# Patient Record
Sex: Male | Born: 1935 | Hispanic: No | Smoking: Never smoker
Health system: Southern US, Community
[De-identification: ages and names within clinical notes are randomized; demographics above are authoritative.]

## PROBLEM LIST (undated history)

## (undated) DIAGNOSIS — M1A9XX0 Chronic gout, unspecified, without tophus (tophi): Secondary | ICD-10-CM

## (undated) DIAGNOSIS — M353 Polymyalgia rheumatica: Secondary | ICD-10-CM

## (undated) DIAGNOSIS — I509 Heart failure, unspecified: Secondary | ICD-10-CM

## (undated) DIAGNOSIS — I10 Essential (primary) hypertension: Secondary | ICD-10-CM

## (undated) DIAGNOSIS — Z794 Long term (current) use of insulin: Secondary | ICD-10-CM

## (undated) DIAGNOSIS — E782 Mixed hyperlipidemia: Secondary | ICD-10-CM

## (undated) DIAGNOSIS — Z7952 Long term (current) use of systemic steroids: Secondary | ICD-10-CM

## (undated) DIAGNOSIS — E118 Type 2 diabetes mellitus with unspecified complications: Secondary | ICD-10-CM

## (undated) DIAGNOSIS — M170 Bilateral primary osteoarthritis of knee: Secondary | ICD-10-CM

## (undated) DIAGNOSIS — N184 Chronic kidney disease, stage 4 (severe): Secondary | ICD-10-CM

## (undated) DIAGNOSIS — E119 Type 2 diabetes mellitus without complications: Secondary | ICD-10-CM

## (undated) HISTORY — PX: ABDOMINAL SURGERY: SHX537

## (undated) HISTORY — DX: Polymyalgia rheumatica: M35.3

## (undated) HISTORY — DX: Mixed hyperlipidemia: E78.2

## (undated) HISTORY — DX: Long term (current) use of systemic steroids: Z79.52

## (undated) HISTORY — DX: Long term (current) use of insulin: Z79.4

## (undated) HISTORY — PX: CHOLECYSTECTOMY: SHX55

## (undated) HISTORY — DX: Chronic kidney disease, stage 4 (severe): N18.4

## (undated) HISTORY — DX: Chronic gout, unspecified, without tophus (tophi): M1A.9XX0

## (undated) HISTORY — DX: Bilateral primary osteoarthritis of knee: M17.0

## (undated) HISTORY — DX: Type 2 diabetes mellitus with unspecified complications: E11.8

## (undated) HISTORY — DX: Heart failure, unspecified: I50.9

## (undated) HISTORY — DX: Essential (primary) hypertension: I10

---

## 1999-10-08 ENCOUNTER — Ambulatory Visit: Admit: 1999-10-08 | Disposition: A | Discharge status: Home / Self Care | Payer: Self-pay | Source: Ambulatory Visit

## 2000-12-19 ENCOUNTER — Ambulatory Visit: Admit: 2000-12-19 | Disposition: A | Discharge status: Home / Self Care | Payer: Self-pay | Source: Ambulatory Visit

## 2002-08-08 ENCOUNTER — Ambulatory Visit
Admission: AD | Admit: 2002-08-08 | Disposition: A | Discharge status: Home / Self Care | Payer: Self-pay | Source: Ambulatory Visit

## 2005-08-26 ENCOUNTER — Ambulatory Visit
Admission: AD | Admit: 2005-08-26 | Disposition: A | Discharge status: Home / Self Care | Payer: Self-pay | Source: Ambulatory Visit

## 2005-08-29 LAB — COMPREHENSIVE METABOLIC PANEL
ALT: 17 U/L (ref 7–56)
AST (SGOT): 16 U/L (ref 5–40)
Albumin, Synovial: 4.2 g/dL (ref 3.9–5.0)
Alkaline Phosphatase: 92 U/L (ref 38–126)
BUN / Creatinine Ratio: 17 (ref 8–20)
BUN: 16 mg/dL (ref 6–20)
Bilirubin, Total: 0.2 mg/dL (ref 0.2–1.3)
CO2: 26 mmol/L (ref 21.0–31.0)
Calcium: 9.6 mg/dL (ref 8.4–10.2)
Chloride: 102 mmol/L (ref 101–111)
Creatinine: 0.99 mg/dL (ref 0.5–1.4)
EGFR: >60 mL/min/{1.73_m2}
EGFR: >60 mL/min/{1.73_m2}
Glucose: 189 mg/dL — ABNORMAL HIGH (ref 70–100)
Potassium: 4.6 mmol/L (ref 3.6–5.0)
Protein, Total: 8 g/dL (ref 6.3–8.2)
Sodium: 139 mmol/L (ref 135–145)

## 2005-08-29 LAB — URINALYSIS
Bilirubin, UA: NEGATIVE
Blood, UA: NEGATIVE
Glucose, UA: NEGATIVE
Ketones UA: NEGATIVE
Leukocyte Esterase, UA: NEGATIVE
Nitrate: NEGATIVE
Protein, UR: NEGATIVE
Specific Gravity, UR: 1.012 (ref 1.000–1.035)
Urobilinogen, UA: NORMAL
pH, Urine: 5.5 (ref 5.0–8.0)

## 2005-08-29 LAB — MICROALBUMIN, URINE, 24 HOUR
Creatinine, UR - mg/dL: 55 mg/dL
Microalbumin - mg/dL: 1.9 mg/dL
Microalbumin/Creatinine Ratio: 35 mg/g — ABNORMAL HIGH (ref 0–30)

## 2005-08-29 LAB — ^CBC WITH DIFF MCKESSON
BASOPHILS %: 0.5 % (ref 0–2)
Baso(Absolute): 0
Eosinophils %: 6.2 % — ABNORMAL HIGH (ref 0–6)
Eosinophils Absolute: 0.4
Hematocrit: 43 % (ref 39.0–49.0)
Hemoglobin: 14.6 g/dL (ref 13.2–17.3)
Lymphocytes Absolute: 2.4
Lymphocytes Relative: 34.8 % (ref 25–55)
MCH: 30.8 pg (ref 27.0–34.0)
MCHC: 34 % (ref 32.0–36.0)
MCV: 90.6 fL (ref 80–100)
Monocytes Absolute: 0.4
Monocytes Relative %: 5.8 % (ref 1–8)
Neutrophils Absolute: 3.6
Neutrophils Relative %: 52.7 % (ref 49–69)
Platelets: 220 10*3/uL (ref 150–400)
RBC: 4.74 /mm3 (ref 3.80–5.40)
RDW: 12.8 % (ref 11.0–14.0)
WBC: 6.8 10*3/uL (ref 4.8–10.8)

## 2005-08-29 LAB — ^LIPID PROFILE MCKESSON
Cholesterol: 172 mg/dL (ref 140–200)
Coronary Heart Disease Risk: 3.2 (ref 1.0–6.5)
HDL Cholesterol,  Direct: 54 mg/dL (ref 40–200)
LDL: 89 mg/dL (ref 66–178)
Triglycerides: 143 mg/dL — ABNORMAL HIGH (ref 27–125)
VLDL: 29 mg/dL (ref 2–38)

## 2005-08-29 LAB — ^GLYCOHEMOGLOBIN MCKESSON: Hemoglobin A1C: 11.6 % — ABNORMAL HIGH (ref 4.0–6.0)

## 2005-08-29 LAB — BILIRUBIN, DIRECT: Bilirubin Direct: 0 mg/dL (ref 0.0–0.4)

## 2005-08-29 LAB — SEDIMENTATION RATE, AUTOMATED: Sed Rate: 23 — ABNORMAL HIGH (ref 0–20)

## 2005-08-29 LAB — PROSTATE SPECIFIC ANTIGEN: Prostate Specific Antigen: 1 ng/mL (ref 0.0–4.0)

## 2005-08-29 LAB — ANA SCREEN ONLY

## 2005-08-29 LAB — RHEUMATOID FACTOR: Rheumatoid Factor: 11 IU/mL (ref 0–14)

## 2005-08-29 LAB — TSH, 3RD GENERATION: TSH, 3rd Generation: 0.474 mIU/L (ref 0.465–4.680)

## 2006-01-12 ENCOUNTER — Ambulatory Visit
Admission: AD | Admit: 2006-01-12 | Disposition: A | Discharge status: Home / Self Care | Payer: Self-pay | Source: Ambulatory Visit

## 2006-01-14 LAB — URINALYSIS
Bilirubin, UA: NEGATIVE
Blood, UA: NEGATIVE
Glucose, UA: NEGATIVE
Ketones UA: NEGATIVE
Leukocyte Esterase, UA: NEGATIVE
Nitrate: NEGATIVE
Protein, UR: NEGATIVE
Specific Gravity, UR: 1.011 (ref 1.000–1.035)
Urobilinogen, UA: NORMAL
pH, Urine: 6 (ref 5.0–8.0)

## 2006-01-14 LAB — ^LIPID PROFILE MCKESSON
Cholesterol: 163 mg/dL (ref 140–200)
Coronary Heart Disease Risk: 3.9 (ref 1.0–6.5)
HDL Cholesterol,  Direct: 42 mg/dL (ref 40–200)
LDL: 106 mg/dL (ref 66–178)
Triglycerides: 76 mg/dL (ref 27–125)
VLDL: 15 mg/dL (ref 2–38)

## 2006-01-14 LAB — COMPREHENSIVE METABOLIC PANEL
ALT: 15 U/L (ref 7–56)
AST (SGOT): 20 U/L (ref 5–40)
Albumin, Synovial: 3.9 g/dL (ref 3.9–5.0)
Alkaline Phosphatase: 73 U/L (ref 38–126)
BUN / Creatinine Ratio: 11 (ref 8–20)
BUN: 11 mg/dL (ref 6–20)
Bilirubin, Total: 0.1 mg/dL — ABNORMAL LOW (ref 0.2–1.3)
CO2: 24 mmol/L (ref 21.0–31.0)
Calcium: 9.6 mg/dL (ref 8.4–10.2)
Chloride: 106 mmol/L (ref 101–111)
Creatinine: 1.02 mg/dL (ref 0.5–1.4)
EGFR: >60 mL/min/{1.73_m2}
EGFR: >60 mL/min/{1.73_m2}
Glucose: 129 mg/dL — ABNORMAL HIGH (ref 70–100)
Potassium: 4 mmol/L (ref 3.6–5.0)
Protein, Total: 7.5 g/dL (ref 6.3–8.2)
Sodium: 141 mmol/L (ref 135–145)

## 2006-01-14 LAB — ^GLYCOHEMOGLOBIN MCKESSON: Hemoglobin A1C: 6.6 % — ABNORMAL HIGH (ref 4.0–6.0)

## 2006-01-14 LAB — PSA: Prostate Specific Antigen, Total: 1 ng/mL (ref 0.0–4.0)

## 2006-01-14 LAB — BILIRUBIN, DIRECT: Bilirubin Direct: 0 mg/dL (ref 0.0–0.4)

## 2006-05-31 ENCOUNTER — Ambulatory Visit
Admission: AD | Admit: 2006-05-31 | Disposition: A | Discharge status: Home / Self Care | Payer: Self-pay | Source: Ambulatory Visit

## 2006-06-02 LAB — ^CBC WITH DIFF MCKESSON
BASOPHILS %: 0.4 % (ref 0–2)
Baso(Absolute): 0
Eosinophils %: 9.7 % — ABNORMAL HIGH (ref 0–6)
Eosinophils Absolute: 0.7
Hematocrit: 39.8 % (ref 39.0–49.0)
Hemoglobin: 13.9 g/dL (ref 13.2–17.3)
Lymphocytes Absolute: 2.1
Lymphocytes Relative: 29.5 % (ref 25–55)
MCH: 31.1 pg (ref 27.0–34.0)
MCHC: 34.8 % (ref 32.0–36.0)
MCV: 89.4 fL (ref 80–100)
Monocytes Absolute: 0.3
Monocytes Relative %: 3.9 % (ref 1–8)
Neutrophils Absolute: 4.1
Neutrophils Relative %: 56.5 % (ref 49–69)
Platelets: 161 10*3/uL (ref 150–400)
RBC: 4.45 /mm3 (ref 3.80–5.40)
RDW: 12.2 % (ref 11.0–14.0)
WBC: 7.2 10*3/uL (ref 4.8–10.8)

## 2006-06-02 LAB — URINALYSIS
Bilirubin, UA: NEGATIVE
Blood, UA: NEGATIVE
Ketones UA: NEGATIVE
Leukocyte Esterase, UA: NEGATIVE
Nitrate: NEGATIVE
Protein, UR: NEGATIVE
Specific Gravity, UR: 1.021 (ref 1.000–1.035)
Urobilinogen, UA: NORMAL
pH, Urine: 6 (ref 5.0–8.0)

## 2006-06-02 LAB — COMPREHENSIVE METABOLIC PANEL
ALT: 29 U/L (ref 7–56)
AST (SGOT): 30 U/L (ref 5–40)
Albumin, Synovial: 3.9 g/dL (ref 3.9–5.0)
Alkaline Phosphatase: 102 U/L (ref 38–126)
BUN / Creatinine Ratio: 21 — ABNORMAL HIGH (ref 8–20)
BUN: 20 mg/dL (ref 6–20)
Bilirubin, Total: <0.1 mg/dL — ABNORMAL LOW (ref 0.2–1.3)
CO2: 26 mmol/L (ref 21.0–31.0)
Calcium: 9 mg/dL (ref 8.4–10.2)
Chloride: 98 mmol/L — ABNORMAL LOW (ref 101–111)
Creatinine: 1 mg/dL (ref 0.5–1.4)
EGFR: >60 mL/min/{1.73_m2}
EGFR: >60 mL/min/{1.73_m2}
Glucose: 282 mg/dL — ABNORMAL HIGH (ref 70–100)
Potassium: 4.1 mmol/L (ref 3.6–5.0)
Protein, Total: 7.2 g/dL (ref 6.3–8.2)
Sodium: 135 mmol/L (ref 135–145)

## 2006-06-02 LAB — MICROALBUMIN, URINE, 24 HOUR
Creatinine, UR - mg/dL: 54 mg/dL
Microalbumin - mg/dL: 3.5 mg/dL
Microalbumin/Creatinine Ratio: 65 mg/g — ABNORMAL HIGH (ref 0–30)

## 2006-06-02 LAB — ^LIPID PROFILE MCKESSON
Cholesterol: 113 mg/dL — ABNORMAL LOW (ref 140–200)
Coronary Heart Disease Risk: 3 (ref 1.0–6.5)
HDL Cholesterol,  Direct: 42 mg/dL (ref 40–200)
LDL: 45 mg/dL — ABNORMAL LOW (ref 66–178)
Triglycerides: 125 mg/dL (ref 27–125)
VLDL: 25 mg/dL (ref 2–38)

## 2006-06-02 LAB — PROSTATE SPECIFIC ANTIGEN: Prostate Specific Antigen: 0.9 ng/mL (ref 0.0–4.0)

## 2006-06-02 LAB — ^GLYCOHEMOGLOBIN MCKESSON: Hemoglobin A1C: 9.7 % — ABNORMAL HIGH (ref 4.0–6.0)

## 2006-06-02 LAB — BILIRUBIN, DIRECT

## 2015-02-18 ENCOUNTER — Emergency Department (HOSPITAL_BASED_OUTPATIENT_CLINIC_OR_DEPARTMENT_OTHER): Payer: Medicare Other

## 2015-02-18 ENCOUNTER — Encounter: Payer: Self-pay | Admitting: Cardiology

## 2015-02-18 ENCOUNTER — Encounter (HOSPITAL_BASED_OUTPATIENT_CLINIC_OR_DEPARTMENT_OTHER): Payer: Self-pay | Admitting: Emergency Medicine

## 2015-02-18 ENCOUNTER — Inpatient Hospital Stay (HOSPITAL_BASED_OUTPATIENT_CLINIC_OR_DEPARTMENT_OTHER)
Admission: EM | Admit: 2015-02-18 | Discharge: 2015-02-21 | DRG: 309 | Disposition: A | Discharge status: Home / Self Care | Payer: Medicare Other | Attending: Cardiovascular Disease | Admitting: Cardiovascular Disease

## 2015-02-18 DIAGNOSIS — E1129 Type 2 diabetes mellitus with other diabetic kidney complication: Secondary | ICD-10-CM | POA: Insufficient documentation

## 2015-02-18 DIAGNOSIS — Z79899 Other long term (current) drug therapy: Secondary | ICD-10-CM | POA: Diagnosis not present

## 2015-02-18 DIAGNOSIS — I248 Other forms of acute ischemic heart disease: Secondary | ICD-10-CM | POA: Diagnosis present

## 2015-02-18 DIAGNOSIS — M25472 Effusion, left ankle: Secondary | ICD-10-CM

## 2015-02-18 DIAGNOSIS — M109 Gout, unspecified: Secondary | ICD-10-CM | POA: Diagnosis present

## 2015-02-18 DIAGNOSIS — R7989 Other specified abnormal findings of blood chemistry: Secondary | ICD-10-CM

## 2015-02-18 DIAGNOSIS — E440 Moderate protein-calorie malnutrition: Secondary | ICD-10-CM | POA: Diagnosis present

## 2015-02-18 DIAGNOSIS — E785 Hyperlipidemia, unspecified: Secondary | ICD-10-CM | POA: Diagnosis not present

## 2015-02-18 DIAGNOSIS — R5383 Other fatigue: Secondary | ICD-10-CM

## 2015-02-18 DIAGNOSIS — M1990 Unspecified osteoarthritis, unspecified site: Secondary | ICD-10-CM | POA: Diagnosis present

## 2015-02-18 DIAGNOSIS — E0821 Diabetes mellitus due to underlying condition with diabetic nephropathy: Secondary | ICD-10-CM | POA: Diagnosis not present

## 2015-02-18 DIAGNOSIS — I5032 Chronic diastolic (congestive) heart failure: Secondary | ICD-10-CM | POA: Diagnosis not present

## 2015-02-18 DIAGNOSIS — N183 Chronic kidney disease, stage 3 unspecified: Secondary | ICD-10-CM | POA: Insufficient documentation

## 2015-02-18 DIAGNOSIS — Z6826 Body mass index (BMI) 26.0-26.9, adult: Secondary | ICD-10-CM | POA: Diagnosis not present

## 2015-02-18 DIAGNOSIS — E7800 Pure hypercholesterolemia, unspecified: Secondary | ICD-10-CM | POA: Diagnosis not present

## 2015-02-18 DIAGNOSIS — I130 Hypertensive heart and chronic kidney disease with heart failure and stage 1 through stage 4 chronic kidney disease, or unspecified chronic kidney disease: Secondary | ICD-10-CM | POA: Diagnosis present

## 2015-02-18 DIAGNOSIS — I442 Atrioventricular block, complete: Secondary | ICD-10-CM | POA: Diagnosis present

## 2015-02-18 DIAGNOSIS — I441 Atrioventricular block, second degree: Secondary | ICD-10-CM

## 2015-02-18 DIAGNOSIS — E1122 Type 2 diabetes mellitus with diabetic chronic kidney disease: Secondary | ICD-10-CM | POA: Diagnosis not present

## 2015-02-18 DIAGNOSIS — Z87891 Personal history of nicotine dependence: Secondary | ICD-10-CM | POA: Diagnosis not present

## 2015-02-18 DIAGNOSIS — R778 Other specified abnormalities of plasma proteins: Secondary | ICD-10-CM | POA: Diagnosis present

## 2015-02-18 DIAGNOSIS — I10 Essential (primary) hypertension: Secondary | ICD-10-CM | POA: Insufficient documentation

## 2015-02-18 DIAGNOSIS — Z0189 Encounter for other specified special examinations: Secondary | ICD-10-CM

## 2015-02-18 DIAGNOSIS — I200 Unstable angina: Secondary | ICD-10-CM | POA: Insufficient documentation

## 2015-02-18 DIAGNOSIS — Z7984 Long term (current) use of oral hypoglycemic drugs: Secondary | ICD-10-CM

## 2015-02-18 DIAGNOSIS — R079 Chest pain, unspecified: Secondary | ICD-10-CM | POA: Diagnosis present

## 2015-02-18 DIAGNOSIS — R001 Bradycardia, unspecified: Secondary | ICD-10-CM

## 2015-02-18 HISTORY — DX: Type 2 diabetes mellitus without complications: E11.9

## 2015-02-18 HISTORY — DX: Essential (primary) hypertension: I10

## 2015-02-18 LAB — URINALYSIS, ROUTINE W REFLEX MICROSCOPIC
Bilirubin Urine: NEGATIVE
GLUCOSE, UA: 100 mg/dL — AB
HGB URINE DIPSTICK: NEGATIVE
KETONES UR: NEGATIVE mg/dL
Leukocytes, UA: NEGATIVE
Nitrite: NEGATIVE
PROTEIN: 100 mg/dL — AB
Specific Gravity, Urine: 1.01 (ref 1.005–1.030)
pH: 7 (ref 5.0–8.0)

## 2015-02-18 LAB — DIFFERENTIAL
BASOS ABS: 0 10*3/uL (ref 0.0–0.1)
Basophils Relative: 0 %
EOS ABS: 1.2 10*3/uL — AB (ref 0.0–0.7)
Eosinophils Relative: 11 %
LYMPHS ABS: 1.7 10*3/uL (ref 0.7–4.0)
Lymphocytes Relative: 16 %
MONOS PCT: 9 %
Monocytes Absolute: 1 10*3/uL (ref 0.1–1.0)
NEUTROS ABS: 6.7 10*3/uL (ref 1.7–7.7)
Neutrophils Relative %: 64 %

## 2015-02-18 LAB — BASIC METABOLIC PANEL
Anion gap: 6 (ref 5–15)
BUN: 17 mg/dL (ref 6–20)
CHLORIDE: 102 mmol/L (ref 101–111)
CO2: 27 mmol/L (ref 22–32)
Calcium: 8.7 mg/dL — ABNORMAL LOW (ref 8.9–10.3)
Creatinine, Ser: 1.63 mg/dL — ABNORMAL HIGH (ref 0.61–1.24)
GFR calc Af Amer: 42 mL/min — ABNORMAL LOW (ref >60)
GFR calc non Af Amer: 37 mL/min — ABNORMAL LOW (ref >60)
Glucose, Bld: 237 mg/dL — ABNORMAL HIGH (ref 65–99)
POTASSIUM: 4.2 mmol/L (ref 3.5–5.1)
SODIUM: 135 mmol/L (ref 135–145)

## 2015-02-18 LAB — CBC
HEMATOCRIT: 38.2 % — AB (ref 39.0–52.0)
Hemoglobin: 12.7 g/dL — ABNORMAL LOW (ref 13.0–17.0)
MCH: 29.9 pg (ref 26.0–34.0)
MCHC: 33.2 g/dL (ref 30.0–36.0)
MCV: 89.9 fL (ref 78.0–100.0)
Platelets: 211 10*3/uL (ref 150–400)
RBC: 4.25 MIL/uL (ref 4.22–5.81)
RDW: 11.4 % — AB (ref 11.5–15.5)
WBC: 10.5 10*3/uL (ref 4.0–10.5)

## 2015-02-18 LAB — URINE MICROSCOPIC-ADD ON
BACTERIA UA: NONE SEEN
WBC, UA: NONE SEEN WBC/hpf (ref 0–5)

## 2015-02-18 LAB — CBG MONITORING, ED: Glucose-Capillary: 221 mg/dL — ABNORMAL HIGH (ref 65–99)

## 2015-02-18 LAB — MAGNESIUM: MAGNESIUM: 1.5 mg/dL — AB (ref 1.7–2.4)

## 2015-02-18 LAB — BRAIN NATRIURETIC PEPTIDE: B Natriuretic Peptide: 245.7 pg/mL — ABNORMAL HIGH (ref 0.0–100.0)

## 2015-02-18 LAB — GLUCOSE, CAPILLARY: GLUCOSE-CAPILLARY: 260 mg/dL — AB (ref 65–99)

## 2015-02-18 LAB — TROPONIN I: Troponin I: 0.04 ng/mL — ABNORMAL HIGH (ref <0.031)

## 2015-02-18 MED ORDER — ASPIRIN 81 MG PO CHEW
324.0000 mg | CHEWABLE_TABLET | Freq: Once | ORAL | Status: AC
  Administered 2015-02-18: 324 mg via ORAL
  Filled 2015-02-18: qty 4

## 2015-02-18 MED ORDER — HEPARIN (PORCINE) IN NACL 100-0.45 UNIT/ML-% IJ SOLN
1150.0000 [IU]/h | INTRAMUSCULAR | Status: DC
  Administered 2015-02-18: 800 [IU]/h via INTRAVENOUS
  Administered 2015-02-19: 950 [IU]/h via INTRAVENOUS
  Filled 2015-02-18 (×2): qty 250

## 2015-02-18 MED ORDER — SIMVASTATIN 40 MG PO TABS
40.0000 mg | ORAL_TABLET | Freq: Every day | ORAL | Status: DC
  Administered 2015-02-19 – 2015-02-20 (×2): 40 mg via ORAL
  Filled 2015-02-18 (×3): qty 1

## 2015-02-18 MED ORDER — INSULIN ASPART 100 UNIT/ML ~~LOC~~ SOLN
0.0000 [IU] | Freq: Every day | SUBCUTANEOUS | Status: DC
Start: 2015-02-18 — End: 2015-02-20
  Administered 2015-02-18: 3 [IU] via SUBCUTANEOUS
  Administered 2015-02-19: 2 [IU] via SUBCUTANEOUS

## 2015-02-18 MED ORDER — INSULIN ASPART 100 UNIT/ML ~~LOC~~ SOLN
0.0000 [IU] | Freq: Three times a day (TID) | SUBCUTANEOUS | Status: DC
  Administered 2015-02-19: 2 [IU] via SUBCUTANEOUS
  Administered 2015-02-19 (×2): 3 [IU] via SUBCUTANEOUS

## 2015-02-18 MED ORDER — HYDRALAZINE HCL 20 MG/ML IJ SOLN
10.0000 mg | Freq: Once | INTRAMUSCULAR | Status: AC
  Administered 2015-02-19: 10 mg via INTRAVENOUS
  Filled 2015-02-18: qty 1

## 2015-02-18 MED ORDER — LISINOPRIL 20 MG PO TABS
20.0000 mg | ORAL_TABLET | Freq: Every day | ORAL | Status: DC
  Administered 2015-02-18: 20 mg via ORAL
  Filled 2015-02-18: qty 1

## 2015-02-18 MED ORDER — HEPARIN BOLUS VIA INFUSION
4000.0000 [IU] | Freq: Once | INTRAVENOUS | Status: AC
  Administered 2015-02-18: 4000 [IU] via INTRAVENOUS
  Filled 2015-02-18: qty 4000

## 2015-02-18 MED ORDER — ASPIRIN EC 81 MG PO TBEC
81.0000 mg | DELAYED_RELEASE_TABLET | Freq: Every day | ORAL | Status: DC
  Administered 2015-02-19 – 2015-02-21 (×3): 81 mg via ORAL
  Filled 2015-02-18 (×3): qty 1

## 2015-02-18 MED ORDER — FENTANYL CITRATE (PF) 100 MCG/2ML IJ SOLN
25.0000 ug | Freq: Once | INTRAMUSCULAR | Status: AC
  Administered 2015-02-18: 25 ug via INTRAVENOUS
  Filled 2015-02-18: qty 2

## 2015-02-18 NOTE — H&P (Signed)
Patient ID: Terrance Usery MRN: [id number], DOB/AGE: 79-21-1928   Admit date: 02/18/2015   Primary Physician: No primary care provider on file. Primary Cardiologist: None  HPI: Mr. Sharron is an 81 y.o. [place] male with a history of DM, HTN, gout and high cholesterol who presented to [facility] complaining of intermittent, moderate bilateral lower chest pain that occurs with unproductive cough and started 2 weeks ago. Information was collected from caregivers and from the patient with the assistance of phone interpreter. His granddaughter states dizziness and SOB that occurs with walking as associated symptoms. She also notes baseline bilateral leg swelling that becomes worse at night and is unchanged today. Pt's wife tried coining his back with no relief [ritual where coins are rubbed on the skin]. Pt currently takes Lisinopril, Metformin and Simvastatin. He quit smoking 30 years ago after he was hospitalized for an infection that he felt was related to his smoking habit; he started at age 32 and rolled his cigarettes in [place] but smoked 1 pack/day here in the [place]. Pt's granddaughter denies a history of MI or CAD, and patient denies history of CVA. 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 He currently lives in [place] though was sent here for medical care. EKG in ED showed heart block. Troponin 0.04. Pt was transferred to [facility] for further evaluation.    Problem List: Past Medical History  Diagnosis Date  . Diabetes mellitus without complication (HCC)   . Hypertension     Past Surgical History  Procedure Laterality Date  . Abdominal surgery       Allergies: Allergies not on file   Home Medications Prior to Admission medications   Medication Sig Start Date End Date Taking? Authorizing Provider  lisinopril (PRINIVIL,ZESTRIL) 20 MG tablet Take 20 mg by mouth daily.   Yes Historical Provider, MD  metFORMIN (GLUCOPHAGE) 1000  MG tablet Take 1,000 mg by mouth 2 (two) times daily with a meal.   Yes Historical Provider, MD  simvastatin (ZOCOR) 10 MG tablet Take 10 mg by mouth daily.   Yes Historical Provider, MD     FM Hx: Unremarkable for early CAD   Social History   Social History  . Marital Status: Married    Spouse Name: N/A  . Number of Children: N/A  . Years of Education: N/A   Occupational History  . Not on file.   Social History Main Topics  . Smoking status: Never Smoker   . Smokeless tobacco: Not on file  . Alcohol Use: Not on file  . Drug Use: Not on file  . Sexual Activity: Not on file   Other Topics Concern  . Not on file   Social History Narrative  . No narrative on file     Review of Systems: General: negative for chills, fever, night sweats or weight changes.  Cardiovascular: negative for orthopnea, palpitations, paroxysmal nocturnal dyspnea  HEENT: negative for any visual disturbances, blindness, glaucoma Dermatological: LLE with two patches of dry, scaly lesions Respiratory: negative for cough, hemoptysis, or wheezing Urologic: negative for hematuria or dysuria Abdominal: negative for nausea, vomiting, diarrhea, bright red blood per rectum, melena, or hematemesis Neurologic: dizziness, negative for visual changes, syncope Musculoskeletal: L knee pain, negative for back pain, joint pain, or swelling Psych: cooperative and appropriate All other systems reviewed and are otherwise negative except as noted above and in the HPI.  Physical Exam: Blood pressure  215/62, pulse 58, temperature 99.8 F (37.7 C), temperature source Oral, resp. rate 24, SpO2 97 %.   General: elderly Caucasian male, resting in bed, NAD HEENT: PERRL, EOMI, no scleral icterus, oropharynx clear, mild JVD Cardiac: borderline regular rate/sinus bradycardia, no rubs, murmurs or gallops Pulm: clear to auscultation bilaterally, no wheezes, rales, or rhonchi Abd: soft, nontender, nondistended, BS present,  peri-umbilical scar noted from prior procedure [stone removal] Ext: warm and well perfused, no pedal edema, 1+ tibial edema, dry, white, scaly rash noted overlying L medial malleolus and underneath his knee Neuro: responds to questions appropriately; moving all extremities freely     Labs:   Results for orders placed or performed during the hospital encounter of 02/18/15 (from the past 24 hour(s))  Basic metabolic panel     Status: Abnormal   Collection Time: 02/18/15  6:28 PM  Result Value Ref Range   Sodium 135 135 - 145 mmol/L   Potassium 4.2 3.5 - 5.1 mmol/L   Chloride 102 101 - 111 mmol/L   CO2 27 22 - 32 mmol/L   Glucose, Bld 237 (H) 65 - 99 mg/dL   BUN 17 6 - 20 mg/dL   Creatinine, Ser 1.611.63 (H) 0.61 - 1.24 mg/dL   Calcium 8.7 (L) 8.9 - 10.3 mg/dL   GFR calc non Af Amer 37 (L) >60 mL/min   GFR calc Af Amer 42 (L) >60 mL/min   Anion gap 6 5 - 15  CBC     Status: Abnormal   Collection Time: 02/18/15  6:28 PM  Result Value Ref Range   WBC 10.5 4.0 - 10.5 K/uL   RBC 4.25 4.22 - 5.81 MIL/uL   Hemoglobin 12.7 (L) 13.0 - 17.0 g/dL   HCT 09.638.2 (L) 04.539.0 - 40.952.0 %   MCV 89.9 78.0 - 100.0 fL   MCH 29.9 26.0 - 34.0 pg   MCHC 33.2 30.0 - 36.0 g/dL   RDW 81.111.4 (L) 91.411.5 - 78.215.5 %   Platelets 211 150 - 400 K/uL  Troponin I     Status: Abnormal   Collection Time: 02/18/15  6:28 PM  Result Value Ref Range   Troponin I 0.04 (H) <0.031 ng/mL  Differential     Status: Abnormal   Collection Time: 02/18/15  6:28 PM  Result Value Ref Range   Neutrophils Relative % 64 %   Neutro Abs 6.7 1.7 - 7.7 K/uL   Lymphocytes Relative 16 %   Lymphs Abs 1.7 0.7 - 4.0 K/uL   Monocytes Relative 9 %   Monocytes Absolute 1.0 0.1 - 1.0 K/uL   Eosinophils Relative 11 %   Eosinophils Absolute 1.2 (H) 0.0 - 0.7 K/uL   Basophils Relative 0 %   Basophils Absolute 0.0 0.0 - 0.1 K/uL  CBG monitoring, ED     Status: Abnormal   Collection Time: 02/18/15  6:28 PM  Result Value Ref Range   Glucose-Capillary 221  (H) 65 - 99 mg/dL     Radiology/Studies: Dg Chest 2 View  02/18/2015  CLINICAL DATA:  Cough.  Chest pain.  Bilateral ankle swelling. EXAM: CHEST  2 VIEW COMPARISON:  None. FINDINGS: Atherosclerotic aortic arch. Mild enlargement of the cardiopericardial silhouette. No edema. The lungs appear clear.  No pleural effusion. IMPRESSION: 1. Mild enlargement of the cardiopericardial silhouette, without edema. 2. Atherosclerotic aortic arch. Electronically Signed   By: Gaylyn RongWalter  Liebkemann M.D.   On: 02/18/2015 19:13    EKG: Second-degree, Type 1 heart block.   ASSESSMENT AND PLAN:  Active Problems:   Unstable angina (HCC)   Diabetes mellitus with renal manifestation (HCC)   Renal insufficiency   Essential hypertension   Elevated troponin level   Complete heart block (HCC)   PLAN:  Mr. Lagrand is an 80 year old. [place] male with a history of DM, HTN, gout and high cholesterol found to have second-degree Type 1 heart block.  Second-degree Type 1 heart block: As noted on admission EKG though repeat this AM shows sinus rhythm, normal rate. Monitor this AM does show that HR in upper 50s-low 60s which is borderline bradycardia. TSH normal. Mg low. Suspect age-related fibrosis around AV node.  -Replete Mg 1g given impaired renal function and recheck  -Check echo today to assess cardiac function -Continue ASA   Poorly controlled HTN: Systolic BP in the 200s. Home meds include lisinopril . Received hydralazine IV overnight. -Continue lisinopril -Start Norvasc  and hydralazine  TID  Hyperlipidemia: No lipid panel on file. Home meds include simvastatin . -Continue simvastatin   Type 2 diabetes: No A1c on file. CBGs trending 200s.  -Continue SSI-S    Signed, Heywood Iles, PGY2 Internal Medicine Pager: [phone]  02/19/2015, 9:16 AM  Personally seen and examined. Agree with above. Multitude of symptoms ranging from possible chest pain, leg pain, vomiting. Minimally  elevated troponin likely demand ischemia in the setting of highly elevated blood pressure. EKG demonstrates AV block, first-degree as well as second-degree AV block 1. EKGs do not demonstrate complete heart block. He is not on any AV nodal blocking agents. Avoid. He has not had any syncopal episodes. Leg swelling was one of the main complaints. 1+ edema noted. We will give him Lasix. This will also help with his hypertension. Check echocardiogram. There is no clear indication for pacemaker at this time.  Donato Schultz, MD

## 2015-02-18 NOTE — ED Notes (Signed)
Pt transferred to [facility] via [facility] condition stable

## 2015-02-18 NOTE — Progress Notes (Signed)
ANTICOAGULATION CONSULT NOTE - Initial Consult  Pharmacy Consult for heparin IV Indication: ACS/STEMI  No Known Allergies  Patient Measurements: Height: 5\' 3"  (160 cm) Weight: 150 lb 14.4 oz (68.448 kg) IBW/kg (Calculated) : 56.9 Heparin Dosing Weight: 68.5 kg Vital Signs: Temp: 98.4 F (36.9 C) (11/15 2100) Temp Source: Oral (11/15 2100) BP: 180/80 mmHg (11/15 2244) Pulse Rate: 45 (11/15 2100)  Labs:  Recent Labs  02/18/15 1828  HGB 12.7*  HCT 38.2*  PLT 211  CREATININE 1.63*  TROPONINI 0.04*    Estimated Creatinine Clearance: 28.3 mL/min (by C-G formula based on Cr of 1.63).   Medical History: Past Medical History  Diagnosis Date  . Diabetes mellitus without complication (HCC)   . Hypertension     Medications:  Prescriptions prior to admission  Medication Sig Dispense Refill Last Dose  . lisinopril (PRINIVIL,ZESTRIL) 20 MG tablet Take 20 mg by mouth daily.   02/18/2015 at Unknown time  . metFORMIN (GLUCOPHAGE) 1000 MG tablet Take 1,000 mg by mouth 2 (two) times daily with a meal.   02/18/2015 at Unknown time  . Naproxen Sodium (ALEVE) 220 MG CAPS Take 220 mg by mouth every 8 (eight) hours as needed (pain).   02/17/2015 at Unknown time  . simvastatin (ZOCOR) 10 MG tablet Take 10 mg by mouth daily.   02/18/2015 at Unknown time   Scheduled:  . [START ON 02/19/2015] aspirin EC  81 mg Oral Daily  . hydrALAZINE  10 mg Intravenous Once  . insulin aspart  0-5 Units Subcutaneous QHS  . [START ON 02/19/2015] insulin aspart  0-9 Units Subcutaneous TID WC  . [START ON 02/19/2015] lisinopril  20 mg Oral Daily  . [START ON 02/19/2015] simvastatin  40 mg Oral q1800    Assessment: 81 y.o male who [facility] complaining of intermittent, moderate bilateral lower chest pain that occurs with unproductive cough and started 2 weeks ago. Pt's granddaughter denies a history of MI or CAD. EKG in ED showed heart block. Troponin 0.04. Pt was transferred to [facility]  for further evaluation. Pharmacy consulted to start IV heparin drip for ACS/STEMI. He was not on anticoagulation PTA. Hgb 12, pltc 211K.  No bleeding reported  Goal of Therapy:  Heparin level 0.3-0.7 units/ml Monitor platelets by anticoagulation protocol: Yes   Plan:  Heparin bolus 4000 units IV x1 Heparin drip at 800 units/hr Heparin level 8 hours after heparin bolus/drip started. Daily heparin level and CBC  Thank you for allowing pharmacy to be part of this patients care team. Noah Delaineuth Muna Demers, RPh Clinical Pharmacist Pager: [phone] 02/18/2015,10:46 PM

## 2015-02-18 NOTE — ED Notes (Signed)
Patient transported to X-ray and returned 

## 2015-02-18 NOTE — ED Notes (Signed)
MD at bedside. 

## 2015-02-18 NOTE — ED Provider Notes (Signed)
CSN: [id number]     Arrival date & time 02/18/15  1759 History  By signing my name below, I, Gwenyth Oberatherine Macek, attest that this documentation has been prepared under the direction and in the presence of Alvira MondayErin Taaj Hurlbut, MD.  Electronically Signed: Gwenyth Oberatherine Macek, ED Scribe. 02/18/2015. 6:49 PM.   Chief Complaint  Patient presents with  . Chest Pain   Patient is a 81 y.o. male presenting with chest pain. The history is provided by the patient and a relative. Language interpreter used: languange interpreter used on reevaluation.  Chest Pain Pain location:  L chest and R chest Pain severity:  Moderate Duration:  2 weeks Timing:  Intermittent Progression:  Waxing and waning Chronicity:  New Context comment:  Coughing Worsened by:  Coughing Ineffective treatments: traditional cambodian. Associated symptoms: cough, dizziness, fatigue, nausea and shortness of breath   Associated symptoms: no abdominal pain, no back pain, no fever, no headache, no numbness, not vomiting and no weakness   Risk factors: diabetes mellitus, high cholesterol and hypertension   Risk factors: no smoking     HPI Comments: Tyler Ray is a 81 y.o. male with a history of DM, HTN, gout and high cholesterol who presents to the Emergency Department complaining of intermittent, moderate bilateral lower chest pain that occurs with unproductive cough and started 2 weeks ago. His granddaughter states dizziness and SOB that occurs with walking as associated symptoms. She also notes baseline bilateral leg swelling that becomes worse at night and is unchanged today. Pt's wife tried coining his back with no relief. Pt currently takes Lisinopril, Metformin and Simvastatin. He does not smoke cigarettes. Pt's granddaughter denies a history of MI or CAD. She also denies fever, diaphoresis as associated symptoms.   Past Medical History  Diagnosis Date  . Diabetes mellitus without complication (HCC)   . Hypertension    Past Surgical  History  Procedure Laterality Date  . Abdominal surgery     History reviewed. No pertinent family history. Social History  Substance Use Topics  . Smoking status: Never Smoker   . Smokeless tobacco: None  . Alcohol Use: None   Review of Systems  Constitutional: Positive for fatigue. Negative for fever.  HENT: Negative for sore throat.   Eyes: Negative for visual disturbance.  Respiratory: Positive for cough and shortness of breath.   Cardiovascular: Positive for chest pain and leg swelling.  Gastrointestinal: Positive for nausea. Negative for vomiting, abdominal pain and diarrhea.  Genitourinary: Negative for difficulty urinating.  Musculoskeletal: Negative for back pain and neck stiffness.  Skin: Negative for rash.  Neurological: Positive for dizziness and light-headedness. Negative for syncope, weakness, numbness and headaches.  All other systems reviewed and are negative.  Allergies  Review of patient's allergies indicates no known allergies.  Home Medications   Prior to Admission medications   Medication Sig Start Date End Date Taking? Authorizing Provider  lisinopril (PRINIVIL,ZESTRIL) 20 MG tablet Take 20 mg by mouth daily.   Yes Historical Provider, MD  metFORMIN (GLUCOPHAGE) 1000 MG tablet Take 1,000 mg by mouth 2 (two) times daily with a meal.   Yes Historical Provider, MD  Naproxen Sodium (ALEVE) 220 MG CAPS Take 220 mg by mouth every 8 (eight) hours as needed (pain).   Yes Historical Provider, MD  simvastatin (ZOCOR) 10 MG tablet Take 10 mg by mouth daily.   Yes Historical Provider, MD   BP 223/71 mmHg  Pulse 54  Temp(Src) 98.2 F (36.8 C) (Oral)  Resp 16  Ht 5\' 3"  (  1.6 m)  Wt 150 lb 14.4 oz (68.448 kg)  BMI 26.74 kg/m2  SpO2 99% Physical Exam  Constitutional: He is oriented to person, place, and time. He appears well-developed and well-nourished. No distress.  HENT:  Head: Normocephalic and atraumatic.  Eyes: Conjunctivae and EOM are normal.  Neck: Neck  supple. No tracheal deviation present.  Cardiovascular: An irregular rhythm present. Bradycardia present.   Pulmonary/Chest: Effort normal. No respiratory distress.  Abdominal: Soft. He exhibits no distension. There is no tenderness. There is no guarding.  Musculoskeletal: He exhibits edema (bilateral 1+).  Neurological: He is alert and oriented to person, place, and time.  Skin: Skin is warm and dry.  Psychiatric: He has a normal mood and affect. His behavior is normal.  Nursing note and vitals reviewed.   ED Course  Procedures  DIAGNOSTIC STUDIES: Oxygen Saturation is 96% on RA, normal by my interpretation.    COORDINATION OF CARE: 6:46 PM Discussed abnormal rhythm and treatment plan with pt and family at bedside. They agreed to plan.  7:37 PM Using the interpreter phone, discussed transfer with pt. Pt agreed to plan.  Labs Review Labs Reviewed  BASIC METABOLIC PANEL - Abnormal; Notable for the following:    Glucose, Bld 237 (*)    Creatinine, Ser 1.63 (*)    Calcium 8.7 (*)    GFR calc non Af Amer 37 (*)    GFR calc Af Amer 42 (*)    All other components within normal limits  CBC - Abnormal; Notable for the following:    Hemoglobin 12.7 (*)    HCT 38.2 (*)    RDW 11.4 (*)    All other components within normal limits  TROPONIN I - Abnormal; Notable for the following:    Troponin I 0.04 (*)    All other components within normal limits  DIFFERENTIAL - Abnormal; Notable for the following:    Eosinophils Absolute 1.2 (*)    All other components within normal limits  BRAIN NATRIURETIC PEPTIDE - Abnormal; Notable for the following:    B Natriuretic Peptide 245.7 (*)    All other components within normal limits  MAGNESIUM - Abnormal; Notable for the following:    Magnesium 1.5 (*)    All other components within normal limits  GLUCOSE, CAPILLARY - Abnormal; Notable for the following:    Glucose-Capillary 260 (*)    All other components within normal limits  URINALYSIS,  ROUTINE W REFLEX MICROSCOPIC (NOT AT [facility]) - Abnormal; Notable for the following:    Glucose, UA 100 (*)    Protein, ur 100 (*)    All other components within normal limits  URINE MICROSCOPIC-ADD ON - Abnormal; Notable for the following:    Squamous Epithelial / LPF 0-5 (*)    All other components within normal limits  CBG MONITORING, ED - Abnormal; Notable for the following:    Glucose-Capillary 221 (*)    All other components within normal limits  MRSA PCR SCREENING  TSH  HEMOGLOBIN A1C  BASIC METABOLIC PANEL  CBC WITH DIFFERENTIAL/PLATELET  PROTIME-INR  HEPARIN LEVEL (UNFRACTIONATED)    Imaging Review Dg Chest 2 View  02/18/2015  CLINICAL DATA:  Cough.  Chest pain.  Bilateral ankle swelling. EXAM: CHEST  2 VIEW COMPARISON:  None. FINDINGS: Atherosclerotic aortic arch. Mild enlargement of the cardiopericardial silhouette. No edema. The lungs appear clear.  No pleural effusion. IMPRESSION: 1. Mild enlargement of the cardiopericardial silhouette, without edema. 2. Atherosclerotic aortic arch. Electronically Signed   By: Zollie Beckers  Ova Freshwater M.D.   On: 02/18/2015 19:13   I have personally reviewed and evaluated these images and lab results as part of my medical decision-making.   EKG Interpretation   Date/Time:  Tuesday February 18 2015 18:09:46 EST Ventricular Rate:  50 PR Interval:  268 QRS Duration: 120 QT Interval:  488 QTC Calculation: 444 R Axis:   -60 Text Interpretation:  Sinus bradycardia with second degree heart block  Pulmonary disease pattern Left anterior fascicular block Moderate voltage  criteria for LVH, may be normal variant Abnormal ECG Confirmed by  [facility] MD, Orell Hurtado ([id number]) on 02/19/2015 1:45:43 AM      MDM   Final diagnoses:  Heart block AV second degree  Elevated troponin  Essential hypertension  Bradycardia    28 her old male with a history of hypertension, hyperlipidemia, diabetes, gout presents with concern of 2 weeks of dyspnea on exertion,  bilateral lower chest pain with cough, lightheadedness, and bilateral lower extremity edema.  On arrival to the emergency department, patient is noted to be in second-degree heart block with heart rates between 39 2 upper 50s. He is hypertensive up to 215/62.  Cardiology was consulted, and labs were obtained which showed a positive troponin at 0.04. Troponin elevation may be secondary to hypertension vs possible NSTEMI (less likely by hx.) Patient was given 324 mg of aspirin, however did not initiate heparin as discussed with Cardiology.  XR shows no signs of pneumonia.  Discussed results and plan with patient and family in detail using Khmer phone interpreter.  [facility] unit in stable condition.   I personally performed the services described in this documentation, which was scribed in my presence. The recorded information has been reviewed and is accurate.   Alvira Monday, MD 02/19/15 [phone]

## 2015-02-18 NOTE — Progress Notes (Signed)
Notified Dr. Virgina OrganQureshi of patients arrival, awaiting admission orders.

## 2015-02-18 NOTE — ED Notes (Signed)
Patients daughter states that the patient has has had chest x 2 weeks with cough - the patient has swelling to ankles that the family feels is gout

## 2015-02-18 NOTE — Progress Notes (Signed)
Call report to [facility]

## 2015-02-18 NOTE — ED Notes (Signed)
MD at bedside speaking to pt and family using interpreter phone

## 2015-02-19 ENCOUNTER — Ambulatory Visit (HOSPITAL_COMMUNITY): Payer: Medicare Other

## 2015-02-19 DIAGNOSIS — I10 Essential (primary) hypertension: Secondary | ICD-10-CM

## 2015-02-19 DIAGNOSIS — I248 Other forms of acute ischemic heart disease: Secondary | ICD-10-CM

## 2015-02-19 DIAGNOSIS — R079 Chest pain, unspecified: Secondary | ICD-10-CM

## 2015-02-19 DIAGNOSIS — I441 Atrioventricular block, second degree: Secondary | ICD-10-CM | POA: Diagnosis present

## 2015-02-19 DIAGNOSIS — E0821 Diabetes mellitus due to underlying condition with diabetic nephropathy: Secondary | ICD-10-CM

## 2015-02-19 LAB — CBC WITH DIFFERENTIAL/PLATELET
BASOS ABS: 0 10*3/uL (ref 0.0–0.1)
BASOS PCT: 0 %
Eosinophils Absolute: 1.3 10*3/uL — ABNORMAL HIGH (ref 0.0–0.7)
Eosinophils Relative: 11 %
HEMATOCRIT: 38.4 % — AB (ref 39.0–52.0)
HEMOGLOBIN: 12.8 g/dL — AB (ref 13.0–17.0)
LYMPHS PCT: 17 %
Lymphs Abs: 2 10*3/uL (ref 0.7–4.0)
MCH: 30.3 pg (ref 26.0–34.0)
MCHC: 33.3 g/dL (ref 30.0–36.0)
MCV: 91 fL (ref 78.0–100.0)
MONO ABS: 0.9 10*3/uL (ref 0.1–1.0)
Monocytes Relative: 8 %
NEUTROS ABS: 7.1 10*3/uL (ref 1.7–7.7)
NEUTROS PCT: 64 %
Platelets: 176 10*3/uL (ref 150–400)
RBC: 4.22 MIL/uL (ref 4.22–5.81)
RDW: 12.1 % (ref 11.5–15.5)
WBC: 11.3 10*3/uL — ABNORMAL HIGH (ref 4.0–10.5)

## 2015-02-19 LAB — GLUCOSE, CAPILLARY
Glucose-Capillary: 209 mg/dL — ABNORMAL HIGH (ref 65–99)
Glucose-Capillary: 191 mg/dL — ABNORMAL HIGH (ref 65–99)
Glucose-Capillary: 231 mg/dL — ABNORMAL HIGH (ref 65–99)

## 2015-02-19 LAB — BASIC METABOLIC PANEL
ANION GAP: 8 (ref 5–15)
BUN: 12 mg/dL (ref 6–20)
CALCIUM: 8.9 mg/dL (ref 8.9–10.3)
CO2: 25 mmol/L (ref 22–32)
Chloride: 107 mmol/L (ref 101–111)
Creatinine, Ser: 1.62 mg/dL — ABNORMAL HIGH (ref 0.61–1.24)
GFR, EST AFRICAN AMERICAN: 43 mL/min — AB (ref >60)
GFR, EST NON AFRICAN AMERICAN: 37 mL/min — AB (ref >60)
GLUCOSE: 131 mg/dL — AB (ref 65–99)
POTASSIUM: 4.1 mmol/L (ref 3.5–5.1)
Sodium: 140 mmol/L (ref 135–145)

## 2015-02-19 LAB — HEPARIN LEVEL (UNFRACTIONATED): Heparin Unfractionated: 0.23 IU/mL — ABNORMAL LOW (ref 0.30–0.70)

## 2015-02-19 LAB — TSH: TSH: 1.516 u[IU]/mL (ref 0.350–4.500)

## 2015-02-19 LAB — PROTIME-INR
INR: 1.13 (ref 0.00–1.49)
PROTHROMBIN TIME: 14.7 s (ref 11.6–15.2)

## 2015-02-19 LAB — MRSA PCR SCREENING: MRSA by PCR: NEGATIVE

## 2015-02-19 MED ORDER — MAGNESIUM SULFATE IN D5W 10-5 MG/ML-% IV SOLN
1.0000 g | Freq: Once | INTRAVENOUS | Status: AC
  Administered 2015-02-19: 1 g via INTRAVENOUS
  Filled 2015-02-19: qty 100

## 2015-02-19 MED ORDER — AMLODIPINE BESYLATE 5 MG PO TABS
5.0000 mg | ORAL_TABLET | Freq: Every day | ORAL | Status: DC
  Administered 2015-02-19: 5 mg via ORAL
  Filled 2015-02-19: qty 1

## 2015-02-19 MED ORDER — HYDRALAZINE HCL 20 MG/ML IJ SOLN
10.0000 mg | Freq: Once | INTRAMUSCULAR | Status: AC
  Administered 2015-02-19: 10 mg via INTRAVENOUS

## 2015-02-19 MED ORDER — GLUCERNA SHAKE PO LIQD
237.0000 mL | Freq: Two times a day (BID) | ORAL | Status: DC
  Administered 2015-02-19 – 2015-02-21 (×3): 237 mL via ORAL

## 2015-02-19 MED ORDER — BENZONATATE 100 MG PO CAPS
100.0000 mg | ORAL_CAPSULE | Freq: Three times a day (TID) | ORAL | Status: DC | PRN
  Administered 2015-02-20 (×2): 100 mg via ORAL
  Filled 2015-02-19 (×2): qty 1

## 2015-02-19 MED ORDER — FUROSEMIDE 10 MG/ML IJ SOLN
40.0000 mg | Freq: Once | INTRAMUSCULAR | Status: AC
  Administered 2015-02-19: 40 mg via INTRAVENOUS
  Filled 2015-02-19: qty 4

## 2015-02-19 MED ORDER — MORPHINE SULFATE (PF) 2 MG/ML IV SOLN
2.0000 mg | Freq: Once | INTRAVENOUS | Status: AC
  Administered 2015-02-19: 2 mg via INTRAVENOUS
  Filled 2015-02-19: qty 1

## 2015-02-19 MED ORDER — INFLUENZA VAC SPLIT QUAD 0.5 ML IM SUSY
0.5000 mL | PREFILLED_SYRINGE | INTRAMUSCULAR | Status: AC
  Administered 2015-02-21: 0.5 mL via INTRAMUSCULAR

## 2015-02-19 MED ORDER — PNEUMOCOCCAL VAC POLYVALENT 25 MCG/0.5ML IJ INJ
0.5000 mL | INJECTION | INTRAMUSCULAR | Status: AC
  Administered 2015-02-21: 0.5 mL via INTRAMUSCULAR
  Filled 2015-02-19: qty 0.5

## 2015-02-19 MED ORDER — MORPHINE SULFATE (PF) 2 MG/ML IV SOLN
2.0000 mg | INTRAVENOUS | Status: DC | PRN
  Administered 2015-02-19 – 2015-02-20 (×3): 2 mg via INTRAVENOUS
  Filled 2015-02-19 (×2): qty 1

## 2015-02-19 MED ORDER — ATROPINE SULFATE 0.1 MG/ML IJ SOLN
INTRAMUSCULAR | Status: AC
  Filled 2015-02-19: qty 10

## 2015-02-19 MED ORDER — ONDANSETRON HCL 4 MG/2ML IJ SOLN
4.0000 mg | Freq: Once | INTRAMUSCULAR | Status: AC
  Administered 2015-02-19: 4 mg via INTRAVENOUS

## 2015-02-19 MED ORDER — HYDRALAZINE HCL 50 MG PO TABS
50.0000 mg | ORAL_TABLET | Freq: Three times a day (TID) | ORAL | Status: DC
  Administered 2015-02-19 – 2015-02-21 (×7): 50 mg via ORAL
  Filled 2015-02-19 (×8): qty 1

## 2015-02-19 MED ORDER — ONDANSETRON HCL 4 MG/2ML IJ SOLN: 4.0000 mg | Freq: Four times a day (QID) | INTRAMUSCULAR | Status: DC | PRN

## 2015-02-19 MED ORDER — HEPARIN BOLUS VIA INFUSION
2000.0000 [IU] | Freq: Once | INTRAVENOUS | Status: AC
  Administered 2015-02-19: 2000 [IU] via INTRAVENOUS
  Filled 2015-02-19: qty 2000

## 2015-02-19 MED ORDER — LISINOPRIL 20 MG PO TABS
20.0000 mg | ORAL_TABLET | Freq: Every day | ORAL | Status: DC
  Administered 2015-02-19 – 2015-02-21 (×3): 20 mg via ORAL
  Filled 2015-02-19 (×3): qty 1

## 2015-02-19 MED ORDER — FUROSEMIDE 10 MG/ML IJ SOLN
20.0000 mg | Freq: Once | INTRAMUSCULAR | Status: AC
  Administered 2015-02-19: 20 mg via INTRAVENOUS
  Filled 2015-02-19: qty 2

## 2015-02-19 NOTE — Progress Notes (Signed)
Pt's SBP 190-200's after mulitple PO blood pressure medications. Pt is resting and in no distress. Dr Anne FuSkains called and 40 lasix ordered. Will follow.

## 2015-02-19 NOTE — Progress Notes (Signed)
  Echocardiogram 2D Echocardiogram has been performed.  Arvil ChacoFoster, Ryshawn Sanzone 02/19/2015, 2:01 PM

## 2015-02-19 NOTE — Progress Notes (Signed)
ANTICOAGULATION CONSULT NOTE - Follow Up Consult  Pharmacy Consult for heparin Indication: chest pain/ACS  No Known Allergies  Patient Measurements: Height: 5\' 3"  (160 cm) Weight: 150 lb 5.7 oz (68.2 kg) IBW/kg (Calculated) : 56.9 Heparin Dosing Weight: 68 kg  Vital Signs: Temp: 98.8 F (37.1 C) (11/16 1600) Temp Source: Oral (11/16 1600) BP: 157/47 mmHg (11/16 1900) Pulse Rate: 113 (11/16 1900)  Labs:  Recent Labs  02/18/15 1828 02/19/15 0512 02/19/15 1000 02/19/15 1857  HGB 12.7* 12.8*  --   --   HCT 38.2* 38.4*  --   --   PLT 211 176  --   --   LABPROT  --  14.7  --   --   INR  --  1.13  --   --   HEPARINUNFRC  --   --  0.23* <0.10*  CREATININE 1.63* 1.62*  --   --   TROPONINI 0.04*  --   --   --     Estimated Creatinine Clearance: 26.3 mL/min (by C-G formula based on Cr of 1.62).   Medications:  Infusions:  . heparin 950 Units/hr (02/19/15 2007)    Assessment: 81 y/o male who continues on IV heparin for chest pain. Heparin level is subtherapeutic at <0.1 on 950 units/hr and decreased despite previous rate increase. Spoke with RN and no problems with infusion; she was going to check the line again and call back if she found anything wrong. No bleeding noted.  Goal of Therapy:  Heparin level 0.3-0.7 units/ml Monitor platelets by anticoagulation protocol: Yes   Plan:  - Heparin 2000 units IV bolus then infuse at 1150 units/hr - 8 hr heparin level - Daily heparin level and CBC - Monitor for s/sx of bleeding  [facility] [place], [place]atosPharm.D., BCPS Clinical Pharmacist Pager: [phone] 02/19/2015 8:38 PM

## 2015-02-19 NOTE — Care Management Note (Signed)
Case Management Note  Patient Details  Name: Tyler Ray MRN: [id number] Date of Birth: 04/03/1927  Subjective/Objective:      admn w angina, htn              Action/Plan: lives w fam   Expected Discharge Date:                  Expected Discharge Plan:     In-House Referral:     Discharge planning Services     Post Acute Care Choice:    Choice offered to:     DME Arranged:    DME Agency:     HH Arranged:    HH Agency:     Status of Service:     Medicare Important Message Given:    Date Medicare IM Given:    Medicare IM give by:    Date Additional Medicare IM Given:    Additional Medicare Important Message give by:     If discussed at Long Length of Stay Meetings, dates discussed:    Additional Comments: ur review done  Hanley HaysDowell, Tanee Henery T, RN 02/19/2015, 8:56 AM

## 2015-02-19 NOTE — Progress Notes (Addendum)
Cardiology fellow called about high SBP 176. Dr. said to watch and give 10mg  hydralizine in 6 hours if over 160 systolic.  Will continue to monitor.

## 2015-02-19 NOTE — Progress Notes (Signed)
ANTICOAGULATION CONSULT NOTE   Pharmacy Consult for heparin IV Indication: ACS/STEMI  No Known Allergies  Patient Measurements: Height: 5\' 3"  (160 cm) Weight: 150 lb 5.7 oz (68.2 kg) IBW/kg (Calculated) : 56.9 Heparin Dosing Weight: 68.5 kg  Vital Signs: Temp: 98.3 F (36.8 C) (11/16 0700) Temp Source: Oral (11/16 0700) BP: 209/67 mmHg (11/16 0900) Pulse Rate: 56 (11/16 0900)  Labs:  Recent Labs  02/18/15 1828 02/19/15 0512 02/19/15 1000  HGB 12.7* 12.8*  --   HCT 38.2* 38.4*  --   PLT 211 176  --   LABPROT  --  14.7  --   INR  --  1.13  --   HEPARINUNFRC  --   --  0.23*  CREATININE 1.63* 1.62*  --   TROPONINI 0.04*  --   --     Estimated Creatinine Clearance: 26.3 mL/min (by C-G formula based on Cr of 1.62).  Assessment: 81 y.o male who [facility] complaining of intermittent, moderate bilateral lower chest pain that occurs with unproductive cough and started 2 weeks ago. Pt's granddaughter denies a history of MI or CAD. EKG in ED showed heart block. Troponin 0.04.   [facility]. Pharmacy consulted to start IV heparin drip for ACS/STEMI. He was not on anticoagulation PTA.  Initial HL 0.23 on 800 units/hr (below goal), no bleeding issues noted by nursing. Will adjust rate to 950 units/hr and recheck level this afternoon.  Goal of Therapy:  Heparin level 0.3-0.7 units/ml Monitor platelets by anticoagulation protocol: Yes   Plan:  Increase Heparin drip to 950 units/hr Recheck HL this evening Daily heparin level and CBC  Thank you for allowing pharmacy to be part of this patients care team.  Sheppard CoilFrank Wilson PharmD., BCPS Clinical Pharmacist Pager [phone] 02/19/2015 11:03 AM

## 2015-02-19 NOTE — Progress Notes (Addendum)
Initial Nutrition Assessment  DOCUMENTATION CODES:   Non-severe (moderate) malnutrition in context of chronic illness  INTERVENTION:  Glucerna Shake po BID, each supplement provides 220 kcal and 10 grams of protein  NUTRITION DIAGNOSIS:   Malnutrition related to chronic illness as evidenced by moderate depletion of body fat, moderate depletions of muscle mass.  GOAL:   Patient will meet greater than or equal to 90% of their needs  MONITOR:   PO intake, Supplement acceptance, Labs, Skin, Weight trends  REASON FOR ASSESSMENT:   Malnutrition Screening Tool   ASSESSMENT:   Pt is a [place] male who does not speak english has history of DM, HTN, gout and high cholesterol found to have second-degree Type 1 heart block. Poorly controlled HTN and CBG's have been in the 200's.   Medications reviewed and include: magnesium sulfate, novolog CBG's: 191-209 Per RN diet just advanced. Spoke with pt via [employer] line [phone](ID#[id number]). Pt unsure of weight loss and unable to provide specifics about intake. No other family present.  Nutrition-Focused physical exam completed. Findings are moderate fat depletion, moderate muscle depletion, and moderate edema.   Diet Order:  Diet heart healthy/carb modified Room service appropriate?: Yes; Fluid consistency:: Thin  Skin:  Reviewed, no issues  Last BM:  unknown  Height:   Ht Readings from Last 1 Encounters:  02/18/15 5\' 3"  (1.6 m)   Weight:   Wt Readings from Last 1 Encounters:  02/19/15 150 lb 5.7 oz (68.2 kg)    Ideal Body Weight:  56.3 kg  BMI:  Body mass index is 26.64 kg/(m^2).  Estimated Nutritional Needs:   Kcal:  1400-1600  Protein:  70-80 grams  Fluid:  > 1.5 L/day  EDUCATION NEEDS:   No education needs identified at this time  Kendell BaneHeather Fielding Mault RD, LDN, CNSC [phone] Pager [phone] After Hours Pager

## 2015-02-19 NOTE — Progress Notes (Signed)
Called Cardiology to ask about HR, BP, cough, and pain.  Orders were put in to skip Lisinopril tonight to try and help cough.  Morphine was ordered for pain and hydralazine for high BP.  Both were given.  Goal HR is to stay above 35 and goal BP systolic above 135.  Will continue to monitor patient.

## 2015-02-20 ENCOUNTER — Inpatient Hospital Stay (HOSPITAL_COMMUNITY): Payer: Medicare Other

## 2015-02-20 DIAGNOSIS — I441 Atrioventricular block, second degree: Secondary | ICD-10-CM

## 2015-02-20 DIAGNOSIS — R7989 Other specified abnormal findings of blood chemistry: Secondary | ICD-10-CM

## 2015-02-20 DIAGNOSIS — N183 Chronic kidney disease, stage 3 (moderate): Secondary | ICD-10-CM

## 2015-02-20 LAB — CBC WITH DIFFERENTIAL/PLATELET
Basophils Absolute: 0.1 10*3/uL (ref 0.0–0.1)
Basophils Relative: 0 %
EOS ABS: 0.9 10*3/uL — AB (ref 0.0–0.7)
Eosinophils Relative: 8 %
HEMATOCRIT: 35.4 % — AB (ref 39.0–52.0)
HEMOGLOBIN: 12 g/dL — AB (ref 13.0–17.0)
LYMPHS ABS: 2.3 10*3/uL (ref 0.7–4.0)
Lymphocytes Relative: 20 %
MCH: 30.8 pg (ref 26.0–34.0)
MCHC: 33.9 g/dL (ref 30.0–36.0)
MCV: 90.8 fL (ref 78.0–100.0)
MONOS PCT: 6 %
Monocytes Absolute: 0.7 10*3/uL (ref 0.1–1.0)
NEUTROS ABS: 7.5 10*3/uL (ref 1.7–7.7)
NEUTROS PCT: 66 %
Platelets: 194 10*3/uL (ref 150–400)
RBC: 3.9 MIL/uL — ABNORMAL LOW (ref 4.22–5.81)
RDW: 12.2 % (ref 11.5–15.5)
WBC: 11.5 10*3/uL — ABNORMAL HIGH (ref 4.0–10.5)

## 2015-02-20 LAB — BASIC METABOLIC PANEL
Anion gap: 7 (ref 5–15)
BUN: 16 mg/dL (ref 6–20)
CHLORIDE: 102 mmol/L (ref 101–111)
CO2: 28 mmol/L (ref 22–32)
CREATININE: 1.82 mg/dL — AB (ref 0.61–1.24)
Calcium: 8.9 mg/dL (ref 8.9–10.3)
GFR calc non Af Amer: 32 mL/min — ABNORMAL LOW (ref >60)
GFR, EST AFRICAN AMERICAN: 37 mL/min — AB (ref >60)
Glucose, Bld: 149 mg/dL — ABNORMAL HIGH (ref 65–99)
Potassium: 4.3 mmol/L (ref 3.5–5.1)
Sodium: 137 mmol/L (ref 135–145)

## 2015-02-20 LAB — GLUCOSE, CAPILLARY
GLUCOSE-CAPILLARY: 233 mg/dL — AB (ref 65–99)
GLUCOSE-CAPILLARY: 212 mg/dL — AB (ref 65–99)
GLUCOSE-CAPILLARY: 131 mg/dL — AB (ref 65–99)
GLUCOSE-CAPILLARY: 99 mg/dL (ref 65–99)
GLUCOSE-CAPILLARY: 197 mg/dL — AB (ref 65–99)
Glucose-Capillary: 157 mg/dL — ABNORMAL HIGH (ref 65–99)

## 2015-02-20 LAB — HEMOGLOBIN A1C
HEMOGLOBIN A1C: 8.6 % — AB (ref 4.8–5.6)
MEAN PLASMA GLUCOSE: 200 mg/dL

## 2015-02-20 LAB — HEPARIN LEVEL (UNFRACTIONATED): Heparin Unfractionated: 0.35 IU/mL (ref 0.30–0.70)

## 2015-02-20 LAB — MAGNESIUM: Magnesium: 1.7 mg/dL (ref 1.7–2.4)

## 2015-02-20 MED ORDER — ACETAMINOPHEN 325 MG PO TABS
650.0000 mg | ORAL_TABLET | Freq: Four times a day (QID) | ORAL | Status: DC | PRN
  Administered 2015-02-20 (×2): 650 mg via ORAL
  Filled 2015-02-20 (×2): qty 2

## 2015-02-20 MED ORDER — FUROSEMIDE 40 MG PO TABS
40.0000 mg | ORAL_TABLET | Freq: Every day | ORAL | Status: DC
  Administered 2015-02-20: 40 mg via ORAL
  Filled 2015-02-20: qty 1

## 2015-02-20 MED ORDER — ENOXAPARIN SODIUM 30 MG/0.3ML ~~LOC~~ SOLN
30.0000 mg | SUBCUTANEOUS | Status: DC
Start: 2015-02-20 — End: 2015-02-21
  Administered 2015-02-20 – 2015-02-21 (×2): 30 mg via SUBCUTANEOUS
  Filled 2015-02-20 (×2): qty 0.3

## 2015-02-20 MED ORDER — MAGNESIUM SULFATE IN D5W 10-5 MG/ML-% IV SOLN
1.0000 g | Freq: Once | INTRAVENOUS | Status: AC
  Administered 2015-02-20: 1 g via INTRAVENOUS
  Filled 2015-02-20: qty 100

## 2015-02-20 MED ORDER — INSULIN ASPART 100 UNIT/ML ~~LOC~~ SOLN: 0.0000 [IU] | Freq: Every day | SUBCUTANEOUS | Status: DC

## 2015-02-20 MED ORDER — PREDNISONE 20 MG PO TABS
30.0000 mg | ORAL_TABLET | Freq: Once | ORAL | Status: AC
  Administered 2015-02-20: 30 mg via ORAL
  Filled 2015-02-20: qty 1

## 2015-02-20 MED ORDER — AMLODIPINE BESYLATE 10 MG PO TABS
10.0000 mg | ORAL_TABLET | Freq: Every day | ORAL | Status: DC
  Administered 2015-02-20 – 2015-02-21 (×2): 10 mg via ORAL
  Filled 2015-02-20 (×2): qty 1

## 2015-02-20 MED ORDER — INSULIN ASPART 100 UNIT/ML ~~LOC~~ SOLN
0.0000 [IU] | Freq: Three times a day (TID) | SUBCUTANEOUS | Status: DC
  Administered 2015-02-21: 7 [IU] via SUBCUTANEOUS
  Administered 2015-02-21: 4 [IU] via SUBCUTANEOUS

## 2015-02-20 MED ORDER — MORPHINE SULFATE (PF) 2 MG/ML IV SOLN: 2.0000 mg | INTRAVENOUS | Status: DC | PRN

## 2015-02-20 MED ORDER — INSULIN ASPART 100 UNIT/ML ~~LOC~~ SOLN
0.0000 [IU] | Freq: Three times a day (TID) | SUBCUTANEOUS | Status: DC
  Administered 2015-02-20: 5 [IU] via SUBCUTANEOUS
  Administered 2015-02-20: 3 [IU] via SUBCUTANEOUS

## 2015-02-20 NOTE — Progress Notes (Signed)
Called px's granddaughter to let her know [facility] was [facility]

## 2015-02-20 NOTE — Progress Notes (Signed)
Spoke with Ortho who recommended ankle XR, physical therapy, consult with ortho tech for CAM walker boot, and starting prednisone 30mg  to be tapered 5mg  every 2 days thereafter.

## 2015-02-20 NOTE — Consult Note (Signed)
  Spoke to Cardiology regarding Mr. Tyler Ray's left greater than right ankle  No wounds No concern for clinical infection based on presentation  X-rays reviewed and so evidence of some degenerative joint changes but no osteomyelitis changes  Recommended placing him in Cam Walker boot to rest ankle and start short 12 day course of prednisone  Can follow up in Orthopaedic office provided he has adequate translator support at visit if symptoms persist at discharge May need PT visit to make sure he can ambulate fine with boot in place

## 2015-02-20 NOTE — Progress Notes (Signed)
Pharmacy note: lovenox  81 yo male with CP on heparin and with low suspicion for ACS. Pharmacy has been asked to transition to lovenox for VTE prophylaxis. -Hg= 12.0, plt= 194 -SCr= 1.82 (up), CrCl ~ 20-25  Plan -Lovenox 30mg  [place] q24hr -Will sign off for now. Please contact pharmacy with any other needs  Thank you, Greig Castillandrew

## 2015-02-20 NOTE — Progress Notes (Signed)
Report given for px to [facility]

## 2015-02-20 NOTE — Progress Notes (Signed)
Orthopedic Tech Progress Note Patient Details:  Tyler Ray 05/14/1926 [id number]  Ortho Devices Type of Ortho Device: CAM walker Ortho Device/Splint Location: lle Ortho Device/Splint Interventions: Application   Cherron Blitzer 02/20/2015, 4:26 PM

## 2015-02-20 NOTE — Progress Notes (Signed)
Subjective:  This AM, he denies any additional chest pain, dyspnea, nausea/vomiting, dizziness. He feels his leg swelling has improved bilaterally though feels he is unable to walk as a consequence of the pain he is experiencing in his left ankle. He reports having a prior episode some years ago in the absence of injury which improved with Aleve though persisted for 2-3 weeks before abating altogether. This current episode began 2-3 weeks ago.  Objective:  Vital Signs in the last 24 hours: Temp:  [98.3 F (36.8 C)-99.7 F (37.6 C)] 98.7 F (37.1 C) (11/17 0800) Pulse Rate:  [31-127] 57 (11/17 0700) Resp:  [8-25] 14 (11/17 0700) BP: (151-210)/(44-112) 175/59 mmHg (11/17 0700) SpO2:  [90 %-98 %] 95 % (11/17 0700) Weight:  [146 lb 6.2 oz (66.4 kg)] 146 lb 6.2 oz (66.4 kg) (11/17 0400)  Intake/Output from previous day: 11/16 0701 - 11/17 0700 In: 1230.2 [P.O.:800; I.V.:230.2; IV Piggyback:200] Out: 2755 [Urine:2725; Emesis/NG output:30]  Physical Exam: General: elderly [place] male, sitting upright in chair, NAD HEENT: PERRL, EOMI, no scleral icterus, oropharynx clear Cardiac: early to mid systolic ejection murmur best heard at the RUSB, regular rate  Pulm: clear to auscultation bilaterally, no wheezes, rales, or rhonchi Abd: soft, nontender, nondistended, BS present Ext: warm and well perfused, no pedal or tibial edema, 2+ DP pulses bilaterally, L ankle swelling noted without pain on palpation, erythema, warmth Neuro: responds to questions appropriately; moving all extremities freely   Lab Results:  Recent Labs  02/19/15 0512 02/20/15 0332  WBC 11.3* 11.5*  HGB 12.8* 12.0*  PLT 176 194    Recent Labs  02/19/15 0512 02/20/15 0332  NA 140 137  K 4.1 4.3  CL 107 102  CO2 25 28  GLUCOSE 131* 149*  BUN 12 16  CREATININE 1.62* 1.82*    Recent Labs  02/18/15 1828  TROPONINI 0.04*    Cardiac Studies: Echo 11/16:  - Left ventricle: The cavity size was  normal. Features are consistent with a pseudonormal left ventricular filling pattern, with concomitant abnormal relaxation and increased filling pressure (grade 2 diastolic dysfunction). - Aortic valve: There was very mild stenosis. There was mild regurgitation. Valve area (VTI): 1.48 cm^2. Valve area (Vmax): 1.47 cm^2. Valve area (Vmean): 1.67 cm^2. - Mitral valve: There was mild regurgitation. - Right atrium: The atrium was moderately dilated.  Tele: Intermittent 2nd degree heart block, first-degree  Assessment/Plan:  Mr. Havey is an 81 year old. [place] male with a history of DM, HTN, acute monarticular arthritis, and high cholesterol found to have intermittent second-degree Type 1 heart block and chronic diastolic heart failure.  Second-degree Type 1 heart block: As noted on admission EKG though appears transient on telemetry.  HR again borderline bradycardic with mostly 50s-60s. Suspect age-related fibrosis around AV node as etiology.  -Continue ASA  -Stop heparin IV and switch to Lovenox for DVT prophylaxis given low suspicion for ACS -Replete Mg 1g given impaired renal function and recheck   Chronic diastolic heart failure: Grade 2 diastolic dysfunction as noted on echo. Wt 146 lbs today, down from 150 yesterday with net -1.5L.  -Defer B-blocker as noted above -Start Lasix   Poorly controlled HTN: Systolic BP 160s-180s though now in the 200s. Home meds include lisinopril . Received Lasix  IV total yesterday.  -Continue lisinopril -Increase Norvasc  -Continue hydralazine  TID  Hyperlipidemia: No lipid panel on file. Home meds include simvastatin . -Continue simvastatin   Type 2 diabetes: A1c 8.6. CBGs trending  mid 150s-200s.  -Switch to SSI-M with HS correction  CKD Stage 3: GFR trending in th 30s since admission consistent with Crt 1.6-1.8. Mg 1.7, up from 1.5 yesterday. -Lisinopril and simvastatin as noted above -Replete Mg 1g  again today.  Acute monoarticular arthritis: Differential includes inflammatory, infection, or trauma. Infection or trauma unlikely given the history. Inflammatory possibilities include gout, osteoarthritis, rheumatoid arthritis and possible given the improvement with NSAID, disease-free interval, and same joint involvement. -Consider ankle XR -Try colchicine 1.2mg  followed by 0.6mg  daily   Heywood IlesPatel, Rushil, M.D. 02/20/2015, 8:41 AM   Personally seen and examined. Agree with above.  Main complaint is left ankle pain, no fever. Difficult to walk. Tylenol helped.   - ORTHO consult  - Hopeful DC home soon.   - transfer to tele  Occasional heart block second degree noted. No pacer, asymptomatic. Avoiding AV nodal blocking agents.  In future may need pacer  EF reassuring  Troponin minimal elevation. Reassuring  Donato SchultzSKAINS, MARK, MD

## 2015-02-21 DIAGNOSIS — I442 Atrioventricular block, complete: Secondary | ICD-10-CM | POA: Diagnosis not present

## 2015-02-21 LAB — CBC WITH DIFFERENTIAL/PLATELET
BASOS ABS: 0 10*3/uL (ref 0.0–0.1)
Basophils Relative: 0 %
Eosinophils Absolute: 0 10*3/uL (ref 0.0–0.7)
Eosinophils Relative: 0 %
HEMATOCRIT: 40 % (ref 39.0–52.0)
HEMOGLOBIN: 13.7 g/dL (ref 13.0–17.0)
Lymphocytes Relative: 13 %
Lymphs Abs: 1.3 10*3/uL (ref 0.7–4.0)
MCH: 30.6 pg (ref 26.0–34.0)
MCHC: 34.3 g/dL (ref 30.0–36.0)
MCV: 89.5 fL (ref 78.0–100.0)
MONO ABS: 0.3 10*3/uL (ref 0.1–1.0)
Monocytes Relative: 3 %
NEUTROS ABS: 8.2 10*3/uL — AB (ref 1.7–7.7)
NEUTROS PCT: 84 %
Platelets: 247 10*3/uL (ref 150–400)
RBC: 4.47 MIL/uL (ref 4.22–5.81)
RDW: 12 % (ref 11.5–15.5)
WBC: 9.7 10*3/uL (ref 4.0–10.5)

## 2015-02-21 LAB — BASIC METABOLIC PANEL
ANION GAP: 9 (ref 5–15)
BUN: 22 mg/dL — ABNORMAL HIGH (ref 6–20)
CALCIUM: 9 mg/dL (ref 8.9–10.3)
CO2: 26 mmol/L (ref 22–32)
Chloride: 95 mmol/L — ABNORMAL LOW (ref 101–111)
Creatinine, Ser: 1.91 mg/dL — ABNORMAL HIGH (ref 0.61–1.24)
GFR, EST AFRICAN AMERICAN: 35 mL/min — AB (ref >60)
GFR, EST NON AFRICAN AMERICAN: 30 mL/min — AB (ref >60)
Glucose, Bld: 237 mg/dL — ABNORMAL HIGH (ref 65–99)
Potassium: 4.5 mmol/L (ref 3.5–5.1)
SODIUM: 130 mmol/L — AB (ref 135–145)

## 2015-02-21 LAB — GLUCOSE, CAPILLARY
GLUCOSE-CAPILLARY: 240 mg/dL — AB (ref 65–99)
GLUCOSE-CAPILLARY: 180 mg/dL — AB (ref 65–99)

## 2015-02-21 LAB — MAGNESIUM: Magnesium: 1.8 mg/dL (ref 1.7–2.4)

## 2015-02-21 MED ORDER — ASPIRIN 81 MG PO TBEC: 81.0000 mg | DELAYED_RELEASE_TABLET | Freq: Every day | ORAL | Status: AC

## 2015-02-21 MED ORDER — HYDRALAZINE HCL 50 MG PO TABS: 50.0000 mg | ORAL_TABLET | Freq: Three times a day (TID) | ORAL | Status: AC

## 2015-02-21 MED ORDER — PREDNISONE 20 MG PO TABS
30.0000 mg | ORAL_TABLET | Freq: Every day | ORAL | Status: AC
  Administered 2015-02-21: 30 mg via ORAL
  Filled 2015-02-21: qty 1

## 2015-02-21 MED ORDER — AMLODIPINE BESYLATE 10 MG PO TABS: 10.0000 mg | ORAL_TABLET | Freq: Every day | ORAL | Status: AC

## 2015-02-21 MED ORDER — FUROSEMIDE 20 MG PO TABS
20.0000 mg | ORAL_TABLET | Freq: Every day | ORAL | Status: DC
  Administered 2015-02-21: 20 mg via ORAL
  Filled 2015-02-21: qty 1

## 2015-02-21 MED ORDER — PREDNISONE 10 MG PO TABS: ORAL_TABLET | ORAL | Status: AC

## 2015-02-21 MED ORDER — FUROSEMIDE 20 MG PO TABS: 20.0000 mg | ORAL_TABLET | Freq: Every day | ORAL | Status: AC

## 2015-02-21 NOTE — Progress Notes (Addendum)
Patient Name: Tyler Ray Date of Encounter: 02/21/2015   Active Problems:   Unstable angina (HCC)   Diabetes mellitus with renal manifestation (HCC)   Chronic kidney disease, stage III (moderate)   Essential hypertension   Elevated troponin level   Demand ischemia (HCC)   Second degree heart block  SUBJECTIVE  Feeling well. No chest pain, sob or palpitations. Ambulated well.   CURRENT MEDS . amLODipine  10 mg Oral Daily  . aspirin EC  81 mg Oral Daily  . enoxaparin (LOVENOX) injection  30 mg Subcutaneous Q24H  . feeding supplement (GLUCERNA SHAKE)  237 mL Oral BID BM  . furosemide  40 mg Oral Daily  . hydrALAZINE  50 mg Oral 3 times per day  . Influenza vac split quadrivalent PF  0.5 mL Intramuscular Tomorrow-1000  . insulin aspart  0-20 Units Subcutaneous TID WC  . insulin aspart  0-5 Units Subcutaneous QHS  . lisinopril  20 mg Oral Daily  . pneumococcal 23 valent vaccine  0.5 mL Intramuscular Tomorrow-1000  . simvastatin  40 mg Oral q1800    OBJECTIVE  Filed Vitals:   02/20/15 2142 02/21/15 0059 02/21/15 0526 02/21/15 0549  BP: 160/62 150/66 134/48 134/48  Pulse:    47  Temp:    98.6 F (37 C)  TempSrc:    Oral  Resp:    18  Height:      Weight:    143 lb 14.4 oz (65.273 kg)  SpO2:    94%    Intake/Output Summary (Last 24 hours) at 02/21/15 0935 Last data filed at 02/21/15 0810  Gross per 24 hour  Intake    460 ml  Output   1735 ml  Net  -1275 ml   Filed Weights   02/19/15 0500 02/20/15 0400 02/21/15 0549  Weight: 150 lb 5.7 oz (68.2 kg) 146 lb 6.2 oz (66.4 kg) 143 lb 14.4 oz (65.273 kg)    PHYSICAL EXAM  General: Pleasant, elderly [place] male in NAD. Neuro: Alert and oriented X 3. Moves all extremities spontaneously. Psych: Normal affect. HEENT:  Normal  Neck: Supple without bruits or JVD. Lungs:  Resp regular and unlabored, CTA. Heart: RRR no s3, s4.Systolic murmurs. Abdomen: Soft, non-tender, non-distended, BS + x 4.  Extremities: No  clubbing, cyanosis or edema. DP/PT/Radials 2+ and equal bilaterally. L ankle swelling noted without pain on palpation, erythema, warmth  Accessory Clinical Findings  CBC  Recent Labs  02/20/15 0332 02/21/15 0510  WBC 11.5* 9.7  NEUTROABS 7.5 8.2*  HGB 12.0* 13.7  HCT 35.4* 40.0  MCV 90.8 89.5  PLT 194 247   Basic Metabolic Panel  Recent Labs  02/20/15 0332 02/21/15 0510  NA 137 130*  K 4.3 4.5  CL 102 95*  CO2 28 26  GLUCOSE 149* 237*  BUN 16 22*  CREATININE 1.82* 1.91*  CALCIUM 8.9 9.0  MG 1.7 1.8   Liver Function Tests No results for input(s): AST, ALT, ALKPHOS, BILITOT, PROT, ALBUMIN in the last 72 hours. No results for input(s): LIPASE, AMYLASE in the last 72 hours. Cardiac Enzymes  Recent Labs  02/18/15 1828  TROPONINI 0.04*   Hemoglobin A1C  Recent Labs  02/18/15 2258  HGBA1C 8.6*   Thyroid Function Tests  Recent Labs  02/18/15 2258  TSH 1.516    TELE  Intermittent 2nd degree heart block, first-degree  Radiology/Studies  Dg Chest 2 View  02/18/2015  CLINICAL DATA:  Cough.  Chest pain.  Bilateral ankle swelling. EXAM:  CHEST  2 VIEW COMPARISON:  None. FINDINGS: Atherosclerotic aortic arch. Mild enlargement of the cardiopericardial silhouette. No edema. The lungs appear clear.  No pleural effusion. IMPRESSION: 1. Mild enlargement of the cardiopericardial silhouette, without edema. 2. Atherosclerotic aortic arch. Electronically Signed   By: Tyler RongWalter  Ray M.D.   On: 02/18/2015 19:13   Dg Ankle Complete Left  02/20/2015  CLINICAL DATA:  Left ankle pain for 1 day. No known injury. Right ankle is for comparison purposes. EXAM: LEFT ANKLE COMPLETE - 3+ VIEW; RIGHT ANKLE - COMPLETE 3+ VIEW COMPARISON:  None. FINDINGS: Left ankle: Moderate degenerative changes involving the ankle joint with subchondral cystic change and spurring. No acute fracture or osteochondral lesion. Subtalar joint degenerative changes are also noted. No mid or hindfoot  fracture. Extensive vascular calcifications. Right ankle: Bony detail somewhat obscured by the patient's socks. Moderate degenerative changes similar to the left ankle. No acute fracture. Small calcaneal spurs are noted. Extensive vascular calcifications. IMPRESSION: Bilateral ankle joint degenerative changes but no acute bony findings. Extensive vascular calcifications. Electronically Signed   By: Tyler MeyerP.  Ray M.D.   On: 02/20/2015 17:03   Dg Ankle Complete Right  02/20/2015  CLINICAL DATA:  Left ankle pain for 1 day. No known injury. Right ankle is for comparison purposes. EXAM: LEFT ANKLE COMPLETE - 3+ VIEW; RIGHT ANKLE - COMPLETE 3+ VIEW COMPARISON:  None. FINDINGS: Left ankle: Moderate degenerative changes involving the ankle joint with subchondral cystic change and spurring. No acute fracture or osteochondral lesion. Subtalar joint degenerative changes are also noted. No mid or hindfoot fracture. Extensive vascular calcifications. Right ankle: Bony detail somewhat obscured by the patient's socks. Moderate degenerative changes similar to the left ankle. No acute fracture. Small calcaneal spurs are noted. Extensive vascular calcifications. IMPRESSION: Bilateral ankle joint degenerative changes but no acute bony findings. Extensive vascular calcifications. Electronically Signed   By: Tyler MeyerP.  Ray M.D.   On: 02/20/2015 17:03    ASSESSMENT AND PLAN    Tyler Ray is an 81 year old. [name] with a history of DM, HTN, acute monarticular arthritis, and high cholesterol found to have intermittent second-degree Type 1 heart block and chronic diastolic heart failure.  Second-degree Type 1 heart block: As noted on admission EKG though appears transient on telemetry. HR again borderline bradycardic with mostly 50s-60s. Suspect age-related fibrosis around AV node as etiology.  -Continue ASA 81mg  -Avoid AV blocking agen  Chronic diastolic heart failure: Grade 2 diastolic dysfunction as noted on echo.  Net 7lb weight loss with net I&O of negative 3.7 L.  -No B-blocker as noted above -On Lasix 40mg  po qd. Creatinine continue to worse, today 1.91. Seems over dry. Consider reducing dose.   Poorly controlled HTN: Systolic BP 160s-200s. Now improved. Most recent reading 134/78.  -Continue lisinopril 20mg , Norvasc 10mg  and hydralazine 50mg  TID. Lasix as above. Can consider reducing lisinopril if continue to worsen kidney function.   Hyperlipidemia: No lipid panel on file. Home meds include simvastatin 10mg . -Continue simvastatin 40mg   Type 2 diabetes: A1c 8.6. CBGs trending mid 150s-200s.  -Switched to SSI-M with HS correction. Discharge on metformin and close f/u with PCP.   CKD Stage 3: GFR trending in th 30s since admission consistent with Crt 1.6-1.8. Today 1.91.  -AS above  Acute monoarticular arthritis:  - evidence of some degenerative joint changes but no osteomyelitis changes - Cam Walker boot to rest ankle. Prednisone 30mg  to be tapered 5mg  every 2 days thereafter.   Lorelei PontSigned, Bhagat,Bhavinkumar PA-C Pager [phone]  Personally seen and examined. Agree with above. Creatinine has increased today, likely a result of Lasix and demonstration. We will decrease Lasix to 20 mg once a day. Blood pressure under much better control. Appreciate orthopedic recommendations. Prednisone taper. Cam walker boot.  Transient 2-1 heart block noted. First-degree AV block noted. He has had second-degree heart block type I previously. Avoid beta blocker, other AV nodal blockade  OK for DC home. Ne[place] close follow[place] in [place].  Donato Schultz, MD

## 2015-02-21 NOTE — Evaluation (Signed)
Physical Therapy Evaluation Patient Details Name: Tyler Ray MRN: [id number] DOB: 02/11/1927 Today's Date: 02/21/2015   History of Present Illness  Pt is an 81 year old [name] with a history of DM, HTN, acute monarticular arthritis, and high cholesterol found to have intermittent second-degree Type 1 heart block and chronic diastolic heart failure. He also presents with L ankle pain and edema. Xray negative for fracture.   Clinical Impression  Pt evaluated for use of cam boot LLE during ambulation. Pt ambulated without difficulty 300 feet using RW with supervision. No further PT services indicated. PT signing off.    Follow Up Recommendations No PT follow up;Supervision for mobility/OOB    Equipment Recommendations  Rolling walker with 5" wheels    Recommendations for Other Services       Precautions / Restrictions Precautions Precautions: None Required Braces or Orthoses: Other Brace/Splint Other Brace/Splint: cam boot LLE Restrictions Weight Bearing Restrictions: No      Mobility  Bed Mobility Overal bed mobility: Modified Independent                Transfers Overall transfer level: Modified independent                  Ambulation/Gait Ambulation/Gait assistance: Supervision Ambulation Distance (Feet): 300 Feet Assistive device: Rolling walker (2 wheeled) Gait Pattern/deviations: Antalgic;Step-through pattern;Decreased stride length Gait velocity: decreased   General Gait Details: Therapist donned/doffed cam boot.  Stairs            Wheelchair Mobility    Modified Rankin (Stroke Patients Only)       Balance                                             Pertinent Vitals/Pain Pain Assessment: Faces Faces Pain Scale: Hurts little more Pain Location: L ankle Pain Descriptors / Indicators: Grimacing;Guarding Pain Intervention(s): Monitored during session    Home Living Family/patient expects to be discharged to::  Private residence Living Arrangements: Children;Spouse/significant other;Other relatives Available Help at Discharge: Family;Available 24 hours/day Type of Home: Apartment       Home Layout: One level Home Equipment: None      Prior Function Level of Independence: Independent               Hand Dominance        Extremity/Trunk Assessment   Upper Extremity Assessment: Overall WFL for tasks assessed           Lower Extremity Assessment: Overall WFL for tasks assessed      Cervical / Trunk Assessment: Normal  Communication   Communication: Interpreter utilized Multimedia programmer([employer] utilized at the beginning of eval. However, they disconnected abruptly.)  Cognition Arousal/Alertness: Awake/alert Behavior During Therapy: WFL for tasks assessed/performed Overall Cognitive Status: Within Functional Limits for tasks assessed                      General Comments      Exercises        Assessment/Plan    PT Assessment Patent does not need any further PT services  PT Diagnosis Difficulty walking;Acute pain   PT Problem List    PT Treatment Interventions     PT Goals (Current goals can be found in the Care Plan section) Acute Rehab PT Goals Patient Stated Goal: not stated PT Goal Formulation: All assessment and education complete,  DC therapy    Frequency     Barriers to discharge        Co-evaluation               End of Session Equipment Utilized During Treatment: Gait belt;Other (comment) (cam boot LLE) Activity Tolerance: Patient tolerated treatment well Patient left: in bed;with call bell/phone within reach;with family/visitor present Nurse Communication: Mobility status         Time: 1610-9604 PT Time Calculation (min) (ACUTE ONLY): 16 min   Charges:   PT Evaluation $Initial PT Evaluation Tier I: 1 Procedure     PT G Codes:        Ilda Foil 02/21/2015, 10:19 AM

## 2015-02-21 NOTE — Discharge Summary (Signed)
Discharge Summary   Patient ID: Tyler Ray,  MRN: [id number], DOB/AGE: Dec 29, 1926 81 y.o.  Admit date: 02/18/2015 Discharge date: 02/21/2015  Primary Care Provider: No primary care provider on file. Primary Cardiologist: New (Dr. Anne Fu)  Discharge Diagnoses    Second degree heart block   Unstable angina (HCC)   Diabetes mellitus with renal manifestation (HCC)   Chronic kidney disease, stage III (moderate)   Essential hypertension, poorly controlled   Elevated troponin level   Demand ischemia (HCC)   Chronic diastolic heart failure   Hypomagnesemia   Hyperlipidemia   Malnutrition    Allergies No Known Allergies  Consultant: Nutrition  Procedures  Echo 02/19/15 Indications:   Chest pain 786.51.  ------------------------------------------------------------------- History:  PMH: Unstable angina. Diabetes with renal manifestations. Risk factors: Hypertension.  ------------------------------------------------------------------- Study Conclusions  - Left ventricle: The cavity size was normal. Features are consistent with a pseudonormal left ventricular filling pattern, with concomitant abnormal relaxation and increased filling pressure (grade 2 diastolic dysfunction). - Aortic valve: There was very mild stenosis. There was mild regurgitation. Valve area (VTI): 1.48 cm^2. Valve area (Vmax): 1.47 cm^2. Valve area (Vmean): 1.67 cm^2. - Mitral valve: There was mild regurgitation. - Right atrium: The atrium was moderately dilated.  History of Present Illness  Tyler Ray is an 81 y.o. [place] male with a history of DM, HTN, gout and high cholesterol who presented to [facility] complaining of intermittent, moderate bilateral lower chest pain that occurs with unproductive cough and started 2 weeks ago. Information was collected from caregivers and from the patient with the assistance of phone interpreter. His granddaughter states dizziness and SOB  that occurs with walking as associated symptoms. She also notes baseline bilateral leg swelling that becomes worse at night and is unchanged today. Pt's wife tried coining his back with no relief [ritual where coins are rubbed on the skin]. Pt currently takes Lisinopril, Metformin and Simvastatin. He quit smoking 30 years ago after he was hospitalized for an infection that he felt was related to his smoking habit; he started at age 37 and rolled his cigarettes in [place] but smoked 1 pack/day here in the [place]. Pt's granddaughter denies a history of MI or CAD, and patient denies history of CVA. 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 He currently lives in [place] though was sent here for medical care. EKG in ED showed Second-degree, Type 1 heart block.  Troponin 0.04. Pt was transferred to [facility] for [facility] evaluation. He is not on any AV nodal blocking agents.  Hospital Course  The patient was admitted for further management. Multitude of symptoms ranging from possible chest pain, leg pain, vomiting. Minimally elevated troponin likely demand ischemia in the setting of highly elevated blood pressure.    Second-degree Type 1 heart block: As noted on admission EKG though appears transient on telemetry. HR  borderline bradycardic with mostly 50s-60s. Suspect age-related fibrosis around AV node as etiology. Avoid AV blocking agent.   Chronic diastolic heart failure: Grade 2 diastolic dysfunction as noted on echo with mild AS; mild MR and moderately dilated RA. Net 7lb weight loss with net I&O of negative 3.7L. No B-blocker as noted above. Creatinine continued to worse, highest 1.91 after IV lasix and lasix po . Seems over dry. Reducing dose to  at discharged.   Acute monoarticular arthritis: His leg swelling improved after diuresis bilaterally though felt he was unable to walk as a  consequence of the pain he is experiencing in his left ankle. He  had similar episode few years ago. Seen by orthopedic. Xray showed evidence of some degenerative joint changes but no osteomyelitis changes. Cam Walker boot to rest ankle. Prednisone 30mg  to be tapered 5mg  every 2 days thereafter. PT recommended Rolling walker with 5" wheels and supervision for mobility.   Hyperlipidemia: No lipid panel on file. Resumed home dose. Consider lipid panel as outpatient.   Malnutrition: He was found to have moderate malnutrition related to chronic condition. TSH normal.   Poorly controlled HTN: Systolic BP 160s-200s. Now improved. Most recent reading 134/78.  Continue lisinopril 20mg , Norvasc 10mg  and hydralazine 50mg  TID. Lasix 20mg  qd. Can consider reducing lisinopril if continue to worsen kidney function.   Type 2 diabetes: A1c 8.6. CBGs trending mid 150s-200s.  -was on SSI with HS correction during admission.  Resume home metformin at discharge and close f/u with PCP.   CKD Stage 3: GFR trending in th 30s since admission consistent with Crt 1.6-1.8. At discharge 1.91. Reduced lasix to 20mg  qd. Will need BMET during post hospital appointment.   She has been seen by Dr. Anne FuSkains today and deemed ready for discharge home. All follow-up appointments have been scheduled. Discharge medications are listed below.   Discharge Vitals Blood pressure 174/56, pulse 47, temperature 98.6 F (37 C), temperature source Oral, resp. rate 18, height 5\' 3"  (1.6 m), weight 143 lb 14.4 oz (65.273 kg), SpO2 94 %.  Filed Weights   02/19/15 0500 02/20/15 0400 02/21/15 0549  Weight: 150 lb 5.7 oz (68.2 kg) 146 lb 6.2 oz (66.4 kg) 143 lb 14.4 oz (65.273 kg)    Labs  CBC  Recent Labs  02/20/15 0332 02/21/15 0510  WBC 11.5* 9.7  NEUTROABS 7.5 8.2*  HGB 12.0* 13.7  HCT 35.4* 40.0  MCV 90.8 89.5  PLT 194 247   Basic Metabolic Panel  Recent Labs  02/20/15 0332 02/21/15 0510  NA 137 130*  K 4.3 4.5  CL 102 95*  CO2 28 26  GLUCOSE 149* 237*  BUN 16 22*  CREATININE  1.82* 1.91*  CALCIUM 8.9 9.0  MG 1.7 1.8   Cardiac Enzymes  Recent Labs  02/18/15 1828  TROPONINI 0.04*    Hemoglobin A1C  Recent Labs  02/18/15 2258  HGBA1C 8.6*   Thyroid Function Tests  Recent Labs  02/18/15 2258  TSH 1.516    Disposition  Pt is being discharged home today in good condition.  Follow-up Plans & Appointments  Follow-up Information    Follow up with Shelda PalLIN,MATTHEW D, MD In 3 weeks.   Specialty:  Orthopedic Surgery   Why:  As needed, If symptoms worsen   Contact information:   [address] [place] [address] [phone]       Follow up with PCP. Go on 03/20/2015.   Why:  Appoint[facility] at 10:45 [facility]--phone [phone]   Contact information:   establish care with PCP for DM managment-- appointment [facility]- on Dec. 15 at 10:45-- please call if you need to change or cancel appointment      Follow up with [facility].   Why:  rolling walker arranged- to be delivered to room prior to discharge   Contact information:   [address] [place] [address] [phone]       Follow up with SIMMONS, BRITTAINY, PA-C.  Go on 03/07/2015.   Specialties:  Cardiology, Radiology   Why:  :00pm for post hospital   Contact information:   [address] [place] [address] [phone]           Discharge Instructions    Diet - low sodium heart healthy    Complete by:  As directed      Increase activity slowly    Complete by:  As directed          F/u Labs/Studies: Bmet during post hospital visit.   Discharge Medications    Medication List    TAKE these medications        ALEVE 220 MG Caps  Generic drug:  Naproxen Sodium  Take 220 mg by mouth every 8 (eight) hours as needed (pain).     amLODipine 10 MG tablet  Commonly known as:  NORVASC  Take 1 tablet (10 mg total) by mouth daily.     aspirin 81 MG EC  tablet  Take 1 tablet (81 mg total) by mouth daily.     furosemide 20 MG tablet  Commonly known as:  LASIX  Take 1 tablet (20 mg total) by mouth daily.     hydrALAZINE 50 MG tablet  Commonly known as:  APRESOLINE  Take 1 tablet (50 mg total) by mouth every 8 (eight) hours.     lisinopril 20 MG tablet  Commonly known as:  PRINIVIL,ZESTRIL  Take 20 mg by mouth daily.     metFORMIN 1000 MG tablet  Commonly known as:  GLUCOPHAGE  Take 1,000 mg by mouth 2 (two) times daily with a meal.     predniSONE 10 MG tablet  Commonly known as:  DELTASONE  Take 2.5 tablets (total ) by mouth for two days (11/19 & 11/20) then 2 tables (total ) for two days (11/21 & 11/22) then 1.5 tablets (total ) for two days (11/23 & 11/24) then 1 tablet (total ) for two days (11/25 & 11/26) and then 0.5 tablet (total ) for two days (11/27 & 11/28) and stop  Start taking on:  02/22/2015     simvastatin 10 MG tablet  Commonly known as:  ZOCOR  Take 10 mg by mouth daily.        Duration of Discharge Encounter   Greater than 30 minutes including physician time.  Signed, Bhagat,Bhavinkumar PA-C 02/21/2015, 11:36 AM   Personally seen and examined. Agree with above. Creatinine has increased today, likely a result of Lasix and demonstration. We will decrease Lasix to 20 mg once a day. Blood pressure under much better control. Appreciate orthopedic recommendations. Prednisone taper. Cam walker boot.  Transient 2-1 heart block noted. First-degree AV block noted. He has had second-degree heart block type I previously. Avoid beta blocker, other AV nodal blockade  OK for DC home. Needs close follow-up with his doctor in [place].  Donato Schultz, MD

## 2015-02-21 NOTE — Discharge Instructions (Signed)
Influenza Virus Vaccine injection What is this medicine? INFLUENZA VIRUS VACCINE (in floo EN zuh VAHY ruhs vak SEEN) helps to reduce the risk of getting influenza also known as the flu. The vaccine only helps protect you against some strains of the flu. This medicine may be used for other purposes; ask your health care provider or pharmacist if you have questions. What should I tell my health care provider before I take this medicine? They need to know if you have any of these conditions: -bleeding disorder like hemophilia -fever or infection -Guillain-Barre syndrome or other neurological problems -immune system problems -infection with the human immunodeficiency virus (HIV) or AIDS -low blood platelet counts -multiple sclerosis -an unusual or allergic reaction to influenza virus vaccine, latex, other medicines, foods, dyes, or preservatives. Different brands of vaccines contain different allergens. Some may contain latex or eggs. Talk to your doctor about your allergies to make sure that you get the right vaccine. -pregnant or trying to get pregnant -breast-feeding How should I use this medicine? This vaccine is for injection into a muscle or under the skin. It is given by a health care professional. A copy of Vaccine Information Statements will be given before each vaccination. Read this sheet carefully each time. The sheet may change frequently. Talk to your healthcare provider to see which vaccines are right for you. Some vaccines should not be used in all age groups. Overdosage: If you think you have taken too much of this medicine contact a poison control center or emergency room at once. NOTE: This medicine is only for you. Do not share this medicine with others. What if I miss a dose? This does not apply. What may interact with this medicine? -chemotherapy or radiation therapy -medicines that lower your immune system like etanercept, anakinra, infliximab, and adalimumab -medicines  that treat or prevent blood clots like warfarin -phenytoin -steroid medicines like prednisone or cortisone -theophylline -vaccines This list may not describe all possible interactions. Give your health care provider a list of all the medicines, herbs, non-prescription drugs, or dietary supplements you use. Also tell them if you smoke, drink alcohol, or use illegal drugs. Some items may interact with your medicine. What should I watch for while using this medicine? Report any side effects that do not go away within 3 days to your doctor or health care professional. Call your health care provider if any unusual symptoms occur within 6 weeks of receiving this vaccine. You may still catch the flu, but the illness is not usually as bad. You cannot get the flu from the vaccine. The vaccine will not protect against colds or other illnesses that may cause fever. The vaccine is needed every year. What side effects may I notice from receiving this medicine? Side effects that you should report to your doctor or health care professional as soon as possible: -allergic reactions like skin rash, itching or hives, swelling of the face, lips, or tongue Side effects that usually do not require medical attention (report to your doctor or health care professional if they continue or are bothersome): -fever -headache -muscle aches and pains -pain, tenderness, redness, or swelling at the injection site -tiredness This list may not describe all possible side effects. Call your doctor for medical advice about side effects. You may report side effects to FDA at 1-800-FDA-1088. Where should I keep my medicine? The vaccine will be given by a health care professional in a clinic, pharmacy, doctor's office, or other health care setting. You will not   be given vaccine doses to store at home. NOTE: This sheet is a summary. It may not cover all possible information. If you have questions about this medicine, talk to your  doctor, pharmacist, or health care provider.    2016, Elsevier/Gold Standard. (2014-10-11 10:07:28) Pneumococcal Vaccine, Polyvalent solution for injection What is this medicine? PNEUMOCOCCAL VACCINE, POLYVALENT (NEU mo KOK al vak SEEN, pol ee VEY luhnt) is a vaccine to prevent pneumococcus bacteria infection. These bacteria are a major cause of ear infections, Strep throat infections, and serious pneumonia, meningitis, or blood infections worldwide. These vaccines help the body to produce antibodies (protective substances) that help your body defend against these bacteria. This vaccine is recommended for people 2 years of age and older with health problems. It is also recommended for all adults over 50 years old. This vaccine will not treat an infection. This medicine may be used for other purposes; ask your health care provider or pharmacist if you have questions. What should I tell my health care provider before I take this medicine? They need to know if you have any of these conditions: -bleeding problems -bone marrow or organ transplant -cancer, Hodgkin's disease -fever -infection -immune system problems -low platelet count in the blood -seizures -an unusual or allergic reaction to pneumococcal vaccine, diphtheria toxoid, other vaccines, latex, other medicines, foods, dyes, or preservatives -pregnant or trying to get pregnant -breast-feeding How should I use this medicine? This vaccine is for injection into a muscle or under the skin. It is given by a health care professional. A copy of Vaccine Information Statements will be given before each vaccination. Read this sheet carefully each time. The sheet may change frequently. Talk to your pediatrician regarding the use of this medicine in children. While this drug may be prescribed for children as young as 2 years of age for selected conditions, precautions do apply. Overdosage: If you think you have taken too much of this medicine  contact a poison control center or emergency room at once. NOTE: This medicine is only for you. Do not share this medicine with others. What if I miss a dose? It is important not to miss your dose. Call your doctor or health care professional if you are unable to keep an appointment. What may interact with this medicine? -medicines for cancer chemotherapy -medicines that suppress your immune function -medicines that treat or prevent blood clots like warfarin, enoxaparin, and dalteparin -steroid medicines like prednisone or cortisone This list may not describe all possible interactions. Give your health care provider a list of all the medicines, herbs, non-prescription drugs, or dietary supplements you use. Also tell them if you smoke, drink alcohol, or use illegal drugs. Some items may interact with your medicine. What should I watch for while using this medicine? Mild fever and pain should go away in 3 days or less. Report any unusual symptoms to your doctor or health care professional. What side effects may I notice from receiving this medicine? Side effects that you should report to your doctor or health care professional as soon as possible: -allergic reactions like skin rash, itching or hives, swelling of the face, lips, or tongue -breathing problems -confused -fever over 102 degrees F -pain, tingling, numbness in the hands or feet -seizures -unusual bleeding or bruising -unusual muscle weakness Side effects that usually do not require medical attention (report to your doctor or health care professional if they continue or are bothersome): -aches and pains -diarrhea -fever of 102 degrees F or less -headache -  irritable -loss of appetite -pain, tender at site where injected -trouble sleeping This list may not describe all possible side effects. Call your doctor for medical advice about side effects. You may report side effects to FDA at 1-800-FDA-1088. Where should I keep my  medicine? This does not apply. This vaccine is given in a clinic, pharmacy, doctor's office, or other health care setting and will not be stored at home. NOTE: This sheet is a summary. It may not cover all possible information. If you have questions about this medicine, talk to your doctor, pharmacist, or health care provider.    2016, Elsevier/Gold Standard. (2007-10-27 14:32:37)  

## 2015-02-21 NOTE — Care Management Note (Signed)
Case ManagCase Management Note Previous CM note initiated by Dorcas Carroweborah Dowell RN CM  Patient Details  Name: Minerva FesterLohn Banbury MRN: [id number] Date of Birth: 07/11/1926  Subjective/Objective:      admn w angina, htn              Action/Plan: lives w fam   Expected Discharge Date:     02/21/15             Expected Discharge Plan:  Home/Self Care  In-House Referral:     Discharge planning Services  CM Consult  Post Acute Care Choice:  Durable Medical Equipment Choice offered to:  Patient  DME Arranged:  Dan HumphreysWalker rolling DME Agency:  [facility]  HH Arranged:  NA HH Agency:  NA  Status of Service:  Completed, signed off  Medicare Important Message Given:    Date Medicare IM Given:    Medicare IM give by:    Date Additional Medicare IM Given:    Additional Medicare Important Message give by:     If discussed at Long Length of Stay Meetings, dates discussed:    Additional Comments: ur review done  02/21/15- Donn PieriniKristi Masato Pettie RN, BSN - pt for d/c - per PT recommendations- RW arranged- [facility] RW to be delivered to room prior to discharge. No HH needs recommended   Zenda AlpersWebster, Lenn SinkKristi Hall, RN 02/21/2015, 11:04 AM

## 2015-02-21 NOTE — Care Management Important Message (Signed)
Important Message  Patient Details  Name: Tyler Ray Minerva FesterMRN: [id number] Date of Birth: 08/15/1926   Medicare Important Message Given:  Yes    [name] 02/21/2015, 11:50 AM

## 2015-02-24 ENCOUNTER — Telehealth: Payer: Self-pay | Admitting: Cardiovascular Disease

## 2015-02-24 NOTE — Telephone Encounter (Signed)
D/ C phone call .Marland Kitchen. Appt is on 03/07/15 at 3pm w/ Boyce MediciBrittany Simmons [facility] [address] [facility]   Thanks

## 2015-02-24 NOTE — Telephone Encounter (Signed)
[facility].  Patient understands to follow up with provider Robbie LisBrittainy Simmons, PA-C on 03[facility]07/[facility]pm [facility] Patient understands discharge instructions? yes Patient understands medications and regiment? yes Patient understands to bring all medications to this visit? yes

## 2015-03-07 ENCOUNTER — Encounter: Payer: Medicare Other | Admitting: Cardiology

## 2015-03-07 DIAGNOSIS — R0989 Other specified symptoms and signs involving the circulatory and respiratory systems: Secondary | ICD-10-CM

## 2015-03-20 ENCOUNTER — Ambulatory Visit: Payer: Medicare Other | Admitting: Family Medicine

## 2015-04-06 HISTORY — PX: OTHER SURGICAL HISTORY: SHX169

## 2015-04-27 DIAGNOSIS — I441 Atrioventricular block, second degree: Secondary | ICD-10-CM

## 2015-04-27 HISTORY — DX: Atrioventricular block, second degree: I44.1

## 2015-05-08 DIAGNOSIS — I5031 Acute diastolic (congestive) heart failure: Secondary | ICD-10-CM

## 2015-05-08 HISTORY — DX: Acute diastolic (congestive) heart failure: I50.31

## 2015-05-26 ENCOUNTER — Inpatient Hospital Stay
Admission: EM | Admit: 2015-05-26 | Discharge: 2015-06-03 | DRG: 292 | Disposition: A | Discharge status: Home / Self Care | Payer: Medicare Other | Attending: Internal Medicine | Admitting: Internal Medicine

## 2015-05-26 ENCOUNTER — Emergency Department: Payer: Medicare Other

## 2015-05-26 ENCOUNTER — Inpatient Hospital Stay: Payer: Medicare Other | Admitting: Internal Medicine

## 2015-05-26 DIAGNOSIS — I129 Hypertensive chronic kidney disease with stage 1 through stage 4 chronic kidney disease, or unspecified chronic kidney disease: Secondary | ICD-10-CM | POA: Diagnosis present

## 2015-05-26 DIAGNOSIS — R0902 Hypoxemia: Secondary | ICD-10-CM | POA: Diagnosis present

## 2015-05-26 DIAGNOSIS — I509 Heart failure, unspecified: Secondary | ICD-10-CM

## 2015-05-26 DIAGNOSIS — N179 Acute kidney failure, unspecified: Secondary | ICD-10-CM | POA: Diagnosis present

## 2015-05-26 DIAGNOSIS — R001 Bradycardia, unspecified: Secondary | ICD-10-CM | POA: Diagnosis not present

## 2015-05-26 DIAGNOSIS — J811 Chronic pulmonary edema: Secondary | ICD-10-CM | POA: Diagnosis present

## 2015-05-26 DIAGNOSIS — K219 Gastro-esophageal reflux disease without esophagitis: Secondary | ICD-10-CM | POA: Diagnosis present

## 2015-05-26 DIAGNOSIS — J810 Acute pulmonary edema: Secondary | ICD-10-CM

## 2015-05-26 DIAGNOSIS — M7989 Other specified soft tissue disorders: Secondary | ICD-10-CM | POA: Diagnosis not present

## 2015-05-26 DIAGNOSIS — N184 Chronic kidney disease, stage 4 (severe): Secondary | ICD-10-CM | POA: Diagnosis present

## 2015-05-26 DIAGNOSIS — I081 Rheumatic disorders of both mitral and tricuspid valves: Secondary | ICD-10-CM | POA: Diagnosis present

## 2015-05-26 DIAGNOSIS — E785 Hyperlipidemia, unspecified: Secondary | ICD-10-CM | POA: Diagnosis present

## 2015-05-26 DIAGNOSIS — J189 Pneumonia, unspecified organism: Secondary | ICD-10-CM | POA: Insufficient documentation

## 2015-05-26 DIAGNOSIS — N189 Chronic kidney disease, unspecified: Secondary | ICD-10-CM

## 2015-05-26 DIAGNOSIS — R7989 Other specified abnormal findings of blood chemistry: Secondary | ICD-10-CM

## 2015-05-26 DIAGNOSIS — I10 Essential (primary) hypertension: Secondary | ICD-10-CM

## 2015-05-26 DIAGNOSIS — I248 Other forms of acute ischemic heart disease: Secondary | ICD-10-CM | POA: Diagnosis present

## 2015-05-26 DIAGNOSIS — R778 Other specified abnormalities of plasma proteins: Secondary | ICD-10-CM

## 2015-05-26 DIAGNOSIS — I5031 Acute diastolic (congestive) heart failure: Secondary | ICD-10-CM

## 2015-05-26 DIAGNOSIS — E1122 Type 2 diabetes mellitus with diabetic chronic kidney disease: Secondary | ICD-10-CM | POA: Diagnosis present

## 2015-05-26 DIAGNOSIS — Z794 Long term (current) use of insulin: Secondary | ICD-10-CM

## 2015-05-26 DIAGNOSIS — R062 Wheezing: Secondary | ICD-10-CM | POA: Diagnosis present

## 2015-05-26 DIAGNOSIS — I5033 Acute on chronic diastolic (congestive) heart failure: Principal | ICD-10-CM | POA: Diagnosis present

## 2015-05-26 DIAGNOSIS — I272 Other secondary pulmonary hypertension: Secondary | ICD-10-CM | POA: Diagnosis present

## 2015-05-26 DIAGNOSIS — I441 Atrioventricular block, second degree: Secondary | ICD-10-CM | POA: Diagnosis present

## 2015-05-26 DIAGNOSIS — E872 Acidosis: Secondary | ICD-10-CM | POA: Diagnosis not present

## 2015-05-26 DIAGNOSIS — D649 Anemia, unspecified: Secondary | ICD-10-CM | POA: Diagnosis present

## 2015-05-26 DIAGNOSIS — Z87891 Personal history of nicotine dependence: Secondary | ICD-10-CM

## 2015-05-26 DIAGNOSIS — R0602 Shortness of breath: Secondary | ICD-10-CM | POA: Diagnosis present

## 2015-05-26 HISTORY — DX: Acute pulmonary edema: J81.0

## 2015-05-26 LAB — CBC AND DIFFERENTIAL
Basophils Absolute Automated: 0.05 10*3/uL (ref 0.00–0.20)
Basophils Automated: 1 %
Eosinophils Absolute Automated: 0.6 10*3/uL (ref 0.00–0.70)
Eosinophils Automated: 9 %
Hematocrit: 28.6 % — ABNORMAL LOW (ref 42.0–52.0)
Hgb: 9.5 g/dL — ABNORMAL LOW (ref 13.0–17.0)
Immature Granulocytes Absolute: 0.01 10*3/uL
Immature Granulocytes: 0 %
Lymphocytes Absolute Automated: 1.32 10*3/uL (ref 0.50–4.40)
Lymphocytes Automated: 20 %
MCH: 30.3 pg (ref 28.0–32.0)
MCHC: 33.2 g/dL (ref 32.0–36.0)
MCV: 91.1 fL (ref 80.0–100.0)
MPV: 9.8 fL (ref 9.4–12.3)
Monocytes Absolute Automated: 0.52 10*3/uL (ref 0.00–1.20)
Monocytes: 8 %
Neutrophils Absolute: 3.94 10*3/uL (ref 1.80–8.10)
Neutrophils: 61 %
Platelets: 303 10*3/uL (ref 140–400)
RBC: 3.14 10*6/uL — ABNORMAL LOW (ref 4.70–6.00)
RDW: 13 % (ref 12–15)
WBC: 6.43 10*3/uL (ref 3.50–10.80)

## 2015-05-26 LAB — GLUCOSE WHOLE BLOOD - POCT
Whole Blood Glucose POCT: 140 mg/dL — ABNORMAL HIGH (ref 70–100)
Whole Blood Glucose POCT: 202 mg/dL — ABNORMAL HIGH (ref 70–100)

## 2015-05-26 LAB — URINALYSIS, REFLEX TO MICROSCOPIC EXAM IF INDICATED
Bilirubin, UA: NEGATIVE
Blood, UA: NEGATIVE
Glucose, UA: NEGATIVE
Ketones UA: NEGATIVE
Leukocyte Esterase, UA: NEGATIVE
Nitrite, UA: NEGATIVE
Protein, UR: 30 — AB
Specific Gravity UA: 1.006 (ref 1.001–1.035)
Urine pH: 6 (ref 5.0–8.0)
Urobilinogen, UA: NORMAL mg/dL

## 2015-05-26 LAB — B-TYPE NATRIURETIC PEPTIDE: B-Natriuretic Peptide: 643.4 pg/mL — ABNORMAL HIGH (ref 0.0–100.0)

## 2015-05-26 LAB — GFR: EGFR: 22.5

## 2015-05-26 LAB — COMPREHENSIVE METABOLIC PANEL
ALT: 26 U/L (ref 0–55)
AST (SGOT): 33 U/L (ref 5–34)
Albumin/Globulin Ratio: 0.9 (ref 0.9–2.2)
Albumin: 2.9 g/dL — ABNORMAL LOW (ref 3.5–5.0)
Alkaline Phosphatase: 110 U/L — ABNORMAL HIGH (ref 38–106)
Anion Gap: 8 (ref 5.0–15.0)
BUN: 42.6 mg/dL — ABNORMAL HIGH (ref 9.0–28.0)
Bilirubin, Total: 0.4 mg/dL (ref 0.2–1.2)
CO2: 26 mEq/L (ref 22–29)
Calcium: 8.6 mg/dL (ref 7.9–10.2)
Chloride: 103 mEq/L (ref 100–111)
Creatinine: 2.7 mg/dL — ABNORMAL HIGH (ref 0.7–1.3)
Globulin: 3.4 g/dL (ref 2.0–3.6)
Glucose: 150 mg/dL — ABNORMAL HIGH (ref 70–100)
Potassium: 3.9 mEq/L (ref 3.5–5.1)
Protein, Total: 6.3 g/dL (ref 6.0–8.3)
Sodium: 137 mEq/L (ref 136–145)

## 2015-05-26 LAB — TROPONIN I
Troponin I: 0.1 ng/mL — ABNORMAL HIGH (ref 0.00–0.09)
Troponin I: 0.08 ng/mL (ref 0.00–0.09)

## 2015-05-26 LAB — LACTIC ACID, PLASMA: Lactic Acid: 0.7 mmol/L (ref 0.2–2.0)

## 2015-05-26 MED ORDER — INSULIN LISPRO 100 UNIT/ML SC SOLN
1.0000 [IU] | Freq: Three times a day (TID) | SUBCUTANEOUS | Status: DC | PRN
Start: 2015-05-26 — End: 2015-06-03
  Administered 2015-05-27: 1 [IU] via SUBCUTANEOUS
  Administered 2015-05-27: 2 [IU] via SUBCUTANEOUS
  Administered 2015-05-28 (×3): 1 [IU] via SUBCUTANEOUS
  Administered 2015-05-29: 2 [IU] via SUBCUTANEOUS
  Administered 2015-05-30 – 2015-05-31 (×3): 1 [IU] via SUBCUTANEOUS
  Filled 2015-05-26 (×2): qty 3
  Filled 2015-05-26: qty 6
  Filled 2015-05-26 (×5): qty 3

## 2015-05-26 MED ORDER — BUMETANIDE 0.25 MG/ML IJ SOLN
1.0000 mg | Freq: Two times a day (BID) | INTRAMUSCULAR | Status: DC
Start: 2015-05-27 — End: 2015-05-31
  Administered 2015-05-27 – 2015-05-31 (×10): 1 mg via INTRAVENOUS
  Filled 2015-05-26 (×10): qty 10

## 2015-05-26 MED ORDER — HYDRALAZINE HCL 50 MG PO TABS
100.0000 mg | ORAL_TABLET | Freq: Three times a day (TID) | ORAL | Status: DC
Start: 2015-05-26 — End: 2015-06-03
  Administered 2015-05-26 – 2015-06-03 (×24): 100 mg via ORAL
  Filled 2015-05-26 (×24): qty 2

## 2015-05-26 MED ORDER — INSULIN LISPRO 100 UNIT/ML SC SOLN
1.0000 [IU] | Freq: Every evening | SUBCUTANEOUS | Status: DC | PRN
Start: 2015-05-26 — End: 2015-06-03
  Administered 2015-05-26 – 2015-06-01 (×4): 1 [IU] via SUBCUTANEOUS
  Filled 2015-05-26 (×4): qty 3

## 2015-05-26 MED ORDER — AMLODIPINE BESYLATE 5 MG PO TABS
10.0000 mg | ORAL_TABLET | Freq: Every day | ORAL | Status: DC
Start: 2015-05-27 — End: 2015-06-03
  Administered 2015-05-27 – 2015-06-03 (×8): 10 mg via ORAL
  Filled 2015-05-26 (×8): qty 2

## 2015-05-26 MED ORDER — ACETAMINOPHEN 325 MG PO TABS
650.0000 mg | ORAL_TABLET | ORAL | Status: DC | PRN
Start: 2015-05-26 — End: 2015-06-03
  Administered 2015-05-29 – 2015-06-02 (×5): 650 mg via ORAL
  Filled 2015-05-26 (×5): qty 2

## 2015-05-26 MED ORDER — GLUCOSE 40 % PO GEL
15.0000 g | ORAL | Status: DC | PRN
Start: 2015-05-26 — End: 2015-06-03

## 2015-05-26 MED ORDER — ATORVASTATIN CALCIUM 20 MG PO TABS
40.0000 mg | ORAL_TABLET | Freq: Every evening | ORAL | Status: DC
Start: 2015-05-26 — End: 2015-06-03
  Administered 2015-05-26 – 2015-06-02 (×8): 40 mg via ORAL
  Filled 2015-05-26 (×8): qty 2

## 2015-05-26 MED ORDER — DOXAZOSIN MESYLATE 4 MG PO TABS
4.0000 mg | ORAL_TABLET | Freq: Every evening | ORAL | Status: DC
Start: 2015-05-26 — End: 2015-06-03
  Administered 2015-05-27 – 2015-06-02 (×7): 4 mg via ORAL
  Filled 2015-05-26 (×8): qty 1

## 2015-05-26 MED ORDER — FUROSEMIDE 10 MG/ML IJ SOLN
40.0000 mg | Freq: Once | INTRAMUSCULAR | Status: DC
Start: 2015-05-26 — End: 2015-05-26

## 2015-05-26 MED ORDER — SODIUM CHLORIDE 0.9 % IV SOLN
INTRAVENOUS | Status: AC
Start: 2015-05-26 — End: 2015-05-26
  Administered 2015-05-26: 100 mL
  Filled 2015-05-26: qty 200

## 2015-05-26 MED ORDER — GLUCAGON 1 MG IJ SOLR (WRAP)
1.0000 mg | INTRAMUSCULAR | Status: DC | PRN
Start: 2015-05-26 — End: 2015-06-03

## 2015-05-26 MED ORDER — FUROSEMIDE 10 MG/ML IJ SOLN
40.0000 mg | Freq: Once | INTRAMUSCULAR | Status: AC
Start: 2015-05-26 — End: 2015-05-26
  Administered 2015-05-26: 40 mg via INTRAVENOUS
  Filled 2015-05-26: qty 4

## 2015-05-26 MED ORDER — DEXTROSE 50 % IV SOLN
25.0000 mL | INTRAVENOUS | Status: DC | PRN
Start: 2015-05-26 — End: 2015-06-03

## 2015-05-26 MED ORDER — ISOSORBIDE MONONITRATE ER 60 MG PO TB24
120.0000 mg | ORAL_TABLET | Freq: Every day | ORAL | Status: DC
Start: 2015-05-27 — End: 2015-06-03
  Administered 2015-05-27 – 2015-06-03 (×8): 120 mg via ORAL
  Filled 2015-05-26 (×8): qty 2

## 2015-05-26 MED ORDER — CEFTRIAXONE SODIUM 1 G IJ SOLR
1.0000 g | Freq: Once | INTRAMUSCULAR | Status: AC
Start: 2015-05-26 — End: 2015-05-26
  Administered 2015-05-26: 1 g via INTRAVENOUS
  Filled 2015-05-26: qty 1000

## 2015-05-26 MED ORDER — GUAIFENESIN 100 MG/5ML PO SOLN
200.0000 mg | ORAL | Status: DC | PRN
Start: 2015-05-26 — End: 2015-06-03
  Administered 2015-05-27 – 2015-06-03 (×15): 200 mg via ORAL
  Filled 2015-05-26 (×15): qty 10

## 2015-05-26 MED ORDER — ONDANSETRON HCL 4 MG/2ML IJ SOLN
4.0000 mg | Freq: Once | INTRAMUSCULAR | Status: DC | PRN
Start: 2015-05-26 — End: 2015-06-03

## 2015-05-26 MED ORDER — HEPARIN SODIUM (PORCINE) 5000 UNIT/ML IJ SOLN
5000.0000 [IU] | Freq: Three times a day (TID) | INTRAMUSCULAR | Status: DC
Start: 2015-05-26 — End: 2015-06-03
  Administered 2015-05-26 – 2015-06-03 (×23): 5000 [IU] via SUBCUTANEOUS
  Filled 2015-05-26 (×24): qty 1

## 2015-05-26 MED ORDER — SODIUM CHLORIDE 0.9 % IV SOLN
500.0000 mg | Freq: Once | INTRAVENOUS | Status: AC
Start: 2015-05-26 — End: 2015-05-26
  Administered 2015-05-26: 500 mg via INTRAVENOUS
  Filled 2015-05-26: qty 500

## 2015-05-26 MED ORDER — PANTOPRAZOLE SODIUM 40 MG PO TBEC
40.0000 mg | DELAYED_RELEASE_TABLET | Freq: Two times a day (BID) | ORAL | Status: DC
Start: 2015-05-26 — End: 2015-06-03
  Administered 2015-05-27 – 2015-06-03 (×15): 40 mg via ORAL
  Filled 2015-05-26 (×15): qty 1

## 2015-05-26 NOTE — ED Provider Notes (Addendum)
Physician/Midlevel provider first contact with patient: 05/26/15 1314         History     Chief Complaint   Patient presents with   . Shortness of Breath     Historian: Patient via daughter    Chief Complaint: Dyspnea, cough  Location: Chest  Timing: 2 days  Severity: Moderate to severe  Quality:    Modifying Factors: Nothing  Associated signs and symptoms: Fever, congestion  Context:      HPI: 82 year old male presenting with complaint of dyspnea with productive cough and fever over the past 2 days.  Symptoms have gradually progressed.  Was admitted to the hospital possibly one month ago for upper respiratory infection symptoms.  This was in [place].          Past Medical History   Diagnosis Date   . Diabetes mellitus    . Hypertension        Past Surgical History   Procedure Laterality Date   . Abdominal surgery         History reviewed. No pertinent family history.    Social  Social History   Substance Use Topics   . Smoking status: Former Research scientist (life sciences)   . Smokeless tobacco: None   . Alcohol Use: No       .     No Known Allergies    Home Medications           None on File           Review of Systems   Constitutional: Positive for fever, chills and fatigue.   HENT: Positive for congestion. Negative for sore throat.    Respiratory: Positive for cough and shortness of breath.    Cardiovascular: Negative for chest pain and palpitations.   Gastrointestinal: Negative for nausea, vomiting and abdominal pain.   All other systems reviewed and are negative.      Physical Exam    BP: 155/67 mmHg, Heart Rate: (!) 56, Temp: 100.3 F (37.9 C), Resp Rate: 20, SpO2: 90 %, Weight: 72.576 kg     Physical Exam   Constitutional: He is oriented to person, place, and time. He appears well-developed and well-nourished.   HENT:   Head: Normocephalic and atraumatic.   Eyes: Conjunctivae and EOM are normal. Right eye exhibits no discharge. Left eye exhibits no discharge. No scleral icterus.   Neck: Normal range of motion. Neck  supple. No JVD present. No tracheal deviation present.   Cardiovascular: Normal rate and regular rhythm.  Exam reveals no friction rub.    No murmur heard.  Pulmonary/Chest: No stridor. He is in respiratory distress. He has no wheezes. He has rales (left base).   Abdominal: Soft. He exhibits no distension. There is no tenderness. There is no rebound and no guarding.   Neurological: He is alert and oriented to person, place, and time.   Skin: Skin is warm and dry.   Psychiatric: He has a normal mood and affect. His behavior is normal. Judgment and thought content normal.         MDM and ED Course     ED Medication Orders     Start Ordered     Status Ordering Provider    05/26/15 1347 05/26/15 1347  sodium chloride 0.9 % infusion     Comments:  Created by cabinet override    Ordered     05/26/15 1315 05/26/15 1314  cefTRIAXone (ROCEPHIN) injection 1 g   Once     Route:  Intravenous  Ordered Dose: 1 g     Acknowledged Ashlyn Cabler    05/26/15 1315 05/26/15 1314  azithromycin (ZITHROMAX) 500 mg in sodium chloride 0.9 % 250 mL IVPB   Once     Route: Intravenous  Ordered Dose: 500 mg     Last MAR action:  New Bag Damaso Laday             MDM  Number of Diagnoses or Management Options  Diagnosis management comments: I, Regis Bill MD, have been the primary provider for [name] during this Emergency Dept visit.      DDx includes, but is not limited to: Influenza, pneumonia, bronchitis    Initial Plan: Labs, chest x-ray, IV fluids, EKG, antibiotics, admission, supplemental oxygen     patient with hypoxia clinical evidence of pneumonia.  Chest x-ray indicating congestive heart failure.  Patient is at risk for sepsis and additional cardiovascular compromise.  Will require admission for IV antibiotics and additional cardiac evaluation.    EKG Interpretation    This EKG Interpretation was documented by Regis Bill, the ED physician    Rhythm: First-degree AV block   Rate: normal  Axis: normal  Ectopy:  none  Conduction: Left anterior fascicular block  ST Segments: no acute change  T Waves: no acute change  Q Waves: none    Clinical Impression: Nonspecific EKG    Results     Procedure Component Value Units Date/Time    Troponin I [id number]  (Abnormal) Collected:  05/26/15 1323    Specimen Information:  Blood Updated:  05/26/15 1401     Troponin I 0.10 (H) ng/mL     Lactic acid x2, sepsis [id number] Collected:  05/26/15 1323    Specimen Information:  Blood Updated:  05/26/15 1350     Lactic acid 0.7 mmol/L     Narrative:      Cancel if the initial lactate level is < 2.0 mmol/L.    Rapid influenza A/B antigens [id number] Collected:  05/26/15 1320    Specimen Information:  Nasopharyngeal from Nasal Aspirate Updated:  05/26/15 1340    Narrative:      ORDER#: [id number]                                    ORDERED BY: Lowella Dandy  SOURCE: Nasal Aspirate                               COLLECTED:  05/26/15 13:20  ANTIBIOTICS AT COLL.:                                RECEIVED :  05/26/15 13:25  Influenza Rapid Antigen A&B                FINAL       05/26/15 13:40  05/26/15   Negative for Influenza A and B             Reference Range: Negative      CBC with differential [id number]  (Abnormal) Collected:  05/26/15 1324    Specimen Information:  Blood from Blood Updated:  05/26/15 1336     WBC 6.43 x10 3/uL      Hgb 9.5 (L) g/dL      Hematocrit 28.6 (L) %  Platelets 303 x10 3/uL      RBC 3.14 (L) x10 6/uL      MCV 91.1 fL      MCH 30.3 pg      MCHC 33.2 g/dL      RDW 13 %      MPV 9.8 fL      Neutrophils 61 %      Lymphocytes Automated 20 %      Monocytes 8 %      Eosinophils Automated 9 %      Basophils Automated 1 %      Immature Granulocyte 0 %      Neutrophils Absolute 3.94 x10 3/uL      Abs Lymph Automated 1.32 x10 3/uL      Abs Mono Automated 0.52 x10 3/uL      Abs Eos Automated 0.60 x10 3/uL      Absolute Baso Automated 0.05 x10 3/uL      Absolute Immature Granulocyte 0.01 x10 3/uL     Comprehensive metabolic panel  [id number] Collected:  05/26/15 1324    Specimen Information:  Blood Updated:  05/26/15 1332    B-type Natriuretic Peptide [id number] Collected:  05/26/15 1324    Specimen Information:  Blood Updated:  05/26/15 1332    Glucose Whole Blood - POCT [id number]  (Abnormal) Collected:  05/26/15 1326     POCT - Glucose Whole blood 140 (H) mg/dL Updated:  05/26/15 1328    Blood Culture Aerobic/Anaerobic #2 [id number] Collected:  05/26/15 1323    Specimen Information:  Arm from Blood Updated:  05/26/15 1324    Narrative:      1 BLUE+1 PURPLE    Blood Culture Aerobic/Anaerobic #1 [id number] Collected:  05/26/15 1323    Specimen Information:  Arm from Blood Updated:  05/26/15 1324    Narrative:      1 BLUE+1 PURPLE        Radiology Results (24 Hour)     Procedure Component Value Units Date/Time    XR Chest  AP Portable [id number] Collected:  05/26/15 1355    Order Status:  Completed Updated:  05/26/15 1401    Narrative:      HISTORY: Dyspnea.    COMPARISON: None.    EXAMINATION: Chest x-ray AP portable single view performed on  05/26/2015.    FINDINGS:   Atherosclerotic aortic calcifications are seen. The cardiac silhouette  is mildly enlarged. There is mild prominence of the pulmonary  vascularity. No focal consolidation, pneumothorax or large pleural  effusion.      Impression:        Cardiomegaly with mild pulmonary edema.     Lorenda Hatchet, MD   05/26/2015 1:57 PM                    Procedures    Clinical Impression & Disposition     Clinical Impression  Final diagnoses:   Pneumonia due to infectious organism, unspecified laterality, unspecified part of lung   Hypoxia   Elevated troponin   Acute congestive heart failure, unspecified congestive heart failure type        ED Disposition     Admit Admitting Physician: Stacey Drain [id number]  Diagnosis: Pneumonia [227785]  Estimated Length of Stay: > or = to 2 midnights  Tentative Discharge Plan?: Home or Self Care [1]  Patient Class: Inpatient [101]             New  Prescriptions    No medications on file  Regis Bill, MD  05/26/15 1407    Regis Bill, MD  05/26/15 1500

## 2015-05-26 NOTE — H&P (Signed)
[facility] Hospitalist H&P.      Date Time: 05/26/2015  5:09 PM  Patient Name:Marco Bruce  VN:[id number]  PCP: Harlow Ohms, MD  Admit Date:05/26/2015  Attending Physician:Dawnell Bryant, MD  Assessment/Plan     Principal Problem:    SOB (shortness of breath)  Active Problems:    ARF (acute renal failure)    Pulmonary edema    Acute pulmonary edema    1.  Dyspnea, possible pneumonia-patient does have bilateral infiltrates.  This is unclear whether the patient has edema or pneumonia.  We will get post calcitonin.  Patient received a dose of furosemide in the emergency room.  We will observe for improvement.  Echocardiogram was ordered.  Patient received a dose of antibiotics for pneumonia in the emergency room.  As patient does have wheezing, will start on duoneb treatments.  Interestingly, patient's EKG shows "pulmonary disease pattern". He does have a history of smoking, but quit 20 years ago.    2.  Questionable chronic kidney disease-patient's hematocrit is elevated.  Daughter notes that the patient had kidney issues during his hospitalization in [place].  Requested records from [facility].    3.  Diabetes mellitus-Place patient on sliding scale insulin.  Hemoglobin A1c checked.    4.  Elevated troponin-patient is without any cardiac symptoms.  We will check a trend.  We will get cardiology involved and possible heart failure.    5.  Anemia-patient is not showing any signs of bleeding.  We will check an iron profile, ferritin in the morning.    6.  Hypertension-patient is on hydralazine, amlodipine.  We will continue these.  Patient is also on isosorbide mononitrate, which I will continue.    DVT Prophylaxis: Heparin  Code Status: FULL  Disposition: Home  Prognosis: Fair.  Type of Admission: Inpatient.  Estimated Length of Stay (including stay in the ER receiving treatment): > 2 midnights.  Medical Necessity for stay: ? aki and fluid overload/pneumonia.    Chief Complaint:     Chief Complaint   Patient presents  with   . Shortness of Breath     History of Present Illness:   Marco Bruce is a 82 y.o. male who has history of Past Surgical History   Procedure Laterality Date   . Abdominal surgery      Past Medical History   Diagnosis Date   . Diabetes mellitus    . Hypertension     came with the chief complaint of shortness of breath and cough.  Daughter provides the history at the patient's request.  Patient has had a cough and shortness of breath that began over the weekend.  It has since progressed.  He does not describe any sputum production.  Patient's family does not note any orthopnea.  They deny sick contacts.  They states that he had a recent hospitalization in [place] for similar type of symptoms.  He was treated in the hospital and sent out.  She states that he had a "kidney problem".  Patient came back to stay with his daughter.  He denies any chest pain or palpitations.  Past Medical History:     Past Medical History   Diagnosis Date   . Diabetes mellitus    . Hypertension      Past Surgical History:     Past Surgical History   Procedure Laterality Date   . Abdominal surgery       Family History:   No family history of DM, CAD.  Social History:     History   Alcohol Use No     History   Drug Use No     History   Smoking status   . Former Smoker   Smokeless tobacco   . Not on file     Social History     Social History   . Marital Status: Married     Spouse Name: N/A   . Number of Children: N/A   . Years of Education: N/A     Social History Main Topics   . Smoking status: Former Research scientist (life sciences)   . Smokeless tobacco: None   . Alcohol Use: No   . Drug Use: No   . Sexual Activity: Not Asked     Other Topics Concern   . None     Social History Narrative   . None     Allergies:   No Known Allergies  Medications:     Current/Home Medications    AMLODIPINE (NORVASC) 10 MG TABLET    Take 10 mg by mouth daily.    ATORVASTATIN (LIPITOR) 40 MG TABLET    Take 40 mg by mouth every evening.    DOXAZOSIN (CARDURA) 4 MG TABLET    Take 4  mg by mouth nightly.    FUROSEMIDE (LASIX) 40 MG TABLET    Take 40 mg by mouth 2 (two) times daily.    HYDRALAZINE (APRESOLINE) 100 MG TABLET    Take 100 mg by mouth 3 (three) times daily.    ISOSORBIDE MONONITRATE (IMDUR) 120 MG 24 HR TABLET    Take 120 mg by mouth daily.    PANTOPRAZOLE (PROTONIX) 40 MG TABLET    Take 40 mg by mouth 2 (two) times daily.    SITAGLIPTIN (JANUVIA) 50 MG TABLET    Take 25 mg by mouth daily.       Review of Systems:     Review of Systems   Constitutional: Positive for chills. Negative for fever.   Respiratory: Positive for cough and shortness of breath. Negative for sputum production and wheezing.    Cardiovascular: Positive for leg swelling. Negative for chest pain and palpitations.   Gastrointestinal: Negative for heartburn, nausea, vomiting and abdominal pain.   Genitourinary: Negative for dysuria and urgency.   Musculoskeletal: Negative for back pain, joint pain and neck pain.   Skin: Negative for itching and rash.   Neurological: Positive for weakness.   All other systems reviewed and are negative.    Physical Exam:   Vitals reviewed   height is 1.575 m (5\' 2" ) and weight is 72.576 kg (160 lb). His oral temperature is 100.3 F (37.9 C). His blood pressure is 148/56 and his pulse is 50. His respiration is 22 and oxygen saturation is 94%.   Body mass index is 29.26 kg/(m^2).  Filed Vitals:    05/26/15 1359 05/26/15 1430 05/26/15 1530 05/26/15 1700   BP: 148/50 146/58 146/50 148/56   Pulse: 54 52 52 50   Temp:       TempSrc:       Resp:   22    Height:       Weight:       SpO2: 94% 93% 99% 94%     Intake and Output Summary (Last 24 hours) at Date Time  No intake or output data in the 24 hours ending 05/26/15 1709    Physical Exam   Constitutional: He is oriented to person, place, and time. No distress.  HENT:   Head: Normocephalic and atraumatic.   Eyes: Conjunctivae and EOM are normal. No scleral icterus.   Neck: Neck supple. No tracheal deviation present.   Cardiovascular: Normal  rate and regular rhythm.    No murmur heard.  Pulmonary/Chest: Effort normal. No respiratory distress. He has wheezes. He has no rales.   Abdominal: Soft. Bowel sounds are normal. He exhibits no distension and no mass. There is no tenderness.   Musculoskeletal: He exhibits edema. He exhibits no tenderness.   Lymphadenopathy:     He has no cervical adenopathy.   Neurological: He is alert and oriented to person, place, and time. No cranial nerve deficit. GCS score is 15.   Skin: Skin is warm. No rash noted. He is not diaphoretic. No pallor.       Coagulation Profile:          Medications:   No current facility-administered medications for this encounter.      Reviewed Medications  CBC review:   Recent Labs  Lab 05/26/15  1324   WBC 6.43   HGB 9.5*   HEMATOCRIT 28.6*   PLATELETS 303   MCV 91.1   RDW 13   NEUTROPHILS 61   LYMPHOCYTES AUTOMATED 20   EOSINOPHILS AUTOMATED 9   IMMATURE GRANULOCYTE 0   NEUTROPHILS ABSOLUTE 3.94   ABSOLUTE IMMATURE GRANULOCYTE 0.01         Chem Review:  Recent Labs  Lab 05/26/15  1324   SODIUM 137   POTASSIUM 3.9   CHLORIDE 103   CO2 26   BUN 42.6*   CREATININE 2.7*   GLUCOSE 150*   CALCIUM 8.6   BILIRUBIN, TOTAL 0.4   AST (SGOT) 33   ALT 26   ALKALINE PHOSPHATASE 110*        EKG   EKG reviewed   Labs:   Labs have been reviewed  Results     Procedure Component Value Units Date/Time    UA, Reflex to Microscopic [id number]  (Abnormal) Collected:  05/26/15 1502     Urine Type Clean Catch Updated:  05/26/15 1541     Color, UA Straw      Clarity, UA Clear      Specific Gravity UA 1.006      Urine pH 6.0      Leukocyte Esterase, UA Negative      Nitrite, UA Negative      Protein, UR 30 (A)      Glucose, UA Negative      Ketones UA Negative      Urobilinogen, UA Normal mg/dL      Bilirubin, UA Negative      Blood, UA Negative      RBC, UA 0-5 /hpf      WBC, UA 0-5 /hpf      Squamous Epithelial Cells, Urine 0-5 /hpf      Sperm, UA Present (A)     Procalcitonin [id number] Collected:  05/26/15 1324      Updated:  05/26/15 1429    B-type Natriuretic Peptide [id number]  (Abnormal) Collected:  05/26/15 1324    Specimen Information:  Blood Updated:  05/26/15 1421     B-Natriuretic Peptide 643.4 (H) pg/mL     Comprehensive metabolic panel [id number]  (Abnormal) Collected:  05/26/15 1324    Specimen Information:  Blood Updated:  05/26/15 1420     Glucose 150 (H) mg/dL      BUN 42.6 (H) mg/dL      Creatinine 2.7 (H) mg/dL  Sodium 137 mEq/L      Potassium 3.9 mEq/L      Chloride 103 mEq/L      CO2 26 mEq/L      Calcium 8.6 mg/dL      Protein, Total 6.3 g/dL      Albumin 2.9 (L) g/dL      AST (SGOT) 33 U/L      ALT 26 U/L      Alkaline Phosphatase 110 (H) U/L      Bilirubin, Total 0.4 mg/dL      Globulin 3.4 g/dL      Albumin/Globulin Ratio 0.9      Anion Gap 8.0     GFR [id number] Collected:  05/26/15 1324     EGFR 22.5 Updated:  05/26/15 1420    Troponin I [id number]  (Abnormal) Collected:  05/26/15 1323    Specimen Information:  Blood Updated:  05/26/15 1401     Troponin I 0.10 (H) ng/mL     Lactic acid x2, sepsis [id number] Collected:  05/26/15 1323    Specimen Information:  Blood Updated:  05/26/15 1350     Lactic acid 0.7 mmol/L     Narrative:      Cancel if the initial lactate level is < 2.0 mmol/L.    Rapid influenza A/B antigens [id number] Collected:  05/26/15 1320    Specimen Information:  Nasopharyngeal from Nasal Aspirate Updated:  05/26/15 1340    Narrative:      ORDER#: [id number]                                    ORDERED BY: Lowella Dandy  SOURCE: Nasal Aspirate                               COLLECTED:  05/26/15 13:20  ANTIBIOTICS AT COLL.:                                RECEIVED :  05/26/15 13:25  Influenza Rapid Antigen A&B                FINAL       05/26/15 13:40  05/26/15   Negative for Influenza A and B             Reference Range: Negative      CBC with differential [id number]  (Abnormal) Collected:  05/26/15 1324    Specimen Information:  Blood from Blood Updated:  05/26/15 1336     WBC 6.43  x10 3/uL      Hgb 9.5 (L) g/dL      Hematocrit 28.6 (L) %      Platelets 303 x10 3/uL      RBC 3.14 (L) x10 6/uL      MCV 91.1 fL      MCH 30.3 pg      MCHC 33.2 g/dL      RDW 13 %      MPV 9.8 fL      Neutrophils 61 %      Lymphocytes Automated 20 %      Monocytes 8 %      Eosinophils Automated 9 %      Basophils Automated 1 %      Immature Granulocyte 0 %      Neutrophils Absolute 3.94 x10  3/uL      Abs Lymph Automated 1.32 x10 3/uL      Abs Mono Automated 0.52 x10 3/uL      Abs Eos Automated 0.60 x10 3/uL      Absolute Baso Automated 0.05 x10 3/uL      Absolute Immature Granulocyte 0.01 x10 3/uL     Glucose Whole Blood - POCT [id number]  (Abnormal) Collected:  05/26/15 1326     POCT - Glucose Whole blood 140 (H) mg/dL Updated:  05/26/15 1328    Blood Culture Aerobic/Anaerobic #2 [id number] Collected:  05/26/15 1323    Specimen Information:  Arm from Blood Updated:  05/26/15 1324    Narrative:      1 BLUE+1 PURPLE    Blood Culture Aerobic/Anaerobic #1 [id number] Collected:  05/26/15 1323    Specimen Information:  Arm from Blood Updated:  05/26/15 1324    Narrative:      1 BLUE+1 PURPLE        Rads:   Radiology reports have been reviewed.  Radiology Results (24 Hour)     Procedure Component Value Units Date/Time    XR Chest  AP Portable [id number] Collected:  05/26/15 1355    Order Status:  Completed Updated:  05/26/15 1401    Narrative:      HISTORY: Dyspnea.    COMPARISON: None.    EXAMINATION: Chest x-ray AP portable single view performed on  05/26/2015.    FINDINGS:   Atherosclerotic aortic calcifications are seen. The cardiac silhouette  is mildly enlarged. There is mild prominence of the pulmonary  vascularity. No focal consolidation, pneumothorax or large pleural  effusion.      Impression:        Cardiomegaly with mild pulmonary edema.     Lorenda Hatchet, MD   05/26/2015 1:57 PM            Signed by: Stacey Drain, MD  05/26/2015 5:09 PM      *This note was generated by the Epic EMR system/ Dragon speech  recognition and may contain inherent errors or omissions not intended by the user. Grammatical errors, random word insertions, deletions, pronoun errors and incomplete sentences are occasional consequences of this technology due to software limitations. Not all errors are caught or corrected. If there are questions or concerns about the content of this note or information contained within the body of this dictation they should be addressed directly with the author for clarification.

## 2015-05-27 ENCOUNTER — Other Ambulatory Visit (INDEPENDENT_AMBULATORY_CARE_PROVIDER_SITE_OTHER): Payer: Self-pay

## 2015-05-27 ENCOUNTER — Inpatient Hospital Stay: Payer: Medicare Other

## 2015-05-27 ENCOUNTER — Ambulatory Visit (INDEPENDENT_AMBULATORY_CARE_PROVIDER_SITE_OTHER): Payer: Self-pay

## 2015-05-27 LAB — ECG 12-LEAD
Atrial Rate: 60 {beats}/min
Atrial Rate: 49 {beats}/min
P Axis: -6 degrees
P-R Interval: 224 ms
P-R Interval: 210 ms
Q-T Interval: 450 ms
Q-T Interval: 494 ms
QRS Duration: 116 ms
QRS Duration: 116 ms
QTC Calculation (Bezet): 450 ms
QTC Calculation (Bezet): 446 ms
R Axis: -61 degrees
R Axis: -57 degrees
T Axis: 37 degrees
T Axis: 8 degrees
Ventricular Rate: 60 {beats}/min
Ventricular Rate: 49 {beats}/min

## 2015-05-27 LAB — BASIC METABOLIC PANEL
Anion Gap: 8 (ref 5.0–15.0)
BUN: 42.5 mg/dL — ABNORMAL HIGH (ref 9.0–28.0)
CO2: 25 mEq/L (ref 22–29)
Calcium: 8.7 mg/dL (ref 7.9–10.2)
Chloride: 105 mEq/L (ref 100–111)
Creatinine: 2.4 mg/dL — ABNORMAL HIGH (ref 0.7–1.3)
Glucose: 151 mg/dL — ABNORMAL HIGH (ref 70–100)
Potassium: 4.1 mEq/L (ref 3.5–5.1)
Sodium: 138 mEq/L (ref 136–145)

## 2015-05-27 LAB — GLUCOSE WHOLE BLOOD - POCT
Whole Blood Glucose POCT: 146 mg/dL — ABNORMAL HIGH (ref 70–100)
Whole Blood Glucose POCT: 210 mg/dL — ABNORMAL HIGH (ref 70–100)
Whole Blood Glucose POCT: 158 mg/dL — ABNORMAL HIGH (ref 70–100)
Whole Blood Glucose POCT: 167 mg/dL — ABNORMAL HIGH (ref 70–100)

## 2015-05-27 LAB — GFR: EGFR: 25.7

## 2015-05-27 LAB — CBC
Hematocrit: 27.2 % — ABNORMAL LOW (ref 42.0–52.0)
Hgb: 8.8 g/dL — ABNORMAL LOW (ref 13.0–17.0)
MCH: 29.6 pg (ref 28.0–32.0)
MCHC: 32.4 g/dL (ref 32.0–36.0)
MCV: 91.6 fL (ref 80.0–100.0)
MPV: 9.9 fL (ref 9.4–12.3)
Platelets: 284 10*3/uL (ref 140–400)
RBC: 2.97 10*6/uL — ABNORMAL LOW (ref 4.70–6.00)
RDW: 13 % (ref 12–15)
WBC: 5.71 10*3/uL (ref 3.50–10.80)

## 2015-05-27 LAB — MAGNESIUM: Magnesium: 1.7 mg/dL (ref 1.6–2.6)

## 2015-05-27 LAB — PROCALCITONIN: Procalcitonin: <0.1 (ref 0.0–0.1)

## 2015-05-27 LAB — TROPONIN I: Troponin I: 0.08 ng/mL (ref 0.00–0.09)

## 2015-05-27 MED ORDER — ASPIRIN 81 MG PO TBEC
81.0000 mg | DELAYED_RELEASE_TABLET | Freq: Every day | ORAL | Status: DC
Start: 2015-05-27 — End: 2015-05-31
  Administered 2015-05-27 – 2015-05-31 (×5): 81 mg via ORAL
  Filled 2015-05-27 (×5): qty 1

## 2015-05-27 NOTE — Progress Notes (Signed)
05/27/15 1203   Patient Type   Within 30 Days of Previous Admission? No   Medicare focused diagnosis patient? CHF   Healthcare Decisions   Interviewed: Family   Name of interviewee if other than the pt: Nihar Serafino, grandson, [phone] (H)   Interviewee Contact Information: Maeson Rege, grandson, [phone] (H)   Orientation/Decision Making Abilities of Patient Other (coment)  (patient does not speak English so grands on provided the information )   Advance Directive Patient does not have advance directive   [place] Agent's Name Evyn Torbeck, daughter, [phone] Lemmie Evens) and  [phone])   Healthcare Agent's Phone Number Shauna Varelas, daughter, [phone] (H)   Additional Emergency Contacts? Levi Mulberry, grandson, [phone] (H)   Prior to admission   Prior level of function Ambulates with assistive device  (sometimes daughter assist the patient with shower if needed)   Type of Residence Private residence   [place]  (5 steps from [place] and then patient stays on the main level )   Have running water, electricity, heat, etc? Yes   Living Arrangements Children;Family members   How do you get to your MD appointments? daughter    How do you get your groceries? daughter    Who fixes your meals? daughter    Who does your laundry? daughter    Who picks up your prescriptions? daughter    Dressing Needs assistance   Grooming Needs assistance   Feeding Independent   Bathing Needs assistance   Toileting Needs assistance   DME Currently at [facility]   [place] None  (patient had home health in the past, not active with any agency at this time)   Prior SNF admission? (Detail) none   Prior Rehab admission? (Detail) none   Adult Protective Services (APS) involved? No   Discharge Planning   Support Systems Children;Spouse/significant other;Family members;Friends/neighbors   Patient expects to be discharged to: home with home health as per family's  preference.  [facility] care referral given to [facility], at [phone] due to focus dx    Anticipated [place] plan discussed with: Same as interviewed;Family   [place] discussion contact information: Kharee Honor, grandson, [phone] (H)   Follow up appointment scheduled? No   Reason no follow up scheduled? Other (comment)  (patient has no PCP listed, a list of [facility] group physicians left in the room for daughter as discussed.  she agreed for patient to have [place] discharge clinic appointment at this time)   Potential barriers to discharge: Other  (none)   Mode of transportation: Private car (family member)   Consults/Providers   PT Evaluation Needed 1   OT Evalulation Needed 1   SLP Evaluation Needed 2   Outcome Palliative Care Screen Screened but did not meet criteria for intervention   Correct PCP listed in Epic? No (comment)  (list of MDs given and family to find a doctor as told by the daughter)   Important Message from [facility] Notice   Patient received 1st IMM Letter? Yes   Date of most recent IMM given: 05/26/15

## 2015-05-27 NOTE — Consults (Signed)
[facility]    Date Time: 05/27/2015 9:38 AM  Patient Name: [facility]  Requesting Physician: Stacey Drain, MD           History:   Marco Bruce is a 82 y.o. male admitted on 05/26/2015, for whom we are asked to provide cardiac consultation, regarding sob. He presented with a few days of dry cough, sob, orthopnea, and lower edema. He denies fevers but does describe chest pain with coughing. He denies prior cardiac problems.     Past Medical History:     Past Medical History   Diagnosis Date   . Diabetes mellitus    . Hypertension        Past Surgical History:     Past Surgical History   Procedure Laterality Date   . Abdominal surgery         Family History:   History reviewed. No pertinent family history.    Social History:     Social History     Social History   . Marital Status: Married     Spouse Name: N/A   . Number of Children: N/A   . Years of Education: N/A     Social History Main Topics   . Smoking status: Former Research scientist (life sciences)   . Smokeless tobacco: Not on file   . Alcohol Use: No   . Drug Use: No   . Sexual Activity: Not on file     Other Topics Concern   . Not on file     Social History Narrative   . No narrative on file       Allergies:   No Known Allergies    Medications:     Current  Medication List          Details   amLODIPine (NORVASC) 10 MG tablet Take 10 mg by mouth daily.      atorvastatin (LIPITOR) 40 MG tablet Take 40 mg by mouth every evening.      doxazosin (CARDURA) 4 MG tablet Take 4 mg by mouth nightly.      furosemide (LASIX) 40 MG tablet Take 40 mg by mouth 2 (two) times daily.      hydrALAZINE (APRESOLINE) 100 MG tablet Take 100 mg by mouth 3 (three) times daily.      isosorbide mononitrate (IMDUR) 120 MG 24 hr tablet Take 120 mg by mouth daily.      pantoprazole (PROTONIX) 40 MG tablet Take 40 mg by mouth 2 (two) times daily.      SITagliptin (JANUVIA) 50 MG tablet Take 25 mg by mouth daily.               Review of Systems:    Comprehensive review  of systems including constitutional, eyes, ears, nose, mouth, throat, cardiovascular, GI, GU, musculoskeletal, integumentary, respiratory, neurologic, psychiatric, and endocrine is negative other than what is mentioned already in the history of present illness    Physical Exam:     Filed Vitals:    05/27/15 0509   BP: 154/67   Pulse: 47   Temp: 99 F (37.2 C)   Resp: 19   SpO2: 95%     Temp (24hrs), Avg:99.5 F (37.5 C), Min:98.6 F (37 C), Max:100.3 F (37.9 C)      GENERAL: Patient is in no acute distress   HEENT: No conjunctival lesions, no pallor of the oral mucosa  NECK: +jugular venous distention, normal carotid upstrokes without bruits   CARDIAC: Normal apical  impulse, regular rate and rhythm, with normal S1 and S2, no murmurs, rubs, or gallops   CHEST:  Bilateral rales, normal respiratory effort  ABDOMEN: No abdominal bruits, soft, nontender, no increased abdominal aorta  EXTREMITIES: No clubbing, cyanosis, or edema, 2+ femoral pulses bilaterally without bruits  SKIN: No lesions  NEUROLOGIC: Alert and oriented to time, place and person, normal mood and affect  MUSCULOSKELETAL: Normal muscle strength and tone.      Labs Reviewed:       Recent Labs  Lab 05/27/15  0031 05/26/15  1913 05/26/15  1323   TROPONIN I 0.08 0.08 0.10*               Recent Labs  Lab 05/26/15  1324   ALT 26   AST (SGOT) 33       Recent Labs  Lab 05/27/15  0608   MAGNESIUM 1.7           Recent Labs  Lab 05/27/15  0608 05/26/15  1324   WBC 5.71 6.43   HGB 8.8* 9.5*   HEMATOCRIT 27.2* 28.6*   PLATELETS 284 303       Recent Labs  Lab 05/27/15  0608 05/26/15  1324   SODIUM 138 137   POTASSIUM 4.1 3.9   CHLORIDE 105 103   CO2 25 26   BUN 42.5* 42.6*   CREATININE 2.4* 2.7*   EGFR 25.7 22.5   GLUCOSE 151* 150*     Lab Results   Component Value Date    BNP 643.4* 05/26/2015     Estimated Creatinine Clearance: 19.9 mL/min (based on Cr of 2.4).  EKG: normal sinus rhythm, RBBB, 1st degree AV block, lad.  Radiology      Radiology Results (24  Hour)     Procedure Component Value Units Date/Time    XR Chest  AP Portable [id number] Collected:  05/26/15 1355    Order Status:  Completed Updated:  05/26/15 1401    Narrative:      HISTORY: Dyspnea.    COMPARISON: None.    EXAMINATION: Chest x-ray AP portable single view performed on  05/26/2015.    FINDINGS:   Atherosclerotic aortic calcifications are seen. The cardiac silhouette  is mildly enlarged. There is mild prominence of the pulmonary  vascularity. No focal consolidation, pneumothorax or large pleural  effusion.      Impression:        Cardiomegaly with mild pulmonary edema.     Lorenda Hatchet, MD   05/26/2015 1:57 PM          Assessment:    Sob due to acute chf: etiology?   ?pneumonia   Dm   Anemia   htn   Hyperlipidemia   Cri: stage 4   Elevated troponin, likely demand ischemia    Recommendations:    Iv diuresis   Check echo   Add asa 81mg  po daily   Eventual ischemic evaluation            Signed by: Velora Mediate, M.D., [place]  NP [place] (8am-5pm)  MD [employer] [phone] (8am-5pm)  After hours, non urgent consult line [phone]  After Hours, urgent consults [phone]

## 2015-05-27 NOTE — Plan of Care (Signed)
Problem: Health Promotion  Goal: Knowledge - disease process  Extent of understanding conveyed about a specific disease process.   Outcome: Progressing  .    Problem: Safety  Goal: Patient will be free from injury during hospitalization  Outcome: Progressing  .  Intervention: Assess patient's risk for falls and implement fall prevention plan of care per policy  .  Intervention: Provide and maintain safe environment  .  Intervention: Use appropriate transfer methods  .  Intervention: Ensure appropriate safety devices are available at the bedside  .  Intervention: Include patient/family/caregiver in decisions related to safety  .  Intervention: Hourly rounding.  Pt alert & verbally responsive. Oriented x 3. Speaks cambodian only. Assessment done, denies chest pain, no sob. Vitals stable at this time. Due meds  Given & well tolerated. Bed alarm on at all times. Needs attended. Call light within reach. Will continue to monitor.

## 2015-05-27 NOTE — Plan of Care (Signed)
Problem: Safety  Goal: Patient will be free from injury during hospitalization  Sr's up x2. BA on. Call bell within reach. Pt's family supportive at bedside and assists pt with needs. Hourly rounding for safety.     Problem: Pain  Goal: Patient's pain/discomfort is manageable  Pt denies any pain. Continuing to monitor pt for s/s of pain and discomfort.

## 2015-05-27 NOTE — Progress Notes (Signed)
[facility] Hospitalist Daily Progress Note        Date Time: 05/27/2015  5:25 PM  Patient Name:Marco Bruce  [id number]  PCP: Harlow Ohms, MD  Admit Date:05/26/2015  Attending Physician:Kaiyan Luczak, Maryjean Ka, MD  Length of stay:1    Chief Complaint:      Chief Complaint   Patient presents with   . Shortness of Breath     Subjective:   2/21 - Patient stated that he is feeling better. Yolanda Bonine is translating at the patient's request.   Assessment/Plan     Active Diagnosis: Principal Problem:    SOB (shortness of breath)  Active Problems:    ARF (acute renal failure)    Pulmonary edema    Acute pulmonary edema    1.  Pulmonary edema-patient has severe pulmonary hypertension on his echocardiogram.  Appreciate cardiology evaluation.  Will continue IV diuresis.  Echocardiogram reviewed.  Patient is an 81 mg aspirin, will need aspirin as an outpatient.  Patient has a normal ejection fraction.  Patient's pro-calcitonin is negative.  We will hold off on antibiotics for now.  Patient has not spiked a fever, nor has an elevated white blood cell count.    2.  Chronic kidney disease-patient's family notes that the patient had kidney issues in [place].  We will continue checking daily BMPs.  May need nephrology involvement.  Patient does have protein in his urine.  We will re-request records from [facility].  Will order a renal ultrasound    3.  Diabetes mellitus-we will continue sliding scale insulin.  We will check a hemoglobin A1c in the morning.    4.  Elevated troponin-patient was evaluated by cardiology.  Echocardiogram was checked.  Continue diuresis.  May need outpatient stress test.     5.  Anemia-patient is not showing any signs of bleeding.  Mild decrease from 9.5-8.8  Iron profile, ferritin in the morning.    6.  Hypertension-patient on hydralazine and amlodipine.  Patient is on isosorbide mononitrate.  We will keep his medication regimen the same at this time.    7.   Right upper shoulder swelling-will order duplex of the right upper extremity.    DVT Prophylaxis:heparin   Code Status: Full Code   Disposition: home  Prognosis: Fair  Type of Admission:Inpatient    Physical Exam:    height is 1.575 m (5\' 2" ) and weight is 77.565 kg (171 lb). His temporal artery temperature is 98.7 F (37.1 C). His blood pressure is 155/66 and his pulse is 51. His respiration is 22 and oxygen saturation is 95%.   Body mass index is 31.27 kg/(m^2).  Filed Vitals:    05/27/15 0106 05/27/15 0509 05/27/15 0951 05/27/15 1401   BP: 162/69 154/67 152/61 155/66   Pulse: 48 47 50 51   Temp: 98.6 F (37 C) 99 F (37.2 C) 98.9 F (37.2 C) 98.7 F (37.1 C)   TempSrc: Temporal Artery Temporal Artery Temporal Artery Temporal Artery   Resp: 22 19 20 22    Height:       Weight:  77.565 kg (171 lb)     SpO2: 93% 95% 94% 95%     Intake and Output Summary (Last 24 hours) at Date Time    Intake/Output Summary (Last 24 hours) at 05/27/15 1725  Last data filed at 05/27/15 1105   Gross per 24 hour   Intake    240 ml   Output   1550 ml   Net  -1310 ml  Physical Exam   Constitutional: He is oriented to person, place, and time. No distress.   HENT:   Head: Normocephalic and atraumatic.   Mouth/Throat: No oropharyngeal exudate.   Eyes: Conjunctivae and EOM are normal. No scleral icterus.   Neck: Neck supple. No tracheal deviation present.   Cardiovascular: Normal rate and regular rhythm.    No murmur heard.  Pulmonary/Chest: Breath sounds normal. No respiratory distress. He has no wheezes.   Abdominal: Soft. Bowel sounds are normal. He exhibits no distension. There is no tenderness.   Musculoskeletal: He exhibits edema. He exhibits no tenderness.   RUE   Neurological: He is alert and oriented to person, place, and time. GCS score is 15.   Skin: Skin is warm and dry. He is not diaphoretic.     Review of Systems:     Review of Systems   Constitutional: Negative for fever and chills.   HENT: Negative for sore throat.     Cardiovascular: Negative for chest pain and palpitations.   Gastrointestinal: Negative for heartburn, nausea and vomiting.   Musculoskeletal: Negative for back pain and joint pain.   Skin: Negative for itching and rash.   Neurological: Negative for headaches.   All other systems reviewed and are negative.     Coagulation Profile:          Medications:   Current Facility-Administered Medications   Medication Dose Route Frequency Last Rate Last Dose   . acetaminophen (TYLENOL) tablet 650 mg  650 mg Oral Q4H PRN       . amLODIPine (NORVASC) tablet 10 mg  10 mg Oral Daily   10 mg at 05/27/15 1028   . aspirin EC tablet 81 mg  81 mg Oral Daily   81 mg at 05/27/15 1028   . atorvastatin (LIPITOR) tablet 40 mg  40 mg Oral QPM   40 mg at 05/26/15 1857   . bumetanide (BUMEX) injection 1 mg  1 mg Intravenous BID   1 mg at 05/27/15 1519   . dextrose (GLUCOSE) 40 % oral gel 15 g of glucose  15 g of glucose Oral PRN        And   . dextrose 50 % bolus 25 mL  25 mL Intravenous PRN        And   . glucagon (rDNA) (GLUCAGEN) injection 1 mg  1 mg Intramuscular PRN       . doxazosin (CARDURA) tablet 4 mg  4 mg Oral QHS   4 mg at 05/26/15 2220   . guaiFENesin (ROBITUSSIN) oral solution 200 mg  200 mg Oral Q4H PRN   200 mg at 05/27/15 1633   . heparin (porcine) injection 5,000 Units  5,000 Units Subcutaneous Q8H [place]   5,000 Units at 05/27/15 1519   . hydrALAZINE (APRESOLINE) tablet 100 mg  100 mg Oral TID   100 mg at 05/27/15 1519   . insulin lispro (HumaLOG) injection 1-3 Units  1-3 Units Subcutaneous QHS PRN   1 Units at 05/26/15 2228   . insulin lispro (HumaLOG) injection 1-5 Units  1-5 Units Subcutaneous TID AC PRN   2 Units at 05/27/15 1252   . isosorbide mononitrate (IMDUR) 24 hr tablet 120 mg  120 mg Oral Daily   120 mg at 05/27/15 1028   . ondansetron (ZOFRAN) injection 4 mg  4 mg Intravenous Once PRN       . pantoprazole (PROTONIX) EC tablet 40 mg  40 mg Oral BID AC   40  mg at 05/27/15 0849        CBC review:   Recent Labs  Lab  05/27/15  0608 05/26/15  1324   WBC 5.71 6.43   HGB 8.8* 9.5*   HEMATOCRIT 27.2* 28.6*   PLATELETS 284 303   MCV 91.6 91.1   RDW 13 13   NEUTROPHILS  --  61   LYMPHOCYTES AUTOMATED  --  20   EOSINOPHILS AUTOMATED  --  9   IMMATURE GRANULOCYTE  --  0   NEUTROPHILS ABSOLUTE  --  3.94   ABSOLUTE IMMATURE GRANULOCYTE  --  0.01        Chem Review:  Recent Labs  Lab 05/27/15  0608 05/26/15  1324   SODIUM 138 137   POTASSIUM 4.1 3.9   CHLORIDE 105 103   CO2 25 26   BUN 42.5* 42.6*   CREATININE 2.4* 2.7*   GLUCOSE 151* 150*   CALCIUM 8.7 8.6   MAGNESIUM 1.7  --    BILIRUBIN, TOTAL  --  0.4   AST (SGOT)  --  33   ALT  --  26   ALKALINE PHOSPHATASE  --  110*        Labs:   I have reviewed the labs  Results     Procedure Component Value Units Date/Time    Blood Culture Aerobic/Anaerobic #2 [id number] Collected:  05/26/15 1323    Specimen Information:  Arm from Blood Updated:  05/27/15 1721    Narrative:      ORDER#: [id number]                                    ORDERED BY: POTLURI, JAGADI  SOURCE: Blood lt hand                                COLLECTED:  05/26/15 13:23  ANTIBIOTICS AT COLL.:                                RECEIVED :  05/26/15 17:17  Culture Blood Aerobic and Anaerobic        PRELIM      05/27/15 17:21  05/27/15   No Growth after 1 day/s of incubation.      Blood Culture Aerobic/Anaerobic #1 [id number] Collected:  05/26/15 1323    Specimen Information:  Arm from Blood Updated:  05/27/15 1721    Narrative:      ORDER#: [id number]                                    ORDERED BY: POTLURI, JAGADI  SOURCE: Blood lt ac                                  COLLECTED:  05/26/15 13:23  ANTIBIOTICS AT COLL.:                                RECEIVED :  05/26/15 17:18  Culture Blood Aerobic and Anaerobic        PRELIM      05/27/15 17:21  05/27/15   No  Growth after 1 day/s of incubation.      Glucose Whole Blood - POCT [id number]  (Abnormal) Collected:  05/27/15 1650     POCT - Glucose Whole blood 158 (H) mg/dL Updated:  05/27/15  1659    Glucose Whole Blood - POCT [id number]  (Abnormal) Collected:  05/27/15 1204     POCT - Glucose Whole blood 210 (H) mg/dL Updated:  05/27/15 1209    Procalcitonin [id number] Collected:  05/26/15 1324     Procalcitonin <0.1 Updated:  05/27/15 1047    Glucose Whole Blood - POCT [id number]  (Abnormal) Collected:  05/27/15 0746     POCT - Glucose Whole blood 146 (H) mg/dL Updated:  05/27/15 0804    GFR [id number] Collected:  05/27/15 0608     EGFR 25.7 Updated:  05/27/15 0000000    Basic Metabolic Panel [id number]  (Abnormal) Collected:  05/27/15 0608    Specimen Information:  Blood Updated:  05/27/15 0753     Glucose 151 (H) mg/dL      BUN 42.5 (H) mg/dL      Creatinine 2.4 (H) mg/dL      Calcium 8.7 mg/dL      Sodium 138 mEq/L      Potassium 4.1 mEq/L      Chloride 105 mEq/L      CO2 25 mEq/L      Anion Gap 8.0     Magnesium [id number] Collected:  05/27/15 0608    Specimen Information:  Blood Updated:  05/27/15 0753     Magnesium 1.7 mg/dL     CBC [id number]  (Abnormal) Collected:  05/27/15 [id number]    Specimen Information:  Blood from Blood Updated:  05/27/15 0732     WBC 5.71 x10 3/uL      Hgb 8.8 (L) g/dL      Hematocrit 27.2 (L) %      Platelets 284 x10 3/uL      RBC 2.97 (L) x10 6/uL      MCV 91.6 fL      MCH 29.6 pg      MCHC 32.4 g/dL      RDW 13 %      MPV 9.9 fL     Troponin I [id number] Collected:  05/27/15 0031    Specimen Information:  Blood Updated:  05/27/15 0116     Troponin I 0.08 ng/mL     Glucose Whole Blood - POCT [id number]  (Abnormal) Collected:  05/26/15 2050     POCT - Glucose Whole blood 202 (H) mg/dL Updated:  05/26/15 2057    Troponin I [id number] Collected:  05/26/15 1913    Specimen Information:  Blood Updated:  05/26/15 1958     Troponin I 0.08 ng/mL         Rads:   I have reviewed the radiological image.  Radiology Results (24 Hour)     ** No results found for the last 24 hours. **        Does patient have telemetry: yes Normal Sinus Rhythm 88    Signed by: Stacey Drain,  MD  05/27/2015 5:25 PM      *This note was generated by the Epic EMR system/ Dragon speech recognition and may contain inherent errors or omissions not intended by the user. Grammatical errors, random word insertions, deletions, pronoun errors and incomplete sentences are occasional consequences of this technology due to software limitations. Not all errors are caught or corrected. If there are questions or concerns about the content of this  note or information contained within the body of this dictation they should be addressed directly with the author for clarification.

## 2015-05-27 NOTE — UM Notes (Signed)
82 yo male to ED 05/26/15 1314 with complaint of dyspnea with productive cough and fever over the past 2 days. Symptoms have gradually progressed. Was admitted to the hospital possibly one month ago for upper respiratory infection symptoms. This was in [place].  T 100.3, HR 52, RR 24, BP 155/67, sats 90%  PMH : DM, HTN  H&H 9.5/28.6, RBC 3.14, Glucose 150, BUN 42.6, Cr 2.7, Alk Phos 110, Trop .10, BNP 643.4  UA + protein  Blood cx xs 2 : pending  Neg FLU  CXRY : Cardiomegaly with mild pulmonary edema.   ABN EKG : SINUS RHYTHM WITH 1ST DEGREE A-V BLOCK. PULMONARY DISEASE PATTERN. LEFT ANTERIOR FASCICULAR BLOCK.  On exam : wheezes, crackles, O2 2L NC  In ED : 1g IV Rocephin, 500mg  IV Zithromax, 40mg  IV Lasix  Admit : PNA w/ hypoxia and fever / Acute CHF / Elevated Trop    MD assessment/plan :  1. Dyspnea, possible pneumonia-patient does have bilateral infiltrates. This is unclear whether the patient has edema or pneumonia. We will get post calcitonin. Patient received a dose of furosemide in the emergency room. We will observe for improvement. Echocardiogram was ordered. Patient received a dose of antibiotics for pneumonia in the emergency room. As patient does have wheezing, will start on duoneb treatments. Interestingly, patient's EKG shows "pulmonary disease pattern". He does have a history of smoking, but quit 20 years ago.  2. Questionable chronic kidney disease-patient's hematocrit is elevated. Daughter notes that the patient had kidney issues during his hospitalization in [place]. Requested records from [place].  3. Diabetes mellitus-Place patient on sliding scale insulin. Hemoglobin A1c checked.  4. Elevated troponin-patient is without any cardiac symptoms. We will check a trend. We will get cardiology involved and possible heart failure.  5. Anemia-patient is not showing any signs of bleeding. We will check an iron profile, ferritin in the morning.  6.  Hypertension-patient is on hydralazine, amlodipine. We will continue these. Patient is also on isosorbide mononitrate, which I will continue.    Inpatient admission order 05/26/15 1407, tele, po Amlodipine, ASA, Lipitor, 1mg  IV Bumex BID, po Cardura, SQ Heparin 5000units q8, po Hydralazine, Imdur, Protonix, prn Tylenol, prn IV Zofran, sliding scale insulin, ECHO, Cardio, ck procalcitonin, serial CEs, diet as tol, activity as tol, asp prec, fall prec, I/O, supp O2, PTOT, VS q4

## 2015-05-27 NOTE — Plan of Care (Signed)
Found pt to be resting in bed, alert and oriented x4. Pt's family at bedside and able to translate needs. Pt denies pain. LS crackles and wheezes noted. No dyspnea or SOB at rest noted. Am assessment completed.

## 2015-05-28 ENCOUNTER — Inpatient Hospital Stay: Payer: Medicare Other

## 2015-05-28 DIAGNOSIS — N179 Acute kidney failure, unspecified: Secondary | ICD-10-CM

## 2015-05-28 DIAGNOSIS — J810 Acute pulmonary edema: Secondary | ICD-10-CM

## 2015-05-28 DIAGNOSIS — I5031 Acute diastolic (congestive) heart failure: Secondary | ICD-10-CM

## 2015-05-28 DIAGNOSIS — R0602 Shortness of breath: Secondary | ICD-10-CM

## 2015-05-28 DIAGNOSIS — N189 Chronic kidney disease, unspecified: Secondary | ICD-10-CM

## 2015-05-28 LAB — GLUCOSE WHOLE BLOOD - POCT
Whole Blood Glucose POCT: 171 mg/dL — ABNORMAL HIGH (ref 70–100)
Whole Blood Glucose POCT: 189 mg/dL — ABNORMAL HIGH (ref 70–100)
Whole Blood Glucose POCT: 174 mg/dL — ABNORMAL HIGH (ref 70–100)
Whole Blood Glucose POCT: 188 mg/dL — ABNORMAL HIGH (ref 70–100)

## 2015-05-28 LAB — IRON PROFILE
Iron Saturation: 16 % (ref 15–50)
Iron: 31 ug/dL — ABNORMAL LOW (ref 40–160)
TIBC: 199 ug/dL — ABNORMAL LOW (ref 261–462)
UIBC: 168 ug/dL (ref 126–382)

## 2015-05-28 LAB — BASIC METABOLIC PANEL
Anion Gap: 14 (ref 5.0–15.0)
BUN: 46.5 mg/dL — ABNORMAL HIGH (ref 9.0–28.0)
CO2: 21 mEq/L — ABNORMAL LOW (ref 22–29)
Calcium: 9.2 mg/dL (ref 7.9–10.2)
Chloride: 103 mEq/L (ref 100–111)
Creatinine: 2.8 mg/dL — ABNORMAL HIGH (ref 0.7–1.3)
Glucose: 169 mg/dL — ABNORMAL HIGH (ref 70–100)
Potassium: 4.3 mEq/L (ref 3.5–5.1)
Sodium: 138 mEq/L (ref 136–145)

## 2015-05-28 LAB — CBC
Hematocrit: 29.2 % — ABNORMAL LOW (ref 42.0–52.0)
Hgb: 9.6 g/dL — ABNORMAL LOW (ref 13.0–17.0)
MCH: 30.1 pg (ref 28.0–32.0)
MCHC: 32.9 g/dL (ref 32.0–36.0)
MCV: 91.5 fL (ref 80.0–100.0)
MPV: 10 fL (ref 9.4–12.3)
Platelets: 275 10*3/uL (ref 140–400)
RBC: 3.19 10*6/uL — ABNORMAL LOW (ref 4.70–6.00)
RDW: 13 % (ref 12–15)
WBC: 6.59 10*3/uL (ref 3.50–10.80)

## 2015-05-28 LAB — ECG 12-LEAD
Atrial Rate: 22 {beats}/min
Q-T Interval: 502 ms
QRS Duration: 118 ms
QTC Calculation (Bezet): 453 ms
R Axis: -50 degrees
T Axis: 36 degrees
Ventricular Rate: 49 {beats}/min

## 2015-05-28 LAB — FERRITIN: Ferritin: 221.23 ng/mL (ref 21.80–274.70)

## 2015-05-28 LAB — HEMOLYSIS INDEX: Hemolysis Index: 2 (ref 0–18)

## 2015-05-28 LAB — HEMOGLOBIN A1C
Average Estimated Glucose: 228.8 mg/dL
Hemoglobin A1C: 9.6 % — ABNORMAL HIGH (ref 4.6–5.9)

## 2015-05-28 LAB — GFR: EGFR: 21.6

## 2015-05-28 LAB — URIC ACID: Uric acid: 15.1 mg/dL — ABNORMAL HIGH (ref 3.6–9.7)

## 2015-05-28 MED ORDER — ALBUTEROL-IPRATROPIUM 2.5-0.5 (3) MG/3ML IN SOLN
3.0000 mL | Freq: Four times a day (QID) | RESPIRATORY_TRACT | Status: DC | PRN
Start: 2015-05-28 — End: 2015-06-03

## 2015-05-28 MED ORDER — SITAGLIPTIN PHOSPHATE 50 MG PO TABS
25.0000 mg | ORAL_TABLET | Freq: Every day | ORAL | Status: DC
Start: 2015-05-28 — End: 2015-06-03
  Administered 2015-05-28 – 2015-06-03 (×7): 25 mg via ORAL
  Filled 2015-05-28 (×7): qty 1

## 2015-05-28 MED ORDER — INSULIN GLARGINE 100 UNIT/ML SC SOLN
10.0000 [IU] | Freq: Every evening | SUBCUTANEOUS | Status: DC
Start: 2015-05-28 — End: 2015-06-03
  Administered 2015-05-28 – 2015-06-02 (×6): 10 [IU] via SUBCUTANEOUS
  Filled 2015-05-28 (×6): qty 10

## 2015-05-28 NOTE — Progress Notes (Addendum)
Second attempt to obtain choice of home health agency.  Marco Bruce at bedside, given list of home health agencies and [facility] name and number. Family to call with choice. Will continue to follow.       Update 06/03/15: per CM Samina daughter declines home health services.

## 2015-05-28 NOTE — Plan of Care (Signed)
Problem: Safety  Goal: Patient will be free from injury during hospitalization  Outcome: Progressing  Pt remains a moderate fall risk. Call bell and all personal items kept within reach at all times. Bed kept in lowest position and locked. Frequent rounding done and bed alarm activated. Will monitor.    Problem: Inadequate Gas Exchange  Goal: Adequately oxygenating and ventilation is improved  Outcome: Progressing  Pt remains on O2 4LNCn with O2 sat >92%. Lungs with inspiratory and expiratory wheezing noted bilaterally. SOB noted with exertion, relieved with rest. Resp treatments ordered prn for pt. Pt receiving Bumex IV BID per order. Accurate I/O's and daily weights done. Pt assisted to position for maximum oxygenation. Will monitor.

## 2015-05-28 NOTE — Consults (Addendum)
[facility]  Consultation    Date Time: 05/28/2015 1:56 PM  Patient Name: Marco Bruce  Medical Record Number: [id number]   Primary Care Physician: @[email or web address] @   Requesting Physician: Melbourne Abts, MD    Reason for Consultation:   AKI  Assessment:   -AKI  -CKD unknown stage  -Mild acidosis  -Pulmonary edema  -Proteinuria  -Anemia  -Cough:likely bronchitis  -Hypertension  -DM II  Plan:   -Agree with diuresis,Bumex 1 mg BID  -Strict I/O,low sodium diet  -Eventual ischemia eval  -Echo to assess LV function  -Urine studies,Iron profile and probable ESA as needed  D/W family    We will follow this patient closely with you. Thank you for allowing [place] to participate in the care of this patient.    Flossie Buffy, MD  Office - [phone]      History:   Marco Bruce is a 82 y.o. male who presents to the hospital on 05/26/2015 with hx of DM II,known hx of CKD but unknown stage visiting [place] from [facility] presetingw with worsening cough and dyspnea for past week.He speaks [place] and grandson translates.He feels a little better today.No CP.No urinary compalints.    Past Medical and Surgical  History:     Past Medical History   Diagnosis Date   . Diabetes mellitus    . Hypertension      Past Surgical History   Procedure Laterality Date   . Abdominal surgery       Family History:   History reviewed. No pertinent family history.  Social History:     Social History     Social History   . Marital Status: Married     Spouse Name: N/A   . Number of Children: N/A   . Years of Education: N/A     Occupational History   . Not on file.     Social History Main Topics   . Smoking status: Former Research scientist (life sciences)   . Smokeless tobacco: Not on file   . Alcohol Use: No   . Drug Use: No   . Sexual Activity: Not on file     Other Topics Concern   . Not on file     Social History Narrative   . No narrative on file     Allergies:   No Known Allergies  Medications:     Current Facility-Administered Medications   Medication Dose Route  Frequency   . amLODIPine  10 mg Oral Daily   . aspirin EC  81 mg Oral Daily   . atorvastatin  40 mg Oral QPM   . bumetanide  1 mg Intravenous BID   . doxazosin  4 mg Oral QHS   . heparin (porcine)  5,000 Units Subcutaneous Q8H [place]   . hydrALAZINE  100 mg Oral TID   . isosorbide mononitrate  120 mg Oral Daily   . pantoprazole  40 mg Oral BID AC     Review of Systems:   12 systems were reviewed and were negative except for the following:  Physical Exam:   Temp:  [97.8 F (36.6 C)-98.7 F (37.1 C)] 98.1 F (36.7 C)  Heart Rate:  [42-71] 52  Resp Rate:  [20-26] 20  BP: (143-167)/(54-72) 143/58 mmHg    Intake and Output Summary (Last 24 hours) at Date Time    Intake/Output Summary (Last 24 hours) at 05/28/15 1356  Last data filed at 05/28/15 0207   Gross per 24 hour   Intake  0 ml   Output    625 ml   Net   -625 ml       Gen: WN WD NAD  HEENT: NC/AT, moist MM, clear oropharynx  Neck: No JVD  CV: S1 S2 N RRR no M/R/GC  Chest: diffuse wheeze,rhonchi and crackles  Ab: ND NT Soft no HSM +BS  Skin: Dry  Ext: 1+edema  Psych: Appropriate mood and affect    Labs:     Recent Labs  Lab 05/28/15  0638 05/27/15  0608 05/26/15  1324   GLUCOSE 169* 151* 150*   BUN 46.5* 42.5* 42.6*   CREATININE 2.8* 2.4* 2.7*   CALCIUM 9.2 8.7 8.6   SODIUM 138 138 137   POTASSIUM 4.3 4.1 3.9   CHLORIDE 103 105 103   CO2 21* 25 26   ALBUMIN  --   --  2.9*   MAGNESIUM  --  1.7  --        Recent Labs  Lab 05/28/15  0638 05/27/15  0608 05/26/15  1324   WBC 6.59 5.71 6.43   RBC 3.19* 2.97* 3.14*   HGB 9.6* 8.8* 9.5*   HEMATOCRIT 29.2* 27.2* 28.6*   MCV 91.5 91.6 91.1   MCH 30.1 29.6 30.3   MCHC 32.9 32.4 33.2   RDW 13 13 13    MPV 10.0 9.9 9.8   PLATELETS 275 284 303     Radiology:   Radiological Procedure reviewed.   EKG:     Prior Records:   I reviewed his old records.

## 2015-05-28 NOTE — Plan of Care (Signed)
Problem: Safety  Goal: Patient will be free from injury during hospitalization  Sr's up x2. BA on. Call bell within reach. Pt up adlib with steady gait. Hourly rounding for safety.     Problem: Pain  Goal: Patient's pain/discomfort is manageable  Pt denies pain. Continuing to monitor pt for s/s of pain and discomfort.

## 2015-05-28 NOTE — Progress Notes (Signed)
[facility]  Progress Note    Patient Info:   Date/Time: 05/28/2015 / 2:25 PM   Patient Name:Marco [name]   [id number]    PCP: Harlow Ohms, MD   Admit Date:05/26/2015   Attending Physician:Kerry Chisolm, Derrek Gu, MD      Assessment and Plan:   1. Pulmonary edema-patient has severe pulmonary hypertension on his echocardiogram.  2-D echo, ejection fraction 75 percent, severe mitral and tricuspid regurgitation.  Severe left atrial enlargement  Severe pulmonary hypertension   Appreciate cardiology evaluation. Will continue IV diuresis with Bumex twice daily.  Patient is an 81 mg aspirin.  Patient's pro-calcitonin is negative. We will hold off on antibiotics for now. Patient has not spiked a fever, nor has an elevated white blood cell count.  Continue hydralazine , imdur     With chest x-ray showing pulmonary edema.  No focal consolidation pro-calcitonin negative, no leukocytosis- unlikely to be pneumonia    2. Acute Kidney injury with unknown Chronic kidney disease-patient's family notes that the patient had kidney issues in [place].   Discussed with daughter on the phone.  She is aware  about Ckd but does not remember the labs Will order a renal ultrasound.  Then going to give PCP and hospital information from [facility] .  Obtained prior RECORDS    3. Diabetes mellitus-we will continue sliding scale insulin.   A1c 9.6.  Restart Januvia.    4. Elevated troponin-patient was evaluated by cardiology. Echocardiogram was checked. Continue diuresis. May need outpatient stress test.   Continue present meds    5. Anemia-patient is not showing any signs of bleeding.  Could be chronic from CKD    6. Hypertension- Controlled patient on hydralazine and amlodipine. Patient is on isosorbide mononitrate. We will keep his medication regimen the same at this time.    7. Right upper shoulder swelling-Venous  duplex of the right upper extremity negative    8.  Scattered wheezing, place him on when  necessary nebulizers, no signs of acute bronchospasm   Continue to monitor     Condition guarded   Nephrology consult appreciated   Reviewed cardiology note   Reviewed labs, imaging, vitals   Discussed with daughter on the phone   We will get information from [place]   As per discharge instructions from the daughter, which she brought this evening.  Patient was supposed to be on Lantus, which we will continue   Patient was on  metformin  And Benazepril  which were discontinued in [place] to monitor closely, creatinine, urine output   Follow up on the renal ultrasound       DVT Prohylaxis:heaprin   Code Status: Full Code   Disposition: home vs snif   Type of Admission:Inpatient   Estimated Length of Stay (including stay in the ER receiving treatment): >48 hrs   Medical Necessity for stay:as above     Subjective:   Chief Complaint:  Continues to have shortness of breath, unable to take deep breaths abdominal tightness, wheezing, congestion  Chief Complaint   Patient presents with   . Shortness of Breath      Update of Review of Systems:Review of Systems   Constitutional: Negative for fever and chills.   HENT: Positive for congestion.    Eyes: Negative for blurred vision and double vision.   Respiratory: Positive for cough, shortness of breath and wheezing.    Cardiovascular: Negative for chest pain and palpitations.   Gastrointestinal: Negative for heartburn,  nausea, vomiting and diarrhea.        Abdominal tightness   Genitourinary: Negative for dysuria, urgency, frequency and hematuria.   Musculoskeletal: Negative for myalgias.   Neurological: Negative for dizziness, sensory change, speech change and focal weakness.   Psychiatric/Behavioral: Negative for depression.       Allergies:No Known Allergies   Medications:Meds have been reviewed Current Facility-Administered Medications   Medication Dose Route Frequency Last Rate Last Dose   . acetaminophen (TYLENOL) tablet 650 mg  650 mg Oral Q4H  PRN       . albuterol-ipratropium (DUO-NEB) 2.5-0.5(3) mg/3 mL nebulizer 3 mL  3 mL Nebulization Q6H PRN       . amLODIPine (NORVASC) tablet 10 mg  10 mg Oral Daily   10 mg at 05/28/15 1045   . aspirin EC tablet 81 mg  81 mg Oral Daily   81 mg at 05/28/15 1045   . atorvastatin (LIPITOR) tablet 40 mg  40 mg Oral QPM   40 mg at 05/27/15 1758   . bumetanide (BUMEX) injection 1 mg  1 mg Intravenous BID   1 mg at 05/28/15 0846   . dextrose (GLUCOSE) 40 % oral gel 15 g of glucose  15 g of glucose Oral PRN        And   . dextrose 50 % bolus 25 mL  25 mL Intravenous PRN        And   . glucagon (rDNA) (GLUCAGEN) injection 1 mg  1 mg Intramuscular PRN       . doxazosin (CARDURA) tablet 4 mg  4 mg Oral QHS   4 mg at 05/27/15 2242   . guaiFENesin (ROBITUSSIN) oral solution 200 mg  200 mg Oral Q4H PRN   200 mg at 05/28/15 1235   . heparin (porcine) injection 5,000 Units  5,000 Units Subcutaneous Q8H [place]   5,000 Units at 05/28/15 0559   . hydrALAZINE (APRESOLINE) tablet 100 mg  100 mg Oral TID   100 mg at 05/28/15 0603   . insulin lispro (HumaLOG) injection 1-3 Units  1-3 Units Subcutaneous QHS PRN   1 Units at 05/27/15 2241   . insulin lispro (HumaLOG) injection 1-5 Units  1-5 Units Subcutaneous TID AC PRN   1 Units at 05/28/15 1236   . isosorbide mononitrate (IMDUR) 24 hr tablet 120 mg  120 mg Oral Daily   120 mg at 05/28/15 1045   . ondansetron (ZOFRAN) injection 4 mg  4 mg Intravenous Once PRN       . pantoprazole (PROTONIX) EC tablet 40 mg  40 mg Oral BID AC   40 mg at 05/28/15 0847        Objective:   Vitals: Vitals reviewed height is 1.575 m (5\' 2" ) and weight is 73.029 kg (161 lb). His temporal artery temperature is 98.1 F (36.7 C). His blood pressure is 143/58 and his pulse is 52. His respiration is 20 and oxygen saturation is 98%. Body mass index is 29.44 kg/(m^2).  Filed Vitals:    05/28/15 0154 05/28/15 0623 05/28/15 0952 05/28/15 1130   BP: 167/69 147/65 143/58    Pulse: 42 71 55 52   Temp: 97.8 F (36.6 C) 97.8  F (36.6 C) 98.1 F (36.7 C)    TempSrc: Temporal Artery Temporal Artery Temporal Artery    Resp: 21 21 20 20    Height:       Weight:  73.029 kg (161 lb)     SpO2: 95% 99% 96%  98%   Intake and Output Summary (Last 24 hours) at Date Time   Intake/Output Summary (Last 24 hours) at 05/28/15 1425  Last data filed at 05/28/15 0207   Gross per 24 hour   Intake      0 ml   Output    625 ml   Net   -625 ml      Physical Exam: Physical Exam   Constitutional: No distress.   Ill appearing   HENT:   Head: Normocephalic and atraumatic.   Mouth/Throat: No oropharyngeal exudate.   Eyes: EOM are normal. Pupils are equal, round, and reactive to light. No scleral icterus.   Neck: Normal range of motion. JVD present. No thyromegaly present.   Cardiovascular: Normal rate and regular rhythm.    No murmur heard.  Pulmonary/Chest: Effort normal. He has wheezes. He has rales.   Not using any accessory muscles of respiration  Bilateral scattered wheezing  Bilateral rales   Abdominal: Soft. Bowel sounds are normal. He exhibits no distension. There is no tenderness. There is no rebound and no guarding.   Musculoskeletal: Normal range of motion. He exhibits edema. He exhibits no tenderness.   1+ edema   Neurological: He is alert. No cranial nerve deficit. GCS score is 15.   Non english speaking   Skin: Skin is warm.   Psychiatric: Affect normal.          Results of Labs/imaging   Labs have been reviewed:   Coagulation Profile:        CBC review:   Recent Labs  Lab 05/28/15  0638 05/27/15  0608 05/26/15  1324   WBC 6.59 5.71 6.43   HGB 9.6* 8.8* 9.5*   HEMATOCRIT 29.2* 27.2* 28.6*   PLATELETS 275 284 303   MCV 91.5 91.6 91.1   RDW 13 13 13    NEUTROPHILS  --   --  61   LYMPHOCYTES AUTOMATED  --   --  20   EOSINOPHILS AUTOMATED  --   --  9   IMMATURE GRANULOCYTE  --   --  0   NEUTROPHILS ABSOLUTE  --   --  3.94   ABSOLUTE IMMATURE GRANULOCYTE  --   --  0.01      Chem Review:  Recent Labs  Lab 05/28/15  0638 05/27/15  0608 05/26/15  1324    SODIUM 138 138 137   POTASSIUM 4.3 4.1 3.9   CHLORIDE 103 105 103   CO2 21* 25 26   BUN 46.5* 42.5* 42.6*   CREATININE 2.8* 2.4* 2.7*   GLUCOSE 169* 151* 150*   CALCIUM 9.2 8.7 8.6   MAGNESIUM  --  1.7  --    BILIRUBIN, TOTAL  --   --  0.4   AST (SGOT)  --   --  33   ALT  --   --  26   ALKALINE PHOSPHATASE  --   --  110*      Results     Procedure Component Value Units Date/Time    Uric acid [id number] Collected:  05/28/15 0633    Specimen Information:  Blood Updated:  05/28/15 1411    Glucose Whole Blood - POCT [id number]  (Abnormal) Collected:  05/28/15 1133     POCT - Glucose Whole blood 189 (H) mg/dL Updated:  05/28/15 1146    Ferritin [id number] Collected:  05/28/15 [id number]    Specimen Information:  Blood Updated:  05/28/15 1023     Ferritin 221.23 ng/mL  Hemoglobin A1C [id number]  (Abnormal) Collected:  05/28/15 0638    Specimen Information:  Blood Updated:  05/28/15 1011     Hemoglobin A1C 9.6 (H) %      Average Estimated Glucose 228.8 mg/dL     IRON PROFILE [id number]  (Abnormal) Collected:  05/28/15 0638     Iron 31 (L) ug/dL Updated:  05/28/15 1003     UIBC 168 ug/dL      TIBC 199 (L) ug/dL      Iron Saturation 16 %     Hemolysis index [id number] Collected:  05/28/15 0638     Hemolysis Index 2 Updated:  05/28/15 1003    Glucose Whole Blood - POCT [id number]  (Abnormal) Collected:  05/28/15 0746     POCT - Glucose Whole blood 171 (H) mg/dL Updated:  05/28/15 [id number]    Basic Metabolic Panel [id number]  (Abnormal) Collected:  05/28/15 0638    Specimen Information:  Blood Updated:  05/28/15 0734     Glucose 169 (H) mg/dL      BUN 46.5 (H) mg/dL      Creatinine 2.8 (H) mg/dL      Calcium 9.2 mg/dL      Sodium 138 mEq/L      Potassium 4.3 mEq/L      Chloride 103 mEq/L      CO2 21 (L) mEq/L      Anion Gap 14.0     GFR [id number] Collected:  05/28/15 0638     EGFR 21.6 Updated:  05/28/15 0734    CBC [id number]  (Abnormal) Collected:  05/28/15 0638    Specimen Information:  Blood from Blood Updated:   05/28/15 0712     WBC 6.59 x10 3/uL      Hgb 9.6 (L) g/dL      Hematocrit 29.2 (L) %      Platelets 275 x10 3/uL      RBC 3.19 (L) x10 6/uL      MCV 91.5 fL      MCH 30.1 pg      MCHC 32.9 g/dL      RDW 13 %      MPV 10.0 fL     Glucose Whole Blood - POCT [id number]  (Abnormal) Collected:  05/27/15 2206     POCT - Glucose Whole blood 167 (H) mg/dL Updated:  05/27/15 2244    Blood Culture Aerobic/Anaerobic #2 [id number] Collected:  05/26/15 1323    Specimen Information:  Arm from Blood Updated:  05/27/15 1721    Narrative:      ORDER#: [id number]                                    ORDERED BY: POTLURI, JAGADI  SOURCE: Blood lt hand                                COLLECTED:  05/26/15 13:23  ANTIBIOTICS AT COLL.:                                RECEIVED :  05/26/15 17:17  Culture Blood Aerobic and Anaerobic        PRELIM      05/27/15 17:21  05/27/15   No Growth after 1 day/s of incubation.      Blood Culture Aerobic/Anaerobic #1 [id number]  Collected:  05/26/15 1323    Specimen Information:  Arm from Blood Updated:  05/27/15 1721    Narrative:      ORDER#: [id number]                                    ORDERED BY: POTLURI, JAGADI  SOURCE: Blood lt ac                                  COLLECTED:  05/26/15 13:23  ANTIBIOTICS AT COLL.:                                RECEIVED :  05/26/15 17:18  Culture Blood Aerobic and Anaerobic        PRELIM      05/27/15 17:21  05/27/15   No Growth after 1 day/s of incubation.      Glucose Whole Blood - POCT [id number]  (Abnormal) Collected:  05/27/15 1650     POCT - Glucose Whole blood 158 (H) mg/dL Updated:  05/27/15 1659         Radiology reports have been reviewed:  Radiology Results (24 Hour)     Procedure Component Value Units Date/Time    US Renal Kidney [id number] Resulted:  05/28/15 1414    Order Status:  Sent Updated:  05/28/15 1414    US Venous Duplex Doppler Arm Right [id number] Collected:  05/27/15 2108    Order Status:  Completed Updated:  05/27/15 2113    Narrative:       HISTORY: Rule out DVT. Right arm swelling.    COMPARISON: None    FINDINGS: Gray scale, color and spectral Doppler evaluation of the right  upper extremity was performed. The internal jugular, innominate,  subclavian, axillary, brachial, basilic and cephalic veins are patent,  demonstrating normal compressibility where feasible, normal phasicity  and pulsatility.      Impression:       No evidence for deep venous thrombosis within the right  upper extremity     Ardelia Mems, MD   05/27/2015 9:08 PM                  Hospitalist   Signed by: Melbourne Abts   05/28/2015 2:25 PM

## 2015-05-28 NOTE — Plan of Care (Signed)
Pt c/o "feeling cold". Pt afebrile noted. Closely monitoring.

## 2015-05-28 NOTE — OT Progress Note (Signed)
Occupational Therapy Note    [facility]  [place], [place]  [id number]    Occupational Therapy Treatment Note       Patient:  Taveon Steinwand MRN#:  [id number]  [place] CARE UNIT [id number]/[id number]    Time of treatment: Start Time: 1000 Stop Time: 1033   Time Calculation (min): 33 min         Precautions and Contraindications:    Precautions  Other Precautions: 3 L O2    Assessment: Pt is a cooperative gentleman who presents with intact cognitive status as well as Mod Ind gross motor skills and ind performance of daily occupations.  Pt does not require OT services at this time.    Assessment  Assessment: Appears to be at baseline for ADL's  Prognosis: Good    Patient Goal  Patient Goal: "I want to go home."      Plan: Pt and family provided with education regarding temperature in the bathroom and community mobility in order to prevent SOB.  Pt and son expressed understanding of education provided.  Pt does not require OT services at this time.      OT Plan  Risks/Benefits/POC Discussed with Pt/Family: With patient/family  Treatment Interventions: No skilled interventions needed at this time  Discharge Recommendation: Home with no needs  DME Recommended for Discharge:  (pt has all required DME)  OT Frequency Recommended: one time visit                                    Based on today's session patient's discharge recommendation is the following: Discharge Recommendation: Home with no needs.     If Discharge Recommendation: Home with no needs.  DME Recommended for Discharge:  (pt has all required DME)            Subjective:   .    Patient's medical condition is appropriate for Occupational Therapy intervention at this time.  Patient is agreeable to participation in the therapy session.       Objective:  Observation of Patient/Vital Signs:  Patient is in bed with O2 at 3   liters/minute via nasal cannula in place.    Neuro  Status  Behavior: cooperative  Motor Planning: intact    Functional Mobility  Functional Mobility/Ambulation: Modified Independent    Self-care and Home Management  Eating: Independent  Grooming: Independent  Bathing: Independent  UB Dressing: Independent  LB Dressing: Independent  Toileting: Independent  Functional Transfers: Modified Independent  Item Retrieval: Independent                                  Treatment Activities: Pt seen this date for initial OT evaluation including gross motor skills, toileting, and LB dressing.  Pt demonstrated IND to MOD IND with all requested tasks.      Educated the patient to role of occupational therapy, plan of care, goals of therapy and HEP, safety with mobility and ADLs, pursed lip breathing, home safety.    Patient left without needs and call bell within reach. Chari Alarm set.  RN notified of session outcome.

## 2015-05-28 NOTE — Progress Notes (Signed)
[place]    Date Time: 05/28/2015 11:49 AM  Patient Name: Marco Bruce       Patient Active Problem List   Diagnosis   . SOB (shortness of breath)   . ARF (acute renal failure)   . Pulmonary edema   . Acute pulmonary edema       Assessment:      Acute diastolic HF with volume overload.   Dyspnea.   HTN.   DM.   Anemia.   CKD IV.   Mild nonspecific TnI elevation (0.10) in setting of diastolic HF.   HLD.   Former Tobacco use. ? COPD.   Echo 05-27-15: Severe MR, severe TR, severe pulm HTN, normal LV systolic function, mild AS, mild-mod AI.    Recommendations:    Continue IV Bumex diuresis. If inadequate response will need to increase dose.   Recommend Nephrology consultation.   Continue Imdur/Hydralazine in lieu of ACEI/ARB.   Continue ASA, Statin.   Recommend Neb breathing tx.   Would advocate repeating Echo ultimately once euvolemic to reassess valve disease when euvolemic.   Consider RHC depending on clinical course.   Consider ischemic evaluation in future (MPI due to severe renal dysftn).   Will follow.      Medications:      Scheduled Meds: PRN Meds:      amLODIPine 10 mg Oral Daily   aspirin EC 81 mg Oral Daily   atorvastatin 40 mg Oral QPM   bumetanide 1 mg Intravenous BID   doxazosin 4 mg Oral QHS   heparin (porcine) 5,000 Units Subcutaneous Q8H [place]   hydrALAZINE 100 mg Oral TID   isosorbide mononitrate 120 mg Oral Daily   pantoprazole 40 mg Oral BID AC       Continuous Infusions:      acetaminophen 650 mg Q4H PRN   dextrose 15 g of glucose PRN   And     dextrose 25 mL PRN   And     glucagon (rDNA) 1 mg PRN   guaiFENesin 200 mg Q4H PRN   insulin lispro 1-3 Units QHS PRN   insulin lispro 1-5 Units TID AC PRN   ondansetron 4 mg Once PRN             Subjective:   Denies chest pain, SOB or palpitations.      Physical Exam:     Filed Vitals:    05/28/15 0952   BP: 143/58   Pulse: 55   Temp: 98.1 F (36.7 C)   Resp: 20   SpO2: 96%     Temp (24hrs),  Avg:98.2 F (36.8 C), Min:97.8 F (36.6 C), Max:98.7 F (37.1 C)      Telemetry reviewed no changes.     Intake and Output Summary (Last 24 hours) at Date Time    Intake/Output Summary (Last 24 hours) at 05/28/15 1149  Last data filed at 05/28/15 0207   Gross per 24 hour   Intake      0 ml   Output    625 ml   Net   -625 ml       General Appearance:  Breathing comfortable, no acute distress  Head:  normocephalic  Eyes:  EOM's intact  Neck:  JVD  Lungs:  Diffuse crackles/wheeze  Chest Wall:  No tenderness or deformity  Heart:  Regular, 2/6 systolic murmur   Abdomen:  Soft, non-tender, positive bowel sounds, no hepatojugular reflux  Extremities:  Edema  Pulses:  Equal radial pulses, 4/4 symmetric  Neurologic:  Alert and oriented x3, mood and affect normal  Musculoskeletal: normal strength and tone    Labs:     Recent Labs  Lab 05/27/15  0031 05/26/15  1913 05/26/15  1323   TROPONIN I 0.08 0.08 0.10*               Recent Labs  Lab 05/26/15  1324   BILIRUBIN, TOTAL 0.4   PROTEIN, TOTAL 6.3   ALBUMIN 2.9*   ALT 26   AST (SGOT) 33       Recent Labs  Lab 05/27/15  0608   MAGNESIUM 1.7           Recent Labs  Lab 05/28/15  0638 05/27/15  0608 05/26/15  1324   WBC 6.59 5.71 6.43   HGB 9.6* 8.8* 9.5*   HEMATOCRIT 29.2* 27.2* 28.6*   PLATELETS 275 284 303       Recent Labs  Lab 05/28/15  0638 05/27/15  0608 05/26/15  1324   SODIUM 138 138 137   POTASSIUM 4.3 4.1 3.9   CHLORIDE 103 105 103   CO2 21* 25 26   BUN 46.5* 42.5* 42.6*   CREATININE 2.8* 2.4* 2.7*   EGFR 21.6 25.7 22.5   GLUCOSE 169* 151* 150*   CALCIUM 9.2 8.7 8.6           Invalid input(s): FREET4    .  Lab Results   Component Value Date    BNP 643.4* 05/26/2015      Estimated Creatinine Clearance: 16.6 mL/min (based on Cr of 2.8).    Weight Monitoring 05/26/2015 05/26/2015 05/27/2015 05/28/2015   Height 157.5 cm 157.5 cm - -   Height Method Stated - - -   Weight 72.576 kg 77.384 kg 77.565 kg 73.029 kg   Weight Method Stated Bed Scale - Standing Scale   BMI  (calculated) 29.3 kg/m2 31.3 kg/m2 - -         Imaging:   Radiological Procedure reviewed.              Signed by: Wandra Arthurs, MD        [facility]  NP [place] (8am-5pm)  MD Spectralink [phone] (8am-5pm)  After hours, non urgent consult line [phone]  After Hours, urgent consults [phone]

## 2015-05-28 NOTE — Plan of Care (Signed)
Pt's daughter brought in pt's discharge papers from [facility]. Secretary notified them to fax medical record.

## 2015-05-28 NOTE — PT Eval Note (Signed)
[facility]  Marco Bruce    Department of Rehabilitation  [phone]    Physical Therapy Evaluation    Patient: Marco Bruce    MRN#: [id number]     [id number]/[id number]    Time of treatment: Time Calculation  PT Received On: 05/28/15  Start Time: 1400  Stop Time: [id number]  Time Calculation (min): 20 min    PT Visit Number: 1    Consult received for Marco Bruce for PT Evaluation and Treatment.  Patient's medical condition is appropriate for Physical therapy intervention at this time.      Assessment:   Marco Bruce is a 82 y.o. male admitted 05/26/2015 with SOB and cough presenting with Decreased LE strength;Decreased endurance/activity tolerance;Decreased functional mobility;Decreased balance;Gait impairment.     Therapy Diagnosis: generalized weakness, decreased functional mobility , decreased independence with ADL's, increased gait dysfunction and decreased endurance/ activity engagement due to current hospitalization. Without therapy interventions, patient is at risk for falls, dependence on caregivers for mobility and dependence on caregivers for ADL's.    Rehabilitation Potential: Prognosis: Good;With continued PT status post acute discharge      Plan:    Treatment/Interventions: Exercise;Gait training;Stair training;Neuromuscular re-education;Functional transfer training;LE strengthening/ROM;Patient/family training PT Frequency: 2-3x/wk    Risks/Benefits/POC Discussed with Pt/Family: With patient/family          Goals:   Goals  Goal Formulation: With patient/family  Time for Goal Acheivement: 3 visits  Pt Will Perform Sit to Stand: independent;to maximize functional mobility and independence  Pt Will Transfer Bed/Chair: modified independent;to maximize functional mobility and independence  Pt Will Ambulate: > 200 feet;with single point cane;with stand by assist;to maximize functional mobility and independence  Pt Will Go Up / Down Stairs: 3-5 stairs;with contact guard assist;to maximize  functional mobility and independence      Discharge Recommendations:   Based on today's session patient's discharge recommendation is the following: Discharge Recommendation: Home with supervision     DME Recommended for Discharge: Front wheel walker      Precautions and Contraindications:   Precautions  Other Precautions: Falls     Medical Diagnosis: Hypoxia [R09.02]  Elevated troponin [R79.89]  Acute congestive heart failure, unspecified congestive heart failure type [I50.9]  Pneumonia due to infectious organism, unspecified laterality, unspecified part of lung [J18.9]  SOB (shortness of breath) [R06.02]    History of Present Illness: Marco Bruce is a 82 y.o. male admitted on 05/26/2015 with SOB and cough.     Patient Active Problem List   Diagnosis   . SOB (shortness of breath)   . ARF (acute renal failure)   . Pulmonary edema   . Acute pulmonary edema        Past Medical/Surgical History:  Past Medical History   Diagnosis Date   . Diabetes mellitus    . Hypertension       Past Surgical History   Procedure Laterality Date   . Abdominal surgery           X-Rays/Tests/Labs:  CXR 2/20  Cardiomegaly with mild pulmonary edema.     Echo 2/21    1. Normal left ventricular size and systolic function, LVEF [id number].  2. Impaired left ventricular relaxation.  3. Severe left atrial enlargement.  4. Severe mitral and tricuspid regurgitation.  5. Mild to moderate aortic regurgitation. Mild aortic stenosis.  6. Severe pulmonary hypertension, estimated PASP 66mmHg.    Social History:  Prior Level of Function  Prior level of function: Independent with  ADLs, Ambulates with assistive device  Assistive Device: Single point cane  Baseline Activity Level: Community ambulation  Employment: Retired  DME Currently at [employer]:  (grab bars in shower and shower chair, grab bars next to toil)  [place]: Family members  Type of Home: [place]: One level  Bathroom Shower/Tub: Walk-in Financial trader: [place]: Grab bars in shower  DME Currently at Home:  (grab bars in shower and shower chair, grab bars next to toil)      Subjective:    Patient is agreeable to participation in the therapy session. Nursing clears patient for therapy.   Patient Goal  Patient Goal: Get better   Pain Assessment  Pain Assessment: No/denies pain    Objective:   Observation of Patient/Vital Signs:  Patient is in bed with telemetry, PIV, 3L O2 via NC in place. Grandson present to assist with translating session per patient request.          Cognition/Neuro Status  Arousal/Alertness: Appropriate responses to stimuli  Attention Span: Appears intact  Orientation Level: Oriented X4  Memory: Appears intact  Following Commands: Follows all commands and directions without difficulty  Safety Awareness: minimal verbal instruction  Insights: Fully aware of deficits  Problem Solving: minimal assistance      Hearing: WNL  Vision: wears glasses  Sensation: WNL    Gross ROM  Right Lower Extremity ROM: within functional limits  Left Lower Extremity ROM: within functional limits  Gross Strength  Right Lower Extremity Strength: within functional limits  Left Lower Extremity Strength: within functional limits  Tone  Tone: within functional limits    Functional Mobility  Rolling: Independent  Supine to Sit: Independent  Sit to Stand: Stand by Assist  Stand to Sit: Stand by Assist     Locomotion  Ambulation: Contact Guard Assist;with front-wheeled walker  Ambulation Distance (Feet): 80 Feet  Pattern: decreased cadence;decreased step length (Flexed trunk, maintained BOS outside of base of RW)     Balance  Sitting - Static: Good  Sitting - Dynamic: Good  Standing - Static: Good  Standing - Dynamic: Fair    Participation and Endurance  Participation Effort: good  Endurance: Tolerates 10 - 20 min exercise with multiple rests         AM-PACT Inpatient Short Forms  Inpatient AM-PACT Performed? (PT): Basic Mobility Inpatient Short  Form  AM-PACT "6 Clicks" Basic Mobility Inpatient Short Form  Turning Over in Bed: None  Sitting Down On/Standing From Armchair: None  Lying on Back to Sitting on Side of Bed: None  Assist Moving to/from Bed to Chair: A little  Assist to Walk in Hospital Room: A little  Assist to Climb 3-5 Steps with Railing: A little  PT Basic Mobility Raw Score: 21  CMS 0-100% Score: 28.97%    Treatment Activities: Patient without c/o dizziness, lightheadedness or SOB with ambulation, SpO2 98% on 4L with ambulation.  Discussed that patient may require use of RW initially upon d/c home for safety and energy conservation. Pt educated on importance of maintaining strength and endurance while in house by ambulating with RN staff and use of RW, as well as eating all meals OOB and minimizing time in bed; pt agreeable.  Pt educated on importance of call bell, especially when getting OOB or out of chair.  Pt left sitting in transport chair with tech to go to [place] with all needs met.  Educated the patient to role of physical therapy, plan of care, goals of therapy and HEP, safety with mobility and ADLs.    Lamar Laundry, PT, DPT

## 2015-05-28 NOTE — Plan of Care (Signed)
Found pt to be sitting upright in bed. Pt c/o cough and unable to bring up mucous. Gave guaifensin. LS rhonchi, crackles and wheezes auscultated. No dyspnea at rest noted. O2 sat 99% on 4l/m noted. Family at bedside assisting pt with translation and needs. Am assessment completed.

## 2015-05-29 ENCOUNTER — Ambulatory Visit (INDEPENDENT_AMBULATORY_CARE_PROVIDER_SITE_OTHER): Payer: Medicare Other | Admitting: "Endocrinology

## 2015-05-29 LAB — GLUCOSE WHOLE BLOOD - POCT
Whole Blood Glucose POCT: 128 mg/dL — ABNORMAL HIGH (ref 70–100)
Whole Blood Glucose POCT: 209 mg/dL — ABNORMAL HIGH (ref 70–100)
Whole Blood Glucose POCT: 107 mg/dL — ABNORMAL HIGH (ref 70–100)
Whole Blood Glucose POCT: 142 mg/dL — ABNORMAL HIGH (ref 70–100)

## 2015-05-29 LAB — BASIC METABOLIC PANEL
Anion Gap: 12 (ref 5.0–15.0)
BUN: 52.7 mg/dL — ABNORMAL HIGH (ref 9.0–28.0)
CO2: 22 mEq/L (ref 22–29)
Calcium: 8.9 mg/dL (ref 7.9–10.2)
Chloride: 102 mEq/L (ref 100–111)
Creatinine: 2.9 mg/dL — ABNORMAL HIGH (ref 0.7–1.3)
Glucose: 134 mg/dL — ABNORMAL HIGH (ref 70–100)
Potassium: 4.1 mEq/L (ref 3.5–5.1)
Sodium: 136 mEq/L (ref 136–145)

## 2015-05-29 LAB — CBC
Hematocrit: 28.6 % — ABNORMAL LOW (ref 42.0–52.0)
Hgb: 9.3 g/dL — ABNORMAL LOW (ref 13.0–17.0)
MCH: 29.6 pg (ref 28.0–32.0)
MCHC: 32.5 g/dL (ref 32.0–36.0)
MCV: 91.1 fL (ref 80.0–100.0)
MPV: 9.7 fL (ref 9.4–12.3)
Platelets: 265 10*3/uL (ref 140–400)
RBC: 3.14 10*6/uL — ABNORMAL LOW (ref 4.70–6.00)
RDW: 13 % (ref 12–15)
WBC: 6.54 10*3/uL (ref 3.50–10.80)

## 2015-05-29 LAB — GFR: EGFR: 20.7

## 2015-05-29 MED ORDER — BENZONATATE 100 MG PO CAPS
100.0000 mg | ORAL_CAPSULE | Freq: Three times a day (TID) | ORAL | Status: DC | PRN
Start: 2015-05-29 — End: 2015-06-03
  Administered 2015-05-29 – 2015-06-03 (×13): 100 mg via ORAL
  Filled 2015-05-29 (×13): qty 1

## 2015-05-29 NOTE — Plan of Care (Signed)
Problem: Safety  Goal: Patient will be free from injury during hospitalization  Outcome: Progressing    Intervention: Assess patient's risk for falls and implement fall prevention plan of care per policy  Intervention: Provide and maintain safe environment  Intervention: Use appropriate transfer methods  Intervention: Ensure appropriate safety devices are available at the bedside  Intervention: Include patient/family/caregiver in decisions related to safety  Intervention: Hourly rounding.

## 2015-05-29 NOTE — Progress Notes (Addendum)
[place]    Date Time: 05/29/2015 11:15 AM  Patient Name: [facility]           Assessment:      Acute diastolic HF with volume overload.   EF 57% Echo 05-27-15: Severe MR, severe TR, severe pulm HTN, normal LV systolic function, mild AS, mild-mod AI.   Dyspnea.   HTN.   DM.   Anemia.   CKD IV.   Mild nonspecific TnI elevation (0.10) in setting of diastolic HF.   HLD.   Former Tobacco use. ? COPD.    Recommendations:    Appreciate nephrology recs, continue IV diuresis   Continue Imdur/Hydralazine in lieu of ACEI/ARB.   Continue norvasc   Continue ASA, Statin   Ischemic evaluation once more medically stable (MPI due to severe renal dysfunction)      Medications:      Scheduled Meds: PRN Meds:      amLODIPine 10 mg Oral Daily   aspirin EC 81 mg Oral Daily   atorvastatin 40 mg Oral QPM   bumetanide 1 mg Intravenous BID   doxazosin 4 mg Oral QHS   heparin (porcine) 5,000 Units Subcutaneous Q8H [place]   hydrALAZINE 100 mg Oral TID   insulin glargine 10 Units Subcutaneous QHS   isosorbide mononitrate 120 mg Oral Daily   pantoprazole 40 mg Oral BID AC   SITagliptin 25 mg Oral Daily       Continuous Infusions:      acetaminophen 650 mg Q4H PRN   albuterol-ipratropium 3 mL Q6H PRN   benzonatate 100 mg TID PRN   dextrose 15 g of glucose PRN   And     dextrose 25 mL PRN   And     glucagon (rDNA) 1 mg PRN   guaiFENesin 200 mg Q4H PRN   insulin lispro 1-3 Units QHS PRN   insulin lispro 1-5 Units TID AC PRN   ondansetron 4 mg Once PRN             Subjective:   Denies chest pain, SOB or palpitations.      Physical Exam:     Filed Vitals:    05/29/15 0953   BP: 166/74   Pulse: 51   Temp: 99.3 F (37.4 C)   Resp: 26   SpO2: 95%     Temp (24hrs), Avg:98.6 F (37 C), Min:97.4 F (36.3 C), Max:99.5 F (37.5 C)    Weight Monitoring 05/26/2015 05/26/2015 05/27/2015 05/28/2015   Height 157.5 cm 157.5 cm - -   Height Method Stated - - -   Weight 72.576 kg 77.384 kg 77.565 kg 73.029 kg   Weight  Method Stated Bed Scale - Standing Scale   BMI (calculated) 29.3 kg/m2 31.3 kg/m2 - -          Telemetry reviewed no changes.  SR  SB with 45 q hs     Intake and Output Summary (Last 24 hours) at Date Time    Intake/Output Summary (Last 24 hours) at 05/29/15 1115  Last data filed at 05/29/15 0600   Gross per 24 hour   Intake    120 ml   Output    550 ml   Net   -430 ml       General Appearance:  Breathing comfortable, no acute distress  Neck:  No carotid bruit, + jugular venous distension, brisk carotid upstroke  Lungs:  Diffuse crackles throughout bilateral lower lung fields.  good respiratory  effort   Heart:  S1, S2 normal, no S3, no S4,  PMI not displaced, no rub   Abdomen:  Soft, non-tender, positive bowel sounds, no hepatojugular reflux  Extremities:  No cyanosis, clubbing, BLE edema  Pulses:  Equal radial pulses, 4/4 symmetric  Neurologic:  Alert and oriented x3, mood and affect normal    Labs:     Recent Labs  Lab 05/27/15  0031 05/26/15  1913 05/26/15  1323   TROPONIN I 0.08 0.08 0.10*               Recent Labs  Lab 05/26/15  1324   BILIRUBIN, TOTAL 0.4   PROTEIN, TOTAL 6.3   ALBUMIN 2.9*   ALT 26   AST (SGOT) 33       Recent Labs  Lab 05/27/15  0608   MAGNESIUM 1.7           Recent Labs  Lab 05/29/15  0704 05/28/15  0638 05/27/15  0608   WBC 6.54 6.59 5.71   HGB 9.3* 9.6* 8.8*   HEMATOCRIT 28.6* 29.2* 27.2*   PLATELETS 265 275 284       Recent Labs  Lab 05/29/15  0704 05/28/15  0638 05/27/15  0608   SODIUM 136 138 138   POTASSIUM 4.1 4.3 4.1   CHLORIDE 102 103 105   CO2 22 21* 25   BUN 52.7* 46.5* 42.5*   CREATININE 2.9* 2.8* 2.4*   EGFR 20.7 21.6 25.7   GLUCOSE 134* 169* 151*   CALCIUM 8.9 9.2 8.7     Estimated Creatinine Clearance: 16 mL/min (based on Cr of 2.9).      Lab Results   Component Value Date    BNP 643.4* 05/26/2015      Imaging:         Signed by: Lorelee Cover, PA    Pt seen.  Cardiac exam-loud holosystolic murmur.  My plan as noted.    Merleen Milliner, M.D.      [facility]  NP [place]  [phone] (8am-5pm)  MD Spectralink [phone] (8am-5pm)  After hours, non urgent consult line [phone]  After Hours, urgent consults [phone]

## 2015-05-29 NOTE — Progress Notes (Addendum)
[facility]PITAL[facility]  Progress Note    Patient Info:   Date/Time: 05/29/2015 / 1:36 PM   Patient Name:Marco Bruce   VN:[id number]    PCP: Harlow Ohms, MD   Admit Date:05/26/2015   Attending Physician:Makinzee Durley, Derrek Gu, MD      Assessment and Plan:   1. Pulmonary edema-patient has severe pulmonary hypertension on his echocardiogram.  2-D echo, ejection fraction 75 percent, severe mitral and tricuspid regurgitation.  Severe left atrial enlargement  Severe pulmonary hypertension   Appreciate cardiology evaluation. Diuresing well  Will continue IV diuresis with Bumex twice daily.     Patient's pro-calcitonin is negative. We will hold off on antibiotics for now. Patient has not spiked a fever, nor has an elevated white blood cell count.  Continue hydralazine , imdur     With chest x-ray showing pulmonary edema.  No focal consolidation pro-calcitonin negative, no leukocytosis- unlikely to be pneumonia    2. Acute Kidney injury with unknown Chronic kidney disease-patient's family notes that the patient had kidney issues in [place].   Discussed with daughter on the phone.  She is aware  about Ckd but does not remember the labs  Renal ultrasound no acute findings or obstrutcion.  Awaiting  RECORDS from PCP  Daughter understands that if creatinine gets worse and patient is still symptomatic, he might need dialysis    3. Diabetes mellitus-we will continue sliding scale insulin.   A1c 9.6.  Restart Januvia and Lantus    4. Elevated troponin-patient was evaluated by cardiology.   Echocardiogram - EF 75%, reviewed  Continue diuresis. May need outpatient stress test.   Continue present meds. Patient is  on 81 mg aspirin.    5. Anemia-patient is not showing any signs of bleeding.  Could be chronic from CKD    6. Hypertension- Controlled patient on hydralazine and amlodipine. Patient is on isosorbide mononitrate. We will keep his medication regimen the same at this time.    7. Right upper  shoulder swelling-Venous  duplex of the right upper extremity negative    8.  Scattered wheezing, place him on when necessary nebulizers, no signs of acute bronchospasm   Continue to monitor     Condition guarded   Nephrology consult appreciated   Reviewed cardiology note   Reviewed labs, imaging, vitals   Discussed with daughter and other family members at bedside  We will get information from [place]         DVT Prohylaxis:heaprin   Code Status: Full Code   Disposition: home vs snif   Type of Admission:Inpatient   Estimated Length of Stay (including stay in the ER receiving treatment): >48 hrs   Medical Necessity for stay:as above     Subjective:   Chief Complaint:  Overall feeling better.  Shortness of breath has improved  Urinating fine  Denies any chest pain  Abdominal distention much better  Chief Complaint   Patient presents with   . Shortness of Breath      Update of Review of Systems:Review of Systems   Constitutional: Negative for fever and chills.   HENT: Positive for congestion.    Eyes: Negative for blurred vision and double vision.   Respiratory: Positive for cough and shortness of breath. Negative for wheezing.    Cardiovascular: Negative for chest pain and palpitations.   Gastrointestinal: Negative for heartburn, nausea, vomiting and diarrhea.   Genitourinary: Negative for dysuria, urgency, frequency and hematuria.   Musculoskeletal: Negative for myalgias.   Neurological: Negative  for dizziness, sensory change, speech change and focal weakness.   Psychiatric/Behavioral: Negative for depression.       Allergies:No Known Allergies   Medications:Meds have been reviewed   Current Facility-Administered Medications   Medication Dose Route Frequency Last Rate Last Dose   . acetaminophen (TYLENOL) tablet 650 mg  650 mg Oral Q4H PRN       . albuterol-ipratropium (DUO-NEB) 2.5-0.5(3) mg/3 mL nebulizer 3 mL  3 mL Nebulization Q6H PRN       . amLODIPine (NORVASC) tablet 10 mg  10 mg Oral Daily   10 mg at  05/29/15 1033   . aspirin EC tablet 81 mg  81 mg Oral Daily   81 mg at 05/29/15 1034   . atorvastatin (LIPITOR) tablet 40 mg  40 mg Oral QPM   40 mg at 05/28/15 1733   . benzonatate (TESSALON) capsule 100 mg  100 mg Oral TID PRN   100 mg at 05/29/15 0618   . bumetanide (BUMEX) injection 1 mg  1 mg Intravenous BID   1 mg at 05/29/15 [id number]   . dextrose (GLUCOSE) 40 % oral gel 15 g of glucose  15 g of glucose Oral PRN        And   . dextrose 50 % bolus 25 mL  25 mL Intravenous PRN        And   . glucagon (rDNA) (GLUCAGEN) injection 1 mg  1 mg Intramuscular PRN       . doxazosin (CARDURA) tablet 4 mg  4 mg Oral QHS   4 mg at 05/28/15 2209   . guaiFENesin (ROBITUSSIN) oral solution 200 mg  200 mg Oral Q4H PRN   200 mg at 05/29/15 1036   . heparin (porcine) injection 5,000 Units  5,000 Units Subcutaneous Q8H [place]   5,000 Units at 05/29/15 0619   . hydrALAZINE (APRESOLINE) tablet 100 mg  100 mg Oral TID   100 mg at 05/29/15 0618   . insulin glargine (LANTUS) injection 10 Units  10 Units Subcutaneous QHS   10 Units at 05/28/15 2232   . insulin lispro (HumaLOG) injection 1-3 Units  1-3 Units Subcutaneous QHS PRN   1 Units at 05/28/15 2232   . insulin lispro (HumaLOG) injection 1-5 Units  1-5 Units Subcutaneous TID AC PRN   2 Units at 05/29/15 1251   . isosorbide mononitrate (IMDUR) 24 hr tablet 120 mg  120 mg Oral Daily   120 mg at 05/29/15 1034   . ondansetron (ZOFRAN) injection 4 mg  4 mg Intravenous Once PRN       . pantoprazole (PROTONIX) EC tablet 40 mg  40 mg Oral BID AC   40 mg at 05/29/15 [id number]   . SITagliptin (JANUVIA) tablet 25 mg  25 mg Oral Daily   25 mg at 05/29/15 1034        Objective:   Vitals: Vitals reviewed height is 1.575 m (5\' 2" ) and weight is 73.029 kg (161 lb). His temporal artery temperature is 99.3 F (37.4 C). His blood pressure is 166/74 and his pulse is 51. His respiration is 26 and oxygen saturation is 95%. Body mass index is 29.44 kg/(m^2).  Filed Vitals:    05/28/15 2201 05/29/15 0153 05/29/15  0620 05/29/15 0953   BP: 159/73 153/67 158/70 166/74   Pulse: 50 42 51 51   Temp: 99.5 F (37.5 C) 98.6 F (37 C) 98.3 F (36.8 C) 99.3 F (37.4 C)   TempSrc: Temporal Artery Temporal  Artery Temporal Artery Temporal Artery   Resp: 23 19  26    Height:       Weight:       SpO2: 93% 92% 96% 95%   Intake and Output Summary (Last 24 hours) at Date Time     Intake/Output Summary (Last 24 hours) at 05/29/15 1336  Last data filed at 05/29/15 0600   Gross per 24 hour   Intake    120 ml   Output    550 ml   Net   -430 ml      Physical Exam: Physical Exam   Constitutional: No distress.   Ill appearing   HENT:   Head: Normocephalic and atraumatic.   Mouth/Throat: No oropharyngeal exudate.   Eyes: EOM are normal. Pupils are equal, round, and reactive to light. No scleral icterus.   Neck: Normal range of motion. JVD present. No thyromegaly present.   Cardiovascular: Normal rate and regular rhythm.    No murmur heard.  Pulmonary/Chest: Effort normal. He has no wheezes. He has rales.   Not using any accessory muscles of respiration  Bilateral scattered wheezing  Bilateral rales   Abdominal: Soft. Bowel sounds are normal. He exhibits no distension. There is no tenderness. There is no rebound and no guarding.   Musculoskeletal: Normal range of motion. He exhibits edema. He exhibits no tenderness.   1+ edema   Neurological: He is alert. No cranial nerve deficit. GCS score is 15.   Non english speaking   Skin: Skin is warm.   Psychiatric: Affect normal.          Results of Labs/imaging   Labs have been reviewed:   Coagulation Profile:        CBC review:     Recent Labs  Lab 05/29/15  0704 05/28/15  0638 05/27/15  0608 05/26/15  1324   WBC 6.54 6.59 5.71 6.43   HGB 9.3* 9.6* 8.8* 9.5*   HEMATOCRIT 28.6* 29.2* 27.2* 28.6*   PLATELETS 265 275 284 303   MCV 91.1 91.5 91.6 91.1   RDW 13 13 13 13    NEUTROPHILS  --   --   --  61   LYMPHOCYTES AUTOMATED  --   --   --  20   EOSINOPHILS AUTOMATED  --   --   --  9   IMMATURE GRANULOCYTE  --    --   --  0   NEUTROPHILS ABSOLUTE  --   --   --  3.94   ABSOLUTE IMMATURE GRANULOCYTE  --   --   --  0.01      Chem Review:    Recent Labs  Lab 05/29/15  0704 05/28/15  0638 05/27/15  0608 05/26/15  1324   SODIUM 136 138 138 137   POTASSIUM 4.1 4.3 4.1 3.9   CHLORIDE 102 103 105 103   CO2 22 21* 25 26   BUN 52.7* 46.5* 42.5* 42.6*   CREATININE 2.9* 2.8* 2.4* 2.7*   GLUCOSE 134* 169* 151* 150*   CALCIUM 8.9 9.2 8.7 8.6   MAGNESIUM  --   --  1.7  --    BILIRUBIN, TOTAL  --   --   --  0.4   AST (SGOT)  --   --   --  33   ALT  --   --   --  26   ALKALINE PHOSPHATASE  --   --   --  110*  Results     Procedure Component Value Units Date/Time    Glucose Whole Blood - POCT [id number]  (Abnormal) Collected:  05/29/15 1219     POCT - Glucose Whole blood 209 (H) mg/dL Updated:  05/29/15 [id number]    Basic Metabolic Panel [id number]  (Abnormal) Collected:  05/29/15 0704    Specimen Information:  Blood Updated:  05/29/15 0747     Glucose 134 (H) mg/dL      BUN 52.7 (H) mg/dL      Creatinine 2.9 (H) mg/dL      Calcium 8.9 mg/dL      Sodium 136 mEq/L      Potassium 4.1 mEq/L      Chloride 102 mEq/L      CO2 22 mEq/L      Anion Gap 12.0     GFR [id number] Collected:  05/29/15 0704     EGFR 20.7 Updated:  05/29/15 0747    Glucose Whole Blood - POCT [id number]  (Abnormal) Collected:  05/29/15 0735     POCT - Glucose Whole blood 128 (H) mg/dL Updated:  05/29/15 0738    CBC [id number]  (Abnormal) Collected:  05/29/15 0704    Specimen Information:  Blood from Blood Updated:  05/29/15 0731     WBC 6.54 x10 3/uL      Hgb 9.3 (L) g/dL      Hematocrit 28.6 (L) %      Platelets 265 x10 3/uL      RBC 3.14 (L) x10 6/uL      MCV 91.1 fL      MCH 29.6 pg      MCHC 32.5 g/dL      RDW 13 %      MPV 9.7 fL     Glucose Whole Blood - POCT [id number]  (Abnormal) Collected:  05/28/15 2159     POCT - Glucose Whole blood 188 (H) mg/dL Updated:  05/28/15 2331    Glucose Whole Blood - POCT [id number]  (Abnormal) Collected:  05/28/15 1658     POCT -  Glucose Whole blood 174 (H) mg/dL Updated:  05/28/15 1815    Blood Culture Aerobic/Anaerobic #1 [id number] Collected:  05/26/15 1323    Specimen Information:  Arm from Blood Updated:  05/28/15 1722    Narrative:      ORDER#: [id number]                                    ORDERED BY: POTLURI, JAGADI  SOURCE: Blood lt ac                                  COLLECTED:  05/26/15 13:23  ANTIBIOTICS AT COLL.:                                RECEIVED :  05/26/15 17:18  Culture Blood Aerobic and Anaerobic        PRELIM      05/28/15 17:21  05/27/15   No Growth after 1 day/s of incubation.  05/28/15   No Growth after 2 day/s of incubation.      Blood Culture Aerobic/Anaerobic #2 [id number] Collected:  05/26/15 1323    Specimen Information:  Arm from Blood Updated:  05/28/15 1722    Narrative:      ORDER#:  [id number]                                    ORDERED BY: POTLURI, JAGADI  SOURCE: Blood lt hand                                COLLECTED:  05/26/15 13:23  ANTIBIOTICS AT COLL.:                                RECEIVED :  05/26/15 17:17  Culture Blood Aerobic and Anaerobic        PRELIM      05/28/15 17:21  05/27/15   No Growth after 1 day/s of incubation.  05/28/15   No Growth after 2 day/s of incubation.      Uric acid [id number]  (Abnormal) Collected:  05/28/15 0633    Specimen Information:  Blood Updated:  05/28/15 1434     Uric acid 15.1 (H) mg/dL          Radiology reports have been reviewed:  Radiology Results (24 Hour)     Procedure Component Value Units Date/Time    US Renal Kidney [id number] Collected:  05/28/15 1444    Order Status:  Completed Updated:  05/28/15 1449    Narrative:      CLINICAL HISTORY: Elevated creatinine with unknown baseline    COMPARISON: None    RENAL ULTRASOUND FINDINGS:  There is increased echogenicity of the kidneys with thinning of the  renal parenchyma suggestive of chronic medical renal disease. Right  kidney measures 9.8 cm., and left kidney measures 10.5 cm.  There is a  0.5 x 0.6 x 0.6 m  right lower pole simple appearing renal cyst.  No  stone or hydronephrosis is evident.  The pre-void bladder appears  grossly normal. There is no significant post void residual.      Impression:        Findings compatible with chronic medical renal disease.    Mertha Baars, MD   05/28/2015 2:45 PM                  Hospitalist   Signed by: Melbourne Abts   05/29/2015 1:36 PM

## 2015-05-29 NOTE — Progress Notes (Signed)
Dr. Orson Slick, during rounds, told the Case Manager that patient will stay for 1-2 more days due to creatinine level monitoring in the hospital.  CM will follow up as needed.

## 2015-05-29 NOTE — Plan of Care (Signed)
Problem: Safety  Goal: Patient will be free from injury during hospitalization  Outcome: Progressing  Patient c/o cough, prn Robitussin given. Family at bedside. Call bell within reach and bed alarm set.

## 2015-05-29 NOTE — Progress Notes (Signed)
[facility].  Progress Note    Assessment:   Stage IV CKD   Pulmonary edema   DM   HTN   Anemia   Proteinuria   Dyslipidemia    Plan:   Continue with diuresis   Daily labs   D/W family re: salt and fluid restriction.        Queen Slough, MD  Office - [phone]  ++++++++++++++++++++++++++++++++++++++++++++++++++++++++++++++  Subjective:  SOB on exertion    Medications:  Scheduled Meds:  Current Facility-Administered Medications   Medication Dose Route Frequency   . amLODIPine  10 mg Oral Daily   . aspirin EC  81 mg Oral Daily   . atorvastatin  40 mg Oral QPM   . bumetanide  1 mg Intravenous BID   . doxazosin  4 mg Oral QHS   . heparin (porcine)  5,000 Units Subcutaneous Q8H [place]   . hydrALAZINE  100 mg Oral TID   . insulin glargine  10 Units Subcutaneous QHS   . isosorbide mononitrate  120 mg Oral Daily   . pantoprazole  40 mg Oral BID AC   . SITagliptin  25 mg Oral Daily     Continuous Infusions:   PRN Meds:acetaminophen, albuterol-ipratropium, benzonatate, Nursing communication: Adult Hypoglycemia Treatment Algorithm **AND** dextrose **AND** dextrose **AND** glucagon (rDNA), guaiFENesin, insulin lispro, insulin lispro, ondansetron    Objective:  Vital signs in last 24 hours:  Temp:  [97.4 F (36.3 C)-99.5 F (37.5 C)] 99.3 F (37.4 C)  Heart Rate:  [42-56] 51  Resp Rate:  [19-26] 26  BP: (142-166)/(61-74) 166/74 mmHg    Intake/Output from yesterday (07:01 - 07:00):  02/22 0701 - 02/23 0700  In: 120 [P.O.:120]  Out: 550 [Urine:550]     Physical Exam:   Gen: WD WN NAD   CV: S1 S2 N RRR   Chest: CTAB   Ab: ND NT soft no HSM +BS   Ext: No C/E    Labs:    Recent Labs  Lab 05/29/15  0704 05/28/15  0638 05/27/15  0608 05/26/15  1324   GLUCOSE 134* 169* 151* 150*   BUN 52.7* 46.5* 42.5* 42.6*   CREATININE 2.9* 2.8* 2.4* 2.7*   CALCIUM 8.9 9.2 8.7 8.6   SODIUM 136 138 138 137   POTASSIUM 4.1 4.3 4.1 3.9   CHLORIDE 102 103 105 103   CO2 22 21* 25 26   ALBUMIN  --   --   --   2.9*   MAGNESIUM  --   --  1.7  --        Recent Labs  Lab 05/29/15  0704 05/28/15  0638 05/27/15  0608   WBC 6.54 6.59 5.71   HGB 9.3* 9.6* 8.8*   HEMATOCRIT 28.6* 29.2* 27.2*   MCV 91.1 91.5 91.6   MCH 29.6 30.1 29.6   MCHC 32.5 32.9 32.4   RDW 13 13 13    MPV 9.7 10.0 9.9   PLATELETS 265 275 284

## 2015-05-30 LAB — BASIC METABOLIC PANEL
Anion Gap: 12 (ref 5.0–15.0)
BUN: 51.9 mg/dL — ABNORMAL HIGH (ref 9.0–28.0)
CO2: 23 mEq/L (ref 22–29)
Calcium: 9.1 mg/dL (ref 7.9–10.2)
Chloride: 102 mEq/L (ref 100–111)
Creatinine: 2.9 mg/dL — ABNORMAL HIGH (ref 0.7–1.3)
Glucose: 152 mg/dL — ABNORMAL HIGH (ref 70–100)
Potassium: 4.3 mEq/L (ref 3.5–5.1)
Sodium: 137 mEq/L (ref 136–145)

## 2015-05-30 LAB — GFR: EGFR: 20.7

## 2015-05-30 LAB — PROTEIN / CREATININE RATIO, URINE
Urine Creatinine, Random: 63.5
Urine Protein Random: 42.2 mg/dL — ABNORMAL HIGH (ref 1.0–14.0)
Urine Protein/Creatinine Ratio: 0.7

## 2015-05-30 LAB — GLUCOSE WHOLE BLOOD - POCT
Whole Blood Glucose POCT: 164 mg/dL — ABNORMAL HIGH (ref 70–100)
Whole Blood Glucose POCT: 188 mg/dL — ABNORMAL HIGH (ref 70–100)
Whole Blood Glucose POCT: 112 mg/dL — ABNORMAL HIGH (ref 70–100)
Whole Blood Glucose POCT: 136 mg/dL — ABNORMAL HIGH (ref 70–100)

## 2015-05-30 MED ORDER — METOLAZONE 5 MG PO TABS
5.0000 mg | ORAL_TABLET | ORAL | Status: AC
Start: 2015-05-30 — End: 2015-05-31
  Administered 2015-05-30 – 2015-05-31 (×2): 5 mg via ORAL
  Filled 2015-05-30 (×2): qty 1

## 2015-05-30 NOTE — Plan of Care (Signed)
Problem: Safety  Goal: Patient will be free from injury during hospitalization  Outcome: Progressing  Intervention: Provide and maintain safe environment  .  Intervention: Hourly rounding.  .      Problem: Pain  Goal: Patient's pain/discomfort is manageable  Outcome: Progressing

## 2015-05-30 NOTE — Progress Notes (Signed)
[facility]  Progress Note    Patient Info:   Date/Time: 05/30/2015 / 12:48 PM   Patient Name:Marco Bruce   VN:[id number]    PCP: Harlow Ohms, MD   Admit Date:05/26/2015   Attending Physician:Lilyona Richner, Derrek Gu, MD      Assessment and Plan:   1. Pulmonary edema-patient has severe pulmonary hypertension on his echocardiogram.  2-D echo, ejection fraction 75 percent, severe mitral and tricuspid regurgitation.  Severe left atrial enlargement  Severe pulmonary hypertension   Appreciate cardiology evaluation. Diuresing well  Will continue IV diuresis with Bumex twice daily. Responding well.  Creatinine stable  Records from [facility] His creatinine 2 weeks ago was 2.2, unknown baseline    Patient's pro-calcitonin is negative. We will hold off on antibiotics for now. Patient has not spiked a fever, nor has an elevated white blood cell count.  Continue hydralazine , imdur     With chest x-ray showing pulmonary edema.  No focal consolidation pro-calcitonin negative, no leukocytosis- unlikely to be pneumonia    2. Acute Kidney injury with unknown Chronic kidney disease-patient's family notes that the patient had kidney issues in [place].   Discussed with daughter on the phone.  She is aware  about Ckd but does not remember the labs  Renal ultrasound no acute findings or obstrutcion.    Daughter understands that if creatinine gets worse and patient is still symptomatic, he might need dialysis    3. Diabetes mellitus-we will continue sliding scale insulin.   A1c 9.6.  Restart Januvia and Lantus    4. Elevated troponin-patient was evaluated by cardiology.   Echocardiogram - EF 75%, reviewed  Continue diuresis. May need outpatient stress test.   Continue present meds. Patient is  on 81 mg aspirin.    5. Anemia-patient is not showing any signs of bleeding.  Could be chronic from CKD    6. Hypertension- Controlled patient on hydralazine and amlodipine. Patient is on isosorbide  mononitrate. We will keep his medication regimen the same at this time.  Bradycardia with heart rate of 40s, but asymptomatic.  We will monitor.  Not on any medications that could cause Bradycardia.    7. Right upper shoulder swelling-Venous  duplex of the right upper extremity negative    8.  Scattered wheezing, place him on when necessary nebulizers, no signs of acute bronchospasm   Continue to monitor     Condition guarded   Cardiology and Nephrology input appreciated   Continue Diuretics  And moniter creatinine.  Encourage ambulation , discussed with son at bedside    Reviewed labs, imaging, vitals   Discussed  family members at bedside  Evaluate need for home oxygen, wean off oxygen        DVT Prohylaxis:heaprin   Code Status: Full Code   Disposition: home vs snif   Type of Admission:Inpatient   Estimated Length of Stay (including stay in the ER receiving treatment): >48 hrs   Medical Necessity for stay:as above     Subjective:   Chief Complaint:  Overall feeling better.  Shortness of breath has improved  Urinating fine.  Lower extremity edema  Denies any chest pain  No new complaints  Chief Complaint   Patient presents with   . Shortness of Breath      Update of Review of Systems:Review of Systems   Constitutional: Negative for fever and chills.   HENT: Negative for congestion.    Eyes: Negative for blurred vision and double vision.  Respiratory: Positive for cough and shortness of breath. Negative for wheezing.    Cardiovascular: Negative for chest pain and palpitations.   Gastrointestinal: Negative for heartburn, nausea, vomiting and diarrhea.   Genitourinary: Negative for dysuria, urgency, frequency and hematuria.   Musculoskeletal: Negative for myalgias.   Neurological: Negative for dizziness, sensory change, speech change and focal weakness.   Psychiatric/Behavioral: Negative for depression.       Allergies:No Known Allergies   Medications:Meds have been reviewed   Current Facility-Administered  Medications   Medication Dose Route Frequency Last Rate Last Dose   . acetaminophen (TYLENOL) tablet 650 mg  650 mg Oral Q4H PRN   650 mg at 05/29/15 1720   . albuterol-ipratropium (DUO-NEB) 2.5-0.5(3) mg/3 mL nebulizer 3 mL  3 mL Nebulization Q6H PRN       . amLODIPine (NORVASC) tablet 10 mg  10 mg Oral Daily   10 mg at 05/30/15 1000   . aspirin EC tablet 81 mg  81 mg Oral Daily   81 mg at 05/30/15 0959   . atorvastatin (LIPITOR) tablet 40 mg  40 mg Oral QPM   40 mg at 05/29/15 1720   . benzonatate (TESSALON) capsule 100 mg  100 mg Oral TID PRN   100 mg at 05/30/15 1213   . bumetanide (BUMEX) injection 1 mg  1 mg Intravenous BID   1 mg at 05/30/15 0848   . dextrose (GLUCOSE) 40 % oral gel 15 g of glucose  15 g of glucose Oral PRN        And   . dextrose 50 % bolus 25 mL  25 mL Intravenous PRN        And   . glucagon (rDNA) (GLUCAGEN) injection 1 mg  1 mg Intramuscular PRN       . doxazosin (CARDURA) tablet 4 mg  4 mg Oral QHS   4 mg at 05/29/15 2121   . guaiFENesin (ROBITUSSIN) oral solution 200 mg  200 mg Oral Q4H PRN   200 mg at 05/29/15 1036   . heparin (porcine) injection 5,000 Units  5,000 Units Subcutaneous Q8H [place]   5,000 Units at 05/30/15 0627   . hydrALAZINE (APRESOLINE) tablet 100 mg  100 mg Oral TID   100 mg at 05/30/15 [id number]   . insulin glargine (LANTUS) injection 10 Units  10 Units Subcutaneous QHS   10 Units at 05/29/15 2121   . insulin lispro (HumaLOG) injection 1-3 Units  1-3 Units Subcutaneous QHS PRN   1 Units at 05/28/15 2232   . insulin lispro (HumaLOG) injection 1-5 Units  1-5 Units Subcutaneous TID AC PRN   1 Units at 05/30/15 1214   . isosorbide mononitrate (IMDUR) 24 hr tablet 120 mg  120 mg Oral Daily   120 mg at 05/30/15 0959   . ondansetron (ZOFRAN) injection 4 mg  4 mg Intravenous Once PRN       . pantoprazole (PROTONIX) EC tablet 40 mg  40 mg Oral BID AC   40 mg at 05/30/15 0849   . SITagliptin (JANUVIA) tablet 25 mg  25 mg Oral Daily   25 mg at 05/30/15 1000        Objective:   Vitals:  Vitals reviewed height is 1.575 m (5\' 2" ) and weight is 73.029 kg (161 lb). His temporal artery temperature is 98.2 F (36.8 C). His blood pressure is 142/63 and his pulse is 48. His respiration is 20 and oxygen saturation is 96%. Body mass index is 29.44  kg/(m^2).  Filed Vitals:    05/30/15 0724 05/30/15 0925 05/30/15 0958 05/30/15 1100   BP:  142/63     Pulse: 56 51 55 48   Temp:  98.2 F (36.8 C)     TempSrc:  Temporal Artery     Resp:  20     Height:       Weight:       SpO2:  96%     Intake and Output Summary (Last 24 hours) at Date Time     Intake/Output Summary (Last 24 hours) at 05/30/15 1248  Last data filed at 05/30/15 0121   Gross per 24 hour   Intake    120 ml   Output   1100 ml   Net   -980 ml      Physical Exam: Physical Exam   Constitutional: No distress.   HENT:   Head: Normocephalic and atraumatic.   Mouth/Throat: No oropharyngeal exudate.   Eyes: EOM are normal. Pupils are equal, round, and reactive to light. No scleral icterus.   Neck: Normal range of motion. JVD present. No thyromegaly present.   Cardiovascular: Normal rate and regular rhythm.    No murmur heard.  Pulmonary/Chest: Effort normal. He has no wheezes. He has rales.   Not using any accessory muscles of respiration    Bilateral rales   Abdominal: Soft. Bowel sounds are normal. He exhibits no distension. There is no tenderness. There is no rebound and no guarding.   Musculoskeletal: Normal range of motion. He exhibits edema. He exhibits no tenderness.   1+-2 pitting  edema   Neurological: He is alert. No cranial nerve deficit. GCS score is 15.   Non english speaking   Skin: Skin is warm.   Psychiatric: Affect normal.          Results of Labs/imaging   Labs have been reviewed:   Coagulation Profile:        CBC review:     Recent Labs  Lab 05/29/15  0704 05/28/15  0638 05/27/15  0608 05/26/15  1324   WBC 6.54 6.59 5.71 6.43   HGB 9.3* 9.6* 8.8* 9.5*   HEMATOCRIT 28.6* 29.2* 27.2* 28.6*   PLATELETS 265 275 284 303   MCV 91.1 91.5 91.6  91.1   RDW 13 13 13 13    NEUTROPHILS  --   --   --  61   LYMPHOCYTES AUTOMATED  --   --   --  20   EOSINOPHILS AUTOMATED  --   --   --  9   IMMATURE GRANULOCYTE  --   --   --  0   NEUTROPHILS ABSOLUTE  --   --   --  3.94   ABSOLUTE IMMATURE GRANULOCYTE  --   --   --  0.01      Chem Review:    Recent Labs  Lab 05/30/15  0813 05/29/15  0704 05/28/15  0638 05/27/15  0608 05/26/15  1324   SODIUM 137 136 138 138 137   POTASSIUM 4.3 4.1 4.3 4.1 3.9   CHLORIDE 102 102 103 105 103   CO2 23 22 21* 25 26   BUN 51.9* 52.7* 46.5* 42.5* 42.6*   CREATININE 2.9* 2.9* 2.8* 2.4* 2.7*   GLUCOSE 152* 134* 169* 151* 150*   CALCIUM 9.1 8.9 9.2 8.7 8.6   MAGNESIUM  --   --   --  1.7  --    BILIRUBIN, TOTAL  --   --   --   --  0.4   AST (SGOT)  --   --   --   --  33   ALT  --   --   --   --  26   ALKALINE PHOSPHATASE  --   --   --   --  110*      Results     Procedure Component Value Units Date/Time    Protein / creatinine ratio, urine [id number] Collected:  05/30/15 1215    Specimen Information:  Urine Updated:  05/30/15 [id number]    Basic Metabolic Panel [id number]  (Abnormal) Collected:  05/30/15 0813    Specimen Information:  Blood Updated:  05/30/15 0903     Glucose 152 (H) mg/dL      BUN 51.9 (H) mg/dL      Creatinine 2.9 (H) mg/dL      Calcium 9.1 mg/dL      Sodium 137 mEq/L      Potassium 4.3 mEq/L      Chloride 102 mEq/L      CO2 23 mEq/L      Anion Gap 12.0     GFR [id number] Collected:  05/30/15 0813     EGFR 20.7 Updated:  05/30/15 0903    Glucose Whole Blood - POCT [id number]  (Abnormal) Collected:  05/30/15 0729     POCT - Glucose Whole blood 164 (H) mg/dL Updated:  05/30/15 0733    Glucose Whole Blood - POCT [id number]  (Abnormal) Collected:  05/29/15 2053     POCT - Glucose Whole blood 142 (H) mg/dL Updated:  05/29/15 2056    Blood Culture Aerobic/Anaerobic #1 [id number] Collected:  05/26/15 1323    Specimen Information:  Arm from Blood Updated:  05/29/15 1721    Narrative:      ORDER#: [id number]                                     ORDERED BY: POTLURI, JAGADI  SOURCE: Blood lt ac                                  COLLECTED:  05/26/15 13:23  ANTIBIOTICS AT COLL.:                                RECEIVED :  05/26/15 17:18  Culture Blood Aerobic and Anaerobic        PRELIM      05/29/15 17:21  05/27/15   No Growth after 1 day/s of incubation.  05/28/15   No Growth after 2 day/s of incubation.  05/29/15   No Growth after 3 day/s of incubation.      Blood Culture Aerobic/Anaerobic #2 [id number] Collected:  05/26/15 1323    Specimen Information:  Arm from Blood Updated:  05/29/15 1721    Narrative:      ORDER#: [id number]                                    ORDERED BY: Lowella Dandy  SOURCE: Blood lt hand                                COLLECTED:  05/26/15 13:23  ANTIBIOTICS AT COLL.:                                RECEIVED :  05/26/15 17:17  Culture Blood Aerobic and Anaerobic        PRELIM      05/29/15 17:21  05/27/15   No Growth after 1 day/s of incubation.  05/28/15   No Growth after 2 day/s of incubation.  05/29/15   No Growth after 3 day/s of incubation.      Glucose Whole Blood - POCT [id number]  (Abnormal) Collected:  05/29/15 1644     POCT - Glucose Whole blood 107 (H) mg/dL Updated:  05/29/15 1647         Radiology reports have been reviewed:  Radiology Results (24 Hour)     ** No results found for the last 24 hours. **                Hospitalist   Signed by: Melbourne Abts   05/30/2015 12:48 PM

## 2015-05-30 NOTE — Progress Notes (Signed)
CM requested Anderson Malta, the HHL, at 6637 to add a oxygen template so that the nurse can document oxygen saturations to determine if patient meets criteria for home oxygen.

## 2015-05-30 NOTE — Progress Notes (Signed)
[facility].  Progress Note    Assessment:   Stage IV CKD   Pulmonary edema   DM   HTN   Anemia   Proteinuria   Dyslipidemia    Plan:   Continue with diuresis - Metolazone 5 mg X 2   Daily labs   D/W family re: salt and fluid restriction.        Marco Slough, MD  Office - [phone]  ++++++++++++++++++++++++++++++++++++++++++++++++++++++++++++++  Subjective:  SOB on exertion    Medications:  Scheduled Meds:  Current Facility-Administered Medications   Medication Dose Route Frequency   . amLODIPine  10 mg Oral Daily   . aspirin EC  81 mg Oral Daily   . atorvastatin  40 mg Oral QPM   . bumetanide  1 mg Intravenous BID   . doxazosin  4 mg Oral QHS   . heparin (porcine)  5,000 Units Subcutaneous Q8H [place]   . hydrALAZINE  100 mg Oral TID   . insulin glargine  10 Units Subcutaneous QHS   . isosorbide mononitrate  120 mg Oral Daily   . metOLazone  5 mg Oral After breakfast   . pantoprazole  40 mg Oral BID AC   . SITagliptin  25 mg Oral Daily     Continuous Infusions:   PRN Meds:acetaminophen, albuterol-ipratropium, benzonatate, Nursing communication: Adult Hypoglycemia Treatment Algorithm **AND** dextrose **AND** dextrose **AND** glucagon (rDNA), guaiFENesin, insulin lispro, insulin lispro, ondansetron    Objective:  Vital signs in last 24 hours:  Temp:  [97.6 F (36.4 C)-98.7 F (37.1 C)] 98.3 F (36.8 C)  Heart Rate:  [46-56] 51  Resp Rate:  [17-20] 18  BP: (140-178)/(63-72) 168/70 mmHg    Intake/Output from yesterday (07:01 - 07:00):  02/23 0701 - 02/24 0700  In: 240 [P.O.:240]  Out: 1100 [Urine:1100]     Physical Exam:   Gen: WD WN NAD   CV: S1 S2 N RRR   Chest: crackles   Ab: ND NT soft no HSM +BS   Ext: 2+ edema    Labs:    Recent Labs  Lab 05/30/15  0813 05/29/15  0704 05/28/15  0638 05/27/15  0608 05/26/15  1324   GLUCOSE 152* 134* 169* 151* 150*   BUN 51.9* 52.7* 46.5* 42.5* 42.6*   CREATININE 2.9* 2.9* 2.8* 2.4* 2.7*   CALCIUM 9.1 8.9 9.2 8.7 8.6   SODIUM 137 136  138 138 137   POTASSIUM 4.3 4.1 4.3 4.1 3.9   CHLORIDE 102 102 103 105 103   CO2 23 22 21* 25 26   ALBUMIN  --   --   --   --  2.9*   MAGNESIUM  --   --   --  1.7  --        Recent Labs  Lab 05/29/15  0704 05/28/15  0638 05/27/15  0608   WBC 6.54 6.59 5.71   HGB 9.3* 9.6* 8.8*   HEMATOCRIT 28.6* 29.2* 27.2*   MCV 91.1 91.5 91.6   MCH 29.6 30.1 29.6   MCHC 32.5 32.9 32.4   RDW 13 13 13    MPV 9.7 10.0 9.9   PLATELETS 265 275 284

## 2015-05-30 NOTE — Progress Notes (Deleted)
05/30/15 1002   Discharge Disposition   Physical Discharge Disposition Expired  (patient died in the ICU early this morning as documented by the nurse as well as MD in the chart)

## 2015-05-30 NOTE — Progress Notes (Signed)
PULSE OXIMETRY TESTING:   Document patient's oxygen at rest on room air:   "_____% on room air, at rest" (exact number, NO RANGES please)   "_____% on oxygen, at rest, at_____LPM via NC (TO SHOW IMPROVEMENT)     IF 88% OR BELOW on room air, STOP HERE,   IF NOT ambulate patient on room air with exertion and document below:   "_____% on room air, with exertion" (must be 88% or below for pt to qualify for home oxygen)   "_____% on oxygen, with exertion, at_____LPM via NC (how much oxygen does patient need to get sats up above 88%?)     All three tests must be during the same session.   Please document in a PROGRESS NOTE.   Testing to qualify for home oxygen must be no earlier than 48 hours prior to discharge, or it will need to be repeated.     Call x [phone] when completed.

## 2015-05-30 NOTE — Plan of Care (Signed)
Problem: Safety  Goal: Patient will be free from injury during hospitalization  Outcome: Progressing  Patient resting in bed, cough medication given, family at bedside. Call bell within reach, bed alarm set.

## 2015-05-30 NOTE — Progress Notes (Signed)
Dr. Maeola Harman, during rounds, told the Case Manager that patient may go home over the weekend.  Case Manager met with patient's adult grandson in the room and he agrees with the plan.  CM discussed with grandson that patient will need supervision at home and he told the CM that patient's wife and he himself stay home with the patient and will be able to provide supervision at home as recommended by the PT/OT professionals.   Yolanda Bonine is aware of the [place] discharge clinic appointment on 06/03/15 at 8:30 am

## 2015-05-30 NOTE — Progress Notes (Addendum)
[place]    Date Time: 05/30/2015 2:15 PM  Patient Name: [facility]           Assessment:      Acute diastolic HF with volume overload.   EF 57% Echo 05-27-15: Severe MR, severe TR, severe pulm HTN, normal LV systolic function, mild AS, mild-mod AI.   Dyspnea.   HTN.   DM.   Anemia.   CKD IV.   Mild nonspecific TnI elevation (0.10) in setting of diastolic HF.   HLD.   Former Tobacco use. ? COPD.    Recommendations:    ? If pt needs higher dose of bumex, will defer to nephrology currently on 1 BID (creat at 2.9)   On hydralazine 100 TID and imdur 120 (no ARB/ACE due to renal dyfunction)   norvasc 10, no BB due to baseline bradycardia      Medications:      Scheduled Meds: PRN Meds:      amLODIPine 10 mg Oral Daily   aspirin EC 81 mg Oral Daily   atorvastatin 40 mg Oral QPM   bumetanide 1 mg Intravenous BID   doxazosin 4 mg Oral QHS   heparin (porcine) 5,000 Units Subcutaneous Q8H [place]   hydrALAZINE 100 mg Oral TID   insulin glargine 10 Units Subcutaneous QHS   isosorbide mononitrate 120 mg Oral Daily   pantoprazole 40 mg Oral BID AC   SITagliptin 25 mg Oral Daily       Continuous Infusions:      acetaminophen 650 mg Q4H PRN   albuterol-ipratropium 3 mL Q6H PRN   benzonatate 100 mg TID PRN   dextrose 15 g of glucose PRN   And     dextrose 25 mL PRN   And     glucagon (rDNA) 1 mg PRN   guaiFENesin 200 mg Q4H PRN   insulin lispro 1-3 Units QHS PRN   insulin lispro 1-5 Units TID AC PRN   ondansetron 4 mg Once PRN             Subjective:   Denies chest pain, SOB or palpitations.      Physical Exam:     Filed Vitals:    05/30/15 1348   BP: 168/70   Pulse: 51   Temp: 98.3 F (36.8 C)   Resp: 18   SpO2: 95%     Temp (24hrs), Avg:98.3 F (36.8 C), Min:97.6 F (36.4 C), Max:99 F (37.2 C)    Weight Monitoring 05/26/2015 05/26/2015 05/27/2015 05/28/2015   Height 157.5 cm 157.5 cm - -   Height Method Stated - - -   Weight 72.576 kg 77.384 kg 77.565 kg 73.029 kg   Weight Method  Stated Bed Scale - Standing Scale   BMI (calculated) 29.3 kg/m2 31.3 kg/m2 - -          Telemetry reviewed no changes.  SR/SB (mid 50's)     Intake and Output Summary (Last 24 hours) at Date Time    Intake/Output Summary (Last 24 hours) at 05/30/15 1415  Last data filed at 05/30/15 1100   Gross per 24 hour   Intake      0 ml   Output    750 ml   Net   -750 ml     General Appearance:  Breathing comfortable, no acute distress  Neck:  No carotid bruit or jugular venous distension, brisk carotid upstroke  Lungs:  Clear to auscultation throughout, no wheezes, rhonchi  or rales, good respiratory effort   Heart:  S1, S2 normal, no S3, no S4, no murmur, PMI not displaced, no rub   Abdomen:  Soft, non-tender, positive bowel sounds, no hepatojugular reflux  Extremities:  No cyanosis, clubbing, BLE edema  Pulses:  Equal radial pulses, 4/4 symmetric  Neurologic:  Alert and oriented x3, mood and affect normal    Labs:     Recent Labs  Lab 05/27/15  0031 05/26/15  1913 05/26/15  1323   TROPONIN I 0.08 0.08 0.10*       Recent Labs  Lab 05/26/15  1324   BILIRUBIN, TOTAL 0.4   PROTEIN, TOTAL 6.3   ALBUMIN 2.9*   ALT 26   AST (SGOT) 33       Recent Labs  Lab 05/27/15  0608   MAGNESIUM 1.7           Recent Labs  Lab 05/29/15  0704 05/28/15  0638 05/27/15  0608   WBC 6.54 6.59 5.71   HGB 9.3* 9.6* 8.8*   HEMATOCRIT 28.6* 29.2* 27.2*   PLATELETS 265 275 284       Recent Labs  Lab 05/30/15  0813 05/29/15  0704 05/28/15  0638   SODIUM 137 136 138   POTASSIUM 4.3 4.1 4.3   CHLORIDE 102 102 103   CO2 23 22 21*   BUN 51.9* 52.7* 46.5*   CREATININE 2.9* 2.9* 2.8*   EGFR 20.7 20.7 21.6   GLUCOSE 152* 134* 169*   CALCIUM 9.1 8.9 9.2     Estimated Creatinine Clearance: 16 mL/min (based on Cr of 2.9).      Lab Results   Component Value Date    BNP 643.4* 05/26/2015      Imaging:         Signed by: Lorelee Cover, PA    Patient seen and examined.  Agree with NP/PA note, exam, and plan as above with changes in italics.    Fraser Busche A. Jessy Oto, MD,  [place]  NP [place] (8am-5pm)  MD Spectralink [phone] (8am-5pm)  After hours, non urgent consult line [phone]  After Hours, urgent consults [phone]

## 2015-05-31 DIAGNOSIS — N184 Chronic kidney disease, stage 4 (severe): Secondary | ICD-10-CM | POA: Insufficient documentation

## 2015-05-31 DIAGNOSIS — I5031 Acute diastolic (congestive) heart failure: Secondary | ICD-10-CM | POA: Insufficient documentation

## 2015-05-31 LAB — GLUCOSE WHOLE BLOOD - POCT
Whole Blood Glucose POCT: 124 mg/dL — ABNORMAL HIGH (ref 70–100)
Whole Blood Glucose POCT: 173 mg/dL — ABNORMAL HIGH (ref 70–100)
Whole Blood Glucose POCT: 139 mg/dL — ABNORMAL HIGH (ref 70–100)
Whole Blood Glucose POCT: 146 mg/dL — ABNORMAL HIGH (ref 70–100)

## 2015-05-31 LAB — BASIC METABOLIC PANEL
Anion Gap: 13 (ref 5.0–15.0)
BUN: 51.6 mg/dL — ABNORMAL HIGH (ref 9.0–28.0)
CO2: 21 mEq/L — ABNORMAL LOW (ref 22–29)
Calcium: 8.8 mg/dL (ref 7.9–10.2)
Chloride: 100 mEq/L (ref 100–111)
Creatinine: 2.5 mg/dL — ABNORMAL HIGH (ref 0.7–1.3)
Glucose: 122 mg/dL — ABNORMAL HIGH (ref 70–100)
Potassium: 4.1 mEq/L (ref 3.5–5.1)
Sodium: 134 mEq/L — ABNORMAL LOW (ref 136–145)

## 2015-05-31 LAB — GFR: EGFR: 24.6

## 2015-05-31 MED ORDER — BUMETANIDE 0.25 MG/ML IJ SOLN
2.0000 mg | Freq: Two times a day (BID) | INTRAMUSCULAR | Status: DC
Start: 2015-06-01 — End: 2015-06-02
  Administered 2015-06-01 – 2015-06-02 (×3): 2 mg via INTRAVENOUS
  Filled 2015-05-31 (×2): qty 10
  Filled 2015-05-31: qty 20

## 2015-05-31 MED ORDER — ASPIRIN 81 MG PO TBEC
81.0000 mg | DELAYED_RELEASE_TABLET | Freq: Every day | ORAL | Status: DC
Start: 2015-06-01 — End: 2015-06-03
  Administered 2015-06-01 – 2015-06-02 (×2): 81 mg via ORAL
  Filled 2015-05-31 (×2): qty 1

## 2015-05-31 NOTE — Progress Notes (Addendum)
[place]    Date Time: 05/31/2015 10:52 AM  Patient Name: Marco Bruce       Assessment:      Acute diastolic HF with volume overload.   EF 57% Echo 05-27-15: Severe MR, severe TR, severe pulm HTN, normal LV systolic function, mild AS, mild-mod AI.   Dyspnea.   HTN = uncontrolled   DM.   Anemia.   CKD IV.   Mild nonspecific TnI elevation (0.10) in setting of diastolic HF.   HLD.   Former Tobacco use. ? COPD.      Recommendations:         Will defer diuresis to renal. Lost 4 kg from admission.   Continue hydralazine, Imdur, amlodipine and Bumex for hypertension. Avoid ARB and ACE due to renal dysfunction. Continue to monitor BP closely.   No betablocker due to baseline bradycardia.        Patient is improving with less volume overload today then yesterday but renal function has worsened.  Will defer to nephrology for diuresis.    Medications:      Scheduled Meds: PRN Meds:      amLODIPine 10 mg Oral Daily   [START ON 06/01/2015] aspirin EC 81 mg Oral Daily   atorvastatin 40 mg Oral QPM   bumetanide 1 mg Intravenous BID   doxazosin 4 mg Oral QHS   heparin (porcine) 5,000 Units Subcutaneous Q8H [place]   hydrALAZINE 100 mg Oral TID   insulin glargine 10 Units Subcutaneous QHS   isosorbide mononitrate 120 mg Oral Daily   pantoprazole 40 mg Oral BID AC   SITagliptin 25 mg Oral Daily       Continuous Infusions:      acetaminophen 650 mg Q4H PRN   albuterol-ipratropium 3 mL Q6H PRN   benzonatate 100 mg TID PRN   dextrose 15 g of glucose PRN   And     dextrose 25 mL PRN   And     glucagon (rDNA) 1 mg PRN   guaiFENesin 200 mg Q4H PRN   insulin lispro 1-3 Units QHS PRN   insulin lispro 1-5 Units TID AC PRN   ondansetron 4 mg Once PRN             Subjective:   Denies chest pain, SOB or palpitations. Has limited English. Wife is at the bedside who helped with the interpretation. States that there is shortness of breath and chest pain only with coughing.    Physical Exam:      Filed Vitals:    05/31/15 0940   BP: 197/77   Pulse: 66   Temp: 99.2 F (37.3 C)   Resp: 20   SpO2: 93%     Telemetry : Sinus bradycardia rate 48 to 54 beats per minute.     Intake and Output Summary (Last 24 hours) at Date Time    Intake/Output Summary (Last 24 hours) at 05/31/15 1052  Last data filed at 05/31/15 0900   Gross per 24 hour   Intake   1610 ml   Output   1925 ml   Net   -315 ml       General Appearance:  Breathing comfortable, no acute distress, obese, positive for volume overload.  Head:  normocephalic  Eyes:  EOM's intact  Neck: Positive jugular venous distension  Lungs:  Crackles both bases.  Chest Wall:  No tenderness or deformity  Heart:  S1, S2 normal, no S3, no S4, regular rhythm  Abdomen:  Soft, non-tender, positive bowel sounds  Extremities:   1+ pretibial, ankle and foot edema.  Pulses:  Equal radial pulses, 4/4 symmetric  Neurologic:  Alert and oriented x3, mood and affect normal, as per wife.  Musculoskeletal: normal strength and tone    Labs:     Recent Labs  Lab 05/27/15  0031 05/26/15  1913 05/26/15  1323   TROPONIN I 0.08 0.08 0.10*               Recent Labs  Lab 05/26/15  1324   BILIRUBIN, TOTAL 0.4   PROTEIN, TOTAL 6.3   ALBUMIN 2.9*   ALT 26   AST (SGOT) 33       Recent Labs  Lab 05/27/15  0608   MAGNESIUM 1.7           Recent Labs  Lab 05/29/15  0704 05/28/15  0638 05/27/15  0608   WBC 6.54 6.59 5.71   HGB 9.3* 9.6* 8.8*   HEMATOCRIT 28.6* 29.2* 27.2*   PLATELETS 265 275 284       Recent Labs  Lab 05/31/15  0743 05/30/15  0813 05/29/15  0704   SODIUM 134* 137 136   POTASSIUM 4.1 4.3 4.1   CHLORIDE 100 102 102   CO2 21* 23 22   BUN 51.6* 51.9* 52.7*   CREATININE 2.5* 2.9* 2.9*   EGFR 24.6 20.7 20.7   GLUCOSE 122* 152* 134*   CALCIUM 8.8 9.1 8.9       .  Lab Results   Component Value Date    BNP 643.4* 05/26/2015      Estimated Creatinine Clearance: 18.7 mL/min (based on Cr of 2.5).    Weight Monitoring 05/26/2015 05/26/2015 05/27/2015 05/28/2015 05/30/2015 05/31/2015   Height 157.5 cm  157.5 cm - - - -   Height Method Stated - - - - -   Weight 72.576 kg 77.384 kg 77.565 kg 73.029 kg 74.163 kg 73.573 kg   Weight Method Stated Bed Scale - Standing Scale Standing Scale Standing Scale   BMI (calculated) 29.3 kg/m2 31.3 kg/m2 - - - -         Imaging:   Radiological Procedure reviewed.              Signed by: Oris Drone, NP    Patient seen and examined.  Agree with NP/PA note, exam, and plan as above with changes in italics.    Marco Bruce A. Marco Oto, MD, [place]  NP [place] (8am-5pm)  MD Spect[employer] [phone] (8am-5pm)  After hours, non urgent consult line [phone]  After Hours, urgent consults [phone]

## 2015-05-31 NOTE — Plan of Care (Signed)
Problem: Safety  Goal: Patient will be free from injury during hospitalization  Outcome: Progressing  Patient resting in bed and walking around room with walker.  Family at bedside.      Problem: Pain  Goal: Patient's pain/discomfort is manageable  Outcome: Progressing  Patient resting in bed states mild pain in feet from swelling.  And some pain from coughing. Cough medicine given and feet repositioned.

## 2015-05-31 NOTE — Progress Notes (Signed)
[facility].  Progress Note    Assessment:   Stage IV CKD   Pulmonary edema   DM   HTN   Anemia   Proteinuria   Dyslipidemia    Plan:  -Better but not controlled HTN  -Continue IV Bumex with increased dose of 2 ,mg BID and metolazone  -Limit oral fluid intake to 1 liter  -Daily renal fx    Marco Buffy, MD  Office - [phone]  ++++++++++++++++++++++++++++++++++++++++++++++++++++++++++++++  Subjective:  No new complaints    Medications:  Scheduled Meds:  Current Facility-Administered Medications   Medication Dose Route Frequency   . amLODIPine  10 mg Oral Daily   . [START ON 06/01/2015] aspirin EC  81 mg Oral Daily   . atorvastatin  40 mg Oral QPM   . bumetanide  1 mg Intravenous BID   . doxazosin  4 mg Oral QHS   . heparin (porcine)  5,000 Units Subcutaneous Q8H [place]   . hydrALAZINE  100 mg Oral TID   . insulin glargine  10 Units Subcutaneous QHS   . isosorbide mononitrate  120 mg Oral Daily   . pantoprazole  40 mg Oral BID AC   . SITagliptin  25 mg Oral Daily     Continuous Infusions:   PRN Meds:acetaminophen, albuterol-ipratropium, benzonatate, Nursing communication: Adult Hypoglycemia Treatment Algorithm **AND** dextrose **AND** dextrose **AND** glucagon (rDNA), guaiFENesin, insulin lispro, insulin lispro, ondansetron    Objective:  Vital signs in last 24 hours:  Temp:  [98.2 F (36.8 C)-99.2 F (37.3 C)] 98.2 F (36.8 C)  Heart Rate:  [42-90] 53  Resp Rate:  [18-20] 20  BP: (148-197)/(56-81) 153/70 mmHg  Intake/Output last 24 hours:    Intake/Output Summary (Last 24 hours) at 05/31/15 1859  Last data filed at 05/31/15 1600   Gross per 24 hour   Intake    830 ml   Output   1425 ml   Net   -595 ml     Intake/Output this shift:  I/O this shift:  In: 35 [P.O.:590]  Out: 500 [Urine:500]    Physical Exam:   Gen: WD WN NAD   CV: S1 S2 N RRR   Chest: rales+   Ab: ND NT soft no HSM +BS   Ext: 1+edema    Labs:    Recent Labs  Lab 05/31/15  0743 05/30/15  0813  05/29/15  0704  05/27/15  0608 05/26/15  1324   GLUCOSE 122* 152* 134* More results in Results Review 151* 150*   BUN 51.6* 51.9* 52.7* More results in Results Review 42.5* 42.6*   CREATININE 2.5* 2.9* 2.9* More results in Results Review 2.4* 2.7*   CALCIUM 8.8 9.1 8.9 More results in Results Review 8.7 8.6   SODIUM 134* 137 136 More results in Results Review 138 137   POTASSIUM 4.1 4.3 4.1 More results in Results Review 4.1 3.9   CHLORIDE 100 102 102 More results in Results Review 105 103   CO2 21* 23 22 More results in Results Review 25 26   ALBUMIN  --   --   --   --   --  2.9*   MAGNESIUM  --   --   --   --  1.7  --    More results in Results Review = values in this interval not displayed.    Recent Labs  Lab 05/29/15  0704 05/28/15  0638 05/27/15  0608   WBC 6.54 6.59 5.71  HGB 9.3* 9.6* 8.8*   HEMATOCRIT 28.6* 29.2* 27.2*   MCV 91.1 91.5 91.6   MCH 29.6 30.1 29.6   MCHC 32.5 32.9 32.4   RDW 13 13 13    MPV 9.7 10.0 9.9   PLATELETS 265 275 284

## 2015-05-31 NOTE — Progress Notes (Signed)
Patient with family members.  Translator line used x 1 for assessment and for medication explanation.  Patient used bathroom x 4 for urination without using urinal for measuring.  Reinforced the importance of measuring output. Patient and family agreeable.

## 2015-05-31 NOTE — Plan of Care (Signed)
Problem: Health Promotion  Goal: Knowledge - disease process  Extent of understanding conveyed about a specific disease process.   Outcome: Progressing  .    Problem: Safety  Goal: Patient will be free from injury during hospitalization  Outcome: Progressing  .  Intervention: Assess patient's risk for falls and implement fall prevention plan of care per policy  .  Intervention: Provide and maintain safe environment  .  Intervention: Use appropriate transfer methods  .  Intervention: Ensure appropriate safety devices are available at the bedside  .  Intervention: Include patient/family/caregiver in decisions related to safety  .  Intervention: Hourly rounding.  .  Intervention: Assess for patient's risk for elopement and implement Elopement Risk plan per policy  Pt alert & verbally responsive. Oriented x 3. Speaks cambodian only, family at the bedside. Assessment done, denies chest pain, no sob. Vitals stable at this time.  Due meds given & well tolerated. Needs attended. Call light within reach. Will continue to monitor.

## 2015-05-31 NOTE — Progress Notes (Signed)
[facility]  Progress Note    Patient Info:   Date/Time: 05/31/2015 / 11:23 AM   Patient Name:Marco Bruce   VN:[id number]    PCP: Harlow Ohms, MD   Admit Date:05/26/2015   Attending Physician:Lachlan Pelto, Derrek Gu, MD      Assessment and Plan:   1. Pulmonary edema -  Slowly improving , still has rales and edema   Patient has severe pulmonary hypertension on his echocardiogram.  2-D echo, ejection fraction 75 percent, severe mitral and tricuspid regurgitation.  Severe left atrial enlargement  Severe pulmonary hypertension   Appreciate cardiology evaluation. Diuresing well   Down from 77.3- 73.5  On IV diuresis with Bumex twice daily.Metalozone started since yesterday.  Followed by Nephrology    Responding well.  Creatinine  improving  Records from [facility] His creatinine 2 weeks ago was 2.2, unknown baseline   Patient's pro-calcitonin is negative. We will hold off on antibiotics for now. Patient has not spiked a fever, nor has an elevated white blood cell count.  Continue hydralazine , imdur     With chest x-ray showing pulmonary edema.  No focal consolidation pro-calcitonin negative, no leukocytosis- unlikely to be pneumonia    2. Acute Kidney injury with unknown Chronic kidney disease- improving, patient's family staes  that the patient had kidney issues in [place].   Discussed with daughter on the phone.  She is aware  about Ckd but does not remember the labs  Renal ultrasound no acute findings or obstrutcion.  Daughter understands that if creatinine gets worse and patient is still symptomatic, he might need dialysis    3. Diabetes mellitus-we will continue sliding scale insulin.   A1c 9.6.  Restart Januvia and Lantus    4. Elevated troponin-patient was evaluated by cardiology.   Echocardiogram - EF 75%, reviewed  Continue diuresis. May need outpatient stress test.   Continue present meds. Patient is  on 81 mg aspirin.    5. Anemia-patient is not showing any signs  of bleeding.  Could be chronic from CKD    6. Hypertension- moderately Controlled   Patient on hydralazine, IMDUR and amlodipine. Moniter closley and adjust medictaions accordingly    We will keep his medication regimen the same at this time.  Bradycardia with heart rate of 40s, but asymptomatic.  We will monitor.  Not on any medications that could cause Bradycardia.    7. Right upper shoulder swelling-Venous  duplex of the right upper extremity negative    8.  Scattered wheezing, place him on when necessary nebulizers, no signs of acute bronchospasm   Continue to monitor     Condition guarded   Cardiology and Nephrology input appreciated   ON Diuretics  And moniter creatinine.  Encourage ambulation , discussed with daughter at bedside  As per discharge in the next 24- 48 hrs. depending upon clinical improvement  Reviewed labs, imaging, vitals   Evaluate need for home oxygen, wean off oxygen        DVT Prohylaxis:heaprin   Code Status: Full Code   Disposition: home vs snif   Type of Admission:Inpatient   Estimated Length of Stay (including stay in the ER receiving treatment): >48 hrs   Medical Necessity for stay:as above     Subjective:   Chief Complaint:  Overall feeling better.  Still has Shortness of breath, dry cough and lower extremity edema has   Urinating fine.   Denies any chest pain  No new complaints  Chief Complaint   Patient  presents with   . Shortness of Breath      Update of Review of Systems:Review of Systems   Constitutional: Negative for fever and chills.   HENT: Negative for congestion.    Eyes: Negative for blurred vision and double vision.   Respiratory: Positive for cough and shortness of breath. Negative for wheezing.    Cardiovascular: Negative for chest pain and palpitations.   Gastrointestinal: Negative for heartburn, nausea, vomiting and diarrhea.   Genitourinary: Negative for dysuria, urgency, frequency and hematuria.   Musculoskeletal: Negative for myalgias.   Neurological: Negative  for dizziness, sensory change, speech change and focal weakness.   Psychiatric/Behavioral: Negative for depression.       Allergies:No Known Allergies   Medications:Meds have been reviewed   Current Facility-Administered Medications   Medication Dose Route Frequency Last Rate Last Dose   . acetaminophen (TYLENOL) tablet 650 mg  650 mg Oral Q4H PRN   650 mg at 05/29/15 1720   . albuterol-ipratropium (DUO-NEB) 2.5-0.5(3) mg/3 mL nebulizer 3 mL  3 mL Nebulization Q6H PRN       . amLODIPine (NORVASC) tablet 10 mg  10 mg Oral Daily   10 mg at 05/31/15 0948   . [START ON 06/01/2015] aspirin EC tablet 81 mg  81 mg Oral Daily       . atorvastatin (LIPITOR) tablet 40 mg  40 mg Oral QPM   40 mg at 05/30/15 1709   . benzonatate (TESSALON) capsule 100 mg  100 mg Oral TID PRN   100 mg at 05/31/15 0815   . bumetanide (BUMEX) injection 1 mg  1 mg Intravenous BID   1 mg at 05/31/15 0815   . dextrose (GLUCOSE) 40 % oral gel 15 g of glucose  15 g of glucose Oral PRN        And   . dextrose 50 % bolus 25 mL  25 mL Intravenous PRN        And   . glucagon (rDNA) (GLUCAGEN) injection 1 mg  1 mg Intramuscular PRN       . doxazosin (CARDURA) tablet 4 mg  4 mg Oral QHS   4 mg at 05/30/15 2153   . guaiFENesin (ROBITUSSIN) oral solution 200 mg  200 mg Oral Q4H PRN   200 mg at 05/31/15 0347   . heparin (porcine) injection 5,000 Units  5,000 Units Subcutaneous Q8H [place]   5,000 Units at 05/31/15 0549   . hydrALAZINE (APRESOLINE) tablet 100 mg  100 mg Oral TID   100 mg at 05/31/15 0549   . insulin glargine (LANTUS) injection 10 Units  10 Units Subcutaneous QHS   10 Units at 05/30/15 2159   . insulin lispro (HumaLOG) injection 1-3 Units  1-3 Units Subcutaneous QHS PRN   1 Units at 05/28/15 2232   . insulin lispro (HumaLOG) injection 1-5 Units  1-5 Units Subcutaneous TID AC PRN   1 Units at 05/30/15 1214   . isosorbide mononitrate (IMDUR) 24 hr tablet 120 mg  120 mg Oral Daily   120 mg at 05/31/15 0948   . ondansetron (ZOFRAN) injection 4 mg  4 mg  Intravenous Once PRN       . pantoprazole (PROTONIX) EC tablet 40 mg  40 mg Oral BID AC   40 mg at 05/31/15 0815   . SITagliptin (JANUVIA) tablet 25 mg  25 mg Oral Daily   25 mg at 05/31/15 [id number]        Objective:   Vitals: Vitals  reviewed height is 1.575 m (5\' 2" ) and weight is 73.573 kg (162 lb 3.2 oz). His temporal artery temperature is 99.2 F (37.3 C). His blood pressure is 197/77 and his pulse is 66. His respiration is 20 and oxygen saturation is 93%. Body mass index is 29.66 kg/(m^2).  Filed Vitals:    05/31/15 0156 05/31/15 0547 05/31/15 0816 05/31/15 0940   BP: 172/67 189/81 154/56 197/77   Pulse: 42 90 69 66   Temp: 98.4 F (36.9 C) 98.2 F (36.8 C)  99.2 F (37.3 C)   TempSrc: Temporal Artery Temporal Artery  Temporal Artery   Resp: 20 20  20    Height:       Weight:  73.573 kg (162 lb 3.2 oz)     SpO2: 92% 92%  93%   Intake and Output Summary (Last 24 hours) at Date Time     Intake/Output Summary (Last 24 hours) at 05/31/15 1123  Last data filed at 05/31/15 0900   Gross per 24 hour   Intake   1610 ml   Output   1725 ml   Net   -115 ml      Physical Exam: Physical Exam   Constitutional: No distress.   HENT:   Head: Normocephalic and atraumatic.   Mouth/Throat: No oropharyngeal exudate.   Eyes: EOM are normal. Pupils are equal, round, and reactive to light. No scleral icterus.   Neck: Normal range of motion. No thyromegaly present.   Cardiovascular: Normal rate and regular rhythm.    No murmur heard.  Pulmonary/Chest: Effort normal. He has no wheezes. He has rales.   Not using any accessory muscles of respiration    Bilateral rales   Abdominal: Soft. Bowel sounds are normal. He exhibits no distension. There is no tenderness. There is no rebound and no guarding.   Musculoskeletal: Normal range of motion. He exhibits edema. He exhibits no tenderness.   1+-2 pitting  edema   Neurological: He is alert. No cranial nerve deficit. GCS score is 15.   Non english speaking   Skin: Skin is warm.   Psychiatric:  Affect normal.          Results of Labs/imaging   Labs have been reviewed:   Coagulation Profile:        CBC review:     Recent Labs  Lab 05/29/15  0704 05/28/15  0638 05/27/15  0608 05/26/15  1324   WBC 6.54 6.59 5.71 6.43   HGB 9.3* 9.6* 8.8* 9.5*   HEMATOCRIT 28.6* 29.2* 27.2* 28.6*   PLATELETS 265 275 284 303   MCV 91.1 91.5 91.6 91.1   RDW 13 13 13 13    NEUTROPHILS  --   --   --  61   LYMPHOCYTES AUTOMATED  --   --   --  20   EOSINOPHILS AUTOMATED  --   --   --  9   IMMATURE GRANULOCYTE  --   --   --  0   NEUTROPHILS ABSOLUTE  --   --   --  3.94   ABSOLUTE IMMATURE GRANULOCYTE  --   --   --  0.01      Chem Review:    Recent Labs  Lab 05/31/15  0743 05/30/15  0813 05/29/15  0704 05/28/15  0638 05/27/15  0608 05/26/15  1324   SODIUM 134* 137 136 138 138 137   POTASSIUM 4.1 4.3 4.1 4.3 4.1 3.9   CHLORIDE 100 102 102 103 105 103  CO2 21* 23 22 21* 25 26   BUN 51.6* 51.9* 52.7* 46.5* 42.5* 42.6*   CREATININE 2.5* 2.9* 2.9* 2.8* 2.4* 2.7*   GLUCOSE 122* 152* 134* 169* 151* 150*   CALCIUM 8.8 9.1 8.9 9.2 8.7 8.6   MAGNESIUM  --   --   --   --  1.7  --    BILIRUBIN, TOTAL  --   --   --   --   --  0.4   AST (SGOT)  --   --   --   --   --  33   ALT  --   --   --   --   --  26   ALKALINE PHOSPHATASE  --   --   --   --   --  110*      Results     Procedure Component Value Units Date/Time    Basic Metabolic Panel [id number]  (Abnormal) Collected:  05/31/15 0743    Specimen Information:  Blood Updated:  05/31/15 0908     Glucose 122 (H) mg/dL      BUN 51.6 (H) mg/dL      Creatinine 2.5 (H) mg/dL      Calcium 8.8 mg/dL      Sodium 134 (L) mEq/L      Potassium 4.1 mEq/L      Chloride 100 mEq/L      CO2 21 (L) mEq/L      Anion Gap 13.0     GFR [id number] Collected:  05/31/15 0743     EGFR 24.6 Updated:  05/31/15 0908    Glucose Whole Blood - POCT [id number]  (Abnormal) Collected:  05/31/15 0726     POCT - Glucose Whole blood 124 (H) mg/dL Updated:  05/31/15 0729    Glucose Whole Blood - POCT [id number]  (Abnormal)  Collected:  05/30/15 2047     POCT - Glucose Whole blood 136 (H) mg/dL Updated:  05/30/15 2050    Protein / creatinine ratio, urine [id number]  (Abnormal) Collected:  05/30/15 1215    Specimen Information:  Urine Updated:  05/30/15 1730     Urine Protein Random 42.2 (H) mg/dL      Urine Creatinine, Random 63.5      Urine Protein/Creatinine Ratio 0.7     Blood Culture Aerobic/Anaerobic #1 [id number] Collected:  05/26/15 1323    Specimen Information:  Arm from Blood Updated:  05/30/15 1722    Narrative:      ORDER#: [id number]                                    ORDERED BY: POTLURI, JAGADI  SOURCE: Blood lt ac                                  COLLECTED:  05/26/15 13:23  ANTIBIOTICS AT COLL.:                                RECEIVED :  05/26/15 17:18  Culture Blood Aerobic and Anaerobic        PRELIM      05/30/15 17:21  05/27/15   No Growth after 1 day/s of incubation.  05/28/15   No Growth after 2 day/s of incubation.  05/29/15   No Growth  after 3 day/s of incubation.  05/30/15   No Growth after 4 day/s of incubation.      Blood Culture Aerobic/Anaerobic #2 [id number] Collected:  05/26/15 1323    Specimen Information:  Arm from Blood Updated:  05/30/15 1722    Narrative:      ORDER#: [id number]                                    ORDERED BY: POTLURI, JAGADI  SOURCE: Blood lt hand                                COLLECTED:  05/26/15 13:23  ANTIBIOTICS AT COLL.:                                RECEIVED :  05/26/15 17:17  Culture Blood Aerobic and Anaerobic        PRELIM      05/30/15 17:21  05/27/15   No Growth after 1 day/s of incubation.  05/28/15   No Growth after 2 day/s of incubation.  05/29/15   No Growth after 3 day/s of incubation.  05/30/15   No Growth after 4 day/s of incubation.      Glucose Whole Blood - POCT [id number]  (Abnormal) Collected:  05/30/15 1637     POCT - Glucose Whole blood 112 (H) mg/dL Updated:  05/30/15 1705    Glucose Whole Blood - POCT [id number]  (Abnormal) Collected:  05/30/15 1124     POCT  - Glucose Whole blood 188 (H) mg/dL Updated:  05/30/15 1353         Radiology reports have been reviewed:  Radiology Results (24 Hour)     ** No results found for the last 24 hours. **                Hospitalist   Signed by: Melbourne Abts   05/31/2015 11:23 AM

## 2015-06-01 LAB — BASIC METABOLIC PANEL
Anion Gap: 10 (ref 5.0–15.0)
BUN: 46.7 mg/dL — ABNORMAL HIGH (ref 9.0–28.0)
CO2: 24 mEq/L (ref 22–29)
Calcium: 8.5 mg/dL (ref 7.9–10.2)
Chloride: 100 mEq/L (ref 100–111)
Creatinine: 2.3 mg/dL — ABNORMAL HIGH (ref 0.7–1.3)
Glucose: 112 mg/dL — ABNORMAL HIGH (ref 70–100)
Potassium: 3.8 mEq/L (ref 3.5–5.1)
Sodium: 134 mEq/L — ABNORMAL LOW (ref 136–145)

## 2015-06-01 LAB — GFR: EGFR: 27

## 2015-06-01 LAB — GLUCOSE WHOLE BLOOD - POCT
Whole Blood Glucose POCT: 115 mg/dL — ABNORMAL HIGH (ref 70–100)
Whole Blood Glucose POCT: 144 mg/dL — ABNORMAL HIGH (ref 70–100)
Whole Blood Glucose POCT: 119 mg/dL — ABNORMAL HIGH (ref 70–100)
Whole Blood Glucose POCT: 166 mg/dL — ABNORMAL HIGH (ref 70–100)

## 2015-06-01 MED ORDER — METOLAZONE 5 MG PO TABS
5.0000 mg | ORAL_TABLET | Freq: Two times a day (BID) | ORAL | Status: DC
Start: 2015-06-01 — End: 2015-06-02
  Administered 2015-06-01 – 2015-06-02 (×2): 5 mg via ORAL
  Filled 2015-06-01 (×3): qty 1

## 2015-06-01 NOTE — PT Progress Note (Signed)
Physical Therapy Note    [facility]  [place], [place]    Department of Rehabilitation  [phone]    Physical Therapy Daily Treatment Note    Patient: Marco Bruce    MRN#: [id number]     Time of Treatment: Start Time: 1141 Stop Time: 1205 Time Calculation (min): 24 min    PT Visit Number: 2    Patient's medical condition is appropriate for Physical Therapy intervention at this time.    Precautions and Contraindications:   Precautions  Other Precautions: fall risk due to decreased safety awareness    Assessment: All STGs met. Patient now presents with steady ambulation and appears safe to ambulate with staff and/or family until time of D/C.  Assessment: Decreased functional mobility (but now appears safe with amb and steps) Prognosis: Good   Progress: Discontinue PT     Patient Goal: to go home tomorrow      Plan: D/C acute PT.            Based on today's session patient's discharge recommendation is the following: Discharge Recommendation: Home with supervision If Discharge Recommendation: Home with supervision is not available, then the patient will need assistance with IADL's.  DME Recommended for Discharge: Front wheel walker      Subjective: Patient is agreeable to participation in the therapy session.   Pain Assessment  Pain Assessment: No/denies pain     Objective:Patient was instructed in the following functional, neuromuscular and treatment activities: Observation of Patient/Vital Signs:  Patient is in bed with telemetry in place.    Cognition/Neuro Status  Orientation Level: Oriented X4  Behavior: impulsive;cooperative    Functional Mobility  Rolling: Independent  Supine to Sit: Independent  Sit to Stand: Supervision  Stand to Sit: Supervision  Transfers  Bed to Chair: Supervision  Locomotion  Ambulation: Stand by Assist;with front-wheeled walker  Ambulation Distance (Feet): 600 Feet  Pattern: Step through (milf fwd flexed posture)  Stair Management: Stand by Assist;two  rails;step to pattern  Number of Stairs: 8    Verbal instruction for step through sequencing to include correct RW placement with advancement of bilateral LE and proper use of both arms to help compensate for LE weakness and unsteady gait.  Verbal instruction provided for all above functional mobility with facilitation of correct postural alignment ensuring upright posture with shoulder and hip alignment. Educated patient on the importance of coming to a complete stand and establishing posture prior to attempting ambulation or transfers. Facilitated lateral weight shifting through hip and pelvis to facilitate natural postural adjustments during gait.   Instructed patient in safe technique with stair training utilizing a step to pattern with stepping up with stronger lower extremity and bringing the weaker lower extremity to the same step. Instructed patient in using two rails to facilitate proper technique. Instructed patient on descending stairs by stepping down with weaker lower extremity first and then bringing the stronger lower extremity down to the same step. Instructed patient in limiting stairs to once a day when first discharged home to reduce fatigue levels. Advised patient to come down in the morning and remain on one level during the day and then to go up the stairs in the evening ; to always have someone in the home when completing the stairs to reduce fall risk. Instructed patient to have family/friend stand in front of them as they come down the stairs and then behind them as they ascend the stairs.     Neuro  Re-Ed  Standing Balance: patient education;with instruction (for safe turning and backing up with RW)       Educated the patient to role of physical therapy, plan of care, goals of therapy and safety with mobility and ADLs, home safety. Patient was educated in bathroom/home safety, benefit from use of shower seat, grab bars, removal of loose/scatter rugs, seated performance of all ADL's for  safety and benefit from supervision A with all mobility/activities on initial d/c home.  Patient verbalized understanding of all instructions.    Patient left without needs and call bell within reach. Family at bedside Alarm set.  RN notified of session outcome.    Alisia Ferrari PT, DPT, [facility]  Physical Medicine and Rehabilitation Dept  Pager # [phone]

## 2015-06-01 NOTE — Progress Notes (Addendum)
[place]    Date Time: 06/01/2015 10:35 AM  Patient Name: Marco Bruce       Assessment:    Acute diastolic HF with volume overload.   EF 57% Echo 05-27-15: Severe MR, severe TR, severe pulm HTN, normal LV systolic function, mild AS, mild-mod AI.   Dyspnea.   HTN = uncontrolled   DM.   Anemia.   CKD IV.   Mild nonspecific TnI elevation (0.10) in setting of diastolic HF.   HLD.   Former Tobacco use. ? COPD.   Second degree AV block, Mobitz Type 1, Wenkebach    Recommendations:         Will defer diuresis to renal. Lost 1 lb from yesterday.   Monitor accurate intake and output.   Continue hydralazine, Imdur, amlodipine and Bumex for hypertension. Avoid ARB and ACE due to renal dysfunction. Continue to monitor BP closely.   Closely monitor heart rhythm for any progression of AV block. No betablocker due to baseline bradycardia.and now Wenckebach.   Ambulate     Defer BP and fluid management to nephrology but recommend clonidine.           Medications:      Scheduled Meds: PRN Meds:      amLODIPine 10 mg Oral Daily   aspirin EC 81 mg Oral Daily   atorvastatin 40 mg Oral QPM   bumetanide 2 mg Intravenous BID   doxazosin 4 mg Oral QHS   heparin (porcine) 5,000 Units Subcutaneous Q8H [place]   hydrALAZINE 100 mg Oral TID   insulin glargine 10 Units Subcutaneous QHS   isosorbide mononitrate 120 mg Oral Daily   pantoprazole 40 mg Oral BID AC   SITagliptin 25 mg Oral Daily       Continuous Infusions:      acetaminophen 650 mg Q4H PRN   albuterol-ipratropium 3 mL Q6H PRN   benzonatate 100 mg TID PRN   dextrose 15 g of glucose PRN   And     dextrose 25 mL PRN   And     glucagon (rDNA) 1 mg PRN   guaiFENesin 200 mg Q4H PRN   insulin lispro 1-3 Units QHS PRN   insulin lispro 1-5 Units TID AC PRN   ondansetron 4 mg Once PRN             Subjective:   Denies chest pain, SOB or palpitations. Patient does not speak [place]. Daughter is at the bedside who helped with the  interpretation.    Physical Exam:     Filed Vitals:    06/01/15 0900   BP: 140/64   Pulse: 58   Temp: 97.8 F (36.6 C)   Resp: 18   SpO2: 97 on 2L     Telemetry : frequent episodes of second degree AV block, Mobitz Type 1 Wenckebach with VHR in the low 40's .     Intake and Output Summary (Last 24 hours) at Date Time    Intake/Output Summary (Last 24 hours) at 06/01/15 1035  Last data filed at 05/31/15 2135   Gross per 24 hour   Intake    500 ml   Output      0 ml   Net    500 ml       General Appearance:  Breathing comfortable, no acute distress, obese  Head:  normocephalic  Eyes:  EOM's intact  Neck:  Positive jugular venous distension  Lungs:  Crackles both bases.  Chest Wall:  No tenderness or deformity  Heart:  S1, S2 normal, no S3, no S4, regular.   Abdomen:  Soft, non-tender, positive bowel sounds, obese abdomen  Extremities:  1+ thigh edema  Pulses:  Equal radial pulses, 4/4 symmetric  Neurologic:  Alert and oriented x3, mood and affect norma, as per daughter.  Musculoskeletal: normal strength and tone    Labs:     Recent Labs  Lab 05/27/15  0031 05/26/15  1913 05/26/15  1323   TROPONIN I 0.08 0.08 0.10*               Recent Labs  Lab 05/26/15  1324   BILIRUBIN, TOTAL 0.4   PROTEIN, TOTAL 6.3   ALBUMIN 2.9*   ALT 26   AST (SGOT) 33       Recent Labs  Lab 05/27/15  0608   MAGNESIUM 1.7           Recent Labs  Lab 05/29/15  0704 05/28/15  0638 05/27/15  0608   WBC 6.54 6.59 5.71   HGB 9.3* 9.6* 8.8*   HEMATOCRIT 28.6* 29.2* 27.2*   PLATELETS 265 275 284       Recent Labs  Lab 06/01/15  0614 05/31/15  0743 05/30/15  0813   SODIUM 134* 134* 137   POTASSIUM 3.8 4.1 4.3   CHLORIDE 100 100 102   CO2 24 21* 23   BUN 46.7* 51.6* 51.9*   CREATININE 2.3* 2.5* 2.9*   EGFR 27.0 24.6 20.7   GLUCOSE 112* 122* 152*   CALCIUM 8.5 8.8 9.1     .  Lab Results   Component Value Date    BNP 643.4* 05/26/2015      Estimated Creatinine Clearance: 20.3 mL/min (based on Cr of 2.3).    Weight Monitoring 05/26/2015 05/26/2015 05/27/2015  05/28/2015 05/30/2015 05/31/2015   Height 157.5 cm 157.5 cm - - - -   Height Method Stated - - - - -   Weight 72.576 kg 77.384 kg 77.565 kg 73.029 kg 74.163 kg 73.573 kg   Weight Method Stated Bed Scale - Standing Scale Standing Scale Standing Scale   BMI (calculated) 29.3 kg/m2 31.3 kg/m2 - - - -         Imaging:   Radiological Procedure reviewed.              Signed by: Oris Drone, NP    Patient seen and examined.  Agree with NP/PA note, exam, and plan as above with changes in italics.    Jasamine Pottinger A. Jessy Oto, MD, [place]  NP [place] (8am-5pm)  MD Spectralink [phone] (8am-5pm)  After hours, non urgent consult line [phone]  After Hours, urgent consults [phone]

## 2015-06-01 NOTE — Progress Notes (Signed)
06/01/15 2100   Provider Notification   Reason for Communication Evaluate   Provider Name [facility]   Provider Role Hospitalist   Method of Communication Call   Response No new orders   Pt HR in the low 40s while sleeping, MD made aware, pt Asymptomatic. No further orders.

## 2015-06-01 NOTE — Plan of Care (Signed)
Problem: Safety  Goal: Patient will be free from injury during hospitalization  Outcome: Progressing  Call bell with in reach.  Bed in low locked position.  Instructed to call for assistance.  Non slip socks when OOB.  Hourly rounding.   Intervention: Assess patient's risk for falls and implement fall prevention plan of care per policy  Fall risk assessment q shift.  Mod fall risk.   Intervention: Provide and maintain safe environment  .  Intervention: Use appropriate transfer methods  .  Intervention: Ensure appropriate safety devices are available at the bedside  .  Intervention: Include patient/family/caregiver in decisions related to safety  .  Intervention: Hourly rounding.  .  Intervention: Assess for patient's risk for elopement and implement Elopement Risk plan per policy  .      Problem: Moderate/High Fall Risk Score >5  Goal: Patient will remain free of falls  Outcome: Progressing  Call bell with in reach.  Bed in low locked position.  Instructed to call for assistance.  Non slip socks when OOB.  Hourly rounding.     Comments:   Am assessment completed.  Family at bedside this am.  Multiple mds rounded on pt.

## 2015-06-01 NOTE — Plan of Care (Signed)
Pt sleeping comfortably with no apparent distress. No c/o pain or sob.  VSS. SB. Spouse at bedside. Discussed poc with pt and call bell within reach

## 2015-06-01 NOTE — Plan of Care (Signed)
Problem: Safety  Goal: Patient will be free from injury during hospitalization  Outcome: Progressing    Intervention: Assess patient's risk for falls and implement fall prevention plan of care per policy  Intervention: Provide and maintain safe environment  Intervention: Use appropriate transfer methods  Intervention: Ensure appropriate safety devices are available at the bedside  Intervention: Include patient/family/caregiver in decisions related to safety  Intervention: Hourly rounding.

## 2015-06-01 NOTE — Progress Notes (Signed)
[facility].  Progress Note    Assessment:   Stage IV CKD   Pulmonary edema and fluid overload   Second degree AV block, Mobitz Type 1, Wenkebach   DM II   HTN   Anemia   Proteinuria   Dyslipidemia    Plan:  -Needs diuresis and better result with high dose Bumex,renal fx steadily improving  -Will continue Bumex 2 mg IV BID and metolazone 30 min prior to Bumex today as well   -D/W cards and family,Dr.[place]    Flossie Buffy, MD  Office - [phone]  ++++++++++++++++++++++++++++++++++++++++++++++++++++++++++++++  Subjective:  Feels better,ambulating with out oxygen    Medications:  Scheduled Meds:  Current Facility-Administered Medications   Medication Dose Route Frequency   . amLODIPine  10 mg Oral Daily   . aspirin EC  81 mg Oral Daily   . atorvastatin  40 mg Oral QPM   . bumetanide  2 mg Intravenous BID   . doxazosin  4 mg Oral QHS   . heparin (porcine)  5,000 Units Subcutaneous Q8H [place]   . hydrALAZINE  100 mg Oral TID   . insulin glargine  10 Units Subcutaneous QHS   . isosorbide mononitrate  120 mg Oral Daily   . pantoprazole  40 mg Oral BID AC   . SITagliptin  25 mg Oral Daily     Continuous Infusions:   PRN Meds:acetaminophen, albuterol-ipratropium, benzonatate, Nursing communication: Adult Hypoglycemia Treatment Algorithm **AND** dextrose **AND** dextrose **AND** glucagon (rDNA), guaiFENesin, insulin lispro, insulin lispro, ondansetron    Objective:  Vital signs in last 24 hours:  Temp:  [97.8 F (36.6 C)-98.8 F (37.1 C)] 97.8 F (36.6 C)  Heart Rate:  [47-60] 58  Resp Rate:  [18-20] 18  BP: (140-168)/(64-70) 165/65 mmHg  Intake/Output last 24 hours:    Intake/Output Summary (Last 24 hours) at 06/01/15 1257  Last data filed at 05/31/15 2135   Gross per 24 hour   Intake    500 ml   Output      0 ml   Net    500 ml     Intake/Output this shift:       Physical Exam:   Gen: WD WN NAD   Chest: rales+   Ab: ND NT soft no HSM +BS   Ext: 2+thigh  edema    Labs:    Recent Labs  Lab 06/01/15  0614 05/31/15  0743 05/30/15  0813  05/27/15  0608 05/26/15  1324   GLUCOSE 112* 122* 152* More results in Results Review 151* 150*   BUN 46.7* 51.6* 51.9* More results in Results Review 42.5* 42.6*   CREATININE 2.3* 2.5* 2.9* More results in Results Review 2.4* 2.7*   CALCIUM 8.5 8.8 9.1 More results in Results Review 8.7 8.6   SODIUM 134* 134* 137 More results in Results Review 138 137   POTASSIUM 3.8 4.1 4.3 More results in Results Review 4.1 3.9   CHLORIDE 100 100 102 More results in Results Review 105 103   CO2 24 21* 23 More results in Results Review 25 26   ALBUMIN  --   --   --   --   --  2.9*   MAGNESIUM  --   --   --   --  1.7  --    More results in Results Review = values in this interval not displayed.    Recent Labs  Lab 05/29/15  [phone] 05/28/15  [id number] 05/27/15  [id number]   WBC 6.54 6.59 5.71   HGB 9.3* 9.6* 8.8*   HEMATOCRIT 28.6* 29.2* 27.2*   MCV 91.1 91.5 91.6   MCH 29.6 30.1 29.6   MCHC 32.5 32.9 32.4   RDW 13 13 13    MPV 9.7 10.0 9.9   PLATELETS 265 275 284

## 2015-06-01 NOTE — Progress Notes (Signed)
[facility] HOSPITALIST  Progress Note    Patient Info:   Date/Time: 06/01/2015 / 2:03 PM   Patient Name:Marco Bruce   AP:[id number]    PCP: Harlow Ohms, MD   Admit Date:05/26/2015   Attending Physician:Nikiya Starn, Derrek Gu, MD      Assessment and Plan:   1. Pulmonary edema and Volume overload     Improving  , still has rales and edema,diuresed   Patient has severe pulmonary hypertension on his echocardiogram.  2-D echo, ejection fraction 75 percent, severe mitral and tricuspid regurgitation.  Severe left atrial enlargement  Severe pulmonary hypertension   Appreciate cardiology evaluation. Diuresing well   Down from 77.3- 73.5  On IV diuresis with Bumex twice daily (2MG ) and Metolazone  Followed by Nephrology    Responding well.  Creatinine  improving  Records from [facility] His creatinine 2 weeks ago was 2.2, unknown baseline   Patient's pro-calcitonin is negative. We will hold off on antibiotics for now. Patient has not spiked a fever, nor has an elevated white blood cell count.  Continue hydralazine , imdur     With chest x-ray showing pulmonary edema.  No focal consolidation pro-calcitonin negative, no leukocytosis- unlikely to be pneumonia    2. Acute Kidney injury with Chronic kidney disease IV - improving, patient's family states  that the patient had kidney issues in [place].     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 [place]  [facility] on board     4. Elevated troponin-patient was evaluated by cardiology.   Echocardiogram - EF 75%, reviewed  Continue diuresis. May need outpatient stress test.   Continue present meds. Patient is  on 81 mg aspirin.    5. Anemia-patient is not showing any signs of bleeding.  Could be chronic from CKD    6. Hypertension- moderately Controlled, anterior to monitor   Patient on hydralazine, IMDUR and amlodipine. Moniter closley and adjust medictaions accordingly    We  will keep his medication regimen the same at this time.      7. Right upper shoulder swelling-Venous  duplex of the right upper extremity negative    8.  Scattered wheezing, resolved  place him on when necessary nebulizers, no signs of acute bronchospasm  Continue to monitor     9 Diabetes mellitus-we will continue sliding scale insulin.   A1c 9.6.  Restart Januvia and Lantus    Condition guarded   Cardiology and Nephrology input appreciated   Encourage ambulation , discussed with daughter at bedside  As per discharge in the next 24- 48 hrs. depending upon clinical improvement  Reviewed labs, imaging, vitals   Evaluate need for home oxygen before discharge        DVT Prohylaxis:heaprin   Code Status: Full Code   Disposition: home vs snif   Type of Admission:Inpatient   Estimated Length of Stay (including stay in the ER receiving treatment): >48 hrs   Medical Necessity for stay:as above     Subjective:   Chief Complaint:  Overall feeling better.    Lower extremity edema improved significantly.  Still has cough, but slowly improving  Shortness of breath with exertion  Denies any chest pain  No new complaints  Chief Complaint   Patient presents with   . Shortness of Breath      Update of Review of Systems:Review of Systems   Constitutional: Negative for fever and chills.   HENT: Negative for  congestion.    Eyes: Negative for blurred vision and double vision.   Respiratory: Positive for cough and shortness of breath. Negative for wheezing.    Cardiovascular: Negative for chest pain and palpitations.   Gastrointestinal: Negative for heartburn, nausea, vomiting and diarrhea.   Genitourinary: Negative for dysuria, urgency, frequency and hematuria.   Musculoskeletal: Negative for myalgias.   Neurological: Negative for dizziness, sensory change, speech change and focal weakness.   Psychiatric/Behavioral: Negative for depression.       Allergies:No Known Allergies   Medications:Meds have been reviewed   Current  Facility-Administered Medications   Medication Dose Route Frequency Last Rate Last Dose   . acetaminophen (TYLENOL) tablet 650 mg  650 mg Oral Q4H PRN   650 mg at 05/31/15 1455   . albuterol-ipratropium (DUO-NEB) 2.5-0.5(3) mg/3 mL nebulizer 3 mL  3 mL Nebulization Q6H PRN       . amLODIPine (NORVASC) tablet 10 mg  10 mg Oral Daily   10 mg at 06/01/15 1106   . aspirin EC tablet 81 mg  81 mg Oral Daily       . atorvastatin (LIPITOR) tablet 40 mg  40 mg Oral QPM   40 mg at 05/31/15 1707   . benzonatate (TESSALON) capsule 100 mg  100 mg Oral TID PRN   100 mg at 06/01/15 1105   . bumetanide (BUMEX) injection 2 mg  2 mg Intravenous BID   2 mg at 06/01/15 0855   . dextrose (GLUCOSE) 40 % oral gel 15 g of glucose  15 g of glucose Oral PRN        And   . dextrose 50 % bolus 25 mL  25 mL Intravenous PRN        And   . glucagon (rDNA) (GLUCAGEN) injection 1 mg  1 mg Intramuscular PRN       . doxazosin (CARDURA) tablet 4 mg  4 mg Oral QHS   4 mg at 05/31/15 2137   . guaiFENesin (ROBITUSSIN) oral solution 200 mg  200 mg Oral Q4H PRN   200 mg at 05/31/15 1711   . heparin (porcine) injection 5,000 Units  5,000 Units Subcutaneous Q8H [place]   5,000 Units at 06/01/15 0511   . hydrALAZINE (APRESOLINE) tablet 100 mg  100 mg Oral TID   100 mg at 06/01/15 0511   . insulin glargine (LANTUS) injection 10 Units  10 Units Subcutaneous QHS   10 Units at 05/31/15 2159   . insulin lispro (HumaLOG) injection 1-3 Units  1-3 Units Subcutaneous QHS PRN   1 Units at 05/28/15 2232   . insulin lispro (HumaLOG) injection 1-5 Units  1-5 Units Subcutaneous TID AC PRN   1 Units at 05/31/15 1224   . isosorbide mononitrate (IMDUR) 24 hr tablet 120 mg  120 mg Oral Daily   120 mg at 06/01/15 1106   . metOLazone (ZAROXOLYN) tablet 5 mg  5 mg Oral Q12H SCH       . ondansetron (ZOFRAN) injection 4 mg  4 mg Intravenous Once PRN       . pantoprazole (PROTONIX) EC tablet 40 mg  40 mg Oral BID AC   40 mg at 06/01/15 0855   . SITagliptin (JANUVIA) tablet 25 mg  25 mg  Oral Daily   25 mg at 06/01/15 1106        Objective:   Vitals: Vitals reviewed height is 1.575 m (5\' 2" ) and weight is 73.573 kg (162 lb 3.2 oz). His temporal  artery temperature is 97.8 F (36.6 C). His blood pressure is 165/65 and his pulse is 58. His respiration is 18 and oxygen saturation is 97%. Body mass index is 29.66 kg/(m^2).  Filed Vitals:    06/01/15 0150 06/01/15 0452 06/01/15 0900 06/01/15 1104   BP: 146/65 168/67 140/64 165/65   Pulse: 60 58     Temp: 98.1 F (36.7 C) 98.2 F (36.8 C) 97.8 F (36.6 C)    TempSrc: Temporal Artery Temporal Artery Temporal Artery    Resp: 20 20 18     Height:       Weight:       SpO2: 93% 97%     Intake and Output Summary (Last 24 hours) at Date Time     Intake/Output Summary (Last 24 hours) at 06/01/15 1403  Last data filed at 05/31/15 2135   Gross per 24 hour   Intake    500 ml   Output      0 ml   Net    500 ml      Physical Exam: Physical Exam   Constitutional: No distress.   HENT:   Head: Normocephalic and atraumatic.   Mouth/Throat: No oropharyngeal exudate.   Eyes: EOM are normal. Pupils are equal, round, and reactive to light. No scleral icterus.   Neck: Normal range of motion. No thyromegaly present.   Cardiovascular: Normal rate and regular rhythm.    No murmur heard.  Pulmonary/Chest: Effort normal. He has no wheezes. He has rales.   Bilateral rales   Abdominal: Soft. Bowel sounds are normal. He exhibits no distension. There is no tenderness. There is no rebound and no guarding.   Musculoskeletal: Normal range of motion. He exhibits edema. He exhibits no tenderness.   1+ pitting  edema   Neurological: He is alert. No cranial nerve deficit. GCS score is 15.   Non english speaking   Skin: Skin is warm.   Psychiatric: Affect normal.          Results of Labs/imaging   Labs have been reviewed:   Coagulation Profile:        CBC review:     Recent Labs  Lab 05/29/15  0704 05/28/15  0638 05/27/15  0608 05/26/15  1324   WBC 6.54 6.59 5.71 6.43   HGB 9.3* 9.6* 8.8*  9.5*   HEMATOCRIT 28.6* 29.2* 27.2* 28.6*   PLATELETS 265 275 284 303   MCV 91.1 91.5 91.6 91.1   RDW 13 13 13 13    NEUTROPHILS  --   --   --  61   LYMPHOCYTES AUTOMATED  --   --   --  20   EOSINOPHILS AUTOMATED  --   --   --  9   IMMATURE GRANULOCYTE  --   --   --  0   NEUTROPHILS ABSOLUTE  --   --   --  3.94   ABSOLUTE IMMATURE GRANULOCYTE  --   --   --  0.01      Chem Review:    Recent Labs  Lab 06/01/15  0614 05/31/15  0743 05/30/15  0813 05/29/15  0704 05/28/15  0638 05/27/15  0608 05/26/15  1324   SODIUM 134* 134* 137 136 138 138 137   POTASSIUM 3.8 4.1 4.3 4.1 4.3 4.1 3.9   CHLORIDE 100 100 102 102 103 105 103   CO2 24 21* 23 22 21* 25 26   BUN 46.7* 51.6* 51.9* 52.7* 46.5* 42.5* 42.6*  CREATININE 2.3* 2.5* 2.9* 2.9* 2.8* 2.4* 2.7*   GLUCOSE 112* 122* 152* 134* 169* 151* 150*   CALCIUM 8.5 8.8 9.1 8.9 9.2 8.7 8.6   MAGNESIUM  --   --   --   --   --  1.7  --    BILIRUBIN, TOTAL  --   --   --   --   --   --  0.4   AST (SGOT)  --   --   --   --   --   --  33   ALT  --   --   --   --   --   --  26   ALKALINE PHOSPHATASE  --   --   --   --   --   --  110*      Results     Procedure Component Value Units Date/Time    Glucose Whole Blood - POCT [id number]  (Abnormal) Collected:  06/01/15 1155     POCT - Glucose Whole blood 144 (H) mg/dL Updated:  06/01/15 1200    Glucose Whole Blood - POCT [id number]  (Abnormal) Collected:  06/01/15 0825     POCT - Glucose Whole blood 115 (H) mg/dL Updated:  06/01/15 0000000    Basic Metabolic Panel 0000000  (Abnormal) Collected:  06/01/15 0614    Specimen Information:  Blood Updated:  06/01/15 0743     Glucose 112 (H) mg/dL      BUN 46.7 (H) mg/dL      Creatinine 2.3 (H) mg/dL      Calcium 8.5 mg/dL      Sodium 134 (L) mEq/L      Potassium 3.8 mEq/L      Chloride 100 mEq/L      CO2 24 mEq/L      Anion Gap 10.0     GFR [id number] Collected:  06/01/15 0614     EGFR 27.0 Updated:  06/01/15 0743    Glucose Whole Blood - POCT [id number]  (Abnormal) Collected:  05/31/15 2015      POCT - Glucose Whole blood 146 (H) mg/dL Updated:  05/31/15 2020    Blood Culture Aerobic/Anaerobic #2 [id number] Collected:  05/26/15 1323    Specimen Information:  Arm from Blood Updated:  05/31/15 1921    Narrative:      ORDER#: [id number]                                    ORDERED BY: POTLURI, JAGADI  SOURCE: Blood lt hand                                COLLECTED:  05/26/15 13:23  ANTIBIOTICS AT COLL.:                                RECEIVED :  05/26/15 17:17  Culture Blood Aerobic and Anaerobic        FINAL       05/31/15 19:21  05/31/15   No growth after 5 days of incubation.      Blood Culture Aerobic/Anaerobic #1 [id number] Collected:  05/26/15 1323    Specimen Information:  Arm from Blood Updated:  05/31/15 1921    Narrative:      ORDER#: [id number]  ORDERED BY: POTLURI, JAGADI  SOURCE: Blood lt ac                                  COLLECTED:  05/26/15 13:23  ANTIBIOTICS AT COLL.:                                RECEIVED :  05/26/15 17:18  Culture Blood Aerobic and Anaerobic        FINAL       05/31/15 19:21  05/31/15   No growth after 5 days of incubation.      Glucose Whole Blood - POCT [id number]  (Abnormal) Collected:  05/31/15 1618     POCT - Glucose Whole blood 139 (H) mg/dL Updated:  05/31/15 1621         Radiology reports have been reviewed:  Radiology Results (24 Hour)     ** No results found for the last 24 hours. **                Hospitalist   Signed by: Melbourne Abts   06/01/2015 2:03 PM

## 2015-06-02 LAB — GLUCOSE WHOLE BLOOD - POCT
Whole Blood Glucose POCT: 142 mg/dL — ABNORMAL HIGH (ref 70–100)
Whole Blood Glucose POCT: 139 mg/dL — ABNORMAL HIGH (ref 70–100)
Whole Blood Glucose POCT: 123 mg/dL — ABNORMAL HIGH (ref 70–100)
Whole Blood Glucose POCT: 139 mg/dL — ABNORMAL HIGH (ref 70–100)

## 2015-06-02 LAB — BASIC METABOLIC PANEL
Anion Gap: 11 (ref 5.0–15.0)
BUN: 46.8 mg/dL — ABNORMAL HIGH (ref 9.0–28.0)
CO2: 26 mEq/L (ref 22–29)
Calcium: 8.9 mg/dL (ref 7.9–10.2)
Chloride: 98 mEq/L — ABNORMAL LOW (ref 100–111)
Creatinine: 2.4 mg/dL — ABNORMAL HIGH (ref 0.7–1.3)
Glucose: 137 mg/dL — ABNORMAL HIGH (ref 70–100)
Potassium: 4 mEq/L (ref 3.5–5.1)
Sodium: 135 mEq/L — ABNORMAL LOW (ref 136–145)

## 2015-06-02 LAB — GFR: EGFR: 25.7

## 2015-06-02 MED ORDER — POLYETHYLENE GLYCOL 3350 17 G PO PACK
17.0000 g | PACK | Freq: Every day | ORAL | Status: DC
Start: 2015-06-02 — End: 2015-06-03
  Administered 2015-06-02 – 2015-06-03 (×2): 17 g via ORAL
  Filled 2015-06-02 (×2): qty 1

## 2015-06-02 MED ORDER — DOCUSATE SODIUM 100 MG PO CAPS
100.0000 mg | ORAL_CAPSULE | Freq: Two times a day (BID) | ORAL | Status: DC
Start: 2015-06-02 — End: 2015-06-03
  Administered 2015-06-02 – 2015-06-03 (×2): 100 mg via ORAL
  Filled 2015-06-02 (×2): qty 1

## 2015-06-02 MED ORDER — BUMETANIDE 1 MG PO TABS
1.0000 mg | ORAL_TABLET | Freq: Two times a day (BID) | ORAL | Status: DC
Start: 2015-06-03 — End: 2015-06-03
  Administered 2015-06-03 (×2): 1 mg via ORAL
  Filled 2015-06-02 (×2): qty 1

## 2015-06-02 NOTE — Plan of Care (Signed)
Problem: Safety  Goal: Patient will be free from injury during hospitalization  Outcome: Progressing  Fall risk assessment q shift. Call bell, overhead table, and assistive devices within reach. Pt instructed to call for assistance. Bed alarm activated. Bed in lowest position.  Intervention: Assess patient's risk for falls and implement fall prevention plan of care per policy  Fall risk assessment q shift. Call bell, overhead table, and assistive devices within reach. Pt instructed to call for assistance. Bed alarm activated. Bed in lowest position.  Intervention: Provide and maintain safe environment  Fall risk assessment q shift. Call bell, overhead table, and assistive devices within reach. Pt instructed to call for assistance. Bed alarm activated. Bed in lowest position.  Intervention: Use appropriate transfer methods  Assistive devices within reach.  Intervention: Ensure appropriate safety devices are available at the bedside  Fall risk assessment q shift. Call bell, overhead table, and assistive devices within reach. Pt instructed to call for assistance. Bed alarm activated. Bed in lowest position.  Intervention: Include patient/family/caregiver in decisions related to safety  .  Intervention: Hourly rounding.  .  Intervention: Assess for patient's risk for elopement and implement Elopement Risk plan per policy  .      Problem: Pain  Goal: Patient's pain/discomfort is manageable  Outcome: Progressing  Pain assessment q shift and prn. Pain interventions available upon request.  Intervention: Include patient/family/caregiver in decisions related to pain management  .  Intervention: Offer non-pharmocological pain management interventions  .      Problem: Moderate/High Fall Risk Score >5  Goal: Patient will remain free of falls  Outcome: Progressing  Fall risk assessment q shift. Call bell, overhead table, and assistive devices within reach. Pt instructed to call for assistance. Bed alarm activated. Bed in lowest  position.    Problem: Heart Failure  Goal: Stable vital signs and fluid balance  Outcome: Progressing  VS q 4 hours. I/O q shift.  Intervention: Weight on admission and record.  .  Intervention: Monitor intake and output every shift  .  Intervention: Monitor and assess vital signs and oxygen saturation.  VS q 4 hours.  Intervention: Keep 02 Sat > or equal 90% or as ordered. Provide oxygen as ordered.  .  Intervention: Decreased shortness of breath  .    Goal: Mobility/activity is maintained at optimum level for patient  Outcome: Progressing  Intervention: Encourage independent activity per ability.  .  Intervention: Maintain proper body alignment.  .  Intervention: Perform active / passive ROM.  Marland Kitchen  Intervention: Increase activities as tolerated.  .    Goal: Nutritional Intake is Adequate  Outcome: Progressing  Intervention: Allow adequate time for meals  .  Intervention: Encourage/perform oral hygiene as appropriate.  .  Intervention: Consult/Collaborate with Clinical Nutritionist  .  Intervention: Include patient/family in decisions related to nutrition/dietary selection.  Marland Kitchen

## 2015-06-02 NOTE — Progress Notes (Signed)
[facility].  Progress Note    Assessment:   Stage IV CKD   Pulmonary edema and fluid overload   Second degree AV block, Mobitz Type 1, Wenkebach   DM II   HTN   Anemia   Proteinuria   Dyslipidemia    Plan:    -Will continue Bumex 1mg  PO BID  -D/W  and family,Dr.Cherlakola    [place], MD  Office - [phone]  ++++++++++++++++++++++++++++++++++++++++++++++++++++++++++++++  Subjective:  Feels better    Medications:  Scheduled Meds:  Current Facility-Administered Medications   Medication Dose Route Frequency   . amLODIPine  10 mg Oral Daily   . aspirin EC  81 mg Oral Daily   . atorvastatin  40 mg Oral QPM   . [START ON 06/03/2015] bumetanide  1 mg Oral BID   . doxazosin  4 mg Oral QHS   . heparin (porcine)  5,000 Units Subcutaneous Q8H [place]   . hydrALAZINE  100 mg Oral TID   . insulin glargine  10 Units Subcutaneous QHS   . isosorbide mononitrate  120 mg Oral Daily   . pantoprazole  40 mg Oral BID AC   . SITagliptin  25 mg Oral Daily     Continuous Infusions:   PRN Meds:acetaminophen, albuterol-ipratropium, benzonatate, Nursing communication: Adult Hypoglycemia Treatment Algorithm **AND** dextrose **AND** dextrose **AND** glucagon (rDNA), guaiFENesin, insulin lispro, insulin lispro, ondansetron    Objective:  Vital signs in last 24 hours:  Temp:  [97 F (36.1 C)-98.3 F (36.8 C)] 97.4 F (36.3 C)  Heart Rate:  [43-48] 45  Resp Rate:  [18-21] 18  BP: (137-164)/(53-75) 161/65 mmHg  Intake/Output last 24 hours:    Intake/Output Summary (Last 24 hours) at 06/02/15 1224  Last data filed at 06/02/15 0859   Gross per 24 hour   Intake    967 ml   Output   1350 ml   Net   -383 ml     Intake/Output this shift:  I/O this shift:  In: -   Out: 275 [Urine:275]    Physical Exam:   Gen: WD WN NAD   Chest: rales+   Ab: ND NT soft no HSM +BS   Ext: 2+thigh edema    Labs:    Recent Labs  Lab 06/02/15  0546 06/01/15  0614 05/31/15  0743  05/27/15  0608 05/26/15  1324   GLUCOSE 137* 112*  122* More results in Results Review 151* 150*   BUN 46.8* 46.7* 51.6* More results in Results Review 42.5* 42.6*   CREATININE 2.4* 2.3* 2.5* More results in Results Review 2.4* 2.7*   CALCIUM 8.9 8.5 8.8 More results in Results Review 8.7 8.6   SODIUM 135* 134* 134* More results in Results Review 138 137   POTASSIUM 4.0 3.8 4.1 More results in Results Review 4.1 3.9   CHLORIDE 98* 100 100 More results in Results Review 105 103   CO2 26 24 21* More results in Results Review 25 26   ALBUMIN  --   --   --   --   --  2.9*   MAGNESIUM  --   --   --   --  1.7  --    More results in Results Review = values in this interval not displayed.    Recent Labs  Lab 05/29/15  0704 05/28/15  0638 05/27/15  0608   WBC 6.54 6.59 5.71   HGB 9.3* 9.6* 8.8*   HEMATOCRIT 28.6*  29.2* 27.2*   MCV 91.1 91.5 91.6   MCH 29.6 30.1 29.6   MCHC 32.5 32.9 32.4   RDW 13 13 13    MPV 9.7 10.0 9.9   PLATELETS 265 275 284

## 2015-06-02 NOTE — Progress Notes (Signed)
Home health referral is already given to [place] on the day of initial assessment.

## 2015-06-02 NOTE — Progress Notes (Signed)
Nutritional Support Services  Nutrition Screening    Marco Bruce 82 y.o. male                                 MRN: [id number]    Referral Source: Per policy  Reason for Referral: LOS    Orders Placed This Encounter   Procedures   . Diet cardiac Patient preferences: Patient requests bottled water at every meal      PO intake: 100%    Chart review completed.  Labs and meds noted.  No nutrition diagnosis or intervention at this time.  Please consult dietitian if needed.    Bevelyn Buckles, MPH, RDN  Clinical Nutrition Manager  SL X [phone]

## 2015-06-02 NOTE — Progress Notes (Signed)
[facility] HOSPITAL[facility]  Progress Note    Patient Info:   Date/Time: 06/02/2015 / 11:44 AM   Patient Name:Marco Bruce   VN:[id number]    PCP: Harlow Ohms, MD   Admit Date:05/26/2015   Attending Physician:Malikai Gut, Derrek Gu, MD      Assessment and Plan:   1. Pulmonary edema and Volume overload     Improving  , clinically looks much better  As creatinine and BUN slightly trending up, Dr. Kate Sable  recommended to discontinue further IV Bumex.  Patient already received Bumex 2 mg and metolazone this  morning  Changed to Bumex 1 mg by mouth twice a day from tomorrow  And if kidney functions are stable.  Discharged home in a.m.  Today's weight 83 kg ? Accuracy , discussed with nurse to recheck his weight     Patient has severe pulmonary hypertension on his echocardiogram.  2-D echo, ejection fraction 75 percent, severe mitral and tricuspid regurgitation.  Severe left atrial enlargement  Severe pulmonary hypertension   Appreciate cardiology evaluation. Diuresing well   Down from 77.3- 73.5 until 2/26     Records from [facility] His creatinine 2 weeks ago was 2.2, unknown baseline   Patient's pro-calcitonin is negative. We will hold off on antibiotics for now. Patient has not spiked a fever, nor has an elevated white blood cell count.  Continue hydralazine , imdur   With chest x-ray showing pulmonary edema.  No focal consolidation pro-calcitonin negative, no leukocytosis- unlikely to be pneumonia    2. Acute Kidney injury with Chronic kidney disease IV - improving, patient's family states  that the patient had kidney issues in [place].     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 [place]  [facility] on board     4. Elevated troponin-patient was evaluated by cardiology.   Echocardiogram - EF 75%, reviewed  Continue diuresis. May need outpatient stress test.   Continue present meds. Patient is  on 81 mg aspirin.    5.  Normocytic Anemia-patient is not showing any signs of bleeding.  Could be chronic from CKD  Hh stable     6. Hypertension- better Controlled, sbp(140-160)   Patient on hydralazine, IMDUR and amlodipine.Continue present regimen   Moniter closley and adjust medictaions accordingly      7. Right upper shoulder swelling-Venous  duplex of the right upper extremity negative    8.  Scattered wheezing, resolved  place him on when necessary nebulizers, no signs of acute bronchospasm  Continue to monitor     9 Diabetes mellitus-we will continue sliding scale insulin.   A1c 9.6.  Restart Januvia and Lantus    Condition improving   Cardiology and Nephrology input appreciated   Encourage ambulation , discussed with daughter at bedside  Reviewed labs, imaging, vitals       Evaluate need for home oxygen before discharge  Possible discharge in a.m.        DVT Prohylaxis:heparin   Code Status: Full Code   Disposition: home vs snif   Type of Admission:Inpatient   Estimated Length of Stay (including stay in the ER receiving treatment): >48 hrs   Medical Necessity for stay:as above     Subjective:   Chief Complaint:  Overall feeling better.    Ambulating without any difficulty.  Lower extremity edema is improved.  No new complaints  Still has mild dry cough  Chief Complaint   Patient presents with   .  Shortness of Breath      Update of Review of Systems:Review of Systems   Constitutional: Negative for fever and chills.   HENT: Negative for congestion.    Eyes: Negative for blurred vision and double vision.   Respiratory: Positive for cough. Negative for shortness of breath and wheezing.         Dry cough   Cardiovascular: Negative for chest pain and palpitations.   Gastrointestinal: Negative for heartburn, nausea, vomiting and diarrhea.   Genitourinary: Negative for dysuria, urgency, frequency and hematuria.   Musculoskeletal: Negative for myalgias.   Neurological: Negative for dizziness, sensory change, speech change and focal  weakness.   Psychiatric/Behavioral: Negative for depression.       Allergies:No Known Allergies   Medications:Meds have been reviewed   Current Facility-Administered Medications   Medication Dose Route Frequency Last Rate Last Dose   . acetaminophen (TYLENOL) tablet 650 mg  650 mg Oral Q4H PRN   650 mg at 06/02/15 0620   . albuterol-ipratropium (DUO-NEB) 2.5-0.5(3) mg/3 mL nebulizer 3 mL  3 mL Nebulization Q6H PRN       . amLODIPine (NORVASC) tablet 10 mg  10 mg Oral Daily   10 mg at 06/02/15 1008   . aspirin EC tablet 81 mg  81 mg Oral Daily   81 mg at 06/01/15 1719   . atorvastatin (LIPITOR) tablet 40 mg  40 mg Oral QPM   40 mg at 06/01/15 1719   . benzonatate (TESSALON) capsule 100 mg  100 mg Oral TID PRN   100 mg at 06/02/15 0853   . [START ON 06/03/2015] bumetanide (BUMEX) tablet 1 mg  1 mg Oral BID       . dextrose (GLUCOSE) 40 % oral gel 15 g of glucose  15 g of glucose Oral PRN        And   . dextrose 50 % bolus 25 mL  25 mL Intravenous PRN        And   . glucagon (rDNA) (GLUCAGEN) injection 1 mg  1 mg Intramuscular PRN       . doxazosin (CARDURA) tablet 4 mg  4 mg Oral QHS   4 mg at 06/01/15 2207   . guaiFENesin (ROBITUSSIN) oral solution 200 mg  200 mg Oral Q4H PRN   200 mg at 05/31/15 1711   . heparin (porcine) injection 5,000 Units  5,000 Units Subcutaneous Q8H [place]   5,000 Units at 06/02/15 0539   . hydrALAZINE (APRESOLINE) tablet 100 mg  100 mg Oral TID   100 mg at 06/02/15 0538   . insulin glargine (LANTUS) injection 10 Units  10 Units Subcutaneous QHS   10 Units at 06/01/15 2212   . insulin lispro (HumaLOG) injection 1-3 Units  1-3 Units Subcutaneous QHS PRN   1 Units at 06/01/15 2212   . insulin lispro (HumaLOG) injection 1-5 Units  1-5 Units Subcutaneous TID AC PRN   1 Units at 05/31/15 1224   . isosorbide mononitrate (IMDUR) 24 hr tablet 120 mg  120 mg Oral Daily   120 mg at 06/02/15 1008   . ondansetron (ZOFRAN) injection 4 mg  4 mg Intravenous Once PRN       . pantoprazole (PROTONIX) EC tablet 40  mg  40 mg Oral BID AC   40 mg at 06/02/15 0853   . SITagliptin (JANUVIA) tablet 25 mg  25 mg Oral Daily   25 mg at 06/02/15 1008        Objective:  Vitals: Vitals reviewed height is 1.575 m (5\' 2" ) and weight is 83.507 kg (184 lb 1.6 oz). His temporal artery temperature is 97.4 F (36.3 C). His blood pressure is 161/65 and his pulse is 45. His respiration is 18 and oxygen saturation is 97%. Body mass index is 33.66 kg/(m^2).  Filed Vitals:    06/01/15 2100 06/02/15 0032 06/02/15 0536 06/02/15 0900   BP: 149/67 149/65 164/71 161/65   Pulse: 48 43 45    Temp: 98.3 F (36.8 C) 97.9 F (36.6 C) 97.7 F (36.5 C) 97.4 F (36.3 C)   TempSrc: Temporal Artery Temporal Artery Temporal Artery Temporal Artery   Resp: 20 19 18 18    Height:       Weight:   83.507 kg (184 lb 1.6 oz)    SpO2: 96% 95% 96% 97%   Intake and Output Summary (Last 24 hours) at Date Time     Intake/Output Summary (Last 24 hours) at 06/02/15 1144  Last data filed at 06/02/15 0859   Gross per 24 hour   Intake    967 ml   Output   1350 ml   Net   -383 ml      Physical Exam: Physical Exam   Constitutional: No distress.   HENT:   Head: Normocephalic and atraumatic.   Mouth/Throat: No oropharyngeal exudate.   Eyes: EOM are normal. Pupils are equal, round, and reactive to light. No scleral icterus.   Neck: Normal range of motion. No thyromegaly present.   Cardiovascular: Normal rate and regular rhythm.    No murmur heard.  Pulmonary/Chest: Effort normal. He has no wheezes. He has rales.   Minimal rales at at the bases   Abdominal: Soft. Bowel sounds are normal. He exhibits no distension. There is no tenderness. There is no rebound and no guarding.   Musculoskeletal: Normal range of motion. He exhibits edema. He exhibits no tenderness.   1+ pitting  edema   Neurological: He is alert. No cranial nerve deficit. GCS score is 15.   Non english speaking   Skin: Skin is warm.   Psychiatric: Affect normal.          Results of Labs/imaging   Labs have been  reviewed:   Coagulation Profile:        CBC review:     Recent Labs  Lab 05/29/15  0704 05/28/15  0638 05/27/15  0608 05/26/15  1324   WBC 6.54 6.59 5.71 6.43   HGB 9.3* 9.6* 8.8* 9.5*   HEMATOCRIT 28.6* 29.2* 27.2* 28.6*   PLATELETS 265 275 284 303   MCV 91.1 91.5 91.6 91.1   RDW 13 13 13 13    NEUTROPHILS  --   --   --  61   LYMPHOCYTES AUTOMATED  --   --   --  20   EOSINOPHILS AUTOMATED  --   --   --  9   IMMATURE GRANULOCYTE  --   --   --  0   NEUTROPHILS ABSOLUTE  --   --   --  3.94   ABSOLUTE IMMATURE GRANULOCYTE  --   --   --  0.01      Chem Review:    Recent Labs  Lab 06/02/15  0546 06/01/15  0614 05/31/15  0743 05/30/15  0813 05/29/15  0704  05/27/15  0608 05/26/15  1324   SODIUM 135* 134* 134* 137 136 More results in Results Review 138 137   POTASSIUM 4.0 3.8 4.1 4.3  4.1 More results in Results Review 4.1 3.9   CHLORIDE 98* 100 100 102 102 More results in Results Review 105 103   CO2 26 24 21* 23 22 More results in Results Review 25 26   BUN 46.8* 46.7* 51.6* 51.9* 52.7* More results in Results Review 42.5* 42.6*   CREATININE 2.4* 2.3* 2.5* 2.9* 2.9* More results in Results Review 2.4* 2.7*   GLUCOSE 137* 112* 122* 152* 134* More results in Results Review 151* 150*   CALCIUM 8.9 8.5 8.8 9.1 8.9 More results in Results Review 8.7 8.6   MAGNESIUM  --   --   --   --   --   --  1.7  --    BILIRUBIN, TOTAL  --   --   --   --   --   --   --  0.4   AST (SGOT)  --   --   --   --   --   --   --  33   ALT  --   --   --   --   --   --   --  26   ALKALINE PHOSPHATASE  --   --   --   --   --   --   --  110*   More results in Results Review = values in this interval not displayed.   Results     Procedure Component Value Units Date/Time    Glucose Whole Blood - POCT [id number]  (Abnormal) Collected:  06/02/15 0831     POCT - Glucose Whole blood 142 (H) mg/dL Updated:  06/02/15 [id number]    Basic Metabolic Panel [id number]  (Abnormal) Collected:  06/02/15 0546    Specimen Information:  Blood Updated:  06/02/15 0634      Glucose 137 (H) mg/dL      BUN 46.8 (H) mg/dL      Creatinine 2.4 (H) mg/dL      Calcium 8.9 mg/dL      Sodium 135 (L) mEq/L      Potassium 4.0 mEq/L      Chloride 98 (L) mEq/L      CO2 26 mEq/L      Anion Gap 11.0     GFR [id number] Collected:  06/02/15 0546     EGFR 25.7 Updated:  06/02/15 0634    Glucose Whole Blood - POCT [id number]  (Abnormal) Collected:  06/01/15 2027     POCT - Glucose Whole blood 166 (H) mg/dL Updated:  06/01/15 2036    Glucose Whole Blood - POCT [id number]  (Abnormal) Collected:  06/01/15 1658     POCT - Glucose Whole blood 119 (H) mg/dL Updated:  06/01/15 1701    Glucose Whole Blood - POCT [id number]  (Abnormal) Collected:  06/01/15 1155     POCT - Glucose Whole blood 144 (H) mg/dL Updated:  06/01/15 1200         Radiology reports have been reviewed:  Radiology Results (24 Hour)     ** No results found for the last 24 hours. **                Hospitalist   Signed by: Melbourne Abts   06/02/2015 11:44 AM

## 2015-06-02 NOTE — Plan of Care (Signed)
Problem: Safety  Goal: Patient will be free from injury during hospitalization  Outcome: Progressing  Intervention: Assess patient's risk for falls and implement fall prevention plan of care per policy  .  Intervention: Provide and maintain safe environment  Room free of clutter and bed alarm on. Beside table and call bell within reach.   Intervention: Use appropriate transfer methods  .  Intervention: Ensure appropriate safety devices are available at the bedside  .  Intervention: Include patient/family/caregiver in decisions related to safety  .  Intervention: Hourly rounding.  .  Intervention: Assess for patient's risk for elopement and implement Elopement Risk plan per policy  .      Problem: Pain  Goal: Patient's pain/discomfort is manageable  Outcome: Progressing  Intervention: Include patient/family/caregiver in decisions related to pain management  .  Intervention: Offer non-pharmocological pain management interventions  .

## 2015-06-02 NOTE — Progress Notes (Signed)
PULSE OXIMETRY TESTING:   Document patient's oxygen at rest on room air:   "_____% on room air, at rest" (exact number, NO RANGES please)   "_____% on oxygen, at rest, at_____LPM via NC (TO SHOW IMPROVEMENT)     IF 88% OR BELOW on room air, STOP HERE,   IF NOT ambulate patient on room air with exertion and document below:   "_____% on room air, with exertion" (must be 88% or below for pt to qualify for home oxygen)   "_____% on oxygen, with exertion, at_____LPM via NC (how much oxygen does patient need to get sats up above 88%?)     All three tests must be during the same session.   Please document in a PROGRESS NOTE.   Testing to qualify for home oxygen must be no earlier than 48 hours prior to discharge, or it will need to be repeated.     Call x [phone] when completed.

## 2015-06-02 NOTE — Progress Notes (Addendum)
[place] [facility]  PROGRESS NOTE  Suzan Garibaldi     Date Time: 06/02/2015 10:20 AM  Patient Name: [facility]  Medical Record #:  [id number]  Account#:  [id number]  Admission Date:  05/26/2015       Subjective:   Shortness of breath better    Assessment:      Acute diastolic HF with volume overload.   EF 57% Echo 05-27-15: Severe MR, severe TR, severe pulm HTN, normal LV systolic function, mild AS, mild-mod AI.   Dyspnea.   HTN = uncontrolled   DM.   Anemia.   CKD IV.   Mild nonspecific TnI elevation (0.10) in setting of diastolic HF.   HLD.   Former Tobacco use. ? COPD.   Second degree AV block, Mobitz Type 1, Wenkebach    Recommendation:      Will defer BP and fluid management to nephrology. 10 KG weight gain from 2/25-will ask for reweigh. <540ml negative   Monitor accurate intake and output.   Continue hydralazine, Imdur, amlodipine and Bumex for hypertension. Avoid ARB and ACE due to renal dysfunction. Continue to monitor BP closely.   Closely monitor heart rhythm for any progression of AV block. No betablocker due to baseline bradycardia.and  Wenckebach.   Ambulate                    Consider clonidine for BP.  Otherwise, overall much improved.      Medications:      Scheduled Meds:      amLODIPine 10 mg Oral Daily   aspirin EC 81 mg Oral Daily   atorvastatin 40 mg Oral QPM   bumetanide 2 mg Intravenous BID   doxazosin 4 mg Oral QHS   heparin (porcine) 5,000 Units Subcutaneous Q8H [place]   hydrALAZINE 100 mg Oral TID   insulin glargine 10 Units Subcutaneous QHS   isosorbide mononitrate 120 mg Oral Daily   metOLazone 5 mg Oral Q12H [place]   pantoprazole 40 mg Oral BID AC   SITagliptin 25 mg Oral Daily       Continuous Infusions:            Physical Exam:     Filed Vitals:    06/02/15 0536   BP: 164/71   Pulse: 45   Temp: 97.7 F (36.5 C)   Resp: 18   SpO2: 96%     Telemetry: Frequent second degree AV block. Mobitz Type 1 Wenckebach with HR 40's during night      Intake and Output Summary (Last 24 hours) at  Date Time    Intake/Output Summary (Last 24 hours) at 06/02/15 1020  Last data filed at 06/02/15 0859   Gross per 24 hour   Intake    967 ml   Output   1350 ml   Net   -383 ml     Pt ambulating and unable to assess-no acute distress    Labs:      Recent Labs  Lab 05/27/15  0031 05/26/15  1913 05/26/15  1323   TROPONIN I 0.08 0.08 0.10*               Recent Labs  Lab 05/26/15  1324   BILIRUBIN, TOTAL 0.4   PROTEIN, TOTAL 6.3   ALBUMIN 2.9*   ALT 26   AST (SGOT) 33       Recent Labs  Lab 05/27/15  0608   MAGNESIUM 1.7           Recent  Labs  Lab 05/29/15  0704 05/28/15  0638 05/27/15  0608   WBC 6.54 6.59 5.71   HGB 9.3* 9.6* 8.8*   HEMATOCRIT 28.6* 29.2* 27.2*   PLATELETS 265 275 284       Recent Labs  Lab 06/02/15  0546 06/01/15  0614 05/31/15  0743   SODIUM 135* 134* 134*   POTASSIUM 4.0 3.8 4.1   CHLORIDE 98* 100 100   CO2 26 24 21*   BUN 46.8* 46.7* 51.6*   CREATININE 2.4* 2.3* 2.5*   EGFR 25.7 27.0 24.6   GLUCOSE 137* 112* 122*   CALCIUM 8.9 8.5 8.8   .  Lab Results   Component Value Date    BNP 643.4* 05/26/2015        Diagnostic/Imaging:   Radiological Procedure reviewed.                     Signed by: Acquanetta Sit, NP      Patient seen and examined.  Agree with NP/PA note, exam, and plan as above with changes in italics.    Ruta Capece A. Jessy Oto, MD, [place]  NP [place] (8am-5pm)  MD Spectralink [phone] (8am-5pm)  After hours, non urgent consult line [phone]  After Hours, urgent consults [phone]

## 2015-06-03 ENCOUNTER — Ambulatory Visit (INDEPENDENT_AMBULATORY_CARE_PROVIDER_SITE_OTHER): Payer: Medicare Other | Admitting: "Endocrinology

## 2015-06-03 LAB — GLUCOSE WHOLE BLOOD - POCT
Whole Blood Glucose POCT: 92 mg/dL (ref 70–100)
Whole Blood Glucose POCT: 147 mg/dL — ABNORMAL HIGH (ref 70–100)

## 2015-06-03 LAB — BASIC METABOLIC PANEL
Anion Gap: 10 (ref 5.0–15.0)
BUN: 47.6 mg/dL — ABNORMAL HIGH (ref 9.0–28.0)
CO2: 25 mEq/L (ref 22–29)
Calcium: 8.6 mg/dL (ref 7.9–10.2)
Chloride: 96 mEq/L — ABNORMAL LOW (ref 100–111)
Creatinine: 2.3 mg/dL — ABNORMAL HIGH (ref 0.7–1.3)
Glucose: 113 mg/dL — ABNORMAL HIGH (ref 70–100)
Potassium: 4.1 mEq/L (ref 3.5–5.1)
Sodium: 131 mEq/L — ABNORMAL LOW (ref 136–145)

## 2015-06-03 LAB — ECG 12-LEAD
Atrial Rate: 63 {beats}/min
P Axis: 34 degrees
Q-T Interval: 520 ms
QRS Duration: 110 ms
QTC Calculation (Bezet): 439 ms
R Axis: -55 degrees
T Axis: 7 degrees
Ventricular Rate: 43 {beats}/min

## 2015-06-03 LAB — GFR: EGFR: 27

## 2015-06-03 MED ORDER — INSULIN GLARGINE 100 UNIT/ML SC SOLN
8.0000 [IU] | Freq: Every evening | SUBCUTANEOUS | Status: DC
Start: 2015-06-03 — End: 2015-06-16

## 2015-06-03 MED ORDER — BUMETANIDE 1 MG PO TABS
1.0000 mg | ORAL_TABLET | Freq: Two times a day (BID) | ORAL | Status: DC
Start: 2015-06-03 — End: 2015-06-09

## 2015-06-03 MED ORDER — GLIPIZIDE 5 MG PO TABS
2.5000 mg | ORAL_TABLET | Freq: Every morning | ORAL | Status: DC
Start: 2015-06-03 — End: 2015-06-03

## 2015-06-03 MED ORDER — BENZONATATE 100 MG PO CAPS
100.0000 mg | ORAL_CAPSULE | Freq: Three times a day (TID) | ORAL | Status: DC | PRN
Start: 2015-06-03 — End: 2015-06-16

## 2015-06-03 NOTE — Plan of Care (Signed)
Problem: Safety  Goal: Patient will be free from injury during hospitalization  Outcome: Progressing  Fall risk assessment q shift. Call bell, overhead table, and assistive devices within reach. Pt instructed to call for assistance. Bed alarm activated. Bed in lowest position.  Intervention: Assess patient's risk for falls and implement fall prevention plan of care per policy  Fall risk assessment q shift. Call bell, overhead table, and assistive devices within reach. Pt instructed to call for assistance. Bed alarm activated. Bed in lowest position.  Intervention: Provide and maintain safe environment  Fall risk assessment q shift. Call bell, overhead table, and assistive devices within reach. Pt instructed to call for assistance. Bed alarm activated. Bed in lowest position.  Intervention: Use appropriate transfer methods  Assistive devices within reach.  Intervention: Ensure appropriate safety devices are available at the bedside  Fall risk assessment q shift. Call bell, overhead table, and assistive devices within reach. Pt instructed to call for assistance. Bed alarm activated. Bed in lowest position.  Intervention: Include patient/family/caregiver in decisions related to safety  .  Intervention: Hourly rounding.  .  Intervention: Assess for patient's risk for elopement and implement Elopement Risk plan per policy  .      Problem: Pain  Goal: Patient's pain/discomfort is manageable  Outcome: Progressing  Pain assessment q shift and prn. Pain interventions available upon request.  Intervention: Include patient/family/caregiver in decisions related to pain management  .  Intervention: Offer non-pharmocological pain management interventions  .      Problem: Moderate/High Fall Risk Score >5  Goal: Patient will remain free of falls  Outcome: Progressing  Fall risk assessment q shift. Call bell, overhead table, and assistive devices within reach. Pt instructed to call for assistance. Bed alarm activated. Bed in lowest  position.    Problem: Heart Failure  Goal: Stable vital signs and fluid balance  Outcome: Progressing  VS q 4 hours. I/O q shift.  Intervention: Weight on admission and record.  .  Intervention: Monitor intake and output every shift  .  Intervention: Monitor and assess vital signs and oxygen saturation.  VS q 4 hours.  Intervention: Keep 02 Sat > or equal 90% or as ordered. Provide oxygen as ordered.  .  Intervention: Decreased shortness of breath  .    Goal: Mobility/activity is maintained at optimum level for patient  Outcome: Progressing  Intervention: Encourage independent activity per ability.  .  Intervention: Maintain proper body alignment.  .  Intervention: Perform active / passive ROM.  Marland Kitchen  Intervention: Increase activities as tolerated.  .    Goal: Nutritional Intake is Adequate  Outcome: Progressing  Intervention: Allow adequate time for meals  .  Intervention: Encourage/perform oral hygiene as appropriate.  .  Intervention: Consult/Collaborate with Clinical Nutritionist  .  Intervention: Include patient/family in decisions related to nutrition/dietary selection.  Marland Kitchen

## 2015-06-03 NOTE — Progress Notes (Addendum)
Spoke with daughter Diyan Jarmin. . Daughter declined [place]. Notified [place].

## 2015-06-03 NOTE — Progress Notes (Signed)
[place] [facility]  PROGRESS NOTE  Suzan Garibaldi     Date Time: 06/03/2015 9:53 AM  Patient Name: [facility]  Medical Record #:  [id number]  Account#:  [id number]  Admission Date:  05/26/2015       Subjective:   Shortness of breath better    Assessment:      Acute diastolic HF with volume overload.   EF 57% Echo 05-27-15: Severe MR, severe TR, severe pulm HTN, normal LV systolic function, mild AS, mild-mod AI.   Dyspnea.   HTN = uncontrolled   DM.   Anemia.   CKD IV.   Mild nonspecific TnI elevation (0.10) in setting of diastolic HF.   HLD.   Former Tobacco use. ? COPD.   Second degree AV block, Mobitz Type 1, Wenkebach    Recommendation:      Closely monitor heart rhythm for any progression of AV block. No av nodal blockers due to baseline bradycardia.and  Wenckebach.   Probable d/c today. F/u 3-5 days                        Medications:      Scheduled Meds:        amLODIPine 10 mg Oral Daily   aspirin EC 81 mg Oral Daily   atorvastatin 40 mg Oral QPM   bumetanide 1 mg Oral BID   docusate sodium 100 mg Oral BID   doxazosin 4 mg Oral QHS   heparin (porcine) 5,000 Units Subcutaneous Q8H [place]   hydrALAZINE 100 mg Oral TID   insulin glargine 10 Units Subcutaneous QHS   isosorbide mononitrate 120 mg Oral Daily   pantoprazole 40 mg Oral BID AC   polyethylene glycol 17 g Oral Daily   SITagliptin 25 mg Oral Daily       Continuous Infusions:            Physical Exam:     Filed Vitals:    06/03/15 0935   BP: 156/64   Pulse: 51   Temp: 97.8 F (36.6 C)   Resp: 16   SpO2: 95%     Telemetry: Frequent second degree AV block. Mobitz Type 1 Wenckebach with HR 40's during night      Intake and Output Summary (Last 24 hours) at Date Time    Intake/Output Summary (Last 24 hours) at 06/03/15 0953  Last data filed at 06/03/15 0511   Gross per 24 hour   Intake    300 ml   Output   1800 ml   Net  -1500 ml     Neck:  No jvp  Lungs: clear, normal effort  Cv: rrr, no m/r/g  Abd: soft nt  Extrem: scant left ankle edema    Labs:                               Recent Labs  Lab 05/29/15  0704 05/28/15  0638   WBC 6.54 6.59   HGB 9.3* 9.6*   HEMATOCRIT 28.6* 29.2*   PLATELETS 265 275       Recent Labs  Lab 06/03/15  0518 06/02/15  0546 06/01/15  0614   SODIUM 131* 135* 134*   POTASSIUM 4.1 4.0 3.8   CHLORIDE 96* 98* 100   CO2 25 26 24    BUN 47.6* 46.8* 46.7*   CREATININE 2.3* 2.4* 2.3*   EGFR 27.0 25.7 27.0   GLUCOSE 113* 137* 112*  CALCIUM 8.6 8.9 8.5   .  Lab Results   Component Value Date    BNP 643.4* 05/26/2015             Signed by: Velora Mediate, MD          [facility]  NP [place] (8am-5pm)  MD Spectralink [phone] (8am-5pm)  After hours, non urgent consult line [phone]  After Hours, urgent consults [phone]

## 2015-06-03 NOTE — Discharge Instructions (Signed)
Ask3Teach3 Program    Education about New Medications and their Side effects    Dear Marco Bruce,    Its been a pleasure taking care of you during your hospitalization here at [facility]. We have initiated a new program to educate our patients and/or their family members or designated personnel about the new medications started by your physicians and their indications along with the possible side effects. Multiple studies have shown that patients started on new medications are often unaware of the names of the medication along with the indications and their side effects which leads to decreased compliance with the medications.    During our conversation today on 06/03/2015  2:14 PM I have explained to you the name of the new medication and the indication along with some possible common side effects. Listed below are some of the new medications started during this hospitalization.     Please call the Nurse if you have any side effects while in hospital.     Please call [phone] if you have any life threatening symptoms after you are discharged from the hospital.    Please inform your Primary care physician for common side effects which are not life threatening after discharge.    Medication Name: Bumetanide(Bumex)   This Medication is used for:   Congestive Heart Failure   Excess Fluid  Common Side Effects are:   Dizziness/Low Blood Pressure(especially when standing up)    Frequent Urination   Low Potassium Levels   Muscle Cramps   Dehydration  A note from your nurse:  Call your nurse immediately if you notice itching, hives, swelling or trouble breathing     Thank you for your time.    Lesly Rubenstein, RN  06/03/2015  2:14 PM  [facility]  [address]  [place], [place]  [address]

## 2015-06-03 NOTE — Progress Notes (Signed)
Pt ready for [place]. Daughter at bedside. [place] instructions given and understanding voiced. IV Tetherow'd. Monitor [place]. VSS. Hemostasis obtained. Prescriptions called in to [facility]. Pt belongings and paperwork in hand. Pt transported via w/c to private vehicle to private residence.

## 2015-06-03 NOTE — Progress Notes (Signed)
PULSE OXIMETRY TESTING:   Document patient's oxygen at rest on room air:   "__94___% on room air, at rest" (exact number, NO RANGES please)   "_____% on oxygen, at rest, at_____LPM via NC (TO SHOW IMPROVEMENT)     IF 88% OR BELOW on room air, STOP HERE,   IF NOT ambulate patient on room air with exertion and document below:   "__94___% on room air, with exertion" (must be 88% or below for pt to qualify for home oxygen)   "_____% on oxygen, with exertion, at_____LPM via NC (how much oxygen does patient need to get sats up above 88%?)     All three tests must be during the same session.   Please document in a PROGRESS NOTE.   Testing to qualify for home oxygen must be no earlier than 48 hours prior to discharge, or it will need to be repeated.     Call [phone] when completed.

## 2015-06-04 ENCOUNTER — Ambulatory Visit (INDEPENDENT_AMBULATORY_CARE_PROVIDER_SITE_OTHER): Payer: Medicare Other | Admitting: "Endocrinology

## 2015-06-04 ENCOUNTER — Ambulatory Visit (INDEPENDENT_AMBULATORY_CARE_PROVIDER_SITE_OTHER): Payer: Medicare Other

## 2015-06-04 ENCOUNTER — Encounter (INDEPENDENT_AMBULATORY_CARE_PROVIDER_SITE_OTHER): Payer: Self-pay | Admitting: "Endocrinology

## 2015-06-04 VITALS — BP 151/52 | HR 55 | Temp 98.0°F | Resp 18 | Wt 163.0 lb

## 2015-06-04 DIAGNOSIS — M25562 Pain in left knee: Secondary | ICD-10-CM

## 2015-06-04 DIAGNOSIS — M25561 Pain in right knee: Secondary | ICD-10-CM

## 2015-06-04 DIAGNOSIS — E118 Type 2 diabetes mellitus with unspecified complications: Secondary | ICD-10-CM

## 2015-06-04 DIAGNOSIS — E871 Hypo-osmolality and hyponatremia: Secondary | ICD-10-CM

## 2015-06-04 LAB — POCT GLUCOSE: Whole Blood Glucose POCT: 176 mg/dL — AB (ref 70–100)

## 2015-06-04 MED ORDER — LIDOCAINE 4 % EX PTCH
2.0000 | MEDICATED_PATCH | Freq: Every day | CUTANEOUS | Status: DC
Start: 2015-06-04 — End: 2015-07-10

## 2015-06-04 NOTE — Patient Instructions (Signed)
1) Continue to take all your medications as prescribed.     2) Diabetes Melitus  Check your blood sugar in the morning, before breakfast, and 2 hours after dinner.   Record your blood sugar readings in a note book and return with note book at next visit.    3) Hypoglycemia (low blood sugar) and hyperglycemia (high blood sugar) instructions  Symptoms of Hypoglycemia (low blood sugars)                      Feeling shaky, weak or hungry                      Dizziness or lightheadedness            Numbness and tingling of the lips                  Sweating                                                        Headaches                                                    Problems with vision    What to do if you experience hypoglycemia (low blood sugars)  Eat or drink something with 15 grams of carbs  1/2 cup of fruit juice e.g orange juice  1/2 cup of soft drink (avoid diet drinks)  Glucose tablets   1 tablespoon of honey    Symptoms of Hyperglycemia  (high blood sugars)   Feeling tired or fatigued  Increased thirst  Frequent urination  Blurred vision  Headaches    4) You were seen today at [facility]     Managing your Heart Failure    1. When you get up, empty your bladder before you eat or drink  2. Before you get dressed, get on the scale  3. Record your weight on this paper; don't depend on your memory  4. Try to weigh yourself at about the same time every day   5. Bring your weight calendar with you to your doctor visits      If your weight goes up by 2 pounds in 1 day or 5 pounds in a week, call the clinic at  [phone]    Date Day of Week Weight (lbs) Blood Pressure      /      /      /      /      /      /      /      /      /      /      /      /      /      /      /     Heart Failure "Zones"    Green Zone - All Clear  Action    No new or worsening shortness of breath   No new or worsening swelling of your hands, abdomen, legs or ankles  No Weight gain   No chest pain or tightness   No  decrease in your ability to maintain your activity level    Continue taking your medications as ordered   Continue your daily weights   Follow a low salt diet   Keep all physician appointments and follow-ups   Yellow Zone - Caution Action    Weight gain of 2 pounds in a day   Increased swelling of your hands, abdomen, legs, or ankles   Increased in shortness of breath with activity   Increase in the number of pillows needed to sleep at night   New or more frequent chest pain or tightness   New onset of dizziness or lightheadedness after standing up    Call your physician's office  Do not wait until you are RED to call.  Call for even one symptom!   Red Zone - "Emergency" Action    Unrelieved shortness of breath or shortness of breath at rest   Unrelieved chest pain   Wheezing or chest tightness at rest   Need to sit in a chair to sleep   Weight gain of more than 3 pounds in a day or 5 pounds in a week   A fall related to dizziness or lightheadedness CALL your physician's office right away!  Call 911 if you:   Faint or pass out   Become extremely short of breath or are unable to talk due to breathlessness   Have severe chest pain     Follow up with Emeline General, MD In 1 week. - Please call today to schedule an appointment      Specialty: Cardiology    Contact information:    [address]   [address]   [place] [address]   [phone]           Follow up with Queen Slough, MD.  Please call today to schedule an appointment     Specialties: Nephrology, Internal Medicine    Contact information:    [address]   [place] [address]   [phone]

## 2015-06-04 NOTE — Progress Notes (Addendum)
Nurse case coordinator introduce self to patient and pt's son; Case coordinating services from [facility] to both. Patient and pt's son verb und in role.    Dispo: pt's daughter provided with [facility] and [facility] as resources to help patient schedule appt with new PCP.  Medication discount card.

## 2015-06-04 NOTE — Progress Notes (Signed)
History of Present Illness:     This patient is a 82 y.o. male, PMH significant for T2DM, CKD and HTN, here for his initial [facility] visit after his recent hospitalization at [facility], Feb 20 to 28, 2017 for acute on chronic renal failure, acute diastolic heart failure with volume overload, and severe pulmonary hypertension.  Cardiology and Nephrology were consulted, he was managed on IV Bumex, subsequently transitioned to oral Bumex once his respiratory sx and volume overload improved.  Echo showed an EF of 75 %, severe MR, TR, and mild AS.  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      Patient presents to clinic today with his daughter, who is interpreting per patient preference.     1) Discharged from the hospital yesterday afternoon, doing well so far at home, mobilizing well at home.   2) Reports significant improvement in cough, no SOB at rest, denies DOE, walked from the parking to our clinic which is approximately 100 ft without dyspnea.    3) Denies chest pain or palpitation. Denies lightheadedness.    4) Was dizzy this morning due to "low blood sugar," resolved after food.  Currently denies any dizziness or lightheadedness.    5) Reports normal B/B function.   6) Appetite at baseline, aware of fluid and salt  Restriction.   7) Reports bilateral knee pain as well as bilateral elbow pain since he was in the hospital.  Does not note of any joint swelling, erythema or warmth.       Review of Systems:     Review of Systems   Constitutional: Negative for fever, chills, malaise/fatigue and diaphoresis.   HENT: Negative for congestion.    Respiratory: Positive for cough. Negative for hemoptysis, sputum production, shortness of breath and wheezing.    Cardiovascular: Negative for chest pain, palpitations, orthopnea, claudication and PND.   Gastrointestinal: Negative for nausea, vomiting, abdominal pain, diarrhea  and constipation.   Genitourinary: Negative for dysuria, urgency and frequency.   Musculoskeletal: Positive for joint pain (bilateral knee and elbow pain ). Negative for myalgias.   Neurological: Negative for dizziness, tingling, tremors, sensory change, speech change, focal weakness, loss of consciousness, weakness and headaches.   Endo/Heme/Allergies: Does not bruise/bleed easily.       Physical Exam:     Filed Vitals:    06/04/15 1036   BP: 151/52   Pulse: 55   Temp: 98 F (36.7 C)   Resp: 18   SpO2: 94%       Wt Readings from Last 3 Encounters:   06/04/15 73.936 kg (163 lb)   06/03/15 82.192 kg (181 lb 3.2 oz)       General: awake, alert, oriented x 3  HEENT: perrla, eomi, sclera anicteric,oropharynx clear without lesions, mucous membranes moist  Neck: supple, no lymphadenopathy, no thyromegaly, no JVD, no carotid bruits  Cardiovascular: S1, S2, +ve systolic murmur, no  rubs, or gallops, HR 55, irregularly irregular rhythm   Lungs: clear to auscultation bilaterally, without wheezing, rhonchi, or rales  Abdomen: soft, non tender, normal bowel sounds  Extremities: no clubbing, cyanosis, or edema except trace edema to BLE, no erythema, warmth to bilateral knees and bilateral elbows  Neuro: cranial nerves grossly intact, strength 5/5 in upper and lower extremities, sensation intact  Skin: no rashes or lesions noted    Diagnostics:     Lab Results   Component Value Date  WBC 6.54 05/29/2015    HGB 9.3* 05/29/2015    HCT 28.6* 05/29/2015    PLT 265 05/29/2015    CHOL 113* 05/31/2006    TRIG 125 05/31/2006    LDL 45* 05/31/2006    ALT 26 05/26/2015    AST 33 05/26/2015    NA 131* 06/03/2015    K 4.1 06/03/2015    CL 96* 06/03/2015    CREAT 2.3* 06/03/2015    BUN 47.6* 06/03/2015    CO2 25 06/03/2015    PSA 1.0 01/12/2006    GLU 113* 06/03/2015    HGBA1C 9.6* 05/28/2015       US Renal Kidney    05/28/2015  Findings compatible with chronic medical renal disease. Mertha Baars, MD 05/28/2015 2:45 PM              Assessment:     Patient Active Problem List   Diagnosis   . SOB (shortness of breath)   . ARF (acute renal failure)   . Pulmonary edema   . Acute pulmonary edema   . Acute renal failure, unspecified acute renal failure type   . CKD (chronic kidney disease), unspecified stage   . Acute diastolic heart failure       Plan:     Marco Bruce was seen today for congestive heart failure and diabetes.    Diagnoses and all orders for this visit:    Type 2 diabetes mellitus with complication, unspecified long term insulin use status  -     POCT Glucose; Standing  -     POCT Glucose    Hyponatremia  -     Basic Metabolic Panel    Pain in both knees, unspecified chronicity  -     Lidocaine 4 % Patch; Apply 2 patches topically daily.    1) CHF - previous respiratory sx resolved - weight stable - continue Bumex 1 mg BID; not on ACE/ARB due to recent worsening renal function; not on BB due to bradycardia, AV block; f/u with Cardiology as planned; HF education provided at length; HF commitment to care discussed and given to patient's daughter.     2) T2DM - POCT BP at goal - continue current dose of Lantus and Januvia; monitor BS twice daily; call ITS for BS lower than 70 and above 200s.  Glucometer given today.     3) HTN - POCT BP reasonable for patient's age - continue same dose Amlodipine, Hydralazine, Imdur    4) HLD - continue Atorvastatin    5) Continue Protonix for GI protection     6) Joint pain - apply Lidocaine 4 % topical patch to affected areas daily as needed; no oral pain med for now due patient's poor kidney function and polypharmacy;  Joint pain most likely due to recent prolonged bed rest, recommended stretching and mobilization at home. Call if sx persist or worsens.     FOLLOW-UP PLAN:2 weeks     PROGRESS ON ESTABLISHING A MEDICAL HOME:[facility], [facility]

## 2015-06-05 ENCOUNTER — Telehealth (INDEPENDENT_AMBULATORY_CARE_PROVIDER_SITE_OTHER): Payer: Self-pay

## 2015-06-05 ENCOUNTER — Telehealth: Payer: Self-pay

## 2015-06-05 LAB — BASIC METABOLIC PANEL
BUN / Creatinine Ratio: 20 (ref 10–22)
BUN: 44 mg/dL — ABNORMAL HIGH (ref 8–27)
CO2: 24 mmol/L (ref 18–29)
Calcium: 8.8 mg/dL (ref 8.6–10.2)
Chloride: 90 mmol/L — ABNORMAL LOW (ref 96–106)
Creatinine: 2.16 mg/dL — ABNORMAL HIGH (ref 0.76–1.27)
EGFR: 27 mL/min/{1.73_m2} — ABNORMAL LOW (ref >59)
EGFR: 31 mL/min/{1.73_m2} — ABNORMAL LOW (ref >59)
Glucose: 244 mg/dL — ABNORMAL HIGH (ref 65–99)
Potassium: 4.4 mmol/L (ref 3.5–5.2)
Sodium: 128 mmol/L — ABNORMAL LOW (ref 134–144)

## 2015-06-05 NOTE — Telephone Encounter (Signed)
TC from pt's daughter. States pt's cough is still very bad and tessalon pearls medication is not helping him. Pt's daughter states cough is very dry. Discuss case with Dossie Der, NP  Pt's daughter recommended to continue with Tessalon Pearls. Additionally, have patient take Claritin 10 mg tab daily. Pt 's daughter informed it would take a couple of days to see the effect from medication.  Encouraged pt to drink warm fluids, but pt's daughter states he does not want to drink much. She sometimes has to strongly encourage him.   Encouraged to turn on humidifier and get from pharmacy sugar free cough drops.   Pt's daughter stats she is weighting pt daily and there has been less than a pound difference from one day to the next. Encouraged pt's daughter to continue to monitor patient and if s/sx worsen, to please take pt to urgent care.  Pt's daughter verb und and agreed with POC.

## 2015-06-05 NOTE — Telephone Encounter (Addendum)
Transitional Care Management     Writer called patient at the following number: [phone], for initial CHF education call, left VM with contact information.  Writer also called daughter Aryn Haffey on mobile number: [phone], left VM with contact information.  [place] interpreter ID [phone] used on call.     Ileana Roup, RN, BSN   Case Manager   [place] [facility]itional Care Management   [phone]      Addendum:  Konrad Dolores will follow up again next week.     Ileana Roup, RN, BSN   Case Public librarian Transitional Care Management   [phone]

## 2015-06-06 NOTE — Discharge Summary (Signed)
[facility] HOSPITALIST    Summary     Patient Info:   Date Time: 06/06/2015  4:05 PM   Patient Name:Marco Bruce   [id number]    PCP: Stacie Glaze, NP   Admit Date:05/26/2015   Attending Physician:No att. providers found      Hospital Course:   Please see H&P for complete details of HPI and ROS. The patient was admitted to [facility] and has been diagnosed with the following conditions and has been taken care as mentioned below.    Patient is a 82 year old male admitted to the hospital with complaints of shortness of breath and cough.  He was recently admitted in [place] for similar condition where he had swelling and treated with diuretics and had a prolonged hospital stay and discharged home.    --- Acute on chronic diastolic congestive heart failure.  Echo showed 22 percent ejection fraction with severe MR, severe TR, severe pulmonary hypertension and normal systolic function.  Patient has been on Lasix prior to the admission.  Has been treated with IV diuretics  Will continue with Bumex 1 mg twice a day and outpatient follow-up    --- Second-degree AV block, Mobitz type I Wenckebach has been stable with no symptoms.  Cardiology recommended no AV blocking agents.    --- History of Hypertension.  Continue with hydralazine and Norvasc, Imdur     --- History of diabetes.  Continue with Januvia 50 mg.  Patient is also on Lantus at home prior to the admission.  Advised her to decrease the dose of Lantus to 8 units.    --- Acute kidney injury on chronic kidney disease stage IV.  Creatinine at baseline    --- Elevated troponin, most likely related to demand ischemia from congestive heart failure    --- History of hyperlipidemia.  Continue Lipitor    --- History of prior tobacco use    --- History of gastroesophageal reflux disease.  Continue with Protonix    Discussed with the patient's daughter and the grandson at length at bedside about the significant comorbid conditions and need for  close follow-up.  Explained to them about daily weight check and fluid restriction and low-salt diet.       Hospital Problems:  Principal Problem:    SOB (shortness of breath)  Active Problems:    ARF (acute renal failure)    Pulmonary edema    Acute pulmonary edema    Acute renal failure, unspecified acute renal failure type    CKD (chronic kidney disease), unspecified stage    Acute diastolic heart failure     Admission Date:05/26/2015   Discharge Date: 06/03/15    Disposition: home   Condition at Discharge and Prognosis: Stable    Type of Admission: Inpatient   Medical Necessity for stay: Congestive heart failure    Code Status: Full code        Clinical Presentation:   History of Presenting Illness: Patient feeling significantly better.  Wants to go home.  Shortness of breath has improved.  No chest pain, no abdominal pain, no nausea, no vomiting.  Tolerating food.     Chief Complaint:   Chief Complaint   Patient presents with   . Shortness of Breath      Vitals: Vitals reviewed height is 1.575 m (5\' 2" ) and weight is 82.192 kg (181 lb 3.2 oz). His temporal artery temperature is 97.8 F (36.6 C). His blood pressure is 156/64 and his pulse is 51.  His respiration is 16 and oxygen saturation is 95%. Body mass index is 33.13 kg/(m^2).  Filed Vitals:    06/02/15 2125 06/03/15 0100 06/03/15 0508 06/03/15 0935   BP: 137/57 147/59 165/62 156/64   Pulse: 47 48 48 51   Temp: 98.2 F (36.8 C) 97.6 F (36.4 C) 98.6 F (37 C) 97.8 F (36.6 C)   TempSrc: Temporal Artery Temporal Artery Temporal Artery Temporal Artery   Resp: 20 20 20 16    Height:       Weight:   82.192 kg (181 lb 3.2 oz)    SpO2: 94% 97% 95% 95%     Intake and Output Summary (Last 24 hours) at Date Time No intake or output data in the 24 hours ending 06/06/15 1605   Physical Exam:   Comfortable, not in acute respiratory distress   Chest bilateral clear breath sounds.  No wheezes, no refills   CV S1, S2 present, regular, positive murmur   Abdomen soft,  nondistended, nontender   Extremities positive for trace edema bilateral ankles   Neurologic awake, alert, oriented 3         Discharge Diagnosis and Instructions:   Pending Labs:  Unresulted Labs     None         Consultants:Plan None   Discharge Medications:      Discharge Medication List      Taking          amLODIPine 10 MG tablet   Dose:  10 mg   Commonly known as:  NORVASC   Take 10 mg by mouth daily.   Notes to Patient:    LAST DOSE GIVEN: Tuesday February 28th    NEXT DOSE DUE: Wednesday March 1st       atorvastatin 40 MG tablet   Dose:  40 mg   Commonly known as:  LIPITOR   Take 40 mg by mouth every evening.   Notes to Patient:    LAST DOSE GIVEN: Monday February 27th    NEXT DOSE DUE: Tuesday February 28th in the evening       benzonatate 100 MG capsule   Dose:  100 mg   Commonly known as:  TESSALON   Take 1 capsule (100 mg total) by mouth 3 (three) times daily as needed for Cough.       bumetanide 1 MG tablet   Dose:  1 mg   Commonly known as:  BUMEX   Take 1 tablet (1 mg total) by mouth 2 (two) times daily.   Notes to Patient:    LAST DOSE GIVEN: Tuesday February 28th at 8:14 AM and 1:34 PM    NEXT DOSE DUE: Wednesday March 1st       doxazosin 4 MG tablet   Dose:  4 mg   Commonly known as:  CARDURA   Take 4 mg by mouth nightly.   Notes to Patient:    LAST DOSE GIVEN: Monday February 27th    NEXT DOSE DUE: Tuesday February 28th at bedtime       hydrALAZINE 100 MG tablet   Dose:  100 mg   Commonly known as:  APRESOLINE   Take 100 mg by mouth 3 (three) times daily.   Notes to Patient:    LAST DOSE GIVEN: Tuesday February 28th at 5:08 AM and 1:34 PM    NEXT DOSE DUE: Tuesday February 28th at bedtime       insulin glargine 100 UNIT/ML injection   Dose:  8 Units  What changed:  how much to take   Commonly known as:  LANTUS   Inject 8 Units into the skin nightly.   Notes to Patient:    RESUME TAKING Tuesday February 28th at bedtime       isosorbide mononitrate 120 MG 24 hr tablet   Dose:  120 mg   Commonly  known as:  IMDUR   Take 120 mg by mouth daily.   Notes to Patient:    LAST DOSE GIVEN: Tuesday February 28th    NEXT DOSE DUE: Wednesday March 1st       pantoprazole 40 MG tablet   Dose:  40 mg   Commonly known as:  PROTONIX   Take 40 mg by mouth 2 (two) times daily.   Notes to Patient:    LAST DOSE GIVEN: Tuesday February 28th at 8:14 AM    NEXT DOSE DUE: Tuesday February 28th before dinner       SITagliptin 50 MG tablet   Dose:  25 mg   Commonly known as:  JANUVIA   Take 25 mg by mouth daily.   Notes to Patient:    LAST DOSE GIVEN: Tuesday February 28th    NEXT DOSE DUE: Wednesday March 1st         STOP taking these medications          furosemide 40 MG tablet   Commonly known as:  LASIX            Labs/Images to be followed at your PCP office:       Unresulted Labs     None          Hospital Problems:Principal Problem:    SOB (shortness of breath)  Active Problems:    ARF (acute renal failure)    Pulmonary edema    Acute pulmonary edema    Acute renal failure, unspecified acute renal failure type    CKD (chronic kidney disease), unspecified stage    Acute diastolic heart failure     Lists the present on admission hospital problems:Present on Admission:   . SOB (shortness of breath)  . ARF (acute renal failure)  . Pulmonary edema  . Acute pulmonary edema   Follow up:   Follow-up Information     Follow up with [facility] On 06/04/2015.    Why:   at 10:30 am.  there is no opening at the [facility] clinic, but patient is medicare focus and needs to see the MD in 48 hours    Contact information:    [address] [place] [address]  [address]  [place] [phone]  [phone]        Follow up with Harlow Ohms, MD.        Follow up with Emeline General, MD In 1 week.    Specialty:  Cardiology    Contact information:    [address]  [place] [address]  [phone]          Follow up with Queen Slough, MD In 3 weeks.    Specialties:  Nephrology, Internal Medicine     Contact information:    [address]  [place] [address]  [phone]          Follow up with Stacie Glaze, NP .    Specialty:  Nurse Practitioner    Contact information:    [address]  [place] [address]  [phone]  Results of Labs/imaging:   Labs have been reviewed:   Coagulation Profile:        CBC review:       Invalid input(s):  Lauro Franklin, BASOPHILSAUT   Chem Review:  Recent Labs  Lab 06/04/15  1105 06/03/15  0518 06/02/15  0546 06/01/15  0614 05/31/15  0743   SODIUM 128* 131* 135* 134* 134*   POTASSIUM 4.4 4.1 4.0 3.8 4.1   CHLORIDE 90* 96* 98* 100 100   CO2 24 25 26 24  21*   BUN 44* 47.6* 46.8* 46.7* 51.6*   CREATININE 2.16* 2.3* 2.4* 2.3* 2.5*   GLUCOSE 244* 113* 137* 112* 122*   CALCIUM 8.8 8.6 8.9 8.5 8.8      Results     Procedure Component Value Units Date/Time    Glucose Whole Blood - POCT [id number]  (Abnormal) Collected:  06/03/15 1120     POCT - Glucose Whole blood 147 (H) mg/dL Updated:  06/03/15 1127    Glucose Whole Blood - POCT [id number] Collected:  06/03/15 0739     POCT - Glucose Whole blood 92 mg/dL Updated:  06/03/15 [id number]    Basic Metabolic Panel [id number]  (Abnormal) Collected:  06/03/15 0518    Specimen Information:  Blood Updated:  06/03/15 0708     Glucose 113 (H) mg/dL      BUN 47.6 (H) mg/dL      Creatinine 2.3 (H) mg/dL      Calcium 8.6 mg/dL      Sodium 131 (L) mEq/L      Potassium 4.1 mEq/L      Chloride 96 (L) mEq/L      CO2 25 mEq/L      Anion Gap 10.0     GFR [id number] Collected:  06/03/15 0518     EGFR 27.0 Updated:  06/03/15 0708    Glucose Whole Blood - POCT [id number]  (Abnormal) Collected:  06/02/15 2031     POCT - Glucose Whole blood 139 (H) mg/dL Updated:  06/02/15 2050    Glucose Whole Blood - POCT [id number]  (Abnormal) Collected:  06/02/15 1656     POCT - Glucose Whole blood 123 (H) mg/dL Updated:  06/02/15 1725         Radiology reports have been reviewed:  Radiology Results (24 Hour)     ** No results  found for the last 24 hours. **        Echocardiogram Adult Complete W Clr/ Dopp Waveform    05/27/2015  ECHOCARDIOGRAM Sonographer:  Denton Ar Indications:  Pulmonary edema, Shortness of breath Height (in):  62 Weight (lb):  171 Blood Pressure:  154/67   2-D Measurements Left Ventricle                                          Diastolic Dimension:  55  ([id number] mm) Systolic Dimension:  28  (25-40 mm)     Septal Diastolic Thickness:  10  (6-11 mm)    Posterior Wall Thickness:  10  (6-11 mm) Fractional Shortening Percentage:  48%  (24-46 %)                       Visually Estimated Ejection Fraction:  75%  (55-75 %)  Right Ventricle Diastolic Dimension:  39  (7-39 mm)                           Left Atrium End Systolic Dimension:  39  (19-40 mm)                    Aortic Root:  35  (20-37 mm)                       Doppler Measurements and Color Flow Imaging Valves                                        Aortic Valve:  2.7  (0.9-1.8 m/s).         Regurgitation:  Mild to moderate Pulmonic Valve:  1.8  (0.6-0.9 m/s).     Regurgitation:  Mild Mitral Valve:  1.6  (0.6-1.4 m/s).          Regurgitation: Severe Tricuspid Valve:  0.5  (0.4-0.8 m/s.     Regurgitation:  Severe Left Ventricular Outflow Tract Velocity:  1.7 m/s. E/A Ratio:  2.1 Est. PASP:  73 Est. RA Mean Pressure:  3 LA Volume 84 (18-58 ml) LA Volume Index 47 (16-28 ml/m2) TAPSE 26 (> or =16 mm) Echocardiogram M-mode, 2D, spectral Doppler and color flow imaging were performed and interpreted.     05/27/2015  1.  The quality of the study is technically adequate for interpretation. 2.  The left ventricle is normal in size. Left ventricular systolic function is normal. There are no regional wall motion abnormalities. Estimated EF is 75%. There is no left ventricular hypertrophy. There is evidence of abnormal diastolic LV function. 3.  The left atrium is severely dilated. 4.  The aortic valve is trileaflet. Valve leaflets are sclerotic.  There is  mild to moderate aortic insufficiency. Mild aortic stenosis is present. The aortic root is normal in size. 5.  The mitral valve leaflets are mildly thickened.  Posterior mitral annular calcification. Severe mitral insufficiency is present. 6.  The right ventricle is normal in size and function. 7.  The right atrium is normal in size. 8. The tricuspid valve is structurally normal. There is severe tricuspid insufficiency. 9. The pulmonic valve is structurally normal. There is mild pulmonic insufficiency. 10. There is evidence of severe pulmonary hypertension. 11. No pericardial effusion, intracardiac thrombi, or masses are seen. 12. The atrial septum is structurally normal. No shunt by color flow doppler. CONCLUSION: 1. Normal left ventricular size and systolic function, LVEF [id number]. 2. Impaired left ventricular relaxation. 3. Severe left atrial enlargement. 4. Severe mitral and tricuspid regurgitation. 5. Mild to moderate aortic regurgitation.  Mild aortic stenosis. 6. Severe pulmonary hypertension, estimated PASP 43mmHg. Mal Misty, MD 05/27/2015 3:32 PM     US Renal Kidney    05/28/2015  CLINICAL HISTORY: Elevated creatinine with unknown baseline COMPARISON: None RENAL ULTRASOUND FINDINGS: There is increased echogenicity of the kidneys with thinning of the renal parenchyma suggestive of chronic medical renal disease. Right kidney measures 9.8 cm., and left kidney measures 10.5 cm.  There is a 0.5 x 0.6 x 0.6 m right lower pole simple appearing renal cyst.  No stone or hydronephrosis is evident.  The pre-void bladder appears grossly normal. There is no significant post void residual.     05/28/2015  Findings compatible  with chronic medical renal disease. Mertha Baars, MD 05/28/2015 2:45 PM     Xr Chest  Ap Portable    05/26/2015  HISTORY: Dyspnea. COMPARISON: None. EXAMINATION: Chest x-ray AP portable single view performed on 05/26/2015. FINDINGS: Atherosclerotic aortic calcifications are seen. The cardiac  silhouette is mildly enlarged. There is mild prominence of the pulmonary vascularity. No focal consolidation, pneumothorax or large pleural effusion.     05/26/2015  Cardiomegaly with mild pulmonary edema. Lorenda Hatchet, MD 05/26/2015 1:57 PM     US Venous Duplex Doppler Arm Right    05/27/2015  HISTORY: Rule out DVT. Right arm swelling. COMPARISON: None FINDINGS: Gray scale, color and spectral Doppler evaluation of the right upper extremity was performed. The internal jugular, innominate, subclavian, axillary, brachial, basilic and cephalic veins are patent, demonstrating normal compressibility where feasible, normal phasicity and pulsatility.     05/27/2015   No evidence for deep venous thrombosis within the right upper extremity Ardelia Mems, MD 05/27/2015 9:08 PM      Pathology:   Specimens     None             Hospitalist:   Signed by: Hoy Finlay   06/06/2015 4:05 PM   Time spent for discharge: 35 minutes

## 2015-06-09 ENCOUNTER — Telehealth (INDEPENDENT_AMBULATORY_CARE_PROVIDER_SITE_OTHER): Payer: Self-pay | Admitting: "Endocrinology

## 2015-06-09 ENCOUNTER — Telehealth: Payer: Self-pay

## 2015-06-09 ENCOUNTER — Other Ambulatory Visit (INDEPENDENT_AMBULATORY_CARE_PROVIDER_SITE_OTHER): Payer: Self-pay | Admitting: "Endocrinology

## 2015-06-09 MED ORDER — ISOSORBIDE MONONITRATE ER 120 MG PO TB24
120.0000 mg | ORAL_TABLET | Freq: Every day | ORAL | Status: DC
Start: 2015-06-09 — End: 2015-06-16

## 2015-06-09 MED ORDER — ATORVASTATIN CALCIUM 40 MG PO TABS
40.0000 mg | ORAL_TABLET | Freq: Every evening | ORAL | Status: DC
Start: 2015-06-09 — End: 2016-09-25

## 2015-06-09 MED ORDER — SITAGLIPTIN PHOSPHATE 50 MG PO TABS
25.0000 mg | ORAL_TABLET | Freq: Every day | ORAL | Status: DC
Start: 2015-06-09 — End: 2015-07-07

## 2015-06-09 MED ORDER — AMLODIPINE BESYLATE 10 MG PO TABS
10.0000 mg | ORAL_TABLET | Freq: Every day | ORAL | Status: DC
Start: 2015-06-09 — End: 2015-06-16

## 2015-06-09 MED ORDER — BUMETANIDE 1 MG PO TABS
1.0000 mg | ORAL_TABLET | Freq: Two times a day (BID) | ORAL | Status: DC
Start: 2015-06-09 — End: 2015-07-28

## 2015-06-09 MED ORDER — HYDRALAZINE HCL 100 MG PO TABS
100.0000 mg | ORAL_TABLET | Freq: Three times a day (TID) | ORAL | Status: DC
Start: 2015-06-09 — End: 2016-08-12

## 2015-06-09 MED ORDER — DOXAZOSIN MESYLATE 4 MG PO TABS
4.0000 mg | ORAL_TABLET | Freq: Every evening | ORAL | Status: DC
Start: 2015-06-09 — End: 2015-06-16

## 2015-06-09 MED ORDER — PANTOPRAZOLE SODIUM 40 MG PO TBEC
40.0000 mg | DELAYED_RELEASE_TABLET | Freq: Two times a day (BID) | ORAL | Status: DC
Start: 2015-06-09 — End: 2015-07-04

## 2015-06-09 NOTE — Telephone Encounter (Signed)
Left voicemail message to patient's daughter Emran, [employer] mobile number to call back [facility] regarding BMP result.  Will need repeat BMP due to low Sodium.  Called listed home phone tel number, no voicemail box set up to leave message.

## 2015-06-09 NOTE — Telephone Encounter (Signed)
Second call to patient's daughter, left voicemail to call back [facility].  Listed home phone, no access to leave voicemail.

## 2015-06-09 NOTE — Telephone Encounter (Signed)
Transitional Care Management     Writer called patients daughter at the following number: [phone], for initial CHF education call, left VM with contact information.  This is second call attempt.  Will f/u next week.  [place] interpreter ID [id number], used on call.    Ileana Roup, RN, BSN   Case Public librarian Transitional Care Management   [phone]

## 2015-06-09 NOTE — Progress Notes (Signed)
Spoke with patient's daughter, Ms. [name], [name] regarding low sodium level at 128 and need to repeat levels tomorrow.  States that patient has appt with his Cardiology tomorrow and won't be able to come to clinic for repeat BMP.  Discussed with Ms. Butron that I will send result to his Cardiologist.

## 2015-06-10 ENCOUNTER — Ambulatory Visit (INDEPENDENT_AMBULATORY_CARE_PROVIDER_SITE_OTHER): Payer: Self-pay

## 2015-06-10 ENCOUNTER — Ambulatory Visit (INDEPENDENT_AMBULATORY_CARE_PROVIDER_SITE_OTHER): Payer: Self-pay | Admitting: Nurse Practitioner

## 2015-06-10 LAB — VAHRT HISTORIC LVEF: Ejection Fraction: 75 %

## 2015-06-16 ENCOUNTER — Encounter (INDEPENDENT_AMBULATORY_CARE_PROVIDER_SITE_OTHER): Payer: Self-pay | Admitting: Family Medicine

## 2015-06-16 ENCOUNTER — Ambulatory Visit (INDEPENDENT_AMBULATORY_CARE_PROVIDER_SITE_OTHER): Payer: Medicare Other | Admitting: Family Medicine

## 2015-06-16 VITALS — BP 150/65 | HR 60 | Temp 98.1°F | Resp 16 | Ht 62.0 in | Wt 148.0 lb

## 2015-06-16 DIAGNOSIS — N189 Chronic kidney disease, unspecified: Secondary | ICD-10-CM

## 2015-06-16 DIAGNOSIS — R609 Edema, unspecified: Secondary | ICD-10-CM

## 2015-06-16 DIAGNOSIS — Z09 Encounter for follow-up examination after completed treatment for conditions other than malignant neoplasm: Secondary | ICD-10-CM

## 2015-06-16 DIAGNOSIS — I5031 Acute diastolic (congestive) heart failure: Secondary | ICD-10-CM

## 2015-06-16 DIAGNOSIS — J810 Acute pulmonary edema: Secondary | ICD-10-CM

## 2015-06-16 HISTORY — DX: Edema, unspecified: R60.9

## 2015-06-16 NOTE — Progress Notes (Signed)
Marco Bruce     male     April 01, 1927     Chief Complaint   Patient presents with   . Congestive Heart Failure     new patient   . Diabetes       Congestive Heart Failure  Presents for initial visit. Onset time: diagnosed several years ago. Associated symptoms include shortness of breath. Pertinent negatives include no abdominal pain, chest pain, fatigue or palpitations. (Swelling) Treatments tried: see emr for current list. Past compliance problems: language barrier. There is no history of CVA. Compliance with diet is 76-100%. Compliance with medications is 76-100%.   Diabetes  Pertinent negatives for hypoglycemia include no dizziness or headaches. Pertinent negatives for diabetes include no chest pain and no fatigue.        HIGHLY COMPLEX MEDICAL HISTORY INCLUDING RECENT 2 ADMISSION FROM [place] AND [place] FOR ACUTE ON CHRONIC CHF WITH ACUTE ON CHRONIC RENAL FAILURE, UNCONTROLLED BP, UNCONTROLLED DM2 ON INSULIN, GERD, HYPERLIPIDEMIA AND STAGE 4 CKD.     ECHO SHOWED [id number] EF WITH DIASTOLIC FAILURE, MR AND TR WITH PULMONARY HYPERTENSION.   NOT ON ACE, BB, OR ASA DUE TO CONTRAINDICATION.     PT IS AMBULATORY AND CONVERSATIONAL, SEVERE LANGUAGE BARRIER AND HISTORY WAS DEPENDENT ON NEPHEW [name] AT BEDSIDE.     PT FEELS FAIRLY DECENT TODAY BUT DOES C/O BL PERIPHERAL EDEMA  ONSET AFTER THE [place] ADMISSION TO WHICH HE RECEIVED A LARGE AMOUNT OF IVF FOR HIS ACUTE ON CHRONIC CKD FAILURE. CCB WAS ALSO RECENTLY ADDED.     AT HOME, HE CAN PERFORM HIS ADLs, BUT LIMITED TO WALKING TO 1 FLIGHT OF STAIRS WITH ASSISTANCE AND NOT MORE THAN 100 FEET ON A FLAT SURFACE.    NEPHEW SAYS HE IS WEIGHTED EVERY MORNING, DECENT ORAL INTAKE AND ELIMINATIONS.       Current Outpatient Prescriptions   Medication Sig Dispense Refill   . atorvastatin (LIPITOR) 40 MG tablet Take 1 tablet (40 mg total) by mouth every evening. 30 tablet 3   . bumetanide (BUMEX) 1 MG tablet Take 1 tablet (1 mg total) by mouth 2 (two) times daily. 60 tablet 0   .  hydrALAZINE (APRESOLINE) 100 MG tablet Take 1 tablet (100 mg total) by mouth 3 (three) times daily. 90 tablet 3   . insulin glargine (LANTUS SOLOSTAR) 100 UNIT/ML injection pen Inject 8 Units into the skin nightly.        . isosorbide mononitrate (IMDUR) 120 MG 24 hr tablet Take 120 mg by mouth.     . Lidocaine 4 % Patch Apply 2 patches topically daily. 30 patch 0   . NIFEdipine (PROCARDIA XL) 60 MG 24 hr tablet TAKE ONE TABLET BY MOUTH EVERY DAY  2   . pantoprazole (PROTONIX) 40 MG tablet Take 1 tablet (40 mg total) by mouth 2 (two) times daily. 30 tablet 0   . SITagliptin (JANUVIA) 50 MG tablet Take 0.5 tablets (25 mg total) by mouth daily. 30 tablet 2     No current facility-administered medications for this visit.       Past Medical History   Diagnosis Date   . Diabetes mellitus    . Hypertension    . Congestive heart failure (CHF)        Past Surgical History   Procedure Laterality Date   . Abdominal surgery         History reviewed. No pertinent family history.     reports that he has quit smoking. He does  not have any smokeless tobacco history on file. He reports that he does not drink alcohol or use illicit drugs.    No Known Allergies    The following portions of the patient's history were reviewed and updated as appropriate: allergies, current medications, past family history, past medical history, past social history, past surgical history and problem list.    Filed Vitals:    06/16/15 1513   BP: 150/65   Pulse: 60   Temp: 98.1 F (36.7 C)   TempSrc: Oral   Resp: 16   Height: 1.575 m (5\' 2" )   Weight: 67.132 kg (148 lb)   SpO2: 93%        REVIEW OF SYSTMEMS    Review of Systems   Constitutional: Negative for fever, chills, malaise/fatigue and fatigue.   HENT: Negative for congestion.    Eyes: Negative for pain and discharge.   Respiratory: Positive for shortness of breath. Negative for cough and sputum production.    Cardiovascular: Negative for chest pain and palpitations.   Gastrointestinal: Negative  for vomiting and abdominal pain.   Genitourinary: Negative for dysuria and urgency.   Musculoskeletal: Negative for myalgias and neck pain.   Skin: Negative for itching and rash.   Neurological: Negative for dizziness, loss of consciousness and headaches.   Psychiatric/Behavioral: Negative for depression, suicidal ideas and hallucinations.        Physical Exam   Constitutional: He is oriented to person, place, and time and well-developed, well-nourished, and in no distress.   HENT:   Head: Normocephalic and atraumatic.   Eyes: Conjunctivae and EOM are normal. Pupils are equal, round, and reactive to light.   Neck: Normal range of motion. Neck supple.       Cardiovascular: Normal rate, regular rhythm and S1 normal.  Exam reveals gallop and S3.    Murmur heard.   Systolic murmur is present with a grade of 2/6   Pulses:       Carotid pulses are 2+ on the right side, and 2+ on the left side.      Pulmonary/Chest: Effort normal. He has decreased breath sounds in the right lower field and the left lower field. He has no wheezes. He has no rhonchi. He has rales in the right lower field and the left lower field.       Abdominal: Soft. Bowel sounds are normal.   Musculoskeletal: Normal range of motion.        Right lower leg: He exhibits swelling and edema. He exhibits no tenderness, no bony tenderness, no deformity and no laceration.        Left lower leg: He exhibits swelling. He exhibits no tenderness, no bony tenderness, no deformity and no laceration. Left lower leg edema: 2 BL EDEMA UP TO MID SHIN         Legs:  Neurological: He is alert and oriented to person, place, and time. Gait normal.   Skin: Skin is warm and dry.   Psychiatric: Mood and affect normal.        Encounter Diagnoses   Name Primary?   [facility] discharge follow-up Yes   . Acute diastolic heart failure    . Acute pulmonary edema    . CKD (chronic kidney disease), unspecified stage    . Peripheral edema           Marl was seen today for congestive  heart failure and diabetes.    Diagnoses and all orders for this visit:  Hospital discharge follow-up    RELATIVELY STABLE POST DISCHARGE   DUE TO LANGUAGE BARRIER, NOT MUCH OF HISTORY CAN BE OBTAINED PRIOR TO  ADMISSION      Acute diastolic heart failure    DIAST DYSFUNCTION WITH EF 75% WITH SEVERE MR AND TR AND PULMONARY HYPERTENSION   ON BUMEX + CCB + NOT ON BB + NOT ON ASA + NOT ON STATIN     Acute pulmonary edema     RESOLVED POST DISCHARGE    CKD (chronic kidney disease), unspecified stage    GFR AROUND 27, STAGE 4 NEAR ESRD   NOT ON ACE INH, NOT ON ARB EITHER  GOAL BP IS LESS THAN 140/90      Peripheral edema    SECONDARY TO DIASTOLIC CHF + SIDE EFFECTS OF CCB NIFEDIPINE     I DO NOT RECOMMEND TO BACK OFF OR STOP CCB, SINCE BP IS STILL UNCONTROLLED, ACE INH OR ARB MAY WORSEN KIDNEY FUNCTION FOR NOW  MAX OUT OF HYDRALAZINE AND IMDUR     SCRIPT FOR BL COMPRESSION STOCKING GIVEN       Diabetes type II uncontrolled     ON JANUVIA 25 MG + LANTUS   MONITOR A1C, LAST WAS ABOVE 9.4           Return in about 3 weeks (around 07/07/2015).                          [facility] Osie Bond, MD  Diplomat [employer] Board [employer]

## 2015-06-18 ENCOUNTER — Ambulatory Visit (INDEPENDENT_AMBULATORY_CARE_PROVIDER_SITE_OTHER): Payer: Medicare Other | Admitting: "Endocrinology

## 2015-06-18 ENCOUNTER — Telehealth: Payer: Self-pay

## 2015-06-18 NOTE — Telephone Encounter (Signed)
Transitional Care Management     Writer called patient at the following number: [phone], for initial CHF education call, left VM with contact information.  This is third call attempt.  Writer notes that patient has an appointment with [facility] on 06/18/2015. Writer has sent message to Nurse Coordinator Marcela at [facility], informing her if patient attends appointment to provide contact number and inform that writer is able to assist with follow up as needed.  Will close case.    Ileana Roup, RN, BSN   Case Public librarian Transitional Care Management   [phone]

## 2015-06-20 ENCOUNTER — Ambulatory Visit (INDEPENDENT_AMBULATORY_CARE_PROVIDER_SITE_OTHER): Payer: Medicare Other | Admitting: "Endocrinology

## 2015-06-24 ENCOUNTER — Other Ambulatory Visit (INDEPENDENT_AMBULATORY_CARE_PROVIDER_SITE_OTHER): Payer: Self-pay | Admitting: Family Medicine

## 2015-06-24 MED ORDER — GLUCOSE BLOOD VI STRP
1.0000 | ORAL_STRIP | Freq: Two times a day (BID) | Status: DC
Start: 2015-06-24 — End: 2015-06-26

## 2015-06-24 NOTE — Telephone Encounter (Signed)
LOV: 04/2015

## 2015-06-25 DIAGNOSIS — J189 Pneumonia, unspecified organism: Secondary | ICD-10-CM

## 2015-06-25 HISTORY — DX: Pneumonia, unspecified organism: J18.9

## 2015-06-26 ENCOUNTER — Other Ambulatory Visit (INDEPENDENT_AMBULATORY_CARE_PROVIDER_SITE_OTHER): Payer: Self-pay

## 2015-06-26 ENCOUNTER — Ambulatory Visit (INDEPENDENT_AMBULATORY_CARE_PROVIDER_SITE_OTHER): Payer: Medicare Other | Admitting: "Endocrinology

## 2015-06-26 ENCOUNTER — Encounter (INDEPENDENT_AMBULATORY_CARE_PROVIDER_SITE_OTHER): Payer: Self-pay | Admitting: "Endocrinology

## 2015-06-26 VITALS — BP 140/57 | HR 48 | Temp 97.7°F | Resp 18 | Wt 151.6 lb

## 2015-06-26 DIAGNOSIS — I5030 Unspecified diastolic (congestive) heart failure: Secondary | ICD-10-CM

## 2015-06-26 MED ORDER — GLUCOSE BLOOD VI STRP
1.0000 | ORAL_STRIP | Freq: Two times a day (BID) | Status: DC
Start: 2015-06-26 — End: 2016-10-26

## 2015-06-26 NOTE — Patient Instructions (Signed)
1) Start Aspirin 81 mg. 1 tablet by mouth daily.     2) Continue to take all your other medications as prescribed.     3) Diabetes Melitus  Check your blood sugar in the morning, before breakfast, before dinner and 2 hours after dinner.   Record your blood sugar readings in a note book and return with note book at next visit.  Do not give yourself insulin if you skip a meal.    4) Hypoglycemia (low blood sugar) and hyperglycemia (high blood sugar) instructions  Symptoms of Hypoglycemia (low blood sugars)                      Feeling shaky, weak or hungry                      Dizziness or lightheadedness            Numbness and tingling of the lips                  Sweating                                                        Headaches                                                    Problems with vision    What to do if you experience hypoglycemia (low blood sugars)  Eat or drink something with 15 grams of carbs  1/2 cup of fruit juice e.g orange juice  1/2 cup of soft drink (avoid diet drinks)  Glucose tablets   1 tablespoon of honey    Symptoms of Hyperglycemia  (high blood sugars)   Feeling tired or fatigued  Increased thirst  Frequent urination  Blurred vision  Headaches    You were seen today at [facility]     Managing your Heart Failure    1. When you get up, empty your bladder before you eat or drink  2. Before you get dressed, get on the scale  3. Record your weight on this paper; don't depend on your memory  4. Try to weigh yourself at about the same time every day   5. Bring your weight calendar with you to your doctor visits      If your weight goes up by 2 pounds in 1 day or 5 pounds in a week, call the clinic at  [phone]    Date Day of Week Weight (lbs) Blood Pressure      /      /      /      /      /      /      /      /      /      /      /      /      /      /      /     Heart Failure "Zones"    Green Zone - All Clear  Action    No new or worsening shortness of breath   No  new or worsening swelling of your hands, abdomen, legs or ankles   No Weight gain   No chest pain or tightness   No decrease in your ability to maintain your activity level    Continue taking your medications as ordered   Continue your daily weights   Follow a low salt diet   Keep all physician appointments and follow-ups   Yellow Zone - Caution Action    Weight gain of 2 pounds in a day   Increased swelling of your hands, abdomen, legs, or ankles   Increased in shortness of breath with activity   Increase in the number of pillows needed to sleep at night   New or more frequent chest pain or tightness   New onset of dizziness or lightheadedness after standing up    Call your physician's office  Do not wait until you are RED to call.  Call for even one symptom!   Red Zone - "Emergency" Action    Unrelieved shortness of breath or shortness of breath at rest   Unrelieved chest pain   Wheezing or chest tightness at rest   Need to sit in a chair to sleep   Weight gain of more than 3 pounds in a day or 5 pounds in a week   A fall related to dizziness or lightheadedness CALL your physician's office right away!  Call 911 if you:   Faint or pass out   Become extremely short of breath or are unable to talk due to breathlessness   Have severe chest pain

## 2015-06-26 NOTE — Telephone Encounter (Signed)
Received voicemail call from [employer].  States for Medicare Needs Dx listed on Prescription. ICD-10 added to script from Medical History (Snapshot).  Please Resend.  Thanks.

## 2015-06-28 MED ORDER — ASPIRIN EC 81 MG PO TBEC
81.0000 mg | DELAYED_RELEASE_TABLET | Freq: Every day | ORAL | Status: DC
Start: 2015-06-28 — End: 2015-07-23

## 2015-06-28 NOTE — Progress Notes (Signed)
History of Present Illness:     This patient is a 82 y.o. male,  PMH significant for T2DM, CKD and HTN, who was initially referred to [facility] after hospitalization for acute on chronic renal failure, acute diastolic heart failure with volume overload, and severe pulmonary hypertension. Cardiology and Nephrology were consulted, he was managed on IV Bumex, subsequently transitioned to oral Bumex once his respiratory sx and volume overload improved. Echo showed an EF of 75 %, severe MR, TR, and mild AS. 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     Patient presents to clinic today with his grandson, Georgina Snell,  for a follow-up. Reports the following:    1) T2DM - patient's grandson verbally reports fasting BS in the low 100s (110 to 130) and post prandial BS in the 180s.  Patient denies any hypoglycemic symptoms.  Taking Lantus 8 units HS and Januvia 50 mg daily.   2) Diastolic HF - denies any SOB at rest, able to use 1 flight of stairs with minimal DOE, no DOE on flat surface, no abdominal bloating, or extremity edema. Clinic weight has been stable.  Taking Bumex 1 mg BID. Saw Cardiologist after hospital d/c, recommended to continue same cardiac meds.   3) HTN - denies headache, dizziness, chest pain or palpitation. Taking Hydralazine, Procardia and Imdur.   4) CKD - as above no SOB, DOE, abdominal bloating or extremity edema.  Denies abdominal pain or flank pain, no malaise or weakness.   5) Continues to have intermittent bilateral knee pain, which was present since his hospitalization. There is no weakness, erythema, warmth or edema.      Review of Systems:     Review of Systems   Constitutional: Negative for fever, chills, malaise/fatigue and diaphoresis.   HENT: Negative for congestion.    Respiratory: Negative for cough, shortness of breath and wheezing.    Cardiovascular: Negative for chest pain, palpitations, orthopnea,  claudication, leg swelling and PND.   Gastrointestinal: Negative for nausea, vomiting and abdominal pain.   Genitourinary: Negative for dysuria, urgency, frequency, hematuria and flank pain.   Musculoskeletal: Positive for joint pain (bilateral knees). Negative for myalgias and falls.   Neurological: Negative for dizziness, tingling, tremors, focal weakness, loss of consciousness, weakness and headaches.       Physical Exam:     Filed Vitals:    06/26/15 1105   BP: 140/57   Pulse: 48   Temp: 97.7 F (36.5 C)   Resp: 18   SpO2: 96%       Wt Readings from Last 3 Encounters:   06/26/15 68.765 kg (151 lb 9.6 oz)   06/16/15 67.132 kg (148 lb)   06/04/15 73.936 kg (163 lb)       General: awake, alert, oriented x 3  HEENT: perrla, eomi, sclera anicteric,oropharynx clear without lesions, mucous membranes moist  Neck: supple, no lymphadenopathy, no thyromegaly, no JVD, no carotid bruits  Cardiovascular: S1, S2, faint systolic murmur  Lungs: diminished breath sounds lung bases otherwise no wheezing or rales   Abdomen: soft, non tender, normal bowel sounds  Extremities: no clubbing, cyanosis, or edema; bilateral knees no erythema, warmth, edema or tenderness; other joints no erythema, warmth, inflammation or tenderness  Neuro: cranial nerves grossly intact, strength 5/5 in upper and lower extremities, sensation intact  Skin: no rashes or lesions noted    Diagnostics:     Lab Results   Component Value Date  WBC 6.54 05/29/2015    HGB 9.3* 05/29/2015    HCT 28.6* 05/29/2015    PLT 265 05/29/2015    CHOL 113* 05/31/2006    TRIG 125 05/31/2006    LDL 45* 05/31/2006    ALT 26 05/26/2015    AST 33 05/26/2015    NA 128* 06/04/2015    K 4.4 06/04/2015    CL 90* 06/04/2015    CREAT 2.16* 06/04/2015    BUN 44* 06/04/2015    CO2 24 06/04/2015    PSA 1.0 01/12/2006    GLU 244* 06/04/2015    HGBA1C 9.6* 05/28/2015       No results found.    Assessment:     Patient Active Problem List   Diagnosis   . SOB (shortness of breath)   . ARF  (acute renal failure)   . Pulmonary edema   . Acute pulmonary edema   . Acute renal failure, unspecified acute renal failure type   . CKD (chronic kidney disease), unspecified stage   . Acute diastolic heart failure   . Hospital discharge follow-up   . Peripheral edema   . Pneumonia due to infectious organism, unspecified laterality, unspecified part of lung       Plan:     1) CHF (diastolic HF) - weight stable, euvolemic  - continue Bumex 1 mg BID; not on ACE/ARB due to recent worsening renal function; not on BB due to bradycardia, AV block; f/u with Cardiology as planned    2) T2DM - stable - continue same dose Lantus 8 units HS and Januvia 50 mg daily.  Continue to monitor BS BID (fasting and 2 hour post prandial dinner)    3) HTN - stable - continue Hydralazine, Procardia and Imdur    4) HLD - continue Atorvastatin    5) Continue Protonix for GI protection     6) Joint pain - apply Lidocaine 4 % topical patch to affected areas daily as needed    Follow up: discharge  Discharged from Transitional Clinic?:yes  Medical Home Referred to/Referred Back to: IMG      Med Rec Complete: yes    Teach back for HF, COPD, DM, AMI, PNA: yes

## 2015-06-29 ENCOUNTER — Encounter (INDEPENDENT_AMBULATORY_CARE_PROVIDER_SITE_OTHER): Payer: Self-pay | Admitting: Family Medicine

## 2015-07-02 ENCOUNTER — Ambulatory Visit (INDEPENDENT_AMBULATORY_CARE_PROVIDER_SITE_OTHER): Payer: Medicare Other | Admitting: Nurse Practitioner

## 2015-07-02 ENCOUNTER — Encounter (INDEPENDENT_AMBULATORY_CARE_PROVIDER_SITE_OTHER): Payer: Self-pay | Admitting: Nurse Practitioner

## 2015-07-02 VITALS — BP 184/60 | HR 42 | Temp 98.5°F | Resp 18 | Wt 139.2 lb

## 2015-07-02 DIAGNOSIS — M2550 Pain in unspecified joint: Secondary | ICD-10-CM

## 2015-07-02 MED ORDER — ACETAMINOPHEN-CODEINE 300-30 MG PO TABS
1.0000 | ORAL_TABLET | ORAL | Status: DC | PRN
Start: 2015-07-02 — End: 2015-07-07

## 2015-07-02 NOTE — Progress Notes (Signed)
[facility]    PROGRESS NOTE      Patient: Marco Bruce   Date: 07/02/2015   MRN: [id number]     Past Medical History   Diagnosis Date   . Diabetes mellitus    . Hypertension    . Congestive heart failure (CHF)      Social History     Social History   . Marital Status: Married     Spouse Name: N/A   . Number of Children: N/A   . Years of Education: N/A     Occupational History   . Not on file.     Social History Main Topics   . Smoking status: Former Research scientist (life sciences)   . Smokeless tobacco: Not on file   . Alcohol Use: No   . Drug Use: No   . Sexual Activity: Not on file     Other Topics Concern   . Not on file     Social History Narrative     History reviewed. No pertinent family history.    ASSESSMENT/PLAN     Marco Bruce is a 82 y.o. male    Chief Complaint   Patient presents with   . Joint Pain     3 weeks        1. Arthralgia, unspecified joint  - Acetaminophen-Codeine 300-30 MG per tablet; Take 1 tablet by mouth every 4 (four) hours as needed for Pain.  Dispense: 24 tablet; Refill: 0  Pt has appt with his PCP on 07/07/2015 and he would like pain medication until he see his PCP. Wife and son present and also would like him to have pain medication. Pt. Needs to be very careful taking this medication- family will be with him when he takes medication. F/U with PCP as planned.     Results for orders placed or performed in visit on [id number]   Basic Metabolic Panel   Result Value Ref Range    Glucose 244 (H) 65 - 99 mg/dL    BUN 44 (H) 8 - 27 mg/dL    Creatinine 2.16 (H) 0.76 - 1.27 mg/dL    EGFR 27 (L) >59 mL/min/1.73    EGFR 31 (L) >59 mL/min/1.73    BUN/Creatinine Ratio 20 10 - 22    Sodium 128 (L) 134 - 144 mmol/L    Potassium 4.4 3.5 - 5.2 mmol/L    Chloride 90 (L) 96 - 106 mmol/L    CO2 24 18 - 29 mmol/L    Calcium 8.8 8.6 - 10.2 mg/dL   POCT Glucose   Result Value Ref Range    POCT Glucose WB 176 (A) 70 - 100 mg/dL       Risk & Benefits of the new medication(s) were explained to the patient (and family) who  verbalized understanding & agreed to the treatment plan. Patient (family) encouraged to contact me/clinical staff with any questions/concerns      MEDICATIONS     Current Outpatient Prescriptions   Medication Sig Dispense Refill   . aspirin 81 MG EC tablet Take 1 tablet (81 mg total) by mouth daily.     Marland Kitchen atorvastatin (LIPITOR) 40 MG tablet Take 1 tablet (40 mg total) by mouth every evening. 30 tablet 3   . bumetanide (BUMEX) 1 MG tablet Take 1 tablet (1 mg total) by mouth 2 (two) times daily. 60 tablet 0   . glucose blood (TRUE METRIX BLOOD GLUCOSE TEST) test strip 1 each by Other route 2 (two) times daily.  Use as instructed 200 each PRN   . hydrALAZINE (APRESOLINE) 100 MG tablet Take 1 tablet (100 mg total) by mouth 3 (three) times daily. 90 tablet 3   . insulin glargine (LANTUS SOLOSTAR) 100 UNIT/ML injection pen Inject 8 Units into the skin nightly.        . isosorbide mononitrate (IMDUR) 120 MG 24 hr tablet Take 120 mg by mouth.     . Lidocaine 4 % Patch Apply 2 patches topically daily. 30 patch 0   . NIFEdipine (PROCARDIA XL) 60 MG 24 hr tablet TAKE ONE TABLET BY MOUTH EVERY DAY  2   . pantoprazole (PROTONIX) 40 MG tablet Take 1 tablet (40 mg total) by mouth 2 (two) times daily. 30 tablet 0   . SITagliptin (JANUVIA) 50 MG tablet Take 0.5 tablets (25 mg total) by mouth daily. 30 tablet 2   . Acetaminophen-Codeine 300-30 MG per tablet Take 1 tablet by mouth every 4 (four) hours as needed for Pain. 24 tablet 0     No current facility-administered medications for this visit.       No Known Allergies    SUBJECTIVE     Chief Complaint   Patient presents with   . Joint Pain     3 weeks        HPI    Patient is here for Joint Pain- was hospitalized 3 weeks ago with lots of fluid, after decrease joints became painful.       ROS     Review of Systems   Constitutional: Negative for fever, chills, diaphoresis and unexpected weight change. Activity change: lays in bed quite a bit due to pain.   Respiratory: Negative for  cough, shortness of breath and wheezing.    Cardiovascular: Negative for chest pain, palpitations and leg swelling.   Musculoskeletal:        C/o joint pain- generalized   Skin: Negative.    Neurological: Negative.    Hematological: Does not bruise/bleed easily.   Psychiatric/Behavioral: Negative.        The following portions of the patient's history were reviewed and updated as appropriate: Allergies, Current Medications, Past Family History, Past Medical history, Past social history, Past surgical history, and Problem List.    PHYSICAL EXAM     Filed Vitals:    07/02/15 1613   BP: 184/60   Pulse: 42   Temp: 98.5 F (36.9 C)   TempSrc: Oral   Resp: 18   Weight: 63.141 kg (139 lb 3.2 oz)   SpO2: 92%       Physical Exam   Nursing note and vitals reviewed.  Constitutional: He is oriented to person, place, and time. He appears well-developed and well-nourished.   HENT:   Head: Normocephalic and atraumatic.   Cardiovascular: Regular rhythm, S1 normal, S2 normal and normal heart sounds.   No extrasystoles are present. Bradycardia present.  Exam reveals no gallop, no distant heart sounds and no friction rub.    No murmur heard.  Pulmonary/Chest: Effort normal and breath sounds normal. No respiratory distress. He has no wheezes. He has no rales. He exhibits no tenderness.   Abdominal: Soft. Bowel sounds are normal.   Musculoskeletal: Normal range of motion.   Neurological: He is alert and oriented to person, place, and time.   Skin: Skin is warm and dry.   Psychiatric: He has a normal mood and affect. His behavior is normal. Judgment and thought content normal.     Ortho Exam  Neurologic  Exam     Mental Status   Oriented to person, place, and time.       PROCEDURE(S)     Procedures        Signed,  Marlyce Huge, FNP  07/02/2015

## 2015-07-04 ENCOUNTER — Other Ambulatory Visit (INDEPENDENT_AMBULATORY_CARE_PROVIDER_SITE_OTHER): Payer: Self-pay | Admitting: Family Medicine

## 2015-07-04 ENCOUNTER — Other Ambulatory Visit (INDEPENDENT_AMBULATORY_CARE_PROVIDER_SITE_OTHER): Payer: Self-pay

## 2015-07-04 MED ORDER — RANITIDINE HCL 150 MG PO CAPS
ORAL_CAPSULE | ORAL | Status: DC
Start: 2015-07-04 — End: 2015-07-07

## 2015-07-04 NOTE — Telephone Encounter (Signed)
STOP PROTONIX due to ckd, replace with ranitidine 150 mg 1-2 capsules daily PRN, spoke to daughter on file, questions answered, erx the script in.

## 2015-07-04 NOTE — Telephone Encounter (Signed)
Patient daughter called and requested-Please send refill for PANTOPRAZOLE SOD DR 40 MG TAB twice a day. [place]

## 2015-07-07 ENCOUNTER — Ambulatory Visit (INDEPENDENT_AMBULATORY_CARE_PROVIDER_SITE_OTHER): Payer: Medicare Other | Admitting: Family Medicine

## 2015-07-07 ENCOUNTER — Encounter (INDEPENDENT_AMBULATORY_CARE_PROVIDER_SITE_OTHER): Payer: Self-pay | Admitting: Family Medicine

## 2015-07-07 VITALS — BP 159/78 | HR 47 | Temp 98.1°F | Resp 20 | Wt 140.0 lb

## 2015-07-07 DIAGNOSIS — G894 Chronic pain syndrome: Secondary | ICD-10-CM

## 2015-07-07 DIAGNOSIS — M1990 Unspecified osteoarthritis, unspecified site: Secondary | ICD-10-CM | POA: Insufficient documentation

## 2015-07-07 DIAGNOSIS — R609 Edema, unspecified: Secondary | ICD-10-CM

## 2015-07-07 DIAGNOSIS — I5032 Chronic diastolic (congestive) heart failure: Secondary | ICD-10-CM

## 2015-07-07 DIAGNOSIS — N184 Chronic kidney disease, stage 4 (severe): Secondary | ICD-10-CM

## 2015-07-07 HISTORY — DX: Chronic pain syndrome: G89.4

## 2015-07-07 HISTORY — DX: Chronic diastolic (congestive) heart failure: I50.32

## 2015-07-07 MED ORDER — HYDROCODONE-ACETAMINOPHEN 5-325 MG PO TABS
ORAL_TABLET | ORAL | Status: DC
Start: 2015-07-07 — End: 2015-07-23

## 2015-07-07 NOTE — Progress Notes (Signed)
Marco Bruce     male     [birth year]     Chief Complaint   Patient presents with   . Hypertension       HPI   Patient is here today for 3 week f/u. Concerns today include:  1. Bilateral knee and ankle pain which is chronic but worsening and severe. Due to pain he is having difficulty walking.   2-he reports that with zantac rx, he is experiencing neck and shoulder pain, which he feels is related to the medication because it happens after he takes this medication.  3-lower extremity swelling is improved since last visit. Wears compression stockings daily.          Current Outpatient Prescriptions   Medication Sig Dispense Refill   . aspirin 81 MG EC tablet Take 1 tablet (81 mg total) by mouth daily.     Marland Kitchen atorvastatin (LIPITOR) 40 MG tablet Take 1 tablet (40 mg total) by mouth every evening. 30 tablet 3   . bumetanide (BUMEX) 1 MG tablet Take 1 tablet (1 mg total) by mouth 2 (two) times daily. 60 tablet 0   . glucose blood (TRUE METRIX BLOOD GLUCOSE TEST) test strip 1 each by Other route 2 (two) times daily. Use as instructed 200 each PRN   . hydrALAZINE (APRESOLINE) 100 MG tablet Take 1 tablet (100 mg total) by mouth 3 (three) times daily. 90 tablet 3   . insulin glargine (LANTUS SOLOSTAR) 100 UNIT/ML injection pen Inject 8 Units into the skin nightly.        . isosorbide mononitrate (IMDUR) 120 MG 24 hr tablet Take 120 mg by mouth.     . Lidocaine 4 % Patch Apply 2 patches topically daily. 30 patch 0   . NIFEdipine (PROCARDIA XL) 60 MG 24 hr tablet TAKE ONE TABLET BY MOUTH EVERY DAY  2   . raNITIdine (ZANTAC) 150 MG tablet TAKE ONE TO TWO CAP 20 MINUTES BEFORE MEAL ONCE DAILY  3   . SITagliptin (JANUVIA) 50 MG tablet Take 50 mg by mouth daily. 1/2 tablet daily     . HYDROcodone-acetaminophen (NORCO) 5-325 MG per tablet TAKE 1 TABLET EVERY 12HR AS NEEDED FOR PAIN 40 tablet 0     No current facility-administered medications for this visit.       Past Medical History   Diagnosis Date   . Diabetes mellitus    .  Hypertension    . Congestive heart failure (CHF)        Past Surgical History   Procedure Laterality Date   . Abdominal surgery         History reviewed. No pertinent family history.     reports that he has quit smoking. He does not have any smokeless tobacco history on file. He reports that he does not drink alcohol or use illicit drugs.    No Known Allergies    The following portions of the patient's history were reviewed and updated as appropriate: allergies, current medications, past family history, past medical history, past social history, past surgical history and problem list.    Filed Vitals:    07/07/15 1510   BP: 159/78   Pulse: 47   Temp: 98.1 F (36.7 C)   TempSrc: Oral   Resp: 20   Weight: 63.504 kg (140 lb)   SpO2: 95%        REVIEW OF SYSTMEMS    Review of Systems   Constitutional: Negative for fever, chills and malaise/fatigue.  HENT: Negative for congestion.    Eyes: Negative for pain and discharge.   Respiratory: Negative for cough and sputum production.    Cardiovascular: Negative for chest pain and palpitations.   Gastrointestinal: Negative for vomiting and abdominal pain.   Genitourinary: Negative for dysuria and urgency.   Musculoskeletal: Negative for myalgias and neck pain.   Skin: Negative for itching and rash.   Neurological: Negative for dizziness, loss of consciousness and headaches.   Psychiatric/Behavioral: Negative for depression, suicidal ideas and hallucinations.        Physical ExamConstitutional: He is oriented to person, place, and time and well-developed, well-nourished, and in no distress.   HENT:   Head: Normocephalic and atraumatic.   Eyes: Conjunctivae and EOM are normal. Pupils are equal, round, and reactive to light.   Neck: Normal range of motion. Neck supple.       Cardiovascular: Normal rate, regular rhythm and S1 normal.  Exam reveals gallop and S3.    Murmur heard.   Systolic murmur is present with a grade of 2/6   Pulses:       Carotid pulses are 2+ on the right  side, and 2+ on the left side.      Pulmonary/Chest: Effort normal. He has decreased breath sounds in the right lower field and the left lower field. He has no wheezes. He has no rhonchi. He has rales in the right lower field and the left lower field.       Abdominal: Soft. Bowel sounds are normal.   Musculoskeletal: Normal range of motion.   BL LOWER EXT EDEMA HAS MARKEDLY IMPROVED ON COMPRESSION STOCKINGS   Neurological: He is alert and oriented to person, place, and time. Gait normal.   Skin: Skin is warm and dry.   Psychiatric: Mood and affect normal.      Encounter Diagnoses   Name Primary?   . Chronic diastolic heart failure Yes   . CKD (chronic kidney disease) stage 4, GFR 15-29 ml/min    . Peripheral edema    . Chronic pain syndrome    . Arthritis           Yonason was seen today for hypertension.    Diagnoses and all orders for this visit:    Chronic diastolic heart failure    MAX ON BUMEX AT 1 MG BID + NOT ON POTASSIUM SUPPLEMENT   NOT IN OVERT DECOMPENSATION, STABLE CLINICALLY     CKD (chronic kidney disease) stage 4, GFR 15-29 ml/min    SEEING NEPHROLOGY   PT DID NOT TOLERATED ZANTAC CITING IT GIVE HIM RIGHT ARM AND NECK PAIN     RESUME PPI PREVIOUSLY GIVEN     Peripheral edema    IMPROVED ON COMPRESSION STOCKINGS  SUSPECT COMPONENT OF VENOUS INSUFFICIENCY     Chronic pain syndrome  -     Ambulatory referral to Orthopedic Surgery  -     HYDROcodone-acetaminophen (NORCO) 5-325 MG per tablet; TAKE 1 TABLET EVERY 12HR AS NEEDED FOR PAIN    Arthritis  -     Ambulatory referral to Orthopedic Surgery  -     HYDROcodone-acetaminophen (NORCO) 5-325 MG per tablet; TAKE 1 TABLET EVERY 12HR AS NEEDED FOR PAIN     FAMILY REQUEST ORTHO REFERRAL  STOP T3   START NORCO LOW DOSE Q 12 HR PRN       Return in about 4 weeks (around 08/04/2015).  [facility] Osie Bond, MD  Diplomat [employer]

## 2015-07-09 ENCOUNTER — Ambulatory Visit (INDEPENDENT_AMBULATORY_CARE_PROVIDER_SITE_OTHER): Payer: Medicare Other | Admitting: Orthopaedic Surgery

## 2015-07-10 ENCOUNTER — Encounter (INDEPENDENT_AMBULATORY_CARE_PROVIDER_SITE_OTHER): Payer: Self-pay

## 2015-07-10 ENCOUNTER — Ambulatory Visit (INDEPENDENT_AMBULATORY_CARE_PROVIDER_SITE_OTHER): Payer: Medicare Other

## 2015-07-10 ENCOUNTER — Telehealth (INDEPENDENT_AMBULATORY_CARE_PROVIDER_SITE_OTHER): Payer: Self-pay | Admitting: Family Medicine

## 2015-07-10 VITALS — BP 189/74 | HR 49 | Resp 16

## 2015-07-10 DIAGNOSIS — M4692 Unspecified inflammatory spondylopathy, cervical region: Secondary | ICD-10-CM

## 2015-07-10 DIAGNOSIS — M47812 Spondylosis without myelopathy or radiculopathy, cervical region: Secondary | ICD-10-CM

## 2015-07-10 DIAGNOSIS — M542 Cervicalgia: Secondary | ICD-10-CM

## 2015-07-10 MED ORDER — HYDROCODONE-ACETAMINOPHEN 5-325 MG PO TABS
1.0000 | ORAL_TABLET | Freq: Four times a day (QID) | ORAL | Status: DC | PRN
Start: 2015-07-10 — End: 2015-07-10

## 2015-07-10 MED ORDER — LIDOCAINE 5 % EX PTCH
1.0000 | MEDICATED_PATCH | Freq: Every day | CUTANEOUS | Status: DC
Start: 2015-07-10 — End: 2015-07-23

## 2015-07-10 MED ORDER — HYDROCODONE-ACETAMINOPHEN 5-325 MG PO TABS
1.0000 | ORAL_TABLET | Freq: Three times a day (TID) | ORAL | Status: DC | PRN
Start: 2015-07-10 — End: 2015-07-10

## 2015-07-10 NOTE — Telephone Encounter (Signed)
Please have patient get 08-12-13, it has to be out of pocket, I warn the family medicare would not pay  The prior auth will take days

## 2015-07-10 NOTE — Telephone Encounter (Signed)
Received a call from [facility].  Lidocaine patches that you recommended are not covered by his insurance.  I submitted a prior authorization online per pharmacy request.

## 2015-07-10 NOTE — Progress Notes (Signed)
[facility]    PROGRESS NOTE      Patient: Marco Bruce   Date: 07/10/2015   MRN: [id number]     Past Medical History   Diagnosis Date   . Diabetes mellitus    . Hypertension    . Congestive heart failure (CHF)      Social History     Social History   . Marital Status: Married     Spouse Name: N/A   . Number of Children: N/A   . Years of Education: N/A     Occupational History   . Not on file.     Social History Main Topics   . Smoking status: Former Research scientist (life sciences)   . Smokeless tobacco: Not on file   . Alcohol Use: No   . Drug Use: No   . Sexual Activity: Not on file     Other Topics Concern   . Not on file     Social History Narrative     History reviewed. No pertinent family history.    ASSESSMENT/PLAN     Marco Bruce is a 82 y.o. male    Chief Complaint   Patient presents with   . Neck Pain        1. Neck pain    2. Cervical spine arthritis  - lidocaine (LIDODERM) 5 %; Place 1 patch onto the skin daily. Remove & Discard patch within 12 hours or as directed by MD  Dispense: 30 patch; Refill: 0    Discussed with patient and daughter that arthritic pain commonly worse with rainy weather. Lidoderm patches to neck every 24 hours. If no improvement in 1 week, follow up with Dr Earley Favor. Patient and daughter  in agreement with plan, all questions answered.]     Results for orders placed or performed in visit on [id number]   Basic Metabolic Panel   Result Value Ref Range    Glucose 244 (H) 65 - 99 mg/dL    BUN 44 (H) 8 - 27 mg/dL    Creatinine 2.16 (H) 0.76 - 1.27 mg/dL    EGFR 27 (L) >59 mL/min/1.73    EGFR 31 (L) >59 mL/min/1.73    BUN/Creatinine Ratio 20 10 - 22    Sodium 128 (L) 134 - 144 mmol/L    Potassium 4.4 3.5 - 5.2 mmol/L    Chloride 90 (L) 96 - 106 mmol/L    CO2 24 18 - 29 mmol/L    Calcium 8.8 8.6 - 10.2 mg/dL   POCT Glucose   Result Value Ref Range    POCT Glucose WB 176 (A) 70 - 100 mg/dL       Risk & Benefits of the new medication(s) were explained to the patient (and family) who verbalized understanding & agreed  to the treatment plan. Patient (family) encouraged to contact me/clinical staff with any questions/concerns      MEDICATIONS     Current Outpatient Prescriptions   Medication Sig Dispense Refill   . aspirin 81 MG EC tablet Take 1 tablet (81 mg total) by mouth daily.     Marland Kitchen atorvastatin (LIPITOR) 40 MG tablet Take 1 tablet (40 mg total) by mouth every evening. 30 tablet 3   . bumetanide (BUMEX) 1 MG tablet Take 1 tablet (1 mg total) by mouth 2 (two) times daily. 60 tablet 0   . glucose blood (TRUE METRIX BLOOD GLUCOSE TEST) test strip 1 each by Other route 2 (two) times daily. Use as instructed 200 each PRN   .  hydrALAZINE (APRESOLINE) 100 MG tablet Take 1 tablet (100 mg total) by mouth 3 (three) times daily. 90 tablet 3   . HYDROcodone-acetaminophen (NORCO) 5-325 MG per tablet TAKE 1 TABLET EVERY 12HR AS NEEDED FOR PAIN 40 tablet 0   . insulin glargine (LANTUS SOLOSTAR) 100 UNIT/ML injection pen Inject 8 Units into the skin nightly.        . isosorbide mononitrate (IMDUR) 120 MG 24 hr tablet Take 120 mg by mouth.     . lidocaine (LIDODERM) 5 % Place 1 patch onto the skin daily. Remove & Discard patch within 12 hours or as directed by MD 30 patch 0   . NIFEdipine (PROCARDIA XL) 60 MG 24 hr tablet TAKE ONE TABLET BY MOUTH EVERY DAY  2   . raNITIdine (ZANTAC) 150 MG tablet TAKE ONE TO TWO CAP 20 MINUTES BEFORE MEAL ONCE DAILY  3   . SITagliptin (JANUVIA) 50 MG tablet Take 50 mg by mouth daily. 1/2 tablet daily       No current facility-administered medications for this visit.       No Known Allergies    SUBJECTIVE     Chief Complaint   Patient presents with   . Neck Pain        Neck Pain   This is a new problem. The current episode started yesterday. The pain is associated with nothing. The pain is moderate. Exacerbated by: hyper extension. The pain is same all the time. Pertinent negatives include no chest pain, fever, headaches, numbness or weakness. Treatments tried: tylenol #3  and norco 5/325 RX from Dr. Earley Favor. The  treatment provided no relief.     Patient is [place] speaking, daughter is present with patient to help with translating    ROS     Review of Systems   Constitutional: Negative for fever, chills and fatigue.   HENT: Negative.    Respiratory: Negative for cough, chest tightness, shortness of breath and wheezing.    Cardiovascular: Negative for chest pain.   Gastrointestinal: Negative for nausea, vomiting, abdominal pain and diarrhea.   Musculoskeletal: Positive for neck pain and neck stiffness. Negative for joint swelling.   Skin: Negative for wound.   Neurological: Negative for dizziness, weakness, light-headedness, numbness and headaches.       The following portions of the patient's history were reviewed and updated as appropriate: Allergies, Current Medications, Past Family History, Past Medical history, Past social history, Past surgical history, and Problem List.    PHYSICAL EXAM     Filed Vitals:    07/10/15 1659   BP: 189/74   Pulse: 49   Resp: 16   SpO2: 95%       Physical Exam   Constitutional: He appears well-developed and well-nourished. He appears distressed (appears in pain).   Neck: Spinous process tenderness and muscular tenderness present. No rigidity. Decreased range of motion present. No edema and no erythema present.       Pain in posterior cervical spine, worse with hyperextension. No pain with forward flexion. No pain with passive or active rotation. Significant pain with increased axial load.   Psychiatric: He has a normal mood and affect. His behavior is normal. Judgment and thought content normal.     Ortho Exam  Neurologic Exam    PROCEDURE(S)     Procedures        Signed,  [name], NP  07/10/2015

## 2015-07-23 ENCOUNTER — Encounter (INDEPENDENT_AMBULATORY_CARE_PROVIDER_SITE_OTHER): Payer: Self-pay | Admitting: Nurse Practitioner

## 2015-07-23 ENCOUNTER — Ambulatory Visit (INDEPENDENT_AMBULATORY_CARE_PROVIDER_SITE_OTHER): Payer: Medicare Other | Admitting: Nurse Practitioner

## 2015-07-23 VITALS — BP 177/57 | HR 60 | Resp 16 | Wt 137.0 lb

## 2015-07-23 DIAGNOSIS — K5900 Constipation, unspecified: Secondary | ICD-10-CM

## 2015-07-23 DIAGNOSIS — M545 Low back pain, unspecified: Secondary | ICD-10-CM

## 2015-07-23 DIAGNOSIS — R109 Unspecified abdominal pain: Secondary | ICD-10-CM

## 2015-07-23 LAB — POCT URINALYSIS DIPSTIX (10)(MULTI-TEST)
Bilirubin, UA POCT: NEGATIVE
Blood, UA POCT: NEGATIVE
Glucose, UA POCT: NEGATIVE mg/dL
Ketones, UA POCT: NEGATIVE mg/dL
Nitrite, UA POCT: NEGATIVE
POCT Leukocytes, UA: NEGATIVE
POCT Spec Gravity, UA: 1.015 (ref 1.001–1.035)
POCT pH, UA: 5.5 (ref 5–8)
Protein, UA POCT: 30 mg/dL — AB
Urobilinogen, UA: 0.2 mg/dL

## 2015-07-23 MED ORDER — CYCLOBENZAPRINE HCL 10 MG PO TABS
10.0000 mg | ORAL_TABLET | Freq: Three times a day (TID) | ORAL | Status: DC | PRN
Start: 2015-07-23 — End: 2015-08-22

## 2015-07-23 NOTE — Progress Notes (Signed)
[facility]    PROGRESS NOTE      Patient: Marco Bruce   Date: 07/23/2015   MRN: [id number]     Past Medical History   Diagnosis Date   . Diabetes mellitus    . Hypertension    . Congestive heart failure (CHF)      Social History     Social History   . Marital Status: Married     Spouse Name: N/A   . Number of Children: N/A   . Years of Education: N/A     Occupational History   . Not on file.     Social History Main Topics   . Smoking status: Former Research scientist (life sciences)   . Smokeless tobacco: Not on file   . Alcohol Use: No   . Drug Use: No   . Sexual Activity: Not on file     Other Topics Concern   . Not on file     Social History Narrative     History reviewed. No pertinent family history.    ASSESSMENT/PLAN     Marco Bruce is a 82 y.o. male    Chief Complaint   Patient presents with   . Back Pain        1. Flank pain    2. Lumbar pain with radiation down both legs  - POCT UA Dipstix (10)(Multi-Test)  - cyclobenzaprine (FLEXERIL) 10 MG tablet; Take 1 tablet (10 mg total) by mouth every 8 (eight) hours as needed for Muscle spasms.  Dispense: 30 tablet; Refill: 0    3. Constipation, unspecified constipation type   #1 Lumbar pain- Pt c/o back pain that radiates right flank area and down both legs. Looking at his history this is a chronic problem, U/A normal. Pt tried Norco but experienced constipation. The family- wife and son are with pt and would like to try cyclobenzaprine. Discussed with family concerns with pt prone to falling with this medication. Wife and son reassured me that he does not sleep alone nor is he ever alone. Wife and son expressed their responsibility with monitoring him.   #2 Constipation- Wife is concerned Colace is not helping with constipation- Asked to increase Colace to 2 times a day until sees Dr. Earley Favor on may 1st. Also try prunes or small amts.  prune juice.      Results for orders placed or performed in visit on 07/23/15   POCT UA Dipstix (10)(Multi-Test)   Result Value Ref Range    POCT Spec  Gravity, UA 1.015 1.001 - 1.035    POCT pH, UA 5.5 5 - 8    Glucose, UA POCT Negative Negative mg/dL    Protein, UA POCT =30 (A) Negative mg/dL    Ketones, UA POCT Negative Negative mg/dL    Blood, UA POCT Negative Negative, Trace    POCT Leukocytes, UA Negative Negative    Nitrite, UA POCT Negative Negative    Bilirubin, UA POCT Negative Negative    Urobilinogen, UA 0.2 0.2, 1.0, 2.0 mg/dL       Risk & Benefits of the new medication(s) were explained to the patient (and family) who verbalized understanding & agreed to the treatment plan. Patient (family) encouraged to contact me/clinical staff with any questions/concerns      MEDICATIONS     Current Outpatient Prescriptions   Medication Sig Dispense Refill   . atorvastatin (LIPITOR) 40 MG tablet Take 1 tablet (40 mg total) by mouth every evening. 30 tablet 3   .  bumetanide (BUMEX) 1 MG tablet Take 1 tablet (1 mg total) by mouth 2 (two) times daily. 60 tablet 0   . docusate sodium (COLACE) 100 MG capsule Take 100 mg by mouth 2 (two) times daily.     Marland Kitchen glucose blood (TRUE METRIX BLOOD GLUCOSE TEST) test strip 1 each by Other route 2 (two) times daily. Use as instructed 200 each PRN   . hydrALAZINE (APRESOLINE) 100 MG tablet Take 1 tablet (100 mg total) by mouth 3 (three) times daily. 90 tablet 3   . insulin glargine (LANTUS SOLOSTAR) 100 UNIT/ML injection pen Inject 8 Units into the skin nightly.        . isosorbide mononitrate (IMDUR) 120 MG 24 hr tablet Take 120 mg by mouth.     Marland Kitchen NIFEdipine (PROCARDIA XL) 60 MG 24 hr tablet TAKE ONE TABLET BY MOUTH EVERY DAY  2   . SITagliptin (JANUVIA) 50 MG tablet Take 50 mg by mouth daily. 1/2 tablet daily     . cyclobenzaprine (FLEXERIL) 10 MG tablet Take 1 tablet (10 mg total) by mouth every 8 (eight) hours as needed for Muscle spasms. 30 tablet 0     No current facility-administered medications for this visit.       No Known Allergies    SUBJECTIVE     Chief Complaint   Patient presents with   . Back Pain        Back  Pain  The current episode started in the past 7 days. The problem occurs constantly. Pertinent negatives include no abdominal pain, chest pain, dysuria, fever or headaches.       ROS     Review of Systems   Constitutional: Negative for fever and chills.   Respiratory: Negative for chest tightness.    Cardiovascular: Negative for chest pain and palpitations.   Gastrointestinal: Negative for abdominal pain.   Genitourinary: Negative for dysuria and urgency.   Musculoskeletal: Positive for back pain (right flank area).   Neurological: Negative for dizziness, light-headedness and headaches.   Psychiatric/Behavioral: Negative.        The following portions of the patient's history were reviewed and updated as appropriate: Allergies, Current Medications, Past Family History, Past Medical history, Past social history, Past surgical history, and Problem List.    PHYSICAL EXAM     Filed Vitals:    07/23/15 1644   BP: 177/57   Pulse: 60   Resp: 16   Weight: 62.143 kg (137 lb)   SpO2: 93%       Physical Exam   Constitutional: He is oriented to person, place, and time. He appears well-developed and well-nourished. No distress.   Cardiovascular: Normal rate and regular rhythm.    Pulmonary/Chest: Effort normal and breath sounds normal.   Abdominal: Soft. There is no tenderness. There is no CVA tenderness.   Musculoskeletal:        Lumbar back: He exhibits decreased range of motion and pain. He exhibits no bony tenderness, no swelling, no edema, no deformity, no laceration, no spasm and normal pulse.   Lumbar radiates right flank and sometimes down both legs.   Neurological: He is alert and oriented to person, place, and time.   Skin: Skin is warm and dry. He is not diaphoretic.   Psychiatric: He has a normal mood and affect. His behavior is normal.     Ortho Exam  Neurologic Exam     Mental Status   Oriented to person, place, and time.  PROCEDURE(S)     Procedures        Signed,  Marlyce Huge, FNP  07/23/2015

## 2015-07-28 ENCOUNTER — Other Ambulatory Visit (INDEPENDENT_AMBULATORY_CARE_PROVIDER_SITE_OTHER): Payer: Self-pay | Admitting: Family Medicine

## 2015-07-28 MED ORDER — BUMETANIDE 1 MG PO TABS
1.0000 mg | ORAL_TABLET | Freq: Two times a day (BID) | ORAL | Status: DC
Start: 2015-07-28 — End: 2016-08-12

## 2015-07-28 NOTE — Telephone Encounter (Signed)
Patient needs a refill of his bumetanide (BUMEX)

## 2015-08-04 ENCOUNTER — Encounter (INDEPENDENT_AMBULATORY_CARE_PROVIDER_SITE_OTHER): Payer: Self-pay | Admitting: Family Medicine

## 2015-08-04 ENCOUNTER — Ambulatory Visit (INDEPENDENT_AMBULATORY_CARE_PROVIDER_SITE_OTHER): Payer: Medicare Other | Admitting: Family Medicine

## 2015-08-04 VITALS — BP 174/61 | HR 43 | Temp 98.2°F | Resp 16 | Wt 135.0 lb

## 2015-08-04 DIAGNOSIS — E1122 Type 2 diabetes mellitus with diabetic chronic kidney disease: Secondary | ICD-10-CM

## 2015-08-04 DIAGNOSIS — Z794 Long term (current) use of insulin: Secondary | ICD-10-CM

## 2015-08-04 DIAGNOSIS — IMO0002 Reserved for concepts with insufficient information to code with codable children: Secondary | ICD-10-CM

## 2015-08-04 DIAGNOSIS — M1990 Unspecified osteoarthritis, unspecified site: Secondary | ICD-10-CM

## 2015-08-04 DIAGNOSIS — I150 Renovascular hypertension: Secondary | ICD-10-CM | POA: Insufficient documentation

## 2015-08-04 DIAGNOSIS — N184 Chronic kidney disease, stage 4 (severe): Secondary | ICD-10-CM

## 2015-08-04 DIAGNOSIS — I5032 Chronic diastolic (congestive) heart failure: Secondary | ICD-10-CM

## 2015-08-04 DIAGNOSIS — E1165 Type 2 diabetes mellitus with hyperglycemia: Secondary | ICD-10-CM

## 2015-08-04 HISTORY — DX: Renovascular hypertension: I15.0

## 2015-08-04 MED ORDER — INSULIN PEN NEEDLE 32G X 4 MM MISC
Status: DC
Start: 2015-08-04 — End: 2015-08-04

## 2015-08-04 MED ORDER — INSULIN GLARGINE 100 UNIT/ML SC SOPN
8.0000 [IU] | PEN_INJECTOR | Freq: Every evening | SUBCUTANEOUS | Status: DC
Start: 2015-08-04 — End: 2016-08-23

## 2015-08-04 MED ORDER — DICLOFENAC SODIUM 1 % TD GEL
Freq: Four times a day (QID) | TRANSDERMAL | Status: DC
Start: 2015-08-04 — End: 2016-05-26

## 2015-08-04 MED ORDER — NIFEDIPINE ER 90 MG PO TB24
90.0000 mg | ORAL_TABLET | Freq: Every day | ORAL | Status: DC
Start: 2015-08-04 — End: 2016-08-12

## 2015-08-04 MED ORDER — INSULIN PEN NEEDLE 31G X 5 MM MISC
Status: DC
Start: 2015-08-04 — End: 2016-09-24

## 2015-08-04 NOTE — Progress Notes (Signed)
Marco Bruce     male     1926/05/07     Chief Complaint   Patient presents with   . Arthritis     knee pain-bilateral, follow up   . Leg Swelling     f/u heart failure       HPI   Patient is here with his daughter today for an overall follow up. Patient complaining of chronic knee pain, bilateral, described by daughter as a stinging sensation which radiates to the lower legs. Patient takes tylenol and flexeril which helps his back but not his knees. Daughter states he went to see orthopedist last week and was told the doctor was going to order a cream but they are unsure if insurance will pay or not.  Patient also needs a refill of his lantus, which he takes 8 units daily.  Fatigue is improved and energy levels are up but knee pain is very bothersome.    Current Outpatient Prescriptions   Medication Sig Dispense Refill   . atorvastatin (LIPITOR) 40 MG tablet Take 1 tablet (40 mg total) by mouth every evening. 30 tablet 3   . bumetanide (BUMEX) 1 MG tablet Take 1 tablet (1 mg total) by mouth 2 (two) times daily. 60 tablet 3   . cyclobenzaprine (FLEXERIL) 10 MG tablet Take 1 tablet (10 mg total) by mouth every 8 (eight) hours as needed for Muscle spasms. 30 tablet 0   . docusate sodium (COLACE) 100 MG capsule Take 100 mg by mouth 2 (two) times daily.     Marland Kitchen glucose blood (TRUE METRIX BLOOD GLUCOSE TEST) test strip 1 each by Other route 2 (two) times daily. Use as instructed 200 each PRN   . hydrALAZINE (APRESOLINE) 100 MG tablet Take 1 tablet (100 mg total) by mouth 3 (three) times daily. 90 tablet 3   . insulin glargine 100 UNIT/ML injection pen Inject 8 Units into the skin nightly. 5 pen 12   . isosorbide mononitrate (IMDUR) 120 MG 24 hr tablet Take 120 mg by mouth.     Marland Kitchen SITagliptin (JANUVIA) 50 MG tablet Take 50 mg by mouth daily. 1/2 tablet daily     . diclofenac sodium (VOLTAREN) 1 % Gel topical gel Apply topically 4 (four) times daily. LE-apply 4g to affected area 4 times a day - Max 16mg /day 1 Tube 3   . Insulin  Pen Needle 31G X 5 MM Misc INJECT INSULIN DAILY AS DIRECTED 100 each 12   . NIFEdipine (ADALAT CC) 90 MG 24 hr tablet Take 1 tablet (90 mg total) by mouth daily. 30 tablet 6     No current facility-administered medications for this visit.       Past Medical History   Diagnosis Date   . Diabetes mellitus    . Hypertension    . Congestive heart failure (CHF)        Past Surgical History   Procedure Laterality Date   . Abdominal surgery         History reviewed. No pertinent family history.     reports that he has quit smoking. He does not have any smokeless tobacco history on file. He reports that he does not drink alcohol or use illicit drugs.    No Known Allergies    The following portions of the patient's history were reviewed and updated as appropriate: allergies, current medications, past family history, past medical history, past social history, past surgical history and problem list.    Filed Vitals:  08/04/15 1730   BP: 174/61   Pulse: 43   Temp: 98.2 F (36.8 C)   TempSrc: Oral   Resp: 16   Weight: 61.236 kg (135 lb)   SpO2: 97%        REVIEW OF SYSTMEMS    Review of Systems   Constitutional: Negative for fever, chills and malaise/fatigue.   HENT: Negative for congestion.    Eyes: Negative for pain and discharge.   Respiratory: Negative for cough and sputum production.    Cardiovascular: Negative for chest pain and palpitations.   Gastrointestinal: Negative for vomiting and abdominal pain.   Genitourinary: Negative for dysuria and urgency.   Musculoskeletal: Positive for joint pain. Negative for myalgias, back pain, falls and neck pain.   Skin: Negative for itching and rash.   Neurological: Negative for dizziness, loss of consciousness and headaches.   Psychiatric/Behavioral: Negative for depression, suicidal ideas and hallucinations.        Physical ExamConstitutional: He is oriented to person, place, and time and well-developed, well-nourished, and in no distress.   HENT:   Head: Normocephalic and  atraumatic.   Eyes: Conjunctivae and EOM are normal. Pupils are equal, round, and reactive to light.   Neck: Normal range of motion. Neck supple.     Cardiovascular: Normal rate, regular rhythm and S1 normal.    Murmur heard.   Systolic murmur is present with a grade of 2/6   Pulses:       Carotid pulses are 2+ on the right side, and 2+ on the left side.      Pulmonary/Chest: Effort normal, clear bl   Abdominal: Soft. Bowel sounds are normal.   Musculoskeletal: Normal range of motion.   BL LOWER EXT EDEMA HAS MARKEDLY IMPROVED ON COMPRESSION STOCKINGS   Neurological: He is alert and oriented to person, place, and time. Gait normal.   Skin: Skin is warm and dry.   Psychiatric: Mood and affect normal.       Encounter Diagnoses   Name Primary?   Marland Kitchen Uncontrolled type 2 diabetes mellitus with stage 4 chronic kidney disease, with long-term current use of insulin Yes   . Chronic diastolic heart failure    . Arthritis    . Renovascular hypertension           [name] was seen today for arthritis and leg swelling.    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  -     Discontinue: Insulin Pen Needle (BD PEN NEEDLE NANO U/F) 32G X 4 MM Misc; INJECT INSULIN NIGHTLY  as directed.  -     Insulin Pen Needle 31G X 5 MM Misc; INJECT INSULIN DAILY AS DIRECTED    PER PATIENT ACUCHECK AT HOME SHOWS GLUCOSE EARLY IN AM AROUND 110, 3 HRS AFTER LUNCH IS AROUND 130'S   ON LANTUS 8 UNITS PREVIOUSLY WAS ON 10 IN [place]     CONTINUE SAME DOSE, LAST A1C IN 2/22 WAS 9.6, + STATIN + NOT ON ACE INH     Chronic diastolic heart failure  -     Ambulatory referral to Nephrology    STABLE ON BUMEX     Arthritis  -     diclofenac sodium (VOLTAREN)  1 % Gel topical gel; Apply topically 4 (four) times daily. LE-apply 4g to  affected area 4 times a day - Max 16mg /day  -     Ambulatory referral to Nephrology    AVOID SYSTEMIC ORAL AGENT DUE TO CKD  START TRIAL OF VOLTAREN GEL, EXPLAINED TO DAUGHTER IT MAY NOT BE COVERED BY MEDICARE     Renovascular hypertension  -     NIFEdipine (ADALAT CC) 90 MG 24 hr tablet; Take 1 tablet (90 mg total) by mouth daily.    GOAL 140/90 OR LESS  PT 'S BP IS STILL UNCONTROLLED     INCREASE NIFEDIPINE FROM 60 TO 90 MG ER DAILY + IMDUR + HYDRALAZINE     SEND TO NEPHROLOGY FOR FURTHER MANAGEMENT AND EVALUATION OF CKD         Return in about 3 months (around 11/04/2015).   OVERALL FOLLOW UP                   [place], MD  [facility]

## 2015-08-22 ENCOUNTER — Other Ambulatory Visit (INDEPENDENT_AMBULATORY_CARE_PROVIDER_SITE_OTHER): Payer: Self-pay | Admitting: Family Medicine

## 2015-08-22 DIAGNOSIS — M545 Low back pain, unspecified: Secondary | ICD-10-CM

## 2015-08-22 NOTE — Telephone Encounter (Signed)
LOV 08/04/15.Marland KitchenMarland KitchenMarland KitchenPreviously rx'd by [facility]

## 2015-08-23 MED ORDER — CYCLOBENZAPRINE HCL 10 MG PO TABS
10.0000 mg | ORAL_TABLET | Freq: Three times a day (TID) | ORAL | Status: DC | PRN
Start: 2015-08-23 — End: 2016-08-23

## 2015-09-06 ENCOUNTER — Encounter (INDEPENDENT_AMBULATORY_CARE_PROVIDER_SITE_OTHER): Payer: Self-pay

## 2015-09-06 ENCOUNTER — Ambulatory Visit (INDEPENDENT_AMBULATORY_CARE_PROVIDER_SITE_OTHER): Payer: Medicare Other

## 2015-09-06 VITALS — BP 161/68 | HR 49 | Temp 98.5°F | Resp 18 | Wt 135.0 lb

## 2015-09-06 DIAGNOSIS — M542 Cervicalgia: Secondary | ICD-10-CM

## 2015-09-06 MED ORDER — HYDROCODONE-ACETAMINOPHEN 5-325 MG PO TABS
1.0000 | ORAL_TABLET | Freq: Three times a day (TID) | ORAL | Status: DC | PRN
Start: 2015-09-06 — End: 2016-08-12

## 2015-09-06 NOTE — Progress Notes (Signed)
[place] [facility]    PROGRESS NOTE      Patient: Marco Bruce   Date: 09/06/2015   MRN: [id number]     Past Medical History   Diagnosis Date   . Diabetes mellitus    . Hypertension    . Congestive heart failure (CHF)      Social History     Social History   . Marital Status: Married     Spouse Name: N/A   . Number of Children: N/A   . Years of Education: N/A     Occupational History   . Not on file.     Social History Main Topics   . Smoking status: Former Research scientist (life sciences)   . Smokeless tobacco: Not on file   . Alcohol Use: No   . Drug Use: No   . Sexual Activity: Not on file     Other Topics Concern   . Not on file     Social History Narrative     History reviewed. No pertinent family history.    ASSESSMENT/PLAN     Marco Bruce is a 82 y.o. male    Chief Complaint   Patient presents with   . Neck Pain        1. Neck pain  - HYDROcodone-acetaminophen (NORCO) 5-325 MG per tablet; Take 1 tablet by mouth every 8 (eight) hours as needed for Pain.  Dispense: 10 tablet; Refill: 0       This is a chronic issue. Patient has hx of cervical arthritis. Recommend continue flexeril (which Dr Earley Favor refilled yesterday), extra strength tylenol,  and may use norco (1 tablet) for breakthrough pain. Patient's daughter declined lidoderm patches as she says "he says they dont help" Discussed with patient and daughter that these chronic degenerative issues with his neck are not going to get 100 % better. This treatment is aimed at managing symptoms. Also discussed limitations of medications due to kidney function. Recommend follow up with Dr Earley Favor next week if pain has not improved at all. May need pain management. Patient in agreement with plan, all questions answered.       Results for orders placed or performed in visit on 07/23/15   POCT UA Dipstix (10)(Multi-Test)   Result Value Ref Range    POCT Spec Gravity, UA 1.015 1.001 - 1.035    POCT pH, UA 5.5 5 - 8    Glucose, UA POCT Negative Negative mg/dL    Protein, UA POCT =30 (A) Negative mg/dL     Ketones, UA POCT Negative Negative mg/dL    Blood, UA POCT Negative Negative, Trace    POCT Leukocytes, UA Negative Negative    Nitrite, UA POCT Negative Negative    Bilirubin, UA POCT Negative Negative    Urobilinogen, UA 0.2 0.2, 1.0, 2.0 mg/dL       Risk & Benefits of the new medication(s) were explained to the patient (and family) who verbalized understanding & agreed to the treatment plan. Patient (family) encouraged to contact me/clinical staff with any questions/concerns      MEDICATIONS     Current Outpatient Prescriptions   Medication Sig Dispense Refill   . acetaminophen (TYLENOL) 500 MG tablet Take 1,000 mg by mouth every 6 (six) hours as needed for Pain.     Marland Kitchen atorvastatin (LIPITOR) 40 MG tablet Take 1 tablet (40 mg total) by mouth every evening. 30 tablet 3   . bumetanide (BUMEX) 1 MG tablet Take 1 tablet (1 mg total) by mouth  2 (two) times daily. 60 tablet 3   . cyclobenzaprine (FLEXERIL) 10 MG tablet Take 1 tablet (10 mg total) by mouth every 8 (eight) hours as needed for Muscle spasms. 30 tablet 3   . diclofenac sodium (VOLTAREN) 1 % Gel topical gel Apply topically 4 (four) times daily. LE-apply 4g to affected area 4 times a day - Max 16mg /day 1 Tube 3   . docusate sodium (COLACE) 100 MG capsule Take 100 mg by mouth 2 (two) times daily.     Marland Kitchen glucose blood (TRUE METRIX BLOOD GLUCOSE TEST) test strip 1 each by Other route 2 (two) times daily. Use as instructed 200 each PRN   . hydrALAZINE (APRESOLINE) 100 MG tablet Take 1 tablet (100 mg total) by mouth 3 (three) times daily. 90 tablet 3   . insulin glargine 100 UNIT/ML injection pen Inject 8 Units into the skin nightly. 5 pen 12   . Insulin Pen Needle 31G X 5 MM Misc INJECT INSULIN DAILY AS DIRECTED 100 each 12   . isosorbide mononitrate (IMDUR) 120 MG 24 hr tablet Take 120 mg by mouth.     Marland Kitchen NIFEdipine (ADALAT CC) 90 MG 24 hr tablet Take 1 tablet (90 mg total) by mouth daily. 30 tablet 6   . SITagliptin (JANUVIA) 50 MG tablet Take 50 mg by mouth  daily. 1/2 tablet daily     . HYDROcodone-acetaminophen (NORCO) 5-325 MG per tablet Take 1 tablet by mouth every 8 (eight) hours as needed for Pain. 10 tablet 0     No current facility-administered medications for this visit.       No Known Allergies    SUBJECTIVE     Chief Complaint   Patient presents with   . Neck Pain        Neck Pain   This is a new problem. Episode onset: 2 days ago. The problem occurs constantly. The pain is associated with nothing. Pain location: left side of anterior neck to posterior neck. Quality: sharp. Pain scale: 10/10 on side of neck. Exacerbated by: not worse with movement, but cannot turn neck when lying down. The pain is worse during the night. Pertinent negatives include no fever, headaches, numbness, tingling or weakness. He has tried acetaminophen and muscle relaxants for the symptoms. The treatment provided no relief.       ROS     Review of Systems   Constitutional: Negative for fever, chills and fatigue.   HENT: Negative.    Respiratory: Negative.    Cardiovascular: Negative.    Gastrointestinal: Negative.    Musculoskeletal: Positive for neck pain (no pain or numbness in shoulder, arm, or hand) and neck stiffness.        Left side of neck  And upper back     Skin: Negative for rash and wound.   Neurological: Negative for dizziness, tingling, weakness, numbness and headaches.       The following portions of the patient's history were reviewed and updated as appropriate: Allergies, Current Medications, Past Family History, Past Medical history, Past social history, Past surgical history, and Problem List.    PHYSICAL EXAM     Filed Vitals:    09/06/15 1521   BP: 161/68   Pulse: 49   Temp: 98.5 F (36.9 C)   TempSrc: Oral   Resp: 18   Weight: 61.236 kg (135 lb)   SpO2: 96%       Physical Exam   Constitutional: He appears well-developed and well-nourished. No distress.  Neck: Muscular tenderness present. No spinous process tenderness present. No rigidity. Decreased range of  motion present. No edema and no erythema present.       Increased pain with forward flexion or attempted hyperextension. Rotation equal bilaterally (about 45 degrees). Flexion 80%.    Cardiovascular: Regular rhythm and normal heart sounds.  Bradycardia present.    BP slightly elevated, possibly due to pain   Pulmonary/Chest: Effort normal and breath sounds normal.   Psychiatric: He has a normal mood and affect. His behavior is normal. Judgment and thought content normal.     Ortho Exam  Neurologic Exam    PROCEDURE(S)     Procedures        Signed,  [name], NP  09/06/2015

## 2015-09-18 ENCOUNTER — Telehealth (INDEPENDENT_AMBULATORY_CARE_PROVIDER_SITE_OTHER): Payer: Self-pay | Admitting: Family Medicine

## 2015-09-18 NOTE — Telephone Encounter (Signed)
[facility] pharmacist from [facility] is calling because an alert popped up for this patient's flexeril prescription due to his age and they would like to know if you want to switch it to baclofen or tizanidine. Please advise and have nurse call them back at [phone]. Thank you!

## 2015-09-18 NOTE — Telephone Encounter (Signed)
Please advise patient to stop flexeril as it increases the risks of somnolence and falling. If patient moved back to [place], patient should follow up with pcp locally.

## 2015-09-21 ENCOUNTER — Encounter (INDEPENDENT_AMBULATORY_CARE_PROVIDER_SITE_OTHER): Payer: Self-pay | Admitting: Internal Medicine

## 2015-09-21 NOTE — Telephone Encounter (Signed)
Gave information to patient's daughter, who verbalized understanding of Dr. Earley Favor wanting him to discontinue the flexeril. Patient does not live in [place] but is currently in [place]. I also called the pharmacy back to let them know Dr. Earley Favor wanted to cancel that prescription. Information given to pharmacist.

## 2015-10-29 NOTE — Telephone Encounter (Signed)
Open and error

## 2015-11-04 ENCOUNTER — Encounter (INDEPENDENT_AMBULATORY_CARE_PROVIDER_SITE_OTHER): Payer: Medicare Other | Admitting: Family Medicine

## 2015-11-04 NOTE — Progress Notes (Signed)
This encounter was created in error - please disregard.    Items noted as "reviewed" are for administrative purposes only and are not guaranteed by the provider to be accurate on this date.

## 2016-05-26 ENCOUNTER — Emergency Department: Payer: Medicare Other

## 2016-05-26 ENCOUNTER — Emergency Department
Admission: EM | Admit: 2016-05-26 | Discharge: 2016-05-26 | Disposition: A | Discharge status: Home / Self Care | Payer: Medicare Other | Attending: Emergency Medicine | Admitting: Emergency Medicine

## 2016-05-26 DIAGNOSIS — Z87891 Personal history of nicotine dependence: Secondary | ICD-10-CM | POA: Insufficient documentation

## 2016-05-26 DIAGNOSIS — I509 Heart failure, unspecified: Secondary | ICD-10-CM | POA: Insufficient documentation

## 2016-05-26 DIAGNOSIS — Z79899 Other long term (current) drug therapy: Secondary | ICD-10-CM | POA: Insufficient documentation

## 2016-05-26 DIAGNOSIS — M170 Bilateral primary osteoarthritis of knee: Secondary | ICD-10-CM | POA: Insufficient documentation

## 2016-05-26 DIAGNOSIS — Z794 Long term (current) use of insulin: Secondary | ICD-10-CM | POA: Insufficient documentation

## 2016-05-26 DIAGNOSIS — E119 Type 2 diabetes mellitus without complications: Secondary | ICD-10-CM | POA: Insufficient documentation

## 2016-05-26 DIAGNOSIS — I110 Hypertensive heart disease with heart failure: Secondary | ICD-10-CM | POA: Insufficient documentation

## 2016-05-26 MED ORDER — OXYCODONE-ACETAMINOPHEN 5-325 MG PO TABS
1.0000 | ORAL_TABLET | ORAL | 0 refills | Status: DC | PRN
Start: 2016-05-26 — End: 2016-08-12

## 2016-05-26 MED ORDER — IBUPROFEN 600 MG PO TABS
600.0000 mg | ORAL_TABLET | Freq: Once | ORAL | Status: DC
Start: 2016-05-26 — End: 2016-05-26

## 2016-05-26 MED ORDER — LIDOCAINE 5 % EX PTCH
1.0000 | MEDICATED_PATCH | Freq: Every day | CUTANEOUS | 0 refills | Status: DC
Start: 2016-05-26 — End: 2017-01-04

## 2016-05-26 MED ORDER — OXYCODONE-ACETAMINOPHEN 5-325 MG PO TABS
1.0000 | ORAL_TABLET | Freq: Once | ORAL | Status: AC
Start: 2016-05-26 — End: 2016-05-26
  Administered 2016-05-26: 1 via ORAL
  Filled 2016-05-26: qty 1

## 2016-05-26 MED ORDER — DICLOFENAC SODIUM 25 MG PO TBEC
25.0000 mg | DELAYED_RELEASE_TABLET | Freq: Two times a day (BID) | ORAL | 0 refills | Status: AC
Start: 2016-05-26 — End: 2016-06-05

## 2016-05-26 NOTE — Discharge Instructions (Signed)
Dear [name]:    Thank you for choosing the [facility] Emergency Department, the premier emergency department in the [place] area.  I hope your visit today was EXCELLENT.    Specific instructions for your visit today:      Degenerative Joint Disease    You have been diagnosed with degenerative joint disease (DJD).    Healthy joints have cartilage or soft bone that works like a cushion. When the cartilage wears down, DJD develops.     Symptoms of DJD include clicking or grinding in the joint or pain that gets worse when the joint is used. DJD can occur anywhere but it is most common in the knees, hips and shoulders.     Taking medication as prescribed will help control your pain.    YOU SHOULD SEEK MEDICAL ATTENTION IMMEDIATELY, EITHER HERE OR AT THE NEAREST EMERGENCY DEPARTMENT, IF ANY OF THE FOLLOWING OCCURS:   The joint looks swollen or red.   Your joint hurts too much to use. (For example, if your knee hurts too much to walk on it).   You have fever (temperature higher than 100.36F / 38C), or shaking chills.             Osteoarthritis     You were diagnosed with osteoarthritis.    Osteoarthritis (OA) is a type of problem with the joints where they start to suffer from wear and tear, as you get older. It is also called "degenerative arthritis" or "degenerative joint disease."    Normally, there is a tough, rubbery layer of cartilage on the ends of each of the bones that makes up a joint. The cartilage is there to protect the bone and to provide a cushioned, slippery surface. This way, the bones can move smoothly when the joint is bending. When the cartilage gets worn, the bone ends are not as well protected. As the exposed bones rub against each other when the joint bends, it causes irritation, pain and swelling. Any joint can be affected. However, OA is most common in the spine, hands, hips, knees, and feet.     OA is more common as a person gets older. Aging itself does not cause OA. However,  changes in the body that happen when one gets older make the chance of getting OA more likely.     Other things that can speed up the development of OA are:     Repetitive activity that involves constantly stressing the joints.   Being overweight. This puts more wear and tear on the joints.   Family history of OA. OA tends to run in families.   Fractures that extend into the joint and cause injury to the cartilage surfaces.     Symptoms of OA can include:   Joint pain. This is usually made worse with heavy activity and repetitive bending.   Stiffness in the joint when bending and moving the joint. This is usually worse in the morning. It gets better with activity as the joint "warms up."   Swelling. Joint swelling can happen when fluid seeps into the joint from chronic irritation.   Crackling and popping noises when the joint is moved.   Lumps of enlarged bone that can develop on the fingers.    There is no cure for OA. The treatment of OA is designed to control pain and slow down the wear and tear process. Some things that are used to treat OA include:     Pain medications: Acetaminophen (  Tylenol) and medications called NSAIDS like ibuprofen (Advil/Motrin) are often used first for pain control.     Steroid injections: This can cause temporary relief of pain. However, repeated injections into the joints can lead to more joint problems.     Life style changes: This includes weight loss and getting involved in exercise programs to help strengthen muscles around painful joints without making the wear and tear worse.     Physical therapy (PT): This may help to build strength and decrease joint stiffness.     In more severe cases, joint replacement surgery may be an option if symptoms can't be controlled using conventional measures.    There does NOT seem to be any evidence that "alternative treatments" like vitamins (A, E, and C), glucosamine, ginger, chondroitin sulfate and others have any  helpful effects. ALWAYS talk to your doctor if you are going to try any alternative treatments.     Take all prescribed medications as directed. Unless you are told something different by your doctors, you can take over-the-counter pain medicines. These medicines can include ibuprofen (Advil/Motrin) or acetaminophen (Tylenol). Follow the directions on the package.    Get involved in any physical therapy recommended by your doctor.    YOU SHOULD SEEK MEDICAL ATTENTION IMMEDIATELY, EITHER HERE OR AT THE NEAREST EMERGENCY DEPARTMENT, IF ANY OF THE FOLLOWING OCCUR:   You have any of the above symptoms and are unable to see or contact your doctor.   You get a fever (temperature higher than 100.96F or 38C) that is associated with pain or redness over your affected joint.   You have severe swelling in your painful joint.    If you can't follow up with your doctor, or if at any time you feel you need to be rechecked or seen again, come back here or go to the nearest emergency department.                 If you do not continue to improve or your condition worsens, please contact your doctor or return immediately to the Emergency Department.    Sincerely,  Verlee Monte, MD  Attending Emergency Physician  [facility] Emergency Department    ONSITE PHARMACY  Our full service onsite pharmacy is located in the ER waiting room.  Open 7 days a week from 9 am to 11 pm.  We accept all major insurances and prices are competitive with major retailers.  Ask your provider to print your prescriptions down to the pharmacy to speed you on your way home.    OBTAINING A PRIMARY CARE APPOINTMENT    Primary care physicians (PCPs, also known as primary care doctors) are either internists or family medicine doctors. Both types of PCPs focus on health promotion, disease prevention, patient education and counseling, and treatment of acute and chronic medical conditions.    Call for an appointment with a primary care  doctor.  Ask to see who is taking new patients.     [employer]  telephone:  [phone]  [email or web address]    DOCTOR REFERRALS  Call [phone] (available 24 hours a day, 7 days a week) if you need any further referrals and we can help you find a primary care doctor or specialist.  Also, available online at:  [email or web address]    YOUR CONTACT INFORMATION  Before leaving please check with registration to make sure we have an up-to-date contact number.  You can call registration at [phone] to update your information.  For  questions about your hospital bill, please call [phone].  For questions about your Emergency Dept Physician bill please call [phone].      Marco Bruce  If you need help with health or social services, please call 2-1-1 for a free referral to resources in your area.  2-1-1 is a free service connecting people with information on health insurance, free clinics, pregnancy, mental health, dental care, food assistance, housing, and substance abuse counseling.  Also, available online at:  http://www.[facility].org    MEDICAL RECORDS AND TESTS  Certain laboratory test results do not come back the same day, for example urine cultures.   We will contact you if other important findings are noted.  Radiology films are often reviewed again to ensure accuracy.  If there is any discrepancy, we will notify you.      Please call [phone] to pick up a complimentary CD of any radiology studies performed.  If you or your doctor would like to request a copy of your medical records, please call [phone].      ORTHOPEDIC INJURY   Please know that significant injuries can exist even when an initial x-ray is read as normal or negative.  This can occur because some fractures (broken bones) are not initially visible on x-rays.  For this reason, close outpatient follow-up with your primary care doctor or bone specialist (orthopedist) is  required.    MEDICATIONS AND FOLLOWUP  Please be aware that some prescription medications can cause drowsiness.  Use caution when driving or operating machinery.    The examination and treatment you have received in our Emergency Department is provided on an emergency basis, and is not intended to be a substitute for your primary care physician.  It is important that your doctor checks you again and that you report any new or remaining problems at that time.      [place]  The nearest 24 hour pharmacy is:    [facility] [place] [place], [place] [address]  [place]  [facility])  Call to start or finish an application, compare plans, enroll or ask a question.  [place]: [phone]  Web:  Healthcare.gov    Help Enrolling in [place]  272-408-8223 (TOLL-FREE)  [phone])876-4801 (TTY)  Web:  Http://www.coverva.org    Local Help Enrolling in the [place]  [phone] (MAIN)  Email:  health-help@nvfs .org  Web:  http://lewis-perez.info/  Address:  [address] [place], [place] [address]    SEDATING MEDICATIONS  Sedating medications include strong pain medications (e.g. narcotics), muscle relaxers, benzodiazepines (used for anxiety and as muscle relaxers), Benadryl/diphenhydramine and other antihistamines for allergic reactions/itching, and other medications.  If you are unsure if you have received a sedating medication, please ask your physician or nurse.  If you received a sedating medication: DO NOT drive a car. DO NOT operate machinery. DO NOT perform jobs where you need to be alert.  DO NOT drink alcoholic beverages while taking this medicine.     If you get dizzy, sit or lie down at the first signs. Be careful going up and down stairs.  Be extra careful to prevent falls.     Never give this medicine to others.     Keep this medicine out of reach of children.      Do not take or  save old medicines. Throw them away when outdated.     Keep all medicines in a cool, dry place. DO NOT keep them in your bathroom medicine cabinet or in a cabinet above the stove.    MEDICATION REFILLS  Please be aware that we cannot refill any prescriptions through the ER. If you need further treatment from what is provided at your ER visit, please follow up with your primary care doctor or your pain management specialist.    [place]  Did you know [facility] has two freestanding ERs located just a few miles away?  [facility] [place] have short wait times, easy free parking directly in front of the building and top patient satisfaction scores - and the same Board Certified Emergency Medicine doctors as [facility].

## 2016-05-26 NOTE — ED Provider Notes (Addendum)
Date Time: 05/26/16 7:54 PM  Patient Name: [facility]  Attending Physician: Verlee Monte, MD    Attending Note:   I have reviewed and agree with the history. The pertinent physical exam has been documented.    Selected historical findings: 83 y.o. male with a h/o CHF; DM; HTN p/w intermittent moderate left knee pain a/w swelling onset 2 months ago. Also c/o milder bilateral shoulder and right knee pain. Took diclofenac and percocet to minimal improvement. Denies injury.     Case discussed with PA - I agree with plan of care.    Chronic pain c/w DJD.    NOte - I did not see the patient before discharge as I was with a critically ill patient.                                                  I was acting as a Education administrator for Verlee Monte, MD on [facility]  Treatment Team: Scribe: Mahala Menghini   I am the first attending provider for this patient and I personally performed the services documented. Treatment Team: Scribe: Mahala Menghini is scribing for me on [facility]. This note accurately reflects work and decisions made by me.  Verlee Monte, MD       Verlee Monte, MD  05/27/16 1523       Verlee Monte, MD  05/27/16 [phone]

## 2016-05-26 NOTE — ED Provider Notes (Signed)
[facility]      Visit date: 05/26/2016      CLINICAL SUMMARY          Diagnosis:    .     Final diagnoses:   Localized osteoarthritis of knees, bilateral         MDM Notes:      DD: DJD, autoimmune disease, Lymes, gout ( not likely).  Rule out fracture. GEt x ray of knees and follow up with PCP for further testing  With ANA, RF.        Disposition:          ED Disposition     ED Disposition Condition Date/Time Comment    Discharge  Wed May 26, 2016  7:52 PM Marco Bruce discharge to home/self care.    Condition at disposition: Stable                     CLINICAL INFORMATION        HPI:      Chief Complaint: Joint Pain  .    Marco Bruce is a 83 y.o. male that  has a past medical history of Congestive heart failure (CHF); Diabetes mellitus; and Hypertension.  Presents with main complaint of left knee pain on and off x 2 months. Waxes and wanes.  No injury or fever. + swelling on and off.  Pt says no relief with pain medication diclofenac and percocet.  No swelling in lower legs. He also complains about shoulders and right knee pain as well but not at bad.  No tick bites or exposure. No n/v/d.  No rash.       History obtained from: Patient and son as translator          ROS:      Positive and negative ROS elements as per HPI.  All other systems reviewed and negative.      Physical Exam:      Pulse 78  BP (!) 137/100  Resp 20  SpO2 99 %  Temp 98.6 F (37 C)            Constitutional: Vital signs reviewed. Alert and oriented x 3     Head: Normocephalic, atraumatic  Eyes: PERRLA, EOMI bilateral, Conjunctiva Normal,  No discharge. Lids without swelling or lesions  Ears: Normal pinna, EAC patent, TMs normal and intact  Nose: Symmetrical, Mucous membranes normal, No septal changes  Throat: Tonsils without swelling, exudate or erythema.  Mouth:  MM moist, no lesions or masses. Good dentition.  Neck: Normal range of motion. Non-tender. No thyroid enlargement or  mass  Respiratory/Chest: Clear to auscultation bilateral. No respiratory distress.   Cardiovascular: Heart: Regular rate and rhythm. No murmur/gallops or rubs. Pulses: + 2 bilateral radial and Dorsalis pedis. Cap refill less than 3 sec.    Abdomen: Soft and non-tender. No guarding. No masses or hepatosplenomegaly. Normo-active bowel sounds.   UpperExtremity: No edema or cyanosis.  LowerExtremity: No edema. No cyanosis.  Neurological: CN II-XII grossly intact.  No focal motor deficits by observation. Speech normal. Sensory Intact to light touch all distal extremeties.  Msk: Tenderness to bilateral knees Left > right.  No swelling, warmth or erythema. Other joints. Non tender with full RoM with some pain to shoulders.   Back:  Full ROM, no midline or muscle tenderness, No deformity or masses.     Skin: Warm and dry. No rash. Good turgor.   Psychiatric: Normal affect. Normal concentration.  PAST HISTORY        Primary Care Provider: Earl Many, MD        PMH/PSH:    .     Past Medical History:   Diagnosis Date   . Congestive heart failure (CHF)    . Diabetes mellitus    . Hypertension        He has a past surgical history that includes Abdominal surgery.      Social/Family History:      He reports that he has quit smoking. He has never used smokeless tobacco. He reports that he does not drink alcohol or use drugs.    No family history on file.      Listed Medications on Arrival:    .     Home Medications             acetaminophen (TYLENOL) 500 MG tablet     Take 1,000 mg by mouth every 6 (six) hours as needed for Pain.     atorvastatin (LIPITOR) 40 MG tablet     Take 1 tablet (40 mg total) by mouth every evening.     bumetanide (BUMEX) 1 MG tablet     Take 1 tablet (1 mg total) by mouth 2 (two) times daily.     cyclobenzaprine (FLEXERIL) 10 MG tablet     Take 1 tablet (10 mg total) by mouth every 8 (eight) hours as needed for Muscle spasms.     docusate sodium (COLACE) 100 MG capsule     Take 100 mg by  mouth 2 (two) times daily.     glucose blood (TRUE METRIX BLOOD GLUCOSE TEST) test strip     1 each by Other route 2 (two) times daily. Use as instructed     hydrALAZINE (APRESOLINE) 100 MG tablet     Take 1 tablet (100 mg total) by mouth 3 (three) times daily.     HYDROcodone-acetaminophen (NORCO) 5-325 MG per tablet     Take 1 tablet by mouth every 8 (eight) hours as needed for Pain.     insulin glargine 100 UNIT/ML injection pen     Inject 8 Units into the skin nightly.     Insulin Pen Needle 31G X 5 MM Misc     INJECT INSULIN DAILY AS DIRECTED     isosorbide mononitrate (IMDUR) 120 MG 24 hr tablet     Take 120 mg by mouth.     NIFEdipine (ADALAT CC) 90 MG 24 hr tablet     Take 1 tablet (90 mg total) by mouth daily.     SITagliptin (JANUVIA) 50 MG tablet     Take 50 mg by mouth daily. 1/2 tablet daily                    Allergies: He has No Known Allergies.            VISIT INFORMATION        Clinical Course in the ED:            Medications Given in the ED:    .     ED Medication Orders     Start Ordered     Status Ordering Provider    05/26/16 1954 05/26/16 1953  oxyCODONE-acetaminophen (PERCOCET) 5-325 MG per tablet 1 tablet  Once     Route: Oral  Ordered Dose: 1 tablet     Last MAR action:  Given Carleene Cooper    05/26/16 1954  05/26/16 1953    Once     Route: Oral  Ordered Dose: 600 mg     Discontinued Carleene Cooper            Procedures:      Procedures      Interpretations:                  RESULTS        Lab Results:      Results     ** No results found for the last 24 hours. **              Radiology Results:      Knee 4+ Views Left   Final Result      1. No acute findings or evidence of inflammatory arthritis.   2. Moderate degenerative arthritis medial compartment with evidence of   joint effusion.      Lupita Leash, MD    05/26/2016 7:42 PM      Knee 4+ Views Right   Final Result    No acute findings or inflammatory arthritis. Moderately   advanced degenerative arthritis medial  compartment.      Lupita Leash, MD    05/26/2016 7:40 PM                  Scribe Attestation:      No scribe involved in the care of this patient                            Carleene Cooper, [place]  05/29/16 [place], [place], [place]  05/29/16 [phone]

## 2016-05-26 NOTE — ED Provider Notes (Signed)
Triage note: Initial testing was ordered based on presenting complaint in order to expedite care. I am not the primary provider for this patient.   77M p/w b/l knee pains worsening over 6 mo, only when walking. No falls or trauma.     Guy Sandifer, MD  05/26/16 1800

## 2016-05-27 LAB — LYME AB, TOTAL,REFLEX TO WESTERN BLOT (IGG & IGM): Lyme AB,Total,Reflx to WB(IGM): 0.04 (ref 0.00–0.90)

## 2016-06-07 ENCOUNTER — Other Ambulatory Visit (INDEPENDENT_AMBULATORY_CARE_PROVIDER_SITE_OTHER): Payer: Self-pay | Admitting: Family Medicine

## 2016-06-07 DIAGNOSIS — M79605 Pain in left leg: Secondary | ICD-10-CM

## 2016-08-08 ENCOUNTER — Emergency Department: Payer: Medicare Other

## 2016-08-08 ENCOUNTER — Emergency Department
Admission: EM | Admit: 2016-08-08 | Discharge: 2016-08-08 | Disposition: A | Discharge status: Home / Self Care | Payer: Medicare Other | Attending: Emergency Medicine | Admitting: Emergency Medicine

## 2016-08-08 DIAGNOSIS — R030 Elevated blood-pressure reading, without diagnosis of hypertension: Secondary | ICD-10-CM

## 2016-08-08 DIAGNOSIS — Z794 Long term (current) use of insulin: Secondary | ICD-10-CM | POA: Insufficient documentation

## 2016-08-08 DIAGNOSIS — Z87891 Personal history of nicotine dependence: Secondary | ICD-10-CM | POA: Insufficient documentation

## 2016-08-08 DIAGNOSIS — M109 Gout, unspecified: Secondary | ICD-10-CM | POA: Insufficient documentation

## 2016-08-08 DIAGNOSIS — M25562 Pain in left knee: Secondary | ICD-10-CM | POA: Insufficient documentation

## 2016-08-08 DIAGNOSIS — R700 Elevated erythrocyte sedimentation rate: Secondary | ICD-10-CM | POA: Insufficient documentation

## 2016-08-08 DIAGNOSIS — E119 Type 2 diabetes mellitus without complications: Secondary | ICD-10-CM | POA: Insufficient documentation

## 2016-08-08 DIAGNOSIS — I110 Hypertensive heart disease with heart failure: Secondary | ICD-10-CM | POA: Insufficient documentation

## 2016-08-08 DIAGNOSIS — R7982 Elevated C-reactive protein (CRP): Secondary | ICD-10-CM

## 2016-08-08 DIAGNOSIS — D72829 Elevated white blood cell count, unspecified: Secondary | ICD-10-CM | POA: Insufficient documentation

## 2016-08-08 DIAGNOSIS — D649 Anemia, unspecified: Secondary | ICD-10-CM | POA: Insufficient documentation

## 2016-08-08 DIAGNOSIS — N289 Disorder of kidney and ureter, unspecified: Secondary | ICD-10-CM | POA: Insufficient documentation

## 2016-08-08 DIAGNOSIS — M25561 Pain in right knee: Secondary | ICD-10-CM | POA: Insufficient documentation

## 2016-08-08 DIAGNOSIS — Z79899 Other long term (current) drug therapy: Secondary | ICD-10-CM | POA: Insufficient documentation

## 2016-08-08 DIAGNOSIS — I509 Heart failure, unspecified: Secondary | ICD-10-CM | POA: Insufficient documentation

## 2016-08-08 LAB — COMPREHENSIVE METABOLIC PANEL
ALT: 10 U/L (ref 0–55)
AST (SGOT): 24 U/L (ref 5–34)
Albumin/Globulin Ratio: 1.5 (ref 0.9–2.2)
Albumin: 4.4 g/dL (ref 3.5–5.0)
Alkaline Phosphatase: 87 U/L (ref 38–106)
Anion Gap: 12 (ref 5.0–15.0)
BUN: 33 mg/dL — ABNORMAL HIGH (ref 9.0–28.0)
Bilirubin, Total: 0.4 mg/dL (ref 0.2–1.2)
CO2: 25 mEq/L (ref 22–29)
Calcium: 9.9 mg/dL (ref 7.9–10.2)
Chloride: 105 mEq/L (ref 100–111)
Creatinine: 2 mg/dL — ABNORMAL HIGH (ref 0.7–1.3)
Globulin: 3 g/dL (ref 2.0–3.6)
Glucose: 92 mg/dL (ref 70–100)
Potassium: 4.3 mEq/L (ref 3.5–5.1)
Protein, Total: 7.4 g/dL (ref 6.0–8.3)
Sodium: 142 mEq/L (ref 136–145)

## 2016-08-08 LAB — CBC AND DIFFERENTIAL
Absolute NRBC: 0 10*3/uL
Basophils Absolute Automated: 0.06 10*3/uL (ref 0.00–0.20)
Basophils Automated: 0.5 %
Eosinophils Absolute Automated: 2.19 10*3/uL — ABNORMAL HIGH (ref 0.00–0.70)
Eosinophils Automated: 16.6 %
Hematocrit: 33.5 % — ABNORMAL LOW (ref 42.0–52.0)
Hgb: 11.2 g/dL — ABNORMAL LOW (ref 13.0–17.0)
Immature Granulocytes Absolute: 0.04 10*3/uL
Immature Granulocytes: 0.3 %
Lymphocytes Absolute Automated: 2.24 10*3/uL (ref 0.50–4.40)
Lymphocytes Automated: 17 %
MCH: 30.9 pg (ref 28.0–32.0)
MCHC: 33.4 g/dL (ref 32.0–36.0)
MCV: 92.3 fL (ref 80.0–100.0)
MPV: 9.9 fL (ref 9.4–12.3)
Monocytes Absolute Automated: 0.99 10*3/uL (ref 0.00–1.20)
Monocytes: 7.5 %
Neutrophils Absolute: 7.67 10*3/uL (ref 1.80–8.10)
Neutrophils: 58.1 %
Nucleated RBC: 0 /100 WBC (ref 0.0–1.0)
Platelets: 251 10*3/uL (ref 140–400)
RBC: 3.63 10*6/uL — ABNORMAL LOW (ref 4.70–6.00)
RDW: 15 % (ref 12–15)
WBC: 13.19 10*3/uL — ABNORMAL HIGH (ref 3.50–10.80)

## 2016-08-08 LAB — SEDIMENTATION RATE: Sed Rate: 57 mm/Hr — ABNORMAL HIGH (ref 0–15)

## 2016-08-08 LAB — C-REACTIVE PROTEIN: C-Reactive Protein: 2.6 mg/dL — ABNORMAL HIGH (ref 0.0–0.8)

## 2016-08-08 LAB — GFR: EGFR: 31.7

## 2016-08-08 LAB — URIC ACID: Uric acid: 7.3 mg/dL (ref 3.6–9.7)

## 2016-08-08 MED ORDER — SODIUM CHLORIDE 0.9 % IV SOLN
INTRAVENOUS | Status: DC
Start: 2016-08-08 — End: 2016-08-08

## 2016-08-08 MED ORDER — ONDANSETRON HCL 4 MG/2ML IJ SOLN
4.0000 mg | Freq: Once | INTRAMUSCULAR | Status: AC
Start: 2016-08-08 — End: 2016-08-08
  Administered 2016-08-08: 02:00:00 4 mg via INTRAVENOUS
  Filled 2016-08-08: qty 2

## 2016-08-08 MED ORDER — OXYCODONE-ACETAMINOPHEN 5-325 MG PO TABS
2.0000 | ORAL_TABLET | Freq: Once | ORAL | Status: AC
Start: 2016-08-08 — End: 2016-08-08
  Administered 2016-08-08: 03:00:00 2 via ORAL
  Filled 2016-08-08: qty 2

## 2016-08-08 MED ORDER — FENTANYL CITRATE (PF) 50 MCG/ML IJ SOLN (WRAP)
25.0000 ug | Freq: Once | INTRAMUSCULAR | Status: AC
Start: 2016-08-08 — End: 2016-08-08
  Administered 2016-08-08: 02:00:00 25 ug via INTRAVENOUS
  Filled 2016-08-08: qty 2

## 2016-08-08 NOTE — ED Notes (Signed)
Awaiting PTS for ride home

## 2016-08-08 NOTE — Discharge Instructions (Signed)
Abnormal Laboratory Result    During your visit today, one of your tests was abnormal.    Abnormal laboratory results are common. Most of the time an incidental abnormal laboratory result is nothing serious. Your doctor today doesn't think anything needs to be done about your laboratory result right away. You might need another exam or more tests to find out why you have this abnormal lab result. At this time, the cause of laboratory results does not seem dangerous. You do not need to stay in the hospital.    We don't believe your condition is dangerous right now. However, you need to be careful. Sometimes a problem that seems small can get serious later. This is why it is very important to come back here or go to the nearest Emergency Department unless you are 100% improved.    In rare cases an abnormal laboratory result can be a sign of something serious or that you are developing an illness. This is why it's important for your primary doctor to keep a close eye on you. They will need to recheck your abnormal laboratory result.    Come back here or go to the nearest emergency department or follow-up with your doctor in:    1 day.    YOU SHOULD SEEK MEDICAL ATTENTION IMMEDIATELY, EITHER HERE OR AT THE NEAREST EMERGENCY DEPARTMENT, IF ANY OF THE FOLLOWING OCCURS:     You can't get your laboratory result rechecked in the time period your doctor recommended today.   You have a fever (temperature higher than 100.43F or 38C).    You get any other symptoms, concerns, or don't get better as expected.   You get worse or feel you can't wait until your follow-up appointment to get treated.    If you can't follow up with your doctor, or if at any time you feel you need to be rechecked or seen again, come back here or go to the nearest emergency department.               Arthralgia    You have been diagnosed with Arthralgia.    Arthralgia means pain and stiffness of the joints. People often describe the pain as  aching or throbbing. Arthralgia can affect one or more joints. It can be caused by many types of conditions and/or injuries. Often, arthralgia lasts for a long time and people need treatment over months or years. Some causes of arthralgia are:     Infection with a virus.   Many types of infections that are starting to improve. When recovering from infection, sometimes there is joint pain.    Autoimmune diseases (where the body attacks itself). Examples are Lupus or Rheumatoid Arthritis.   Inflammation of the tendons or the fluid-filled sacs (bursa) surrounding your joints.   Low thyroid function.   Depression.     You might need another exam or more tests to find out why you have arthralgias. At this time, the cause of your symptoms does not seem dangerous. You do not need to stay in the hospital.    We don't believe your condition is dangerous right now. However, you need to be careful. Sometimes a problem that seems small can get serious later. This is why it is very important to come back here or go to the nearest Emergency Department unless you are much improved.    Clues that joint pain is dangerous are:     Hot and swollen joints. This may mean they are  infected.   Fever (Temperature higher than 100.48F or 38C), weight loss and feeling very ill can be symptoms of severe infection (sepsis).   Severe pain, weakness or numbness (loss of feeling).    Some things you can try at home are:     Over-the-counter pain medications.   Heating pads and warm baths.   Physical therapy.    Follow the instructions for any medication you get prescribed.     Have a close follow-up with your primary care doctor.    YOU SHOULD SEEK MEDICAL ATTENTION IMMEDIATELY, EITHER HERE OR AT THE NEAREST EMERGENCY DEPARTMENT, IF ANY OF THE FOLLOWING OCCURS:     You have a fever (temperature higher than 100.48F or 38C).   Your pain does not go away or gets worse.   The joint that hurts turns red and/or gets  swollen.   You suddenly can't walk.   You don't feel better after treatment or feel you're getting worse.   You get any other symptoms, concerns, or don't get better as expected.    If you can't follow up with your doctor, or if at any time you feel you need to be rechecked or seen again, come back here or go to the nearest emergency department.               Elevated Blood Pressure    During your visit today your blood pressure was higher than normal.    Check your blood pressure several times over the next several days, then follow up with your regular doctor. If you do not have a doctor, ask the medical staff to refer you to one.    You may need medication for your blood pressure if it stays high. Untreated high blood pressure can cause damage to your heart and kidneys and may lead to a heart attack or stroke. It is VERY IMPORTANT to follow up with your doctor.   Check your blood pressure daily and follow up with your doctor.   A doctor will diagnose high blood pressure only if your blood pressure is high for several days. Many pharmacies have machines that let you check your own blood pressure. You can also check with a fire station to see whether a paramedic will take your blood pressure. Another option is to purchase a blood pressure monitor to use at home. These are available at most pharmacies.     YOU SHOULD SEEK MEDICAL ATTENTION IMMEDIATELY, EITHER HERE OR AT THE NEAREST EMERGENCY DEPARTMENT, IF ANY OF THE FOLLOWING OCCURS:   You have a sudden or severe headache.   You are numb, tingly, or weak on one side of your body, half of your face droops, or you have trouble speaking.   You have chest pain.   You are short of breath.               Renal Insufficiency    You have been diagnosed with a kidney problem. This problem is called renal insufficiency.    This means that your kidneys are not working correctly. Your kidneys get rid of the waste from your body. Many things can cause kidney  problems. These include high blood pressure, certain medicines and dehydration. .Kidney problems can also be caused by narrowing of the arteries that bring blood to them.    Your kidneys are not working as well as they should, but they are still doing their job. We feel that it's safe for you to go home. Make sure  that your doctor knows about this. He or she will need to do more tests.    YOU SHOULD SEEK MEDICAL ATTENTION IMMEDIATELY, EITHER HERE OR AT THE NEAREST EMERGENCY DEPARTMENT, IF ANY OF THE FOLLOWING OCCURS:   You have shortness of breath or chest pain.   You have swelling in your abdomen (belly), legs or feet.   Shortness of breath that makes it hard to sleep at night.   You have fever (temperature higher than 100.75F / 38C).               Knee Pain NOS    You have been seen for knee pain.    There are a few causes for knee pain. The doctor feels your knee pain is not from an injury to your knee's bones or ligaments.     Injury to the ligaments or bones is not the only cause of knee pain. There are other causes. These include:   Tendonitis. This is the inflammation (swelling) of the tendons. Tendons are the thick cords that connect the muscles around the knee to the bones of the knee joint.   Bursitis. This is the inflammation (swelling) of the fluid-filled sacs that cushion the knee joint.   Arthritis (inflammation of joints).   Gout (swelling of the joints).   Knee injuries from overuse.    Some things you can do to treat your knee pain are:   Apply ice to the knee with an ice pack. Be sure to put a towel between the ice pack and your skin. NEVER PLACE DIRECTLY ON YOUR SKIN. You can do this for 15 minutes at a time, several times a day.   Use anti-inflammatory medicine like ibuprofen (Advil or Motrin) to help the pain and swelling.   Avoid doing things that put a lot of stress on your knee joints. This includes running or playing tennis.    YOU SHOULD SEEK MEDICAL ATTENTION  IMMEDIATELY, EITHER HERE OR AT THE NEAREST EMERGENCY DEPARTMENT, IF ANY OF THE FOLLOWING OCCUR:   Your knee pain gets worse.   You have fever (temperature higher than 100.75F / 38C) or chills or your knee gets more red or warm.   You have any other problems or concerns.               Anemia    You have been diagnosed with anemia.    Anemia means "a low red blood cell count." Red blood cells are a part of your blood. These carry oxygen. Blood also has white blood cells, which fight infection and platelets, which help blood to clot.    Symptoms of anemia include fatigue (feeling tired) and weakness. Symptoms also include shortness of breath or chest pain with exercise or even normal activity. Another sign is pale color of the skin, lips and fingernail beds.    Anemia can have many causes. These include:   Ongoing (continual) blood loss. Sometimes there can be a slow "leak" of blood into the bowels. It can also happen with menstruation (menstrual period). Over time, the blood loss adds up. Then your blood count can get too low.   Iron deficiency: Iron is needed to make new red blood cells. Sometimes iron intake is too low for the body's needs.   Vitamin deficiency: The body needs vitamin Z00 and folic acid to make new red blood cells. If intake of these vitamins is too low from poor nutrition or too much alcohol, you can become anemic.  Chronic diseases: Some medical illnesses cause low blood count. This is especially the case for those with generalized inflammation.   Kidney disease: Patients with long-term kidney problems can get anemia.   Blood breakdown: Some diseases cause the blood cells in the blood stream to be destroyed or broken down. This can cause a low blood count.    The exact cause of your anemia is not known at this time. You may have had tests to see why you are anemic. You can get the results soon. See your primary care doctor or the referral doctor for more evaluation.    After an  evaluation, the doctor thinks your blood count IS NOT so low that you need a blood transfusion. Follow-up with your regular doctor for more rechecks on your blood count.    YOU SHOULD SEEK MEDICAL ATTENTION IMMEDIATELY, EITHER HERE OR AT THE NEAREST EMERGENCY DEPARTMENT, IF ANY OF THE FOLLOWING OCCURS:   You get light-headed and dizzy as if about to faint.   You get worsening shortness of breath during regular activities like walking or climbing stairs.   You get chest pain during regular activities like walking or climbing stairs.   IF YOU WERE BLEEDING.Marland KitchenMarland KitchenIf bleeding gets worse.

## 2016-08-08 NOTE — ED Provider Notes (Signed)
Physician/Midlevel provider first contact with patient: 08/08/16 0113         History     Chief Complaint   Patient presents with   . Knee Pain     Pt with bil knee pain today with hx of same with gout and arthritis- both knees equal in pain- usual meds did not work- no syncope or trauma- no fever or rash- no other pain or sob- no uri sxs- no n/v/d/c- no dysuria- no other complaints, no reported confusion      The history is provided by the patient and a relative. The history is limited by a language barrier. A language interpreter was used.   Knee Pain   Associated symptoms: no back pain, no fever and no neck pain             Past Medical History:   Diagnosis Date   . Congestive heart failure (CHF)    . Diabetes mellitus    . Hypertension        Past Surgical History:   Procedure Laterality Date   . ABDOMINAL SURGERY         History reviewed. No pertinent family history.    Social  Social History   Substance Use Topics   . Smoking status: Former Research scientist (life sciences)   . Smokeless tobacco: Never Used   . Alcohol use No       .     No Known Allergies    Home Medications     Med List Status:  Complete Set By: Valma Cava, RN at 08/08/2016  1:15 AM                acetaminophen (TYLENOL) 500 MG tablet     Take 1,000 mg by mouth every 6 (six) hours as needed for Pain.     atorvastatin (LIPITOR) 40 MG tablet     Take 1 tablet (40 mg total) by mouth every evening.     bumetanide (BUMEX) 1 MG tablet     Take 1 tablet (1 mg total) by mouth 2 (two) times daily.     cyclobenzaprine (FLEXERIL) 10 MG tablet     Take 1 tablet (10 mg total) by mouth every 8 (eight) hours as needed for Muscle spasms.     docusate sodium (COLACE) 100 MG capsule     Take 100 mg by mouth 2 (two) times daily.     glucose blood (TRUE METRIX BLOOD GLUCOSE TEST) test strip     1 each by Other route 2 (two) times daily. Use as instructed     hydrALAZINE (APRESOLINE) 100 MG tablet     Take 1 tablet (100 mg total) by mouth 3 (three) times daily.      HYDROcodone-acetaminophen (NORCO) 5-325 MG per tablet     Take 1 tablet by mouth every 8 (eight) hours as needed for Pain.     insulin glargine 100 UNIT/ML injection pen     Inject 8 Units into the skin nightly.     Insulin Pen Needle 31G X 5 MM Misc     INJECT INSULIN DAILY AS DIRECTED     isosorbide mononitrate (IMDUR) 120 MG 24 hr tablet     Take 120 mg by mouth.     lidocaine (LIDODERM) 5 %     Place 1 patch onto the skin daily.Remove & Discard patch within 12 hours or as directed by MD     NIFEdipine (ADALAT CC) 90 MG 24 hr tablet  Take 1 tablet (90 mg total) by mouth daily.     oxyCODONE-acetaminophen (PERCOCET) 5-325 MG per tablet     Take 1-2 tablets by mouth every 4 (four) hours as needed for Pain.     SITagliptin (JANUVIA) 50 MG tablet     Take 50 mg by mouth daily. 1/2 tablet daily           Review of Systems   Constitutional: Negative for activity change and fever.   HENT: Negative for congestion, rhinorrhea and sore throat.    Respiratory: Negative for cough and shortness of breath.    Cardiovascular: Negative for chest pain.   Gastrointestinal: Negative for abdominal pain, constipation, diarrhea, nausea and vomiting.   Genitourinary: Negative for difficulty urinating and dysuria.   Musculoskeletal: Positive for arthralgias. Negative for back pain and neck pain.        No trauma   Skin: Negative for color change and rash.   Neurological: Negative for syncope, weakness and headaches.   Psychiatric/Behavioral: Negative for confusion.       Physical Exam    BP: 195/85, Heart Rate: 84, Temp: 97.8 F (36.6 C), Resp Rate: 14, SpO2: 97 %, Weight: 64 kg    Physical Exam   Constitutional: He is oriented to person, place, and time. He appears well-developed and well-nourished.  Non-toxic appearance. He does not have a sickly appearance. He does not appear ill. He appears distressed.   Elevated bp   HENT:   Head: Normocephalic and atraumatic.   Right Ear: External ear normal.   Left Ear: External ear normal.      Nose: Nose normal.   Mouth/Throat: Oropharynx is clear and moist and mucous membranes are normal.   Eyes: Conjunctivae, EOM and lids are normal. Pupils are equal, round, and reactive to light.   Neck: Trachea normal and full passive range of motion without pain. No spinous process tenderness present.   Cardiovascular: Normal rate, regular rhythm, normal heart sounds and normal pulses.    Pulmonary/Chest: Effort normal and breath sounds normal.   Abdominal: Soft. Bowel sounds are normal. There is no tenderness. There is no rebound and no guarding.   Musculoskeletal:        Right knee: He exhibits decreased range of motion. Tenderness found.        Left knee: He exhibits decreased range of motion. Tenderness found.        Thoracic back: He exhibits normal range of motion, no tenderness, no bony tenderness and no pain.        Lumbar back: He exhibits normal range of motion, no tenderness, no bony tenderness and no pain.        Legs:  nt ext with full rom and nvi ex bil knees with dec rom and ttp with sts and no redness or sig warmth- distal nvi   Lymphadenopathy:     He has no cervical adenopathy.   Neurological: He is alert and oriented to person, place, and time. He has normal strength. No cranial nerve deficit or sensory deficit. GCS eye subscore is 4. GCS verbal subscore is 5. GCS motor subscore is 6.   Skin: Skin is warm, dry and intact. No rash noted.   Psychiatric: He has a normal mood and affect.   Nursing note and vitals reviewed.        MDM and ED Course     ED Medication Orders     Start Ordered     Status Ordering Provider  08/08/16 0259 08/08/16 0258  oxyCODONE-acetaminophen (PERCOCET) 5-325 MG per tablet 2 tablet  Once     Route: Oral  Ordered Dose: 2 tablet     Ordered Melida Northington    08/08/16 0122 08/08/16 0121  fentaNYL (PF) (SUBLIMAZE) injection 25 mcg  Once     Route: Intravenous  Ordered Dose: 25 mcg     Last MAR action:  Given Levander Katzenstein    08/08/16 0122 08/08/16 0121  ondansetron  (ZOFRAN) injection 4 mg  Once     Route: Intravenous  Ordered Dose: 4 mg     Last MAR action:  Given Susan Arana    08/08/16 0122 08/08/16 0121  0.9%  NaCl infusion  Continuous     Route: Intravenous     Last MAR action:  New Bag Alonna Bartling             MDM  Number of Diagnoses or Management Options  Anemia, unspecified type:   Elevated blood pressure reading:   Elevated C-reactive protein (CRP):   Elevated sed rate:   Leukocytosis, unspecified type:   Pain in both knees, unspecified chronicity:   Renal insufficiency:   Diagnosis management comments: bil knee pain with hx of same with gout/arthritis- support- check labs- dispo pending    I, Dannielle Burn, MD, have been the primary provider for Marco Bruce during this Emergency Dept visit.    Oxygen saturation by pulse oximetry is 95%-100%, Normal.  Interventions: None Needed    Renal insuff c/w prev- support and dispo pending- esr/crp elev with normal uric acid- mildly elev wbc noted    Dx on list d/w pt and family member- pt feeling better- due to see doctor today (Sunday)- d/c with am f/u with two percocet to go    Labs Reviewed  CBC AND DIFFERENTIAL - Abnormal; Notable for the following:      WBC                           13.19 (*)               Hgb                           11.2 (*)               Hematocrit                    33.5 (*)               RBC                           3.63 (*)               Abs Eos Automated             2.19 (*)            All other components within normal limits  COMPREHENSIVE METABOLIC PANEL - Abnormal; Notable for the following:      BUN                           33 .0 (*)               Creatinine  2.0 (*)             All other components within normal limits  SEDIMENTATION RATE - Abnormal; Notable for the following:      Sed Rate                      57 (*)              All other components within normal limits  C-REACTIVE PROTEIN - Abnormal; Notable for the following:      C-Reactive Protein             2.6 (*)             All other components within normal limits  URIC ACID  GFR       Amount and/or Complexity of Data Reviewed  Clinical lab tests: reviewed and ordered                     Procedures    Clinical Impression & Disposition     Clinical Impression  Final diagnoses:   Pain in both knees, unspecified chronicity   Elevated blood pressure reading   Leukocytosis, unspecified type   Anemia, unspecified type   Renal insufficiency   Elevated C-reactive protein (CRP)   Elevated sed rate        ED Disposition     ED Disposition Condition Date/Time Comment    Discharge  Sun Aug 08, 2016  2:58 AM Marco Bruce discharge to home/self care.    Condition at disposition: Stable           New Prescriptions    No medications on file                 Dannielle Burn, MD  08/08/16 [phone]

## 2016-08-12 ENCOUNTER — Inpatient Hospital Stay
Admission: EM | Admit: 2016-08-12 | Discharge: 2016-08-17 | DRG: 682 | Disposition: A | Discharge status: Home Health | Payer: Medicare Other | Attending: Internal Medicine | Admitting: Internal Medicine

## 2016-08-12 ENCOUNTER — Inpatient Hospital Stay: Payer: Medicare Other | Admitting: Internal Medicine

## 2016-08-12 DIAGNOSIS — I130 Hypertensive heart and chronic kidney disease with heart failure and stage 1 through stage 4 chronic kidney disease, or unspecified chronic kidney disease: Secondary | ICD-10-CM | POA: Diagnosis present

## 2016-08-12 DIAGNOSIS — I5033 Acute on chronic diastolic (congestive) heart failure: Secondary | ICD-10-CM | POA: Diagnosis present

## 2016-08-12 DIAGNOSIS — I214 Non-ST elevation (NSTEMI) myocardial infarction: Secondary | ICD-10-CM | POA: Diagnosis present

## 2016-08-12 DIAGNOSIS — R52 Pain, unspecified: Secondary | ICD-10-CM

## 2016-08-12 DIAGNOSIS — N184 Chronic kidney disease, stage 4 (severe): Secondary | ICD-10-CM | POA: Diagnosis present

## 2016-08-12 DIAGNOSIS — I509 Heart failure, unspecified: Secondary | ICD-10-CM | POA: Diagnosis present

## 2016-08-12 DIAGNOSIS — I21A1 Myocardial infarction type 2: Secondary | ICD-10-CM | POA: Diagnosis present

## 2016-08-12 DIAGNOSIS — N179 Acute kidney failure, unspecified: Principal | ICD-10-CM | POA: Diagnosis present

## 2016-08-12 DIAGNOSIS — I10 Essential (primary) hypertension: Secondary | ICD-10-CM | POA: Diagnosis present

## 2016-08-12 DIAGNOSIS — A419 Sepsis, unspecified organism: Secondary | ICD-10-CM | POA: Diagnosis present

## 2016-08-12 DIAGNOSIS — Z87891 Personal history of nicotine dependence: Secondary | ICD-10-CM

## 2016-08-12 DIAGNOSIS — J209 Acute bronchitis, unspecified: Secondary | ICD-10-CM | POA: Diagnosis present

## 2016-08-12 DIAGNOSIS — I083 Combined rheumatic disorders of mitral, aortic and tricuspid valves: Secondary | ICD-10-CM | POA: Diagnosis present

## 2016-08-12 DIAGNOSIS — M2550 Pain in unspecified joint: Secondary | ICD-10-CM

## 2016-08-12 DIAGNOSIS — D631 Anemia in chronic kidney disease: Secondary | ICD-10-CM | POA: Diagnosis present

## 2016-08-12 DIAGNOSIS — E785 Hyperlipidemia, unspecified: Secondary | ICD-10-CM | POA: Diagnosis present

## 2016-08-12 DIAGNOSIS — M109 Gout, unspecified: Secondary | ICD-10-CM | POA: Diagnosis present

## 2016-08-12 DIAGNOSIS — E871 Hypo-osmolality and hyponatremia: Secondary | ICD-10-CM | POA: Diagnosis present

## 2016-08-12 DIAGNOSIS — M1990 Unspecified osteoarthritis, unspecified site: Secondary | ICD-10-CM | POA: Diagnosis present

## 2016-08-12 DIAGNOSIS — Z950 Presence of cardiac pacemaker: Secondary | ICD-10-CM

## 2016-08-12 DIAGNOSIS — I272 Pulmonary hypertension, unspecified: Secondary | ICD-10-CM | POA: Diagnosis present

## 2016-08-12 DIAGNOSIS — E1122 Type 2 diabetes mellitus with diabetic chronic kidney disease: Secondary | ICD-10-CM | POA: Diagnosis present

## 2016-08-12 DIAGNOSIS — Z79899 Other long term (current) drug therapy: Secondary | ICD-10-CM

## 2016-08-12 LAB — I-STAT CHEM 8 CARTRIDGE
BUN I-Stat: 103 mg/dL — ABNORMAL HIGH (ref 8–26)
Chloride I-Stat: 94 mmol/L — ABNORMAL LOW (ref 98–109)
Creatinine I-Stat: 5.3 mg/dL — ABNORMAL HIGH (ref 0.66–1.25)
Glucose I-Stat: 305 mg/dL — ABNORMAL HIGH (ref 70–100)
Potassium I-Stat: 4.2 mmol/L (ref 3.5–4.9)
Sodium I-Stat: 130 mmol/L — ABNORMAL LOW (ref 134–146)
i-STAT CO2: 22 mmol/L (ref 21–30)
i-STAT Calcium Ionized: 2.3 mEq/L (ref 2.2–2.6)

## 2016-08-12 LAB — URINE MICROSCOPIC: RBC, UA: 0 /hpf (ref 0–5)

## 2016-08-12 LAB — CBC AND DIFFERENTIAL
Absolute NRBC: 0.02 10*3/uL — ABNORMAL HIGH
Basophils Absolute Automated: 0.02 10*3/uL (ref 0.00–0.20)
Basophils Automated: 0.2 %
Eosinophils Absolute Automated: 0.86 10*3/uL — ABNORMAL HIGH (ref 0.00–0.70)
Eosinophils Automated: 7.7 %
Hematocrit: 27.2 % — ABNORMAL LOW (ref 42.0–52.0)
Hgb: 9.8 g/dL — ABNORMAL LOW (ref 13.0–17.0)
Immature Granulocytes Absolute: 0.1 10*3/uL — ABNORMAL HIGH
Immature Granulocytes: 0.9 %
Lymphocytes Absolute Automated: 1.45 10*3/uL (ref 0.50–4.40)
Lymphocytes Automated: 12.9 %
MCH: 31.2 pg (ref 28.0–32.0)
MCHC: 36 g/dL (ref 32.0–36.0)
MCV: 86.6 fL (ref 80.0–100.0)
MPV: 11.4 fL (ref 9.4–12.3)
Monocytes Absolute Automated: 0.84 10*3/uL (ref 0.00–1.20)
Monocytes: 7.5 %
Neutrophils Absolute: 7.96 10*3/uL (ref 1.80–8.10)
Neutrophils: 70.8 %
Nucleated RBC: 0.2 /100 WBC (ref 0.0–1.0)
Platelets: 262 10*3/uL (ref 140–400)
RBC: 3.14 10*6/uL — ABNORMAL LOW (ref 4.70–6.00)
RDW: 14 % (ref 12–15)
WBC: 11.23 10*3/uL — ABNORMAL HIGH (ref 3.50–10.80)

## 2016-08-12 LAB — URINALYSIS
Bilirubin, UA: NEGATIVE
Blood, UA: NEGATIVE
Glucose, UA: NEGATIVE
Ketones UA: NEGATIVE
Leukocyte Esterase, UA: NEGATIVE
Nitrite, UA: NEGATIVE
Protein, UR: 30 — AB
Specific Gravity UA: 1.017 (ref 1.001–1.035)
Urine pH: 5.5 (ref 5.0–8.0)
Urobilinogen, UA: 0.2 mg/dL

## 2016-08-12 LAB — HEPATIC FUNCTION PANEL
ALT: 19 U/L (ref 0–55)
AST (SGOT): 28 U/L (ref 5–34)
Albumin/Globulin Ratio: 1.3 (ref 0.9–2.2)
Albumin: 3.4 g/dL — ABNORMAL LOW (ref 3.5–5.0)
Alkaline Phosphatase: 75 U/L (ref 38–106)
Bilirubin Direct: 0.2 mg/dL (ref 0.0–0.5)
Bilirubin Indirect: 0.3 mg/dL (ref 0.0–1.1)
Bilirubin, Total: 0.5 mg/dL (ref 0.2–1.2)
Globulin: 2.7 g/dL (ref 2.0–3.6)
Protein, Total: 6.1 g/dL (ref 6.0–8.3)

## 2016-08-12 LAB — C-REACTIVE PROTEIN: C-Reactive Protein: 9.3 mg/dL — ABNORMAL HIGH (ref 0.0–0.8)

## 2016-08-12 LAB — SEDIMENTATION RATE: Sed Rate: 105 mm/Hr — ABNORMAL HIGH (ref 0–15)

## 2016-08-12 LAB — LACTIC ACID, PLASMA: Lactic Acid: 1.5 mmol/L (ref 0.2–2.0)

## 2016-08-12 LAB — CK: Creatine Kinase (CK): 63 U/L (ref 47–267)

## 2016-08-12 MED ORDER — OXYCODONE HCL 5 MG/5ML PO SOLN
5.0000 mg | Freq: Four times a day (QID) | ORAL | Status: DC | PRN
Start: 2016-08-12 — End: 2016-08-14
  Administered 2016-08-12 – 2016-08-13 (×2): 5 mg via ORAL
  Filled 2016-08-12 (×2): qty 5

## 2016-08-12 MED ORDER — SODIUM CHLORIDE 0.9 % IV BOLUS
1000.0000 mL | Freq: Once | INTRAVENOUS | Status: AC
Start: 2016-08-12 — End: 2016-08-12
  Administered 2016-08-12: 21:00:00 1000 mL via INTRAVENOUS

## 2016-08-12 MED ORDER — INSULIN REGULAR HUMAN 100 UNIT/ML IJ SOLN
5.0000 [IU] | Freq: Once | INTRAMUSCULAR | Status: AC
Start: 2016-08-12 — End: 2016-08-12
  Administered 2016-08-12: 22:00:00 5 [IU] via SUBCUTANEOUS
  Filled 2016-08-12: qty 15

## 2016-08-12 NOTE — ED Notes (Signed)
Isolation:none  Isolation Reason:  Bed Type:full tele  Assist:total assist

## 2016-08-12 NOTE — ED Triage Notes (Signed)
Pt arrived by EMS daughter at bedside she is translating for pt she states pt had admission to [facility] 5 days ago and is continuing to have pain., pain to both legs and right shoulder daughter adds pt has not had a BM for approx 10 days. She tells me that her father is choosing to not take some of his medications due to trying to figure out which ones are causing him to have pain. She states pt is in bed all the time unable to walk pt used a cane and was able to walk 10 days ago. Pt appears alert, moaning

## 2016-08-12 NOTE — ED Provider Notes (Signed)
Physician/Midlevel provider first contact with patient: 08/12/16 6967         EMERGENCY DEPARTMENT HISTORY AND PHYSICAL EXAM    Date Time: 08/16/16 9:52 AM  Patient Name: [facility]  Mid level Provider: Elisha Headland, PA-C    History of Presenting Illness:     Chief Complaint: leg pain, weakness, joint pain   History obtained from: Patient.  Onset/Duration: 1 wk  Quality: sharp  Severity:moderate to severe   Aggravating Factors: any movement of extremities   Alleviating Factors: rest  Associated Symptoms: weakness; poor appetitie   Narrative/Additional Historical Findings:Marco Bruce is a 83 y.o. male  presents to the emergency department with multiple complaints and has been bedridden since last Thursday evening.  Patient states to have developed a unique burning like pain to his knees bilaterally with sharp, stabbing pain as well and an inability to walk; since then he has developed bilateral elbow pain, shoulder pain and ankle pain.  Patient was seen in the ED on Saturday, workup found to have elevated ESR and CRP; treated with pain medication and discharged.  However, patient states pain has steadily increased.  He has been completely nonambulatory, urinating, and a diaper and sitting up with assistance and being fed.     Patient appears to have chronic knee pain.  It is unclear the source of this knee pain.  Patient states to suffer from gout upon which he takes a medication and the pain goes away; usually has gout every few months.  He experienced knee pain a month ago and was seen by Dr., but given a shot in his spine.  He states this was the 1st time he had a spinal shot and he is unsure why they gave him the shot but it marginally improved his pain.     His baseline prior to last week was minimal activity, however, ambulatory without pain in the house using a cane.  He could walk, stand, and feed himself.     Pt denies tingling, numbness or weakness; denies fever.     Nursing notes from this date of service were  reviewed.    Past Medical History:     Past Medical History:   Diagnosis Date   . Congestive heart failure (CHF)    . Diabetes mellitus    . Gout     chronic   . Hypertension      Immunizations:    Past Surgical History:     Past Surgical History:   Procedure Laterality Date   . ABDOMINAL SURGERY         Family History:   History reviewed. No pertinent family history.    Social History:     Social History     Social History   . Marital status: Married     Spouse name: N/A   . Number of children: N/A   . Years of education: N/A     Social History Main Topics   . Smoking status: Never Smoker   . Smokeless tobacco: Never Used   . Alcohol use No      Comment: former alcoholic   . Drug use: No   . Sexual activity: Not on file     Other Topics Concern   . Not on file     Social History Narrative   . No narrative on file       Allergies:   No Known Allergies    Medications:     Current Facility-Administered Medications:   .  acetaminophen (TYLENOL) suppository 650 mg, 650 mg, Rectal, Q4H PRN, Traficante, Diane Lyn, DO, 650 mg at 08/15/16 0024  .  acetaminophen (TYLENOL) tablet 650 mg, 650 mg, Oral, 4X Daily PRN, Hartojo, Wibisono, MD, 650 mg at 08/14/16 1419  .  allopurinol (ZYLOPRIM) tablet 100 mg, 100 mg, Oral, Daily, Flossie Buffy, MD, 100 mg at 08/15/16 1548  .  aspirin EC EC tablet 325 mg, 325 mg, Oral, Daily, Hartojo, Wibisono, MD, 325 mg at 08/15/16 0910  .  atorvastatin (LIPITOR) tablet 40 mg, 40 mg, Oral, QPM, Hartojo, Wibisono, MD, 40 mg at 08/15/16 1816  .  azithromycin (ZITHROMAX) 500 mg in sodium chloride 0.9 % 250 mL IVPB, 500 mg, Intravenous, Q24H Montara, Choudhary, Sarfraz A, MD, Last Rate: 250 mL/hr at 08/15/16 1559, 500 mg at 08/15/16 1559  .  bisacodyl (DULCOLAX) suppository 10 mg, 10 mg, Rectal, Once, Alemayehu, Bethel, MD  .  cyclobenzaprine (FLEXERIL) tablet 10 mg, 10 mg, Oral, Q8H PRN, Hartojo, Wibisono, MD, 10 mg at 08/14/16 1542  .  Nursing communication: Adult Hypoglycemia Treatment Algorithm, , ,  Until Discontinued **AND** dextrose (GLUCOSE) 40 % oral gel 15 g of glucose, 15 g of glucose, Oral, PRN **AND** dextrose 50 % bolus 12.5 g, 12.5 g, Intravenous, PRN **AND** glucagon (rDNA) (GLUCAGEN) injection 1 mg, 1 mg, Intramuscular, PRN, Hartojo, Wibisono, MD  .  enoxaparin (LOVENOX) syringe 30 mg, 30 mg, Subcutaneous, Daily, Alemayehu, Bethel, MD, 30 mg at 08/15/16 0913  .  gabapentin (NEURONTIN) capsule 100 mg, 100 mg, Oral, TID, Hartojo, Wibisono, MD, 100 mg at 08/15/16 2300  .  insulin lispro (HumaLOG) injection 1-3 Units, 1-3 Units, Subcutaneous, QHS PRN, Hartojo, Wibisono, MD, 2 Units at 08/15/16 2309  .  insulin lispro (HumaLOG) injection 1-5 Units, 1-5 Units, Subcutaneous, TID AC PRN, Hartojo, Wibisono, MD, 3 Units at 08/16/16 0823  .  iron sucrose (VENOFER) injection 200 mg, 200 mg, Intravenous, Q24H SCH, Flossie Buffy, MD, 200 mg at 08/15/16 1551  .  lactobacillus/streptococcus (RISAQUAD) capsule 1 capsule, 1 capsule, Oral, Daily, Renkes, Renato Gails, FNP  .  morphine injection 1 mg, 1 mg, Intravenous, Q2H PRN, Alemayehu, Bethel, MD, 1 mg at 08/15/16 0023  .  NIFEdipine XL (PROCARDIA XL) 24 hr tablet 30 mg, 30 mg, Oral, Daily, Hartojo, Wibisono, MD, 30 mg at 08/14/16 0922  .  ondansetron (ZOFRAN) injection 4 mg, 4 mg, Intravenous, Q6H PRN, Hartojo, Wibisono, MD  .  oxyCODONE (ROXICODONE) 5 MG/5ML solution 2.5 mg, 2.5 mg, Oral, Q6H PRN, Alemayehu, Bethel, MD  .  pantoprazole (PROTONIX) EC tablet 40 mg, 40 mg, Oral, QAM AC, Hartojo, Wibisono, MD, 40 mg at 08/16/16 0823  .  piperacillin-tazobactam (ZOSYN) 2.25 g in sodium chloride 0.9 % 100 mL IVPB mini-bag plus, 2.25 g, Intravenous, Q8H, Choudhary, Sarfraz A, MD, Last Rate: 200 mL/hr at 08/16/16 0126, 2.25 g at 08/16/16 0126  .  predniSONE (DELTASONE) tablet 20 mg, 20 mg, Oral, QAM Tyson Dense, MD, 20 mg at 08/16/16 0823  .  senna-docusate (PERICOLACE) 8.6-50 MG per tablet 2 tablet, 2 tablet, Oral, BID, Alemayehu, Bethel, MD, 2 tablet at  08/15/16 1816    Review of Systems:   Constitutional: No fever +change in activity.  Eyes: No eye redness. No eye discharge.  ENT: No ear pain or sore throat  Cardiovascular: No cp or palpitations  Respiratory: No cough or shortness of breath.  GI: No vomiting or diarrhea.  Genitourinary: Normal urination frequency  Musculoskeletal: +extremity pain w decreased use  Skin: no  rash or skin lesions.  Neurologic: Normal level of alertness  Psychiatric:  All other systems reviewed and are negative  Physical Exam:     ED Triage Vitals [08/12/16 1808]   Enc Vitals Group      BP 149/60      Heart Rate (!) 59      Resp Rate 18      Temp 98.2 F (36.8 C)      Temp Source Oral      SpO2 95 %      Weight       Height       Head Circumference       Peak Flow       Pain Score       Pain Loc       Pain Edu?       Excl. in [place]?      Constitutional: Vital signs reviewed. Well hydrated, well perfused, and no increased work of breathing. Appearance: ill appearing; non toxic.  Head:  Normocephalic, atraumatic  Eyes: No conjunctival injection. No discharge. EOMI  ENT: Mucous membranes moist, No oral lesions, TMs wnl   Neck: Normal range of motion. Non-tender.  Respiratory/Chest: Clear to auscultation. No respiratory distress.   Cardiovascular: Regular rate and rhythm. No murmur.   Abdomen: Soft and non-tender. No masses or hepatosplenomegaly.  Genitourinary:  UpperExtremity: +palpable tenderness to shoulder and elbows b/l.  No swelling; full ROM, however limited to elbow due to pain; No edema or cyanosis.  LowerExtremity: severe pain b/l to knees; mild swelling noted to knees b/l; no erythema or warmth; unable to engage in ROM due to pain; +ttp and pain to ankles b/l. No edema or cyanosis.   Neurological: No focal motor deficits by observation. Speech normal. Memory normal.  Skin: Warm and dry. No rash.  Lymphatic: No cervical lymphadenopathy.  Psychiatric: Normal affect. Normal concentration.    Labs:     Labs Reviewed   CBC AND  DIFFERENTIAL - Abnormal; Notable for the following:        Result Value    WBC 11.23 (*)     Hgb 9.8 (*)     Hematocrit 27.2 (*)     RBC 3.14 (*)     Abs Eos Automated 0.86 (*)     Absolute Immature Granulocyte 0.10 (*)     Absolute NRBC 0.02 (*)     All other components within normal limits   SEDIMENTATION RATE - Abnormal; Notable for the following:     Sed Rate 105 (*)     All other components within normal limits   C-REACTIVE PROTEIN - Abnormal; Notable for the following:     C-Reactive Protein 9.3 (*)     All other components within normal limits   URINALYSIS - Abnormal; Notable for the following:     Protein, UR 30 (*)     All other components within normal limits   HEPATIC FUNCTION PANEL - Abnormal; Notable for the following:     Albumin 3.4 (*)     All other components within normal limits   URINE MICROSCOPIC - Abnormal; Notable for the following:     Granular Casts, UA 0 - 2 (*)     Hyaline Casts, UA 6 - 10 (*)     All other components within normal limits   MAGNESIUM - Abnormal; Notable for the following:     Magnesium 2.7 (*)     All other components within normal limits   COMPREHENSIVE  METABOLIC PANEL - Abnormal; Notable for the following:     Glucose 246 (*)     BUN 100.7 (*)     Creatinine 4.1 (*)     Sodium 133 (*)     CO2 20 (*)     Albumin 3.3 (*)     All other components within normal limits   CBC AND DIFFERENTIAL - Abnormal; Notable for the following:     WBC 11.90 (*)     Hgb 9.4 (*)     Hematocrit 26.1 (*)     RBC 3.02 (*)     Neutrophils Absolute 9.16 (*)     Absolute Immature Granulocyte 0.11 (*)     Absolute NRBC 0.03 (*)     All other components within normal limits   B-TYPE NATRIURETIC PEPTIDE - Abnormal; Notable for the following:     B-Natriuretic Peptide 7,758.8 (*)     All other components within normal limits   TROPONIN I - Abnormal; Notable for the following:     Troponin I 1.45 (*)     All other components within normal limits   HEMOGLOBIN A1C - Abnormal; Notable for the following:      Hemoglobin A1C 7.1 (*)     All other components within normal limits   TROPONIN I - Abnormal; Notable for the following:     Troponin I 1.24 (*)     All other components within normal limits   TROPONIN I - Abnormal; Notable for the following:     Troponin I 1.16 (*)     All other components within normal limits   BASIC METABOLIC PANEL - Abnormal; Notable for the following:     Glucose 191 (*)     BUN 90.6 (*)     Creatinine 2.9 (*)     Potassium 3.3 (*)     All other components within normal limits   CBC AND DIFFERENTIAL - Abnormal; Notable for the following:     WBC 13.39 (*)     Hgb 8.3 (*)     Hematocrit 23.8 (*)     RBC 2.66 (*)     Neutrophils Absolute 10.18 (*)     Abs Mono Automated 1.35 (*)     Abs Eos Automated 0.80 (*)     Absolute Immature Granulocyte 0.08 (*)     Absolute NRBC 0.03 (*)     All other components within normal limits   PROCALCITONIN - Abnormal; Notable for the following:     Procalcitonin 3.35 (*)     All other components within normal limits   IRON PROFILE - Abnormal; Notable for the following:     Iron 11 (*)     TIBC 177 (*)     Iron Saturation 6 (*)     All other components within normal limits   URIC ACID - Abnormal; Notable for the following:     Uric acid 11.7 (*)     All other components within normal limits   COMPREHENSIVE METABOLIC PANEL - Abnormal; Notable for the following:     Glucose 345 (*)     BUN 84.1 (*)     Creatinine 2.8 (*)     CO2 20 (*)     Protein, Total 5.2 (*)     Albumin 2.6 (*)     All other components within normal limits   CBC AND DIFFERENTIAL - Abnormal; Notable for the following:     WBC 13.59 (*)     Hgb  8.7 (*)     Hematocrit 25.8 (*)     RBC 2.85 (*)     Neutrophils Absolute 10.40 (*)     Abs Mono Automated 1.53 (*)     Absolute Immature Granulocyte 0.46 (*)     All other components within normal limits   COMPREHENSIVE METABOLIC PANEL - Abnormal; Notable for the following:     Glucose 288 (*)     BUN 92.9 (*)     Creatinine 2.6 (*)     Sodium 135 (*)      CO2 18 (*)     Protein, Total 5.2 (*)     Albumin 2.5 (*)     AST (SGOT) 53 (*)     All other components within normal limits   CBC AND DIFFERENTIAL - Abnormal; Notable for the following:     Hgb 8.8 (*)     Hematocrit 25.5 (*)     RBC 2.87 (*)     Absolute Immature Granulocyte 0.10 (*)     Absolute NRBC 0.04 (*)     All other components within normal limits   I-STAT CHEM 8 CARTRIDGE - Abnormal; Notable for the following:     i-STAT Glucose 305 (*)     i-STAT BUN 103 (*)     i-STAT Creatinine 5.30 (*)     i-STAT Sodium 130 (*)     i-STAT Chloride 94 (*)     All other components within normal limits   GLUCOSE WHOLE BLOOD - POCT - Abnormal; Notable for the following:     POCT - Glucose Whole blood 221 (*)     All other components within normal limits   GLUCOSE WHOLE BLOOD - POCT - Abnormal; Notable for the following:     POCT - Glucose Whole blood 201 (*)     All other components within normal limits   GLUCOSE WHOLE BLOOD - POCT - Abnormal; Notable for the following:     POCT - Glucose Whole blood 127 (*)     All other components within normal limits   GLUCOSE WHOLE BLOOD - POCT - Abnormal; Notable for the following:     POCT - Glucose Whole blood 205 (*)     All other components within normal limits   GLUCOSE WHOLE BLOOD - POCT - Abnormal; Notable for the following:     POCT - Glucose Whole blood 190 (*)     All other components within normal limits   GLUCOSE WHOLE BLOOD - POCT - Abnormal; Notable for the following:     POCT - Glucose Whole blood 254 (*)     All other components within normal limits   GLUCOSE WHOLE BLOOD - POCT - Abnormal; Notable for the following:     POCT - Glucose Whole blood 329 (*)     All other components within normal limits   GLUCOSE WHOLE BLOOD - POCT - Abnormal; Notable for the following:     POCT - Glucose Whole blood 234 (*)     All other components within normal limits   GLUCOSE WHOLE BLOOD - POCT - Abnormal; Notable for the following:     POCT - Glucose Whole blood 295 (*)     All  other components within normal limits   GLUCOSE WHOLE BLOOD - POCT - Abnormal; Notable for the following:     POCT - Glucose Whole blood 322 (*)     All other components within normal limits   GLUCOSE WHOLE BLOOD -  POCT - Abnormal; Notable for the following:     POCT - Glucose Whole blood 297 (*)     All other components within normal limits   GLUCOSE WHOLE BLOOD - POCT - Abnormal; Notable for the following:     POCT - Glucose Whole blood 255 (*)     All other components within normal limits   GLUCOSE WHOLE BLOOD - POCT - Abnormal; Notable for the following:     POCT - Glucose Whole blood 277 (*)     All other components within normal limits   CULTURE BLOOD AEROBIC AND ANAEROBIC    Narrative:     ORDER#: [id number]                                    ORDERED BY: TRAFICANTE, DIA  SOURCE: Blood, Venipuncture R W                      COLLECTED:  08/14/16 02:52  ANTIBIOTICS AT COLL.:                                RECEIVED :  08/14/16 05:15  Culture Blood Aerobic and Anaerobic        PRELIM      08/16/16 05:21  08/15/16   No Growth after 1 day/s of incubation.  08/16/16   No Growth after 2 day/s of incubation.     CULTURE BLOOD AEROBIC AND ANAEROBIC    Narrative:     ORDER#: [id number]                                    ORDERED BY: TRAFICANTE, DIA  SOURCE: Blood, Venipuncture L H                      COLLECTED:  08/14/16 02:59  ANTIBIOTICS AT COLL.:                                RECEIVED :  08/14/16 05:15  Culture Blood Aerobic and Anaerobic        PRELIM      08/16/16 05:21  08/15/16   No Growth after 1 day/s of incubation.  08/16/16   No Growth after 2 day/s of incubation.     RESPIRATORY PATHOGEN PANEL, PCR (FILMARRAY)   LYME AB, TOTAL,REFLEX TO WESTERN BLOT (IGG & IGM)   LACTIC ACID, PLASMA    Narrative:     Cancel if the initial lactate level is < 2.0 mmol/L.   CK   CK   PT AND APTT   PHOSPHORUS   GFR   CK   CK   PHOSPHORUS   TSH   MAGNESIUM   LACTIC ACID, PLASMA   GFR   CALCIUM, IONIZED   HEMOLYSIS INDEX   GFR   GFR    ANA IFA W/REFLEX TO TITER/PAT/MULTIPLEX 11 AB         Rads:     Radiology Results (24 Hour)     Procedure Component Value Units Date/Time    CT Abdomen Pelvis WO IV/ W PO Cont [[id number]] Collected:  08/15/16 1350    Order Status:  Completed Updated:  08/15/16 1418  Narrative:       History: Fever. Leukocytosis.    CT imaging performed through the abdomen and pelvis following oral but  without intravenous contrast. No comparison available.    History: Abdominal pain. Sepsis.    CT imaging performed through the abdomen and pelvis following oral but  without intravenous contrast.    No consolidation on limited imaging through lung bases. The heart is  enlarged and coronary artery calcification noted.    The unenhanced liver, pancreas, spleen, adrenal glands, and kidneys are  normal. No hydroureteronephrosis.    Aorta normal caliber of multifocal calcific plaque.    The bowel is nondistended. Rectal wall thickening is nonspecific. No  surrounding inflammation however. No adenopathy or ascites. Normal  appendix seen at the right lower quadrant. No osseous lesions.      Impression:        Limited unenhanced CT. Nonspecific mild rectal wall  thickening. No localizing inflammatory or infectious process to explain  sepsis and leukocytosis.    Verner Mould, MD   08/15/2016 2:14 PM          MDM and ED Course   I, Elisha Headland, PA-C, have been the primary provider for Marco Bruce during this Emergency Dept visit.  Nursing notes, PMH, SH reviewed.   The attending signature signifies review and agreement of the history, physical examination, evaluation, clinical impression, and plan except as noted.     Oxygen saturation by pulse oximetry is 95%-100%, Normal.  Interventions: None Needed.      DDX  Chronic pain, RA, PMR, autoimmune illness, gout  AKI, dehydration    Pt is ill appearing with obvious pain; does not appear toxic.  Has remained stable while in ED.   Admitted to hospitalist service.     Pt seen and examined by  Dr.klukowski, agrees with care and plan.     Assessment/Plan:   Results and instructions reviewed at the bedside with patient and family.    Clinical Impression  Final diagnoses:   Acute renal failure, unspecified acute renal failure type   Polyarthralgia   Intractable pain       Disposition  ED Disposition     ED Disposition Condition Date/Time Comment    Admit  Thu Aug 12, 2016 10:21 PM Admitting Physician: Clare Gandy [[id number]]   Diagnosis: Acute renal failure (ARF) [832549]   Estimated Length of Stay: > or = to 2 midnights   Tentative Discharge Plan?: Home or Self Care [1]   Patient Class: Inpatient [101]            Prescriptions  Current Discharge Medication List              Signed by: Charma Igo, Sudie Grumbling, [place]  08/16/16 [phone]

## 2016-08-13 ENCOUNTER — Inpatient Hospital Stay: Payer: Medicare Other

## 2016-08-13 DIAGNOSIS — Z794 Long term (current) use of insulin: Secondary | ICD-10-CM

## 2016-08-13 DIAGNOSIS — N179 Acute kidney failure, unspecified: Principal | ICD-10-CM

## 2016-08-13 DIAGNOSIS — D649 Anemia, unspecified: Secondary | ICD-10-CM

## 2016-08-13 DIAGNOSIS — I5033 Acute on chronic diastolic (congestive) heart failure: Secondary | ICD-10-CM

## 2016-08-13 DIAGNOSIS — M1990 Unspecified osteoarthritis, unspecified site: Secondary | ICD-10-CM | POA: Diagnosis present

## 2016-08-13 DIAGNOSIS — N183 Chronic kidney disease, stage 3 (moderate): Secondary | ICD-10-CM

## 2016-08-13 DIAGNOSIS — I509 Heart failure, unspecified: Secondary | ICD-10-CM | POA: Diagnosis present

## 2016-08-13 DIAGNOSIS — I214 Non-ST elevation (NSTEMI) myocardial infarction: Secondary | ICD-10-CM

## 2016-08-13 DIAGNOSIS — M159 Polyosteoarthritis, unspecified: Secondary | ICD-10-CM

## 2016-08-13 DIAGNOSIS — I10 Essential (primary) hypertension: Secondary | ICD-10-CM

## 2016-08-13 DIAGNOSIS — E1165 Type 2 diabetes mellitus with hyperglycemia: Secondary | ICD-10-CM

## 2016-08-13 DIAGNOSIS — M2550 Pain in unspecified joint: Secondary | ICD-10-CM

## 2016-08-13 DIAGNOSIS — E871 Hypo-osmolality and hyponatremia: Secondary | ICD-10-CM

## 2016-08-13 HISTORY — DX: Acute on chronic diastolic (congestive) heart failure: I50.33

## 2016-08-13 HISTORY — DX: Non-ST elevation (NSTEMI) myocardial infarction: I21.4

## 2016-08-13 LAB — CBC AND DIFFERENTIAL
Absolute NRBC: 0.03 10*3/uL — ABNORMAL HIGH
Basophils Absolute Automated: 0.01 10*3/uL (ref 0.00–0.20)
Basophils Automated: 0.1 %
Eosinophils Absolute Automated: 0.66 10*3/uL (ref 0.00–0.70)
Eosinophils Automated: 5.5 %
Hematocrit: 26.1 % — ABNORMAL LOW (ref 42.0–52.0)
Hgb: 9.4 g/dL — ABNORMAL LOW (ref 13.0–17.0)
Immature Granulocytes Absolute: 0.11 10*3/uL — ABNORMAL HIGH
Immature Granulocytes: 0.9 %
Lymphocytes Absolute Automated: 1.07 10*3/uL (ref 0.50–4.40)
Lymphocytes Automated: 9 %
MCH: 31.1 pg (ref 28.0–32.0)
MCHC: 36 g/dL (ref 32.0–36.0)
MCV: 86.4 fL (ref 80.0–100.0)
MPV: 11.1 fL (ref 9.4–12.3)
Monocytes Absolute Automated: 0.89 10*3/uL (ref 0.00–1.20)
Monocytes: 7.5 %
Neutrophils Absolute: 9.16 10*3/uL — ABNORMAL HIGH (ref 1.80–8.10)
Neutrophils: 77 %
Nucleated RBC: 0.3 /100 WBC (ref 0.0–1.0)
Platelets: 251 10*3/uL (ref 140–400)
RBC: 3.02 10*6/uL — ABNORMAL LOW (ref 4.70–6.00)
RDW: 14 % (ref 12–15)
WBC: 11.9 10*3/uL — ABNORMAL HIGH (ref 3.50–10.80)

## 2016-08-13 LAB — COMPREHENSIVE METABOLIC PANEL
ALT: 17 U/L (ref 0–55)
AST (SGOT): 30 U/L (ref 5–34)
Albumin/Globulin Ratio: 1.1 (ref 0.9–2.2)
Albumin: 3.3 g/dL — ABNORMAL LOW (ref 3.5–5.0)
Alkaline Phosphatase: 69 U/L (ref 38–106)
Anion Gap: 11 (ref 5.0–15.0)
BUN: 100.7 mg/dL — ABNORMAL HIGH (ref 9.0–28.0)
Bilirubin, Total: 0.5 mg/dL (ref 0.2–1.2)
CO2: 20 mEq/L — ABNORMAL LOW (ref 22–29)
Calcium: 8.7 mg/dL (ref 7.9–10.2)
Chloride: 102 mEq/L (ref 100–111)
Creatinine: 4.1 mg/dL — ABNORMAL HIGH (ref 0.7–1.3)
Globulin: 2.9 g/dL (ref 2.0–3.6)
Glucose: 246 mg/dL — ABNORMAL HIGH (ref 70–100)
Potassium: 4.5 mEq/L (ref 3.5–5.1)
Protein, Total: 6.2 g/dL (ref 6.0–8.3)
Sodium: 133 mEq/L — ABNORMAL LOW (ref 136–145)

## 2016-08-13 LAB — PT AND APTT
PT INR: 1 (ref 0.9–1.1)
PT: 13.3 s (ref 12.6–15.0)
PTT: 26 s (ref 23–37)

## 2016-08-13 LAB — GFR: EGFR: 13.8

## 2016-08-13 LAB — GLUCOSE WHOLE BLOOD - POCT
Whole Blood Glucose POCT: 221 mg/dL — ABNORMAL HIGH (ref 70–100)
Whole Blood Glucose POCT: 201 mg/dL — ABNORMAL HIGH (ref 70–100)
Whole Blood Glucose POCT: 127 mg/dL — ABNORMAL HIGH (ref 70–100)
Whole Blood Glucose POCT: 205 mg/dL — ABNORMAL HIGH (ref 70–100)

## 2016-08-13 LAB — ECG 12-LEAD
Atrial Rate: 64 {beats}/min
P-R Interval: 142 ms
Q-T Interval: 622 ms
QRS Duration: 184 ms
QTC Calculation (Bezet): 641 ms
R Axis: 263 degrees
T Axis: 269 degrees
Ventricular Rate: 64 {beats}/min

## 2016-08-13 LAB — B-TYPE NATRIURETIC PEPTIDE: B-Natriuretic Peptide: 7758.8 pg/mL — ABNORMAL HIGH (ref 0.0–100.0)

## 2016-08-13 LAB — TROPONIN I
Troponin I: 1.45 ng/mL (ref 0.00–0.09)
Troponin I: 1.24 ng/mL (ref 0.00–0.09)
Troponin I: 1.16 ng/mL (ref 0.00–0.09)

## 2016-08-13 LAB — LYME AB, TOTAL,REFLEX TO WESTERN BLOT (IGG & IGM): Lyme AB,Total,Reflx to WB(IGM): 0.03 (ref 0.00–0.90)

## 2016-08-13 LAB — MAGNESIUM: Magnesium: 2.7 mg/dL — ABNORMAL HIGH (ref 1.6–2.6)

## 2016-08-13 LAB — CK
Creatine Kinase (CK): 61 U/L (ref 47–267)
Creatine Kinase (CK): 66 U/L (ref 47–267)
Creatine Kinase (CK): 121 U/L (ref 47–267)

## 2016-08-13 LAB — HEMOGLOBIN A1C
Average Estimated Glucose: 157.1 mg/dL
Hemoglobin A1C: 7.1 % — ABNORMAL HIGH (ref 4.6–5.9)

## 2016-08-13 LAB — PHOSPHORUS: Phosphorus: 4.3 mg/dL (ref 2.3–4.7)

## 2016-08-13 MED ORDER — FUROSEMIDE 10 MG/ML IJ SOLN
40.0000 mg | Freq: Two times a day (BID) | INTRAMUSCULAR | Status: DC
Start: 2016-08-13 — End: 2016-08-13
  Administered 2016-08-13: 08:00:00 40 mg via INTRAVENOUS
  Filled 2016-08-13: qty 4

## 2016-08-13 MED ORDER — INSULIN LISPRO 100 UNIT/ML SC SOLN
1.0000 [IU] | Freq: Three times a day (TID) | SUBCUTANEOUS | Status: DC | PRN
Start: 2016-08-13 — End: 2016-08-16
  Administered 2016-08-13: 12:00:00 2 [IU] via SUBCUTANEOUS
  Administered 2016-08-14: 13:00:00 3 [IU] via SUBCUTANEOUS
  Administered 2016-08-14: 18:00:00 4 [IU] via SUBCUTANEOUS
  Administered 2016-08-14: 09:00:00 1 [IU] via SUBCUTANEOUS
  Administered 2016-08-15: 08:00:00 3 [IU] via SUBCUTANEOUS
  Administered 2016-08-15: 13:00:00 4 [IU] via SUBCUTANEOUS
  Administered 2016-08-15 – 2016-08-16 (×3): 3 [IU] via SUBCUTANEOUS
  Filled 2016-08-13: qty 6
  Filled 2016-08-13 (×2): qty 9
  Filled 2016-08-13: qty 3
  Filled 2016-08-13 (×2): qty 9
  Filled 2016-08-13 (×2): qty 12
  Filled 2016-08-13: qty 9

## 2016-08-13 MED ORDER — ENOXAPARIN SODIUM 80 MG/0.8ML SC SOLN
1.0000 mg/kg | SUBCUTANEOUS | Status: DC
Start: 2016-08-13 — End: 2016-08-13
  Administered 2016-08-13: 06:00:00 70 mg via SUBCUTANEOUS
  Filled 2016-08-13: qty 0.8

## 2016-08-13 MED ORDER — ENOXAPARIN SODIUM 30 MG/0.3ML SC SOLN
30.0000 mg | Freq: Every day | SUBCUTANEOUS | Status: DC
Start: 2016-08-13 — End: 2016-08-13

## 2016-08-13 MED ORDER — ASPIRIN 325 MG PO TBEC
325.0000 mg | DELAYED_RELEASE_TABLET | Freq: Every day | ORAL | Status: DC
Start: 2016-08-13 — End: 2016-08-13

## 2016-08-13 MED ORDER — NIFEDIPINE ER OSMOTIC RELEASE 30 MG PO TB24
30.0000 mg | ORAL_TABLET | Freq: Every day | ORAL | Status: DC
Start: 2016-08-13 — End: 2016-08-17
  Administered 2016-08-13 – 2016-08-17 (×4): 30 mg via ORAL
  Filled 2016-08-13 (×5): qty 1

## 2016-08-13 MED ORDER — ENOXAPARIN SODIUM 30 MG/0.3ML SC SOLN
30.0000 mg | Freq: Every day | SUBCUTANEOUS | Status: DC
Start: 2016-08-14 — End: 2016-08-17
  Administered 2016-08-14 – 2016-08-17 (×4): 30 mg via SUBCUTANEOUS
  Filled 2016-08-13 (×4): qty 0.3

## 2016-08-13 MED ORDER — SODIUM CHLORIDE 0.9 % IV SOLN
INTRAVENOUS | Status: DC
Start: 2016-08-13 — End: 2016-08-14

## 2016-08-13 MED ORDER — INSULIN GLARGINE 100 UNIT/ML SC SOLN
8.0000 [IU] | Freq: Every evening | SUBCUTANEOUS | Status: DC
Start: 2016-08-13 — End: 2016-08-13

## 2016-08-13 MED ORDER — GLUCAGON 1 MG IJ SOLR (WRAP)
1.0000 mg | INTRAMUSCULAR | Status: DC | PRN
Start: 2016-08-13 — End: 2016-08-17

## 2016-08-13 MED ORDER — ONDANSETRON HCL 4 MG/2ML IJ SOLN
4.0000 mg | Freq: Four times a day (QID) | INTRAMUSCULAR | Status: DC | PRN
Start: 2016-08-13 — End: 2016-08-17

## 2016-08-13 MED ORDER — FUROSEMIDE 10 MG/ML IJ SOLN
40.0000 mg | Freq: Two times a day (BID) | INTRAMUSCULAR | Status: DC
Start: 2016-08-13 — End: 2016-08-13

## 2016-08-13 MED ORDER — INSULIN LISPRO 100 UNIT/ML SC SOLN
1.0000 [IU] | Freq: Every evening | SUBCUTANEOUS | Status: DC | PRN
Start: 2016-08-13 — End: 2016-08-16
  Administered 2016-08-13: 21:00:00 1 [IU] via SUBCUTANEOUS
  Administered 2016-08-15: 23:00:00 2 [IU] via SUBCUTANEOUS
  Administered 2016-08-15: 01:00:00 1 [IU] via SUBCUTANEOUS
  Filled 2016-08-13: qty 6
  Filled 2016-08-13 (×2): qty 3

## 2016-08-13 MED ORDER — DEXTROSE 50 % IV SOLN
12.5000 g | INTRAVENOUS | Status: DC | PRN
Start: 2016-08-13 — End: 2016-08-17

## 2016-08-13 MED ORDER — ACETAMINOPHEN 325 MG PO TABS
650.0000 mg | ORAL_TABLET | Freq: Four times a day (QID) | ORAL | Status: DC | PRN
Start: 2016-08-13 — End: 2016-08-17
  Administered 2016-08-13 – 2016-08-16 (×5): 650 mg via ORAL
  Filled 2016-08-13 (×5): qty 2

## 2016-08-13 MED ORDER — ASPIRIN 325 MG PO TBEC
325.0000 mg | DELAYED_RELEASE_TABLET | Freq: Every day | ORAL | Status: DC
Start: 2016-08-13 — End: 2016-08-17
  Administered 2016-08-13 – 2016-08-17 (×5): 325 mg via ORAL
  Filled 2016-08-13 (×6): qty 1

## 2016-08-13 MED ORDER — PANTOPRAZOLE SODIUM 40 MG PO TBEC
40.0000 mg | DELAYED_RELEASE_TABLET | Freq: Every morning | ORAL | Status: DC
Start: 2016-08-13 — End: 2016-08-17
  Administered 2016-08-13 – 2016-08-17 (×5): 40 mg via ORAL
  Filled 2016-08-13 (×5): qty 1

## 2016-08-13 MED ORDER — ATORVASTATIN CALCIUM 20 MG PO TABS
40.0000 mg | ORAL_TABLET | Freq: Every evening | ORAL | Status: DC
Start: 2016-08-13 — End: 2016-08-17
  Administered 2016-08-13 – 2016-08-17 (×5): 40 mg via ORAL
  Filled 2016-08-13 (×5): qty 2

## 2016-08-13 MED ORDER — CYCLOBENZAPRINE HCL 10 MG PO TABS
10.0000 mg | ORAL_TABLET | Freq: Three times a day (TID) | ORAL | Status: DC | PRN
Start: 2016-08-13 — End: 2016-08-17
  Administered 2016-08-13 – 2016-08-14 (×3): 10 mg via ORAL
  Filled 2016-08-13 (×3): qty 1

## 2016-08-13 MED ORDER — GLUCOSE 40 % PO GEL
15.0000 g | ORAL | Status: DC | PRN
Start: 2016-08-13 — End: 2016-08-17

## 2016-08-13 MED ORDER — GABAPENTIN 100 MG PO CAPS
100.0000 mg | ORAL_CAPSULE | Freq: Three times a day (TID) | ORAL | Status: DC
Start: 2016-08-13 — End: 2016-08-17
  Administered 2016-08-13 – 2016-08-17 (×13): 100 mg via ORAL
  Filled 2016-08-13 (×13): qty 1

## 2016-08-13 MED ORDER — SODIUM CHLORIDE 0.9 % IV SOLN
INTRAVENOUS | Status: DC
Start: 2016-08-13 — End: 2016-08-13

## 2016-08-13 MED ORDER — DOCUSATE SODIUM 100 MG PO CAPS
100.0000 mg | ORAL_CAPSULE | Freq: Two times a day (BID) | ORAL | Status: DC
Start: 2016-08-13 — End: 2016-08-14
  Administered 2016-08-13 – 2016-08-14 (×3): 100 mg via ORAL
  Filled 2016-08-13 (×3): qty 1

## 2016-08-13 NOTE — Progress Notes (Signed)
Transitional Care Management     Writer met with the patient's grandson. Writer provided an introduction and information about the Transitional Care Management (TCM) program and outpatient case manager's role following discharge for education and management; patient verbalized confirmation and understanding. The patient's grandson identified daughter, Arvine Clayburn, as the primary contact post discharge: identified [phone] as best contact number. Writer provided the patient with a TCM program brochure and writer's contact information for follow-up post discharge. Writer will continue to monitor the patient's discharge plan for appropriateness of TCM program involvement post discharge.    Gae Dry, RN MSN  [employer] [facility]  Case Manager  [phone]

## 2016-08-13 NOTE — Plan of Care (Signed)
Problem: Moderate/High Fall Risk Score >5  Goal: Patient will remain free of falls  Outcome: Progressing   08/13/16 1142   OTHER   High (Greater than 13) LOW-Fall Interventions Appropriate for Low Fall Risk;LOW-Anticoagulation education for injury risk       Problem: Safety  Goal: Patient will be free from injury during hospitalization  Outcome: Progressing   08/13/16 1142   Goal/Interventions addressed this shift   Patient will be free from injury during hospitalization  Assess patient's risk for falls and implement fall prevention plan of care per policy;Use appropriate transfer methods;Provide and maintain safe environment;Provide alternative method of communication if needed [employer], writing);Hourly rounding;Include patient/ family/ care giver in decisions related to safety     Goal: Patient will be free from infection during hospitalization  Outcome: Progressing   08/13/16 1142   Goal/Interventions addressed this shift   Free from Infection during hospitalization Assess and monitor for signs and symptoms of infection;Monitor lab/diagnostic results       Problem: Pain  Goal: Pain at adequate level as identified by patient  Outcome: Progressing   08/13/16 1142   Goal/Interventions addressed this shift   Pain at adequate level as identified by patient Identify patient comfort function goal;Assess for risk of opioid induced respiratory depression, including snoring/sleep apnea. Alert healthcare team of risk factors identified.;Assess pain on admission, during daily assessment and/or before any "as needed" intervention(s);Reassess pain within 30-60 minutes of any procedure/intervention, per Pain Assessment, Intervention, Reassessment (AIR) Cycle;Include patient/patient care companion in decisions related to pain management as needed;Consult/collaborate with Physical Therapy, Occupational Therapy, and/or Speech Therapy       Problem: Side Effects from Pain Analgesia  Goal: Patient will experience  minimal side effects of analgesic therapy  Outcome: Progressing   08/13/16 1142   Goal/Interventions addressed this shift   Patient will experience minimal side effects of analgesic therapy Monitor/assess patient's respiratory status (RR depth, effort, breath sounds);Assess for changes in cognitive function;Evaluate for opioid-induced sedation with appropriate assessment tool (i.e. POSS)       Problem: Discharge Barriers  Goal: Patient will be discharged home or other facility with appropriate resources  Outcome: Progressing   08/13/16 1142   Goal/Interventions addressed this shift   Discharge to home or other facility with appropriate resources Provide appropriate patient education;Provide information on available health resources;Initiate discharge planning       Problem: Psychosocial and Spiritual Needs  Goal: Demonstrates ability to cope with hospitalization/illness  Outcome: Progressing   08/13/16 1142   Goal/Interventions addressed this shift   Demonstrates ability to cope with hospitalizations/illness Encourage verbalization of feelings/concerns/expectations;Provide quiet environment

## 2016-08-13 NOTE — Plan of Care (Signed)
Problem: Heart Failure  Goal: Stable vital signs and fluid balance  Outcome: Progressing   08/13/16 1148   Goal/Interventions addressed this shift   Stable vital signs and fluid balance Monitor/assess vital signs and telemetry per unit protocol;Monitor lab values;Monitor intake/output per unit protocol and/or LIP order;Monitor for leg swelling/edema and report to LIP if abnormal;Assess signs and symptoms associated with cardiac rhythm changes     Goal: Mobility/Activity is maintained at optimal level for patient  Outcome: Progressing   08/13/16 1148   Goal/Interventions addressed this shift   Mobility/activity is maintained at optimal level for patient Increase mobility as tolerated/progressive mobility;Perform active/passive ROM;Assess for changes in respiratory status, level of consciousness and/or development of fatigue;Consult/collaborate with Physical Therapy and/or Occupational Therapy     Goal: Nutritional intake is adequate  Outcome: Progressing   08/13/16 1148   Goal/Interventions addressed this shift   Nutritional intake is adequate Consult/collaborate with Speech Therapy (swallow evaluations);Include patient/patient care companion in decisions related to nutrition;Encourage/perform oral hygiene as appropriate

## 2016-08-13 NOTE — Consults (Signed)
[facility]  Consultation    Date Time: 08/13/16 11:50 AM  Patient Name: [facility]  Medical Record Number: [id number]   Primary Care Physician: @[email or web address] @   Requesting Physician: Carney Bern, MD    Reason for Consultation:   AKI  Assessment:    AKI - prerenal due to poor oral intake- Renal USG RK 10cm, LK 10.5cm   CKD stage IIIb- Etiology HTN- baseline Cr 2-2.3 since 2017   Generalized aches and pain- chronic on review of his previous records in EPIC- Arthritis?   Mild hyponatremia   Diastolic CHF   Sever valve disease- Severe MR, Severe TR, Mild AS   DM2   HTN   Anemia of CKD  Plan:    Start NS at 75 ml an hour for 1L   Restrict fluid intake to 1L   Hold diuretics   Ordered iron studies   May need X ray of knee joints to assess degree of arthritis   Pain control   Strict I/O   Daily labs   Will follow   Plan d/w primary attending    We will follow this patient closely with you. Thank you for allowing [place] to participate in the care of this patient.    Dayna Barker, MD  Office - [phone]  [employer] Link - [phone]    History:   Conrad Zajkowski is a 83 y.o. male with PMH of CKD, HTN, Diastolic CHF [facility] 19% 7/58/83, Severe valve disease, Pacemaker 02/2016 who presents to the hospital on 08/12/2016 with complaints of generalized pains and aches. Found to have AKI on CKD.  His baseline Cr on review of records 2.3-2.0 (06/03/15- 08/08/16), Cr today 4.1  UA protein 30, RBC 0-2, WBC 0-5. Pro BNP 7700, CXR mild pulm congestion. CK 66. On arrival he has received 40 mg Iv lasix.   Denies any NSAIDs intake    Past Medical and Surgical  History:     Past Medical History:   Diagnosis Date   . Congestive heart failure (CHF)    . Diabetes mellitus    . Gout     chronic   . Hypertension      Past Surgical History:   Procedure Laterality Date   . ABDOMINAL SURGERY       Family History:   History reviewed. No pertinent family history.  Social History:     Social History     Social History   .  Marital status: Married     Spouse name: N/A   . Number of children: N/A   . Years of education: N/A     Occupational History   . Not on file.     Social History Main Topics   . Smoking status: Never Smoker   . Smokeless tobacco: Never Used   . Alcohol use No      Comment: former alcoholic   . Drug use: No   . Sexual activity: Not on file     Other Topics Concern   . Not on file     Social History Narrative   . No narrative on file     Allergies:   No Known Allergies  Medications:     Current Facility-Administered Medications   Medication Dose Route Frequency   . aspirin EC  325 mg Oral Daily   . atorvastatin  40 mg Oral QPM   . docusate sodium  100 mg Oral BID   . enoxaparin  1 mg/kg Subcutaneous Q24H   .  furosemide  40 mg Intravenous Q12H   . gabapentin  100 mg Oral TID   . NIFEdipine  30 mg Oral Daily   . pantoprazole  40 mg Oral QAM AC     Review of Systems:   10 systems were reviewed and were negative  Physical Exam:   Temp:  [97 F (36.1 C)-98.2 F (36.8 C)] 97.5 F (36.4 C)  Heart Rate:  [59-91] 91  Resp Rate:  [16-18] 16  BP: (133-168)/(58-73) 153/73    Intake and Output Summary (Last 24 hours) at Date Time    Intake/Output Summary (Last 24 hours) at 08/13/16 1150  Last data filed at 08/13/16 1100   Gross per 24 hour   Intake             1715 ml   Output                0 ml   Net             1715 ml       Gen: WN WD NAD  HEENT: NC/AT, moist MM, clear oropharynx  Neck: No JVD, Supple  CV: S1 S2 N RRR no M/R/G/C, no edema  Chest: Good effort, CTAB, symmetric expansion  Ab: ND NT Soft no HSM +BS  Skin: Dry, intact  Ext: Warm, no cords  Psych: Appropriate mood and affect    Labs:     Recent Labs  Lab 08/13/16  0115 08/12/16  1842 08/08/16  0133   Glucose 246*  --  92   BUN 100.7*  --  33.0*   Creatinine 4.1*  --  2.0*   i-STAT Creatinine  --  5.30*  --    Calcium 8.7  --  9.9   Sodium 133*  --  142   Potassium 4.5  --  4.3   Chloride 102  --  105   CO2 20*  --  25   Albumin 3.3* 3.4* 4.4   Phosphorus 4.3  --    --    Magnesium 2.7*  --   --        Recent Labs  Lab 08/13/16  0115 08/12/16  1842 08/08/16  0133   WBC 11.90* 11.23* 13.19*   RBC 3.02* 3.14* 3.63*   Hgb 9.4* 9.8* 11.2*   Hematocrit 26.1* 27.2* 33.5*   MCV 86.4 86.6 92.3   MCH 31.1 31.2 30.9   MCHC 36.0 36.0 33.4   RDW 14 14 15    MPV 11.1 11.4 9.9   Platelets 251 262 251     Radiology:   Radiological Procedure reviewed.   EKG:   Reviewed  Prior Records:   I reviewed the old records.

## 2016-08-13 NOTE — Progress Notes (Signed)
Pt very sleepy all day, awoken by family for meds, tylenol given for pain, message sent to Dr. Raynald Blend in regards to low grade temp no call back.

## 2016-08-13 NOTE — Plan of Care (Signed)
Problem: Safety  Goal: Patient will be free from injury during hospitalization  Outcome: Progressing   08/13/16 0313   Goal/Interventions addressed this shift   Patient will be free from injury during hospitalization  Assess patient's risk for falls and implement fall prevention plan of care per policy;Provide and maintain safe environment;Use appropriate transfer methods;Ensure appropriate safety devices are available at the bedside;Include patient/ family/ care giver in decisions related to safety;Hourly rounding;Provide alternative method of communication if needed (communication boards, writing)     Goal: Patient will be free from infection during hospitalization  Outcome: Progressing   08/13/16 0313   Goal/Interventions addressed this shift   Free from Infection during hospitalization Assess and monitor for signs and symptoms of infection;Monitor lab/diagnostic results;Monitor all insertion sites (i.e. indwelling lines, tubes, urinary catheters, and drains)

## 2016-08-13 NOTE — Progress Notes (Signed)
Pt is a 83 y.o male admitted w/ CHF, NSTEMI and AKI. Met w/ pt's daughter, [name] and grandson, Georgina Snell at bedside. Pt was asleep. Pt speaks [place] per daughter. Pt and his wife live w/ daughter and grandson. Pt has Medicaid Waiver aide services for 8 hrs a day, 7 days a week through [place]. Prior to this admission, pt was independent w/ ambulation and ADLs using a cane per Dtr.     Informed daughter about P.T recommendations for SNF and that pt was unable to stand today. Daughter declined SNF and wants her father to return home. Explained to that pt would need more than one person's assistance. Per daughter, her adult son, Georgina Snell will be home to assist along with the [facility] aide. Discussed that pt needs someone with him at all times.    Referrals made to [facility] Liaison for skilled [facility] services and a W/C.        08/13/16 1649   Patient Type   Within 30 Days of Previous Admission? No   Healthcare Decisions   Interviewed: Family  (Pt was asleep)   Name of interviewee if other than the pt: Daughter, Son Boissonneault and Jaquavis, Felmlee   Orientation/Decision Making Abilities of Patient (Unable to assess as pt was asleep during visit. )   Healthcare Agent Appointed No   Additional Emergency Contacts? Daughter, Son Zahler and Hayk, Divis - [phone]   Prior to admission   Prior level of function Independent with ADLs;Ambulates with assistive device  (Uses a cane)   Type of Residence Private residence   [place] to enter with rails (add number in comment);1/2 bath on main level;Able to live on main level with bedroom/bathroom;Multi-level  (5 STE)   Living Arrangements Family members  (Pt and wife live with their daughter, [name] and grandson)   How do you get to your MD appointments? Daughter drives   How do you get your groceries? Daughter/Grandson   Who fixes your meals? Daughter   Who picks up your prescriptions? Daughter   Dressing Independent   Grooming Independent   Feeding Independent   Bathing Independent    Toileting Independent   DME Currently at Home Single point cane;Front wheel walker;3-in-1 [place] Yes, Pt has Medicaid Waiver aide for 8 hrs a day, 7 days a week through [place].    Prior SNF admission? (Detail) No   Discharge Planning   Support Systems Children;Family members   Patient expects to be discharged to: Home w/ 24/7 care and skilled [facility] services   Anticipated [place] plan discussed with: Same as interviewed   Mode of transportation: Other  (TBD based on pt's functional status at the time of D/C. )   Consults/Providers   PT Evaluation Needed 1  (Yes)   OT Evalulation Needed 1  (Yes)   Correct PCP listed in Epic? Yes   Important Message from [facility] Notice   Patient received 1st IMM Letter? Yes   Date of most recent IMM given: 08/12/16

## 2016-08-13 NOTE — OT Eval Note (Signed)
Marco Bruce  [place]  [phone]    Occupational Therapy Evaluation    Patient: Marco Bruce    MRN#: [id number]     [id number]/[id number]    Time of treatment: Time Calculation  OT Received On: 08/13/16  Start Time: 1220  Stop Time: 1240  Time Calculation (min): 20 min       Consult received for Marco Bruce for OT Evaluation and Treatment.  Patient's medical condition is appropriate for Occupational therapy intervention at this time.    Assessment:   Marco Bruce is a 83 y.o. male admitted 08/12/2016.   Expanded chart review completed including review of labs, review of vitals, calling pt's family/caregiver for thorough history, review of H&P and physician progress notes and review of consulting physician notes .  Pt's ability to complete ADLs and functional transfers is impaired due to the following deficits:  decreased activity tolerance, decreased balance, decreased bed mobility, decreased safety awareness, decreased problem solving and decreased strength.  Pt demonstrates performance deficits with grooming, bathing, dressing, toileting and functional mobility. There are many comorbidities or other factors that affect plan of care and require modification of task including: assistive device needed for mobility, chronic pain, has stairs to manage and home alone for a portion of the day.  Pt would continue to benefit from OT to address these deficits and increase functional independence.    Assessment: decreased strength;balance deficits;decreased independence with ADLs;decreased safety awareness;decreased cognition;decreased independence with IADLs;decreased endurance/activity tolerance     Complexity Chart Review Performance Deficits Clinical Decision Making Hx/Comorbidities Assistance needed   Low Brief 1-3 Limited options None None (or at baseline)   Moderate Expanded 3-5 Several Options 1-2 Min/Mod assist (not at baseline)   High Extensive 5  or more Multiple options 3 or more Max/dependent (not at baseline     Therapy Diagnosis: generalized weakness, decreased functional mobility , decreased independence with ADL's and decreased endurance/ activity engagement due to HPI. Without therapy interventions, patient is at risk for falls, dependence on caregivers for mobility, dependence on caregivers for ADL's, failure to return to PLOF and decreased quality of life.    Rehabilitation Potential: Prognosis: Fair;With continued OT s/p acute discharge;With family      Plan:   OT Frequency Recommended: 3-4x/wk   Treatment Interventions: ADL retraining;Functional transfer training;UE strengthening/ROM;Endurance training;Patient/Family training;Equipment eval/education;Neuro muscular reeducation          Risks/Benefits/POC Discussed with Pt/Family: With patient/family    Goals:   Goal Formulation: Patient;Family  Time For Goal Achievement: 5 visits  ADL Goals  Patient will feed self: Modified Independent;3 visits  Patient will groom self: Supervision;3 visits  Mobility and Transfer Goals  Pt will perform functional transfers: Minimal Assist;with rolling walker;3 visits  Neuro Re-Ed Goals  Pt will sit at edge of bed: Minimal Assist;for 5 minutes;to prepare for OOB tasks;3 visits                      Discharge Recommendations:   Based on today's session patient's discharge recommendation is the following: Discharge Recommendation: SNF.     If Discharge Recommendation: SNF is not available, then the patient will need home health services, increase supervision , assistance with mobility, assistance with ADL's and assistance with IADL's.  DME Recommended for Discharge: Front wheel walker        Precautions and Contraindications:   Precautions  Weight Bearing Status: no restrictions  Other Precautions:  falls      Medical Diagnosis: Polyarthralgia [M25.50]  Intractable pain [R52]  Acute renal failure, unspecified acute renal failure type [N17.9]    History of Present  Illness: Marco Bruce is a 83 y.o. male admitted on 08/12/2016 with came with the chief complaint of diffuse joint pain since 1 week ago. Pt is a poor historian due to language barrier, history obtained from grandson at bedside who was also translating. Diffuse joint pain is chronic, involves his shoulder, elbow, knee and ankle bilaterally, occurring over years, however, started worsening this past week. Pt was seen in the ER on 08/08/16 and given percocet. However, Pt's diffuse joint pain continued to worsen this past 2 days, and Pt was also noticed to be SOB this past 2 days, increased w/ exertion and accompanied w/ coughing w/ productive mucous. Pt denies orthopnea, PND, wheezing or leg swelling. Pt has had decreased oral intake over the past week due to diffuse pain. In the ER pt was found to have a Cr of 5.3, increased from his baseline of 2.0.(Per H&P)      Patient Active Problem List   Diagnosis   . SOB (shortness of breath)   . ARF (acute renal failure)   . Pulmonary edema   . Acute pulmonary edema   . Acute renal failure, unspecified acute renal failure type   . CKD (chronic kidney disease), unspecified stage   . Acute diastolic heart failure   . Bruce discharge follow-up   . Peripheral edema   . Pneumonia due to infectious organism, unspecified laterality, unspecified part of lung   . Chronic diastolic heart failure   . CKD (chronic kidney disease) stage 4, GFR 15-29 ml/min   . Chronic pain syndrome   . Arthritis   . Uncontrolled type 2 diabetes with renal manifestation   . Renovascular hypertension   . NSTEMI (non-ST elevated myocardial infarction)   . CHF (congestive heart failure)   . Acute renal failure superimposed on stage 3 chronic kidney disease   . Hyponatremia   . Normocytic anemia   . HTN (hypertension)   . Diabetes mellitus, type II   . OA (osteoarthritis)   . Acute on chronic diastolic congestive heart failure        Past Medical/Surgical History:  Past Medical History:   Diagnosis Date   .  Congestive heart failure (CHF)    . Diabetes mellitus    . Gout     chronic   . Hypertension       Past Surgical History:   Procedure Laterality Date   . ABDOMINAL SURGERY           X-Rays/Tests/Labs:      Social History:  Prior Level of Function  Prior level of function: Independent with ADLs, Ambulates with assistive device  Assistive Device: Single point cane  Baseline Activity Level: Household ambulation  Driving: does not drive  Cooking: No  DME Currently at Home: Single point cane  Home Living Arrangements  Living Arrangements: Family members  Type of Home: House  Home Layout: Stairs to enter with rails (add number in comment), 1/2 bath on main level, Able to live on main level with bedroom/bathroom (12)  Bathroom Shower/Tub: Tourist information centre manager: Soil scientist: Civil engineer, contracting, Grab bars in shower  DME Currently at Home: Single point cane  Home Living - Notes / Comments: pt stays with son and daughter in [place].      Subjective:   Patient is agreeable  to participation in the therapy session. Nursing clears patient for therapy.      .        Objective:   Observation of Patient/Vital Signs:  Patient is in bed with telemetry, SCD's and peripheral IV in place.         Cognition/Neuro Status  Arousal/Alertness: Delayed responses to stimuli  Attention Span: Difficulty attending to directions  Orientation Level: Oriented to person  Memory: Unable to assess  Following Commands: maximal verbal instruction  Safety Awareness: maximal verbal instruction  Insights: Not aware of deficits;Educated in safety awareness  Problem Solving: dependent  Motor Planning: decreased processing speed;decreased initiation  Hand Dominance: right handed    Gross ROM  Right Upper Extremity ROM: within functional limits  Left Upper Extremity ROM: within functional limits  Gross Strength  Right Upper Extremity Strength: 3/5 (on observation)  Left Upper Extremity Strength: 3/5 (on observation)          Sensory  Auditory:  intact       Self-care and Home Management  Functional Transfers: Dependent    Mobility and Transfers  Rolling: Maximal Assist;to Left  Scooting to EOB: Maximal Assist  Supine to Sit: Maximal Assist  Sit to Supine: Maximal Assist  Sit to Stand: Dependent     Balance  Static Sitting Balance: poor (severe L sided and forward lean.)  Dyanamic Sitting Balance: poor    Participation and Endurance  Participation Effort: poor  Endurance: Tolerates < 10 min exercise, no significant change in vital signs    AM-PACT "6 Clicks" Daily Activity Inpatient Short Form  Inpatient AM-PACT Performed?: yes  Put On/Take Off Lower Body Clothing: Total  Assist with Bathing: Total  Assist with Toileting: Total  Put On/Take Off Upper Body Clothing: Total  Assist with Grooming: Total  Assist with Eating: Total  OT Daily Activity Raw Score: 6  CMS 0-100% Score: 100.00%    PMP - Progressive Mobility Protocol   PMP Activity: Step 4 - Dangle at Bedside    Treatment Activities: Pt was seen this PM for evaluation. Pt appeared very drowsy.Pt presented with poor level of arousal and ability to actively participate in treatment this date. Pt noted to attempt some level of arousal in order to socially communicate with this therapist, however pt unable to maintain appropriate level of arousal to engage in gross motor tasks.   Pt's grandson educated on home safety and fall prevention strategies. Educated pt and family on importance of OOB mobility for overall strength and endurance, respiratory health, circulation and skin integrity. Also encouraged pt to sit up in bed as able to maintain strength and endurance for ADL's. Pt's grandson voiced understanding.  Educated the patient to role of occupational therapy, plan of care, goals of therapy and HEP, safety with mobility and ADLs, home safety. Pt left supine in bed at the end of the session.Call bell placed within reach. Alarm activated. RN notified of the outcome.    Rutherford Guys ,OTR/L  [facility]  Physical Medicine and Rehab Department  Pager # [phone]

## 2016-08-13 NOTE — Discharge Instr - AVS First Page (Addendum)
Home Health Discharge Information     Your doctor has ordered Skilled Nursing, Physical Therapy, Occupational Therapy and Other Wheelchair in-home service(s) for you while you recuperate at home, to assist you in the transition from hospital to home.    The agency that you or your representative chose to provide the service:  Name of Marco Bruce: [facility] ([place]:  Name of DME Agency: [facility] [phone]) - Please call to confirm and schedule delivery   Equipment Ordered:   Wheelchair      The above services were set up by:  Johney Maine   RN, BSN(Home Health Liaison)   Phone       [phone]                                    Additional comments: If you have not heard from your home health agency within 24-48 hours after discharge please call your agency to arrange a time for your first visit.  For any scheduling concerns or questions related to home health, such as time or date please contact your home health agency at the number listed above.       Signed by: Johney Maine  Date Time: 08/13/16 3:59 PM      Please call [facility] at [phone] for transportation to [place] appointments.           Follow up with PCP in  1 week.       Medication Name:            Amoxicillin/Clavulanic acid(Augmentin)   This Medication is used for:   Bacterial Infections    Common Side Effects are:   Nausea   Upset Stomach   Diarrhea    A note from your nurse:  Call your nurse immediately if you notice itching, hives, swelling or trouble breathing       Medication: Lactobacillus(Risaquad)   This Medication is used for:   To promote good bacteria in the intestines    Common Side Effects are:   Gas   Bloating    A note from your nurse:  Call your nurse immediately if you notice itching, hives, swelling or trouble breathing       Medication: Prednisone(Deltasone)   This Medication is used  for:   Allergies   COPD   Asthma   Autoimmune conditions    Common Side Effects are:   High Blood sugars   High Blood pressure   Agitation   Upset stomach    A note from your nurse:  Call your nurse immediately if you notice itching, hives, swelling or trouble breathing         Allopurinol injection  Brand Name: Aloprim  What is this medicine?  ALLOPURINOL (al oh PURE i nole) reduces the amount of uric acid the body makes during chemotherapy. Too much uric acid in the blood can cause damage to your kidneys.  How should I use this medicine?  The medicine is for infusion into a vein. It is given by a health care professional in a hospital or clinic setting.  Talk to your pediatrician regarding the use of this medicine in children. Special care may be needed.  What side effects may I notice from receiving this medicine?  Side effects that you should report to your doctor or health care professional as soon  as possible:   allergic reactions like skin rash, itching or hives, swelling of the face, lips, or tongue   difficulty passing urine   loss of appetite   redness, blistering, peeling or loosening of the skin, including inside the mouth   unusual bleeding or bruising   unusually weak or tired   vomiting   yellowing of the skin or whites of the eyes  Side effects that usually do not require medical attention (report to your doctor or health care professional if they continue or are bothersome):   diarrhea   drowsiness   nausea   stomach pain  What may interact with this medicine?  Do not take this medicine with the following medication:   didanosine, ddI  This medicine may also interact with the following medications:   amoxicillin or ampicillin   azathioprine   certain medicines used to treat gout   chlorpropamide   cyclosporine   diuretics   mercaptopurine   probenecid   sulfinpyrazone   warfarin  What if I miss a dose?  This does not apply.  Where should I keep my medicine?  This drug  is given in a hospital or clinic and will not be stored at home.  What should I tell my health care provider before I take this medicine?  They need to know if you have any of these conditions:   kidney disease   liver disease   an unusual or allergic reaction to allopurinol, other medicines, foods, dyes, or preservatives   pregnant or trying to get pregnant   breast feeding  What should I watch for while using this medicine?  Your condition will be monitored carefully while you are receiving this medicine.  You may get drowsy or dizzy. Do not drive, use machinery, or do anything that needs mental alertness until you know how this medicine affects you. Do not stand or sit up quickly, especially if you are an older patient. This reduces the risk of dizzy or fainting spells.  Drink plenty of water while you are taking this medicine. This will help to reduce the risk of getting gout or kidney stones.  NOTE:This sheet is a summary. It may not cover all possible information. If you have questions about this medicine, talk to your doctor, pharmacist, or health care provider. Copyright 2017 Elsevier    Sodium Bicarbonate Oral tablet  What is this medicine?  SODIUM BICARBONATE (SOE dee um; bye KAR bon ate) is an antacid. It is used to treat acid indigestion and heartburn caused by too much acid in the stomach.  This medicine may be used for other purposes; ask your health care provider or pharmacist if you have questions.  What should I tell my health care provider before I take this medicine?  They need to know if you have any of these conditions:   Cushing's syndrome   heart problems   kidney disease   low levels of calcium or potassium in the blood   low salt or sodium diet   stomach ulcer   an unusual or allergic reaction to sodium bicarbonate, other medicines, foods, dyes, or preservatives   pregnant or trying to get pregnant   breast-feeding  How should I use this medicine?  Take this medicine by mouth.  Dissolve it in a glass of water before taking. Do not take tablets whole. Follow the directions on the package label. Take your medicine at regular intervals. Do not take it more often than directed.  Talk to your pediatrician regarding the use of this medicine in children. Special care may be needed.  Patients over 46 years old may have a stronger reaction and need a smaller dose.  Overdosage: If you think you have taken too much of this medicine contact a poison control center or emergency room at once.  NOTE: This medicine is only for you. Do not share this medicine with others.  What if I miss a dose?  If you miss a dose, take it as soon as you can. If it is almost time for your next dose, take only that dose. Do not take double or extra doses.  What may interact with this medicine?  Do not take this medicine with any of the following medications:   ammonium chloride  This medicine may also interact with the following medications:   antibiotics like ciprofloxacin, doxycycline, tetracycline   aspirin and aspirin-like medicines   delavirdine   ephedra, Ma Huang   itraconazole, ketoconazole   lithium   methenamine   norfloxacin   quinidine   quinine   stimulants like amphetamine, dexmethylphenidate, and others   tolmetin  This list may not describe all possible interactions. Give your health care provider a list of all the medicines, herbs, non-prescription drugs, or dietary supplements you use. Also tell them if you smoke, drink alcohol, or use illegal drugs. Some items may interact with your medicine.  What should I watch for while using this medicine?  Tell your doctor or healthcare professional if your symptoms do not start to get better or if they get worse. Do not take this medicine for more than 2 weeks unless your doctor directs you to. See your doctor if your symptoms return. You may have a serious medical condition.  What side effects may I notice from receiving this medicine?  Side effects  that you should report to your doctor or health care professional as soon as possible:   allergic reactions like skin rash, itching or hives, swelling of the face, lips, or tongue   confusion   dizziness   muscle pain   nausea, vomiting   seizures   swelling of the feet and legs   unusually weak or tired  Side effects that usually do not require medical attention (report to your doctor or health care professional if they continue or are bothersome):   bloating and stomach gas   increased thirst   stomach cramps  This list may not describe all possible side effects. Call your doctor for medical advice about side effects. You may report side effects to FDA at 1-800-FDA-1088.  Where should I keep my medicine?  Keep out of the reach of children.  Store at room temperature between 15 and 30 degrees C (59 and 86 degrees F). Keep container tightly closed. Throw away any unused medicine after the expiration date.  NOTE:This sheet is a summary. It may not cover all possible information. If you have questions about this medicine, talk to your doctor, pharmacist, or health care provider. Copyright 2015 [employer] Standard

## 2016-08-13 NOTE — Consults (Signed)
[facility]    Date Time: 08/13/16 8:40 AM  Patient Name: [facility]  Requesting Physician: Carney Bern, MD       Reason for Consultation:   CHF      History:   Marco Bruce is a 83 y.o. male admitted on 08/12/2016.  We have been asked by Carney Bern, MD,  to provide cardiac consultation, regarding CHF.   History obtained from grandson at bedside who provides translation.  Patient called EMS after sudden onset of diffuse joint pain primarily in lower extremities yesterday.  Intensity moderate to severe to the point that it limited patient's movement.  Patient denied chest pain, shortness of breath, or edema.  Has been urinating normally but did have decreased appetite.    Personally reviewed:  ECG - V paced  Labs - BNP 7700, Trop 1.4 then 1.2, BUN 100, Cr 4.1  CXR - no pulmonary edema.  Prominent pulmonary vasculature.      Past Medical History:     Past Medical History:   Diagnosis Date   . Congestive heart failure (CHF)    . Diabetes mellitus    . Gout     chronic   . Hypertension        Past Surgical History:     Past Surgical History:   Procedure Laterality Date   . ABDOMINAL SURGERY         Family History:   History reviewed. No pertinent family history.    Social History:     Social History     Social History   . Marital status: Married     Spouse name: N/A   . Number of children: N/A   . Years of education: N/A     Social History Main Topics   . Smoking status: Never Smoker   . Smokeless tobacco: Never Used   . Alcohol use No      Comment: former alcoholic   . Drug use: No   . Sexual activity: Not on file     Other Topics Concern   . Not on file     Social History Narrative   . No narrative on file       Allergies:   No Known Allergies    Medications:     Prescriptions Prior to Admission   Medication Sig   . atorvastatin (LIPITOR) 40 MG tablet Take 1 tablet (40 mg total) by mouth every evening.   . docusate sodium (COLACE) 100 MG capsule Take 100 mg by  mouth 2 (two) times daily.   Marland Kitchen gabapentin (NEURONTIN) 100 MG capsule TAKE ONE CAPSULE BY MOUTH EVERY 8 HOURS   . insulin glargine 100 UNIT/ML injection pen Inject 8 Units into the skin nightly.   . Insulin Pen Needle 31G X 5 MM Misc INJECT INSULIN DAILY AS DIRECTED   . pantoprazole (PROTONIX) 40 MG tablet TAKE ONE TABLET BY MOUTH EVERY DAY   . chlorthalidone (HYGROTEN) 50 MG tablet TAKE ONE TABLET BY MOUTH EVERY DAY IN THE MORNING WITH FOOD   . cyclobenzaprine (FLEXERIL) 10 MG tablet Take 1 tablet (10 mg total) by mouth every 8 (eight) hours as needed for Muscle spasms.   Marland Kitchen glucose blood (TRUE METRIX BLOOD GLUCOSE TEST) test strip 1 each by Other route 2 (two) times daily. Use as instructed   . lidocaine (LIDODERM) 5 % Place 1 patch onto the skin daily.Remove & Discard patch within 12 hours or as directed by MD   .  Linagliptin 5 MG Tab Take 5 mg by mouth.   Marland Kitchen NIFEdipine (PROCARDIA XL) 60 MG 24 hr tablet TAKE 2 TABLETS BY MOUTH DAILY       Current Facility-Administered Medications   Medication Dose Route Frequency Provider Last Rate Last Dose   . acetaminophen (TYLENOL) tablet 650 mg  650 mg Oral 4X Daily PRN Hartojo, Wibisono, MD       . aspirin EC EC tablet 325 mg  325 mg Oral Daily Hartojo, Wibisono, MD   325 mg at 08/13/16 0626   . atorvastatin (LIPITOR) tablet 40 mg  40 mg Oral QPM Hartojo, Wibisono, MD       . cyclobenzaprine (FLEXERIL) tablet 10 mg  10 mg Oral Q8H PRN Hartojo, Wibisono, MD   10 mg at 08/13/16 0126   . dextrose (GLUCOSE) 40 % oral gel 15 g of glucose  15 g of glucose Oral PRN Hartojo, Wibisono, MD        And   . dextrose 50 % bolus 12.5 g  12.5 g Intravenous PRN Hartojo, Wibisono, MD        And   . glucagon (rDNA) (GLUCAGEN) injection 1 mg  1 mg Intramuscular PRN Hartojo, Wibisono, MD       . docusate sodium (COLACE) capsule 100 mg  100 mg Oral BID Hartojo, Wibisono, MD       . enoxaparin (LOVENOX) syringe 70 mg  1 mg/kg Subcutaneous Q24H Hartojo, Wibisono, MD   70 mg at 08/13/16 0626   .  furosemide (LASIX) injection 40 mg  40 mg Intravenous Q12H Hartojo, Wibisono, MD   40 mg at 08/13/16 0755   . gabapentin (NEURONTIN) capsule 100 mg  100 mg Oral TID Hartojo, Wibisono, MD       . insulin lispro (HumaLOG) injection 1-3 Units  1-3 Units Subcutaneous QHS PRN Hartojo, Wibisono, MD       . insulin lispro (HumaLOG) injection 1-5 Units  1-5 Units Subcutaneous TID AC PRN Hartojo, Wibisono, MD       . NIFEdipine XL (PROCARDIA XL) 24 hr tablet 30 mg  30 mg Oral Daily Hartojo, Wibisono, MD       . ondansetron (ZOFRAN) injection 4 mg  4 mg Intravenous Q6H PRN Hartojo, Wibisono, MD       . oxyCODONE (ROXICODONE) 5 MG/5ML solution 5 mg  5 mg Oral Q6H PRN Elisha Headland G, PA   5 mg at 08/12/16 2024   . pantoprazole (PROTONIX) EC tablet 40 mg  40 mg Oral QAM AC Hartojo, Wibisono, MD   40 mg at 08/13/16 0755         Review of Systems:    Comprehensive review of systems including constitutional, eyes, ears, nose, mouth, throat, cardiovascular, GI, GU, musculoskeletal, integumentary, respiratory, neurologic, psychiatric, and endocrine is negative other than what is mentioned already in the history of present illness    Physical Exam:     Vitals:    08/13/16 0601   BP: 168/70   Pulse: 65   Resp: 18   Temp: 97 F (36.1 C)   SpO2: 95%     Temp (24hrs), Avg:97.6 F (36.4 C), Min:97 F (36.1 C), Max:98.2 F (36.8 C)      Intake and Output Summary (Last 24 hours) at Date Time    Intake/Output Summary (Last 24 hours) at 08/13/16 0840  Last data filed at 08/13/16 0601   Gross per 24 hour   Intake  1615 ml   Output                0 ml   Net             1615 ml       GENERAL: Patient is in no acute distress   HEENT: No scleral icterus or conjunctival pallor, moist mucous membranes   NECK: No jugular venous distention or thyromegaly, normal carotid upstrokes   CARDIAC: Normal apical impulse, regular rate and rhythm, 2/6 systolic murmur LSB  CHEST: Clear to auscultation bilaterally, normal respiratory  effort  ABDOMEN: Soft, nontender  EXTREMITIES: No edema  SKIN: No rash or jaundice   NEUROLOGIC: Alert and oriented, normal mood and affect    MUSCULOSKELETAL: Does not move limited by pain    Labs Reviewed:       Recent Labs  Lab 08/13/16  0646 08/13/16  0115 08/12/16  1842   Creatine Kinase (CK) 66 61 63   Troponin I 1.24* 1.45*  --                Recent Labs  Lab 08/13/16  0115 08/12/16  1842   Bilirubin, Total 0.5 0.5   Bilirubin, Direct  --  0.2   Protein, Total 6.2 6.1   Albumin 3.3* 3.4*   ALT 17 19   AST (SGOT) 30 28       Recent Labs  Lab 08/13/16  0115   Magnesium 2.7*       Recent Labs  Lab 08/13/16  0115   PT 13.3   PT INR 1.0   PTT 26       Recent Labs  Lab 08/13/16  0115 08/12/16  1842 08/08/16  0133   WBC 11.90* 11.23* 13.19*   Hgb 9.4* 9.8* 11.2*   Hematocrit 26.1* 27.2* 33.5*   Platelets 251 262 251       Recent Labs  Lab 08/13/16  0115 08/12/16  1842 08/08/16  0133   Sodium 133*  --  142   Potassium 4.5  --  4.3   Chloride 102  --  105   CO2 20*  --  25   BUN 100.7*  --  33.0*   Creatinine 4.1*  --  2.0*   i-STAT Creatinine  --  5.30*  --    EGFR 13.8  --  31.7   Glucose 246*  --  92   Calcium 8.7  --  9.9         Radiology   Radiological Procedure reviewed.      chest X-ray  Assessment:      Diastolic HF.  Chronic with mild acute worsening.  NYHA Class II.  No over volume overload, but BNP 7700 on presentation and pulmonary vascular congestion on CXR. LVEF 75% on echo 05/27/15   Severe valve disease.  Last echo 05/27/15  ? Severe MR  ? Severe TR  ? Mild AS, mild to moderate AR    Type II MI.  Flat curve troponin with no anginal symptoms   Pacemaker 02/2016   Acute renal failure with elevated BUN. Hx of CKDz.   HTN   DM.   Anemia.   HLD.   Former Tobacco use. ? COPD.    Recommendations:    Despite lab results, patient does not really complain of cardiac symptoms.  Primary concern is renal.  Consult nephrologist.   Continue aspirin, atorvastatin, nifedipine.   From cardiac standpoint, can  discontinue therapeutic lovenox.  Will consider ischemia testing with lexiscan after noncardiac medical issues more stable.   Ok to keep I/O even.  Does not appear to overtly volume overloaded.  From cardiac standpoint, can discontinue IV BID lasix.   Will follow along, but at this time, no plans for further cardiac testing until medical issues more stable.      Signed by: Emeline General, MD      [facility]  NP [place] (8am-5pm)  MD [facility] [phone] (8am-5pm)  After hours, non urgent consult line [phone]  After Hours, urgent consults [phone]

## 2016-08-13 NOTE — Progress Notes (Signed)
Home Health Referral          Referral from [place] (Case Manager) for home health care upon discharge.    By [employer], the patient has the right to freely choose a home care provider.  Arrangements have been made with:     A company of the patients choosing. We have supplied the patient with a listing of providers in your area who asked to be included and participate in Medicare.   [facility], a home care agency that provides both adult home care services which is a wholly owned and operated by [employer] and participates in [employer]   The preferred provider of your insurance company. Choosing a home care provider other than your insurance company's preferred provider may affect your insurance coverage.    The Home Health Care Referral Form acknowledging the voluntary selection of the home care company has been completed, signed, and is on file.      Home Health Discharge Information     Your doctor has ordered Skilled Nursing, Physical Therapy, Occupational Therapy and Other Wheelchair in-home service(s) for you while you recuperate at home, to assist you in the transition from hospital to home.      The agency that you or your representative chose to provide the service:  Name of [place]: [facility] ([place]:  Name of DME Agency: Other (comment) (To be determined)]  Equipment Ordered:  Wheelchair      The above services were set up by:  Johney Maine   RN, BSN(Home Health Liaison)   Phone       [phone]                                    Additional comments: If you have not heard from your home health agency within 24-48 hours after discharge please call your agency to arrange a time for your first visit.  For any scheduling concerns or questions related to home health, such as time or date please contact your home health agency at the number listed above.       Signed by: Johney Maine  Date Time:  08/13/16 3:59 PM

## 2016-08-13 NOTE — PT Eval Note (Signed)
[facility]  [place], [place]    Department of Rehabilitation  [phone]    Physical Therapy Evaluation    Patient: Marco Bruce    MRN#: [id number]     [id number]/[id number]    Time of treatment: Time Calculation  PT Received On: 08/13/16  Start Time: 1210  Stop Time: 1238  Time Calculation (min): 28 min    PT Visit Number: 1    Consult received for Marco Bruce for PT Evaluation and Treatment.  Patient's medical condition is appropriate for Physical therapy intervention at this time.      Assessment:   Marco Bruce is a 83 y.o. male admitted 08/12/2016.  Pt's functional mobility is impacted by:  decreased balance, decreased bed mobility, gait impairment, decreased strength and transfers .  There are many comorbidities or other factors that affect plan of care and require modification of task including: assistive device needed for mobility and chronic pain.  Standardized tests and exams incorporated into evaluation include AMPAC mobility.  Pt demonstrates a stable clinical presentation.   Pt would continue to benefit from PT to address these deficits and increase functional independence.     Complexity Level Hx and Co  morbidites Examination Clinical Decision Making Clinical Presentation   Low no impact 1-2 elements Limited options Stable   Moderate   1-2 factors 3 or more   Several options Evolving, plan may alter   High 3 or more 4 or more Multiple options Unstable, unpredictable       Impairments: Assessment: Decreased LE strength;Gait impairment;Decreased balance;Decreased functional mobility.     Therapy Diagnosis: generalized weakness, decreased functional mobility , decreased independence with ADL's and increased gait dysfunction due to recent H and P. Without therapy interventions, patient is at risk for falls, dependence on caregivers for mobility, dependence on caregivers for ADL's, decreased independence, failure to return to PLOF and decreased quality of life.    Rehabilitation  Potential: Prognosis: Good;With continued PT status post acute discharge      Plan:    Treatment/Interventions: Exercise;Gait training;Stair training;Neuromuscular re-education;Functional transfer training;Continued evaluation PT Frequency: 3-4x/wk    Risks/Benefits/POC Discussed with Pt/Family: With patient/family          Goals:   Goals  Goal Formulation: With patient/family  Time for Goal Acheivement: By time of discharge  Goals: Select goal  Pt Will Go Supine To Sit: with moderate assist;to maximize functional mobility and independence;7 visits  Pt Will Stand: with moderate assist;to maximize functional mobility and independence;7 visits  Pt Will Ambulate: with rolling walker;11-30 feet;with minimal assist;7 visits  Pt Will Go Up / Down Stairs: 1 flight;with moderate assist;With rail;to maximize functional mobility and independence;10 visits      Discharge Recommendations:   Based on today's session patient's discharge recommendation is the following: Discharge Recommendation: SNF     If Discharge Recommendation: SNF is not available, then the patient will need home health services, 24/7 supervision, assistance with mobility, assistance with ADL's and WC, hoyer lift.  DME Recommended for Discharge:  (TBD at next level of care)      Precautions and Contraindications:   Precautions  Weight Bearing Status: no restrictions  Other Precautions: falls    Medical Diagnosis: Polyarthralgia [M25.50]  Intractable pain [R52]  Acute renal failure, unspecified acute renal failure type [N17.9]    History of Present Illness: Marco Bruce is a 83 y.o. male admitted on 08/12/2016 with chief complaint of  diffuse joint pain   As per H  and P:  "came with the chief complaint of diffuse joint pain since 1 week ago. Pt is a poor historian due to language barrier, history obtained from grandson at bedside who was also translating. Diffuse joint pain is chronic, involves his shoulder, elbow, knee and ankle bilaterally, occurring over years,  however, started worsening this past week. Pt was seen in the ER on 08/08/16 and given percocet. However, Pt's diffuse joint pain continued to worsen this past 2 days, and Pt was also noticed to be SOB this past 2 days, increased w/ exertion and accompanied w/ coughing w/ productive mucous. Pt denies orthopnea, PND, wheezing or leg swelling. Pt has had decreased oral intake over the past week due to diffuse pain. In the ER pt was found to have a Cr of 5.3, increased from his baseline of 2.0"    Patient Active Problem List   Diagnosis   . SOB (shortness of breath)   . ARF (acute renal failure)   . Pulmonary edema   . Acute pulmonary edema   . Acute renal failure, unspecified acute renal failure type   . CKD (chronic kidney disease), unspecified stage   . Acute diastolic heart failure   . Hospital discharge follow-up   . Peripheral edema   . Pneumonia due to infectious organism, unspecified laterality, unspecified part of lung   . Chronic diastolic heart failure   . CKD (chronic kidney disease) stage 4, GFR 15-29 ml/min   . Chronic pain syndrome   . Arthritis   . Uncontrolled type 2 diabetes with renal manifestation   . Renovascular hypertension   . NSTEMI (non-ST elevated myocardial infarction)   . CHF (congestive heart failure)   . Acute renal failure superimposed on stage 3 chronic kidney disease   . Hyponatremia   . Normocytic anemia   . HTN (hypertension)   . Diabetes mellitus, type II   . OA (osteoarthritis)   . Acute on chronic diastolic congestive heart failure        Past Medical/Surgical History:  Past Medical History:   Diagnosis Date   . Congestive heart failure (CHF)    . Diabetes mellitus    . Gout     chronic   . Hypertension       Past Surgical History:   Procedure Laterality Date   . ABDOMINAL SURGERY           X-Rays/Tests/Labs:  Xr Chest Ap Portable    Result Date: 08/13/2016   1.  Low lung volumes. Pulmonary vascular congestion without overt pulmonary edema. 2.  Stable cardiomegaly. Park Pope, MD  08/13/2016 5:52 AM      Social History:  Prior Level of Function  Prior level of function: Independent with ADLs, Ambulates with assistive device  Assistive Device: Single point cane  Baseline Activity Level: Household ambulation  Driving: does not drive  Cooking: No  DME Currently at Home: Single point cane  [place]: Family members  Type of Home: House  Home Layout: Stairs to enter with rails (add number in comment), 1/2 bath on main level, Able to live on main level with bedroom/bathroom (12)  Bathroom Shower/Tub: Tourist information centre manager: Soil scientist: Civil engineer, contracting, Grab bars in shower  DME Currently at Home: Single point cane      Subjective:    Patient is agreeable to participation in the therapy session. Nursing clears patient for therapy.   Patient Goal  Patient Goal: "none stated"  Pain Assessment  Pain Assessment: PAINAD    Objective:   Observation of Patient/Vital Signs:  Patient is in bed with peripheral IV in place.    Cognition/Neuro Status  Arousal/Alertness: Delayed responses to stimuli  Attention Span: Difficulty attending to directions  Orientation Level: Disoriented X4  Memory: Decreased long term memory;Decreased short term memory  Following Commands: Does not follow commands      Gross ROM  Right Lower Extremity ROM: unable to assess  Left Lower Extremity ROM: unable to assess  Gross Strength  Right Lower Extremity Strength: unable to assess  Left Lower Extremity Strength: unable to assess       Functional Mobility  Supine to Sit: Dependent (x2), assist to position truck and LE  Scooting to EOB: Dependent  Sit to Supine: Dependent (x2), unable to maintain upright sitting reqd max A to maintain sitting  Sit to Stand:  (Attempted with max Ax2, pt unable to stand)     PMP Activity: Step 4 - Dangle at Bedside        Participation and Endurance  Participation Effort: fair    AM-PACT Inpatient Short Forms  Inpatient AM-PACT Performed? (PT):  Basic Mobility Inpatient Short Form  AM-PACT "6 Clicks" Basic Mobility Inpatient Short Form  Turning Over in Bed: A lot  Sitting Down On/Standing From Armchair: Unable  Lying on Back to Sitting on Side of Bed: A lot  Assist Moving to/from Bed to Chair: Total  Assist to Walk in Hospital Room: Total  Assist to Climb 3-5 Steps with Railing: Total  PT Basic Mobility Raw Score: 8  CMS 0-100% Score: 86.62%    Treatment Activities: PT evaluation,  Pt and family was educated on Increasing activity and getting assist for OOB  Mobility. Family member Encouraged to perform LE therex throughout the day to decrease effects of immobility. Encouraged to ambulate with RN staff to maintain and improve functional mobility. Pt educated on safety awareness and fall prevention strategies.   Despite having family translate, pt had difficulty following directions. Pt  sat up to EOB dependent, however severe pushing back initially. Performed seated balance activities,positoning facilitated upright sitting, elbow propping to each side  Pt unable to sit unsupported. Continue PT    Myrla Halsted, PT

## 2016-08-13 NOTE — Progress Notes (Signed)
Patient seen and examined.  H and P reviewed     Chronic with mild acute worsening of diastolic heart failure.  No problem.  Was given IV Lasix, but no longer indicated, especially with worsening renal function.     Acute renal failure on chronic kidney disease: Appreciate nephrology's input.  Gentle IV fluids.  Recheck levels tomorrow.  Hold Lasix   Diffuse joint pain/osteoarthritis: According to outpatient records, patient has been having bilateral knee pain, ankle, wrist, elbow pain off and on for over a year.  Continue supportive care.  Continue oxycodone and Flexeril as needed   Disposition    Carney Bern,  M.D.

## 2016-08-13 NOTE — UM Notes (Signed)
[facility] Utilization Review   NPI [id number], Tax ID [id number]  Please call Billey Chang @ [phone] with any questions or concerns.  Fax final authorization and requests for additional information to [phone]    IP admission 5/10    83 year old male came with the chief complaint of diffuse joint pain since 1 week ago.  Diffuse joint pain is chronic, involves his shoulder, elbow, knee and ankle bilaterally, occurring over years, however, started worsening this past week. Pt was seen in the ER on 08/08/16 and given percocet. However, Pt's diffuse joint pain continued to worsen this past 2 days, and Pt was also noticed to be SOB this past 2 days, increased w/ exertion and accompanied w/ coughing w/ productive mucous. Pt denies orthopnea, PND, wheezing or leg swelling. Pt has had decreased oral intake over the past week due to diffuse pain.    Past Medical History:   Diagnosis Date   . Congestive heart failure (CHF)    . Diabetes mellitus    . Gout     chronic   . Hypertension      Past Surgical History:   Procedure Laterality Date   . ABDOMINAL SURGERY       V/S;  T 98.2, p 59, 02 94%, R 18, BP 149/60, pain 0/10    Abn lab;s  WBc 11.23, hgb 9.8, hct 27.2, RBC 3.14, sed rate 105, C-reactive 9.3, Alb 3.4    Urine:  Protein 30    ED meds;  Oxycodone 5 mg po, NS 1000 cc bolus, Insulin 5 units sq    IN ER:   In the ER pt was found to have a Cr of 5.3, increased from his baseline of 2.0. Pt's last Echo on 05/27/15 shows an EF of 75%, severe MR and TR, severe pulmonary HTN.    Patient transferred to Med/Surg Unit as IP    Assessment and Plan:   1.  Acute on chronic diastolic CHF: started lasix IV, strict I/O, monitor on tele, follow CE, obtain Echo, consult cardiology ([place] [facility])    2.  NSTEMI: other differential includes demand ischemia from CHF, will start ASA, lovenox 1 mg/kg for now, follow CE, monitor on tele, consult cardiology ( heart)    3.  Acute on CKD stage III: possibly prerenal, continue  diuresis w/ lasix IV, obtain renal US, repeat labs in AM    4.  Hyponatremia: continue diuresis, repeat labs in AM    5.  DM type II: continue ISS w/ humalog    6.  HTN: continue nifedipine    7.  Osteoarthritis: pain control prn    8.  Normocytic anemia: most likely due to anemia of chronic disease, Hb stable, continue to monitor    5/11 continued care of patient    V/S;  T 97.5, p 91, 02 95%, R 16, B P153/73, pain 0/10    Abn labs:  WBC 11.90, hgb 9.4, hct 26.1, RBC 3.02, Glucose 246, BUN 100.7, Cr 4.1, Na 133, Co2 20, Alb 3.3, Troponin 1 1.24, BNP 7,758.8      Cardiology NOTES:  Reason for Consultation:   CHF    Assessment:      Diastolic HF.  Chronic with mild acute worsening.  NYHA Class II.  No over volume overload, but BNP 7700 on presentation and pulmonary vascular congestion on CXR. LVEF 75% on echo 05/27/15   Severe valve disease.  Last echo 05/27/15  ? Severe MR  ? Severe TR  ?  Mild AS, mild to moderate AR    Type II MI.  Flat curve troponin with no anginal symptoms   Pacemaker 02/2016   Acute renal failure with elevated BUN. Hx of CKDz.   HTN   DM.   Anemia.   HLD.   Former Tobacco use. ? COPD.    Recommendations:    Primary concern is renal.  Consult nephrologist.     Continue aspirin, atorvastatin, nifedipine.     From cardiac standpoint, can discontinue therapeutic lovenox.  Will consider ischemia testing with lexiscan after noncardiac medical issues more stable.     Keep I/O even.  Does not appear to overtly volume overloaded.  From cardiac standpoint, can discontinue IV BID lasix.     Will follow along, but at this time, no plans for further cardiac testing until medical issues more stable.

## 2016-08-13 NOTE — H&P (Signed)
[facility]PITAL[facility]  H&P  Patient Info:   Date/Time: 08/13/2016 / 12:41 AM   Admit Date:08/12/2016  Patient Name:Marco Bruce   JAS:[id number]   PCP: Candelaria Celeste, MD  Attending Physician: Drinda Butts, MD     Assessment and Plan:   1.  Acute on chronic diastolic CHF: started lasix IV, strict I/O, monitor on tele, follow CE, obtain Echo, consult cardiology ([facility])  2.  NSTEMI: other differential includes demand ischemia from CHF, will start ASA, lovenox 1 mg/kg for now, follow CE, monitor on tele, consult cardiology ([facility])  3.  Acute on CKD stage III: possibly prerenal, continue diuresis w/ lasix IV, obtain renal US, repeat labs in AM  4.  Hyponatremia: continue diuresis, repeat labs in AM  5.  DM type II: continue ISS w/ humalog  6.  HTN: continue nifedipine  7.  Osteoarthritis: pain control prn  8.  Normocytic anemia: most likely due to anemia of chronic disease, Hb stable, continue to monitor      DVT Prohylaxis:lovenox and SEDs   Code Status: Prior  Disposition:home  Type of Admission:Inpatient  Estimated Length of Stay (including stay in the ER receiving treatment): More than 2 days  Milestones required for discharge: Acute on chronic diastolic CHF, NSTEMI, acute on CKD stage III resolved     Hospital Problems:     Patient Active Problem List   Diagnosis   . SOB (shortness of breath)   . ARF (acute renal failure)   . Pulmonary edema   . Acute pulmonary edema   . Acute renal failure, unspecified acute renal failure type   . CKD (chronic kidney disease), unspecified stage   . Acute diastolic heart failure   . Hospital discharge follow-up   . Peripheral edema   . Pneumonia due to infectious organism, unspecified laterality, unspecified part of lung   . Chronic diastolic heart failure   . CKD (chronic kidney disease) stage 4, GFR 15-29 ml/min   . Chronic pain syndrome   . Arthritis   . Uncontrolled type 2 diabetes with renal manifestation   . Renovascular hypertension   . NSTEMI (non-ST  elevated myocardial infarction)   . CHF (congestive heart failure)   . Acute renal failure superimposed on stage 3 chronic kidney disease   . Hyponatremia   . Normocytic anemia   . HTN (hypertension)   . Diabetes mellitus, type II   . OA (osteoarthritis)   . Acute on chronic diastolic congestive heart failure       Clinical Presentation:   History of Presenting Illness:   Marco Bruce is a 83 y.o. male who has history of   Past Surgical History:   Procedure Laterality Date   . ABDOMINAL SURGERY      Past Medical History:   Diagnosis Date   . Congestive heart failure (CHF)    . Diabetes mellitus    . Hypertension     came with the chief complaint of diffuse joint pain since 1 week ago. Pt is a poor historian due to language barrier, history obtained from grandson at bedside who was also translating. Diffuse joint pain is chronic, involves his shoulder, elbow, knee and ankle bilaterally, occurring over years, however, started worsening this past week. Pt was seen in the ER on 08/08/16 and given percocet. However, Pt's diffuse joint pain continued to worsen this past 2 days, and Pt was also noticed to be SOB this past 2 days, increased w/ exertion and accompanied w/ coughing  w/ productive mucous. Pt denies orthopnea, PND, wheezing or leg swelling. Pt has had decreased oral intake over the past week due to diffuse pain. In the ER pt was found to have a Cr of 5.3, increased from his baseline of 2.0. Pt's last Echo on 05/27/15 shows an EF of 75%, severe MR and TR, severe pulmonary HTN. Pt denies taking any NSAIDs, denies any recent medication changes, and denies any recent hospitalization. Pt denies any headache, lightheadedness, fever, chills, chest pain, palpitation, cough, N/V, abdominal pain, diarrhea, urinary symptoms, numbness or focal weakness.     Review of Systems:   Review of Systems   Constitutional: Positive for malaise/fatigue. Negative for chills, diaphoresis, fever and weight loss.   HENT: Negative for congestion,  ear pain, hearing loss, nosebleeds, sore throat and tinnitus.    Eyes: Negative for blurred vision, double vision, photophobia and pain.   Respiratory: Positive for cough, sputum production and shortness of breath. Negative for hemoptysis, wheezing and stridor.    Cardiovascular: Negative for chest pain, palpitations, orthopnea, claudication, leg swelling and PND.   Gastrointestinal: Negative for abdominal pain, blood in stool, diarrhea, heartburn, melena, nausea and vomiting.   Genitourinary: Negative for dysuria, flank pain, frequency, hematuria and urgency.   Musculoskeletal: Positive for joint pain. Negative for back pain, falls, myalgias and neck pain.   Skin: Negative for itching and rash.   Neurological: Negative for dizziness, tingling, tremors, sensory change, speech change, focal weakness, seizures, loss of consciousness and headaches.   Endo/Heme/Allergies: Negative for polydipsia. Does not bruise/bleed easily.   Psychiatric/Behavioral: Negative for depression, hallucinations, substance abuse and suicidal ideas.     Physical Exam:     Vitals:    08/12/16 1808 08/12/16 2000 08/12/16 2100 08/12/16 2300   BP: 149/60 140/62 135/60 133/58   Pulse: (!) 59 62 67 64   Resp: 18  16    Temp: 98.2 F (36.8 C)      TempSrc: Oral      SpO2: 95% 94% 99% 99%     Physical Exam   Constitutional: He is oriented to person, place, and time and well-developed, well-nourished, and in no distress. No distress.   HENT:   Head: Normocephalic and atraumatic.   Mouth/Throat: Oropharynx is clear and moist. No oropharyngeal exudate.   Eyes: Conjunctivae and EOM are normal. Pupils are equal, round, and reactive to light. No scleral icterus.   Neck: Normal range of motion. Neck supple. No JVD present. No tracheal deviation present. No thyromegaly present.   Cardiovascular: Normal rate, regular rhythm, normal heart sounds and intact distal pulses.  Exam reveals no gallop and no friction rub.    No murmur heard.  Pulmonary/Chest: Effort  normal and breath sounds normal. No stridor. No respiratory distress. He has no wheezes. He has no rales. He exhibits no tenderness.   Abdominal: Soft. Bowel sounds are normal. He exhibits no distension and no mass. There is no tenderness. There is no rebound and no guarding.   Musculoskeletal: Normal range of motion. He exhibits no edema or tenderness.   Lymphadenopathy:     He has no cervical adenopathy.   Neurological: He is alert and oriented to person, place, and time. He has normal reflexes. He displays normal reflexes. No cranial nerve deficit. He exhibits normal muscle tone. GCS score is 15.   Skin: Skin is warm. No rash noted. He is not diaphoretic. No erythema. No pallor.   Psychiatric: Mood, affect and judgment normal.   Vitals reviewed.  Clinical Information and History:   Chief Complaint:  Chief Complaint   Patient presents with   . Leg Pain   . Shoulder Pain     Past Medical History:  Past Medical History:   Diagnosis Date   . Congestive heart failure (CHF)    . Diabetes mellitus    . Hypertension      Past Surgical History:  Past Surgical History:   Procedure Laterality Date   . ABDOMINAL SURGERY       Family History:History reviewed. No pertinent family history.  Social History:  History   Alcohol Use No     History   Drug Use No     History   Smoking Status   . Former Smoker   Smokeless Tobacco   . Never Used     Social History     Social History   . Marital status: Married     Spouse name: N/A   . Number of children: N/A   . Years of education: N/A     Social History Main Topics   . Smoking status: Former Research scientist (life sciences)   . Smokeless tobacco: Never Used   . Alcohol use No   . Drug use: No   . Sexual activity: Not Asked     Other Topics Concern   . None     Social History Narrative   . None     Allergies:No Known Allergies  Medications:  Prescriptions Prior to Admission   Medication Sig Dispense Refill Last Dose   . atorvastatin (LIPITOR) 40 MG tablet Take 1 tablet (40 mg total) by mouth every evening. 30  tablet 3 Taking   . chlorthalidone (HYGROTEN) 50 MG tablet TAKE ONE TABLET BY MOUTH EVERY DAY IN THE MORNING WITH FOOD  5    . cyclobenzaprine (FLEXERIL) 10 MG tablet Take 1 tablet (10 mg total) by mouth every 8 (eight) hours as needed for Muscle spasms. 30 tablet 3 Taking   . docusate sodium (COLACE) 100 MG capsule Take 100 mg by mouth 2 (two) times daily.   Taking   . gabapentin (NEURONTIN) 100 MG capsule TAKE ONE CAPSULE BY MOUTH EVERY 8 HOURS  5    . glucose blood (TRUE METRIX BLOOD GLUCOSE TEST) test strip 1 each by Other route 2 (two) times daily. Use as instructed 200 each PRN Taking   . insulin glargine 100 UNIT/ML injection pen Inject 8 Units into the skin nightly. 5 pen 12 Taking   . Insulin Pen Needle 31G X 5 MM Misc INJECT INSULIN DAILY AS DIRECTED 100 each 12 Taking   . lidocaine (LIDODERM) 5 % Place 1 patch onto the skin daily.Remove & Discard patch within 12 hours or as directed by MD 15 patch 0    . Linagliptin 5 MG Tab Take 5 mg by mouth.      Marland Kitchen NIFEdipine (PROCARDIA XL) 60 MG 24 hr tablet TAKE 2 TABLETS BY MOUTH DAILY  4    . pantoprazole (PROTONIX) 40 MG tablet TAKE ONE TABLET BY MOUTH EVERY DAY  3      Results of Labs/imaging   Labs have been reviewed:   Coagulation Profile:       CBC review:   Recent Labs  Lab 08/12/16  1842 08/08/16  0133   WBC 11.23* 13.19*   Hgb 9.8* 11.2*   Hematocrit 27.2* 33.5*   Platelets 262 251   MCV 86.6 92.3   RDW 14 15   Neutrophils 70.8 58.1  Lymphocytes Automated 12.9 17.0   Eosinophils Automated 7.7 16.6   Immature Granulocyte 0.9 0.3   Neutrophils Absolute 7.96 7.67   Absolute Immature Granulocyte 0.10* 0.04     Chem Review:  Recent Labs  Lab 08/12/16  1842 08/08/16  0133   Sodium  --  142   Potassium  --  4.3   Chloride  --  105   CO2  --  25   BUN  --  33.0*   Creatinine  --  2.0*   i-STAT Creatinine 5.30*  --    Glucose  --  92   Calcium  --  9.9   Bilirubin, Total 0.5 0.4   AST (SGOT) 28 24   ALT 19 10   Alkaline Phosphatase 75 87     Results     Procedure  Component Value Units Date/Time    UA with reflex to micro (pts  3 + yrs) [[id number]]  (Abnormal) Collected:  08/12/16 2226    Specimen:  Urine from Urine, Clean Catch Updated:  08/12/16 2243     Urine Type Clean Catch     Color, UA YELLOW     Clarity, UA CLEAR     Specific Gravity UA 1.017     Urine pH 5.5     Leukocyte Esterase, UA NEGATIVE     Nitrite, UA NEGATIVE     Protein, UR 30 (A)     Glucose, UA NEGATIVE     Ketones UA NEGATIVE     Urobilinogen, UA 0.2 mg/dL      Bilirubin, UA NEGATIVE     Blood, UA NEGATIVE    Microscopic, Urine [[id number]]  (Abnormal) Collected:  08/12/16 2226     Updated:  08/12/16 2243     RBC, UA 0 -2 /hpf      WBC, UA 0 - 5 /hpf      Squamous Epithelial Cells, Urine 0 - 5 /hpf      Granular Casts, UA 0 - 2 (A) /lpf      Hyaline Casts, UA 6 - 10 (A) /lpf     Lyme Ab Total Rflx to Western Blot (IgM) [[id number]] Collected:  08/12/16 1842    Specimen:  Blood Updated:  08/12/16 2119    C-reactive Protein (CRP) [[id number]]  (Abnormal) Collected:  08/12/16 1842    Specimen:  Blood Updated:  08/12/16 2114     C-Reactive Protein 9.3 (H) mg/dL     Hepatic function panel (LFT) [[id number]]  (Abnormal) Collected:  08/12/16 1842     Updated:  08/12/16 2114     Bilirubin, Total 0.5 mg/dL      Bilirubin, Direct 0.2 mg/dL      Bilirubin, Indirect 0.3 mg/dL      AST (SGOT) 28 U/L      ALT 19 U/L      Alkaline Phosphatase 75 U/L      Protein, Total 6.1 g/dL      Albumin 3.4 (L) g/dL      Globulin 2.7 g/dL      Albumin/Globulin Ratio 1.3    Creatine Kinase (CK) [[id number]] Collected:  08/12/16 1842     Updated:  08/12/16 2114     Creatine Kinase (CK) 63 U/L     Lactic acid x2, sepsis [[id number]] Collected:  08/12/16 1935    Specimen:  Blood Updated:  08/12/16 2013     Lactic acid 1.5 mmol/L     Narrative:       Cancel if the initial  lactate level is < 2.0 mmol/L.    Sedimentation rate (ESR) [[id number]]  (Abnormal) Collected:  08/12/16 1842    Specimen:  Blood Updated:  08/12/16 1901     Sed Rate 105 (H)  mm/Hr     i-Stat Chem 8 CartrIDge [[id number]]  (Abnormal) Collected:  08/12/16 1842     Updated:  08/12/16 1900     i-STAT Glucose 305 (H) mg/dL      i-STAT BUN 103 (H) mg/dL      i-STAT Creatinine 5.30 (H) mg/dL      i-STAT Sodium 130 (L) mmol/L      i-STAT Potassium 4.2 mmol/L      i-STAT Chloride 94 (L) mmol/L      i-STAT CO2 22 mmol/L      i-STAT Calcium Ionized 2.3 mEq/L     CBC with differential [[id number]]  (Abnormal) Collected:  08/12/16 1842    Specimen:  Blood from Blood Updated:  08/12/16 1849     WBC 11.23 (H) x10 3/uL      Hgb 9.8 (L) g/dL      Hematocrit 27.2 (L) %      Platelets 262 x10 3/uL      RBC 3.14 (L) x10 6/uL      MCV 86.6 fL      MCH 31.2 pg      MCHC 36.0 g/dL      RDW 14 %      MPV 11.4 fL      Neutrophils 70.8 %      Lymphocytes Automated 12.9 %      Monocytes 7.5 %      Eosinophils Automated 7.7 %      Basophils Automated 0.2 %      Immature Granulocyte 0.9 %      Nucleated RBC 0.2 /100 WBC      Neutrophils Absolute 7.96 x10 3/uL      Abs Lymph Automated 1.45 x10 3/uL      Abs Mono Automated 0.84 x10 3/uL      Abs Eos Automated 0.86 (H) x10 3/uL      Absolute Baso Automated 0.02 x10 3/uL      Absolute Immature Granulocyte 0.10 (H) x10 3/uL      Absolute NRBC 0.02 (H) x10 3/uL         Radiology reports have been reviewed:  Radiology Results (24 Hour)     ** No results found for the last 24 hours. **        EKG: EKG reviewed   Last EKG Result     None           Hospitalist   Signed by:   Xandrea Clarey  08/13/2016 12:41 AM    *This note was generated by the Epic EMR system/ Dragon speech recognition and may contain inherent errors or omissions not intended by the user. Grammatical errors, random word insertions, deletions, pronoun errors and incomplete sentences are occasional consequences of this technology due to software limitations. Not all errors are caught or corrected. If there are questions or concerns about the content of this note or information contained within the body of this  dictation they should be addressed directly with the author for clarification

## 2016-08-14 ENCOUNTER — Inpatient Hospital Stay: Payer: Medicare Other

## 2016-08-14 ENCOUNTER — Other Ambulatory Visit (INDEPENDENT_AMBULATORY_CARE_PROVIDER_SITE_OTHER): Payer: Self-pay

## 2016-08-14 ENCOUNTER — Ambulatory Visit (INDEPENDENT_AMBULATORY_CARE_PROVIDER_SITE_OTHER): Payer: Self-pay

## 2016-08-14 DIAGNOSIS — R509 Fever, unspecified: Secondary | ICD-10-CM

## 2016-08-14 LAB — LACTIC ACID, PLASMA: Lactic Acid: 0.7 mmol/L (ref 0.2–2.0)

## 2016-08-14 LAB — CBC AND DIFFERENTIAL
Absolute NRBC: 0.03 10*3/uL — ABNORMAL HIGH
Basophils Absolute Automated: 0.02 10*3/uL (ref 0.00–0.20)
Basophils Automated: 0.1 %
Eosinophils Absolute Automated: 0.8 10*3/uL — ABNORMAL HIGH (ref 0.00–0.70)
Eosinophils Automated: 6 %
Hematocrit: 23.8 % — ABNORMAL LOW (ref 42.0–52.0)
Hgb: 8.3 g/dL — ABNORMAL LOW (ref 13.0–17.0)
Immature Granulocytes Absolute: 0.08 10*3/uL — ABNORMAL HIGH
Immature Granulocytes: 0.6 %
Lymphocytes Absolute Automated: 0.96 10*3/uL (ref 0.50–4.40)
Lymphocytes Automated: 7.2 %
MCH: 31.2 pg (ref 28.0–32.0)
MCHC: 34.9 g/dL (ref 32.0–36.0)
MCV: 89.5 fL (ref 80.0–100.0)
MPV: 11.3 fL (ref 9.4–12.3)
Monocytes Absolute Automated: 1.35 10*3/uL — ABNORMAL HIGH (ref 0.00–1.20)
Monocytes: 10.1 %
Neutrophils Absolute: 10.18 10*3/uL — ABNORMAL HIGH (ref 1.80–8.10)
Neutrophils: 76 %
Nucleated RBC: 0.2 /100 WBC (ref 0.0–1.0)
Platelets: 305 10*3/uL (ref 140–400)
RBC: 2.66 10*6/uL — ABNORMAL LOW (ref 4.70–6.00)
RDW: 15 % (ref 12–15)
WBC: 13.39 10*3/uL — ABNORMAL HIGH (ref 3.50–10.80)

## 2016-08-14 LAB — CALCIUM, IONIZED: Calcium, Ionized: 2.2 mEq/L (ref 2.20–2.60)

## 2016-08-14 LAB — IRON PROFILE
Iron Saturation: 6 % — ABNORMAL LOW (ref 15–50)
Iron: 11 ug/dL — ABNORMAL LOW (ref 40–160)
TIBC: 177 ug/dL — ABNORMAL LOW (ref 261–462)
UIBC: 166 ug/dL (ref 126–382)

## 2016-08-14 LAB — GLUCOSE WHOLE BLOOD - POCT
Whole Blood Glucose POCT: 190 mg/dL — ABNORMAL HIGH (ref 70–100)
Whole Blood Glucose POCT: 254 mg/dL — ABNORMAL HIGH (ref 70–100)
Whole Blood Glucose POCT: 329 mg/dL — ABNORMAL HIGH (ref 70–100)
Whole Blood Glucose POCT: 234 mg/dL — ABNORMAL HIGH (ref 70–100)

## 2016-08-14 LAB — BASIC METABOLIC PANEL
Anion Gap: 9 (ref 5.0–15.0)
BUN: 90.6 mg/dL — ABNORMAL HIGH (ref 9.0–28.0)
CO2: 22 mEq/L (ref 22–29)
Calcium: 8.2 mg/dL (ref 7.9–10.2)
Chloride: 109 mEq/L (ref 100–111)
Creatinine: 2.9 mg/dL — ABNORMAL HIGH (ref 0.7–1.3)
Glucose: 191 mg/dL — ABNORMAL HIGH (ref 70–100)
Potassium: 3.3 mEq/L — ABNORMAL LOW (ref 3.5–5.1)
Sodium: 140 mEq/L (ref 136–145)

## 2016-08-14 LAB — GFR: EGFR: 20.6

## 2016-08-14 LAB — MAGNESIUM: Magnesium: 2.3 mg/dL (ref 1.6–2.6)

## 2016-08-14 LAB — TSH: TSH: 0.51 u[IU]/mL (ref 0.35–4.94)

## 2016-08-14 LAB — PROCALCITONIN: Procalcitonin: 3.35 — ABNORMAL HIGH (ref 0.00–0.10)

## 2016-08-14 LAB — PHOSPHORUS: Phosphorus: 3.5 mg/dL (ref 2.3–4.7)

## 2016-08-14 LAB — HEMOLYSIS INDEX: Hemolysis Index: 2 (ref 0–18)

## 2016-08-14 MED ORDER — SENNOSIDES-DOCUSATE SODIUM 8.6-50 MG PO TABS
2.0000 | ORAL_TABLET | Freq: Two times a day (BID) | ORAL | Status: DC
Start: 2016-08-14 — End: 2016-08-17
  Administered 2016-08-14 – 2016-08-17 (×6): 2 via ORAL
  Filled 2016-08-14 (×6): qty 2

## 2016-08-14 MED ORDER — MORPHINE SULFATE 2 MG/ML IJ/IV SOLN (WRAP)
1.0000 mg | Status: DC | PRN
Start: 2016-08-14 — End: 2016-08-14

## 2016-08-14 MED ORDER — KETOROLAC TROMETHAMINE 15 MG/ML IJ SOLN
15.0000 mg | Freq: Once | INTRAMUSCULAR | Status: AC
Start: 2016-08-14 — End: 2016-08-15
  Administered 2016-08-15: 15 mg via INTRAVENOUS
  Filled 2016-08-14: qty 1

## 2016-08-14 MED ORDER — POTASSIUM CHLORIDE 20 MEQ PO PACK
40.0000 meq | PACK | Freq: Once | ORAL | Status: AC
Start: 2016-08-14 — End: 2016-08-14
  Administered 2016-08-14: 14:00:00 40 meq via ORAL
  Filled 2016-08-14: qty 2

## 2016-08-14 MED ORDER — FUROSEMIDE 10 MG/ML IJ SOLN
40.0000 mg | Freq: Once | INTRAMUSCULAR | Status: AC
Start: 2016-08-14 — End: 2016-08-14
  Administered 2016-08-14: 13:00:00 40 mg via INTRAVENOUS
  Filled 2016-08-14: qty 4

## 2016-08-14 MED ORDER — OXYCODONE HCL 5 MG/5ML PO SOLN
2.5000 mg | Freq: Four times a day (QID) | ORAL | Status: DC | PRN
Start: 2016-08-14 — End: 2016-08-17
  Administered 2016-08-16: 2.5 mg via ORAL
  Filled 2016-08-14: qty 5

## 2016-08-14 MED ORDER — SODIUM CHLORIDE 0.9 % IV SOLN
500.0000 mg | INTRAVENOUS | Status: DC
Start: 2016-08-14 — End: 2016-08-17
  Administered 2016-08-14 – 2016-08-17 (×4): 500 mg via INTRAVENOUS
  Filled 2016-08-14 (×4): qty 500

## 2016-08-14 MED ORDER — SODIUM CHLORIDE 0.9 % IV SOLN
INTRAVENOUS | Status: DC
Start: 2016-08-14 — End: 2016-08-14

## 2016-08-14 MED ORDER — BISACODYL 10 MG RE SUPP
10.0000 mg | Freq: Once | RECTAL | Status: DC
Start: 2016-08-14 — End: 2016-08-17

## 2016-08-14 MED ORDER — BISACODYL 10 MG RE SUPP
10.0000 mg | Freq: Once | RECTAL | Status: AC
Start: 2016-08-14 — End: 2016-08-14
  Administered 2016-08-14: 15:00:00 10 mg via RECTAL
  Filled 2016-08-14: qty 1

## 2016-08-14 MED ORDER — ACETAMINOPHEN 650 MG RE SUPP
650.0000 mg | RECTAL | Status: DC | PRN
Start: 2016-08-14 — End: 2016-08-17
  Administered 2016-08-15: 650 mg via RECTAL
  Filled 2016-08-14: qty 1

## 2016-08-14 MED ORDER — MORPHINE SULFATE 4 MG/ML IJ/IV SOLN (WRAP)
1.0000 mg | Status: AC | PRN
Start: 2016-08-14 — End: 2016-08-16
  Administered 2016-08-15: 1 mg via INTRAVENOUS
  Filled 2016-08-14: qty 1

## 2016-08-14 MED ORDER — SODIUM CHLORIDE 0.9 % IV MBP
1.0000 g | INTRAVENOUS | Status: DC
Start: 2016-08-14 — End: 2016-08-15
  Administered 2016-08-14: 15:00:00 1 g via INTRAVENOUS
  Filled 2016-08-14 (×2): qty 1000

## 2016-08-14 MED ORDER — POTASSIUM CHLORIDE CRYS ER 20 MEQ PO TBCR
40.0000 meq | EXTENDED_RELEASE_TABLET | Freq: Once | ORAL | Status: DC
Start: 2016-08-14 — End: 2016-08-14
  Filled 2016-08-14: qty 2

## 2016-08-14 NOTE — Progress Notes (Addendum)
[place]    Date Time: 08/14/16 12:13 PM  Patient Name: [facility]       Patient Active Problem List   Diagnosis   . SOB (shortness of breath)   . ARF (acute renal failure)   . Pulmonary edema   . Acute pulmonary edema   . Acute renal failure, unspecified acute renal failure type   . CKD (chronic kidney disease), unspecified stage   . Acute diastolic heart failure   . Hospital discharge follow-up   . Peripheral edema   . Pneumonia due to infectious organism, unspecified laterality, unspecified part of lung   . Chronic diastolic heart failure   . CKD (chronic kidney disease) stage 4, GFR 15-29 ml/min   . Chronic pain syndrome   . Arthritis   . Uncontrolled type 2 diabetes with renal manifestation   . Renovascular hypertension   . NSTEMI (non-ST elevated myocardial infarction)   . CHF (congestive heart failure)   . Acute renal failure superimposed on stage 3 chronic kidney disease   . Hyponatremia   . Normocytic anemia   . HTN (hypertension)   . Diabetes mellitus, type II   . OA (osteoarthritis)   . Acute on chronic diastolic congestive heart failure       Assessment:    Febrile illness, on clear etiology.   Diastolic HF.  Chronic with mild acute worsening.  NYHA Class II.  No over volume overload, but BNP 7700 on presentation and pulmonary vascular congestion on CXR. LVEF 75% on echo 05/27/15   Severe valve disease.  Last echo 05/27/15  ? Severe MR  ? Severe TR  ? Mild AS, mild to moderate AR    Type II MI.  Flat curve troponin with no anginal symptoms   Pacemaker 02/2016   Acute renal failure with elevated BUN. Hx of CKDz.   HTN   DM.   Anemia.   HLD.   Former Tobacco use. ? COPD.    Recommendations:    Given fluids overnight. Recent cxr shows worsening CHF. Iv hydration   Echo pending   Agree with ID consult.          Medications:      Scheduled Meds: PRN Meds:      aspirin EC 325 mg Oral Daily   atorvastatin 40 mg Oral QPM   docusate sodium 100 mg Oral BID    enoxaparin 30 mg Subcutaneous Daily   gabapentin 100 mg Oral TID   NIFEdipine 30 mg Oral Daily   pantoprazole 40 mg Oral QAM AC   potassium chloride 40 mEq Oral Once       Continuous Infusions:     acetaminophen 650 mg 4X Daily PRN   cyclobenzaprine 10 mg Q8H PRN   dextrose 15 g of glucose PRN   And     dextrose 12.5 g PRN   And     glucagon (rDNA) 1 mg PRN   insulin lispro 1-3 Units QHS PRN   insulin lispro 1-5 Units TID AC PRN   ondansetron 4 mg Q6H PRN   oxyCODONE 5 mg Q6H PRN             Subjective:   Not answering questions. Appears mildly sob      Physical Exam:     Vitals:    08/14/16 1052   BP:    Pulse:    Resp:    Temp: (!) 101.9 F (38.8 C)   SpO2:  Temp (24hrs), Avg:100.4 F (38 C), Min:98.2 F (36.8 C), Max:103 F (39.4 C)    Blood pressure 161/64, pulse 98, temperature (!) 101.9 F (38.8 C), temperature source Temporal Artery, resp. rate 18, height 1.626 m (5\' 4" ), weight 68 kg (150 lb), SpO2 97 %.    Telemetry reviewed no changes.     Intake and Output Summary (Last 24 hours) at Date Time    Intake/Output Summary (Last 24 hours) at 08/14/16 1213  Last data filed at 08/14/16 0600   Gross per 24 hour   Intake             1582 ml   Output                0 ml   Net             1582 ml       General Appearance:  Appears mildly SOB, no acute distress  Neck:  Unable to assess.   Lungs:  Clear to auscultation throughout, no wheezes, rhonchi or rales, good respiratory effort   Heart:  S1, S2 normal, no S3, no S4,2/6 SEM,    Abdomen:  Soft, non-tender, positive bowel sounds.  Extremities:  No cyanosis, clubbing or edema  Pulses:  Equal radial pulses, 4/4 symmetric  Neurologic: sleeping and non verbal at this time. Musculoskeletal: normal strength and tone    Labs:     Recent Labs  Lab 08/13/16  1312 08/13/16  0646 08/13/16  0115   Creatine Kinase (CK) 121 66 61   Troponin I 1.16* 1.24* 1.45*               Recent Labs  Lab 08/13/16  0115 08/12/16  1842   Bilirubin, Total 0.5 0.5   Bilirubin, Direct   --  0.2   Protein, Total 6.2 6.1   Albumin 3.3* 3.4*   ALT 17 19   AST (SGOT) 30 28       Recent Labs  Lab 08/14/16  0252   Magnesium 2.3       Recent Labs  Lab 08/13/16  0115   PT 13.3   PT INR 1.0   PTT 26       Recent Labs  Lab 08/14/16  0252 08/13/16  0115 08/12/16  1842   WBC 13.39* 11.90* 11.23*   Hgb 8.3* 9.4* 9.8*   Hematocrit 23.8* 26.1* 27.2*   Platelets 305 251 262       Recent Labs  Lab 08/14/16  0252 08/13/16  0115 08/12/16  1842 08/08/16  0133   Sodium 140 133*  --  142   Potassium 3.3* 4.5  --  4.3   Chloride 109 102  --  105   CO2 22 20*  --  25   BUN 90.6* 100.7*  --  33.0*   Creatinine 2.9* 4.1*  --  2.0*   i-STAT Creatinine  --   --  5.30*  --    EGFR 20.6 13.8  --  31.7   Glucose 191* 246*  --  92   Calcium 8.2 8.7  --  9.9       Recent Labs  Lab 08/14/16  0252   TSH 0.51       .  Lab Results   Component Value Date    BNP 7,758.8 (H) 08/13/2016      Estimated Creatinine Clearance: 14.7 mL/min (A) (based on SCr of 2.9 mg/dL (H)).    Weight Monitoring 08/04/2015 09/06/2015  05/26/2016 05/26/2016 08/08/2016 08/13/2016 08/13/2016   Height - - 160 cm 160 cm 157.5 cm - 162.6 cm   Height Method - - Stated Stated Stated - Stated   Weight 61.236 kg 61.236 kg 63.504 kg 63.504 kg 64 kg 66.679 kg 68.04 kg   Weight Method - - Stated Stated Stated Bed Scale -   BMI (calculated) - - 24.9 kg/m2 24.9 kg/m2 25.9 kg/m2 - 25.8 kg/m2                 Signed by: Laban Emperor, NP    Patient seen and examined.  My assessment and plan as above. Heart exam: RRR, Lungs clear.    Velora Mediate, M.D., [place]  NP [place] (8am-5pm)  MD [facility] [phone] (8am-5pm)  After hours, non urgent consult line [phone]  After Hours, urgent consults [phone]

## 2016-08-14 NOTE — Plan of Care (Signed)
Problem: Safety  Goal: Patient will be free from injury during hospitalization  Outcome: Progressing   08/13/16 0345   Goal/Interventions addressed this shift   Patient will be free from injury during hospitalization  Assess patient's risk for falls and implement fall prevention plan of care per policy;Provide and maintain safe environment;Ensure appropriate safety devices are available at the bedside;Hourly rounding;Assess for patients risk for elopement and implement [place] per policy       Problem: Psychosocial and Spiritual Needs  Goal: Demonstrates ability to cope with hospitalization/illness  Outcome: Progressing      Problem: Heart Failure  Goal: Stable vital signs and fluid balance  Outcome: Progressing   08/13/16 2000   Goal/Interventions addressed this shift   Stable vital signs and fluid balance Monitor/assess vital signs and telemetry per unit protocol;Weigh on admission and record weight daily;Assess signs and symptoms associated with cardiac rhythm changes;Monitor intake/output per unit protocol and/or LIP order;Monitor lab values;Monitor for leg swelling/edema and report to LIP if abnormal     Goal: Mobility/Activity is maintained at optimal level for patient  Outcome: Not Progressing   08/13/16 2000   Goal/Interventions addressed this shift   Mobility/activity is maintained at optimal level for patient Increase mobility as tolerated/progressive mobility;Encourage independent activity per ability;Maintain proper body alignment;Perform active/passive ROM;Reposition patient every 2 hours and as needed unless able to reposition self;Assess for changes in respiratory status, level of consciousness and/or development of fatigue;Plan activities to conserve energy, plan rest periods     Goal: Nutritional intake is adequate  Outcome: Progressing   08/13/16 2000   Goal/Interventions addressed this shift   Nutritional intake is adequate Monitor daily weights;Assist patient with meals/food  selection;Allow adequate time for meals;Encourage/perform oral hygiene as appropriate;Include patient/patient care companion in decisions related to nutrition;Assess anorexia, appetite, and amount of meal/food tolerated

## 2016-08-14 NOTE — Progress Notes (Signed)
[facility]  Progress Note  Patient Info:   Date/Time: 08/14/2016 / 2:49 PM   Admit Date:08/12/2016  Patient Name:Marco Bruce   YQM:[id number]   PCP: Candelaria Celeste, MD  Attending Physician:Sharanda Shinault, Stephenie Acres, MD     Assessment and Plan:     83 year old with past medical history of diffuse osteoarthritis, diastolic heart failure, chronic kidney disease, admitted for worsening joint pain, now having fever     ID: fever: ?  Source of infection.  Get ID consult.  Empirically placed on Rocephin and Zithromax   Chronic with mild acute worsening of diastolic heart failure/ severe MR on prior echo.   echo shows  dilated left ventricle with reduced ejection fraction of 40 percent, and moderate to severe MR.  Was given a dose of Lasix today.      Acute renal failure on chronic kidney disease: Appreciate nephrology's input.  Gentle IV fluids given  Recheck levels tomorrow.   Lasix was on hold, but did get a dose today   Diffuse joint pain/osteoarthritis: According to outpatient records, patient has been having bilateral knee pain, ankle, wrist, elbow pain off and on for over a year.  Continue supportive care.  Continue oxycodone and Flexeril as needed   Disposition    DVT Prohylaxis:lovenox   Central Line/Foley Catheter/PICC line status: none  Code Status: Full Code  Disposition:home  Type of Admission:Inpatient  Expected Date of Discharge: 2-3  Milestones required for discharge: see above  Hospital Problems:   Principal Problem:    Acute on chronic diastolic congestive heart failure  Active Problems:    NSTEMI (non-ST elevated myocardial infarction)    CHF (congestive heart failure)    Acute renal failure superimposed on stage 3 chronic kidney disease    Hyponatremia    Normocytic anemia    HTN (hypertension)    Diabetes mellitus, type II    OA (osteoarthritis)    Subjective:   08/14/16 , fever, joint pain  Chief Complaint:  Leg Pain and Shoulder Pain    Review of Systems   Constitutional: Positive for fever  and malaise/fatigue. Negative for chills.   Respiratory: Positive for shortness of breath. Negative for cough, hemoptysis and sputum production.    Cardiovascular: Negative for chest pain, palpitations and orthopnea.   Gastrointestinal: Negative for abdominal pain, nausea and vomiting.   Musculoskeletal: Positive for joint pain.   Neurological: Positive for weakness.     Objective:     Vitals:    08/14/16 0813 08/14/16 0820 08/14/16 1052 08/14/16 1300   BP:  161/64     Pulse:  98     Resp:  18     Temp: (!) 102.3 F (39.1 C)  (!) 101.9 F (38.8 C)    TempSrc: Oral  Temporal Artery Oral   SpO2:  97%     Weight:       Height:         Physical Exam:   Physical Exam   Constitutional: He is well-developed, well-nourished, and in no distress.   Neck: Normal range of motion. Neck supple.   Cardiovascular: Normal rate and regular rhythm.    Pulmonary/Chest: Effort normal and breath sounds normal. No respiratory distress. He has no wheezes.   Abdominal: Soft. Bowel sounds are normal. He exhibits no distension. There is no tenderness. There is no rebound.   Musculoskeletal: He exhibits tenderness. He exhibits no edema.   c/o of diffuse joint pain during passive ROM     Results of  Labs/imaging   Labs and radiology reports have been reviewed.    Hospitalist   Signed by:   Carney Bern  08/14/2016 2:49 PM    *This note was generated by the Epic EMR system/ Dragon speech recognition and may contain inherent errors or omissions not intended by the user. Grammatical errors, random word insertions, deletions, pronoun errors and incomplete sentences are occasional consequences of this technology due to software limitations. Not all errors are caught or corrected. If there are questions or concerns about the content of this note or information contained within the body of this dictation they should be addressed directly with the author for clarification

## 2016-08-14 NOTE — Plan of Care (Signed)
Problem: Moderate/High Fall Risk Score >5  Goal: Patient will remain free of falls  Outcome: Progressing   08/14/16 1000   OTHER   Moderate Risk (6-13) MOD-Remain with patient during toileting;MOD-Place Fall Risk level on whiteboard in room   Bed alarm activated. Call light in reach. Hourly rounding in place.    Problem: Pain  Goal: Pain at adequate level as identified by patient  Outcome: Progressing   08/14/16 2053   Goal/Interventions addressed this shift   Pain at adequate level as identified by patient Identify patient comfort function goal;Reassess pain within 30-60 minutes of any procedure/intervention, per Pain Assessment, Intervention, Reassessment (AIR) Cycle;Evaluate if patient comfort function goal is met;Offer non-pharmacological pain management interventions;Evaluate patient's satisfaction with pain management progress       Problem: Heart Failure  Goal: Stable vital signs and fluid balance  Outcome: Progressing   08/14/16 2053   Goal/Interventions addressed this shift   Stable vital signs and fluid balance Monitor/assess vital signs and telemetry per unit protocol;Assess signs and symptoms associated with cardiac rhythm changes;Monitor intake/output per unit protocol and/or LIP order;Monitor lab values;Monitor for leg swelling/edema and report to LIP if abnormal

## 2016-08-14 NOTE — Consults (Signed)
INFECTIOUS DISEASE CONSULT  Marco Royals, MD, [place]          Date Time: 08/14/16 2:33 PM  Patient Name: Marco Bruce  Referring Physician: Carney Bern, MD      Reason for consult:     Fever, altered mental status, congestive heart failure    History of present illness:     Marco Bruce CSN:[id number]:[id number] is a 83 y.o. male, whose history is obtained from the records as he is not providing any history.  According to the records he has a history of hypertension, diabetes mellitus, gout, congestive heart failure, renal insufficiency, osteoarthritis, who was admitted with worsening, joint pains, generalized weakness, malaise, shortness of breath.  Was becoming more lethargic and developed altered mental status.  He started spiking fevers .  He is in acute renal failure.  He was having cough and sputum production.  No documentation of any seizure activity .    Review of systems:     No documentation of any hematemesis, hemoptysis, melena. .  No significant chest pain .  He was complaining of fatigue, malaise, cough, sputum production, shortness of breath and joint pains .  He is constipated. Other review of systems are noncontributory.    Allergies:     No Known Allergies    Past medical history:     Past Medical History:   Diagnosis Date   . Congestive heart failure (CHF)    . Diabetes mellitus    . Gout     chronic   . Hypertension        Past surgical history:     Past Surgical History:   Procedure Laterality Date   . ABDOMINAL SURGERY         Family history:     History reviewed. No pertinent family history.    Social history:     History   Alcohol Use No     Comment: former alcoholic     History   Drug Use No     History   Smoking Status   . Never Smoker   Smokeless Tobacco   . Never Used       Medications:     Current Facility-Administered Medications   Medication Dose Route Frequency   . aspirin EC  325 mg Oral Daily   . atorvastatin  40 mg Oral QPM   . azithromycin  500 mg Intravenous Q24H [place]   .  cefTRIAXone  1 g Intravenous Q24H [place]   . docusate sodium  100 mg Oral BID   . enoxaparin  30 mg Subcutaneous Daily   . gabapentin  100 mg Oral TID   . NIFEdipine  30 mg Oral Daily   . pantoprazole  40 mg Oral QAM AC       Physical Exam:     Blood pressure 161/64, pulse 98, temperature (!) 101.9 F (38.8 C), temperature source Temporal Artery, resp. rate 18, height 1.626 m (5\' 4" ), weight 68 kg (150 lb), SpO2 97 %.    General Appearance: Sick-looking.   HEENT: Pallor positive, Anicteric sclera.   Neck: Supple  Lungs: Bilateral coarse breath sounds, few scattered crackles   Chest Wall: Symmetric chest wall expansion.   Heart : S1 and S2.   Abdomen: Abdomen is soft. There are no signs of ascites. Bowel sounds are normal. There is no abdominal tenderness.   Neurological:  Very weak and lethargic; poorly responsive     Labs:     Recent Labs  08/14/16   0252  08/13/16   0115   WBC  13.39*  11.90*   Hgb  8.3*  9.4*   Hematocrit  23.8*  26.1*   Platelets  305  251   MCV  89.5  86.4       Recent Labs      08/14/16   0252  08/13/16   0115   Sodium  140  133*   Potassium  3.3*  4.5   Chloride  109  102   CO2  22  20*   BUN  90.6*  100.7*   Creatinine  2.9*  4.1*   Glucose  191*  246*   Calcium  8.2  8.7   Magnesium  2.3  2.7*   Phosphorus  3.5  4.3       Recent Labs      08/13/16   0115  08/12/16   1842   AST (SGOT)  30  28   ALT  17  19   Alkaline Phosphatase  69  75   Protein, Total  6.2  6.1   Albumin  3.3*  3.4*   Bilirubin, Total  0.5  0.5       Imaging studies:     Chest x-ray : Hypoventilation limits evaluation. No consolidation    Assessment :     Marco Bruce is a 83 y.o. male, with:     Systemic inflammatory response syndrome   Altered mental status   Bronchitis   Acute on chronic renal failure   Pulmonary hypertension   Osteoarthritis   Gout   Diabetes mellitus   Hypertension   Congestive heart failure   Status post pacemaker placement    Recommendations:     I would like to suggest following  approach:     Rocephin 1 g IV daily   Zithromax 500 mg IV daily   Respiratory virus viral PCR   Dulcolax suppository   Uric acid   Correction of electrolytes   Cardiology follow-up   Nephrology follow-up   Repeat blood cultures if spikes more than 100.5    Repeat complete blood count complete metabolic panel tomorrow   We'll adjust the antimicrobials according to the cultures and clinical course     I will follow this patient closely with you    Thank you  [facility] for involving me in care of Marco Bruce          Signed by: Marco Royals, MD, FACP  Date Time: 08/14/16 2:33 PM      *This note was generated by the Epic EMR system/ Dragon speech recognition and may contain inherent errors or omissions not intended by the user. Grammatical errors, random word insertions, deletions, pronoun errors and incomplete sentences are occasional consequences of this technology due to software limitations. Not all errors are caught or corrected. If there are questions or concerns about the content of this note or information contained within the body of this dictation they should be addressed directly with the author for clarification

## 2016-08-14 NOTE — Progress Notes (Signed)
[facility].  Progress Note    Assessment:   AKI - prerenal due to volume depletion   CKD stage IIIb- Etiology HTN- baseline Cr 2-2.3 since 2017   Fever   Mild hyponatremia   Diastolic CHF   Severe valve disease- Severe MR, Severe TR, Mild AS   DM2   HTN   Anemia of CKD    Plan:  -Renal fx improving steadily with IVF  -Hold diuretics and continue IVF with saline at 100 cc/hr  -Blood cx today and delineate source of infection as he is febrile  -Replete potassium with 40 Meq PO  -Check iron studies    Flossie Buffy, MD  Office - [phone]  ++++++++++++++++++++++++++++++++++++++++++++++++++++++++++++++  Subjective:  c/o fever today    Medications:  Scheduled Meds:  Current Facility-Administered Medications   Medication Dose Route Frequency   . aspirin EC  325 mg Oral Daily   . atorvastatin  40 mg Oral QPM   . docusate sodium  100 mg Oral BID   . enoxaparin  30 mg Subcutaneous Daily   . gabapentin  100 mg Oral TID   . NIFEdipine  30 mg Oral Daily   . pantoprazole  40 mg Oral QAM AC     Continuous Infusions:  . sodium chloride 100 mL/hr at 08/14/16 0242     PRN Meds:acetaminophen, cyclobenzaprine, Nursing communication: Adult Hypoglycemia Treatment Algorithm **AND** dextrose **AND** dextrose **AND** glucagon (rDNA), insulin lispro, insulin lispro, ondansetron, oxyCODONE    Objective:  Vital signs in last 24 hours:  Temp:  [98.2 F (36.8 C)-103 F (39.4 C)] 101.9 F (38.8 C)  Heart Rate:  [91-98] 98  Resp Rate:  [16-22] 18  BP: (138-161)/(62-68) 161/64  Intake/Output last 24 hours:    Intake/Output Summary (Last 24 hours) at 08/14/16 1053  Last data filed at 08/14/16 0600   Gross per 24 hour   Intake             1682 ml   Output                0 ml   Net             1682 ml     Intake/Output this shift:  No intake/output data recorded.    Physical Exam:   Gen: WD WN NAD   CV: S1 S2 N RRR   Chest: CTAB   Ab: ND NT soft no HSM +BS   Ext: No C/E    Labs:    Recent Labs  Lab  08/14/16  0252 08/13/16  0115 08/12/16  1842 08/08/16  0133   Glucose 191* 246*  --  92   BUN 90.6* 100.7*  --  33.0*   Creatinine 2.9* 4.1*  --  2.0*   i-STAT Creatinine  --   --  5.30*  --    Calcium 8.2 8.7  --  9.9   Sodium 140 133*  --  142   Potassium 3.3* 4.5  --  4.3   Chloride 109 102  --  105   CO2 22 20*  --  25   Albumin  --  3.3* 3.4* 4.4   Phosphorus 3.5 4.3  --   --    Magnesium 2.3 2.7*  --   --        Recent Labs  Lab 08/14/16  0252 08/13/16  0115 08/12/16  1842   WBC 13.39* 11.90* 11.23*   Hgb 8.3* 9.4* 9.8*  Hematocrit 23.8* 26.1* 27.2*   MCV 89.5 86.4 86.6   MCH 31.2 31.1 31.2   MCHC 34.9 36.0 36.0   RDW 15 14 14    MPV 11.3 11.1 11.4   Platelets [phone]

## 2016-08-15 ENCOUNTER — Inpatient Hospital Stay: Payer: Medicare Other

## 2016-08-15 LAB — COMPREHENSIVE METABOLIC PANEL
ALT: 13 U/L (ref 0–55)
AST (SGOT): 25 U/L (ref 5–34)
Albumin/Globulin Ratio: 1 (ref 0.9–2.2)
Albumin: 2.6 g/dL — ABNORMAL LOW (ref 3.5–5.0)
Alkaline Phosphatase: 83 U/L (ref 38–106)
Anion Gap: 14 (ref 5.0–15.0)
BUN: 84.1 mg/dL — ABNORMAL HIGH (ref 9.0–28.0)
Bilirubin, Total: 0.6 mg/dL (ref 0.2–1.2)
CO2: 20 mEq/L — ABNORMAL LOW (ref 22–29)
Calcium: 8.3 mg/dL (ref 7.9–10.2)
Chloride: 105 mEq/L (ref 100–111)
Creatinine: 2.8 mg/dL — ABNORMAL HIGH (ref 0.7–1.3)
Globulin: 2.6 g/dL (ref 2.0–3.6)
Glucose: 345 mg/dL — ABNORMAL HIGH (ref 70–100)
Potassium: 4 mEq/L (ref 3.5–5.1)
Protein, Total: 5.2 g/dL — ABNORMAL LOW (ref 6.0–8.3)
Sodium: 139 mEq/L (ref 136–145)

## 2016-08-15 LAB — CBC AND DIFFERENTIAL
Absolute NRBC: 0 10*3/uL
Basophils Absolute Automated: 0.03 10*3/uL (ref 0.00–0.20)
Basophils Automated: 0.2 %
Eosinophils Absolute Automated: 0.19 10*3/uL (ref 0.00–0.70)
Eosinophils Automated: 1.4 %
Hematocrit: 25.8 % — ABNORMAL LOW (ref 42.0–52.0)
Hgb: 8.7 g/dL — ABNORMAL LOW (ref 13.0–17.0)
Immature Granulocytes Absolute: 0.46 10*3/uL — ABNORMAL HIGH
Immature Granulocytes: 3.4 %
Lymphocytes Absolute Automated: 0.98 10*3/uL (ref 0.50–4.40)
Lymphocytes Automated: 7.2 %
MCH: 30.5 pg (ref 28.0–32.0)
MCHC: 33.7 g/dL (ref 32.0–36.0)
MCV: 90.5 fL (ref 80.0–100.0)
MPV: 11 fL (ref 9.4–12.3)
Monocytes Absolute Automated: 1.53 10*3/uL — ABNORMAL HIGH (ref 0.00–1.20)
Monocytes: 11.3 %
Neutrophils Absolute: 10.4 10*3/uL — ABNORMAL HIGH (ref 1.80–8.10)
Neutrophils: 76.5 %
Nucleated RBC: 0 /100 WBC (ref 0.0–1.0)
Platelets: 327 10*3/uL (ref 140–400)
RBC: 2.85 10*6/uL — ABNORMAL LOW (ref 4.70–6.00)
RDW: 15 % (ref 12–15)
WBC: 13.59 10*3/uL — ABNORMAL HIGH (ref 3.50–10.80)

## 2016-08-15 LAB — RESPIRATORY PATHOGEN PANEL, PCR (FILMARRAY) (SOFT)
Adenovirus: NOT DETECTED
Bordetella pertussis: NOT DETECTED
Chlamydophila pneumoniae: NOT DETECTED
Coronavirus 229E: NOT DETECTED
Coronavirus HKU1: NOT DETECTED
Coronavirus NL63: NOT DETECTED
Coronavirus OC43: NOT DETECTED
Human Metapneumovirus: NOT DETECTED
Human Rhinovirus/Enterovirus: NOT DETECTED
Influenza A/H1: NOT DETECTED
Influenza A/H3: NOT DETECTED
Influenza A: NOT DETECTED
Influenza AH1 - 2009: NOT DETECTED
Influenza B: NOT DETECTED
Mycoplasma pneumoniae: NOT DETECTED
Parainfluenza Virus 1: NOT DETECTED
Parainfluenza Virus 2: NOT DETECTED
Parainfluenza Virus 3: NOT DETECTED
Parainfluenza Virus 4: NOT DETECTED
Respiratory Syncytial Virus: NOT DETECTED

## 2016-08-15 LAB — GLUCOSE WHOLE BLOOD - POCT
Whole Blood Glucose POCT: 295 mg/dL — ABNORMAL HIGH (ref 70–100)
Whole Blood Glucose POCT: 322 mg/dL — ABNORMAL HIGH (ref 70–100)
Whole Blood Glucose POCT: 297 mg/dL — ABNORMAL HIGH (ref 70–100)
Whole Blood Glucose POCT: 255 mg/dL — ABNORMAL HIGH (ref 70–100)

## 2016-08-15 LAB — GFR: EGFR: 21.5

## 2016-08-15 LAB — URIC ACID: Uric acid: 11.7 mg/dL — ABNORMAL HIGH (ref 3.6–9.7)

## 2016-08-15 MED ORDER — ALLOPURINOL 100 MG PO TABS
100.0000 mg | ORAL_TABLET | Freq: Every day | ORAL | Status: DC
Start: 2016-08-15 — End: 2016-08-17
  Administered 2016-08-15 – 2016-08-17 (×3): 100 mg via ORAL
  Filled 2016-08-15 (×3): qty 1

## 2016-08-15 MED ORDER — SODIUM CHLORIDE 0.9 % IV MBP
2.2500 g | Freq: Three times a day (TID) | INTRAVENOUS | Status: DC
Start: 2016-08-15 — End: 2016-08-17
  Administered 2016-08-15 – 2016-08-17 (×6): 2.25 g via INTRAVENOUS
  Filled 2016-08-15 (×7): qty 2.25

## 2016-08-15 MED ORDER — IRON SUCROSE 20 MG/ML IV SOLN
200.0000 mg | INTRAVENOUS | Status: DC
Start: 2016-08-15 — End: 2016-08-17
  Administered 2016-08-15 – 2016-08-17 (×3): 200 mg via INTRAVENOUS
  Filled 2016-08-15 (×3): qty 10

## 2016-08-15 MED ORDER — PREDNISONE 20 MG PO TABS
20.0000 mg | ORAL_TABLET | Freq: Every morning | ORAL | Status: AC
Start: 2016-08-15 — End: 2016-08-17
  Administered 2016-08-15 – 2016-08-17 (×3): 20 mg via ORAL
  Filled 2016-08-15 (×3): qty 1

## 2016-08-15 NOTE — Plan of Care (Signed)
Problem: Moderate/High Fall Risk Score >5  Goal: Patient will remain free of falls  Outcome: Progressing   08/15/16 0745   OTHER   Moderate Risk (6-13) MOD-Consider activation of bed alarm if appropriate;MOD-Use of assistive devices-bedside commode if appropriate;MOD-Remain with patient during toileting       Problem: Safety  Goal: Patient will be free from injury during hospitalization  Outcome: Progressing   08/15/16 1053   Goal/Interventions addressed this shift   Patient will be free from injury during hospitalization  Assess patient's risk for falls and implement fall prevention plan of care per policy;Provide and maintain safe environment;Use appropriate transfer methods;Ensure appropriate safety devices are available at the bedside;Include patient/ family/ care giver in decisions related to safety;Hourly rounding;Assess for patients risk for elopement and implement Elopement Risk Plan per policy     Goal: Patient will be free from infection during hospitalization  Outcome: Progressing   08/15/16 1053   Goal/Interventions addressed this shift   Free from Infection during hospitalization Assess and monitor for signs and symptoms of infection;Monitor lab/diagnostic results;Monitor all insertion sites (i.e. indwelling lines, tubes, urinary catheters, and drains);Encourage patient and family to use good hand hygiene technique       Problem: Pain  Goal: Pain at adequate level as identified by patient  Outcome: Progressing   08/15/16 1053   Goal/Interventions addressed this shift   Pain at adequate level as identified by patient Identify patient comfort function goal;Reassess pain within 30-60 minutes of any procedure/intervention, per Pain Assessment, Intervention, Reassessment (AIR) Cycle;Evaluate if patient comfort function goal is met;Evaluate patient's satisfaction with pain management progress;Offer non-pharmacological pain management interventions;Assess for risk of opioid induced respiratory depression,  including snoring/sleep apnea. Alert healthcare team of risk factors identified.

## 2016-08-15 NOTE — Progress Notes (Signed)
[facility]  Progress Note  Patient Info:   Date/Time: 08/15/2016 / 4:27 PM   Admit Date:08/12/2016  Patient Name:Marco Bruce   DZH:[id number]   PCP: Candelaria Celeste, MD  Attending Physician:Aneyah Lortz, Stephenie Acres, MD     Assessment and Plan:     83 year old with past medical history of diffuse osteoarthritis, diastolic heart failure, chronic kidney disease, admitted for worsening joint pain, now having fever     ID: fever: ?  Source of infection.  CT of abdomen pelvis did not show any acute pathology.  Empirically on Zithromax and Zosyn.  Pro-calcitonin elevated at 3.35   Chronic with mild acute worsening of diastolic heart failure/ severe MR on prior echo.   echo shows  dilated left ventricle with reduced ejection fraction of 40 percent, and moderate to severe MR.  continue holding Lasix     Acute renal failure on chronic kidney disease: creatinine improved; at baseline   Diffuse joint pain/osteoarthritis: uric acid 11.3; agree with low dose prednisone for possible acute gout   Disposition    DVT Prohylaxis:lovenox   Central Line/Foley Catheter/PICC line status: none  Code Status: Full Code  Disposition:home  Type of Admission:Inpatient  Expected Date of Discharge: 2-3  Milestones required for discharge: see above  Hospital Problems:   Principal Problem:    Acute on chronic diastolic congestive heart failure  Active Problems:    NSTEMI (non-ST elevated myocardial infarction)    CHF (congestive heart failure)    Acute renal failure superimposed on stage 3 chronic kidney disease    Hyponatremia    Normocytic anemia    HTN (hypertension)    Diabetes mellitus, type II    OA (osteoarthritis)    Fever and chills    Subjective:   08/15/16 , fever, joint pain  Chief Complaint:  Leg Pain and Shoulder Pain    Review of Systems   Constitutional: Positive for fever and malaise/fatigue. Negative for chills.   Respiratory: Positive for shortness of breath. Negative for cough, hemoptysis and sputum production.     Cardiovascular: Negative for chest pain, palpitations and orthopnea.   Gastrointestinal: Negative for abdominal pain, nausea and vomiting.   Musculoskeletal: Positive for joint pain.   Neurological: Positive for weakness.     Objective:     Vitals:    08/15/16 0306 08/15/16 0626 08/15/16 0916 08/15/16 1405   BP:  128/65 109/63 113/67   Pulse:  76 74 68   Resp:  20 16 17    Temp: 99.1 F (37.3 C) 97.5 F (36.4 C) 97.5 F (36.4 C) 97.2 F (36.2 C)   TempSrc: Axillary Axillary Oral    SpO2:  100% 99% 99%   Weight:       Height:         Physical Exam:   Physical Exam   Constitutional: He is well-developed, well-nourished, and in no distress.   Neck: Normal range of motion. Neck supple.   Cardiovascular: Normal rate and regular rhythm.    Pulmonary/Chest: Effort normal and breath sounds normal. No respiratory distress. He has no wheezes.   Abdominal: Soft. Bowel sounds are normal. He exhibits no distension. There is no tenderness. There is no rebound.   Musculoskeletal: He exhibits tenderness. He exhibits no edema.   c/o of diffuse joint pain during passive ROM     Results of Labs/imaging   Labs and radiology reports have been reviewed.    Hospitalist   Signed by:   Carney Bern  08/15/2016 4:27 PM    *  This note was generated by the Epic EMR system/ Dragon speech recognition and may contain inherent errors or omissions not intended by the user. Grammatical errors, random word insertions, deletions, pronoun errors and incomplete sentences are occasional consequences of this technology due to software limitations. Not all errors are caught or corrected. If there are questions or concerns about the content of this note or information contained within the body of this dictation they should be addressed directly with the author for clarification

## 2016-08-15 NOTE — Progress Notes (Addendum)
[place]    Date Time: 08/15/16 10:02 AM  Patient Name: Marco Bruce       Patient Active Problem List   Diagnosis   . SOB (shortness of breath)   . ARF (acute renal failure)   . Pulmonary edema   . Acute pulmonary edema   . Acute renal failure, unspecified acute renal failure type   . CKD (chronic kidney disease), unspecified stage   . Acute diastolic heart failure   . Hospital discharge follow-up   . Peripheral edema   . Pneumonia due to infectious organism, unspecified laterality, unspecified part of lung   . Chronic diastolic heart failure   . CKD (chronic kidney disease) stage 4, GFR 15-29 ml/min   . Chronic pain syndrome   . Arthritis   . Uncontrolled type 2 diabetes with renal manifestation   . Renovascular hypertension   . NSTEMI (non-ST elevated myocardial infarction)   . CHF (congestive heart failure)   . Acute renal failure superimposed on stage 3 chronic kidney disease   . Hyponatremia   . Normocytic anemia   . HTN (hypertension)   . Diabetes mellitus, type II   . OA (osteoarthritis)   . Acute on chronic diastolic congestive heart failure   . Fever and chills       Assessment:      Febrile illness, on clear etiology.Probable pneumonia   Diastolic HF. Chronic with mild acute worsening. NYHA Class II. No over volume overload, but BNP 7700 on presentation and pulmonary vascular congestion on CXR.    Ef reduced from 75%, to 40% Severe valve disease. by  Echo 08/15/16.  ? Severe MR  ? Severe TR  ? Mild AS, mild to moderate AR    Type II MI. Flat curve troponin with no anginal symptoms   Pacemaker 02/2016   Acute renal failure with elevated BUN. Hx of CKDz.   HTN   DM.   Anemia.   HLD.   Former Tobacco use. ? COPD.         Recommendations:    Continue treatment for infection process.    No further cardiac work up needed at this time.    Will sign off at this, please call back should we need to revisit.       Medications:      Scheduled Meds: PRN  Meds:      aspirin EC 325 mg Oral Daily   atorvastatin 40 mg Oral QPM   azithromycin 500 mg Intravenous Q24H SCH   bisacodyl 10 mg Rectal Once   enoxaparin 30 mg Subcutaneous Daily   gabapentin 100 mg Oral TID   NIFEdipine 30 mg Oral Daily   pantoprazole 40 mg Oral QAM AC   piperacillin-tazobactam 2.25 g Intravenous Q8H   senna-docusate 2 tablet Oral BID       Continuous Infusions:     acetaminophen 650 mg Q4H PRN   acetaminophen 650 mg 4X Daily PRN   cyclobenzaprine 10 mg Q8H PRN   dextrose 15 g of glucose PRN   And     dextrose 12.5 g PRN   And     glucagon (rDNA) 1 mg PRN   insulin lispro 1-3 Units QHS PRN   insulin lispro 1-5 Units TID AC PRN   morphine 1 mg Q2H PRN   ondansetron 4 mg Q6H PRN   oxyCODONE 2.5 mg Q6H PRN             Subjective:  Denies chest pain, SOB or palpitations.      Physical Exam:     Vitals:    08/15/16 0916   BP: 109/63   Pulse: 74   Resp: 16   Temp: 97.5 F (36.4 C)   SpO2: 99%     Temp (24hrs), Avg:100.6 F (38.1 C), Min:97.5 F (36.4 C), Max:104.2 F (40.1 C)      Telemetry reviewed no changes.     Intake and Output Summary (Last 24 hours) at Date Time    Intake/Output Summary (Last 24 hours) at 08/15/16 1002  Last data filed at 08/15/16 0911   Gross per 24 hour   Intake              900 ml   Output                0 ml   Net              900 ml       General Appearance:  Breathing comfortable, no acute distress  Neck:  No jugular venous distension, brisk carotid upstroke  Lungs:  Scattered rhonchi bil.   Heart:  S1, S2 normal, 2/6 SEM no rub   Abdomen:  Soft, non-tender, positive bowel sounds, no hepatojugular reflux  Extremities:  No edema  Pulses:  Equal radial pulses, 4/4 symmetric  Neurologic:  Alert and oriented x3, mood and affect normal  Musculoskeletal: normal strength and tone    Labs:     Recent Labs  Lab 08/13/16  1312 08/13/16  0646 08/13/16  0115   Creatine Kinase (CK) 121 66 61   Troponin I 1.16* 1.24* 1.45*               Recent Labs  Lab 08/15/16  0735   08/12/16  1842   Bilirubin, Total 0.6 More results in Results Review 0.5   Bilirubin, Direct  --   --  0.2   Protein, Total 5.2* More results in Results Review 6.1   Albumin 2.6* More results in Results Review 3.4*   ALT 13 More results in Results Review 19   AST (SGOT) 25 More results in Results Review 28   More results in Results Review = values in this interval not displayed.    Recent Labs  Lab 08/14/16  0252   Magnesium 2.3       Recent Labs  Lab 08/13/16  0115   PT 13.3   PT INR 1.0   PTT 26       Recent Labs  Lab 08/15/16  0735 08/14/16  0252 08/13/16  0115   WBC 13.59* 13.39* 11.90*   Hgb 8.7* 8.3* 9.4*   Hematocrit 25.8* 23.8* 26.1*   Platelets 327 305 251       Recent Labs  Lab 08/15/16  0735 08/14/16  0252 08/13/16  0115   Sodium 139 140 133*   Potassium 4.0 3.3* 4.5   Chloride 105 109 102   CO2 20* 22 20*   BUN 84.1* 90.6* 100.7*   Creatinine 2.8* 2.9* 4.1*   EGFR 21.5 20.6 13.8   Glucose 345* 191* 246*   Calcium 8.3 8.2 8.7       Recent Labs  Lab 08/14/16  0252   TSH 0.51       .  Lab Results   Component Value Date    BNP 7,758.8 (H) 08/13/2016      Estimated Creatinine Clearance: 15.3 mL/min (A) (based on SCr of  2.8 mg/dL (H)).    Weight Monitoring 08/04/2015 09/06/2015 05/26/2016 05/26/2016 08/08/2016 08/13/2016 08/13/2016   Height - - 160 cm 160 cm 157.5 cm - 162.6 cm   Height Method - - Stated Stated Stated - Stated   Weight 61.236 kg 61.236 kg 63.504 kg 63.504 kg 64 kg 66.679 kg 68.04 kg   Weight Method - - Stated Stated Stated Bed Scale -   BMI (calculated) - - 24.9 kg/m2 24.9 kg/m2 25.9 kg/m2 - 25.8 kg/m2                 Signed by: Laban Emperor, NP      Patient seen and examined.  My assessment and plan as above. Heart exam: RRR, Lungs rhonchi    Velora Mediate, M.D., [place]  NP [place] (8am-5pm)  MD Spectralink [phone] (8am-5pm)  After hours, non urgent consult line [phone]  After Hours, urgent consults [phone]

## 2016-08-15 NOTE — Progress Notes (Addendum)
[facility].  Progress Note    Assessment:   AKI - prerenal due to volume depletion,renal sonogram with right renal atrophy,no hydronephrosis and UA normal   Anemia with severe Iron NOM:VEHM 6%   CKD stage IIIb- Etiology HTN- baseline Cr 2-2.3 since 2017   Fever   Mild hyponatremia   Diastolic CHF   Severe valve disease- Severe MR, Severe TR, Mild AS   DM2   HTN    Plan:  -Renal fx improved to baseline today  -Avoid NSAIDS please,had ketorolac yesterday for pain  -Continue ot hold diuretics for now  -Decrease IVF to 75 cc/hr,cautious with fluids given valvular heart disease  -IV Iron course  -Hemoccult stools(CT scan of abd was negative )  -Given uric acid of 11.3 and bilateral knee pain a course of steroids could be attempted at a low dose of 20 mg daily for 3 days for possible gout  -All cx negative by far,abx per ID    Flossie Buffy, MD  Office - [phone]  ++++++++++++++++++++++++++++++++++++++++++++++++++++++++++++++  Subjective:  No new complaints  He feels well today except for knee joint pain  Medications:  Scheduled Meds:  Current Facility-Administered Medications   Medication Dose Route Frequency   . aspirin EC  325 mg Oral Daily   . atorvastatin  40 mg Oral QPM   . azithromycin  500 mg Intravenous Q24H [place]   . bisacodyl  10 mg Rectal Once   . enoxaparin  30 mg Subcutaneous Daily   . gabapentin  100 mg Oral TID   . NIFEdipine  30 mg Oral Daily   . pantoprazole  40 mg Oral QAM AC   . piperacillin-tazobactam  2.25 g Intravenous Q8H   . senna-docusate  2 tablet Oral BID     Continuous Infusions:  PRN Meds:acetaminophen, acetaminophen, cyclobenzaprine, Nursing communication: Adult Hypoglycemia Treatment Algorithm **AND** dextrose **AND** dextrose **AND** glucagon (rDNA), insulin lispro, insulin lispro, morphine, ondansetron, oxyCODONE    Objective:  Vital signs in last 24 hours:  Temp:  [97.2 F (36.2 C)-104.2 F (40.1 C)] 97.2 F (36.2 C)  Heart Rate:   [68-100] 68  Resp Rate:  [16-27] 17  BP: (109-137)/(61-67) 113/67  Intake/Output last 24 hours:    Intake/Output Summary (Last 24 hours) at 08/15/16 1450  Last data filed at 08/15/16 1400   Gross per 24 hour   Intake              790 ml   Output                0 ml   Net              790 ml     Intake/Output this shift:  I/O this shift:  In: 290 [P.O.:120; I.V.:170]  Out: -     Physical Exam:   Gen: WD WN NAD   CV: S1 S2 N RRR   Chest: CTAB   Ab: ND NT soft no HSM +BS   Ext: No C/E    Labs:    Recent Labs  Lab 08/15/16  0735 08/14/16  0252 08/13/16  0115 08/12/16  1842   Glucose 345* 191* 246*  --    BUN 84.1* 90.6* 100.7*  --    Creatinine 2.8* 2.9* 4.1*  --    i-STAT Creatinine  --   --   --  5.30*   Calcium 8.3 8.2 8.7  --    Sodium 139 140 133*  --  Potassium 4.0 3.3* 4.5  --    Chloride 105 109 102  --    CO2 20* 22 20*  --    Albumin 2.6*  --  3.3* 3.4*   Phosphorus  --  3.5 4.3  --    Magnesium  --  2.3 2.7*  --        Recent Labs  Lab 08/15/16  0735 08/14/16  0252 08/13/16  0115   WBC 13.59* 13.39* 11.90*   Hgb 8.7* 8.3* 9.4*   Hematocrit 25.8* 23.8* 26.1*   MCV 90.5 89.5 86.4   MCH 30.5 31.2 31.1   MCHC 33.7 34.9 36.0   RDW 15 15 14    MPV 11.0 11.3 11.1   Platelets 327 305 251

## 2016-08-15 NOTE — Plan of Care (Addendum)
Problem: Safety  Goal: Patient will be free from injury during hospitalization  Outcome: Progressing   08/15/16 0220   Goal/Interventions addressed this shift   Patient will be free from injury during hospitalization  Assess patient's risk for falls and implement fall prevention plan of care per policy;Provide and maintain safe environment;Use appropriate transfer methods;Ensure appropriate safety devices are available at the bedside;Include patient/ family/ care giver in decisions related to safety;Hourly rounding;Assess for patients risk for elopement and implement [place] per policy;Provide alternative method of communication if needed (communication boards, writing)       Comments:   Patient has been lethargic all shift, does respond to voice of his daughter and at times was seen talking to her. Pt has been in pain upon touching upper and lower extremeties. Pt moans and groans loudly when pt is being turned and repositioned and when incontinence care is being provided. Pt also has been febrile TMAX-104.2. Hospitalist was made aware. Ice packs applied. Pt given one dose of Morphine 1mg  IVP, one dose of Toradol 15mg  IVP and Rectal Tylenol Suppository. Temp down to 102. Call bell within reach. Will continue with plan of care.   Vitals:    08/14/16 1737 08/14/16 2248 08/14/16 2300 08/15/16 0144   BP: 125/67 137/61  125/61   Pulse: 91 100  90   Resp: 18 (!) 27  20   Temp: 98.8 F (37.1 C) (!) 104.2 F (40.1 C) (!) 103.4 F (39.7 C) (!) 102 F (38.9 C)   TempSrc: Oral Axillary Rectal Axillary   SpO2: 94% 93%  97%   Weight:       Height:         0629: upon rounding on patient, pt is awake, following commands and knows he is in the hospital. Temp down to 97.5. Will continue with plan of care  Vitals:    08/15/16 0626   BP: 128/65   Pulse: 76   Resp: 20   Temp: 97.5 F (36.4 C)   SpO2: 100%

## 2016-08-15 NOTE — Progress Notes (Signed)
Infectious Disease            Progress Note    08/15/2016   Marco Bruce [id number] is a 83 y.o. male, history of hypertension, diabetes mellitus, gout, congestive heart failure, renal insufficiency, osteoarthritis, who was admitted with systemic inflammatory response syndrome/sepsis, congestive heart failure, and altered mental status.    Subjective:     [name] today Symptoms:  Afebrile at present, spiking high-grade fevers, still very weak and lethargic.  Other review of system is non contributory.    Objective:     Blood pressure 128/65, pulse 76, temperature 97.5 F (36.4 C), temperature source Axillary, resp. rate 20, height 1.626 m (5\' 4" ), weight 68 kg (150 lb), SpO2 100 %.    General Appearance: Sick-looking.   HEENT: Pallor positive, Anicteric sclera.   Neck: Supple  Lungs: Bilateral coarse breath sounds  Chest Wall: Symmetric chest wall expansion.   Heart : S1 and S2.   Abdomen: Abdomen is Soft, no significant tenderness  Neurological:  Very weak and lethargic; poorly responsive     Laboratory And Diagnostic Studies:     Recent Labs      08/15/16   0735  08/14/16   0252   WBC  13.59*  13.39*   Hgb  8.7*  8.3*   Hematocrit  25.8*  23.8*   Platelets  327  305   Neutrophils  76.5  76.0     Recent Labs      08/15/16   0735  08/14/16   0252  08/13/16   0115   Sodium  139  140  133*   Potassium  4.0  3.3*  4.5   Chloride  105  109  102   CO2  20*  22  20*   BUN   --   90.6*  100.7*   Creatinine  2.8*  2.9*  4.1*   Glucose   --   191*  246*   Calcium  8.3  8.2  8.7     Recent Labs      08/15/16   0735  08/13/16   0115  08/12/16   1842   AST (SGOT)  25  30  28    ALT   --   17  19   Alkaline Phosphatase   --   69  75   Protein, Total  5.2*  6.2  6.1   Albumin   --   3.3*  3.4*   Bilirubin, Total  0.6  0.5  0.5       Current Med's:     Current Facility-Administered Medications   Medication Dose Route Frequency   . aspirin EC  325 mg Oral Daily   . atorvastatin  40 mg Oral QPM   .  azithromycin  500 mg Intravenous Q24H [place]   . bisacodyl  10 mg Rectal Once   . enoxaparin  30 mg Subcutaneous Daily   . gabapentin  100 mg Oral TID   . NIFEdipine  30 mg Oral Daily   . pantoprazole  40 mg Oral QAM AC   . piperacillin-tazobactam  2.25 g Intravenous Q8H   . senna-docusate  2 tablet Oral BID       Assessment:      Condition: guarded   Systemic inflammatory response syndrome/sepsis   Altered mental status   Bronchitis   Acute on chronic renal failure   Pulmonary hypertension   Osteoarthritis   Gout   Diabetes mellitus   Hypertension  Congestive heart failure   Status post pacemaker placement    Plan:      Start Zosyn   Continue Zithromax   Discontinue Rocephin   CT abdomen with p.o. Contrast   Will follow, respiratory virus PCR   ANA with reflex   Will follow uric acid   Correction of electrolytes   Will follow Cultures   Continue supportive care   Discussed with family          Marco Bruce, M.D.,FACP  08/15/2016  8:39 AM          *This note was generated by the Epic EMR system/ Dragon speech recognition and may contain inherent errors or omissions not intended by the user. Grammatical errors, random word insertions, deletions, pronoun errors and incomplete sentences are occasional consequences of this technology due to software limitations. Not all errors are caught or corrected. If there are questions or concerns about the content of this note or information contained within the body of this dictation they should be addressed directly with the author for clarification

## 2016-08-16 LAB — COMPREHENSIVE METABOLIC PANEL
ALT: 22 U/L (ref 0–55)
AST (SGOT): 53 U/L — ABNORMAL HIGH (ref 5–34)
Albumin/Globulin Ratio: 0.9 (ref 0.9–2.2)
Albumin: 2.5 g/dL — ABNORMAL LOW (ref 3.5–5.0)
Alkaline Phosphatase: 91 U/L (ref 38–106)
Anion Gap: 15 (ref 5.0–15.0)
BUN: 92.9 mg/dL — ABNORMAL HIGH (ref 9.0–28.0)
Bilirubin, Total: 0.4 mg/dL (ref 0.2–1.2)
CO2: 18 mEq/L — ABNORMAL LOW (ref 22–29)
Calcium: 8.5 mg/dL (ref 7.9–10.2)
Chloride: 102 mEq/L (ref 100–111)
Creatinine: 2.6 mg/dL — ABNORMAL HIGH (ref 0.7–1.3)
Globulin: 2.7 g/dL (ref 2.0–3.6)
Glucose: 288 mg/dL — ABNORMAL HIGH (ref 70–100)
Potassium: 4.5 mEq/L (ref 3.5–5.1)
Protein, Total: 5.2 g/dL — ABNORMAL LOW (ref 6.0–8.3)
Sodium: 135 mEq/L — ABNORMAL LOW (ref 136–145)

## 2016-08-16 LAB — CBC AND DIFFERENTIAL
Absolute NRBC: 0.04 10*3/uL — ABNORMAL HIGH
Basophils Absolute Automated: 0.01 10*3/uL (ref 0.00–0.20)
Basophils Automated: 0.1 %
Eosinophils Absolute Automated: 0.01 10*3/uL (ref 0.00–0.70)
Eosinophils Automated: 0.1 %
Hematocrit: 25.5 % — ABNORMAL LOW (ref 42.0–52.0)
Hgb: 8.8 g/dL — ABNORMAL LOW (ref 13.0–17.0)
Immature Granulocytes Absolute: 0.1 10*3/uL — ABNORMAL HIGH
Immature Granulocytes: 1.1 %
Lymphocytes Absolute Automated: 0.83 10*3/uL (ref 0.50–4.40)
Lymphocytes Automated: 8.8 %
MCH: 30.7 pg (ref 28.0–32.0)
MCHC: 34.5 g/dL (ref 32.0–36.0)
MCV: 88.9 fL (ref 80.0–100.0)
MPV: 10.9 fL (ref 9.4–12.3)
Monocytes Absolute Automated: 0.47 10*3/uL (ref 0.00–1.20)
Monocytes: 5 %
Neutrophils Absolute: 8.01 10*3/uL (ref 1.80–8.10)
Neutrophils: 84.9 %
Nucleated RBC: 0.4 /100 WBC (ref 0.0–1.0)
Platelets: 326 10*3/uL (ref 140–400)
RBC: 2.87 10*6/uL — ABNORMAL LOW (ref 4.70–6.00)
RDW: 15 % (ref 12–15)
WBC: 9.43 10*3/uL (ref 3.50–10.80)

## 2016-08-16 LAB — GLUCOSE WHOLE BLOOD - POCT
Whole Blood Glucose POCT: 277 mg/dL — ABNORMAL HIGH (ref 70–100)
Whole Blood Glucose POCT: 289 mg/dL — ABNORMAL HIGH (ref 70–100)
Whole Blood Glucose POCT: 439 mg/dL — ABNORMAL HIGH (ref 70–100)
Whole Blood Glucose POCT: 391 mg/dL — ABNORMAL HIGH (ref 70–100)

## 2016-08-16 LAB — GFR: EGFR: 23.4

## 2016-08-16 MED ORDER — RISAQUAD PO CAPS
1.0000 | ORAL_CAPSULE | Freq: Every day | ORAL | Status: DC
Start: 2016-08-16 — End: 2016-08-17
  Administered 2016-08-16 – 2016-08-17 (×2): 1 via ORAL
  Filled 2016-08-16 (×2): qty 1

## 2016-08-16 MED ORDER — INSULIN LISPRO 100 UNIT/ML SC SOLN
14.0000 [IU] | Freq: Once | SUBCUTANEOUS | Status: AC
Start: 2016-08-16 — End: 2016-08-16
  Administered 2016-08-16: 18:00:00 14 [IU] via SUBCUTANEOUS
  Filled 2016-08-16: qty 42

## 2016-08-16 MED ORDER — DEXTROSE 50 % IV SOLN
12.5000 g | INTRAVENOUS | Status: DC | PRN
Start: 2016-08-16 — End: 2016-08-17

## 2016-08-16 MED ORDER — INSULIN LISPRO 100 UNIT/ML SC SOLN
2.0000 [IU] | Freq: Three times a day (TID) | SUBCUTANEOUS | Status: DC | PRN
Start: 2016-08-16 — End: 2016-08-17
  Administered 2016-08-17: 09:00:00 8 [IU] via SUBCUTANEOUS
  Administered 2016-08-17 (×2): 10 [IU] via SUBCUTANEOUS
  Filled 2016-08-16 (×2): qty 30
  Filled 2016-08-16: qty 24

## 2016-08-16 MED ORDER — GLUCAGON 1 MG IJ SOLR (WRAP)
1.0000 mg | INTRAMUSCULAR | Status: DC | PRN
Start: 2016-08-16 — End: 2016-08-17

## 2016-08-16 MED ORDER — GLUCOSE 40 % PO GEL
15.0000 g | ORAL | Status: DC | PRN
Start: 2016-08-16 — End: 2016-08-17

## 2016-08-16 MED ORDER — INSULIN LISPRO 100 UNIT/ML SC SOLN
1.0000 [IU] | Freq: Every evening | SUBCUTANEOUS | Status: DC | PRN
Start: 2016-08-16 — End: 2016-08-17
  Administered 2016-08-16: 22:00:00 6 [IU] via SUBCUTANEOUS
  Administered 2016-08-17: 03:00:00 5 [IU] via SUBCUTANEOUS
  Filled 2016-08-16: qty 15
  Filled 2016-08-16: qty 18

## 2016-08-16 NOTE — Plan of Care (Signed)
Problem: Safety  Goal: Patient will be free from injury during hospitalization  Outcome: Progressing   08/16/16 2232   Goal/Interventions addressed this shift   Patient will be free from injury during hospitalization  Assess patient's risk for falls and implement fall prevention plan of care per policy;Provide and maintain safe environment;Use appropriate transfer methods;Ensure appropriate safety devices are available at the bedside       Problem: Pain  Goal: Pain at adequate level as identified by patient  Outcome: Progressing   08/16/16 2232   Goal/Interventions addressed this shift   Pain at adequate level as identified by patient Identify patient comfort function goal;Assess for risk of opioid induced respiratory depression, including snoring/sleep apnea. Alert healthcare team of risk factors identified.;Assess pain on admission, during daily assessment and/or before any "as needed" intervention(s);Reassess pain within 30-60 minutes of any procedure/intervention, per Pain Assessment, Intervention, Reassessment (AIR) Cycle;Evaluate if patient comfort function goal is met       Problem: Compromised Tissue integrity  Goal: Damaged tissue is healing and protected  Outcome: Progressing   08/16/16 2232   Goal/Interventions addressed this shift   Damaged tissue is healing and protected  Monitor/assess Braden scale every shift;Provide wound care per wound care algorithm;Reposition patient every 2 hours and as needed unless able to reposition self

## 2016-08-16 NOTE — Progress Notes (Addendum)
Paged Dr. Raynald Blend for BG [phone]. Gave 14 units humalog per her order.

## 2016-08-16 NOTE — Progress Notes (Signed)
[facility]  Progress Note  Patient Info:   Date/Time: 08/16/2016 / 6:07 PM   Admit Date:08/12/2016  Patient Name:Marco Bruce   ATF:[id number]   PCP: Candelaria Celeste, MD  Attending Physician:Lucresha Dismuke, Stephenie [place], MD     Assessment and Plan:     83 year old with past medical history of diffuse osteoarthritis, diastolic heart failure, chronic kidney disease, admitted for worsening joint pain, now having fever     ID: fever/SIRS/: ?  Source of infection.  CT of abdomen pelvis did not show any acute pathology.  Empirically on Zithromax and Zosyn.  Pro-calcitonin elevated at 3.35   Chronic  diastolic heart failure/ severe MR on prior echo.   echo shows  dilated left ventricle with reduced ejection fraction of 40 percent, and moderate to severe MR.  Well compensated; no need for lasix now.     Acute renal failure on chronic kidney disease: creatinine improved; at baseline   Diffuse joint pain/osteoarthritis: uric acid 11.3; agree with low dose prednisone for possible acute gout; ordered for 3 days.  Await ANA level   Disposition    DVT Prohylaxis:lovenox   Central Line/Foley Catheter/PICC line status: none  Code Status: Full Code  Disposition:home  Type of Admission:Inpatient  Expected Date of Discharge: 2-3  Milestones required for discharge: see above  Hospital Problems:   Principal Problem:    Acute on chronic diastolic congestive heart failure  Active Problems:    NSTEMI (non-ST elevated myocardial infarction)    CHF (congestive heart failure)    Acute renal failure superimposed on stage 3 chronic kidney disease    Hyponatremia    Normocytic anemia    HTN (hypertension)    Diabetes mellitus, type II    OA (osteoarthritis)    Fever and chills    Subjective:   08/16/16 , fever, joint pain  Chief Complaint:  Leg Pain and Shoulder Pain    Review of Systems   Constitutional: Positive for fever and malaise/fatigue. Negative for chills.   Respiratory: Positive for shortness of breath. Negative for cough,  hemoptysis and sputum production.    Cardiovascular: Negative for chest pain, palpitations and orthopnea.   Gastrointestinal: Negative for abdominal pain, nausea and vomiting.   Musculoskeletal: Positive for joint pain.   Neurological: Positive for weakness.     Objective:     Vitals:    08/16/16 0530 08/16/16 0600 08/16/16 0949 08/16/16 1327   BP: 135/69  130/64 136/62   Pulse: 71  69 75   Resp: 16  16 20    Temp: 97.3 F (36.3 C)  97.3 F (36.3 C) 97.7 F (36.5 C)   TempSrc:   Temporal Artery Temporal Artery   SpO2: 99%  100% 95%   Weight:  68.9 kg (152 lb)     Height:         Physical Exam:   Physical Exam   Constitutional: He is well-developed, well-nourished, and in no distress.   Neck: Normal range of motion. Neck supple.   Cardiovascular: Normal rate and regular rhythm.    Pulmonary/Chest: Effort normal and breath sounds normal. No respiratory distress. He has no wheezes.   Abdominal: Soft. Bowel sounds are normal. He exhibits no distension. There is no tenderness. There is no rebound.   Musculoskeletal: He exhibits tenderness. He exhibits no edema.   c/o of diffuse joint pain during passive ROM     Results of Labs/imaging   Labs and radiology reports have been reviewed.    Hospitalist  Signed by:   Carney Bern  08/16/2016 6:07 PM    *This note was generated by the Epic EMR system/ Dragon speech recognition and may contain inherent errors or omissions not intended by the user. Grammatical errors, random word insertions, deletions, pronoun errors and incomplete sentences are occasional consequences of this technology due to software limitations. Not all errors are caught or corrected. If there are questions or concerns about the content of this note or information contained within the body of this dictation they should be addressed directly with the author for clarification

## 2016-08-16 NOTE — Plan of Care (Signed)
Problem: Pain  Goal: Pain at adequate level as identified by patient  Outcome: Progressing   08/16/16 1409   Goal/Interventions addressed this shift   Pain at adequate level as identified by patient Identify patient comfort function goal;Assess for risk of opioid induced respiratory depression, including snoring/sleep apnea. Alert healthcare team of risk factors identified.;Assess pain on admission, during daily assessment and/or before any "as needed" intervention(s);Reassess pain within 30-60 minutes of any procedure/intervention, per Pain Assessment, Intervention, Reassessment (AIR) Cycle;Evaluate if patient comfort function goal is met;Evaluate patient's satisfaction with pain management progress   Pt resting comfortably throughout shift. Pt's grandson feels he is feeling much better. Expresses some discomfort when turned.     Problem: Compromised Tissue integrity  Goal: Damaged tissue is healing and protected  Outcome: Progressing   08/16/16 1409   Goal/Interventions addressed this shift   Damaged tissue is healing and protected  Monitor/assess Braden scale every shift;Provide wound care per wound care algorithm;Reposition patient every 2 hours and as needed unless able to reposition self;Increase activity as tolerated/progressive mobility;Relieve pressure to bony prominences for patients at moderate and high risk;Avoid shearing injuries;Keep intact skin clean and dry;Use bath wipes, not soap and water, for daily bathing;Use incontinence wipes for cleaning urine, stool and caustic drainage. Foley care as needed;Consult/collaborate with wound care nurse   Turning q 2 hr. Hip propped up on pillow. Pt has stage 2 to sacrum, cleansed and covered with mepilex. Discussed with Nolon Rod who will come evaluate.

## 2016-08-16 NOTE — Progress Notes (Signed)
Infectious Disease            Progress Note    08/16/2016   Marco Bruce [id number]:[id number],[id number] is a 83 y.o. male, history of hypertension, diabetes mellitus, gout, congestive heart failure, renal insufficiency, osteoarthritis, who was admitted with systemic inflammatory response syndrome/sepsis, congestive heart failure, and altered mental status.    Subjective:     [name] today Symptoms:  Afebrile. Still weak and lethargic. Denies any significant diarrhea.   Other review of system is non contributory.    Objective:     Blood pressure 130/64, pulse 69, temperature 97.3 F (36.3 C), resp. rate 16, height 1.626 m (5\' 4" ), weight 68.9 kg (152 lb), SpO2 100 %.    General Appearance: Weak and lethargic   HEENT: Pallor positive, Anicteric sclera.   Neck: Supple  Lungs: Bilateral coarse breath sounds  Chest Wall: Symmetric chest wall expansion.   Heart : S1 and S2.   Abdomen: Abdomen is Soft, no significant tenderness  Neurological:  Very weak and lethargic    Laboratory And Diagnostic Studies:     Recent Labs      08/16/16   0623  08/15/16   0735   WBC  9.43  13.59*   Hgb  8.8*  8.7*   Hematocrit  25.5*  25.8*   Platelets  326  327   Neutrophils  84.9  76.5     Recent Labs      08/16/16   0623  08/15/16   0735   Sodium  135*  139   Potassium  4.5  4.0   Chloride  102  105   CO2  18*  20*   BUN  92.9*  84.1*   Creatinine  2.6*  2.8*   Glucose  288*  345*   Calcium  8.5  8.3     Recent Labs      08/16/16   0623  08/15/16   0735   AST (SGOT)  53*  25   ALT  22  13   Alkaline Phosphatase  91  83   Protein, Total  5.2*  5.2*   Albumin  2.5*  2.6*   Bilirubin, Total  0.4  0.6       Current Med's:     Current Facility-Administered Medications   Medication Dose Route Frequency   . allopurinol  100 mg Oral Daily   . aspirin EC  325 mg Oral Daily   . atorvastatin  40 mg Oral QPM   . azithromycin  500 mg Intravenous Q24H [place]   . bisacodyl  10 mg Rectal Once   . enoxaparin  30 mg Subcutaneous Daily   . gabapentin   100 mg Oral TID   . iron sucrose  200 mg Intravenous Q24H [place]   . lactobacillus/streptococcus  1 capsule Oral Daily   . NIFEdipine  30 mg Oral Daily   . pantoprazole  40 mg Oral QAM AC   . piperacillin-tazobactam  2.25 g Intravenous Q8H   . predniSONE  20 mg Oral QAM W/BREAKFAST   . senna-docusate  2 tablet Oral BID       Assessment:      Condition: guarded   Systemic inflammatory response syndrome/sepsis   Altered mental status   Bronchitis   Acute on chronic renal failure   Pulmonary hypertension   Osteoarthritis   Gout/Myositis?   Diabetes mellitus   Hypertension   Congestive heart failure   Status post pacemaker placement  Plan:      Continue Zosyn   Continue Zithromax   Continue steroids   ANA with reflex   Correction of electrolytes   Continue supportive care   Discussed with family          Serafina Royals, M.D.,FACP  08/16/2016  9:53 AM          *This note was generated by the Epic EMR system/ Dragon speech recognition and may contain inherent errors or omissions not intended by the user. Grammatical errors, random word insertions, deletions, pronoun errors and incomplete sentences are occasional consequences of this technology due to software limitations. Not all errors are caught or corrected. If there are questions or concerns about the content of this note or information contained within the body of this dictation they should be addressed directly with the author for clarification

## 2016-08-16 NOTE — OT Progress Note (Signed)
Occupational Therapy Note    [facility]  [place], [place]  [phone]    Occupational Therapy Treatment Note       Patient:  Marco Bruce MRN#:  [id number]  [place] [id number]    Time of treatment: Start Time: 1606 Stop Time: 1629   Time Calculation (min): 23 min         Precautions and Contraindications:    Precautions  Weight Bearing Status: no restrictions  Other Precautions: falls    Assessment:   Assessment: decreased ROM;decreased strength;balance deficits;decreased independence with ADLs;decreased safety awareness;decreased endurance/activity tolerance  Prognosis: Good;With continued OT s/p acute discharge  Progress: Slow progress, decreased activity tolerance           Plan: Continue with Occupational therapy services in acute care to address deficits with ADLs, transfers and functional mobility Focus next therapy session on LB dressing w/AE, bed mobility, transfers, UE therex.      OT Plan  Risks/Benefits/POC Discussed with Pt/Family: With patient/family (pt's grandson)  Treatment Interventions: ADL retraining;Functional transfer training;UE strengthening/ROM;Endurance training;Patient/Family training;Equipment eval/education;Neuro muscular reeducation;Fine motor coordination activities;Compensatory technique education;Cognitive reorientation  Discharge Recommendation: SNF (however pt/pt's family wants to be d/c home w/Home OT)  DME Recommended for Discharge: Front wheel walker;Reacher;Sock aid  OT Frequency Recommended: 3-4x/wk  OT - Next Visit Recommendation: 08/18/16      Neuro Re-Ed Goals  Pt will sit at edge of bed: Goal met (sat at EOB x 32mins w/SUP)                      Based on today's session patient's discharge recommendation is the following: Discharge Recommendation: SNF (however pt/pt's family wants to be d/c home w/Home OT).     If Discharge Recommendation: SNF (however pt/pt's family wants to be  d/c home w/Home OT) is not available, then the patient will need home health services, increase supervision , 24/7 supervision, assistance with mobility and assistance with ADL's.  DME Recommended for Discharge: Front wheel walker;Reacher;Sock aid            Subjective:   .    Patient's medical condition is appropriate for Occupational Therapy intervention at this time.  Patient is agreeable to participation in the therapy session. Family and/or guardian are agreeable to patient's participation in the therapy session. Patient is unable to indicate agreement for the therapy session but is able to participate in the selected activities. Pt and pt's family declined interpreter services, pt's grandson was the interpreter for tx.   Pain Assessment  Pain Assessment: Numeric Scale (0-10)  Pain Score: 10-severe pain  POSS Score: Awake and Alert  Pain Location: Leg  Pain Orientation: Right;Left;Upper;Lower. It appears that pt's R leg is more painful than L as pt moaned and groaned with R LE mov't but not w/L LE mov't  Pain Descriptors: Aching  Pain Frequency: Constant/continuous;Increases with movement  Effect of Pain on Daily Activities: severe  Pain Intervention(s): Repositioned;Distraction;Rest (RN notified and provided pt with pain meds)    Objective:  Observation of Patient/Vital Signs:  Patient is in bed with telemetry and peripheral IV in place.    Cognition/Neuro Status  Arousal/Alertness: Delayed responses to stimuli  Attention Span: Attends to task with redirection  Orientation Level: Oriented to place;Oriented to person  Memory: Decreased long term memory;Decreased short term memory  Following Commands: Follows one step commands with increased time  Safety Awareness: moderate verbal instruction  Insights: Fully aware of deficits;Educated in safety awareness (pt's fear impedes his safety)  Problem Solving: moderate assistance  Behavior: fearful;cooperative  Motor Planning: decreased initiation  Coordination: [place]  impaired;FMC impaired  Hand Dominance: right handed    Functional Mobility  Supine to Sit Transfers: Maximal Assist;additional time used bedrail  Sit to Stand Transfers: Maximal Assist  Stand to Sit Transfers: Maximal Assist  Bed to Chair Transfers: Dependent (stand pivot)  Chair Transfers: Dependent;additional time    Self-care and Home Management  Grooming: Supervision;in chair;setup;wash/dry hands  LB Dressing: Dependent;in bed;Don/doff R sock;Don/doff L sock (donning socks only)  Functional Transfers: Dependent;increased time to complete (to chair stand pivot; attempted w/RW however pt unable to )    Therapeutic Exercises AROM 10x1 set   Shoulder AROM: Flexion;Abduction;Sitting;Supine (in supine to INC circulation for movement, while seating at EOB perform b/l shoulder horizontal abd/adduction to INC UE endurance and flexibility for standing and transfer  Elbow AROM: Flexion;Extension in supine in prep for OOB activity  Hand ROM: digit extension;digit ABduction;digit ADDuction;thumb flexion;MP flexion;finger opposition, seated in prep for standing/transfers    Neuro Re-Ed  Balance Training: Sitting reaching activities w/Sup to CGA pt with limited trunk ROM due to pain and fear of falling    Therapeutic Exercise: Flexibility/ROM (b/l LE AROM in supine and sitting at EOB to INC circulation) 10x 3 sets of ankle pumps, 10x1 set of AAROM and AROM knee flex/ext, 10x1 set of AAROM and AROM of hip flex/ext; supine in bed     Treatment Activities:   Pt continues to be limited by pain in b/l legs, dec LE/UE ROM, strength endurance. Pt also has fear of falling which impedes his safety and requires mod verbal prompting for safety tech, awareness; verbal prompting for hand placement, foot placement, problem solving.  Pt unable to safely use RW for transfer and required Total A to perform a stand pivot transfer bed to recliner chair leading w/L side. Pt also demo fear of movement due to pain however tolerated UE/LE therex  well. Educated the patient to role of occupational therapy, plan of care, goals of therapy and HEP, safety with mobility and ADLs, pursed lip breathing, home safety.  Patient left without needs and call bell within reach. Pt's grandson at beside  RN notified of session outcome.  Colston Pyle, OTR/L

## 2016-08-16 NOTE — Progress Notes (Signed)
[facility].  Progress Note    Assessment:   AKI on CKD IV   DM   CHF   Sepsis   Hypertension   Anemia of CKD   Gout   Valvular heart disease    Plan:   Antibiotics per ID   IV Venofer   No need for diuresis    Queen Slough, MD  Office - [phone]  ++++++++++++++++++++++++++++++++++++++++++++++++++++++++++++++  Subjective:  No new complaints    Medications:  Scheduled Meds:  Current Facility-Administered Medications   Medication Dose Route Frequency   . allopurinol  100 mg Oral Daily   . aspirin EC  325 mg Oral Daily   . atorvastatin  40 mg Oral QPM   . azithromycin  500 mg Intravenous Q24H [place]   . bisacodyl  10 mg Rectal Once   . enoxaparin  30 mg Subcutaneous Daily   . gabapentin  100 mg Oral TID   . iron sucrose  200 mg Intravenous Q24H [place]   . lactobacillus/streptococcus  1 capsule Oral Daily   . NIFEdipine  30 mg Oral Daily   . pantoprazole  40 mg Oral QAM AC   . piperacillin-tazobactam  2.25 g Intravenous Q8H   . predniSONE  20 mg Oral QAM W/BREAKFAST   . senna-docusate  2 tablet Oral BID     Continuous Infusions:  PRN Meds:acetaminophen, acetaminophen, cyclobenzaprine, Nursing communication: Adult Hypoglycemia Treatment Algorithm **AND** dextrose **AND** dextrose **AND** glucagon (rDNA), insulin lispro, insulin lispro, morphine, ondansetron, oxyCODONE    Objective:  Vital signs in last 24 hours:  Temp:  [97.2 F (36.2 C)-97.3 F (36.3 C)] 97.3 F (36.3 C)  Heart Rate:  [68-74] 69  Resp Rate:  [16-20] 16  BP: (113-135)/(64-70) 130/64    Intake/Output from yesterday (07:01 - 07:00):  05/13 0701 - 05/14 0700  In: 615 [P.O.:330; I.V.:439]  Out: -      Physical Exam:   Gen: WD WN NAD   CV: S1 S2 N RRR   Chest: CTAB   Ab: ND NT soft no HSM +BS   Ext: No C/E    Labs:    Recent Labs  Lab 08/16/16  0623 08/15/16  0735 08/14/16  0252 08/13/16  0115   Glucose 288* 345* 191* 246*   BUN 92.9* 84.1* 90.6* 100.7*   Creatinine 2.6* 2.8* 2.9* 4.1*   Calcium 8.5 8.3 8.2 8.7    Sodium 135* 139 140 133*   Potassium 4.5 4.0 3.3* 4.5   Chloride 102 105 109 102   CO2 18* 20* 22 20*   Albumin 2.5* 2.6*  --  3.3*   Phosphorus  --   --  3.5 4.3   Magnesium  --   --  2.3 2.7*       Recent Labs  Lab 08/16/16  0623 08/15/16  0735 08/14/16  0252   WBC 9.43 13.59* 13.39*   Hgb 8.8* 8.7* 8.3*   Hematocrit 25.5* 25.8* 23.8*   MCV 88.9 90.5 89.5   MCH 30.7 30.5 31.2   MCHC 34.5 33.7 34.9   RDW 15 15 15    MPV 10.9 11.0 11.3   Platelets [phone]

## 2016-08-16 NOTE — Consults (Signed)
[facility] CONSULT NOTE      Reason for Consult:   Sacral pressure injury    Past Medical History  Past Medical History:   Diagnosis Date   . Congestive heart failure (CHF)    . Diabetes mellitus    . Gout     chronic   . Hypertension      Past Surgical History  Past Surgical History:   Procedure Laterality Date   . ABDOMINAL SURGERY       Assessment:  83 year old male admitted with acute on chronic CHF  Medical and surgical history noted above  Decreased mobility  Incontinent  On Versacare bed for pressure redistribution  TAP system in place under patient for turn/position assist  Heels floated on pillows  Sacrum: Stage 2 pressure injury 0.5 cm x 0.3 cm x 0.1 cm with clean pink base, no periwound erythema, no active drainage    Intervention:  Foam dressing applied to sacrum    Plan:  Foam dressing to sacrum Q 72 hours and PRN  Maintain skin and pressure injury prevention measures  Routine incontinence care with barrier wipes, barrier cream, and absorbent pads      Rita Ohara BSN, RN, [employer], [facility]  [facility] Nurse Coordinator  [employer] [phone]

## 2016-08-16 NOTE — Plan of Care (Signed)
Problem: Safety  Goal: Patient will be free from injury during hospitalization  Outcome: Progressing   08/16/16 0021   Goal/Interventions addressed this shift   Patient will be free from injury during hospitalization  Assess patient's risk for falls and implement fall prevention plan of care per policy;Provide and maintain safe environment;Use appropriate transfer methods;Ensure appropriate safety devices are available at the bedside;Include patient/ family/ care giver in decisions related to safety;Hourly rounding;Assess for patients risk for elopement and implement [place] per policy;Provide alternative method of communication if needed (communication boards, writing)       Comments:   Patient is alert to self and place only. Pt does have pain with movement. Continues on scheduled Neurontin. Assisted with turning and repositioning. Incontinent care provided. Call bell within reach. Will continue with plan of care.   Vitals:    08/15/16 2212   BP: 131/70   Pulse: 71   Resp: 16   Temp: 97.2 F (36.2 C)   SpO2: 97%

## 2016-08-17 LAB — CBC
Absolute NRBC: 0.08 10*3/uL — ABNORMAL HIGH
Hematocrit: 26 % — ABNORMAL LOW (ref 42.0–52.0)
Hgb: 9 g/dL — ABNORMAL LOW (ref 13.0–17.0)
MCH: 30.5 pg (ref 28.0–32.0)
MCHC: 34.6 g/dL (ref 32.0–36.0)
MCV: 88.1 fL (ref 80.0–100.0)
MPV: 11.5 fL (ref 9.4–12.3)
Nucleated RBC: 0.8 /100 WBC (ref 0.0–1.0)
Platelets: 382 10*3/uL (ref 140–400)
RBC: 2.95 10*6/uL — ABNORMAL LOW (ref 4.70–6.00)
RDW: 15 % (ref 12–15)
WBC: 10.02 10*3/uL (ref 3.50–10.80)

## 2016-08-17 LAB — BASIC METABOLIC PANEL
Anion Gap: 13 (ref 5.0–15.0)
BUN: 102.4 mg/dL — ABNORMAL HIGH (ref 9.0–28.0)
CO2: 17 mEq/L — ABNORMAL LOW (ref 22–29)
Calcium: 8.6 mg/dL (ref 7.9–10.2)
Chloride: 102 mEq/L (ref 100–111)
Creatinine: 2.7 mg/dL — ABNORMAL HIGH (ref 0.7–1.3)
Glucose: 353 mg/dL — ABNORMAL HIGH (ref 70–100)
Potassium: 4.3 mEq/L (ref 3.5–5.1)
Sodium: 132 mEq/L — ABNORMAL LOW (ref 136–145)

## 2016-08-17 LAB — GLUCOSE WHOLE BLOOD - POCT
Whole Blood Glucose POCT: 322 mg/dL — ABNORMAL HIGH (ref 70–100)
Whole Blood Glucose POCT: 315 mg/dL — ABNORMAL HIGH (ref 70–100)
Whole Blood Glucose POCT: 456 mg/dL — ABNORMAL HIGH (ref 70–100)
Whole Blood Glucose POCT: 407 mg/dL — ABNORMAL HIGH (ref 70–100)

## 2016-08-17 LAB — GFR: EGFR: 22.4

## 2016-08-17 MED ORDER — AMOXICILLIN-POT CLAVULANATE 500-125 MG PO TABS
1.0000 | ORAL_TABLET | Freq: Two times a day (BID) | ORAL | Status: DC
Start: 2016-08-17 — End: 2016-08-17
  Administered 2016-08-17: 11:00:00 1 via ORAL
  Filled 2016-08-17 (×2): qty 1

## 2016-08-17 MED ORDER — SODIUM BICARBONATE 650 MG PO TABS
1300.0000 mg | ORAL_TABLET | Freq: Two times a day (BID) | ORAL | 0 refills | Status: DC
Start: 2016-08-17 — End: 2016-10-01

## 2016-08-17 MED ORDER — PREDNISONE 20 MG PO TABS
20.0000 mg | ORAL_TABLET | Freq: Every day | ORAL | 0 refills | Status: DC
Start: 2016-08-17 — End: 2016-08-23

## 2016-08-17 MED ORDER — RISAQUAD PO CAPS
1.0000 | ORAL_CAPSULE | Freq: Every day | ORAL | 0 refills | Status: DC
Start: 2016-08-18 — End: 2017-04-29

## 2016-08-17 MED ORDER — AMOXICILLIN-POT CLAVULANATE 500-125 MG PO TABS
1.0000 | ORAL_TABLET | Freq: Two times a day (BID) | ORAL | 0 refills | Status: DC
Start: 2016-08-17 — End: 2016-08-23

## 2016-08-17 MED ORDER — SODIUM BICARBONATE 650 MG PO TABS
1300.0000 mg | ORAL_TABLET | Freq: Two times a day (BID) | ORAL | Status: DC
Start: 2016-08-17 — End: 2016-08-17
  Administered 2016-08-17: 17:00:00 1300 mg via ORAL
  Filled 2016-08-17: qty 2

## 2016-08-17 MED ORDER — AMOXICILLIN-POT CLAVULANATE 500-125 MG PO TABS
1.0000 | ORAL_TABLET | Freq: Every day | ORAL | Status: DC
Start: 2016-08-17 — End: 2016-08-17

## 2016-08-17 MED ORDER — ALLOPURINOL 100 MG PO TABS
100.0000 mg | ORAL_TABLET | Freq: Every day | ORAL | 0 refills | Status: DC
Start: 2016-08-18 — End: 2016-10-01

## 2016-08-17 MED ORDER — INSULIN GLARGINE 100 UNIT/ML SC SOLN
8.0000 [IU] | Freq: Every evening | SUBCUTANEOUS | Status: DC
Start: 2016-08-17 — End: 2016-08-17

## 2016-08-17 NOTE — Progress Notes (Signed)
08/17/16 1247   Provider Notification   Reason for Communication Other (Comment)   Time of Critical Value Notification 1240   Critical Lab POCT   Critical Lab Value BS 456   Provider Name Dr Karlyne Greenspan   Provider Role Hospitalist   Method of Communication Face to face   Readback Results No   Response No new orders

## 2016-08-17 NOTE — Progress Notes (Signed)
[facility].  Progress Note    Assessment:   AKI - prerenal due to volume depletion,renal sonogram with right renal atrophy,no hydronephrosis and UA normal   Anemia with severe Iron XKG:YJEH 6%   CKD stage IIIb- Etiology HTN- baseline Cr 2-2.3 since 2017   Fever   Mild hyponatremia   Diastolic CHF   Severe valve disease- Severe MR, Severe TR, Mild AS   DM2   HTN    Plan:  -Renal fx improved . May have reached at a new baseline  -Added oral sodium bicarbonate 1300 mg PO BID  -Low dose steroids for 3 days  -IV iron  -daily labs    Dayna Barker, MD  Office [phone]  ++++++++++++++++++++++++++++++++++++++++++++++++++++++++++++++  Subjective:  No new complaints      Medications:  Scheduled Meds:  Current Facility-Administered Medications   Medication Dose Route Frequency   . allopurinol  100 mg Oral Daily   . amoxicillin-clavulanate  1 tablet Oral Q12H   . aspirin EC  325 mg Oral Daily   . atorvastatin  40 mg Oral QPM   . azithromycin  500 mg Intravenous Q24H [place]   . bisacodyl  10 mg Rectal Once   . enoxaparin  30 mg Subcutaneous Daily   . gabapentin  100 mg Oral TID   . insulin glargine  8 Units Subcutaneous QHS   . iron sucrose  200 mg Intravenous Q24H [place]   . lactobacillus/streptococcus  1 capsule Oral Daily   . NIFEdipine  30 mg Oral Daily   . pantoprazole  40 mg Oral QAM AC   . senna-docusate  2 tablet Oral BID     Continuous Infusions:  PRN Meds:acetaminophen, acetaminophen, cyclobenzaprine, Nursing communication: Adult Hypoglycemia Treatment Algorithm **AND** dextrose **AND** dextrose **AND** glucagon (rDNA), Nursing communication: Adult Hypoglycemia Treatment Algorithm **AND** dextrose **AND** dextrose **AND** glucagon (rDNA), insulin lispro, insulin lispro, ondansetron, oxyCODONE    Objective:  Vital signs in last 24 hours:  Temp:  [97 F (36.1 C)-97.7 F (36.5 C)] 97.5 F (36.4 C)  Heart Rate:  [65-83] 65  Resp Rate:  [16-20] 16  BP: (129-145)/(62-72)  145/65  Intake/Output last 24 hours:    Intake/Output Summary (Last 24 hours) at 08/17/16 1254  Last data filed at 08/17/16 1057   Gross per 24 hour   Intake             1336 ml   Output                0 ml   Net             1336 ml     Intake/Output this shift:  I/O this shift:  In: 240 [P.O.:240]  Out: -     Physical Exam:   Gen: WD WN NAD   CV: S1 S2 N RRR   Chest: CTAB   Ab: ND NT soft no HSM +BS   Ext: No C/E    Labs:    Recent Labs  Lab 08/17/16  0604 08/16/16  0623 08/15/16  0735 08/14/16  0252 08/13/16  0115   Glucose 353* 288* 345* 191* 246*   BUN 102.4* 92.9* 84.1* 90.6* 100.7*   Creatinine 2.7* 2.6* 2.8* 2.9* 4.1*   Calcium 8.6 8.5 8.3 8.2 8.7   Sodium 132* 135* 139 140 133*   Potassium 4.3 4.5 4.0 3.3* 4.5   Chloride 102 102 105 109 102   CO2 17* 18* 20* 22 20*  Albumin  --  2.5* 2.6*  --  3.3*   Phosphorus  --   --   --  3.5 4.3   Magnesium  --   --   --  2.3 2.7*       Recent Labs  Lab 08/17/16  0604 08/16/16  0623 08/15/16  0735   WBC 10.02 9.43 13.59*   Hgb 9.0* 8.8* 8.7*   Hematocrit 26.0* 25.5* 25.8*   MCV 88.1 88.9 90.5   MCH 30.5 30.7 30.5   MCHC 34.6 34.5 33.7   RDW 15 15 15    MPV 11.5 10.9 11.0   Platelets [phone]

## 2016-08-17 NOTE — Progress Notes (Signed)
Assessment done this morning, Pt denies pain or acute distress.POC and safety plan reviewed with pt's family, Pt turned and repositioned, Pt resting in bed, call bell within reach. Will continue to monitor.

## 2016-08-17 NOTE — Discharge Summary (Addendum)
[facility] HOSPITALIST   [place] Summary   Patient Info:   Date/Time: 08/17/2016 / 2:28 PM   Admit Date:08/12/2016  Patient Name:Marco Bruce   IDC:[id number]   PCP: Candelaria Celeste, MD  Attending Physician:Srihan Brutus, Marquis Buggy, MD     Hospital Course:   Please see H&P for complete details of HPI and ROS. The patient was admitted to [facility] and has been diagnosed with Hospital Problems:  Principal Problem:    Acute on chronic diastolic congestive heart failure  Active Problems:      CHF (congestive heart failure)    Acute renal failure superimposed on stage 3 chronic kidney disease    Hyponatremia    Normocytic anemia    HTN (hypertension)    Diabetes mellitus, type II    OA (osteoarthritis)    Fever and chills   and has been taken care as mentioned below.    83 year old with past medical history of diffuse osteoarthritis, diastolic heart failure, chronic kidney disease, admitted for worsening joint pain, and developed fever.     Addendum . Upon review of the whole hospital visit, vitals and labs, it looks like patient presented with sepsis picture due to acute bronchitis.     # SIRS/sepsis due to acute bronchitis    procalcitonin  3.35  Received zosyn and zithromax , we will discharge on augmentin for 5 more days.     # Elevated troponins   As per cardio , Type II MI. Flat curve troponin with no anginal symptoms    # Chronic  diastolic heart failure/ severe MR on prior echo.  Echo shows  dilated left ventricle with reduced ejection fraction of 40 percent, and moderate to severe MR.  Well compensated; no need for lasix now.     # Acute renal failure on chronic kidney disease: creatinine improved; at baseline  Oral bicarb recommended by renal     # DM   Resume home dose medications    # Diffuse joint pain/osteoarthritis: uric acid 11.3;    low dose prednisone for possible acute gout received for 3 days.     PT recommended to be discharged to SNF, but family prefers to have patient discharged home.      Disposition:home  Condition at Discharge and Prognosis: stable   Admission Date:08/12/2016  Discharge Date: 08/17/16  Type of Admission:Inpatient  Medical Necessity for stay:SIRS   Code Status: Full Code  Subjective at the time of discharge:   Patient is hemodynamically stable to be discharged.     Chief Complaint:  Leg Pain and Shoulder Pain    Objective:     Vitals:    08/16/16 2032 08/17/16 0551 08/17/16 0932 08/17/16 1300   BP: 129/68 142/72 145/65 137/75   Pulse: 69 76 65    Resp: 18 16 16 16    Temp: 97.3 F (36.3 C) 97 F (36.1 C) 97.5 F (36.4 C) 97.9 F (36.6 C)   TempSrc: Oral  Oral Oral   SpO2: 98% 99% 96% 97%   Weight:       Height:         Physical Exam:   Physical Exam   Constitutional: He appears well-developed and well-nourished.   HENT:   Head: Normocephalic and atraumatic.   Eyes: Conjunctivae are normal.   Neck: Neck supple. No JVD present.   Cardiovascular: Normal rate, regular rhythm, normal heart sounds and intact distal pulses.    Pulmonary/Chest: Effort normal and breath sounds normal. No respiratory distress.  He has no wheezes. He has no rales.   Abdominal: Soft. Bowel sounds are normal. He exhibits no distension. There is no tenderness. There is no rebound.   Musculoskeletal: He exhibits no edema or tenderness.   Neurological: He is alert.   Skin: Skin is warm and dry.   Psychiatric: He has a normal mood and affect. His behavior is normal.   Vitals reviewed.      Clinical Presentation:   History of Presenting Illness: Please refer to HPI in the Detailed H&P  Discharge Medications:   Discharge Medications:     Discharge Medication List      Taking    allopurinol 100 MG tablet  Dose:  100 mg  Commonly known as:  ZYLOPRIM  Start taking on:  08/18/2016  Take 1 tablet (100 mg total) by mouth daily.     amoxicillin-clavulanate 500-125 MG per tablet  Dose:  1 tablet  Commonly known as:  AUGMENTIN  Take 1 tablet by mouth every 12 (twelve) hours.     atorvastatin 40 MG tablet  Dose:  40  mg  Commonly known as:  LIPITOR  Take 1 tablet (40 mg total) by mouth every evening.     chlorthalidone 50 MG tablet  Commonly known as:  HYGROTEN  TAKE ONE TABLET BY MOUTH EVERY DAY IN THE MORNING WITH FOOD     cyclobenzaprine 10 MG tablet  Dose:  10 mg  Commonly known as:  FLEXERIL  Take 1 tablet (10 mg total) by mouth every 8 (eight) hours as needed for Muscle spasms.     docusate sodium 100 MG capsule  Dose:  100 mg  Commonly known as:  COLACE  Take 100 mg by mouth 2 (two) times daily.     gabapentin 100 MG capsule  Commonly known as:  NEURONTIN  TAKE ONE CAPSULE BY MOUTH EVERY 8 HOURS     glucose blood test strip  Dose:  1 each  Commonly known as:  TRUE METRIX BLOOD GLUCOSE TEST  1 each by Other route 2 (two) times daily. Use as instructed     insulin glargine 100 UNIT/ML injection pen  Dose:  8 Units  Inject 8 Units into the skin nightly.     Insulin Pen Needle 31G X 5 MM Misc  INJECT INSULIN DAILY AS DIRECTED     lactobacillus/streptococcus Caps  Dose:  1 capsule  Start taking on:  08/18/2016  Take 1 capsule by mouth daily.     lidocaine 5 %  Dose:  1 patch  Commonly known as:  LIDODERM  Place 1 patch onto the skin daily.Remove & Discard patch within 12 hours or as directed by MD     Linagliptin 5 MG Tabs  Dose:  5 mg  Take 5 mg by mouth.     NIFEdipine 60 MG 24 hr tablet  Commonly known as:  PROCARDIA XL  TAKE 2 TABLETS BY MOUTH DAILY     pantoprazole 40 MG tablet  Commonly known as:  PROTONIX  TAKE ONE TABLET BY MOUTH EVERY DAY          Follow up recommendations:   Follow up:   Follow-up Information     [facility] Follow up in 2 day(s).    Why:  Home Health visits  Contact information:  [place] [address]  [phone]           Candelaria Celeste, MD Follow up.    Specialty:  Internal Medicine  Contact information:  [address]  [place] [address]  [phone]                  Results of Labs/imaging:   Labs have been reviewed:   Coagulation Profile:   Recent Labs  Lab  08/13/16  0115   PT 13.3   PT INR 1.0   PTT 26       CBC review:   Recent Labs  Lab 08/17/16  0604 08/16/16  0623 08/15/16  0735 08/14/16  0252 08/13/16  0115 08/12/16  1842   WBC 10.02 9.43 13.59* 13.39* 11.90* 11.23*   Hgb 9.0* 8.8* 8.7* 8.3* 9.4* 9.8*   Hematocrit 26.0* 25.5* 25.8* 23.8* 26.1* 27.2*   Platelets 382 326 327 305 251 262   MCV 88.1 88.9 90.5 89.5 86.4 86.6   RDW 15 15 15 15 14 14    Neutrophils  --  84.9 76.5 76.0 77.0 70.8   Lymphocytes Automated  --  8.8 7.2 7.2 9.0 12.9   Eosinophils Automated  --  0.1 1.4 6.0 5.5 7.7   Immature Granulocyte  --  1.1 3.4 0.6 0.9 0.9   Neutrophils Absolute  --  8.01 10.40* 10.18* 9.16* 7.96   Absolute Immature Granulocyte  --  0.10* 0.46* 0.08* 0.11* 0.10*     Chem Review:  Recent Labs  Lab 08/17/16  0604 08/16/16  0623 08/15/16  0735 08/14/16  0252 08/13/16  0115 08/12/16  1842   Sodium 132* 135* 139 140 133*  --    Potassium 4.3 4.5 4.0 3.3* 4.5  --    Chloride 102 102 105 109 102  --    CO2 17* 18* 20* 22 20*  --    BUN 102.4* 92.9* 84.1* 90.6* 100.7*  --    Creatinine 2.7* 2.6* 2.8* 2.9* 4.1*  --    i-STAT Creatinine  --   --   --   --   --  5.30*   Glucose 353* 288* 345* 191* 246*  --    Calcium 8.6 8.5 8.3 8.2 8.7  --    Magnesium  --   --   --  2.3 2.7*  --    Phosphorus  --   --   --  3.5 4.3  --    Bilirubin, Total  --  0.4 0.6  --  0.5 0.5   AST (SGOT)  --  53* 25  --  30 28   ALT  --  22 13  --  17 19   Alkaline Phosphatase  --  91 83  --  69 75     Results     Procedure Component Value Units Date/Time    Glucose Whole Blood - POCT [[id number]]  (Abnormal) Collected:  08/17/16 1120     Updated:  08/17/16 1129     POCT - Glucose Whole blood 456 (H) mg/dL     Glucose Whole Blood - POCT [[id number]]  (Abnormal) Collected:  08/17/16 0708     Updated:  08/17/16 0723     POCT - Glucose Whole blood 315 (H) mg/dL     GFR [[id number]] Collected:  08/17/16 0604     Updated:  08/17/16 0655     EGFR 37.9    Basic Metabolic Panel [[id number]]  (Abnormal) Collected:   08/17/16 0604    Specimen:  Blood Updated:  08/17/16 0655     Glucose 353 (H) mg/dL      BUN 102.4 (  H) mg/dL      Creatinine 2.7 (H) mg/dL      Calcium 8.6 mg/dL      Sodium 132 (L) mEq/L      Potassium 4.3 mEq/L      Chloride 102 mEq/L      CO2 17 (L) mEq/L      Anion Gap 13.0    CBC without differential [[id number]]  (Abnormal) Collected:  08/17/16 0604    Specimen:  Blood from Blood Updated:  08/17/16 0635     WBC 10.02 x10 3/uL      Hgb 9.0 (L) g/dL      Hematocrit 26.0 (L) %      Platelets 382 x10 3/uL      RBC 2.95 (L) x10 6/uL      MCV 88.1 fL      MCH 30.5 pg      MCHC 34.6 g/dL      RDW 15 %      MPV 11.5 fL      Nucleated RBC 0.8 /100 WBC      Absolute NRBC 0.08 (H) x10 3/uL     Culture Blood Aerobic and Anaerobic [[id number]] Collected:  08/14/16 0252    Specimen:  Blood, Venipuncture Updated:  08/17/16 0521    Narrative:       ORDER#: [id number]                                    ORDERED BY: TRAFICANTE, DIA  SOURCE: Blood, Venipuncture R W                      COLLECTED:  08/14/16 02:52  ANTIBIOTICS AT COLL.:                                RECEIVED :  08/14/16 05:15  Culture Blood Aerobic and Anaerobic        PRELIM      08/17/16 05:21  08/15/16   No Growth after 1 day/s of incubation.  08/16/16   No Growth after 2 day/s of incubation.  08/17/16   No Growth after 3 day/s of incubation.      Culture Blood Aerobic and Anaerobic [[id number]] Collected:  08/14/16 0259    Specimen:  Blood, Venipuncture Updated:  08/17/16 0521    Narrative:       ORDER#: [id number]                                    ORDERED BY: TRAFICANTE, DIA  SOURCE: Blood, Venipuncture L H                      COLLECTED:  08/14/16 02:59  ANTIBIOTICS AT COLL.:                                RECEIVED :  08/14/16 05:15  Culture Blood Aerobic and Anaerobic        PRELIM      08/17/16 05:21  08/15/16   No Growth after 1 day/s of incubation.  08/16/16   No Growth after 2 day/s of incubation.  08/17/16   No Growth after 3 day/s of incubation.  Glucose Whole Blood - POCT [[id number]]  (Abnormal) Collected:  08/17/16 0232     Updated:  08/17/16 0235     POCT - Glucose Whole blood 322 (H) mg/dL     Glucose Whole Blood - POCT [[id number]]  (Abnormal) Collected:  08/16/16 2030     Updated:  08/16/16 2123     POCT - Glucose Whole blood 391 (H) mg/dL     Glucose Whole Blood - POCT [[id number]]  (Abnormal) Collected:  08/16/16 1649     Updated:  08/16/16 1659     POCT - Glucose Whole blood 439 (H) mg/dL         Radiology reports have been reviewed:  Radiology Results (24 Hour)     ** No results found for the last 24 hours. **        Echocardiogram Adult Complete W Clr/ Dopp Waveform    Result Date: 08/14/2016  ECHOCARDIOGRAM Sonographer:  Laurena Bering Durohom Indications:  CHF Height (in):  62 Weight (lb):  147 Blood Pressure:  156/67   2-D Measurements Left Ventricle                                          Diastolic Dimension:  58  (05-39 mm) Systolic Dimension:  44  (25-40 mm)     Septal Diastolic Thickness:  12  (6-11 mm)    Posterior Wall Thickness:  12  (6-11 mm) Fractional Shortening Percentage:  24%  (24-46 %)                       Visually Estimated Ejection Fraction:  40%  (55-75 %)                        Right Ventricle Diastolic Dimension:  34  (7-26 mm)                           Left Atrium End Systolic Dimension:  38  (19-40 mm)                    Aortic Root:  38  (20-37 mm)                       Doppler Measurements and Color Flow Imaging Valves                                        Aortic Valve:  2.1  (0.9-1.8 m/s).         Regurgitation:  Mild Pulmonic Valve:  0.9  (0.6-0.9 m/s).     Regurgitation:  Mild Mitral Valve:  1.3  (0.6-1.4 m/s).          Regurgitation:  Moderately severe Tricuspid Valve:  0.5  (0.4-0.8 m/s.     Regurgitation:  Trace Left Ventricular Outflow Tract Velocity:  1.3 m/s. E/A Ratio:  1.2 Est. PASP:  56 Est. RA Mean Pressure:  10 LA Volume 96 (18-58 ml) LA Volume Index 56 (16-28 ml/m2) TAPSE 16 (> or =16 mm) Echocardiogram M-mode, 2D,  spectral Doppler and color flow imaging were performed and interpreted.     1.  The quality of the study is technically adequate for interpretation. 2.  The  left ventricle is enlarged in size. Left ventricular systolic function is reduced. There is akinesis involving the mid and distal anterior wall, septum, and apex.. Estimated EF is 40%. There is mild left ventricular hypertrophy. There is no evidence of abnormal diastolic LV function. 3.  The left atrium is enlarged in size. 4.  The aortic valve is trileaflet. Normal valve excursion is present. There is mild aortic insufficiency. No aortic stenosis is present. The aortic root is mildly enlarged in size. Mild aortic valve sclerosis is seen. 5.  The mitral valve is sclerotic with moderate mitral annular calcification. Moderately severe mitral regurgitation is seen. 6.  The right ventricle is normal in size and function. 7.  The right atrium is normal in size. 8. The tricuspid valve is structurally normal. There is no significant tricuspid insufficiency. 9. The pulmonic valve is structurally normal. There is no significant pulmonic insufficiency. 10. There is  evidence of moderate pulmonary hypertension. 11. No pericardial effusion, intracardiac thrombi, or masses are seen. Pacemaker wire noted 12. The atrial septum is structurally normal. No shunt by color flow doppler. CONCLUSION: Dilated left ventricle with reduced left ventricular systolic function. Estimated ejection fraction of 40%. Regional wall motion abnormalities as above. Mild left ventricular hypertrophy. Aortic valve sclerosis with mild aortic insufficiency. Dilated aortic root. Sclerotic mitral valve with moderately severe mitral regurgitation. Moderate pulmonary hypertension. Pacemaker wire noted. Marnee Spring, MD 08/14/2016 2:10 PM    US Renal Kidney    Result Date: 08/13/2016  History: Acute on chronic kidney disease. COMPARISON: 05/28/2015. FINDINGS: Ultrasound examination kidneys was performed.  Right kidney measures 10 cm, previously 9.8 cm. Left kidney measures 10.5 cm and is stable in size. Evaluation is very limited by the patient's condition. No hydronephrosis is identified. A small right lower pole cyst seen on the prior exam could not be seen which is probably technical. No definite masses are identified. Lobularity in the right renal contour is again seen most likely due to scarring. Renal cortex is normal in thickness. The increased echogenicity suggested in the prior exam is not clearly seen today but possibly technical with the kidneys suboptimally visualized today. Limited views of bladder showed no abnormalities. There is a 380 cc bladder volume. Patient was unable to void.      Limited exam showing normal size kidneys without hydronephrosis. Mild scarring in the right kidney. Haynes Hoehn, MD 08/13/2016 2:01 PM    Xr Chest Ap Portable    Result Date: 08/14/2016  History: Fever. AP portable radiograph compared to 08/14/2014 shows hypoventilation with vascular congestion and borderline cardiac enlargement. No significant consolidation effusion or pneumothorax. Evaluation limited by low volumes.      Hypoventilation limits evaluation. No consolidation. Verner Mould, MD 08/14/2016 12:39 PM    Xr Chest Ap Portable    Result Date: 08/13/2016  HISTORY: Shortness of breath. COMPARISON: 05/26/2015 Portable chest: Left-sided cardiac pacing device is noted. There are low lung volumes accentuating the cardiomediastinal silhouette. There is grossly stable cardiac enlargement. Calcifications are seen at the aortic knob. There is mild vascular congestion without overt pulmonary edema. There is no focal consolidation. No pleural effusion or pneumothorax.      1.  Low lung volumes. Pulmonary vascular congestion without overt pulmonary edema. 2.  Stable cardiomegaly. Park Pope, MD 08/13/2016 5:52 AM    Ct Abdomen Pelvis Wo Iv/ W Po Cont    Result Date: 08/15/2016  History: Fever. Leukocytosis. CT imaging performed  through the abdomen and pelvis following  oral but without intravenous contrast. No comparison available. History: Abdominal pain. Sepsis. CT imaging performed through the abdomen and pelvis following oral but without intravenous contrast. No consolidation on limited imaging through lung bases. The heart is enlarged and coronary artery calcification noted. The unenhanced liver, pancreas, spleen, adrenal glands, and kidneys are normal. No hydroureteronephrosis. Aorta normal caliber of multifocal calcific plaque. The bowel is nondistended. Rectal wall thickening is nonspecific. No surrounding inflammation however. No adenopathy or ascites. Normal appendix seen at the right lower quadrant. No osseous lesions.      Limited unenhanced CT. Nonspecific mild rectal wall thickening. No localizing inflammatory or infectious process to explain sepsis and leukocytosis. Verner Mould, MD 08/15/2016 2:14 PM    Pathology:   Specimens     None        Pending Lab Results:   Labs/Images to be followed at your PCP office: Unresulted Labs     Procedure . . . Date/Time    ANA IFA w/rflx to Titer/Pat/Multiplex 11 [[id number]] Collected:  08/15/16 0734     Updated:  08/15/16 1030         Hospitalist:   Signed by: Orlin Hilding A Bryna Razavi  08/17/2016 2:28 PM  Time spent for discharge: 45 minutes      *This note was generated by the Epic EMR system/ Dragon speech recognition and may contain inherent errors or omissions not intended by the user. Grammatical errors, random word insertions, deletions, pronoun errors and incomplete sentences are occasional consequences of this technology due to software limitations. Not all errors are caught or corrected. If there are questions or concerns about the content of this note or information contained within the body of this dictation they should be addressed directly with the author for clarification

## 2016-08-17 NOTE — PT Progress Note (Signed)
Physical Therapy Cancellation Note    Patient: Marco Bruce  WPV:[id number]    Unit: M251/M251-A    Patient not seen for physical therapy secondary to patient family declined. Pt very tired today. Family requested copied of there ex for home. Copies made and reviewed. Pt to be d/c home today per grandson.    Wynona Meals  PM&R  [phone]

## 2016-08-17 NOTE — Plan of Care (Signed)
Problem: Moderate/High Fall Risk Score >5  Goal: Patient will remain free of falls  Outcome: Progressing   08/17/16 0915   OTHER   Moderate Risk (6-13) MOD-Request PT/OT consult order for patients with gait/mobility impairment;MOD-Include family in multidisciplinary POC discussions     Understands fall risk assessment. Fall risk assessment on board. Patient has remained free of falls this shift.    Problem: Safety  Goal: Patient will be free from injury during hospitalization  Outcome: Progressing   08/17/16 1821   Goal/Interventions addressed this shift   Patient will be free from injury during hospitalization  Assess patient's risk for falls and implement fall prevention plan of care per policy;Provide and maintain safe environment;Use appropriate transfer methods;Ensure appropriate safety devices are available at the bedside;Include patient/ family/ care giver in decisions related to safety;Hourly rounding;Assess for patients risk for elopement and implement Elopement Risk Plan per policy     Rounding per policy.Offered assistance as needed.Bed in lowest position. Safe environment provided.Call bell within reach.   Goal: Patient will be free from infection during hospitalization  Outcome: Progressing   08/17/16 1821   Goal/Interventions addressed this shift   Free from Infection during hospitalization Assess and monitor for signs and symptoms of infection;Monitor lab/diagnostic results     Pt is afebrile.     Problem: Pain  Goal: Pain at adequate level as identified by patient  Outcome: Progressing   08/17/16 1821   Goal/Interventions addressed this shift   Pain at adequate level as identified by patient Identify patient comfort function goal;Assess pain on admission, during daily assessment and/or before any "as needed" intervention(s);Consult/collaborate with Physical Therapy, Occupational Therapy, and/or Speech Therapy;Include patient/patient care companion in decisions related to pain management as needed      Patient denies pain at this time.     Problem: Discharge Barriers  Goal: Patient will be discharged home or other facility with appropriate resources  Outcome: Progressing   08/17/16 1821   Goal/Interventions addressed this shift   Discharge to home or other facility with appropriate resources Provide appropriate patient education;Provide information on available health resources;Initiate discharge planning       Problem: Psychosocial and Spiritual Needs  Goal: Demonstrates ability to cope with hospitalization/illness  Outcome: Progressing   08/17/16 1821   Goal/Interventions addressed this shift   Demonstrates ability to cope with hospitalizations/illness Encourage verbalization of feelings/concerns/expectations;Provide quiet environment;Assist patient to identify own strengths and abilities;Encourage patient to set small goals for self;Encourage participation in diversional activity     No needs observed or vocalized at this time.    Problem: Heart Failure  Goal: Stable vital signs and fluid balance  Outcome: Progressing   08/17/16 1821   Goal/Interventions addressed this shift   Stable vital signs and fluid balance Monitor/assess vital signs and telemetry per unit protocol;Assess signs and symptoms associated with cardiac rhythm changes;Monitor intake/output per unit protocol and/or LIP order;Monitor lab values;Monitor for leg swelling/edema and report to LIP if abnormal     Goal: Mobility/Activity is maintained at optimal level for patient  Outcome: Progressing   08/17/16 1821   Goal/Interventions addressed this shift   Mobility/activity is maintained at optimal level for patient Encourage independent activity per ability;Maintain proper body alignment;Plan activities to conserve energy, plan rest periods;Assess for changes in respiratory status, level of consciousness and/or development of fatigue     Goal: Nutritional intake is adequate  Outcome: Progressing   08/17/16 1821   Goal/Interventions addressed  this shift   Nutritional intake is adequate  Assist patient with meals/food selection;Allow adequate time for meals;Encourage/perform oral hygiene as appropriate;Encourage/administer dietary supplements as ordered (i.e. tube feed, TPN, oral, OGT/NGT, supplements);Consult/collaborate with Clinical Nutritionist;Include patient/patient care companion in decisions related to nutrition       Problem: Compromised Tissue integrity  Goal: Damaged tissue is healing and protected  Outcome: Progressing   08/17/16 1821   Goal/Interventions addressed this shift   Damaged tissue is healing and protected  Monitor/assess Braden scale every shift;Increase activity as tolerated/progressive mobility;Avoid shearing injuries;Keep intact skin clean and dry;Use incontinence wipes for cleaning urine, stool and caustic drainage. Foley care as needed;Monitor external devices/tubes for correct placement to prevent pressure, friction and shearing;Encourage use of lotion/moisturizer on skin;Monitor patient's hygiene practices;Consider placing an indwelling catheter if incontinence interferes with healing of stage 3 or 4 pressure injury     Goal: Nutritional status is improving  Outcome: Progressing   08/17/16 1821   Goal/Interventions addressed this shift   Nutritional status is improving Assist patient with eating;Allow adequate time for meals;Encourage patient to take dietary supplement(s) as ordered;Collaborate with Clinical Nutritionist;Include patient/patient care companion in decisions related to nutrition

## 2016-08-17 NOTE — Progress Notes (Signed)
Pt discharged to home in stable condition accompanied by his daughter via stretcher at [phone], Discharge instruction given to pt's daughter, erbalized understanding, all belongings sent with pt.

## 2016-08-17 NOTE — Progress Notes (Addendum)
Met w/ Danielle Rankin at bedside. Discussed about d/c arrangements. Per him, pt's family including his daughter, [name] doesn't want him to go to SNF and want him to return home. Grandson expressed understanding that pt requires maximum assistance for transfers. W/C to be delivered to pt's home on 08/19/16. Skilled [facility] services were arranged through [facility]. Pt has Medicaid Waiver aide services for 8 hrs a day, 7 days a week through [facility]. Grandson assured that someone will be w/ [name] at all times. PCP f/u arranged on 5/26/ at 1:45 p.m. Informed Grandson to call [facility] at [phone] for medical transportation to [place] appointments. Arranged ambulance transportation to home. Provided IMM letter.        08/17/16 1428   Discharge Disposition   Patient preference/choice provided? Yes   Physical Discharge Disposition Home with Needs   Name of Ruth   Name of Ivy  (W/C was ordered to be delivered on 5/17)   Mode of Transportation Ambulance   Pick up time 5  p.m   Patient/Family/POA notified of transfer plan Yes   [facility];Home PT/OT/ST;Home Health Aide   CM Interventions   Follow up appointment scheduled? Yes   Follow up appointment scheduled with: PCP  (7/26 at 1:45 p.m)   Referral made for home health RN visit? Yes   Multidisciplinary rounds/family meeting before d/c? Yes   Medicare Checklist   Is this a Medicare patient? Yes   If LOS 3 days or greater, did patient received 2nd IMM Letter? Yes

## 2016-08-17 NOTE — Progress Notes (Signed)
Infectious Disease            Progress Note    08/17/2016   Marco Bruce KQA:[id number] is a 83 y.o. male, history of hypertension, diabetes mellitus, gout, congestive heart failure, renal insufficiency, osteoarthritis, who was admitted with systemic inflammatory response syndrome/sepsis, congestive heart failure, and altered mental status.    Subjective:     Marco Bruce today Symptoms:  Afebrile. More awake . Weak and lethargic. Denies any significant diarrhea.   Other review of system is non contributory.    Objective:     Blood pressure 142/72, pulse 76, temperature 97 F (36.1 C), resp. rate 16, height 1.626 m (5\' 4" ), weight 68.9 kg (152 lb), SpO2 99 %.    General Appearance: Weak and lethargic   HEENT: Pallor positive, Anicteric sclera.   Neck: Supple  Lungs: Bilateral coarse breath sounds  Chest Wall: Symmetric chest wall expansion.   Heart : S1 and S2.   Abdomen: Abdomen is Soft, no significant tenderness  Neurological: Weak and lethargic    Laboratory And Diagnostic Studies:     Recent Labs      08/17/16   0604  08/16/16   0623  08/15/16   0735   WBC  10.02  9.43  13.59*   Hgb  9.0*  8.8*  8.7*   Hematocrit  26.0*  25.5*  25.8*   Platelets  382  326  327   Neutrophils   --   84.9  76.5     Recent Labs      08/17/16   0604  08/16/16   0623   Sodium  132*  135*   Potassium  4.3  4.5   Chloride  102  102   CO2  17*  18*   BUN  102.4*  92.9*   Creatinine  2.7*  2.6*   Glucose  353*  288*   Calcium  8.6  8.5     Recent Labs      08/16/16   0623  08/15/16   0735   AST (SGOT)  53*  25   ALT  22  13   Alkaline Phosphatase  91  83   Protein, Total  5.2*  5.2*   Albumin  2.5*  2.6*   Bilirubin, Total  0.4  0.6       Current Med's:     Current Facility-Administered Medications   Medication Dose Route Frequency   . allopurinol  100 mg Oral Daily   . amoxicillin-clavulanate  1 tablet Oral Daily   . aspirin EC  325 mg Oral Daily   . atorvastatin  40 mg Oral QPM   . azithromycin  500 mg Intravenous  Q24H [place]   . bisacodyl  10 mg Rectal Once   . enoxaparin  30 mg Subcutaneous Daily   . gabapentin  100 mg Oral TID   . iron sucrose  200 mg Intravenous Q24H [place]   . lactobacillus/streptococcus  1 capsule Oral Daily   . NIFEdipine  30 mg Oral Daily   . pantoprazole  40 mg Oral QAM AC   . senna-docusate  2 tablet Oral BID       Assessment:      Condition: guarded   Systemic inflammatory response syndrome/sepsis   Altered mental status   Bronchitis   Acute on chronic renal failure   Pulmonary hypertension   Osteoarthritis   Gout/Myositis?   Diabetes mellitus   Hypertension   Congestive heart failure  Status post pacemaker placement    Plan:      Start Augmentin   Discontinue Zosyn   Continue Zithromax   Continue steroids   ANA with reflex   Correction of electrolytes   Continue supportive care   Discussed with family   Physical therapy          Serafina Royals, M.D.,FACP  08/17/2016  9:15 AM          *This note was generated by the Epic EMR system/ Dragon speech recognition and may contain inherent errors or omissions not intended by the user. Grammatical errors, random word insertions, deletions, pronoun errors and incomplete sentences are occasional consequences of this technology due to software limitations. Not all errors are caught or corrected. If there are questions or concerns about the content of this note or information contained within the body of this dictation they should be addressed directly with the author for clarification

## 2016-08-17 NOTE — Progress Notes (Signed)
08/17/16 1657   Provider Notification   Reason for Communication Other (Comment)   Critical Lab POCT   Critical Lab Value BS 407   Provider Name Dr Karlyne Greenspan   Provider Role Hospitalist   Method of Communication Call   Readback Results No   Response No new orders

## 2016-08-18 ENCOUNTER — Inpatient Hospital Stay: Payer: Medicare Other | Admitting: Critical Care Medicine

## 2016-08-18 ENCOUNTER — Emergency Department: Payer: Medicare Other

## 2016-08-18 ENCOUNTER — Inpatient Hospital Stay
Admission: EM | Admit: 2016-08-18 | Discharge: 2016-08-23 | DRG: 638 | Disposition: A | Discharge status: Skilled Nursing Facility | Payer: Medicare Other | Attending: Internal Medicine | Admitting: Internal Medicine

## 2016-08-18 ENCOUNTER — Encounter (INDEPENDENT_AMBULATORY_CARE_PROVIDER_SITE_OTHER): Payer: Self-pay

## 2016-08-18 DIAGNOSIS — R778 Other specified abnormalities of plasma proteins: Secondary | ICD-10-CM

## 2016-08-18 DIAGNOSIS — M1990 Unspecified osteoarthritis, unspecified site: Secondary | ICD-10-CM | POA: Diagnosis present

## 2016-08-18 DIAGNOSIS — E785 Hyperlipidemia, unspecified: Secondary | ICD-10-CM | POA: Diagnosis present

## 2016-08-18 DIAGNOSIS — R531 Weakness: Secondary | ICD-10-CM

## 2016-08-18 DIAGNOSIS — E1165 Type 2 diabetes mellitus with hyperglycemia: Principal | ICD-10-CM | POA: Diagnosis present

## 2016-08-18 DIAGNOSIS — D649 Anemia, unspecified: Secondary | ICD-10-CM | POA: Diagnosis present

## 2016-08-18 DIAGNOSIS — R739 Hyperglycemia, unspecified: Secondary | ICD-10-CM

## 2016-08-18 DIAGNOSIS — Z794 Long term (current) use of insulin: Secondary | ICD-10-CM

## 2016-08-18 DIAGNOSIS — M353 Polymyalgia rheumatica: Secondary | ICD-10-CM | POA: Diagnosis present

## 2016-08-18 DIAGNOSIS — E871 Hypo-osmolality and hyponatremia: Secondary | ICD-10-CM | POA: Diagnosis present

## 2016-08-18 DIAGNOSIS — I081 Rheumatic disorders of both mitral and tricuspid valves: Secondary | ICD-10-CM | POA: Diagnosis present

## 2016-08-18 DIAGNOSIS — Z79899 Other long term (current) drug therapy: Secondary | ICD-10-CM

## 2016-08-18 DIAGNOSIS — E138 Other specified diabetes mellitus with unspecified complications: Secondary | ICD-10-CM

## 2016-08-18 DIAGNOSIS — R627 Adult failure to thrive: Secondary | ICD-10-CM | POA: Diagnosis present

## 2016-08-18 DIAGNOSIS — T380X5A Adverse effect of glucocorticoids and synthetic analogues, initial encounter: Secondary | ICD-10-CM | POA: Diagnosis present

## 2016-08-18 DIAGNOSIS — E1122 Type 2 diabetes mellitus with diabetic chronic kidney disease: Secondary | ICD-10-CM | POA: Diagnosis present

## 2016-08-18 DIAGNOSIS — D72829 Elevated white blood cell count, unspecified: Secondary | ICD-10-CM | POA: Diagnosis present

## 2016-08-18 DIAGNOSIS — I5032 Chronic diastolic (congestive) heart failure: Secondary | ICD-10-CM | POA: Diagnosis present

## 2016-08-18 DIAGNOSIS — I447 Left bundle-branch block, unspecified: Secondary | ICD-10-CM | POA: Diagnosis present

## 2016-08-18 DIAGNOSIS — M109 Gout, unspecified: Secondary | ICD-10-CM | POA: Diagnosis present

## 2016-08-18 DIAGNOSIS — I130 Hypertensive heart and chronic kidney disease with heart failure and stage 1 through stage 4 chronic kidney disease, or unspecified chronic kidney disease: Secondary | ICD-10-CM | POA: Diagnosis present

## 2016-08-18 DIAGNOSIS — Z950 Presence of cardiac pacemaker: Secondary | ICD-10-CM

## 2016-08-18 DIAGNOSIS — I252 Old myocardial infarction: Secondary | ICD-10-CM

## 2016-08-18 DIAGNOSIS — N183 Chronic kidney disease, stage 3 (moderate): Secondary | ICD-10-CM | POA: Diagnosis present

## 2016-08-18 DIAGNOSIS — M545 Low back pain, unspecified: Secondary | ICD-10-CM

## 2016-08-18 DIAGNOSIS — N289 Disorder of kidney and ureter, unspecified: Secondary | ICD-10-CM

## 2016-08-18 LAB — TROPONIN I: Troponin I: 0.22 ng/mL — ABNORMAL HIGH (ref 0.00–0.09)

## 2016-08-18 LAB — URINALYSIS, REFLEX TO MICROSCOPIC EXAM IF INDICATED
Bilirubin, UA: NEGATIVE
Blood, UA: NEGATIVE
Glucose, UA: >=500 — AB
Ketones UA: NEGATIVE
Leukocyte Esterase, UA: NEGATIVE
Nitrite, UA: NEGATIVE
Protein, UR: NEGATIVE
Specific Gravity UA: 1.012 (ref 1.001–1.035)
Urine pH: 6 (ref 5.0–8.0)
Urobilinogen, UA: NORMAL mg/dL

## 2016-08-18 LAB — CBC AND DIFFERENTIAL
Absolute NRBC: 0.07 10*3/uL — ABNORMAL HIGH
Basophils Absolute Automated: 0.03 10*3/uL (ref 0.00–0.20)
Basophils Automated: 0.3 %
Eosinophils Absolute Automated: 0.45 10*3/uL (ref 0.00–0.70)
Eosinophils Automated: 4 %
Hematocrit: 27.1 % — ABNORMAL LOW (ref 42.0–52.0)
Hgb: 9.2 g/dL — ABNORMAL LOW (ref 13.0–17.0)
Immature Granulocytes Absolute: 0.27 10*3/uL — ABNORMAL HIGH
Immature Granulocytes: 2.4 %
Lymphocytes Absolute Automated: 1.59 10*3/uL (ref 0.50–4.40)
Lymphocytes Automated: 14 %
MCH: 30.3 pg (ref 28.0–32.0)
MCHC: 33.9 g/dL (ref 32.0–36.0)
MCV: 89.1 fL (ref 80.0–100.0)
MPV: 11 fL (ref 9.4–12.3)
Monocytes Absolute Automated: 0.97 10*3/uL (ref 0.00–1.20)
Monocytes: 8.5 %
Neutrophils Absolute: 8.05 10*3/uL (ref 1.80–8.10)
Neutrophils: 70.8 %
Nucleated RBC: 0.6 /100 WBC (ref 0.0–1.0)
Platelets: 488 10*3/uL — ABNORMAL HIGH (ref 140–400)
RBC: 3.04 10*6/uL — ABNORMAL LOW (ref 4.70–6.00)
RDW: 15 % (ref 12–15)
WBC: 11.36 10*3/uL — ABNORMAL HIGH (ref 3.50–10.80)

## 2016-08-18 LAB — GFR: EGFR: 24.5

## 2016-08-18 LAB — COMPREHENSIVE METABOLIC PANEL
ALT: 21 U/L (ref 0–55)
AST (SGOT): 27 U/L (ref 5–34)
Albumin/Globulin Ratio: 1 (ref 0.9–2.2)
Albumin: 2.8 g/dL — ABNORMAL LOW (ref 3.5–5.0)
Alkaline Phosphatase: 117 U/L — ABNORMAL HIGH (ref 38–106)
Anion Gap: 11 (ref 5.0–15.0)
BUN: 95 mg/dL — ABNORMAL HIGH (ref 9.0–28.0)
Bilirubin, Total: 0.3 mg/dL (ref 0.2–1.2)
CO2: 22 mEq/L (ref 22–29)
Calcium: 8.6 mg/dL (ref 7.9–10.2)
Chloride: 98 mEq/L — ABNORMAL LOW (ref 100–111)
Creatinine: 2.5 mg/dL — ABNORMAL HIGH (ref 0.7–1.3)
Globulin: 2.7 g/dL (ref 2.0–3.6)
Glucose: 628 mg/dL (ref 70–100)
Potassium: 4 mEq/L (ref 3.5–5.1)
Protein, Total: 5.5 g/dL — ABNORMAL LOW (ref 6.0–8.3)
Sodium: 131 mEq/L — ABNORMAL LOW (ref 136–145)

## 2016-08-18 LAB — ECG 12-LEAD
Atrial Rate: 68 {beats}/min
P-R Interval: 140 ms
Q-T Interval: 504 ms
QRS Duration: 200 ms
QTC Calculation (Bezet): 535 ms
R Axis: -84 degrees
T Axis: 161 degrees
Ventricular Rate: 68 {beats}/min

## 2016-08-18 LAB — GLUCOSE WHOLE BLOOD - POCT
Whole Blood Glucose POCT: 426 mg/dL — ABNORMAL HIGH (ref 70–100)
Whole Blood Glucose POCT: 426 mg/dL — ABNORMAL HIGH (ref 70–100)
Whole Blood Glucose POCT: 383 mg/dL — ABNORMAL HIGH (ref 70–100)
Whole Blood Glucose POCT: 305 mg/dL — ABNORMAL HIGH (ref 70–100)
Whole Blood Glucose POCT: 277 mg/dL — ABNORMAL HIGH (ref 70–100)
Whole Blood Glucose POCT: 294 mg/dL — ABNORMAL HIGH (ref 70–100)
Whole Blood Glucose POCT: 293 mg/dL — ABNORMAL HIGH (ref 70–100)
Whole Blood Glucose POCT: 267 mg/dL — ABNORMAL HIGH (ref 70–100)
Whole Blood Glucose POCT: 245 mg/dL — ABNORMAL HIGH (ref 70–100)

## 2016-08-18 LAB — ANA IFA W/REFLEX TO TITER/PATTERN/AB: ANA Screen, IFA: NEGATIVE

## 2016-08-18 MED ORDER — SODIUM CHLORIDE 0.9 % IV SOLN
4.0000 [IU]/h | INTRAVENOUS | Status: DC
Start: 2016-08-18 — End: 2016-08-19
  Administered 2016-08-18: 16:00:00 6 [IU]/h via INTRAVENOUS
  Filled 2016-08-18: qty 1

## 2016-08-18 MED ORDER — DEXTROSE 50 % IV SOLN
12.5000 g | INTRAVENOUS | Status: DC | PRN
Start: 2016-08-18 — End: 2016-08-23

## 2016-08-18 MED ORDER — SODIUM BICARBONATE 650 MG PO TABS
1300.0000 mg | ORAL_TABLET | Freq: Two times a day (BID) | ORAL | Status: DC
Start: 2016-08-18 — End: 2016-08-23
  Administered 2016-08-18 – 2016-08-23 (×11): 1300 mg via ORAL
  Filled 2016-08-18 (×12): qty 2

## 2016-08-18 MED ORDER — SODIUM CHLORIDE 0.9 % IV MBP
1.0000 g | INTRAVENOUS | Status: DC
Start: 2016-08-18 — End: 2016-08-22
  Administered 2016-08-18 – 2016-08-21 (×4): 1 g via INTRAVENOUS
  Filled 2016-08-18 (×5): qty 1000

## 2016-08-18 MED ORDER — PREDNISONE 20 MG PO TABS
20.0000 mg | ORAL_TABLET | Freq: Every day | ORAL | Status: DC
Start: 2016-08-18 — End: 2016-08-18

## 2016-08-18 MED ORDER — GLUCOSE 40 % PO GEL
15.0000 g | ORAL | Status: DC | PRN
Start: 2016-08-18 — End: 2016-08-23

## 2016-08-18 MED ORDER — RISAQUAD PO CAPS
1.0000 | ORAL_CAPSULE | Freq: Every day | ORAL | Status: DC
Start: 2016-08-18 — End: 2016-08-23
  Administered 2016-08-18 – 2016-08-23 (×6): 1 via ORAL
  Filled 2016-08-18 (×6): qty 1

## 2016-08-18 MED ORDER — SODIUM CHLORIDE 0.9 % IV BOLUS
1000.0000 mL | Freq: Once | INTRAVENOUS | Status: AC
Start: 2016-08-18 — End: 2016-08-18
  Administered 2016-08-18: 14:00:00 1000 mL via INTRAVENOUS

## 2016-08-18 MED ORDER — GABAPENTIN 100 MG PO CAPS
100.0000 mg | ORAL_CAPSULE | Freq: Three times a day (TID) | ORAL | Status: DC
Start: 2016-08-18 — End: 2016-08-23
  Administered 2016-08-18 – 2016-08-23 (×16): 100 mg via ORAL
  Filled 2016-08-18 (×16): qty 1

## 2016-08-18 MED ORDER — LIDOCAINE 5 % EX PTCH
1.0000 | MEDICATED_PATCH | Freq: Every day | CUTANEOUS | Status: DC
Start: 2016-08-18 — End: 2016-08-23
  Administered 2016-08-18 – 2016-08-23 (×6): 1 via TRANSDERMAL
  Filled 2016-08-18 (×5): qty 1
  Filled 2016-08-18: qty 2
  Filled 2016-08-18 (×2): qty 1

## 2016-08-18 MED ORDER — PANTOPRAZOLE SODIUM 40 MG PO TBEC
40.0000 mg | DELAYED_RELEASE_TABLET | Freq: Every morning | ORAL | Status: DC
Start: 2016-08-18 — End: 2016-08-23
  Administered 2016-08-18 – 2016-08-23 (×6): 40 mg via ORAL
  Filled 2016-08-18 (×6): qty 1

## 2016-08-18 MED ORDER — ALLOPURINOL 100 MG PO TABS
100.0000 mg | ORAL_TABLET | Freq: Every day | ORAL | Status: DC
Start: 2016-08-18 — End: 2016-08-23
  Administered 2016-08-18 – 2016-08-23 (×6): 100 mg via ORAL
  Filled 2016-08-18 (×6): qty 1

## 2016-08-18 MED ORDER — PREDNISONE 10 MG PO TABS
10.0000 mg | ORAL_TABLET | Freq: Every day | ORAL | Status: DC
Start: 2016-08-19 — End: 2016-08-21
  Administered 2016-08-19 – 2016-08-20 (×2): 10 mg via ORAL
  Filled 2016-08-18 (×3): qty 1

## 2016-08-18 MED ORDER — NIFEDIPINE ER OSMOTIC RELEASE 30 MG PO TB24
60.0000 mg | ORAL_TABLET | Freq: Every day | ORAL | Status: DC
Start: 2016-08-18 — End: 2016-08-23
  Administered 2016-08-18 – 2016-08-23 (×6): 60 mg via ORAL
  Filled 2016-08-18 (×6): qty 2

## 2016-08-18 MED ORDER — SODIUM CHLORIDE 0.9 % IV SOLN
INTRAVENOUS | Status: DC
Start: 2016-08-18 — End: 2016-08-19

## 2016-08-18 MED ORDER — ATORVASTATIN CALCIUM 20 MG PO TABS
40.0000 mg | ORAL_TABLET | Freq: Every evening | ORAL | Status: DC
Start: 2016-08-18 — End: 2016-08-23
  Administered 2016-08-18 – 2016-08-23 (×6): 40 mg via ORAL
  Filled 2016-08-18 (×6): qty 2

## 2016-08-18 MED ORDER — INSULIN REGULAR HUMAN 100 UNIT/ML IJ SOLN
5.0000 [IU] | Freq: Once | INTRAMUSCULAR | Status: AC
Start: 2016-08-18 — End: 2016-08-18
  Administered 2016-08-18: 14:00:00 5 [IU] via INTRAVENOUS
  Filled 2016-08-18: qty 15

## 2016-08-18 MED ORDER — GLUCAGON 1 MG IJ SOLR (WRAP)
1.0000 mg | INTRAMUSCULAR | Status: DC | PRN
Start: 2016-08-18 — End: 2016-08-23

## 2016-08-18 NOTE — ED Notes (Signed)
Pt ready for admission, no consideration for isolation/private room

## 2016-08-18 NOTE — Progress Notes (Signed)
TCM Medicare Navigator    Navigator contacted PCP Candelaria Celeste, MD @ [phone] and spoke with Lenna Sciara. This Probation officer informed Lenna Sciara pt needs to be seen within 3-5 days after being discharge from the hospital. Per Melissa patient scheduled the appt for 5/26 @ 1:45pm, but will attempt to contact pt to schedule appt at an earlier date.    Per [place] patients SOC is for 5/16.    Navigator will continue to monitor pt.    Anne Shutter, BS  TCM Medicare Navigator   [facility]  [address]  [place] [address]  T [phone] F [phone]

## 2016-08-18 NOTE — ED Provider Notes (Addendum)
Physician/Midlevel provider first contact with patient: 08/18/16 1221         History     Chief Complaint   Patient presents with   . Social Difficulties     CC: Weakness  HPI: Approximate 1 week history of generalized fatigue.  Recent hospital discharge congestive heart failure.  Negative headache/chest pain/current shortness breath/fever/urinary or bowel changes.      The history is provided by the patient and a relative. The history is limited by a language barrier (Prefers daughter interpreting [place]).            Past Medical History:   Diagnosis Date   . Congestive heart failure (CHF)    . Diabetes mellitus    . Gout     chronic   . Hypertension        Past Surgical History:   Procedure Laterality Date   . ABDOMINAL SURGERY         No family history on file.    Social  Social History   Substance Use Topics   . Smoking status: Never Smoker   . Smokeless tobacco: Never Used   . Alcohol use No      Comment: former alcoholic       .     No Known Allergies    Home Medications             allopurinol (ZYLOPRIM) 100 MG tablet     Take 1 tablet (100 mg total) by mouth daily.     amoxicillin-clavulanate (AUGMENTIN) 500-125 MG per tablet     Take 1 tablet by mouth every 12 (twelve) hours.     atorvastatin (LIPITOR) 40 MG tablet     Take 1 tablet (40 mg total) by mouth every evening.     chlorthalidone (HYGROTEN) 50 MG tablet     TAKE ONE TABLET BY MOUTH EVERY DAY IN THE MORNING WITH FOOD     cyclobenzaprine (FLEXERIL) 10 MG tablet     Take 1 tablet (10 mg total) by mouth every 8 (eight) hours as needed for Muscle spasms.     docusate sodium (COLACE) 100 MG capsule     Take 100 mg by mouth 2 (two) times daily.     gabapentin (NEURONTIN) 100 MG capsule     TAKE ONE CAPSULE BY MOUTH EVERY 8 HOURS     glucose blood (TRUE METRIX BLOOD GLUCOSE TEST) test strip     1 each by Other route 2 (two) times daily. Use as instructed     insulin glargine 100 UNIT/ML injection pen     Inject 8 Units into the skin nightly.      Insulin Pen Needle 31G X 5 MM Misc     INJECT INSULIN DAILY AS DIRECTED     lactobacillus/streptococcus (RISAQUAD) Cap     Take 1 capsule by mouth daily.     lidocaine (LIDODERM) 5 %     Place 1 patch onto the skin daily.Remove & Discard patch within 12 hours or as directed by MD     Linagliptin 5 MG Tab     Take 5 mg by mouth.     NIFEdipine (PROCARDIA XL) 60 MG 24 hr tablet     TAKE 2 TABLETS BY MOUTH DAILY     pantoprazole (PROTONIX) 40 MG tablet     TAKE ONE TABLET BY MOUTH EVERY DAY     predniSONE (DELTASONE) 20 MG tablet     Take 1 tablet (20 mg total)  by mouth daily.     sodium bicarbonate 650 MG tablet     Take 2 tablets (1,300 mg total) by mouth 2 (two) times daily.           Review of Systems   Unable to perform ROS: Other   Constitutional: Positive for fatigue. Negative for fever.   Respiratory: Negative for cough and shortness of breath.    Cardiovascular: Negative for chest pain.   Gastrointestinal: Negative for abdominal pain, blood in stool, diarrhea and vomiting.   Genitourinary: Negative for hematuria.   Neurological: Negative for headaches.       Physical Exam    BP: 159/65, Heart Rate: 68, Resp Rate: 18, SpO2: 98 %, Weight: 72.6 kg    Physical Exam   Constitutional: He is oriented to person, place, and time. He appears well-developed. No distress.   Mildly fatigued appearing   HENT:   Head: Normocephalic and atraumatic.   Mouth/Throat: Oropharynx is clear and moist. No oropharyngeal exudate.   Eyes: Conjunctivae and EOM are normal. Pupils are equal, round, and reactive to light. No scleral icterus.   Neck: Normal range of motion. Neck supple. No JVD present.   Cardiovascular: Normal rate and regular rhythm.    No murmur heard.  Pulmonary/Chest: Effort normal and breath sounds normal. No respiratory distress.   Abdominal: Soft. He exhibits no mass. There is no tenderness.   Musculoskeletal: Normal range of motion. He exhibits no edema.        Right shoulder: He exhibits no deformity.    Lymphadenopathy:     He has no cervical adenopathy.   Neurological: He is alert and oriented to person, place, and time. He has normal strength. GCS eye subscore is 4. GCS verbal subscore is 5. GCS motor subscore is 6.   Skin: Skin is warm. No rash noted.   Psychiatric: He has a normal mood and affect. His speech is normal.         MDM and ED Course     ED Medication Orders     Start Ordered     Status Ordering Provider    08/18/16 1319 08/18/16 1318  insulin regular (HumuLIN R,NovoLIN R) injection 5 Units  Once     Route: Intravenous  Ordered Dose: 5 Units     Ordered Layken Doenges JEFFREY    08/18/16 1319 08/18/16 1318  sodium chloride 0.9 % bolus 1,000 mL  Once     Route: Intravenous  Ordered Dose: 1,000 mL     Ordered Jennye Runquist JEFFREY             MDM  Number of Diagnoses or Management Options  Anemia, unspecified type: minor  Diabetes mellitus of other type with complication, unspecified long term insulin use status: established and worsening  Elevated troponin: new and requires workup  Hyperglycemia: new and requires workup  Hyponatremia: new and requires workup  Renal insufficiency: minor  Weakness: new and requires workup  Diagnosis management comments: EKG (interpreted by myself): AV paced@68 , left axis deviation, intraventricular conduction delay, nonspecific ST changes, without significant changes prior    1:56 PM  Admission accepted by critical care.       Amount and/or Complexity of Data Reviewed  Clinical lab tests: ordered and reviewed  Tests in the radiology section of CPT: ordered and reviewed  Obtain history from someone other than the patient: yes  Independent visualization of images, tracings, or specimens: yes    Risk of Complications, Morbidity, and/or Mortality  Presenting problems: high  Diagnostic procedures: high  Management options: high    Critical Care  Total time providing critical care: 30-74 minutes    Patient Progress  Patient progress: stable                   Critical  Care  Performed by: Horald Chestnut JEFFREY  Authorized by: Horald Chestnut JEFFREY     Critical care provider statement:     Critical care time (minutes):  30    Critical care was necessary to treat or prevent imminent or life-threatening deterioration of the following conditions:  Cardiac failure, circulatory failure, endocrine crisis, metabolic crisis and renal failure    Critical care was time spent personally by me on the following activities:  Discussions with primary provider, evaluation of patient's response to treatment, examination of patient, interpretation of cardiac output measurements, obtaining history from patient or surrogate, ordering and performing treatments and interventions, ordering and review of laboratory studies, pulse oximetry, ordering and review of radiographic studies, re-evaluation of patient's condition and review of old charts        Clinical Impression & Disposition     Clinical Impression  Final diagnoses:   Weakness   Diabetes mellitus of other type with complication, unspecified long term insulin use status   Hyperglycemia   Renal insufficiency   Elevated troponin   Anemia, unspecified type   Hyponatremia        ED Disposition     ED Disposition Condition Date/Time Comment    Admit to [facility]  Wed Aug 18, 2016  1:30 PM            New Prescriptions    No medications on file                 Duayne Cal, MD  08/18/16 1336       Duayne Cal, MD  08/18/16 1500

## 2016-08-18 NOTE — Progress Notes (Signed)
Home Health Discharge Information     Your doctor has ordered Skilled Nursing, Physical Therapy, Occupational Therapy and Other Wheelchair in-home service(s) for you while you recuperate at home, to assist you in the transition from hospital to home.    The agency that you or your representative chose to provide the service:  Name of [place] ([place]:  Name of DME Agency: [facility] [phone]) - Please call to confirm and schedule delivery   Equipment Ordered:   Wheelchair

## 2016-08-18 NOTE — Plan of Care (Signed)
Problem: Safety  Goal: Patient will be free from injury during hospitalization  Outcome: Progressing

## 2016-08-18 NOTE — ED Provider Notes (Signed)
I, Yehuda Budd, D.O., have personally seen and examined this patient, and have fully participated in the patient's care.  I agree with all pertinent and available clinical information, including history, physical examination, clinical impression, assessment, and plan as documented by the [facility] except as noted.     Harlin Rain, DO  08/18/16 2244

## 2016-08-18 NOTE — H&P (Addendum)
ADMISSION HISTORY AND PHYSICAL EXAM    Date Time: 08/18/16 3:25 PM  Patient Name: Marco Bruce  Attending Physician: Duayne Cal*    Assessment:   Elderly male with diabetes mellitus, chronic kidney disease, hypertension, diastolic congestive heart failure, mitral regurgitation with admission for systemic inflammatory response syndrome and gout with acute on chronic kidney injury readmitted day after discharge with severe hyperglycemia, weakness, failure to thrive, leukocytosis, rule out recurrent infection, rule out suboptimal diabetic control.  He has persistent renal dysfunction with elevated BUN and creatinine.Hyperglycemia and leukocytosis, probably secondary to prednisone treatment.    Plan:   Panculture.  Careful IV fluids, intravenous insulin for glycemic control.  Nephrology follow-up.  Will need physical therapy.  Antibiotics as needed taper steroids     History of Present Illness:   Marco Bruce is a 83 y.o. [place] born male  Living in the [place] in the [place], with diabetes mellitus, gout, chronic kidney disease stage III, hypertension, diastolic congestive heart failure and severe valve disease with severe mitral regurgitation and tricuspid regurgitation.  The patient was discharged yesterday from the hospital after he was admitted with fever, systemic inflammatory response syndrome, demand ischemia, non-STEMI with troponin 1.4 and was given fluids and broad-spectrum antibiotics with good response.  He was also treated for acute on chronic kidney disease, hyperglycemia, and gout.  He was followed by nephrology.  The patient was discharged home on oral Augmentin.  Renal function was at baseline with creatinine around 2.3.  This morning the patient was seen by visiting nurse and found to be weak with little responsiveness.  He presented to the emergency room where he was found to have a blood sugar greater than 601 focal neurologic deficits.  He has renal dysfunction.  Previous  renal ultrasound showed right renal atrophy.  At the time of our exam, he is responsive and follows simple commands but has generalized weakness.  Vital signs of being stable.  Critical care consultation was obtained from the emergency room to the patient's severe hyperglycemia.  There is no fever.  There is no diarrhea.  He is admitted to the intermediate care unit for glycemic control and further workup.    Past Medical History:     Past Medical History:   Diagnosis Date   . Congestive heart failure (CHF)    . Diabetes mellitus    . Gout     chronic   . Hypertension    Hospitalized May 2018 for acute on chronic kidney injury and systemic inflammatory response, gout.  History of moderate to severe mitral regurgitation and tricuspid regurgitation.  Hypertension.  Chronic kidney disease.  Creatinine 2.3.  Right renal atrophy   -history of pacemaker placement   Past Surgical History:     Past Surgical History:   Procedure Laterality Date   . ABDOMINAL SURGERY         Family History:   No family history on file.    Social History:     Social History     Social History   . Marital status: Married     Spouse name: N/A   . Number of children: N/A   . Years of education: N/A     Social History Main Topics   . Smoking status: Never Smoker   . Smokeless tobacco: Never Used   . Alcohol use No      Comment: former alcoholic   . Drug use: No   . Sexual activity: Not on file  Other Topics Concern   . Not on file     Social History Narrative   . No narrative on file       Allergies:   No Known Allergies    Medications:     (Not in a hospital admission)  Scheduled Meds:  Current Facility-Administered Medications   Medication Dose Route Frequency   . allopurinol  100 mg Oral Daily   . atorvastatin  40 mg Oral QPM   . gabapentin  100 mg Oral TID   . lactobacillus/streptococcus  1 capsule Oral Daily   . lidocaine  1 patch Transdermal Daily   . NIFEdipine  60 mg Oral Daily   . pantoprazole  40 mg Oral QAM AC   . predniSONE  20 mg  Oral Daily   . sodium bicarbonate  1,300 mg Oral BID     Continuous Infusions:  . insulin (regular) infusion       PRN Meds:.Nursing communication: Adult Hypoglycemia Treatment Algorithm **AND** dextrose **AND** dextrose **AND** glucagon (rDNA)    Review of Systems:   A comprehensive review of systems was: ENT ROS: negative for - epistaxis, headaches, nasal congestion, nasal polyps, oral lesions, sore throat, vertigo, visual changes or vocal changes  Hematological and Lymphatic ROS: negative for - bleeding problems, blood clots, jaundice, swollen lymph nodes or weight loss  Respiratory ROS: negative for - cough, hemoptysis, orthopnea, shortness of breath, stridor or wheezing  Cardiovascular ROS: negative for - chest pain, irregular heartbeat, murmur or palpitations  Gastrointestinal ROS: negative for - abdominal pain, appetite loss, blood in stools, change in bowel habits, diarrhea, nausea/vomiting or swallowing difficulty/pain  Genito-Urinary ROS: negative for - dysuria, hematuria or urinary frequency/urgency  Musculoskeletal ROS: negative for - joint pain, joint swelling or muscular weakness  Neurological ROS: negative for - confusion, gait disturbance, seizures or weakness  Dermatological ROS: negative for rash and skin lesion changes    Physical Exam:     Vitals:    08/18/16 1437   BP: 131/64   Pulse: 68   Resp:    Temp:    SpO2: 98%       Intake and Output Summary (Last 24 hours) at Date Time  No intake or output data in the 24 hours ending 08/18/16 1525    General appearance - Weak appearing but responsive, no acute respiratory distress   Mental status -  somnolent, follows simple commands, opens mouth moves hands and feet.  Answers simple questions in [place] and to his family   Eyes - pupils equal and reactive, extraocular eye movements intact, sclera anicteric  Ears - bilateral TM's and external ear canals normal  Nose - normal and patent, no erythema, discharge or polyps  Mouth - mucous membranes  dry.   No thrush pharynx normal without lesions  Neck - supple, no significant adenopathy, no  Jugular venous distension  Lymphatics - no palpable lymphadenopathy, no hepatosplenomegaly  Chest - clear to auscultation, no wheezes, rales or rhonchi, symmetric air entry  Heart - normal rate, regular rhythm, normal S1, S2,, 2/6 systolicmurmurs left sternal border.  No  rubs, clicks or gallops  Abdomen - soft, nontender, nondistended, no masses or organomegaly midline surgical scar, well-healed   Rectal - deferred, not clinically indicated  Back exam - full range of motion, no tenderness, palpable spasm or pain on motion  Neurological - alert, oriented, normal speech, no focal findings or movement disorder noted  Musculoskeletal - no joint tenderness, deformity or swelling.  No joint  effusions   Extremities - peripheral pulses normal, no pedal edema, no clubbing or cyanosis  Skin - normal coloration and turgor, no rashes, no suspicious skin lesions noted    Labs:   Recent CBC   Recent Labs  Lab 08/18/16  1234 08/17/16  0604 08/16/16  0623   WBC 11.36* 10.02 9.43   RBC 3.04* 2.95* 2.87*   Hgb 9.2* 9.0* 8.8*   Hematocrit 27.1* 26.0* 25.5*   MCV 89.1 88.1 88.9   Platelets 488* 382 326         Recent Labs  Lab 08/18/16  1234 08/17/16  0604 08/16/16  0623 08/15/16  0735 08/14/16  0252 08/13/16  1312 08/13/16  0646 08/13/16  0115   Sodium 131* 132* 135* 139 140  --   --  133*   Potassium 4.0 4.3 4.5 4.0 3.3*  --   --  4.5   Chloride 98* 102 102 105 109  --   --  102   CO2 22 17* 18* 20* 22  --   --  20*   Glucose 628* 353* 288* 345* 191*  --   --  246*   BUN 95.0* 102.4* 92.9* 84.1* 90.6*  --   --  100.7*   Creatinine 2.5* 2.7* 2.6* 2.8* 2.9*  --   --  4.1*   Magnesium  --   --   --   --  2.3  --   --  2.7*   Phosphorus  --   --   --   --  3.5  --   --  4.3   AST (SGOT) 27  --  53* 25  --   --   --  30   ALT 21  --  22 13  --   --   --  17   Alkaline Phosphatase 117*  --  91 83  --   --   --  69   B-Natriuretic Peptide  --   --   --    --   --   --   --  7,758.8*   TSH  --   --   --   --  0.51  --   --   --    PT INR  --   --   --   --   --   --   --  1.0   PT  --   --   --   --   --   --   --  13.3   PTT  --   --   --   --   --   --   --  26   Creatine Kinase (CK)  --   --   --   --   --  121 66 61   Troponin I 0.22*  --   --   --   --  1.16* 1.24* 1.45*       Rads:     Radiology Results (24 Hour)     Procedure Component Value Units Date/Time    Chest 2 Views [[id number]] Collected:  08/18/16 1321    Order Status:  Completed Updated:  08/18/16 1327    Narrative:       Clinical history: Weakness.    COMPARISON: 08/14/2016.    Chest, AP and lateral: Cardiac pacing hardware is intact and stable in  position. The cardiac silhouette is enlarged but probably stable. The  hilar silhouettes remain within  normal limits. The lungs are grossly  clear.      Impression:        Stable cardiomegaly. Clear lungs.    Nicholes Rough, MD   08/18/2016 1:23 PM      Urinalysis specific gravity 1.012, pH 6, glucose greater than 500, no ketones, negative dipstick  EKG dual paced rhythm, left bundle branch block pattern     I have personally reviewed the patient's history and 24 hour interval events, along with vitals, labs, radiology images  and additional findings found in detail within ICU team notes, with their care plans developed with and reviewed by me.    Time spent in patient evaluation and treatment in critical care excluding procedures, and not overlapping any other providers: 60    minutes.    Signed by: Nino Parsley, MD

## 2016-08-19 ENCOUNTER — Inpatient Hospital Stay: Payer: Medicare Other

## 2016-08-19 DIAGNOSIS — R739 Hyperglycemia, unspecified: Secondary | ICD-10-CM

## 2016-08-19 LAB — GLUCOSE WHOLE BLOOD - POCT
Whole Blood Glucose POCT: 152 mg/dL — ABNORMAL HIGH (ref 70–100)
Whole Blood Glucose POCT: 156 mg/dL — ABNORMAL HIGH (ref 70–100)
Whole Blood Glucose POCT: 159 mg/dL — ABNORMAL HIGH (ref 70–100)
Whole Blood Glucose POCT: 161 mg/dL — ABNORMAL HIGH (ref 70–100)
Whole Blood Glucose POCT: 162 mg/dL — ABNORMAL HIGH (ref 70–100)
Whole Blood Glucose POCT: 175 mg/dL — ABNORMAL HIGH (ref 70–100)
Whole Blood Glucose POCT: 188 mg/dL — ABNORMAL HIGH (ref 70–100)
Whole Blood Glucose POCT: 206 mg/dL — ABNORMAL HIGH (ref 70–100)
Whole Blood Glucose POCT: 217 mg/dL — ABNORMAL HIGH (ref 70–100)
Whole Blood Glucose POCT: 224 mg/dL — ABNORMAL HIGH (ref 70–100)
Whole Blood Glucose POCT: 234 mg/dL — ABNORMAL HIGH (ref 70–100)

## 2016-08-19 LAB — CBC AND DIFFERENTIAL
Absolute NRBC: 0.07 10*3/uL — ABNORMAL HIGH
Basophils Absolute Automated: 0.03 10*3/uL (ref 0.00–0.20)
Basophils Automated: 0.2 %
Eosinophils Absolute Automated: 0.94 10*3/uL — ABNORMAL HIGH (ref 0.00–0.70)
Eosinophils Automated: 6.9 %
Hematocrit: 26.9 % — ABNORMAL LOW (ref 42.0–52.0)
Hgb: 9.3 g/dL — ABNORMAL LOW (ref 13.0–17.0)
Immature Granulocytes Absolute: 0.24 10*3/uL — ABNORMAL HIGH
Immature Granulocytes: 1.8 %
Lymphocytes Absolute Automated: 1.69 10*3/uL (ref 0.50–4.40)
Lymphocytes Automated: 12.5 %
MCH: 30.3 pg (ref 28.0–32.0)
MCHC: 34.6 g/dL (ref 32.0–36.0)
MCV: 87.6 fL (ref 80.0–100.0)
MPV: 10 fL (ref 9.4–12.3)
Monocytes Absolute Automated: 1.01 10*3/uL (ref 0.00–1.20)
Monocytes: 7.4 %
Neutrophils Absolute: 9.65 10*3/uL — ABNORMAL HIGH (ref 1.80–8.10)
Neutrophils: 71.2 %
Nucleated RBC: 0.5 /100 WBC (ref 0.0–1.0)
Platelets: 458 10*3/uL — ABNORMAL HIGH (ref 140–400)
RBC: 3.07 10*6/uL — ABNORMAL LOW (ref 4.70–6.00)
RDW: 15 % (ref 12–15)
WBC: 13.56 10*3/uL — ABNORMAL HIGH (ref 3.50–10.80)

## 2016-08-19 LAB — COMPREHENSIVE METABOLIC PANEL
ALT: 18 U/L (ref 0–55)
AST (SGOT): 29 U/L (ref 5–34)
Albumin/Globulin Ratio: 1 (ref 0.9–2.2)
Albumin: 2.6 g/dL — ABNORMAL LOW (ref 3.5–5.0)
Alkaline Phosphatase: 89 U/L (ref 38–106)
Anion Gap: 6 (ref 5.0–15.0)
BUN: 71.3 mg/dL — ABNORMAL HIGH (ref 9.0–28.0)
Bilirubin, Total: 0.3 mg/dL (ref 0.2–1.2)
CO2: 24 mEq/L (ref 22–29)
Calcium: 8.4 mg/dL (ref 7.9–10.2)
Chloride: 108 mEq/L (ref 100–111)
Creatinine: 1.9 mg/dL — ABNORMAL HIGH (ref 0.7–1.3)
Globulin: 2.5 g/dL (ref 2.0–3.6)
Glucose: 171 mg/dL — ABNORMAL HIGH (ref 70–100)
Potassium: 3.3 mEq/L — ABNORMAL LOW (ref 3.5–5.1)
Protein, Total: 5.1 g/dL — ABNORMAL LOW (ref 6.0–8.3)
Sodium: 138 mEq/L (ref 136–145)

## 2016-08-19 LAB — GFR: EGFR: 33.6

## 2016-08-19 MED ORDER — DILTIAZEM HCL 5 MG/ML IV SOLN (WRAP)
10.0000 mg | Freq: Once | INTRAVENOUS | Status: DC
Start: 2016-08-19 — End: 2016-08-19

## 2016-08-19 MED ORDER — ACETAMINOPHEN 325 MG PO TABS
650.0000 mg | ORAL_TABLET | Freq: Four times a day (QID) | ORAL | Status: DC | PRN
Start: 2016-08-19 — End: 2016-08-23
  Administered 2016-08-19 – 2016-08-23 (×9): 650 mg via ORAL
  Filled 2016-08-19 (×9): qty 2

## 2016-08-19 MED ORDER — INSULIN LISPRO 100 UNIT/ML SC SOLN
1.0000 [IU] | Freq: Every evening | SUBCUTANEOUS | Status: DC | PRN
Start: 2016-08-19 — End: 2016-08-23
  Administered 2016-08-19: 23:00:00 1 [IU] via SUBCUTANEOUS
  Administered 2016-08-20: 22:00:00 2 [IU] via SUBCUTANEOUS
  Administered 2016-08-21 – 2016-08-22 (×2): 3 [IU] via SUBCUTANEOUS
  Filled 2016-08-19 (×2): qty 9
  Filled 2016-08-19: qty 3
  Filled 2016-08-19: qty 6

## 2016-08-19 MED ORDER — INSULIN GLARGINE 100 UNIT/ML SC SOLN
8.0000 [IU] | Freq: Every evening | SUBCUTANEOUS | Status: DC
Start: 2016-08-19 — End: 2016-08-19
  Administered 2016-08-19: 03:00:00 8 [IU] via SUBCUTANEOUS
  Filled 2016-08-19: qty 8

## 2016-08-19 MED ORDER — GLUCAGON 1 MG IJ SOLR (WRAP)
1.0000 mg | INTRAMUSCULAR | Status: DC | PRN
Start: 2016-08-19 — End: 2016-08-23

## 2016-08-19 MED ORDER — GLUCOSE 40 % PO GEL
15.0000 g | ORAL | Status: DC | PRN
Start: 2016-08-19 — End: 2016-08-23

## 2016-08-19 MED ORDER — INSULIN LISPRO 100 UNIT/ML SC SOLN
1.0000 [IU] | Freq: Three times a day (TID) | SUBCUTANEOUS | Status: DC | PRN
Start: 2016-08-19 — End: 2016-08-23
  Administered 2016-08-19: 09:00:00 1 [IU] via SUBCUTANEOUS
  Administered 2016-08-19 (×2): 2 [IU] via SUBCUTANEOUS
  Administered 2016-08-20: 18:00:00 5 [IU] via SUBCUTANEOUS
  Administered 2016-08-20: 14:00:00 3 [IU] via SUBCUTANEOUS
  Administered 2016-08-20: 10:00:00 2 [IU] via SUBCUTANEOUS
  Administered 2016-08-21: 18:00:00 3 [IU] via SUBCUTANEOUS
  Administered 2016-08-21: 10:00:00 1 [IU] via SUBCUTANEOUS
  Administered 2016-08-21 – 2016-08-22 (×2): 3 [IU] via SUBCUTANEOUS
  Administered 2016-08-22: 18:00:00 5 [IU] via SUBCUTANEOUS
  Administered 2016-08-22: 15:00:00 4 [IU] via SUBCUTANEOUS
  Administered 2016-08-23: 13:00:00 2 [IU] via SUBCUTANEOUS
  Administered 2016-08-23: 18:00:00 3 [IU] via SUBCUTANEOUS
  Administered 2016-08-23: 09:00:00 2 [IU] via SUBCUTANEOUS
  Filled 2016-08-19: qty 6
  Filled 2016-08-19: qty 3
  Filled 2016-08-19: qty 15
  Filled 2016-08-19 (×2): qty 6
  Filled 2016-08-19 (×3): qty 9
  Filled 2016-08-19: qty 3
  Filled 2016-08-19: qty 6
  Filled 2016-08-19: qty 12
  Filled 2016-08-19 (×2): qty 9
  Filled 2016-08-19: qty 15
  Filled 2016-08-19: qty 6

## 2016-08-19 MED ORDER — INSULIN GLARGINE 100 UNIT/ML SC SOLN
12.0000 [IU] | Freq: Every evening | SUBCUTANEOUS | Status: DC
Start: 2016-08-19 — End: 2016-08-20
  Administered 2016-08-19: 23:00:00 12 [IU] via SUBCUTANEOUS
  Filled 2016-08-19: qty 12

## 2016-08-19 MED ORDER — POTASSIUM CHLORIDE 20 MEQ PO PACK
20.0000 meq | PACK | Freq: Once | ORAL | Status: AC
Start: 2016-08-19 — End: 2016-08-19
  Administered 2016-08-19: 05:00:00 20 meq via ORAL
  Filled 2016-08-19: qty 1

## 2016-08-19 MED ORDER — DEXTROSE 50 % IV SOLN
12.5000 g | INTRAVENOUS | Status: DC | PRN
Start: 2016-08-19 — End: 2016-08-23

## 2016-08-19 NOTE — Progress Notes (Signed)
Home health referral message left for [facility] at [phone] due to medicare focus dx list

## 2016-08-19 NOTE — Progress Notes (Signed)
[facility] HOSPITALIST  Progress Note  Patient Info:   Date/Time: 08/19/2016 / 5:19 PM   Admit Date:08/18/2016  Patient Name:Marco Bruce   ZDG:[id number]   PCP: Candelaria Celeste, MD  Attending Physician:Mendiguren, Hortencia Conradi, MD     Assessment and Plan:     83 year old with past medical history of diffuse osteoarthritis, diastolic heart failure, chronic kidney disease, discharged this week after being managed for SIRS and acute on chronic kidney failure, returns for hyperglycemia.     # Hyperglycemia  Patient was managed in ICU with insulin drip, required more than 30 u over 24 h    # DM   Increased lantus from 8  to 12 u  Continue with sliding scale     # Leukocytosis - likely due to steroids  Continue ceftriaxone   Follow blood cultures     # Chronic diastolic heart failure with severe MR - stable    # Chronic kidney disease stage 3- at baseline     # Diffuse joint pain/osteoarthritis and gout - improved significantly with steroids    On prednisone 10 mg   Continue allopurinol     # HTN   Continue nifedipine     # HLD   Continue lipitor     DVT Prohylaxis:lovenox   Central Line/Foley Catheter/PICC line status: none   Code Status: Prior  Disposition:home  Type of Admission:Inpatient  Expected Date of Discharge: 1-2 days     Hospital Problems:   Active Problems:    Weakness    Subjective:   08/19/16 Patient feels well and has no complaints.   Chief Complaint:  Social Difficulties    Review of Systems   Constitutional: Negative for chills and fever.   Respiratory: Negative for cough and hemoptysis.    Cardiovascular: Negative for chest pain, palpitations and orthopnea.   Gastrointestinal: Negative for heartburn.   Genitourinary: Negative for dysuria and urgency.   Musculoskeletal: Negative for myalgias.   Skin: Negative for rash.     Objective:     Vitals:    08/19/16 1100 08/19/16 1200 08/19/16 1300 08/19/16 1400   BP: 139/60 139/56 136/52 132/58   Pulse: 77 79 83 80   Resp: 20 18 19 21    Temp:    98.9 F (37.2 C)    TempSrc:    Temporal Artery   SpO2: 97% 98% 96% 100%   Weight:       Height:         Physical Exam:   Physical Exam   Constitutional: He is well-developed, well-nourished, and in no distress. No distress.   HENT:   Head: Normocephalic and atraumatic.   Eyes: Conjunctivae are normal.   Neck: Neck supple. No JVD present.   Cardiovascular: Normal rate and normal heart sounds.  Exam reveals no gallop.    No murmur heard.  Pulmonary/Chest: Effort normal. No respiratory distress. He has no wheezes.   Abdominal: Soft. He exhibits no distension. There is no tenderness. There is no rebound.   Musculoskeletal: He exhibits no edema or tenderness.   Neurological: He is alert.   Skin: Skin is warm and dry. He is not diaphoretic.   Psychiatric: Mood and affect normal.   Vitals reviewed.    Results of Labs/imaging   Labs and radiology reports have been reviewed.    Hospitalist   Signed by:   Marquis Buggy Madline Oesterling  08/19/2016 5:19 PM    *This note was generated by the Epic EMR system/ Dragon speech  recognition and may contain inherent errors or omissions not intended by the user. Grammatical errors, random word insertions, deletions, pronoun errors and incomplete sentences are occasional consequences of this technology due to software limitations. Not all errors are caught or corrected. If there are questions or concerns about the content of this note or information contained within the body of this dictation they should be addressed directly with the author for clarification

## 2016-08-19 NOTE — Progress Notes (Signed)
Transitional Care Management     Writer met with the patient's grandson. Writer provided an introduction and information about the Transitional Care Management (TCM) program and outpatient case manager's role following discharge for education andmanagement; patient verbalized confirmation and understanding. The patient's grandson identified daughter, nicklaus, alviar the primary contact post discharge: identified [phone] as best contact number. Writer provided the patient with a TCM program brochure and writer's contact information for follow-up post discharge. Writer will continue to monitor the patient's discharge plan for appropriateness of TCM program involvement post discharge.    Gae Dry, RN MSN  [facility]  Case Manager  [phone]

## 2016-08-19 NOTE — Plan of Care (Signed)
Problem: Safety  Goal: Patient will be free from injury during hospitalization  Outcome: Progressing   08/19/16 2024   Goal/Interventions addressed this shift   Patient will be free from injury during hospitalization  Assess patient's risk for falls and implement fall prevention plan of care per policy;Provide and maintain safe environment;Ensure appropriate safety devices are available at the bedside;Use appropriate transfer methods;Include patient/ family/ care giver in decisions related to safety;Hourly rounding;Assess for patients risk for elopement and implement [place] per policy;Provide alternative method of communication if needed (communication boards, writing)       Problem: Pain  Goal: Pain at adequate level as identified by patient  Outcome: Progressing   08/19/16 2024   Goal/Interventions addressed this shift   Pain at adequate level as identified by patient Identify patient comfort function goal;Assess for risk of opioid induced respiratory depression, including snoring/sleep apnea. Alert healthcare team of risk factors identified.;Assess pain on admission, during daily assessment and/or before any "as needed" intervention(s);Reassess pain within 30-60 minutes of any procedure/intervention, per Pain Assessment, Intervention, Reassessment (AIR) Cycle;Evaluate if patient comfort function goal is met;Offer non-pharmacological pain management interventions;Evaluate patient's satisfaction with pain management progress;Consult/collaborate with Pain Service;Consult/collaborate with Physical Therapy, Occupational Therapy, and/or Speech Therapy;Include patient/patient care companion in decisions related to pain management as needed

## 2016-08-19 NOTE — Plan of Care (Signed)
Problem: Safety  Goal: Patient will be free from injury during hospitalization  Outcome: Progressing   08/19/16 0100   Goal/Interventions addressed this shift   Patient will be free from injury during hospitalization  Assess patient's risk for falls and implement fall prevention plan of care per policy;Provide and maintain safe environment;Use appropriate transfer methods;Ensure appropriate safety devices are available at the bedside;Include patient/ family/ care giver in decisions related to safety;Hourly rounding;Assess for patients risk for elopement and implement [place] per policy;Provide alternative method of communication if needed (communication boards, writing)       Problem: Nutrition  Goal: Nutritional intake is adequate  Outcome: Progressing   08/19/16 0100   Goal/Interventions addressed this shift   Nutritional intake is adequate Monitor daily weights;Assist patient with meals/food selection;Allow adequate time for meals;Encourage/perform oral hygiene as appropriate;Include patient/patient care companion in decisions related to nutrition       Problem: Diabetes: Glucose Imbalance  Goal: Blood glucose stable at established goal  Outcome: Progressing  Pt remains on insulin drip, currently running at 2u/hr.

## 2016-08-19 NOTE — Progress Notes (Addendum)
08/19/16 0245   Provider Notification   Reason for Communication Evaluate  (BG 162)   Provider Name [facility]   Provider Role eICU Physician   Response See orders   eICU notified for BG 162 per insulin drip protocol. 8 units Lantus ordered to be given now and orders received to keep insulin drip at 0.5units/hr and to stop drip 3 hours after lantus is given. Pt placed on prn sliding scale. Will cont to monitor BG.

## 2016-08-19 NOTE — Progress Notes (Signed)
08/19/16 1218   Patient Type   Within 30 Days of Previous Admission? Yes   Healthcare Decisions   Interviewed: Family   Name of interviewee if other than the pt: ilhan, madan, [phone] cell   Interviewee Contact Information: Taym Twist, grandson, [phone] cell   Orientation/Decision Making Abilities of Patient Confused, unable to make decisions   Advance Directive Patient does not have advance directive   Healthcare Agent Appointed No   Additional Emergency Contacts? Atanacio Melnyk, daughter, [phone] cell    Prior to admission   Prior level of function Ambulates with assistive device;Needs assistance with ADLs   Type of Residence Private residence   Home Layout Multi-level;Stairs to enter with rails (add number in comment);1/2 bath on main level;Performs ADL's on one level  (13 steps outside to enter )   Have running water, electricity, heat, etc? Yes   Living Arrangements Spouse/significant other;Children;Family members   How do you get to your MD appointments? family   How do you get your groceries? family   Who fixes your meals? family or caregiver   Who does your laundry? family or caregiver   Who picks up your prescriptions? family    Dressing Needs assistance   Grooming Independent   Feeding Independent   Bathing Needs assistance   Toileting Needs assistance   DME Currently at [place] point cane;Front wheel walker;Wheelchair-manual;3-in-1 [place] aide   Name of Agency [facility]    Frequency of services Monday thru Sunday (7 days) from 7:00 am to 3:00 pm   Prior SNF admission? (Detail) none   Prior Rehab admission? (Detail) none   Adult Protective Services (APS) involved? No   Discharge Planning   Support Systems Spouse/significant other;Children;Family members;Other (Comment)  (caregiver staff )   Patient expects to be discharged to: home   Anticipated [place] plan discussed with: Same as interviewed;Family   [place] discussion contact information: Bricen Victory,  grandson, [phone] cell   Potential barriers to discharge: Testing/procedure   Mode of transportation: Private car (family member)   Consults/Providers   PT Evaluation Needed 1   OT Evalulation Needed 1   SLP Evaluation Needed 2   Outcome Palliative Care Screen Screened but did not meet criteria for intervention   Correct PCP listed in Epic? Yes   Important Message from [facility] Notice   Patient received 1st IMM Letter? Yes   Date of most recent IMM given: 08/18/16

## 2016-08-19 NOTE — Progress Notes (Signed)
Transitional Care Management    Patient re-admitted within 24 hours. CM will follow admission.     Allean Found, MSW  Case Manager  [facility]  Transitional Care Management  T [phone] F [phone]

## 2016-08-19 NOTE — H&P (Addendum)
Pulmonary Note     Date Time: 08/19/16 9:42 AM  Patient Name: [facility]  Attending Physician: Dolores Lory I, MD    Assessment:   Diabetes mellitus,   chronic kidney disease,   hypertension,   diastolic congestive heart failure,   mitral regurgitation  gout   severe hyperglycemia,   Failure to thrive,     Plan:   Panculture.    Careful IV fluids,   intravenous insulin for glycemic control.    Nephrology follow-up.    Will need physical therapy.    Antibiotics as needed taper steroids  IV Abx   Wean steroids   Decrease IVF'  Watch POx  Labs  Checked  Am orders  Transfer to floors  Orders placed   [facility] rounds           History of Present Illness:   Mcguire Gasparyan is a 83 y.o. [place] born male  Living in the [place] , with diabetes mellitus, gout, chronic kidney disease stage III, hypertension, diastolic congestive heart failure and severe valve disease with severe mitral regurgitation and tricuspid regurgitation.  The patient was discharged yesterday from the hospital after he was admitted with fever, systemic inflammatory response syndrome, demand ischemia, non-STEMI with troponin 1.4 and was given fluids and broad-spectrum antibiotics with good response.       Off insulin drip  Labs checked      Past Medical History:     Past Medical History:   Diagnosis Date   . Congestive heart failure (CHF)    . Diabetes mellitus    . Gout     chronic   . Hypertension    Hospitalized May 2018 for acute on chronic kidney injury and systemic inflammatory response, gout.  History of moderate to severe mitral regurgitation and tricuspid regurgitation.  Hypertension.  Chronic kidney disease.  Creatinine 2.3.  Right renal atrophy   -history of pacemaker placement   Past Surgical History:     Past Surgical History:   Procedure Laterality Date   . ABDOMINAL SURGERY         Family History:   No family history on file.    Social History:     Social History     Social History   . Marital status: Married     Spouse name: N/A   .  Number of children: N/A   . Years of education: N/A     Social History Main Topics   . Smoking status: Never Smoker   . Smokeless tobacco: Never Used   . Alcohol use No      Comment: former alcoholic   . Drug use: No   . Sexual activity: Not on file     Other Topics Concern   . Not on file     Social History Narrative   . No narrative on file       Allergies:   No Known Allergies    Medications:     Prescriptions Prior to Admission   Medication Sig   . allopurinol (ZYLOPRIM) 100 MG tablet Take 1 tablet (100 mg total) by mouth daily.   Marland Kitchen amoxicillin-clavulanate (AUGMENTIN) 500-125 MG per tablet Take 1 tablet by mouth every 12 (twelve) hours.   Marland Kitchen atorvastatin (LIPITOR) 40 MG tablet Take 1 tablet (40 mg total) by mouth every evening.   . chlorthalidone (HYGROTEN) 50 MG tablet TAKE ONE TABLET BY MOUTH EVERY DAY IN THE MORNING WITH FOOD   . cyclobenzaprine (FLEXERIL) 10 MG tablet Take  1 tablet (10 mg total) by mouth every 8 (eight) hours as needed for Muscle spasms.   Marland Kitchen docusate sodium (COLACE) 100 MG capsule Take 100 mg by mouth 2 (two) times daily.   Marland Kitchen gabapentin (NEURONTIN) 100 MG capsule TAKE ONE CAPSULE BY MOUTH EVERY 8 HOURS   . glucose blood (TRUE METRIX BLOOD GLUCOSE TEST) test strip 1 each by Other route 2 (two) times daily. Use as instructed   . insulin glargine 100 UNIT/ML injection pen Inject 8 Units into the skin nightly.   . Insulin Pen Needle 31G X 5 MM Misc INJECT INSULIN DAILY AS DIRECTED   . lactobacillus/streptococcus (RISAQUAD) Cap Take 1 capsule by mouth daily.   Marland Kitchen lidocaine (LIDODERM) 5 % Place 1 patch onto the skin daily.Remove & Discard patch within 12 hours or as directed by MD   . Linagliptin 5 MG Tab Take 5 mg by mouth daily.       Marland Kitchen NIFEdipine (PROCARDIA XL) 60 MG 24 hr tablet TAKE 2 TABLETS BY MOUTH DAILY   . pantoprazole (PROTONIX) 40 MG tablet TAKE ONE TABLET BY MOUTH EVERY DAY   . predniSONE (DELTASONE) 20 MG tablet Take 1 tablet (20 mg total) by mouth daily.   . sodium bicarbonate 650 MG  tablet Take 2 tablets (1,300 mg total) by mouth 2 (two) times daily.     Scheduled Meds:  Current Facility-Administered Medications   Medication Dose Route Frequency   . allopurinol  100 mg Oral Daily   . atorvastatin  40 mg Oral QPM   . cefTRIAXone  1 g Intravenous Q24H SCH   . gabapentin  100 mg Oral TID   . insulin glargine  8 Units Subcutaneous QHS   . lactobacillus/streptococcus  1 capsule Oral Daily   . lidocaine  1 patch Transdermal Daily   . NIFEdipine  60 mg Oral Daily   . pantoprazole  40 mg Oral QAM AC   . predniSONE  10 mg Oral Daily   . sodium bicarbonate  1,300 mg Oral BID     Continuous Infusions:  . sodium chloride 50 mL/hr at 08/18/16 1558   . insulin (regular) infusion Stopped (08/19/16 0611)     PRN Meds:.Nursing communication: Adult Hypoglycemia Treatment Algorithm **AND** dextrose **AND** dextrose **AND** glucagon (rDNA), Nursing communication: Adult Hypoglycemia Treatment Algorithm **AND** dextrose **AND** dextrose **AND** glucagon (rDNA), insulin lispro, insulin lispro    Review of Systems:   A comprehensive review of systems was: ENT ROS: negative for - epistaxis, headaches, nasal congestion, nasal polyps, oral lesions, sore throat, vertigo, visual changes or vocal changes  Hematological and Lymphatic ROS: negative for - bleeding problems, blood clots, jaundice, swollen lymph nodes or weight loss  Respiratory ROS: negative for - cough, hemoptysis, orthopnea, shortness of breath, stridor or wheezing  Cardiovascular ROS: negative for - chest pain, irregular heartbeat, murmur or palpitations  Gastrointestinal ROS: negative for - abdominal pain, appetite loss, blood in stools, change in bowel habits, diarrhea, nausea/vomiting or swallowing difficulty/pain  Genito-Urinary ROS: negative for - dysuria, hematuria or urinary frequency/urgency  Musculoskeletal ROS: negative for - joint pain, joint swelling or muscular weakness  Neurological ROS: negative for - confusion, gait disturbance, seizures or  weakness  Dermatological ROS: negative for rash and skin lesion changes    Physical Exam:     Vitals:    08/19/16 0900   BP: 141/74   Pulse: 75   Resp: 19   Temp:    SpO2: 95%  Intake and Output Summary (Last 24 hours) at Date Time    Intake/Output Summary (Last 24 hours) at 08/19/16 0942  Last data filed at 08/19/16 0606   Gross per 24 hour   Intake            845.6 ml   Output             1300 ml   Net           -454.4 ml       General appearance - Weak appearing but responsive, no acute respiratory distress   Mental status -  somnolent, follows simple commands, opens mouth moves hands and feet.  Answers simple questions in [place] and to his family   Eyes - pupils equal and reactive, extraocular eye movements intact, sclera anicteric  Ears - bilateral TM's and external ear canals normal  Nose - normal and patent, no erythema, discharge or polyps  Mouth - mucous membranes  dry.  No thrush pharynx normal without lesions  Neck - supple, no significant adenopathy, no  Jugular venous distension  Lymphatics - no palpable lymphadenopathy, no hepatosplenomegaly  Chest - clear to auscultation, no wheezes, rales or rhonchi, symmetric air entry  Heart - normal rate, regular rhythm, normal S1, S2,, 2/6 systolicmurmurs left sternal border.  No  rubs, clicks or gallops  Abdomen - soft, nontender, nondistended, no masses or organomegaly midline surgical scar, well-healed   Rectal - deferred, not clinically indicated  Back exam - full range of motion, no tenderness, palpable spasm or pain on motion  Neurological - alert, oriented, normal speech, no focal findings or movement disorder noted  Musculoskeletal - no joint tenderness, deformity or swelling.  No joint effusions   Extremities - peripheral pulses normal, no pedal edema, no clubbing or cyanosis  Skin - normal coloration and turgor, no rashes, no suspicious skin lesions noted    Labs:   Recent CBC     Recent Labs  Lab 08/19/16  0406 08/18/16  1234 08/17/16  0604    WBC 13.56* 11.36* 10.02   RBC 3.07* 3.04* 2.95*   Hgb 9.3* 9.2* 9.0*   Hematocrit 26.9* 27.1* 26.0*   MCV 87.6 89.1 88.1   Platelets 458* 488* 382         Recent Labs  Lab 08/19/16  0406 08/18/16  1234 08/17/16  0604 08/16/16  0623  08/14/16  0252 08/13/16  1312 08/13/16  0646 08/13/16  0115   Sodium 138 131* 132* 135* More results in Results Review 140  --   --  133*   Potassium 3.3* 4.0 4.3 4.5 More results in Results Review 3.3*  --   --  4.5   Chloride 108 98* 102 102 More results in Results Review 109  --   --  102   CO2 24 22 17* 18* More results in Results Review 22  --   --  20*   Glucose 171* 628* 353* 288* More results in Results Review 191*  --   --  246*   BUN 71.3* 95.0* 102.4* 92.9* More results in Results Review 90.6*  --   --  100.7*   Creatinine 1.9* 2.5* 2.7* 2.6* More results in Results Review 2.9*  --   --  4.1*   Magnesium  --   --   --   --   --  2.3  --   --  2.7*   Phosphorus  --   --   --   --   --  3.5  --   --  4.3   AST (SGOT) 29 27  --  53* More results in Results Review  --   --   --  30   ALT 18 21  --  22 More results in Results Review  --   --   --  17   Alkaline Phosphatase 89 117*  --  91 More results in Results Review  --   --   --  69   B-Natriuretic Peptide  --   --   --   --   --   --   --   --  7,758.8*   TSH  --   --   --   --   --  0.51  --   --   --    PT INR  --   --   --   --   --   --   --   --  1.0   PT  --   --   --   --   --   --   --   --  13.3   PTT  --   --   --   --   --   --   --   --  26   Creatine Kinase (CK)  --   --   --   --   --   --  121 66 61   Troponin I  --  0.22*  --   --   --   --  1.16* 1.24* 1.45*   More results in Results Review = values in this interval not displayed.    Rads:     Radiology Results (24 Hour)     Procedure Component Value Units Date/Time    XR Chest AP Portable [[id number]] Collected:  08/19/16 0820    Order Status:  Completed Updated:  08/19/16 0827    Narrative:       Indication: Systemic inflammatory response  syndrome.    Procedure: Portable AP view of the chest.    Comparison: Prior exam May 16.    Findings: There is a left-sided pacemaker generator with leads extending  into the right atrium and right ventricle. Normal cardiac size. Small  basilar opacities are increased from previous exam and are likely due to  atelectasis. Pneumonia is also possible, however. No definite effusion.  No pneumothorax. No evidence of pulmonary edema.      Impression:        Increased left basilar opacities likely due to atelectasis.  Pneumonia is also possible, however.    Roetta Sessions, MD   08/19/2016 8:23 AM    Chest 2 Views [[id number]] Collected:  08/18/16 1321    Order Status:  Completed Updated:  08/18/16 1327    Narrative:       Clinical history: Weakness.    COMPARISON: 08/14/2016.    Chest, AP and lateral: Cardiac pacing hardware is intact and stable in  position. The cardiac silhouette is enlarged but probably stable. The  hilar silhouettes remain within normal limits. The lungs are grossly  clear.      Impression:        Stable cardiomegaly. Clear lungs.    Nicholes Rough, MD   08/18/2016 1:23 PM      Urinalysis specific gravity 1.012, pH 6, glucose greater than 500, no ketones, negative dipstick  EKG dual paced rhythm, left bundle branch block pattern  I have personally reviewed the patient's history and 24 hour interval events, along with vitals, labs, radiology images  and additional findings found in detail within ICU team notes, with their care plans developed with and reviewed by me.    Time spent in patient evaluation and treatment in critical care excluding procedures, and not overlapping any other providers: 60    minutes.    Signed by: Danae Chen, MD

## 2016-08-19 NOTE — UM Notes (Signed)
Pnt presented to ED on 08/18/16 and was transferred to acute care unit as inpatient on 08/18/16    83 yo male was seen by visiting nurse and found to be weak with little responsiveness.  He presented to the emergency room where he was found to have a blood sugar greater than 601, focal neurologic deficits.       Past Medical History:   Diagnosis Date   . Congestive heart failure (CHF)    . Diabetes mellitus    . Gout     chronic   . Hypertension      Past Surgical History:   Procedure Laterality Date   . ABDOMINAL SURGERY       VS: T 97.6, p 68, SATS 97%, resp 18, bp 159/65, pain 5/10    Abnormal Labs: wbc 11.36, hgb 9.2, hct 27.1, platelets 488, rbc 3.04, glucose 628, bun 95, cr 2.5, na 131, chl 98, alk phos 117, trop 0.22,   Urine glucose >500,     Diagnostics:  CXR: Stable cardiomegaly. Clear lungs.    EKG: AV dual-paced rhythm  ABNORMAL ECG  WHEN COMPARED WITH ECG OF 13-Aug-2016 06:10,  VENTRICULAR RATE HAS INCREASED BY  4 BPM    ED Meds: reg insulin iv, nacl iv.     Patient transferred to  acute care unit as inpatient on 08/18/16 for weakness.     Assessment and Plan per MD:  Assessment:   Elderly male with diabetes mellitus, chronic kidney disease, hypertension, diastolic congestive heart failure, mitral regurgitation with admission for systemic inflammatory response syndrome and gout with acute on chronic kidney injury readmitted day after discharge with severe hyperglycemia, weakness, failure to thrive, leukocytosis, rule out recurrent infection, rule out suboptimal diabetic control.  He has persistent renal dysfunction with elevated BUN and creatinine.Hyperglycemia and leukocytosis, probably secondary to prednisone treatment.    Plan:   Panculture.  Careful IV fluids, intravenous insulin for glycemic control.  Nephrology follow-up.  Will need physical therapy.  Antibiotics as needed taper steroids     Orders:  Nacl iv cont 50/hr  Insulin reg cont  Rocephin iv q 24 hrs  Insulin sc  Prednisone po  Na bicarb  po  Cbc  cmp  Consistent carb and cardiac diet

## 2016-08-19 NOTE — Progress Notes (Signed)
Pt transferred to PCU accompanied by grandson. All belongings sent along with pt. RN called Tele room to verify tele connection prior to transfer.

## 2016-08-20 LAB — CBC AND DIFFERENTIAL
Absolute NRBC: 0 10*3/uL
Basophils Absolute Automated: 0.02 10*3/uL (ref 0.00–0.20)
Basophils Automated: 0.2 %
Eosinophils Absolute Automated: 0.7 10*3/uL (ref 0.00–0.70)
Eosinophils Automated: 5.6 %
Hematocrit: 30.8 % — ABNORMAL LOW (ref 42.0–52.0)
Hgb: 10.4 g/dL — ABNORMAL LOW (ref 13.0–17.0)
Immature Granulocytes Absolute: 0.12 10*3/uL — ABNORMAL HIGH
Immature Granulocytes: 1 %
Lymphocytes Absolute Automated: 1.4 10*3/uL (ref 0.50–4.40)
Lymphocytes Automated: 11.2 %
MCH: 30.6 pg (ref 28.0–32.0)
MCHC: 33.8 g/dL (ref 32.0–36.0)
MCV: 90.6 fL (ref 80.0–100.0)
MPV: 10.3 fL (ref 9.4–12.3)
Monocytes Absolute Automated: 0.84 10*3/uL (ref 0.00–1.20)
Monocytes: 6.7 %
Neutrophils Absolute: 9.43 10*3/uL — ABNORMAL HIGH (ref 1.80–8.10)
Neutrophils: 75.3 %
Nucleated RBC: 0 /100 WBC (ref 0.0–1.0)
Platelets: 464 10*3/uL — ABNORMAL HIGH (ref 140–400)
RBC: 3.4 10*6/uL — ABNORMAL LOW (ref 4.70–6.00)
RDW: 16 % — ABNORMAL HIGH (ref 12–15)
WBC: 12.51 10*3/uL — ABNORMAL HIGH (ref 3.50–10.80)

## 2016-08-20 LAB — COMPREHENSIVE METABOLIC PANEL
ALT: 18 U/L (ref 0–55)
AST (SGOT): 26 U/L (ref 5–34)
Albumin/Globulin Ratio: 1.1 (ref 0.9–2.2)
Albumin: 2.7 g/dL — ABNORMAL LOW (ref 3.5–5.0)
Alkaline Phosphatase: 75 U/L (ref 38–106)
Anion Gap: 8 (ref 5.0–15.0)
BUN: 42.2 mg/dL — ABNORMAL HIGH (ref 9.0–28.0)
Bilirubin, Total: 0.4 mg/dL (ref 0.2–1.2)
CO2: 24 mEq/L (ref 22–29)
Calcium: 8.9 mg/dL (ref 7.9–10.2)
Chloride: 107 mEq/L (ref 100–111)
Creatinine: 1.4 mg/dL — ABNORMAL HIGH (ref 0.7–1.3)
Globulin: 2.5 g/dL (ref 2.0–3.6)
Glucose: 172 mg/dL — ABNORMAL HIGH (ref 70–100)
Potassium: 3.9 mEq/L (ref 3.5–5.1)
Protein, Total: 5.2 g/dL — ABNORMAL LOW (ref 6.0–8.3)
Sodium: 139 mEq/L (ref 136–145)

## 2016-08-20 LAB — GLUCOSE WHOLE BLOOD - POCT
Whole Blood Glucose POCT: 169 mg/dL — ABNORMAL HIGH (ref 70–100)
Whole Blood Glucose POCT: 202 mg/dL — ABNORMAL HIGH (ref 70–100)
Whole Blood Glucose POCT: 281 mg/dL — ABNORMAL HIGH (ref 70–100)
Whole Blood Glucose POCT: 353 mg/dL — ABNORMAL HIGH (ref 70–100)
Whole Blood Glucose POCT: 285 mg/dL — ABNORMAL HIGH (ref 70–100)

## 2016-08-20 LAB — GFR: EGFR: 47.8

## 2016-08-20 MED ORDER — INSULIN GLARGINE 100 UNIT/ML SC SOLN
15.0000 [IU] | Freq: Every evening | SUBCUTANEOUS | Status: DC
Start: 2016-08-20 — End: 2016-08-21
  Administered 2016-08-20: 22:00:00 15 [IU] via SUBCUTANEOUS
  Filled 2016-08-20: qty 15

## 2016-08-20 NOTE — Plan of Care (Signed)
Problem: Safety  Goal: Patient will be free from injury during hospitalization  Outcome: Progressing   08/20/16 1355   Goal/Interventions addressed this shift   Patient will be free from injury during hospitalization  Assess patient's risk for falls and implement fall prevention plan of care per policy;Provide and maintain safe environment;Use appropriate transfer methods;Ensure appropriate safety devices are available at the bedside;Hourly rounding;Include patient/ family/ care giver in decisions related to safety

## 2016-08-20 NOTE — Retrospective Coding Query (Signed)
PHYSICIAN'S DOCUMENTATION                                                                      REQUEST                                                                         Date of Request:  08/20/2016  Type of Request:  DOCUMENTATION CLARIFICATION                                         Patient Name: Amari, Zagal  Account #: [id number]  MR #: [id number]  Discharge Date: 08/17/2016      Dear Dr. Karlyne Greenspan,    The medical record reflects the following:    The patient is a 83 year old male admitted with acute kidney failure and acute on chronic diastolic CHF with a Type two MI.  However, the discharge summary hospital course only lists Chronic Diastolic Heart Failure.    Pnote on 08/13/16 states: chronic with mild acute worsening of diastolic heart failure.  No problem.  Was given IV Lasix, but no longer indicated, especially with worsening renal function.      Question to Physician:    Please clarify the severity of heart failure using the information below as a guide.      SEVERITY OF EPISODE:      ____Acute (=first event)   ____Chronic (=maintained on any combination of medications)   ____Acute on Chronic (=exacerbation of chronic condition)       PHYSICIAN RESPONSE:          Hi Greta,   The note states it is mild acute on chronic diastolic failure.          Coder Hetty Ely  Date 08/20/2016

## 2016-08-20 NOTE — Progress Notes (Signed)
Patient was transferred out of the [facility] to the PCU last night.  [facility] is consulted.      PT/OT evaluation is competed and Chemical engineer are recommended.  CM left message for patient's daughter, Marco Bruce, at her Cell [phone] to discuss this information.  CM is waiting to hear back from the family and will follow up as needed.

## 2016-08-20 NOTE — OT Eval Note (Signed)
[facility]  [place]  [phone]    Occupational Therapy Evaluation    Patient: Marco Bruce    MRN#: [id number]     [id number]/[id number]    Time of treatment: Time Calculation  OT Received On: 08/20/16  Start Time: 9476  Stop Time: 0904  Time Calculation (min): 25 min  OT Visit Number: 1    Consult received for [name] for OT Evaluation and Treatment.  Patient's medical condition is appropriate for Occupational therapy intervention at this time.    Assessment:   Marco Bruce is a 83 y.o. male admitted 08/18/2016.   Brief chart review completed including review of labs, review of imaging, review of vitals, calling pt's family/caregiver for thorough history, review of H&P and physician progress notes and review of consulting physician notes .  Pt's ability to complete ADLs and functional transfers is impaired due to the following deficits:  decreased activity tolerance, decreased balance, decreased bed mobility, decreased insight, decreased judgement, decreased safety awareness, decreased strength and transfers .  Pt demonstrates performance deficits with grooming, bathing, dressing, toileting and functional mobility. There are a few comorbidities or other factors that affect plan of care and require modification of task including: assistive device needed for mobility and chronic pain.  Pt would continue to benefit from OT to address these deficits and increase functional independence.    Assessment: decreased strength;balance deficits;decreased independence with ADLs;decreased safety awareness;decreased endurance/activity tolerance;decreased independence with IADLs     Complexity Chart Review Performance Deficits Clinical Decision Making Hx/Comorbidities Assistance needed   Low Brief 1-3 Limited options None None (or at baseline)   Moderate Expanded 3-5 Several Options 1-2 Min/Mod assist (not at baseline)   High Extensive 5 or more Multiple  options 3 or more Max/dependent (not at baseline     Therapy Diagnosis: generalized weakness, decreased functional mobility  and decreased independence with ADL's due to current illness. Without therapy interventions, patient is at risk for falls and decreased independence.    Rehabilitation Potential: Prognosis: Fair;With continued OT s/p acute discharge      Plan:   OT Frequency Recommended: 3-4x/wk   Treatment Interventions: ADL retraining;Functional transfer training;UE strengthening/ROM;Endurance training;Patient/Family training;Neuro muscular reeducation     Patient Goal  Patient Goal:  (No stated goals at this time)    Risks/Benefits/POC Discussed with Pt/Family: With patient    Goals:   Goal Formulation: Patient;Family  Time For Goal Achievement: by time of discharge  ADL Goals  Patient will groom self: Supervision;at edge of bed;5 visits  Patient will dress lower body: 5 visits;Minimal Assist  Mobility and Transfer Goals  Pt will transfer bed to [facility]: Moderate Assist;5 visits                         Discharge Recommendations:   Based on today's session patient's discharge recommendation is the following: Discharge Recommendation: SNF.          Precautions and Contraindications:Falls Risk          Medical Diagnosis: Hyponatremia [E87.1]  Weakness [R53.1]  Hyperglycemia [R73.9]  Renal insufficiency [N28.9]  Elevated troponin [R74.8]  Anemia, unspecified type [D64.9]  Diabetes mellitus of other type with complication, unspecified long term insulin use status [E13.8]    History of Present Illness: Marco Bruce is a 83 y.o. male admitted on 08/18/2016. "The patient was discharged yesterday from the hospital after he was admitted with fever,  systemic inflammatory response syndrome, demand ischemia, non-STEMI with troponin 1.4 and was given fluids and broad-spectrum antibiotics with good response. He was also treated for acute on chronic kidney disease, hyperglycemia, and gout. He was followed by nephrology. The patient  was discharged home on oral Augmentin. Renal function was at baseline with creatinine around 2.3. This morning the patient was seen by visiting nurse and found to be weak with little responsiveness. He presented to the emergency room where he was found to have a blood sugar greater than 601 focal neurologic deficits. He has renal dysfunction. Previous renal ultrasound showed right renal atrophy. At the time of our exam, he is responsive and follows simple commands but has generalized weakness. Vital signs of being stable. Critical care consultation was obtained from the emergency room to the patient's severe hyperglycemia. There is no fever. There is no diarrhea. He is admitted to the intermediate care unit for glycemic control and further workup."--as per H & P Note.   "    Patient Active Problem List   Diagnosis   . SOB (shortness of breath)   . ARF (acute renal failure)   . Pulmonary edema   . Acute pulmonary edema   . Acute renal failure, unspecified acute renal failure type   . CKD (chronic kidney disease), unspecified stage   . Acute diastolic heart failure   . Hospital discharge follow-up   . Peripheral edema   . Pneumonia due to infectious organism, unspecified laterality, unspecified part of lung   . Chronic diastolic heart failure   . CKD (chronic kidney disease) stage 4, GFR 15-29 ml/min   . Chronic pain syndrome   . Arthritis   . Uncontrolled type 2 diabetes with renal manifestation   . Renovascular hypertension   . NSTEMI (non-ST elevated myocardial infarction)   . CHF (congestive heart failure)   . Acute renal failure superimposed on stage 3 chronic kidney disease   . Hyponatremia   . Normocytic anemia   . HTN (hypertension)   . Diabetes mellitus, type II   . OA (osteoarthritis)   . Acute on chronic diastolic congestive heart failure   . Fever and chills   . Weakness        Past Medical/Surgical History:  Past Medical History:   Diagnosis Date   . Congestive heart failure (CHF)    . Diabetes mellitus     . Gout     chronic   . Hypertension       Past Surgical History:   Procedure Laterality Date   . ABDOMINAL SURGERY           X-Rays/Tests/Labs:  Impression:    Increased left basilar opacities likely due to atelectasis.  Pneumonia is also possible, however.    Roetta Sessions, MD   08/19/2016 8:23 AM      Social History:  Prior Level of Function  Prior level of function: Independent with ADLs, Ambulates with assistive device  Assistive Device: Single point cane  Baseline Activity Level: Household ambulation  Driving: does not drive  Cooking: No  DME Currently at Home: Single point cane, Front wheel walker  Home Living Arrangements  Living Arrangements: Family members  Type of Home: Alexis: 1/2 bath on main level, Performs ADL's on one level, Able to live on main level with bedroom/bathroom  Bathroom Shower/Tub: Lawyer: Civil engineer, contracting, Grab bars in shower  DME Currently at Home: Single point cane, Front wheel walker  Home  Living - Notes / Comments: Prior to previous admission. Since d/c earlier this month pt has been bedbound/total care      Subjective:   Patient is agreeable to participation in the therapy session. Nursing clears patient for therapy.  Subjective:  (Patient reporting discomfort in knees and elbows)  Pain Assessment  Pain Assessment:  (c/o discomfort/pain B elbows and knee).        Objective:   Observation of Patient/Vital Signs:  Patient is in bed with telemetry, peripheral IV in place.         Cognition/Neuro Status  Arousal/Alertness: Appropriate responses to stimuli  Attention Span: Appears intact  Orientation Level: Oriented to person (further orientation questions not asked)  Following Commands:  (follows one step commands with increased time and repetition)  Safety Awareness: minimal verbal instruction  Insights: Decreased awareness of deficits  Behavior: calm;cooperative;flat affect  Hand Dominance: right handed    Gross ROM  Right Upper Extremity ROM:  within functional limits  Left Upper Extremity ROM: within functional limits  Gross Strength  Right Upper Extremity Strength:  (grip fair; elbow atleast 3/5; shoulder 3+/5)  Left Upper Extremity Strength:  (grip fair; elbow atleast 3/5; shoulder 3+/5)          Sensory  Auditory: intact  Tactile - Light Touch: intact       Self-care and Home Management  Grooming: Supervision;in chair;supervision/safety;wash/dry hands  LB Dressing: Dependent;in bed;Don/doff R sock;Don/doff L sock    Mobility and Transfers  Supine to Sit: Maximal Assist;Increased Time;Increased Effort;HOB raised  Sit to Stand: Dependent (2 person assist for safety; unable to attain upright posture)  Bed to Chair: Dependent (2 person assist for safety; unable to attain upright posture)     Balance  Static Sitting Balance: fair  Dyanamic Sitting Balance: fair  Static Standing Balance: poor (w/ B UE support)  Dynamic Standing Balance: poor (w/ B UE support)    Participation and Endurance  Participation Effort: fair  Endurance: Tolerates 10 - 20 min exercise with multiple rests    AM-PACT "6 Clicks" Daily Activity Inpatient Short Form  Inpatient AM-PACT Performed?: yes  Put On/Take Off Lower Body Clothing: Total  Assist with Bathing: Total  Assist with Toileting: Total  Put On/Take Off Upper Body Clothing: Total  Assist with Grooming: A little  Assist with Eating: A little  OT Daily Activity Raw Score: 10  CMS 0-100% Score: 74.70%        Treatment Activities: Patient with c/o B elbow and knee discomfort during the session making functional mobility difficult. Patient's family at bedside to translate for Patient as needed. Pt. Instructed to perform B/L UE AROM exercises for shoulder, elbow and digit F/E intermittently throughout the day to increase endurance and strength for ADL's with visual demonstration provided to increase clarity. Patient educated in importance of OOB sitting for all meals to increase endurance/strength for activities and for pulmonary  hygiene. Patient seated in arm-chair at end of session with all needs within reach. Patient instructed to ring for nursing for all needs and appeared receptive to all education provided.       Educated the patient to role of occupational therapy, plan of care, goals of therapy and safety with mobility and ADLs, home safety.    Hewitt Blade. Rigoberto Noel, MS,OTR/L  Pager # [phone]  [phone]

## 2016-08-20 NOTE — SLP Eval Note (Signed)
[facility]  [place], [place]  [phone]    Speech and Language Therapy Bedside Swallow Evaluation    Patient: Marco Bruce    MRN#: [id number]     Time of Treatment: SLP Received On: 08/20/16  Start Time: 1244  Stop Time: 1307  Time Calculation (min): 23 min    Consult received for Marco Bruce for SLP Bedside Swallow Evaluation and Treatment.    Assessment:    Oral phase dysphagia for soft solids secondary to missing dentition.   Oral phase of swallow appears functional for pureed solids and thin liquids.   Pharyngeal phase of swallow appears [facility] for pureed solids and thin liquids without overt clinical s/s aspiration.     Plan/ Recommendations:    Diet change to pureed solids     Follow up treatments: diet tolerance monitoring, strategies training  Duration of Treatment:  (2 sessions)  Diet Solids Recommendation: dysphagia pureed  Diet Liquids Recommendations: thin consistency, from a cup, from a straw  Precautions/Compensations: Awake/alert, Upright 90 degrees for all oral intake, 45 degrees upright after meals, Small bites/sips, Eat/feed slowly  Recommendation Discussed With: : Patient, Nurse, Posted bedside  Recommended Form of Meds: PO, crushed, with puree      Goals to be met at time of discharge:  Pt will demonstrate safe efficient swallow for pureed solids without overt clinical s/s aspiration 5/5 trials x 2 sessions.  Pt will demonstrate safe efficient swallow for thin liquids without overt clinical s/s aspiration 5/5 trials x 2 sessions.      Discharge Recommendations:   Recommendations: SNF      Medical Diagnosis: Hyponatremia [E87.1]  Weakness [R53.1]  Hyperglycemia [R73.9]  Renal insufficiency [N28.9]  Elevated troponin [R74.8]  Anemia, unspecified type [D64.9]  Diabetes mellitus of other type with complication, unspecified long term insulin use status [E13.8]    History of Present Illness: Marco Bruce is a 83 y.o. male admitted  on 08/18/2016 due to weakness with poor responsiveness per home health nurse. Consult ordered to assess swallow function as pt had difficulty taking medication with RN this AM.  Patient Active Problem List   Diagnosis   . SOB (shortness of breath)   . ARF (acute renal failure)   . Pulmonary edema   . Acute pulmonary edema   . Acute renal failure, unspecified acute renal failure type   . CKD (chronic kidney disease), unspecified stage   . Acute diastolic heart failure   . Hospital discharge follow-up   . Peripheral edema   . Pneumonia due to infectious organism, unspecified laterality, unspecified part of lung   . Chronic diastolic heart failure   . CKD (chronic kidney disease) stage 4, GFR 15-29 ml/min   . Chronic pain syndrome   . Arthritis   . Uncontrolled type 2 diabetes with renal manifestation   . Renovascular hypertension   . NSTEMI (non-ST elevated myocardial infarction)   . CHF (congestive heart failure)   . Acute renal failure superimposed on stage 3 chronic kidney disease   . Hyponatremia   . Normocytic anemia   . HTN (hypertension)   . Diabetes mellitus, type II   . OA (osteoarthritis)   . Acute on chronic diastolic congestive heart failure   . Fever and chills   . Weakness        Past Medical/Surgical History:  Past Medical History:   Diagnosis Date   . Congestive heart failure (CHF)    .  Diabetes mellitus    . Gout     chronic   . Hypertension       Past Surgical History:   Procedure Laterality Date   . ABDOMINAL SURGERY           History/Current Status:  History/Current Status  Respiratory Status: room air  Behavior/Mental Status: Awake/alert, Able to follow directions, Cooperative  Nutrition: oral  Diet Prior to Study: mechanical soft, thin liquids      Subjective:   Patient is agreeable to participation in the therapy session. Nursing clears patient for therapy. Patient's medical condition is appropriate for Speech therapy intervention at this time. Language line used for interpretation. (Interpreter  ID #  [id number] Vary )      Objective:   Observation of Patient/Vital Signs:  Patient is seated in a bedside chair with telemetry in place.    Oral Inspection    Mucous membranes: Pink and Moist with firm gums  Comfort: Comfortable  Lips/corners of mouth: Smoth/pink/moist  Tongue: Pink and moist  Teeth/Dentures: Clean/no debris  Silva/Dry Mouth: Watery    Oral Motor Skills  Oral Motor Skills: exceptions to [facility]  Oral Motor Impairments: edentulous    Deglutition Skills  Position: upright 90 degrees  Food(s) Tested: ~ 6 oz thin liquid via straw and cup edge, ~ 3 oz puree - self fed - pt declined soft chewable solids due to missing dentition. He stated he only eats stewed rice at home.   Oral Stage: adequate labial seal, bolus prep and transfer, no oral stasis after the swallow, no anterior leakage  Pharyngeal Stage: laryngeal excursion appears adequate to palpation, swallow appears mildly delayed but adequate; no overt clinical s/s aspiration  Esophageal Stage:  (Not assessed)    Treatment Activities and Patient/Family Education:   Diagnostic treatment was initiated with patient. He was instructed on both general aspiration precautions and specific strategies to use to minimize the risk of aspiration. Strategies included the following which patient was able to demonstrate:   1. Upright 90 degrees for all PO intake  2. Fully awake/alert for meals  3. Pace meal  4. Small single sips of thin liquids only; small bites    Javier Docker M.S. [place]   Pager id [phone]

## 2016-08-20 NOTE — Progress Notes (Signed)
Patient's family very avasive regarding patient's care. Family has not been at bedside. Members of patient's care team have had very limited contact with family members.

## 2016-08-20 NOTE — Plan of Care (Signed)
Problem: Safety  Goal: Patient will be free from injury during hospitalization  Outcome: Progressing   08/20/16 2032   Goal/Interventions addressed this shift   Patient will be free from injury during hospitalization  Assess patient's risk for falls and implement fall prevention plan of care per policy;Provide and maintain safe environment;Use appropriate transfer methods;Ensure appropriate safety devices are available at the bedside;Hourly rounding;Include patient/ family/ care giver in decisions related to safety;Provide alternative method of communication if needed (communication boards, writing);Assess for patients risk for elopement and implement Elopement Risk Plan per policy     Pt is in bed with bed alarm on. Wheels are locked and bed is in the lowest position. Call bell and personal items are within reach. Grandson is at bedside to assist with translation. Instructed pt and grandson to use call bell when assistance is needed.

## 2016-08-20 NOTE — Progress Notes (Signed)
Palliative Care Consultation    Date Time: 08/20/16 1:54 PM  Patient Name: Marco Bruce  Referring physician:  Nino Parsley, MD   Date of Admission: 08/18/2016  Referral Date: 08/20/2016  Consult Date: 08/20/2016  Referral Unit: Medical  Consulting Provider: Verl Dicker, NP  Consulting Service: Palliative Care  Reason for Consultation:     Palliative care consultation was requested by Alric Ran, MD  for assistance with management of clarification of goals of care.  Reason for Consult (Chief Complaint): Goals of Care  Primary Diagnosis: Diabetes mellitus with compliation     Assessment & Plan    History of Presenting Illness   HPI: This is a 83 y.o. male admitted with Hyponatremia [E87.1]  Weakness [R53.1]  Hyperglycemia [R73.9]  Renal insufficiency [N28.9]  Elevated troponin [R74.8]  Anemia, unspecified type [D64.9]  Diabetes mellitus of other type with complication, unspecified long term insulin use status [E13.8]     CC:   PMH: diffuse osteoarthritis, HTN, CHF, CKD, SIRS and acute on chronic kidney failure, gout, failure to thrive, mitral and tricuspid regurgitation.      Patient sitting comfortably in chair at bedside.  Was able to follow mimed commands. Alert.  Contacted daughter by phone.  Daughter did not want to speak on the phone and states she cannot visit. The reason varied between cold and allergies.  She was willing to stay on the phone long enough to confirm code status as full code.  Addressed briefly the implications of full code.  Daughter confirmed that the patient is to Full Code.    PC will attempt to reach out again.      Review of Systems   Unable to perform ROS: Language       Review of Systems & Physical Examination   VS: BP 149/61   Pulse 75   Temp 97.9 F (36.6 C) (Oral)   Resp 16   Ht 1.626 m (5\' 4" )   Wt 71.1 kg (156 lb 11.2 oz)   SpO2 96%   BMI 26.90 kg/m     GENERAL: Appears in no obvious distress.  Constitutional: Anorexia Negative                             Fatigue Positive                                Drowsiness Positive  Eyes: PERRLA. Sclera anicteric.   HENT: Oral mucosa moist  NECK: Trachea midline. Neck veins flat.   Respiratory: No dyspnea, no accessory muscle use.       Cardiovascular:HEART: RRR.S1, S2.   CHEST: Breath sounds are clear bilaterally.  ABDOMEN: Soft. Non-tender. Non-distended. BS+.  Genitourinary:: Deferred.  Rectal: Deferred.  Extremities: No edema. No clubbing. No cyanosis.  Musculoskeletal: Generalized weakness  Skin: No rash or lesion.  PSYCH: Alert.   Neurologic:: Moves all extremities  Psychiatric:: Agitation/Restlessness  Negative                                         ESAS:    1.  Pain  Severity Pain last 24 hours: 6 (08/20/16 1300)                           Pain  Frequency   Pain Frequency: Increases with movement (08/20/16 0952)                       Pain Location        Pain Location: Knee (08/20/16 0938)  Pain Score: 6-moderate pain (08/20/16 0952)  2.    Constipation - Negative        3.    Dyspnea Dyspnea Last 24 hours: 0 (08/20/16 1300)      4.    Nausea Negative        5.    Anorexia Negative        6.    Fatigue Positive  7.   Advance Care Plan - NO  Code Status Full Code  Palliative Performance Scale:    30%   Palliative Care Primary Diagnosis: Other (Comment) (Endicrinology (DM)) (08/20/16 1300)      Assessment Plan: Will attempt to continue [place] with family tomorrow.     Social History:   Tobacco and Alcohol use:  reports that he has never smoked. He has never used smokeless tobacco. He reports that he does not drink alcohol or use drugs.   has no sexual activity history on file.   Occupation: Retired  Marital Status: Married    Living Situation:       Children: Yes  Psychosocial/Spiritual Supports: Family, Buddhist  Advance Directive: NO        Code status: Full Code   Designated agent: Patrcia Dolly      Relationship: Daughter  Contact information: [phone]  Past Medical History:     Past Medical History:   Diagnosis Date   .  Congestive heart failure (CHF)    . Diabetes mellitus    . Gout     chronic   . Hypertension        Past Surgical History:     Past Surgical History:   Procedure Laterality Date   . ABDOMINAL SURGERY         Family History:   No family history on file.    Allergies:   No Known Allergies    Medications:     Current Facility-Administered Medications   Medication Dose Route Frequency   . allopurinol  100 mg Oral Daily   . atorvastatin  40 mg Oral QPM   . cefTRIAXone  1 g Intravenous Q24H SCH   . gabapentin  100 mg Oral TID   . insulin glargine  12 Units Subcutaneous QHS   . lactobacillus/streptococcus  1 capsule Oral Daily   . lidocaine  1 patch Transdermal Daily   . NIFEdipine  60 mg Oral Daily   . pantoprazole  40 mg Oral QAM AC   . predniSONE  10 mg Oral Daily   . sodium bicarbonate  1,300 mg Oral BID     As needed medications include: acetaminophen, Nursing communication: Adult Hypoglycemia Treatment Algorithm **AND** dextrose **AND** dextrose **AND** glucagon (rDNA), Nursing communication: Adult Hypoglycemia Treatment Algorithm **AND** dextrose **AND** dextrose **AND** glucagon (rDNA), insulin lispro, insulin lispro Reviewed in Epic [facility]    Labs/Diagnostics:       Recent Labs  Lab 08/20/16  0730 08/19/16  0406 08/18/16  1234   WBC 12.51* 13.56* 11.36*   Hgb 10.4* 9.3* 9.2*   Hematocrit 30.8* 26.9* 27.1*   Platelets 464* 458* 488*   Sodium 139 138 131*   Potassium 3.9 3.3* 4.0   Chloride 107 108 98*   CO2 24 24  22   BUN 42.2* 71.3* 95.0*   Creatinine 1.4* 1.9* 2.5*   EGFR 47.8 33.6 24.5   Glucose 172* 171* 628*   AST (SGOT) 26 29 27    ALT 18 18 21    Alkaline Phosphatase 75 89 117*   Albumin 2.7* 2.6* 2.8*   Bilirubin, Total 0.4 0.3 0.3     Wt Readings from Last 10 Encounters:   08/20/16 71.1 kg (156 lb 11.2 oz)   08/16/16 68.9 kg (152 lb)   08/08/16 64 kg (141 lb 1.5 oz)   05/26/16 63.5 kg (140 lb)   09/06/15 61.2 kg (135 lb)   08/04/15 61.2 kg (135 lb)   07/23/15 62.1 kg (137 lb)   07/07/15 63.5 kg (140 lb)    07/02/15 63.1 kg (139 lb 3.2 oz)   06/26/15 68.8 kg (151 lb 9.6 oz)      Signed by: Verl Dicker, NP  [place]    [phone]

## 2016-08-20 NOTE — Progress Notes (Addendum)
[facility]  Progress Note  Patient Info:   Date/Time: 08/20/2016 / 2:25 PM   Admit Date:08/18/2016  Patient Name:Marco Bruce   GTX:[id number]   PCP: Candelaria Celeste, MD  Attending Physician:Milania Haubner, Marquis Buggy, MD     Assessment and Plan:     83 year old with past medical history of diffuse osteoarthritis, diastolic heart failure, chronic kidney disease, discharged this week after being managed for SIRS and acute on chronic kidney failure, returns for hyperglycemia.     # Hyperglycemia  Patient was managed in ICU with insulin drip, required more than 30 u over 24 h    # DM   Increasing  lantus to 15 u  Continue with sliding scale     # Leukocytosis - likely due to steroids  On CXR there is suspicion of pneumonia vs atelectasis, with increased left basilar opacities, but clinically he does not exhibit pneumonia   Continue ceftriaxone   Blood cultures - pending     # Chronic diastolic heart failure with severe MR - stable    # Chronic kidney disease stage 3- at baseline     # Diffuse joint pain/osteoarthritis and gout - improved significantly with steroids    On prednisone 10 mg   Continue allopurinol     # HTN   Continue nifedipine     # HLD   Continue lipitor     Called daughter on both cell phone and home number with no answer. Requested palliative consult for long term goals of care and code status.     DVT Prohylaxis:lovenox   Central Line/Foley Catheter/PICC line status: none   Code Status: Full Code  Disposition:home  Type of Admission:Inpatient  Expected Date of Discharge: 1-2 days     Hospital Problems:   Active Problems:    Weakness    Subjective:   08/20/16 Patient feels well and has no complaints.   Chief Complaint:  Social Difficulties    Review of Systems   Constitutional: Negative for chills and fever.   Respiratory: Negative for cough and hemoptysis.    Cardiovascular: Negative for chest pain, palpitations and orthopnea.   Gastrointestinal: Negative for heartburn.   Genitourinary: Negative  for dysuria and urgency.   Musculoskeletal: Negative for myalgias.   Skin: Negative for rash.     Objective:     Vitals:    08/19/16 2139 08/20/16 0155 08/20/16 0529 08/20/16 0952   BP: 145/64 159/68 163/70 149/61   Pulse: 67 (!) 58 69 75   Resp: 18 17 18 16    Temp: 97.7 F (36.5 C) 97 F (36.1 C) 97.9 F (36.6 C) 97.9 F (36.6 C)   TempSrc: Oral   Oral   SpO2: 97% 99% 97% 96%   Weight:   71.1 kg (156 lb 11.2 oz)    Height:         Physical Exam:   Physical Exam   Constitutional: He is well-developed, well-nourished, and in no distress. No distress.   HENT:   Head: Normocephalic and atraumatic.   Eyes: Conjunctivae are normal.   Neck: Neck supple. No JVD present.   Cardiovascular: Normal rate and normal heart sounds.  Exam reveals no gallop.    No murmur heard.  Pulmonary/Chest: Effort normal. No respiratory distress. He has no wheezes.   Abdominal: Soft. He exhibits no distension. There is no tenderness. There is no rebound.   Musculoskeletal: He exhibits no edema or tenderness.   Neurological: He is alert.   Skin: Skin  is warm and dry. He is not diaphoretic.   Psychiatric: Mood and affect normal.   Vitals reviewed.    Results of Labs/imaging   Labs and radiology reports have been reviewed.    Hospitalist   Signed by:   Marquis Buggy Rada Zegers  08/20/2016 2:25 PM    *This note was generated by the Epic EMR system/ Dragon speech recognition and may contain inherent errors or omissions not intended by the user. Grammatical errors, random word insertions, deletions, pronoun errors and incomplete sentences are occasional consequences of this technology due to software limitations. Not all errors are caught or corrected. If there are questions or concerns about the content of this note or information contained within the body of this dictation they should be addressed directly with the author for clarification

## 2016-08-20 NOTE — Plan of Care (Signed)
Problem: Diabetes: Glucose Imbalance  Goal: Blood glucose stable at established goal  Outcome: Progressing   08/20/16 1355   Goal/Interventions addressed this shift   Blood glucose stable at established goal  Monitor lab values;Assess for hypoglycemia /hyperglycemia;Monitor/assess vital signs;Ensure adequate hydration;Ensure appropriate consults are obtained (Nutrition, Diabetes Education, and Case Management)

## 2016-08-20 NOTE — PT Eval Note (Signed)
[facility]  [place], [place]    Department of Rehabilitation  [phone]    Physical Therapy Evaluation    Patient: Marco Bruce    MRN#: [id number]     [id number]    Time of treatment: Time Calculation  PT Received On: 08/20/16  Start Time: 0840  Stop Time: 0903  Time Calculation (min): 23 min    PT Visit Number: 1    Consult received for Marco Bruce for PT Evaluation and Treatment.  Patient's medical condition is appropriate for Physical therapy intervention at this time.      Assessment:   Marco Bruce is a 83 y.o. male admitted 08/18/2016.  Pt's functional mobility is impacted by:  decreased activity tolerance, decreased balance, ROM deficits and decreased strength.  There are a few comorbidities or other factors that affect plan of care and require modification of task including: assistive device needed for mobility and has stairs to manage.  Standardized tests and exams incorporated into evaluation include AMPAC mobility.  Pt demonstrates a stable clinical presentation due to no adverse response to activity.   Pt would continue to benefit from PT to address these deficits and increase functional independence.     Complexity Level Hx and Co  morbidites Examination Clinical Decision Making Clinical Presentation   Low no impact 1-2 elements Limited options Stable   Moderate   1-2 factors 3 or more   Several options Evolving, plan may alter   High 3 or more 4 or more Multiple options Unstable, unpredictable       Impairments: Assessment: Decreased LE ROM;Decreased LE strength;Decreased endurance/activity tolerance;Decreased functional mobility;Decreased balance.     Therapy Diagnosis: generalized weakness, decreased functional mobility  and decreased independence with ADL's due to current illness. Without therapy interventions, patient is at risk for falls, dependence on caregivers for mobility and dependence on caregivers for ADL's.    Rehabilitation Potential: Prognosis: Fair;With  continued PT status post acute discharge      Plan:    Treatment/Interventions: Neuromuscular re-education;Functional transfer training;LE strengthening/ROM;Endurance training;Cognitive reorientation;Patient/family training;Equipment eval/education;Bed mobility PT Frequency: 2-3x/wk    Risks/Benefits/POC Discussed with Pt/Family: With patient/family          Goals:   Goals  Goal Formulation: With patient/family  Time for Goal Acheivement: By time of discharge  Goals: Select goal  Pt Will Go Supine To Sit: with moderate assist;to maximize functional mobility and independence;5 visits  Pt Will Perform Sit to Stand: with moderate assist;to maximize functional mobility and independence;5 visits  Pt Will Transfer Bed/Chair: with rolling walker;with moderate assist;to maximize functional mobility and independence;5 visits      Discharge Recommendations:   Based on today's session patient's discharge recommendation is the following: SNF       Precautions and Contraindications: Fall risk       Medical Diagnosis: Hyponatremia [E87.1]  Weakness [R53.1]  Hyperglycemia [R73.9]  Renal insufficiency [N28.9]  Elevated troponin [R74.8]  Anemia, unspecified type [D64.9]  Diabetes mellitus of other type with complication, unspecified long term insulin use status [E13.8]    History of Present Illness: Marco Bruce is a 83 y.o. male admitted on 08/18/2016 with weakness.    Patient Active Problem List   Diagnosis   . SOB (shortness of breath)   . ARF (acute renal failure)   . Pulmonary edema   . Acute pulmonary edema   . Acute renal failure, unspecified acute renal failure type   . CKD (chronic kidney disease), unspecified stage   .  Acute diastolic heart failure   . Hospital discharge follow-up   . Peripheral edema   . Pneumonia due to infectious organism, unspecified laterality, unspecified part of lung   . Chronic diastolic heart failure   . CKD (chronic kidney disease) stage 4, GFR 15-29 ml/min   . Chronic pain syndrome   . Arthritis   .  Uncontrolled type 2 diabetes with renal manifestation   . Renovascular hypertension   . NSTEMI (non-ST elevated myocardial infarction)   . CHF (congestive heart failure)   . Acute renal failure superimposed on stage 3 chronic kidney disease   . Hyponatremia   . Normocytic anemia   . HTN (hypertension)   . Diabetes mellitus, type II   . OA (osteoarthritis)   . Acute on chronic diastolic congestive heart failure   . Fever and chills   . Weakness        Past Medical/Surgical History:  Past Medical History:   Diagnosis Date   . Congestive heart failure (CHF)    . Diabetes mellitus    . Gout     chronic   . Hypertension       Past Surgical History:   Procedure Laterality Date   . ABDOMINAL SURGERY         Social History:  Prior Level of Function  Prior level of function: Independent with ADLs, Ambulates with assistive device  Assistive Device: Single point cane  Baseline Activity Level: Household ambulation  Driving: does not drive  Cooking: No  DME Currently at Home: Single point cane, Front wheel walker  Home Living Arrangements  Living Arrangements: Family members  Type of Home: [place]: 1/2 bath on main level, Performs ADL's on one level, Able to live on main level with bedroom/bathroom  Bathroom Shower/Tub: Lawyer: Civil engineer, contracting, Grab bars in shower  DME Currently at Home: Single point cane, Front wheel walker  Home Living - Notes / Comments: Prior to previous admission. Since d/c earlier this month pt has been bedbound/total care      Subjective:    Patient is agreeable to participation in the therapy session. Nursing clears patient for therapy. Reports pain in knees due to OA.   Patient Goal  Patient Goal: To feel better       Objective:   Observation of Patient/Vital Signs:  Patient is in bed with telemetry in place.    Cognition/Neuro Status  Arousal/Alertness: Appropriate responses to stimuli  Attention Span: Appears intact  Following Commands: Follows one step commands  without difficulty  Safety Awareness: minimal verbal instruction  Insights: Decreased awareness of deficits  Behavior: calm;cooperative    Sensation: intact light touch BLE    Gross ROM  Right Lower Extremity ROM: within functional limits (Unable to attain full knee ext)  Left Lower Extremity ROM: within functional limits (Unable to attain full knee ext)  Gross Strength  Right Lower Extremity Strength: 3-/5  Left Lower Extremity Strength: 3-/5       Functional Mobility  Supine to Sit: Maximal Assist;HOB raised (Assistance for trunk elevation and LE management)  Scooting to EOB: Dependent  Sit to Stand: Dependent (2 person assist for safety. Unable to attain upright standing. Minimal bottom clearance from bed)  Stand to Sit: Dependent  Transfers  Bed to Chair: Dependent (Unable to attain upright standing, no stepping. Minimal bottom clearance from bed, stand pivot as pt unable to attain standing)        PMP Activity: Step 5 -  Chair     Balance  Balance: needs focused assessment  Sitting - Static: Good  Sitting - Dynamic: Fair  Standing - Static: Poor  Standing - Dynamic: Poor    Participation and Endurance  Participation Effort: good  Endurance: Tolerates 10 - 20 min exercise with multiple rests. No c/o dizziness initially, increased with prolonged EOB sitting. No SOB noted.    AM-PACT Inpatient Short Forms  Inpatient AM-PACT Performed? (PT): Basic Mobility Inpatient Short Form  AM-PACT "6 Clicks" Basic Mobility Inpatient Short Form  Turning Over in Bed: A lot  Sitting Down On/Standing From Armchair: A lot  Lying on Back to Sitting on Side of Bed: A lot  Assist Moving to/from Bed to Chair: Total  Assist to Walk in Hospital Room: Total  Assist to Climb 3-5 Steps with Railing: Total  PT Basic Mobility Raw Score: 9  CMS 0-100% Score: 81.38%    Treatment Activities: Assisted OOB to chair as above with increased assistance due to c/o knee pain and weakness. Educated in and performed AP, LAQ and seated marching and  encouraged to perform LE therex throughout the day to decrease effects of immobility. Encouraged to sit OOB as tolerated for pulmonary hygiene. Instructed not to get up without assistance for safety. Pt verbalized understanding for all education provided.    Educated the patient to role of physical therapy, plan of care, goals of therapy and HEP, safety with mobility and ADLs.    At end of session pt reclined in chair, call bell and items in reach. RN aware. Chair alarm on.      Ladon Applebaum, PT, DPT  Pager #: [phone]

## 2016-08-21 LAB — CBC AND DIFFERENTIAL
Absolute NRBC: 0 10*3/uL
Basophils Absolute Automated: 0.03 10*3/uL (ref 0.00–0.20)
Basophils Automated: 0.3 %
Eosinophils Absolute Automated: 0.6 10*3/uL (ref 0.00–0.70)
Eosinophils Automated: 5.2 %
Hematocrit: 26.3 % — ABNORMAL LOW (ref 42.0–52.0)
Hgb: 8.9 g/dL — ABNORMAL LOW (ref 13.0–17.0)
Immature Granulocytes Absolute: 0.08 10*3/uL — ABNORMAL HIGH
Immature Granulocytes: 0.7 %
Lymphocytes Absolute Automated: 1.47 10*3/uL (ref 0.50–4.40)
Lymphocytes Automated: 12.8 %
MCH: 30.4 pg (ref 28.0–32.0)
MCHC: 33.8 g/dL (ref 32.0–36.0)
MCV: 89.8 fL (ref 80.0–100.0)
MPV: 9.9 fL (ref 9.4–12.3)
Monocytes Absolute Automated: 0.86 10*3/uL (ref 0.00–1.20)
Monocytes: 7.5 %
Neutrophils Absolute: 8.44 10*3/uL — ABNORMAL HIGH (ref 1.80–8.10)
Neutrophils: 73.5 %
Nucleated RBC: 0 /100 WBC (ref 0.0–1.0)
Platelets: 413 10*3/uL — ABNORMAL HIGH (ref 140–400)
RBC: 2.93 10*6/uL — ABNORMAL LOW (ref 4.70–6.00)
RDW: 16 % — ABNORMAL HIGH (ref 12–15)
WBC: 11.48 10*3/uL — ABNORMAL HIGH (ref 3.50–10.80)

## 2016-08-21 LAB — COMPREHENSIVE METABOLIC PANEL
ALT: 14 U/L (ref 0–55)
AST (SGOT): 24 U/L (ref 5–34)
Albumin/Globulin Ratio: 1.1 (ref 0.9–2.2)
Albumin: 2.7 g/dL — ABNORMAL LOW (ref 3.5–5.0)
Alkaline Phosphatase: 69 U/L (ref 38–106)
Anion Gap: 8 (ref 5.0–15.0)
BUN: 39.7 mg/dL — ABNORMAL HIGH (ref 9.0–28.0)
Bilirubin, Total: 0.4 mg/dL (ref 0.2–1.2)
CO2: 24 mEq/L (ref 22–29)
Calcium: 8.7 mg/dL (ref 7.9–10.2)
Chloride: 106 mEq/L (ref 100–111)
Creatinine: 1.5 mg/dL — ABNORMAL HIGH (ref 0.7–1.3)
Globulin: 2.4 g/dL (ref 2.0–3.6)
Glucose: 227 mg/dL — ABNORMAL HIGH (ref 70–100)
Potassium: 3.7 mEq/L (ref 3.5–5.1)
Protein, Total: 5.1 g/dL — ABNORMAL LOW (ref 6.0–8.3)
Sodium: 138 mEq/L (ref 136–145)

## 2016-08-21 LAB — GFR: EGFR: 44.2

## 2016-08-21 LAB — GLUCOSE WHOLE BLOOD - POCT
Whole Blood Glucose POCT: 190 mg/dL — ABNORMAL HIGH (ref 70–100)
Whole Blood Glucose POCT: 272 mg/dL — ABNORMAL HIGH (ref 70–100)
Whole Blood Glucose POCT: 273 mg/dL — ABNORMAL HIGH (ref 70–100)
Whole Blood Glucose POCT: 367 mg/dL — ABNORMAL HIGH (ref 70–100)

## 2016-08-21 MED ORDER — INSULIN GLARGINE 100 UNIT/ML SC SOLN
18.0000 [IU] | Freq: Every evening | SUBCUTANEOUS | Status: DC
Start: 2016-08-21 — End: 2016-08-23
  Administered 2016-08-21 – 2016-08-22 (×2): 18 [IU] via SUBCUTANEOUS
  Filled 2016-08-21 (×2): qty 18

## 2016-08-21 MED ORDER — PREDNISONE 20 MG PO TABS
20.0000 mg | ORAL_TABLET | Freq: Every day | ORAL | Status: DC
Start: 2016-08-21 — End: 2016-08-22
  Administered 2016-08-21 – 2016-08-22 (×2): 20 mg via ORAL
  Filled 2016-08-21 (×2): qty 1

## 2016-08-21 NOTE — Progress Notes (Signed)
Palliative Care Progress Note    Date Time: 08/21/16 5:41 PM  Patient Name: [facility]  Referring physician:  Fernande Boyden, MD   Date of Admission: 08/18/2016  Referral Date: 08/20/2016  Consult Date: 08/21/2016  Referral Unit: Medical  Consulting Provider: Wallace Keller, DO  Consulting Service: Palliative Care  Primary Diagnosis:   SOB (shortness of breath) R06.02     ARF (acute renal failure) N17.9     Acute pulmonary edema J81.0              Assessment & Plan    History of Presenting Illness   OVERVIEW: The patient is a 83 y.o. [place] born male with a PMH of diabetes mellitus, gout, chronic kidney disease stage III, hypertension, diastolic congestive heart failure and severe valve disease with severe mitral regurgitation/ tricuspid regurgitation.  The patient was discharged from the hospital on 08/17/16 after he was treated for fever, systemic inflammatory response syndrome, demand ischemia, non-STEMI  Only to be re-admitted on 08/18/16 weakness, SOB and FTT. Working towards comfort care but the pt's Daughter appears reluctant. Will try for a family meeting Monday.    HPI: This is a 83 y.o. male admitted with Hyponatremia [E87.1]  Weakness [R53.1]  Hyperglycemia [R73.9]  Renal insufficiency [N28.9]  Elevated troponin [R74.8]  Anemia, unspecified type [D64.9]  Diabetes mellitus of other type with complication, unspecified long term insulin use status [E13.8]     PMH: diffuse osteoarthritis, HTN, CHF, CKD, SIRS and acute on chronic kidney failure, gout, failure to thrive, mitral and tricuspid regurgitation.      Review of Systems   Unable to perform ROS: Language       Review of Systems & Physical Examination   VS: BP 157/69   Pulse 76   Temp 98.8 F (37.1 C)   Resp 20   Ht 1.626 m (5\' 4" )   Wt 72.6 kg (160 lb)   SpO2 98%   BMI 27.46 kg/m     GENERAL: Appears tired.  Constitutional: Anorexia Negative                            Fatigue Positive                                Drowsiness Positive  Eyes:  PERRLA. Sclera anicteric.   HENT: Oral mucosa moist  NECK: Trachea midline. Neck veins flat.   Respiratory: No dyspnea, no accessory muscle use.       Cardiovascular:HEART: RRR.S1, S2.   CHEST: Breath sounds are clear bilaterally.  ABDOMEN: Soft. Non-tender. Non-distended. BS+.  Genitourinary:: Deferred.  Rectal: Deferred.  Extremities: No edema. No clubbing. No cyanosis.  Musculoskeletal: Generalized weakness  Skin: No rash or lesion.  PSYCH: Alert.   Neurologic:: Moves all extremities  Psychiatric:: Agitation/Restlessness  Negative                                         ESAS:    1.  Pain  Severity Pain last 24 hours: 6 (08/21/16 1700)                           Pain Frequency   Pain Frequency: Increases with movement (08/21/16 0952)  Pain Location        Pain Location: Knee;Foot;Generalized (08/21/16 2297)  Pain Score: 6-moderate pain (08/21/16 0952)  2.    Constipation - Negative        3.    Dyspnea Dyspnea Last 24 hours: 0 (08/21/16 1700)      4.    Nausea Negative        5.    Anorexia Negative        6.    Fatigue Positive  7.   Advance Care Plan - NO  Code Status Full Code  Palliative Performance Scale:    30%   Palliative Care Primary Diagnosis: Other (Comment) (Endicrinology (DM)) (08/20/16 1300)      Assessment Plan: Will attempt to continue [place] with family tomorrow or Monday.     Social History:   Tobacco and Alcohol use:  reports that he has never smoked. He has never used smokeless tobacco. He reports that he does not drink alcohol or use drugs.   has no sexual activity history on file.   Occupation: Retired  Marital Status: Married    Living Situation:       Children: Yes  Psychosocial/Spiritual Supports: Family, Buddhist  Advance Directive: NO        Code status: Full Code   Designated agent: Patrcia Dolly      Relationship: Daughter  Contact information: [phone]  Past Medical History:     Past Medical History:   Diagnosis Date   . Congestive heart failure (CHF)    . Diabetes  mellitus    . Gout     chronic   . Hypertension        Past Surgical History:     Past Surgical History:   Procedure Laterality Date   . ABDOMINAL SURGERY         Family History:   No family history on file.    Allergies:   No Known Allergies    Medications:     Current Facility-Administered Medications   Medication Dose Route Frequency   . allopurinol  100 mg Oral Daily   . atorvastatin  40 mg Oral QPM   . cefTRIAXone  1 g Intravenous Q24H SCH   . gabapentin  100 mg Oral TID   . insulin glargine  18 Units Subcutaneous QHS   . lactobacillus/streptococcus  1 capsule Oral Daily   . lidocaine  1 patch Transdermal Daily   . NIFEdipine  60 mg Oral Daily   . pantoprazole  40 mg Oral QAM AC   . predniSONE  20 mg Oral Daily   . sodium bicarbonate  1,300 mg Oral BID     As needed medications include: acetaminophen, Nursing communication: Adult Hypoglycemia Treatment Algorithm **AND** dextrose **AND** dextrose **AND** glucagon (rDNA), Nursing communication: Adult Hypoglycemia Treatment Algorithm **AND** dextrose **AND** dextrose **AND** glucagon (rDNA), insulin lispro, insulin lispro Reviewed in Epic [facility]    Labs/Diagnostics:       Recent Labs  Lab 08/21/16  0839 08/20/16  0730 08/19/16  0406   WBC 11.48* 12.51* 13.56*   Hgb 8.9* 10.4* 9.3*   Hematocrit 26.3* 30.8* 26.9*   Platelets 413* 464* 458*   Sodium 138 139 138   Potassium 3.7 3.9 3.3*   Chloride 106 107 108   CO2 24 24 24    BUN 39.7* 42.2* 71.3*   Creatinine 1.5* 1.4* 1.9*   EGFR 44.2 47.8 33.6   Glucose 227* 172* 171*   AST (SGOT) 24  26 29   ALT 14 18 18    Alkaline Phosphatase 69 75 89   Albumin 2.7* 2.7* 2.6*   Bilirubin, Total 0.4 0.4 0.3     Wt Readings from Last 10 Encounters:   08/21/16 72.6 kg (160 lb)   08/16/16 68.9 kg (152 lb)   08/08/16 64 kg (141 lb 1.5 oz)   05/26/16 63.5 kg (140 lb)   09/06/15 61.2 kg (135 lb)   08/04/15 61.2 kg (135 lb)   07/23/15 62.1 kg (137 lb)   07/07/15 63.5 kg (140 lb)   07/02/15 63.1 kg (139 lb 3.2 oz)   06/26/15 68.8 kg (151 lb  9.6 oz)      Signed by: Wallace Keller, [place]    [phone]

## 2016-08-21 NOTE — Plan of Care (Signed)
Problem: Safety  Goal: Patient will be free from injury during hospitalization  Outcome: Progressing   08/21/16 2015   Goal/Interventions addressed this shift   Patient will be free from injury during hospitalization  Assess patient's risk for falls and implement fall prevention plan of care per policy;Ensure appropriate safety devices are available at the bedside;Use appropriate transfer methods;Provide and maintain safe environment;Include patient/ family/ care giver in decisions related to safety;Hourly rounding;Assess for patients risk for elopement and implement [place] per policy;Provide alternative method of communication if needed (communication boards, writing)     Pt is in bed with bed alarm on. Wheels are locked and bed is in lowest position. Call bell and personal items are within reach. Instructed pt to use call bell when assistance is needed. Yolanda Bonine is at bedside.

## 2016-08-21 NOTE — Plan of Care (Signed)
Problem: Safety  Goal: Patient will be free from injury during hospitalization  Outcome: Progressing   08/21/16 1439   Goal/Interventions addressed this shift   Patient will be free from injury during hospitalization  Assess patient's risk for falls and implement fall prevention plan of care per policy;Provide and maintain safe environment;Use appropriate transfer methods;Ensure appropriate safety devices are available at the bedside;Include patient/ family/ care giver in decisions related to safety;Hourly rounding;Assess for patients risk for elopement and implement Elopement Risk Plan per policy;Provide alternative method of communication if needed (communication boards, writing)       Problem: Fluid and Electrolyte Imbalance/ Endocrine  Goal: Fluid and electrolyte balance are achieved/maintained  Outcome: Progressing   08/21/16 1439   Goal/Interventions addressed this shift   Fluid and electrolyte balance are achieved/maintained Monitor intake and output every shift;Monitor/assess lab values and report abnormal values;Assess for confusion/personality changes;Provide adequate hydration;Monitor daily weight;Assess and reassess fluid and electrolyte status;Observe for seizure activity and initiate seizure precautions if indicated;Monitor for muscle weakness;Observe for cardiac arrhythmias     Goal: Adequate hydration  Outcome: Progressing   08/21/16 1439   Goal/Interventions addressed this shift   Adequate hydration Assess mucus membranes, skin color, turgor, perfusion and presence of edema;Assess for peripheral, sacral, periorbital and abdominal edema;Monitor and assess vital signs and perfusion       Problem: Diabetes: Glucose Imbalance  Goal: Blood glucose stable at established goal  Outcome: Progressing   08/21/16 1439   Goal/Interventions addressed this shift   Blood glucose stable at established goal  Monitor lab values;Monitor intake and output. Notify LIP if urine output is < 30 mL/hour.;Follow fluid  restrictions/IV/PO parameters;Include patient/family in decisions related to nutrition/dietary selections;Assess for hypoglycemia /hyperglycemia;Monitor/assess vital signs;Coordinate medication administration with meals, as indicated;Ensure adequate hydration;Ensure appropriate diet and assess tolerance;Ensure appropriate consults are obtained (Nutrition, Diabetes Education, and Case Management);Ensure patient/family has adequate teaching materials

## 2016-08-21 NOTE — Progress Notes (Signed)
[facility]  Progress Note  Patient Info:   Date/Time: 08/21/2016 / 1:13 PM   Admit Date:08/18/2016  Patient Name:Marco Bruce   ZMC:[id number]   PCP: Candelaria Celeste, MD  Attending Physician:Garrett Mitchum, Marquis Buggy, MD     Assessment and Plan:     83 year old with past medical history of diffuse osteoarthritis, diastolic heart failure, chronic kidney disease, discharged this week after being managed for SIRS and acute on chronic kidney failure, returns for hyperglycemia.     # Hyperglycemia  Patient was managed in ICU with insulin drip, required more than 30 u over 24 h    # DM - glucose consistenly above 200  Increasing  lantus to 18 u  Continue with sliding scale     # Polymyalgia rheumatica  During previous admission patient presented with b/l shoulder and hip pain and decreased ROM, which have significantly improved after taking steroids. However he is still complaining of b/l knee pain and not being able to walk because of that.   Will increase prednisone to 20 mg     # Leukocytosis - likely due to steroids  On CXR there is suspicion of pneumonia vs atelectasis, with increased left basilar opacities, but clinically he does not exhibit pneumonia. Patient has received a total of 8 continuous days of antibiotics from previous admission to this one. During initial admission he presented with SIRS , but there was no clear source on infection.   Blood cultures - no growth to date    # Chronic diastolic heart failure with severe MR - stable    # Chronic kidney disease stage 3- at baseline     # Gout -   Continue allopurinol     # HTN   Continue nifedipine     # HLD   Continue lipitor     Discussed with grandson at bedside. His mother is the one taking medical decisions, but she is ill at the moment and not able to come to the hospital.     DVT Prohylaxis:lovenox   Central Line/Foley Catheter/PICC line status: none   Code Status: Full Code  Disposition:home  Type of Admission:Inpatient  Expected Date of Discharge:  1-2 days     Hospital Problems:   Active Problems:    Weakness    Subjective:   08/21/16 Patient feels well and has no complaints.   Chief Complaint:  Social Difficulties    Review of Systems   Constitutional: Negative for chills and fever.   Respiratory: Negative for cough and hemoptysis.    Cardiovascular: Negative for chest pain, palpitations and orthopnea.   Gastrointestinal: Negative for heartburn.   Genitourinary: Negative for dysuria and urgency.   Musculoskeletal: Negative for myalgias.   Skin: Negative for rash.     Objective:     Vitals:    08/20/16 2123 08/21/16 0124 08/21/16 0521 08/21/16 0952   BP: 135/64 164/78 173/76 161/69   Pulse: 68 75 73 67   Resp: 20 20 20 15    Temp: 97.2 F (36.2 C) 97.3 F (36.3 C) 97.3 F (36.3 C) 98.2 F (36.8 C)   TempSrc: Temporal Artery Temporal Artery Temporal Artery Oral   SpO2: 96% 97% 98% 99%   Weight:   72.6 kg (160 lb)    Height:         Physical Exam:   Physical Exam   Constitutional: He is well-developed, well-nourished, and in no distress. No distress.   HENT:   Head: Normocephalic and atraumatic.  Eyes: Conjunctivae are normal.   Neck: Neck supple. No JVD present.   Cardiovascular: Normal rate and normal heart sounds.  Exam reveals no gallop.    No murmur heard.  Pulmonary/Chest: Effort normal. No respiratory distress. He has no wheezes.   Abdominal: Soft. He exhibits no distension. There is no tenderness. There is no rebound.   Musculoskeletal: He exhibits no edema or tenderness.   Neurological: He is alert.   Skin: Skin is warm and dry. He is not diaphoretic.   Psychiatric: Mood and affect normal.   Vitals reviewed.    Results of Labs/imaging   Labs and radiology reports have been reviewed.    Hospitalist   Signed by:   Marquis Buggy Doniesha Landau  08/21/2016 1:13 PM    *This note was generated by the Epic EMR system/ Dragon speech recognition and may contain inherent errors or omissions not intended by the user. Grammatical errors, random word insertions, deletions,  pronoun errors and incomplete sentences are occasional consequences of this technology due to software limitations. Not all errors are caught or corrected. If there are questions or concerns about the content of this note or information contained within the body of this dictation they should be addressed directly with the author for clarification

## 2016-08-22 LAB — CBC
Absolute NRBC: 0 x10 3/uL
Hematocrit: 26 % — ABNORMAL LOW (ref 42.0–52.0)
Hgb: 8.6 g/dL — ABNORMAL LOW (ref 13.0–17.0)
MCH: 29.9 pg (ref 28.0–32.0)
MCHC: 33.1 g/dL (ref 32.0–36.0)
MCV: 90.3 fL (ref 80.0–100.0)
MPV: 10.4 fL (ref 9.4–12.3)
Nucleated RBC: 0 /100{WBCs} (ref 0.0–1.0)
Platelets: 382 x10 3/uL (ref 140–400)
RBC: 2.88 x10 6/uL — ABNORMAL LOW (ref 4.70–6.00)
RDW: 16 % — ABNORMAL HIGH (ref 12–15)
WBC: 8.82 x10 3/uL (ref 3.50–10.80)

## 2016-08-22 LAB — GLUCOSE WHOLE BLOOD - POCT
Whole Blood Glucose POCT: 251 mg/dL — ABNORMAL HIGH (ref 70–100)
Whole Blood Glucose POCT: 381 mg/dL — ABNORMAL HIGH (ref 70–100)

## 2016-08-22 LAB — BASIC METABOLIC PANEL
Anion Gap: 8 (ref 5.0–15.0)
BUN: 34.4 mg/dL — ABNORMAL HIGH (ref 9.0–28.0)
CO2: 24 mEq/L (ref 22–29)
Calcium: 8.4 mg/dL (ref 7.9–10.2)
Chloride: 105 mEq/L (ref 100–111)
Creatinine: 1.4 mg/dL — ABNORMAL HIGH (ref 0.7–1.3)
Glucose: 302 mg/dL — ABNORMAL HIGH (ref 70–100)
Potassium: 3.9 mEq/L (ref 3.5–5.1)
Sodium: 137 mEq/L (ref 136–145)

## 2016-08-22 LAB — WHOLE BLOOD GLUCOSE POCT
Whole Blood Glucose POCT: 305 mg/dL — ABNORMAL HIGH (ref 70–100)
Whole Blood Glucose POCT: 361 mg/dL — ABNORMAL HIGH (ref 70–100)

## 2016-08-22 LAB — GFR: EGFR: 47.8

## 2016-08-22 MED ORDER — PREDNISONE 10 MG PO TABS
10.0000 mg | ORAL_TABLET | Freq: Every day | ORAL | Status: DC
Start: 2016-08-23 — End: 2016-08-23
  Administered 2016-08-23: 10:00:00 10 mg via ORAL
  Filled 2016-08-22: qty 1

## 2016-08-22 NOTE — Progress Notes (Addendum)
SNF referrals sent in [employer] to [facility]. Ready for [place] Monday.        Kylyn Sookram E. Jovanni Rash, MSW  [facility] Dis[facility] [place]  P  [phone]

## 2016-08-22 NOTE — Plan of Care (Signed)
Problem: Safety  Goal: Patient will be free from injury during hospitalization  Outcome: Progressing   08/22/16 1650   Goal/Interventions addressed this shift   Patient will be free from injury during hospitalization  Assess patient's risk for falls and implement fall prevention plan of care per policy;Provide and maintain safe environment;Use appropriate transfer methods;Ensure appropriate safety devices are available at the bedside;Include patient/ family/ care giver in decisions related to safety;Hourly rounding;Assess for patients risk for elopement and implement Elopement Risk Plan per policy;Provide alternative method of communication if needed (communication boards, writing)       Problem: Pain  Goal: Pain at adequate level as identified by patient  Outcome: Progressing   08/22/16 1650   Goal/Interventions addressed this shift   Pain at adequate level as identified by patient Identify patient comfort function goal;Assess for risk of opioid induced respiratory depression, including snoring/sleep apnea. Alert healthcare team of risk factors identified.;Assess pain on admission, during daily assessment and/or before any "as needed" intervention(s);Reassess pain within 30-60 minutes of any procedure/intervention, per Pain Assessment, Intervention, Reassessment (AIR) Cycle;Evaluate if patient comfort function goal is met;Evaluate patient's satisfaction with pain management progress;Offer non-pharmacological pain management interventions;Consult/collaborate with Physical Therapy, Occupational Therapy, and/or Speech Therapy;Include patient/patient care companion in decisions related to pain management as needed       Problem: Nutrition  Goal: Nutritional intake is adequate  Outcome: Progressing   08/22/16 1650   Goal/Interventions addressed this shift   Nutritional intake is adequate Monitor daily weights;Assist patient with meals/food selection;Allow adequate time for meals;Encourage/perform oral hygiene as  appropriate;Encourage/administer dietary supplements as ordered (i.e. tube feed, TPN, oral, OGT/NGT, supplements);Include patient/patient care companion in decisions related to nutrition;Assess anorexia, appetite, and amount of meal/food tolerated     Goal: Food and/or nutrient delivery  Outcome: Progressing   08/22/16 1650   Goal/Interventions addressed this shift   Food and/or nutrient delivery  Feeding assistance;Nutrition related medication management;Provide a pleasant environment during mealtime       Problem: Diabetes: Glucose Imbalance  Goal: Blood glucose stable at established goal  Outcome: Progressing   08/22/16 1650   Goal/Interventions addressed this shift   Blood glucose stable at established goal  Monitor lab values;Monitor intake and output. Notify LIP if urine output is < 30 mL/hour.;Follow fluid restrictions/IV/PO parameters;Include patient/family in decisions related to nutrition/dietary selections;Assess for hypoglycemia /hyperglycemia;Monitor/assess vital signs;Coordinate medication administration with meals, as indicated;Ensure appropriate diet and assess tolerance;Ensure adequate hydration;Ensure appropriate consults are obtained (Nutrition, Diabetes Education, and Case Management);Ensure patient/family has adequate teaching materials

## 2016-08-22 NOTE — Progress Notes (Signed)
[facility]  Progress Note  Patient Info:   Date/Time: 08/22/2016 / 1:15 PM   Admit Date:08/18/2016  Patient Name:Marco Bruce   FEO:[id number]   PCP: Candelaria Celeste, MD  Attending Physician:Zakary Kimura, Marquis Buggy, MD     Assessment and Plan:     83 year old with past medical history of diffuse osteoarthritis, diastolic heart failure, chronic kidney disease, discharged this week after being managed for SIRS and acute on chronic kidney failure, returns for hyperglycemia.     # Hyperglycemia  Patient was managed in ICU with insulin drip, required more than 30 u over 24 h    # DM - glucose consistenly above 200, he is on steroids as well   Lantus to 18 u  Continue with sliding scale     # Polymyalgia rheumatica  During previous admission patient presented with b/l shoulder and hip pain and decreased ROM, which have significantly improved after taking steroids.  Knee pain had subsided , today only complaining of b/l feet pain    Tomorrow will taper the prednisone to 10 mg   Continue PT/OT    # Leukocytosis - likely due to steroids- resolved   On CXR there is suspicion of pneumonia vs atelectasis, with increased left basilar opacities, but clinically he does not exhibit pneumonia. Patient has received a total of 8 continuous days of antibiotics from previous admission to this one. During initial admission he presented with SIRS , but there was no clear source on infection.   Blood cultures - no growth to date    # Chronic diastolic heart failure with severe MR - stable    # Chronic kidney disease stage 3- at baseline     # Gout -   Continue allopurinol     # HTN   Continue nifedipine     # HLD   Continue lipitor     Discussed with grandson at bedside, plan is to discharge him to SNF.   Involved palliative team on the case, they planned a family meeting for Monday.       DVT Prohylaxis:lovenox   Central Line/Foley Catheter/PICC line status: none   Code Status: Full Code  Disposition:home  Type of  Admission:Inpatient  Expected Date of Discharge: tomorrow     Hospital Problems:   Active Problems:    Weakness    Subjective:   08/22/16 Patient feels well and has no complaints.   Chief Complaint:  Social Difficulties    Review of Systems   Constitutional: Negative for chills and fever.   Respiratory: Negative for cough and hemoptysis.    Cardiovascular: Negative for chest pain, palpitations and orthopnea.   Gastrointestinal: Negative for heartburn.   Genitourinary: Negative for dysuria and urgency.   Musculoskeletal: Negative for myalgias.   Skin: Negative for rash.     Objective:     Vitals:    08/22/16 0125 08/22/16 0514 08/22/16 0907 08/22/16 1309   BP: 129/85 154/61 171/75 146/65   Pulse: 71 68 66 69   Resp: 17 18 18 17    Temp: 97.2 F (36.2 C) 97.3 F (36.3 C) 98.1 F (36.7 C) 97.9 F (36.6 C)   TempSrc: Temporal Artery Temporal Artery     SpO2: 97% 99% 99% 97%   Weight:  72.8 kg (160 lb 6.4 oz)     Height:         Physical Exam:   Physical Exam   Constitutional: He is well-developed, well-nourished, and in no distress. No distress.  HENT:   Head: Normocephalic and atraumatic.   Eyes: Conjunctivae are normal.   Neck: Neck supple. No JVD present.   Cardiovascular: Normal rate and normal heart sounds.  Exam reveals no gallop.    No murmur heard.  Pulmonary/Chest: Effort normal. No respiratory distress. He has no wheezes.   Abdominal: Soft. He exhibits no distension. There is no tenderness. There is no rebound.   Musculoskeletal: He exhibits no edema or tenderness.   Neurological: He is alert.   Skin: Skin is warm and dry. He is not diaphoretic.   Psychiatric: Mood and affect normal.   Vitals reviewed.    Results of Labs/imaging   Labs and radiology reports have been reviewed.    Hospitalist   Signed by:   Marquis Buggy Marquette Piontek  08/22/2016 1:15 PM    *This note was generated by the Epic EMR system/ Dragon speech recognition and may contain inherent errors or omissions not intended by the user. Grammatical  errors, random word insertions, deletions, pronoun errors and incomplete sentences are occasional consequences of this technology due to software limitations. Not all errors are caught or corrected. If there are questions or concerns about the content of this note or information contained within the body of this dictation they should be addressed directly with the author for clarification

## 2016-08-23 LAB — GLUCOSE WHOLE BLOOD - POCT
Whole Blood Glucose POCT: 235 mg/dL — ABNORMAL HIGH (ref 70–100)
Whole Blood Glucose POCT: 222 mg/dL — ABNORMAL HIGH (ref 70–100)
Whole Blood Glucose POCT: 268 mg/dL — ABNORMAL HIGH (ref 70–100)

## 2016-08-23 MED ORDER — INSULIN GLARGINE 100 UNIT/ML SC SOLN
20.0000 [IU] | Freq: Every evening | SUBCUTANEOUS | Status: DC
Start: 2016-08-23 — End: 2016-08-23

## 2016-08-23 MED ORDER — CYCLOBENZAPRINE HCL 10 MG PO TABS
10.0000 mg | ORAL_TABLET | Freq: Three times a day (TID) | ORAL | 3 refills | Status: DC | PRN
Start: 2016-08-23 — End: 2016-10-01

## 2016-08-23 MED ORDER — INSULIN LISPRO 100 UNIT/ML SC SOLN
1.0000 [IU] | Freq: Three times a day (TID) | SUBCUTANEOUS | Status: DC | PRN
Start: 2016-08-23 — End: 2017-01-04

## 2016-08-23 MED ORDER — INSULIN LISPRO 100 UNIT/ML SC SOLN
1.0000 [IU] | Freq: Every evening | SUBCUTANEOUS | Status: DC | PRN
Start: 2016-08-23 — End: 2017-05-28

## 2016-08-23 MED ORDER — INSULIN GLARGINE 100 UNIT/ML SC SOLN
20.0000 [IU] | Freq: Every evening | SUBCUTANEOUS | Status: DC
Start: 2016-08-23 — End: 2016-12-01

## 2016-08-23 MED ORDER — PREDNISONE 10 MG PO TABS
10.0000 mg | ORAL_TABLET | Freq: Every day | ORAL | Status: DC
Start: 2016-08-24 — End: 2016-09-25

## 2016-08-23 NOTE — Progress Notes (Signed)
Daughter agreeable for SNF placement for patient. Daughter prefers [place] area. [facility] offered bed for patient. Daughter agreeable for transfer. PTS set for next available 1900. Nurse updated.          08/23/16 1632   Discharge Disposition   Patient preference/choice provided? Yes   Physical Discharge Disposition SNF   Receiving facility, unit and room number: [facility] room 126   Nursing report phone number: [phone]   Facility fax number: [phone]   Mode of Transportation Ambulance   Pick up time 7pm   Patient/Family/POA notified of transfer plan Yes   Patient agreeable to discharge plan/expected d/c date? Yes   Family/POA agreeable to discharge plan/expected d/c date? Yes   Bedside nurse notified of transport plan? Yes   Hard copy of narcotic RX sent with patient? Yes   Hard copy of DNR/Advance Directive sent with patient? N/A   Advanced directive/code status rescreen completed before discharge?  No   IV antibiotics post discharge? N/A   Wound care post discharge? N/A   CM Interventions   Follow up appointment scheduled? No   Reason no follow up scheduled? Other (comment)  (d/c to SNF)   Referral made for home health RN visit? No, Other (comment)  (d/c to SNF)   Multidisciplinary rounds/family meeting before d/c? Yes   Medicare Checklist   Is this a Medicare patient? Yes   Patient received 1st IMM Letter? Yes   3 midnight inpatient qualifying stay (SNF only) Yes   Medicaid/DMAS   Medicaid Pre-Screening completed No   DMAS 95 MI/MR completed and faxed Yes   DMAS 95 MI/MR trigger Level 2 Screening? No

## 2016-08-23 NOTE — Plan of Care (Signed)
Problem: Safety  Goal: Patient will be free from injury during hospitalization  Outcome: Progressing   08/23/16 1319   Goal/Interventions addressed this shift   Patient will be free from injury during hospitalization  Assess patient's risk for falls and implement fall prevention plan of care per policy;Provide and maintain safe environment;Use appropriate transfer methods;Ensure appropriate safety devices are available at the bedside;Include patient/ family/ care giver in decisions related to safety;Hourly rounding;Assess for patients risk for elopement and implement [place] per policy;Provide alternative method of communication if needed (communication boards, writing)       Problem: Pain  Goal: Pain at adequate level as identified by patient  Outcome: Progressing   08/23/16 1319   Goal/Interventions addressed this shift   Pain at adequate level as identified by patient Identify patient comfort function goal;Assess for risk of opioid induced respiratory depression, including snoring/sleep apnea. Alert healthcare team of risk factors identified.;Assess pain on admission, during daily assessment and/or before any "as needed" intervention(s);Reassess pain within 30-60 minutes of any procedure/intervention, per Pain Assessment, Intervention, Reassessment (AIR) Cycle;Evaluate patient's satisfaction with pain management progress;Evaluate if patient comfort function goal is met;Offer non-pharmacological pain management interventions;Consult/collaborate with Physical Therapy, Occupational Therapy, and/or Speech Therapy;Include patient/patient care companion in decisions related to pain management as needed       Problem: Compromised Tissue integrity  Goal: Damaged tissue is healing and protected  Outcome: Progressing   08/23/16 1319   Goal/Interventions addressed this shift   Damaged tissue is healing and protected  Monitor/assess Braden scale every shift;Provide wound care per wound care algorithm;Reposition  patient every 2 hours and as needed unless able to reposition self;Increase activity as tolerated/progressive mobility;Relieve pressure to bony prominences for patients at moderate and high risk;Keep intact skin clean and dry;Use bath wipes, not soap and water, for daily bathing;Avoid shearing injuries;Use incontinence wipes for cleaning urine, stool and caustic drainage. Foley care as needed;Monitor external devices/tubes for correct placement to prevent pressure, friction and shearing;Encourage use of lotion/moisturizer on skin;Monitor patient's hygiene practices;Utilize specialty bed;Consult/collaborate with wound care nurse       Problem: Nutrition  Goal: Nutritional intake is adequate  Outcome: Progressing   08/23/16 1319   Goal/Interventions addressed this shift   Nutritional intake is adequate Monitor daily weights;Assist patient with meals/food selection;Allow adequate time for meals;Encourage/perform oral hygiene as appropriate;Include patient/patient care companion in decisions related to nutrition;Assess anorexia, appetite, and amount of meal/food tolerated       Problem: Diabetes: Glucose Imbalance  Goal: Blood glucose stable at established goal  Outcome: Progressing   08/23/16 1319   Goal/Interventions addressed this shift   Blood glucose stable at established goal  Monitor lab values;Monitor intake and output. Notify LIP if urine output is < 30 mL/hour.;Follow fluid restrictions/IV/PO parameters;Include patient/family in decisions related to nutrition/dietary selections;Assess for hypoglycemia /hyperglycemia;Monitor/assess vital signs;Coordinate medication administration with meals, as indicated;Ensure appropriate diet and assess tolerance;Ensure adequate hydration;Ensure appropriate consults are obtained (Nutrition, Diabetes Education, and Case Management);Ensure patient/family has adequate teaching materials

## 2016-08-23 NOTE — Progress Notes (Signed)
CM attempted to reach patient's daughter. No family in room. Called daughter's cell but no answer, left VM. Called home phone number but went straight to voicemail; message left. Will continue to reach attempt family.       Blima Dessert  Discharge Planner  [phone]

## 2016-08-23 NOTE — Progress Notes (Signed)
Palliative Care Progress Note    Date Time: 08/23/16 3:37 PM  Patient Name: Marco Bruce  Referring physician:  Fernande Boyden, MD   Date of Admission: 08/18/2016  Referral Date: 08/20/2016  Consult Date: 08/23/2016  Referral Unit: Medical  Consulting Provider: Wallace Keller, DO  Consulting Service: Palliative Care  Primary Diagnosis:   SOB (shortness of breath) R06.02     ARF (acute renal failure) N17.9     Acute pulmonary edema J81.0              Assessment & Plan    History of Presenting Illness   OVERVIEW: The patient is a 83 y.o. [place] born male with a PMH of diabetes mellitus, gout, chronic kidney disease stage III, hypertension, diastolic congestive heart failure and severe valve disease with severe mitral regurgitation/ tricuspid regurgitation.  The patient was discharged from the hospital on 08/17/16 after he was treated for fever, systemic inflammatory response syndrome, demand ischemia, non-STEMI  Only to be re-admitted on 08/18/16 weakness, SOB and FTT.  The pt's Daughter was present at bedside and she  is adamant that her Father goes to SNF which will take place later tonight.  The pt is pain free and comfortable.  HPI: This is a 83 y.o. male admitted with Hyponatremia [E87.1]  Weakness [R53.1]  Hyperglycemia [R73.9]  Renal insufficiency [N28.9]  Elevated troponin [R74.8]  Anemia, unspecified type [D64.9]  Diabetes mellitus of other type with complication, unspecified long term insulin use status [E13.8]     PMH: diffuse osteoarthritis, HTN, CHF, CKD, SIRS and acute on chronic kidney failure, gout, failure to thrive, mitral and tricuspid regurgitation.      Review of Systems   Unable to perform ROS: Language       Review of Systems & Physical Examination   VS: BP 163/73   Pulse 64   Temp 97.2 F (36.2 C)   Resp 19   Ht 1.626 m (5\' 4" )   Wt 73 kg (161 lb)   SpO2 97%   BMI 27.64 kg/m     GENERAL: Appears tired but he is alert and interactive.  Constitutional: Anorexia Negative                             Fatigue Positive                                Drowsiness Positive  Eyes: PERRLA. Sclera anicteric.   HENT: Oral mucosa moist  NECK: Trachea midline. Neck veins flat.   Respiratory: No dyspnea, no accessory muscle use.       Cardiovascular:HEART: RRR.S1, S2.   CHEST: Breath sounds are clear bilaterally.  ABDOMEN: Soft. Non-tender. Non-distended. BS+.  Genitourinary:: Deferred.  Rectal: Deferred.  Extremities: No edema. No clubbing. No cyanosis.  Musculoskeletal: Generalized weakness  Skin: No rash or lesion.  PSYCH: Alert.   Neurologic:: Moves all extremities  Psychiatric:: Agitation/Restlessness  Negative                                         ESAS:    1.  Pain  Severity Pain last 24 hours: 2 (08/23/16 1500)  Pain Frequency   Pain Frequency: Increases with movement (08/22/16 0907)                       Pain Location        Pain Location: Foot (08/22/16 0907)  Pain Score: 0-No pain (08/23/16 1050)  2.    Constipation - Negative        3.    Dyspnea Dyspnea Last 24 hours: 1 (08/23/16 1500)      4.    Nausea Negative        5.    Anorexia Negative        6.    Fatigue Positive  7.   Advance Care Plan - NO  Code Status Full Code  Palliative Performance Scale:    30%   Palliative Care Primary Diagnosis: Other (Comment) (Endicrinology (DM)) (08/20/16 1300)      Assessment Plan: Will attempt to continue [place] with family tomorrow or Monday.     Social History:   Tobacco and Alcohol use:  reports that he has never smoked. He has never used smokeless tobacco. He reports that he does not drink alcohol or use drugs.   has no sexual activity history on file.   Occupation: Retired  Marital Status: Married    Living Situation:       Children: Yes  Psychosocial/Spiritual Supports: Family, Buddhist  Advance Directive: NO        Code status: Full Code   Designated agent: Patrcia Dolly      Relationship: Daughter  Contact information: [phone]  Past Medical History:     Past Medical History:   Diagnosis  Date   . Congestive heart failure (CHF)    . Diabetes mellitus    . Gout     chronic   . Hypertension        Past Surgical History:     Past Surgical History:   Procedure Laterality Date   . ABDOMINAL SURGERY         Family History:   No family history on file.    Allergies:   No Known Allergies    Medications:     Current Facility-Administered Medications   Medication Dose Route Frequency   . allopurinol  100 mg Oral Daily   . atorvastatin  40 mg Oral QPM   . gabapentin  100 mg Oral TID   . insulin glargine  20 Units Subcutaneous QHS   . lactobacillus/streptococcus  1 capsule Oral Daily   . lidocaine  1 patch Transdermal Daily   . NIFEdipine  60 mg Oral Daily   . pantoprazole  40 mg Oral QAM AC   . predniSONE  10 mg Oral Daily   . sodium bicarbonate  1,300 mg Oral BID     As needed medications include: acetaminophen, Nursing communication: Adult Hypoglycemia Treatment Algorithm **AND** dextrose **AND** dextrose **AND** glucagon (rDNA), Nursing communication: Adult Hypoglycemia Treatment Algorithm **AND** dextrose **AND** dextrose **AND** glucagon (rDNA), insulin lispro, insulin lispro Reviewed in Epic [facility]    Labs/Diagnostics:       Recent Labs  Lab 08/22/16  0556 08/21/16  0839 08/20/16  0730 08/19/16  0406   WBC 8.82 11.48* 12.51* 13.56*   Hgb 8.6* 8.9* 10.4* 9.3*   Hematocrit 26.0* 26.3* 30.8* 26.9*   Platelets 382 413* 464* 458*   Sodium 137 138 139 138   Potassium 3.9 3.7 3.9 3.3*   Chloride 105 106 107 108  CO2 24 24 24 24    BUN 34.4* 39.7* 42.2* 71.3*   Creatinine 1.4* 1.5* 1.4* 1.9*   EGFR 47.8 44.2 47.8 33.6   Glucose 302* 227* 172* 171*   AST (SGOT)  --  24 26 29    ALT  --  14 18 18    Alkaline Phosphatase  --  69 75 89   Albumin  --  2.7* 2.7* 2.6*   Bilirubin, Total  --  0.4 0.4 0.3     Wt Readings from Last 10 Encounters:   08/23/16 73 kg (161 lb)   08/16/16 68.9 kg (152 lb)   08/08/16 64 kg (141 lb 1.5 oz)   05/26/16 63.5 kg (140 lb)   09/06/15 61.2 kg (135 lb)   08/04/15 61.2 kg (135 lb)   07/23/15  62.1 kg (137 lb)   07/07/15 63.5 kg (140 lb)   07/02/15 63.1 kg (139 lb 3.2 oz)   06/26/15 68.8 kg (151 lb 9.6 oz)      Signed by: Marco Bruce, [facility]    [phone]

## 2016-08-23 NOTE — Discharge Instr - AVS First Page (Addendum)
Reason for your Hospital Admission:  Hyperglycemia       Instructions for after your discharge:  Follow up with PCP in 1 week.   Continue prednisone 10 mg for 5 days then taper to 5 mg.       [email or web address] Program    Education about New Medications and their Side effects    Dear Marco Bruce,    Its been a pleasure taking care of you during your hospitalization here at [facility]. We have initiated a new program to educate our patients and/or their family members or designated personnel about the new medications started by your physicians and their indications along with the possible side effects. Multiple studies have shown that patients started on new medications are often unaware of the names of the medication along with the indications and their side effects which leads to decreased compliance with the medications.    During our conversation today on 08/23/2016  4:53 PM I have explained to you the name of the new medication and the indication along with some possible common side effects. Listed below are some of the new medications started during this hospitalization.     Please call the Nurse if you have any side effects while in hospital.     Please call 911 if you have any life threatening symptoms after you are discharged from the hospital.    Please inform your Primary care physician for common side effects which are not life threatening after discharge.    Medication: Insulin    This Medication is used for:   Diabetes   High Potassium Levels    Common Side Effects are:   Low Blood sugars(Weakness, Fatigue and rapid heart rate)    A note from your nurse:  Call your nurse immediately if you notice itching, hives, swelling or trouble breathing           Thank you for your time.    Allayne Stack, RN  08/23/2016  4:53 PM  [facility]  [address]  [place], [place]  [id number]

## 2016-08-23 NOTE — Progress Notes (Signed)
Tele removed. Saline loc removed. Copy of all discharge instructions in packet for nurse at [facility].  Patient discharged to [facility]. Transported via [facility]. Pt family took his belongings home prior to [facility] transporting pt. Called report to Clair Gulling at [facility] at 1825 at [phone].

## 2016-08-23 NOTE — Plan of Care (Signed)
Problem: Safety  Goal: Patient will be free from injury during hospitalization  Outcome: Progressing   08/23/16 0025   Goal/Interventions addressed this shift   Patient will be free from injury during hospitalization  Hourly rounding;Provide and maintain safe environment;Use appropriate transfer methods;Ensure appropriate safety devices are available at the bedside;Assess patient's risk for falls and implement fall prevention plan of care per policy       Problem: Pain  Goal: Pain at adequate level as identified by patient  Outcome: Progressing   08/23/16 0025   Goal/Interventions addressed this shift   Pain at adequate level as identified by patient Evaluate if patient comfort function goal is met;Evaluate patient's satisfaction with pain management progress;Identify patient comfort function goal;Assess pain on admission, during daily assessment and/or before any "as needed" intervention(s);Reassess pain within 30-60 minutes of any procedure/intervention, per Pain Assessment, Intervention, Reassessment (AIR) Cycle

## 2016-08-23 NOTE — Progress Notes (Signed)
After Visit Summary      Marco Bruce OVF:[id number]   08/18/2016 - 08/23/2016  [facility]  [facility]  [phone]   After Visit Summary   Most Important        About Your Stay       Reason for your Hospital Admission:  Hyperglycemia       Instructions for after your discharge:  Follow up with PCP in 1 week.   Continue prednisone 10 mg for 5 days then taper to 5 mg.          Things To Do     Ask        Ask how to get these medications   1 cyclobenzaprine   2 insulin glargine   3 insulin lispro   4 predniSONE   Doctor in charge of your hospital stay     Tipler, Marco Buggy, MD Phone: [phone]  The following tests are still processing. Please ask your doctor.     Order Current Status   Culture Blood Aerobic and Anaerobic Preliminary result   What's next     What's next    Follow up with Candelaria Celeste, MD   appointment set, Tuesday May 22nd at 10:45 am  [facility]   [address]   [address]   [place]  [address]   [phone]    You are allergic to the following     You are allergic to the following   No active allergies   Immunizations Administered for This Admission     No immunizations on file.   Medication List Instructions     Medication Lists help reduce medication errors and help prevent harmful drug interactions. Below is your Discharge Medication List that was reviewed by the physician today. Please maintain and update your medication list and share it with your health care providers at every visit.  Discharge Medication List     STOP taking these medications     STOP taking these medications   insulin glargine 100 UNIT/ML injection pen   Replaced by: insulin glargine 100 UNIT/ML injection       TAKE these medications     TAKE these medications    Instructions AM Noon PM Bed As Needed   allopurinol 100 MG tablet   Common Name: ZYLOPRIM     Take 1 tablet (100 mg total) by mouth daily.              amoxicillin-clavulanate 500-125 MG per  tablet   Common Name: AUGMENTIN     Take 1 tablet by mouth every 12 (twelve) hours.              atorvastatin 40 MG tablet   Common Name: LIPITOR     Take 1 tablet (40 mg total) by mouth every evening.              chlorthalidone 50 MG tablet   Common Name: [place] EVERY DAY IN THE MORNING WITH FOOD              cyclobenzaprine 10 MG tablet   Common Name: FLEXERIL   For diagnoses: Lumbar pain with radiation Bruce both legs     Take 1 tablet (10 mg total) by mouth every 8 (eight) hours as needed for Muscle spasms.              docusate sodium 100 MG capsule  Common Name: COLACE     Take 100 mg by mouth 2 (two) times daily.              gabapentin 100 MG capsule   Common Name: NEURONTIN     TAKE ONE CAPSULE BY MOUTH EVERY 8 HOURS              glucose blood test strip   Common Name: TRUE METRIX BLOOD GLUCOSE TEST     1 each by Other route 2 (two) times daily. Use as instructed              insulin glargine 100 UNIT/ML injection   Common Name: LANTUS   Replaces: insulin glargine 100 UNIT/ML injection pen     Inject 20 Units into the skin nightly.              * insulin lispro 100 UNIT/ML injection   Common Name: HumaLOG     Inject 1-3 Units into the skin nightly as needed (High blood glucose. See administration instructions.).              * insulin lispro 100 UNIT/ML injection   Common Name: HumaLOG     Inject 1-5 Units into the skin 3 (three) times daily before meals as needed (High blood glucose. See administration instructions.).              Insulin Pen Needle 31G X 5 MM Misc   For diagnoses: Uncontrolled type 2 diabetes mellitus with stage 4 chronic kidney disease, with long-term current use of insulin     INJECT INSULIN DAILY AS DIRECTED              lactobacillus/streptococcus Caps     Take 1 capsule by mouth daily.              lidocaine 5 %   Common Name: LIDODERM     Place 1 patch onto the skin daily.Remove  & Discard patch within 12 hours or as directed by MD              Linagliptin 5 MG Tabs     Take 5 mg by mouth daily.              NIFEdipine 60 MG 24 hr tablet   Common Name: PROCARDIA XL     TAKE 2 TABLETS BY MOUTH DAILY              pantoprazole 40 MG tablet   Common Name: PROTONIX     TAKE ONE TABLET BY MOUTH EVERY DAY              predniSONE 10 MG tablet   Common Name: Donley Redder   Start Date: 08/24/2016   What changed:   0 medication strength   0 how much to take     Take 1 tablet (10 mg total) by mouth daily.              sodium bicarbonate 650 MG tablet     Take 2 tablets (1,300 mg total) by mouth 2 (two) times daily.              * This list has 2 medication(s) that are the same as other medications prescribed for you. Read the directions carefully, and ask your doctor or other care provider to review them with you.         Where to pick up your medications        Ask your  doctor where to pick up these medications      . cyclobenzaprine 10 MG tablet     . insulin glargine 100 UNIT/ML injection     . insulin lispro 100 UNIT/ML injection (2 prescriptions)    . predniSONE 10 MG tablet    MyChart Sign Up        Status : Active     You currently have an active MyChart account which allows you to send secure messages to your doctor, view your test results, request prescription refills, request or   schedule appointments, and more.    If you have not logged in to [place] for a while and have forgotten your user name or password please use the "Forgot MyChart Username" or "Forgot MyChart Password" links located next to the logon boxes at  [email or web address] or [email or web address]    If you have any questions regarding your results please contact your Primary Care Physician ([place]).    *Remember, MyChart is NOT to be used for urgent needs. For medical emergencies, dial 911.  For same-day concerns, please contact your primary health care provider.          Heart Failure Discharge Instructions      Follow Up:           Most likely there are specific follow up instructions listed in another section of these discharge instructions. If we have not already scheduled a follow-up appointment with your physician (Cardiologist or (PCP) Primary Care Physician),  a good rule of thumb is to call your PCP as soon as you are home to make an appointment to be seen within 3 - 5 days of discharge. If you do not have a PCP,please call [phone] for assistance 24 hours a day, 7 days a week.    Activity:               Unless otherwise instructed, gradually increase your activity as tolerated.  Let your symptoms guide you.  If your doctor has not discussed about enrolling you in cardiac rehab, bring it up at your next appointment     Diet:         Follow a heart healthy diet low in saturated fat and cholesterol.   Limit salt (sodium) in your diet to less than 2000 mg daily (1 teaspoon salt = 2400 mg sodium).    Consult with your physician before using a salt substitute, which may be high in potassium.    Fluid restriction:  Limit your daily fluid intake to 1.5 liters or 48 ounces.    Risk Factors:        All patients are advised to not smoke or use any tobacco products.    If you use tobacco products and want help in quitting, call the QuitLine:1-800-QUIT-NOW 317-296-7280).   Know the symptoms of heart failure use the Heart Failure Zone information below to manage symptoms.    Weight Monitoring:     Rapid changes in weigh may reflect retention of fluid, which will worsen your heart failure symptoms.    Weigh yourself each morning before breakfast.     To ensure consistent results, place your scale on a section of bare floor without rug or carpeting.     Record date, time, and weight in your notebook and bring it with you to appointments to review with your doctor.     Review the Heart Failure Zone instructions for when to call your  doctor.  Managing your Heart Failure Using the "Zones"    @LASTWEIGHT (1)@      Green Zone - All Clear  Action      No new or worsening shortness of breath   No new or worsening swelling of your hands, abdomen, legs or ankles   No Weight gain   No chest pain or tightness   No decrease in your ability to maintain your activity level      Continue taking your medications as ordered   Continue your daily weights   Follow a low salt diet   Keep all physician appointments and follow-ups     Yellow Zone - Caution Action      Weight gain of 2 pounds in a day   Increased swelling of your hands, abdomen, legs, or ankles   Increased in shortness of breath with activity   Increase in the number of pillows needed to sleep at night   New or more frequent chest pain or tightness   New onset of dizziness or lightheadedness after standing up      Call your physician's office      Do not wait until you are RED to call.    Call for even one symptom!     Red Zone - "Emergency" Action      Unrelieved shortness of breath or shortness of breath at rest   Unrelieved chest pain   Wheezing or chest tightness at rest   Need to sit in a chair to sleep   Weight gain of more than 3 pounds in a day or 5 pounds in a week   A fall related to dizziness or lightheadedness      CALL your physician's office right away!      Call 911 if you:   Faint or pass out   Become extremely short of breath or are unable to talk due to breathlessness   Have severe chest pain     Learn more at [email or web address].hu                                  Heart Failure Education Checklist     I understand that my heart has structural and/or functional problems which cause the symptoms of heart failure.     I know what my Ejection Fraction (EF), a measure of heart function, is:________%     I know what medications I take for heart failure and understand how to take  them     I understand the importance of taking my medications, exactly as they are prescribed so that I can feel better, live longer, and stay out of the hospital     I understand the importance of weighing myself daily and that I should contact my physician if I have unexpected weight gain (2 pounds in one day)     I know what symptoms to look for to make sure my heart failure is not getting worse such as swelling of legs or ankles, more trouble breathing, dizziness, or feeling tired     I understand that physical activity is good for me and I have been given instructions on what type of exercises to do     I understand that I should maintain a low salt diet, monitor my fluid intake, and follow dietary instructions provided by my healthcare provider     I understand the heart failure zones and know what to do  if my symptoms are in the yellow or red zone    Acute Myocardial Infarction Discharge Instructions    Medication - Anti platelet agents:    Some patients after a heart attack get a coronary stent.  If you are one of these patients, then it is common to take both aspirin and anti-platelet medication (clopidogrel, prasugrel, ticlopidine, or ticagrelor) for at least one year.  Never stop this combination of medication without discussing first with your cardiologist.      Follow up:   Most likely there are specific follow up instructions listed in another section of these discharge instructions.  If we have not already scheduled a follow-up appointment with your physician (Cardiologist or PCP (Primary Care Physician), a good rule of thumb is to call your PCP as soon as you are home to see this doctor within 3-5 days of discharge.  If you do not have a PCP, please call [phone] for assistance 24 hours a day, 7 days a week.    Risk Factors:             All patients are advised to not smoke or use any tobacco products. If you use tobacco products and want help in quitting, call the QuitLine:  1-800-QUIT-NOW 8021899992).    Diet:  Follow a heart healthy diet low in saturated fat and cholesterol. You may have a more specific diet personalized for you in the patient instructions section.  Weighing yourself daily can alert you to fluid gain and may motivate you to lose weight.    Activity:           Unless otherwise instructed, in the patient instructions section, gradually increase your activity as tolerated.  Remember that recovery after a heart attack takes time. Plan to rest for at least 4-8 weeks while you recover. Then return to normal activity when your doctor says it's okay. Talk to your physician about an exercise program and or cardiac rehabilitation. Should your warning symptoms worsen or recur, especially with exercise; please contact your physician.    Heart Attack Warning Signs:   Chest Discomfort: Most heart attacks involve discomfort in the center of the chest that lasts more than a few minutes, or that goes away and comes back.  It can feel like uncomfortable pressure, squeezing, fullness or pain.   Discomfort in other areas of the upper body   Shortness of breath   Other signs  When to Seek Medical Attention  Symptoms Action    Chest Pain not relieved by medication*   Shortness of breath   Lightheadedness Call 9-1-1 immediately    Dizziness   Fainting   Feeling irregular heartbeat or fast pulse Call your doctor immediately     *IF you experience chest pain or symptoms similar to what brought you in to the hospital: take your medication nitroglycerin by mouth as prescribed.                                              Our patients are the reason for all we do: we want to improve and you can help! You may receive a survey asking about your visit - this will come from [place] through postal mail or via email from our Psychologist, forensic. Please take the time to complete it; your valuable input will be used to recognize exceptional members of the care team  and improve  the quality of our service. Thank you!  Patient Signature: ____________________________________________________________   Date and Time: ____________________________________________________________   Responsible Adult: ____________________________________________________________   Date and Time: ____________________________________________________________   Nurse Signature: ____________________________________________________________   Date: ____________________________________________________________

## 2016-08-23 NOTE — Discharge Summary (Signed)
[facility] HOSPITALIST   [place] Summary   Patient Info:   Date/Time: 08/23/2016 / 4:11 PM   Admit Date:08/18/2016  Patient Name:Marco Bruce   OQH:[id number]   PCP: Candelaria Celeste, MD  Attending Physician:Destani Wamser, Marquis Buggy, MD     Hospital Course:   Please see H&P for complete details of HPI and ROS. The patient was admitted to [facility] and has been diagnosed with Hospital Problems:  Active Problems:    Weakness   and has been taken care as mentioned below.    83 year old with past medical history of diffuse osteoarthritis, diastolic heart failure, chronic kidney disease, discharged last week after being managed for SIRS and acute on chronic kidney failure, returned for hyperglycemia.     # Hyperglycemia  Patient was managed in ICU with insulin drip, required more than 30 u over 24 h    # DM - glucose still not well controlled,  but he is on steroids as well   Continue Lantus to 20 u and adjust as needed   Continue with sliding scale     # Polymyalgia rheumatica  During previous admission patient presented with b/l shoulder and hip pain and decreased ROM, which have significantly improved after taking steroids.  Today he is feeling much better, no complaints of joint pain    Prednisone was tapered today to 10 mg , continue at this dose for 5 days and then taper to 5 mg   Continue PT/OT    # Leukocytosis - likely due to steroids- resolved   On CXR there was suspicion of pneumonia vs atelectasis, with increased left basilar opacities, but clinically he does not exhibit pneumonia. Patient has received a total of 8 continuous days of antibiotics from previous admission to this one. During initial admission he presented with SIRS , but there was no clear source on infection.   Blood cultures - no growth to date    # Chronic diastolic heart failure with severe MR - stable    # Chronic kidney disease stage 3- at baseline     # Gout -   Continue allopurinol     # HTN   Continue nifedipine     # HLD    Continue lipitor     # Chronic anemia - at baseline     Palliative service was consulted for clarification s of goals of care and status code. Patient is full code.   Patient is being discharged today to SNF.         Disposition:SNF  Condition at Discharge and Prognosis: stable   Admission Date:08/18/2016  Discharge Date: 08/23/16  Type of Admission:Inpatient  Medical Necessity for stay:hyperglycemia   Code Status: Full Code  Subjective at the time of discharge:   Patient feels well and has no complaints. He is hemodynamically stable to be discharged.   Chief Complaint:  Social Difficulties    Objective:     Vitals:    08/23/16 0111 08/23/16 0614 08/23/16 0954 08/23/16 1320   BP: 154/70 178/80 180/83 163/73   Pulse: 69 69 70 64   Resp: 14 16 16 19    Temp: (!) 96.3 F (35.7 C) 99 F (37.2 C) 98.6 F (37 C) 97.2 F (36.2 C)   TempSrc: Temporal Artery Oral Oral    SpO2: 99% 97% 98% 97%   Weight:  73 kg (161 lb)     Height:         Physical Exam:   Physical Exam  Constitutional: He appears well-developed and well-nourished.   HENT:   Head: Normocephalic and atraumatic.   Eyes: Conjunctivae are normal.   Neck: Neck supple. No JVD present.   Cardiovascular: Normal rate, regular rhythm, normal heart sounds and intact distal pulses.    Pulmonary/Chest: Effort normal and breath sounds normal. No respiratory distress. He has no wheezes. He has no rales.   Abdominal: Soft. Bowel sounds are normal. He exhibits no distension. There is no tenderness. There is no rebound.   Musculoskeletal: He exhibits no edema or tenderness.   Neurological: He is alert.   Skin: Skin is warm and dry.   Psychiatric: He has a normal mood and affect. His behavior is normal.   Vitals reviewed.      Clinical Presentation:   History of Presenting Illness: Please refer to HPI in the Detailed H&P  Discharge Medications:   Discharge Medications:     Discharge Medication List      Taking    allopurinol 100 MG tablet  Dose:  100 mg  Commonly known  as:  ZYLOPRIM  Take 1 tablet (100 mg total) by mouth daily.     amoxicillin-clavulanate 500-125 MG per tablet  Dose:  1 tablet  Commonly known as:  AUGMENTIN  Take 1 tablet by mouth every 12 (twelve) hours.     atorvastatin 40 MG tablet  Dose:  40 mg  Commonly known as:  LIPITOR  Take 1 tablet (40 mg total) by mouth every evening.     chlorthalidone 50 MG tablet  Commonly known as:  HYGROTEN  TAKE ONE TABLET BY MOUTH EVERY DAY IN THE MORNING WITH FOOD     cyclobenzaprine 10 MG tablet  Dose:  10 mg  Commonly known as:  FLEXERIL  Take 1 tablet (10 mg total) by mouth every 8 (eight) hours as needed for Muscle spasms.     docusate sodium 100 MG capsule  Dose:  100 mg  Commonly known as:  COLACE  Take 100 mg by mouth 2 (two) times daily.     gabapentin 100 MG capsule  Commonly known as:  NEURONTIN  TAKE ONE CAPSULE BY MOUTH EVERY 8 HOURS     glucose blood test strip  Dose:  1 each  Commonly known as:  TRUE METRIX BLOOD GLUCOSE TEST  1 each by Other route 2 (two) times daily. Use as instructed     insulin glargine 100 UNIT/ML injection  Dose:  20 Units  Commonly known as:  LANTUS  Inject 20 Units into the skin nightly.  Replaces:  insulin glargine 100 UNIT/ML injection pen     * insulin lispro 100 UNIT/ML injection  Dose:  1-3 Units  Commonly known as:  HumaLOG  Inject 1-3 Units into the skin nightly as needed (High blood glucose. See administration instructions.).     * insulin lispro 100 UNIT/ML injection  Dose:  1-5 Units  Commonly known as:  HumaLOG  Inject 1-5 Units into the skin 3 (three) times daily before meals as needed (High blood glucose. See administration instructions.).     Insulin Pen Needle 31G X 5 MM Misc  INJECT INSULIN DAILY AS DIRECTED     lactobacillus/streptococcus Caps  Dose:  1 capsule  Take 1 capsule by mouth daily.     lidocaine 5 %  Dose:  1 patch  Commonly known as:  LIDODERM  Place 1 patch onto the skin daily.Remove & Discard patch within 12 hours or as directed by MD  Linagliptin 5 MG  Tabs  Dose:  5 mg  Take 5 mg by mouth daily.     NIFEdipine 60 MG 24 hr tablet  Commonly known as:  PROCARDIA XL  TAKE 2 TABLETS BY MOUTH DAILY     pantoprazole 40 MG tablet  Commonly known as:  PROTONIX  TAKE ONE TABLET BY MOUTH EVERY DAY     predniSONE 10 MG tablet  Dose:  10 mg  What changed:   medication strength   how much to take  Commonly known as:  DELTASONE  Start taking on:  08/24/2016  Take 1 tablet (10 mg total) by mouth daily.     sodium bicarbonate 650 MG tablet  Dose:  1300 mg  Take 2 tablets (1,300 mg total) by mouth 2 (two) times daily.        * This list has 2 medication(s) that are the same as other medications prescribed for you. Read the directions carefully, and ask your doctor or other care provider to review them with you.            STOP taking these medications    insulin glargine 100 UNIT/ML injection pen  Replaced by:  insulin glargine 100 UNIT/ML injection          Follow up recommendations:   Follow up:   Follow-up Information     Candelaria Celeste, MD Follow up.    Specialty:  Internal Medicine  Why:  appointment set, Tuesday May 22nd at 10:45 am  Contact information:  [address]  [place]  [address]  [phone]                  Results of Labs/imaging:   Labs have been reviewed:   Coagulation Profile:       CBC review:   Recent Labs  Lab 08/22/16  0556 08/21/16  0839 08/20/16  0730 08/19/16  0406 08/18/16  1234   WBC 8.82 11.48* 12.51* 13.56* 11.36*   Hgb 8.6* 8.9* 10.4* 9.3* 9.2*   Hematocrit 26.0* 26.3* 30.8* 26.9* 27.1*   Platelets 382 413* 464* 458* 488*   MCV 90.3 89.8 90.6 87.6 89.1   RDW 16* 16* 16* 15 15   Neutrophils  --  73.5 75.3 71.2 70.8   Lymphocytes Automated  --  12.8 11.2 12.5 14.0   Eosinophils Automated  --  5.2 5.6 6.9 4.0   Immature Granulocyte  --  0.7 1.0 1.8 2.4   Neutrophils Absolute  --  8.44* 9.43* 9.65* 8.05   Absolute Immature Granulocyte  --  0.08* 0.12* 0.24* 0.27*     Chem Review:  Recent Labs  Lab 08/22/16  0556 08/21/16  0839  08/20/16  0730 08/19/16  0406 08/18/16  1234   Sodium 137 138 139 138 131*   Potassium 3.9 3.7 3.9 3.3* 4.0   Chloride 105 106 107 108 98*   CO2 24 24 24 24 22    BUN 34.4* 39.7* 42.2* 71.3* 95.0*   Creatinine 1.4* 1.5* 1.4* 1.9* 2.5*   Glucose 302* 227* 172* 171* 628*   Calcium 8.4 8.7 8.9 8.4 8.6   Bilirubin, Total  --  0.4 0.4 0.3 0.3   AST (SGOT)  --  24 26 29 27    ALT  --  14 18 18 21    Alkaline Phosphatase  --  69 75 89 117*     Results     Procedure Component Value Units Date/Time    Glucose  Whole Blood - POCT [[id number]]  (Abnormal) Collected:  08/23/16 1041     Updated:  08/23/16 1045     POCT - Glucose Whole blood 222 (H) mg/dL     Glucose Whole Blood - POCT [[id number]]  (Abnormal) Collected:  08/23/16 0729     Updated:  08/23/16 0739     POCT - Glucose Whole blood 235 (H) mg/dL     Glucose Whole Blood - POCT [[id number]]  (Abnormal) Collected:  08/22/16 2101     Updated:  08/22/16 2134     POCT - Glucose Whole blood 361 (H) mg/dL     Culture Blood Aerobic and Anaerobic [[id number]] Collected:  08/18/16 1642    Specimen:  Blood, Venipuncture Updated:  08/22/16 2021    Narrative:       Indications:->Sepsis  ORDER#: [id number]                                    ORDERED BY: MENDIGUREN, IGN  SOURCE: Blood, Venipuncture r wrist                  COLLECTED:  08/18/16 16:42  ANTIBIOTICS AT COLL.:                                RECEIVED :  08/18/16 19:33  Culture Blood Aerobic and Anaerobic        PRELIM      08/22/16 20:21  08/19/16   No Growth after 1 day/s of incubation.  08/20/16   No Growth after 2 day/s of incubation.  08/21/16   No Growth after 3 day/s of incubation.  08/22/16   No Growth after 4 day/s of incubation.      Glucose Whole Blood - POCT [[id number]]  (Abnormal) Collected:  08/22/16 1630     Updated:  08/22/16 1644     POCT - Glucose Whole blood 381 (H) mg/dL         Radiology reports have been reviewed:  Radiology Results (24 Hour)     ** No results found for the last 24 hours. **         Echocardiogram Adult Complete W Clr/ Dopp Waveform    Result Date: 08/14/2016  ECHOCARDIOGRAM Sonographer:  Laurena Bering Durohom Indications:  CHF Height (in):  62 Weight (lb):  147 Blood Pressure:  156/67   2-D Measurements Left Ventricle                                          Diastolic Dimension:  58  (04-88 mm) Systolic Dimension:  44  (25-40 mm)     Septal Diastolic Thickness:  12  (6-11 mm)    Posterior Wall Thickness:  12  (6-11 mm) Fractional Shortening Percentage:  24%  (24-46 %)                       Visually Estimated Ejection Fraction:  40%  (55-75 %)                        Right Ventricle Diastolic Dimension:  34  (7-26 mm)  Left Atrium End Systolic Dimension:  38  (19-40 mm)                    Aortic Root:  38  (20-37 mm)                       Doppler Measurements and Color Flow Imaging Valves                                        Aortic Valve:  2.1  (0.9-1.8 m/s).         Regurgitation:  Mild Pulmonic Valve:  0.9  (0.6-0.9 m/s).     Regurgitation:  Mild Mitral Valve:  1.3  (0.6-1.4 m/s).          Regurgitation:  Moderately severe Tricuspid Valve:  0.5  (0.4-0.8 m/s.     Regurgitation:  Trace Left Ventricular Outflow Tract Velocity:  1.3 m/s. E/A Ratio:  1.2 Est. PASP:  56 Est. RA Mean Pressure:  10 LA Volume 96 (18-58 ml) LA Volume Index 56 (16-28 ml/m2) TAPSE 16 (> or =16 mm) Echocardiogram M-mode, 2D, spectral Doppler and color flow imaging were performed and interpreted.     1.  The quality of the study is technically adequate for interpretation. 2.  The left ventricle is enlarged in size. Left ventricular systolic function is reduced. There is akinesis involving the mid and distal anterior wall, septum, and apex.. Estimated EF is 40%. There is mild left ventricular hypertrophy. There is no evidence of abnormal diastolic LV function. 3.  The left atrium is enlarged in size. 4.  The aortic valve is trileaflet. Normal valve excursion is present. There is mild aortic  insufficiency. No aortic stenosis is present. The aortic root is mildly enlarged in size. Mild aortic valve sclerosis is seen. 5.  The mitral valve is sclerotic with moderate mitral annular calcification. Moderately severe mitral regurgitation is seen. 6.  The right ventricle is normal in size and function. 7.  The right atrium is normal in size. 8. The tricuspid valve is structurally normal. There is no significant tricuspid insufficiency. 9. The pulmonic valve is structurally normal. There is no significant pulmonic insufficiency. 10. There is  evidence of moderate pulmonary hypertension. 11. No pericardial effusion, intracardiac thrombi, or masses are seen. Pacemaker wire noted 12. The atrial septum is structurally normal. No shunt by color flow doppler. CONCLUSION: Dilated left ventricle with reduced left ventricular systolic function. Estimated ejection fraction of 40%. Regional wall motion abnormalities as above. Mild left ventricular hypertrophy. Aortic valve sclerosis with mild aortic insufficiency. Dilated aortic root. Sclerotic mitral valve with moderately severe mitral regurgitation. Moderate pulmonary hypertension. Pacemaker wire noted. Marnee Spring, MD 08/14/2016 2:10 PM    Chest 2 Views    Result Date: 08/18/2016  Clinical history: Weakness. COMPARISON: 08/14/2016. Chest, AP and lateral: Cardiac pacing hardware is intact and stable in position. The cardiac silhouette is enlarged but probably stable. The hilar silhouettes remain within normal limits. The lungs are grossly clear.      Stable cardiomegaly. Clear lungs. Nicholes Rough, MD 08/18/2016 1:23 PM    US Renal Kidney    Result Date: 08/13/2016  History: Acute on chronic kidney disease. COMPARISON: 05/28/2015. FINDINGS: Ultrasound examination kidneys was performed. Right kidney measures 10 cm, previously 9.8 cm. Left kidney measures 10.5 cm and is stable in size. Evaluation is  very limited by the patient's condition. No hydronephrosis is  identified. A small right lower pole cyst seen on the prior exam could not be seen which is probably technical. No definite masses are identified. Lobularity in the right renal contour is again seen most likely due to scarring. Renal cortex is normal in thickness. The increased echogenicity suggested in the prior exam is not clearly seen today but possibly technical with the kidneys suboptimally visualized today. Limited views of bladder showed no abnormalities. There is a 380 cc bladder volume. Patient was unable to void.      Limited exam showing normal size kidneys without hydronephrosis. Mild scarring in the right kidney. Haynes Hoehn, MD 08/13/2016 2:01 PM    Xr Chest Ap Portable    Result Date: 08/19/2016  Indication: Systemic inflammatory response syndrome. Procedure: Portable AP view of the chest. Comparison: Prior exam May 16. Findings: There is a left-sided pacemaker generator with leads extending into the right atrium and right ventricle. Normal cardiac size. Small basilar opacities are increased from previous exam and are likely due to atelectasis. Pneumonia is also possible, however. No definite effusion. No pneumothorax. No evidence of pulmonary edema.      Increased left basilar opacities likely due to atelectasis. Pneumonia is also possible, however. Roetta Sessions, MD 08/19/2016 8:23 AM    Xr Chest Ap Portable    Result Date: 08/14/2016  History: Fever. AP portable radiograph compared to 08/14/2014 shows hypoventilation with vascular congestion and borderline cardiac enlargement. No significant consolidation effusion or pneumothorax. Evaluation limited by low volumes.      Hypoventilation limits evaluation. No consolidation. Verner Mould, MD 08/14/2016 12:39 PM    Xr Chest Ap Portable    Result Date: 08/13/2016  HISTORY: Shortness of breath. COMPARISON: 05/26/2015 Portable chest: Left-sided cardiac pacing device is noted. There are low lung volumes accentuating the cardiomediastinal silhouette. There  is grossly stable cardiac enlargement. Calcifications are seen at the aortic knob. There is mild vascular congestion without overt pulmonary edema. There is no focal consolidation. No pleural effusion or pneumothorax.      1.  Low lung volumes. Pulmonary vascular congestion without overt pulmonary edema. 2.  Stable cardiomegaly. Park Pope, MD 08/13/2016 5:52 AM    Ct Abdomen Pelvis Wo Iv/ W Po Cont    Result Date: 08/15/2016  History: Fever. Leukocytosis. CT imaging performed through the abdomen and pelvis following oral but without intravenous contrast. No comparison available. History: Abdominal pain. Sepsis. CT imaging performed through the abdomen and pelvis following oral but without intravenous contrast. No consolidation on limited imaging through lung bases. The heart is enlarged and coronary artery calcification noted. The unenhanced liver, pancreas, spleen, adrenal glands, and kidneys are normal. No hydroureteronephrosis. Aorta normal caliber of multifocal calcific plaque. The bowel is nondistended. Rectal wall thickening is nonspecific. No surrounding inflammation however. No adenopathy or ascites. Normal appendix seen at the right lower quadrant. No osseous lesions.      Limited unenhanced CT. Nonspecific mild rectal wall thickening. No localizing inflammatory or infectious process to explain sepsis and leukocytosis. Verner Mould, MD 08/15/2016 2:14 PM    Pathology:   Specimens     None        Pending Lab Results:   Labs/Images to be followed at your PCP office: Unresulted Labs     None         Hospitalist:   Signed by: Marquis Buggy Lannis Lichtenwalner  08/23/2016 4:11 PM  Time spent for discharge:  45 minutes      *This note was generated by the Epic EMR system/ Dragon speech recognition and may contain inherent errors or omissions not intended by the user. Grammatical errors, random word insertions, deletions, pronoun errors and incomplete sentences are occasional consequences of this technology due to software  limitations. Not all errors are caught or corrected. If there are questions or concerns about the content of this note or information contained within the body of this dictation they should be addressed directly with the author for clarification

## 2016-08-24 ENCOUNTER — Encounter (INDEPENDENT_AMBULATORY_CARE_PROVIDER_SITE_OTHER): Payer: Self-pay

## 2016-08-24 NOTE — Progress Notes (Signed)
TCM Medicare Focus Diagnosis Navigator  Initial Call    Enrollment    Call participant, relationship to patient and contact information: Butch Penny from [facility]    HIPAA verification completed (verify patient's DOB) (Y/N): Yes    Primary contact at facility for follow-up calls: Butch Penny or esther    Name and number of TCM Medicare Navigator provided (Y/N): Yes    Informed of role of TCM Medicare Navigator and duration of services provided (Y/N): yes      Patient [facility] Admission Date:08/18/16    Hospital Discharge Date: 08/23/16    Index Admission Diagnosis: CHF    Hospital Discharge Disposition: SNF    Agencies/Facility Involved in Care: [facility]    Expected length of stay at facility: Pending    Does the patient have an advance directive? no    If the patient has an advance directive, did the facility receive it at time of discharge from the hospital and admission to the facility? no      Physician Involvement    Physician involved in care at facility (medical director, SNFist, community PCP): Dr. Zella Ball     Was patient seen by a physician or nurse practitioner within 24 hours of admission to the facility? (Y/N): Yes    PCP Name and Number: Candelaria Celeste, MD @ [phone]    PCP's office notified telephonically and in writing of patient's hospitalization, current level of care, and Navigator involvement (Y/N): Yes, this Probation officer faxed PCP letter to fax [phone]    Index Diagnosis Specialist Name and Number: no specialist listed under the care team      Teach Back    For Service Provider/Facility:      Informed facility staff of "[facility]'s 40 Day Commitment to Care" Document and sent document to facility (Y/N): Yes, this writer confirmed pt received 60 day commitment to care/ [place]    Was facility notified that Navigator or ED should be called PRIOR to any return to the hospital in an effort to see if other interventions could be put in place to prevent a  readmission (Y/N): Yes    If questions, where was service provider/facility directed for resolution of concerns:    Date of resolution of concerns/questions:       Comments:        Medications    For Facility:       Was the patient seen by a physician within 24 hours of arriving to the facility?  (Y/N): Yes    Did patient/facility obtain all medications after hospital discharge? (Y/N): Yes    Was a medication reconciliation completed after discharge by the facility? (Y/N): Yes    Are there concerns or questions about patient's medications? (Y/N): no    If concern/questions, where was facility directed for resolution of concerns:    Date of resolution of concerns/questions:        Follow Up    Follow up needs: no follow up needs     Follow up plan: Follow up with pts assigned nurse Butch Penny or Sherlynn Stalls      Comments      Navigator contacted [facility] @ [phone] and spoke with nurse, Butch Penny. This Probation officer provided nurse with navigator's role description, and length of services. Per donna all patients medications were received and reconciled. Per nurse pt was seen within 24 hours by Dr. Zella Ball. This Probation officer notified nurse to contact navigator prior to patient  being readmitted to the hospital. Navigator will follow up per MFN protocol.    Anne Shutter, BS  TCM Medicare Navigator   [facility]  [address]  [place] [address]  T [phone] F [phone]

## 2016-08-31 ENCOUNTER — Encounter (INDEPENDENT_AMBULATORY_CARE_PROVIDER_SITE_OTHER): Payer: Self-pay

## 2016-08-31 NOTE — Progress Notes (Signed)
TCM Medicare Focus Diagnosis Navigator  Follow-Up Call    Call participant, relationship to patient and contact information: Nurse Sherlynn Stalls from from [facility]      Patient Information    Agencies/Facility Involved in Care: [facility]    Current number of days in facility:8 days    Expected length of stay at facility and/or expected discharge day from facility: Per nurse there is no timeframe of discharge yet    Was facility reminded that Navigator or ED should be called PRIOR to any return to the hospital in an effort to see if other interventions could be put in place to prevent a readmission (Y/N): Yes      Needs    On engagement call #2:  Discussed 90 Day Commitment to Care Document with facility staff and/or patient/caregiver:  Yes, discussed commitment to care in previous call     Follow up needs: no     Barriers or concerns to continued wellness in the community: no    PCP or facility physician notified for any reason: no    Referrals placed: no    Interventions or actions taken: no    Follow up plan: follow up with donna or esther      Comments    Navigator contacted [place] [place] [facility] [place] @ [phone] and spoke with nurse, Sherlynn Stalls. This Probation officer provided nurse with navigator's role description, and length of services. Per nurse pt is doing ok, no complains or pain or SOB. Per nurse the only problem is the patient does not speak english, but he can follow simple instructions. Nurse stated there is no discharge timeframe planned. This Probation officer notified nurse to contact navigator prior to patient being readmitted to the hospital. Navigator will follow up per MFN protocol.    Anne Shutter, BS  TCM Medicare Navigator   [facility]  [address]  [place] [address]  T [phone] F [phone]

## 2016-09-01 ENCOUNTER — Ambulatory Visit: Payer: Medicare Other | Attending: Internal Medicine

## 2016-09-01 ENCOUNTER — Other Ambulatory Visit: Payer: Self-pay | Admitting: Internal Medicine

## 2016-09-01 ENCOUNTER — Ambulatory Visit: Payer: Medicare Other

## 2016-09-01 DIAGNOSIS — I509 Heart failure, unspecified: Secondary | ICD-10-CM

## 2016-09-01 LAB — COMPREHENSIVE METABOLIC PANEL
ALT: 13 U/L (ref 0–55)
AST (SGOT): 16 U/L (ref 5–34)
Albumin/Globulin Ratio: 1.3 (ref 0.9–2.2)
Albumin: 2.8 g/dL — ABNORMAL LOW (ref 3.5–5.0)
Alkaline Phosphatase: 71 U/L (ref 38–106)
Anion Gap: 11 (ref 5.0–15.0)
BUN: 32 mg/dL — ABNORMAL HIGH (ref 9.0–28.0)
Bilirubin, Total: 0.3 mg/dL (ref 0.2–1.2)
CO2: 27 mEq/L (ref 22–29)
Calcium: 8.6 mg/dL (ref 7.9–10.2)
Chloride: 107 mEq/L (ref 100–111)
Creatinine: 1.6 mg/dL — ABNORMAL HIGH (ref 0.7–1.3)
Globulin: 2.2 g/dL (ref 2.0–3.6)
Glucose: 70 mg/dL (ref 70–100)
Potassium: 4 mEq/L (ref 3.5–5.1)
Protein, Total: 5 g/dL — ABNORMAL LOW (ref 6.0–8.3)
Sodium: 145 mEq/L (ref 136–145)

## 2016-09-01 LAB — CBC
Absolute NRBC: 0 10*3/uL
Hematocrit: 26.6 % — ABNORMAL LOW (ref 42.0–52.0)
Hgb: 8.7 g/dL — ABNORMAL LOW (ref 13.0–17.0)
MCH: 30.3 pg (ref 28.0–32.0)
MCHC: 32.7 g/dL (ref 32.0–36.0)
MCV: 92.7 fL (ref 80.0–100.0)
MPV: 10 fL (ref 9.4–12.3)
Nucleated RBC: 0 /100 WBC (ref 0.0–1.0)
Platelets: 216 10*3/uL (ref 140–400)
RBC: 2.87 10*6/uL — ABNORMAL LOW (ref 4.70–6.00)
RDW: 16 % — ABNORMAL HIGH (ref 12–15)
WBC: 5.69 10*3/uL (ref 3.50–10.80)

## 2016-09-01 LAB — GFR: EGFR: 41

## 2016-09-03 ENCOUNTER — Other Ambulatory Visit: Payer: Self-pay | Admitting: Internal Medicine

## 2016-09-03 ENCOUNTER — Ambulatory Visit: Payer: Medicare Other | Attending: Internal Medicine

## 2016-09-03 ENCOUNTER — Ambulatory Visit: Payer: Medicare Other

## 2016-09-03 DIAGNOSIS — E11649 Type 2 diabetes mellitus with hypoglycemia without coma: Secondary | ICD-10-CM

## 2016-09-03 LAB — GLUCOSE, RANDOM
Glucose: 190 mg/dL — ABNORMAL HIGH (ref 70–100)
Glucose: 48 mg/dL — CL (ref 70–100)

## 2016-09-07 ENCOUNTER — Encounter (INDEPENDENT_AMBULATORY_CARE_PROVIDER_SITE_OTHER): Payer: Self-pay

## 2016-09-07 NOTE — Progress Notes (Signed)
TCM Medicare Focus Diagnosis Navigator  Follow-Up Call    Call participant, relationship to patient and contact information: Nurse Patty from [facility]      Patient Information    Agencies/Facility Involved in Care:  nursing and rehab    Current number of days in facility:15 days    Expected length of stay at facility and/or expected discharge day from facility: Per nurse there is no timeframe of discharge yet    Was facility reminded that Navigator or ED should be called PRIOR to any return to the hospital in an effort to see if other interventions could be put in place to prevent a readmission (Y/N): Yes      Needs         Follow up needs: no        Barriers or concerns to continued wellness in the community: no    PCP or facility physician notified for any reason: no    Referrals placed: no    Interventions or actions taken: no    Follow up plan: follow up with donna or esther      Comments    [facility] @ [phone] and spoke with nurse, Chong Sicilian. Per nurse patient is doing fine and is participating in therapy. Nurse stated there pt does not have any complaints or needs. Per nurse pt does not say much. Nurse stated there is no discharge date for the patient. This Probation officer notified nurse to contact navigator prior to patient being readmitted to the hospital. Navigator will follow up per MFN protocol.    Anne Shutter, BS  TCM Medicare Navigator   [facility]  [address]  [place] [address]  T [phone] F [phone]

## 2016-09-08 ENCOUNTER — Ambulatory Visit: Payer: Medicare Other | Attending: Internal Medicine

## 2016-09-08 ENCOUNTER — Other Ambulatory Visit: Payer: Self-pay | Admitting: Internal Medicine

## 2016-09-08 DIAGNOSIS — I5033 Acute on chronic diastolic (congestive) heart failure: Secondary | ICD-10-CM

## 2016-09-08 LAB — CBC
Absolute NRBC: 0 10*3/uL
Hematocrit: 29 % — ABNORMAL LOW (ref 42.0–52.0)
Hgb: 9.4 g/dL — ABNORMAL LOW (ref 13.0–17.0)
MCH: 30.3 pg (ref 28.0–32.0)
MCHC: 32.4 g/dL (ref 32.0–36.0)
MCV: 93.5 fL (ref 80.0–100.0)
MPV: 9.9 fL (ref 9.4–12.3)
Nucleated RBC: 0 /100 WBC (ref 0.0–1.0)
Platelets: 240 10*3/uL (ref 140–400)
RBC: 3.1 10*6/uL — ABNORMAL LOW (ref 4.70–6.00)
RDW: 16 % — ABNORMAL HIGH (ref 12–15)
WBC: 5.83 10*3/uL (ref 3.50–10.80)

## 2016-09-08 LAB — BASIC METABOLIC PANEL
Anion Gap: 11 (ref 5.0–15.0)
BUN: 32 mg/dL — ABNORMAL HIGH (ref 9.0–28.0)
CO2: 29 mEq/L (ref 22–29)
Calcium: 9.1 mg/dL (ref 7.9–10.2)
Chloride: 103 mEq/L (ref 100–111)
Creatinine: 1.7 mg/dL — ABNORMAL HIGH (ref 0.7–1.3)
Glucose: 85 mg/dL (ref 70–100)
Potassium: 4 mEq/L (ref 3.5–5.1)
Sodium: 143 mEq/L (ref 136–145)

## 2016-09-08 LAB — GFR: EGFR: 38.2

## 2016-09-16 ENCOUNTER — Telehealth (INDEPENDENT_AMBULATORY_CARE_PROVIDER_SITE_OTHER): Payer: Self-pay

## 2016-09-16 NOTE — Telephone Encounter (Signed)
TCM Medicare Navigator    Navigator contacted [facility] @ [phone] and left a VM to [facility] with navigators contact information.    Navigator will discharge pt from the [facility] program on 6/15 due to completion of pillars.    Anne Shutter, BS  TCM Medicare Navigator   [facility]  [address]  [place] [address]  T [phone] F [phone]

## 2016-09-24 ENCOUNTER — Other Ambulatory Visit (INDEPENDENT_AMBULATORY_CARE_PROVIDER_SITE_OTHER): Payer: Self-pay

## 2016-09-24 DIAGNOSIS — IMO0002 Reserved for concepts with insufficient information to code with codable children: Secondary | ICD-10-CM

## 2016-09-24 MED ORDER — INSULIN PEN NEEDLE 31G X 5 MM MISC
0 refills | Status: DC
Start: 2016-09-24 — End: 2016-10-19

## 2016-09-24 NOTE — Telephone Encounter (Signed)
LOV 08/04/15    Currently in [place] ordered diabetes meds prior to discharge from hospital in May

## 2016-09-25 ENCOUNTER — Encounter (INDEPENDENT_AMBULATORY_CARE_PROVIDER_SITE_OTHER): Payer: Self-pay

## 2016-09-25 ENCOUNTER — Ambulatory Visit (INDEPENDENT_AMBULATORY_CARE_PROVIDER_SITE_OTHER): Payer: Medicare Other | Admitting: Registered Nurse

## 2016-09-25 VITALS — BP 132/70 | HR 90 | Temp 97.5°F | Resp 18 | Ht 62.0 in | Wt 147.4 lb

## 2016-09-25 DIAGNOSIS — N184 Chronic kidney disease, stage 4 (severe): Secondary | ICD-10-CM

## 2016-09-25 DIAGNOSIS — IMO0002 Reserved for concepts with insufficient information to code with codable children: Secondary | ICD-10-CM

## 2016-09-25 DIAGNOSIS — R0982 Postnasal drip: Secondary | ICD-10-CM

## 2016-09-25 DIAGNOSIS — Z794 Long term (current) use of insulin: Secondary | ICD-10-CM

## 2016-09-25 DIAGNOSIS — E1122 Type 2 diabetes mellitus with diabetic chronic kidney disease: Secondary | ICD-10-CM

## 2016-09-25 DIAGNOSIS — E1165 Type 2 diabetes mellitus with hyperglycemia: Secondary | ICD-10-CM

## 2016-09-25 NOTE — Progress Notes (Signed)
[facility]    PROGRESS NOTE      Patient: [facility]   Date: 09/25/2016   MRN: [id number]     Past Medical History:   Diagnosis Date   . Congestive heart failure (CHF)    . Diabetes mellitus    . Gout     chronic   . Hypertension      Social History     Social History   . Marital status: Married     Spouse name: N/A   . Number of children: N/A   . Years of education: N/A     Occupational History   . Not on file.     Social History Main Topics   . Smoking status: Never Smoker   . Smokeless tobacco: Never Used   . Alcohol use No      Comment: former alcoholic   . Drug use: No   . Sexual activity: Not on file     Other Topics Concern   . Not on file     Social History Narrative   . No narrative on file     History reviewed. No pertinent family history.    ASSESSMENT/PLAN     [facility] is a 83 y.o. male    Chief Complaint   Patient presents with   . URI        1. PND (post-nasal drip)    2. Uncontrolled type 2 diabetes mellitus with stage 4 chronic kidney disease, with long-term current use of insulin    Claritin 10mg  once a day. The insulin pen needle was refilled yesterday. F/U with PCP as scheduled.     Results for orders placed or performed in visit on 09/08/16   CBC w/o diff   Result Value Ref Range    WBC 5.83 3.50 - 10.80 x10 3/uL    Hgb 9.4 (L) 13.0 - 17.0 g/dL    Hematocrit 29.0 (L) 42.0 - 52.0 %    Platelets 240 140 - 400 x10 3/uL    RBC 3.10 (L) 4.70 - 6.00 x10 6/uL    MCV 93.5 80.0 - 100.0 fL    MCH 30.3 28.0 - 32.0 pg    MCHC 32.4 32.0 - 36.0 g/dL    RDW 16 (H) 12 - 15 %    MPV 9.9 9.4 - 12.3 fL    Nucleated RBC 0.0 0.0 - 1.0 /100 WBC    Absolute NRBC 0.00 0 x10 3/uL   Basic metabolic panel   Result Value Ref Range    Glucose 85 70 - 100 mg/dL    BUN 32.0 (H) 9.0 - 28.0 mg/dL    Creatinine 1.7 (H) 0.7 - 1.3 mg/dL    Calcium 9.1 7.9 - 10.2 mg/dL    Sodium 143 136 - 145 mEq/L    Potassium 4.0 3.5 - 5.1 mEq/L    Chloride 103 100 - 111 mEq/L    CO2 29 22 - 29 mEq/L    Anion Gap 11.0 5.0 - 15.0   GFR    Result Value Ref Range    EGFR 38.2        Risk & Benefits of the new medication(s) were explained to the patient (and family) who verbalized understanding & agreed to the treatment plan. Patient (family) encouraged to contact me/clinical staff with any questions/concerns      MEDICATIONS     Current Outpatient Prescriptions   Medication Sig Dispense Refill   . allopurinol (ZYLOPRIM) 100 MG  tablet Take 1 tablet (100 mg total) by mouth daily. 30 tablet 0   . atorvastatin (LIPITOR) 20 MG tablet Take 20 mg by mouth daily.     . chlorthalidone (HYGROTEN) 50 MG tablet TAKE ONE TABLET BY MOUTH EVERY DAY IN THE MORNING WITH FOOD  5   . cyclobenzaprine (FLEXERIL) 10 MG tablet Take 1 tablet (10 mg total) by mouth every 8 (eight) hours as needed for Muscle spasms. 30 tablet 3   . docusate sodium (COLACE) 100 MG capsule Take 100 mg by mouth 2 (two) times daily.     . furosemide (LASIX) 20 MG tablet Take 40 mg by mouth daily.     Marland Kitchen gabapentin (NEURONTIN) 100 MG capsule TAKE ONE CAPSULE BY MOUTH EVERY 8 HOURS  5   . glucose blood (TRUE METRIX BLOOD GLUCOSE TEST) test strip 1 each by Other route 2 (two) times daily. Use as instructed 200 each PRN   . hydrALAZINE (APRESOLINE) 50 MG tablet Take 50 mg by mouth daily.     . insulin glargine (LANTUS) 100 UNIT/ML injection Inject 20 Units into the skin nightly.     . insulin lispro (HUMALOG) 100 UNIT/ML injection Inject 1-3 Units into the skin nightly as needed (High blood glucose. See administration instructions.).     Marland Kitchen insulin lispro (HUMALOG) 100 UNIT/ML injection Inject 1-5 Units into the skin 3 (three) times daily before meals as needed (High blood glucose. See administration instructions.).     . Insulin Pen Needle 31G X 5 MM Misc INJECT INSULIN DAILY AS DIRECTED 100 each 0   . Linagliptin 5 MG Tab Take 5 mg by mouth daily.         . metoprolol succinate XL (TOPROL-XL) 25 MG 24 hr tablet Take 25 mg by mouth daily.     Marland Kitchen NIFEdipine (PROCARDIA XL) 60 MG 24 hr tablet TAKE 2  TABLETS BY MOUTH DAILY  4   . pantoprazole (PROTONIX) 40 MG tablet TAKE ONE TABLET BY MOUTH EVERY DAY  3   . predniSONE (DELTASONE) 5 MG tablet Take 5 mg by mouth daily.     . sodium bicarbonate 650 MG tablet Take 2 tablets (1,300 mg total) by mouth 2 (two) times daily. 120 tablet 0   . valsartan (DIOVAN) 80 MG tablet Take 80 mg by mouth daily.     Marland Kitchen lactobacillus/streptococcus (RISAQUAD) Cap Take 1 capsule by mouth daily. 30 capsule 0   . lidocaine (LIDODERM) 5 % Place 1 patch onto the skin daily.Remove & Discard patch within 12 hours or as directed by MD 15 patch 0     No current facility-administered medications for this visit.        No Known Allergies    SUBJECTIVE     Chief Complaint   Patient presents with   . URI        New meds: valsartan, metoprolol, hydralazine, lasix, prednisone, lipitor. Patient has been taking these for one week.  Patient discharged on 09/14/16 from rehab for knee pain.    Daughter is family historian, translating in [place] for patient. Daughter declined Geologist, engineering.    Patient has pacemaker that was put in in Dec 2017.    Ambulates with cane      URI    This is a new problem. The current episode started in the past 7 days (cough for one week). The problem has been rapidly worsening. There has been no fever. Associated symptoms include coughing (due to postnasal drip, no mucus),  joint swelling, rhinorrhea and sneezing. Pertinent negatives include no abdominal pain, chest pain, congestion, diarrhea, dysuria, ear pain, headaches, joint pain, nausea, neck pain, plugged ear sensation, rash, sinus pain, sore throat, swollen glands, vomiting or wheezing. Associated symptoms comments: Right knee pain greater than left knee pain. Patient feels some sharp pain in his throat when he is coughing. He has tried acetaminophen for the symptoms. The treatment provided no relief.     Pt has postnasal drip for one week after discharged from Rehab. Denied chest pain or SOB. Pt also needs insulin  pen needle refilled  ROS     Review of Systems   Constitutional: Negative for appetite change, chills, diaphoresis, fatigue and fever.   HENT: Positive for postnasal drip, rhinorrhea and sneezing. Negative for congestion, ear pain, sinus pain, sinus pressure, sore throat and trouble swallowing.    Respiratory: Positive for cough (due to postnasal drip, no mucus). Negative for chest tightness, shortness of breath and wheezing.    Cardiovascular: Negative for chest pain.   Gastrointestinal: Negative for abdominal distention, abdominal pain, diarrhea, nausea and vomiting.   Genitourinary: Negative for dysuria.   Musculoskeletal: Negative for joint pain and neck pain.   Skin: Negative for rash.   Neurological: Negative for dizziness and headaches.       The following sections were reviewed this encounter by the provider:   Tobacco  Allergies  Meds  Problems  Med Hx  Surg Hx  Fam Hx  Soc Hx          PHYSICAL EXAM     Vitals:    09/25/16 0937   BP: 132/70   BP Site: Right arm   Patient Position: Sitting   Cuff Size: Medium   Pulse: 90   Resp: 18   Temp: 97.5 F (36.4 C)   TempSrc: Oral   Weight: 66.9 kg (147 lb 6.4 oz)   Height: 1.575 m (5\' 2" )       Physical Exam   Constitutional: He is oriented to person, place, and time. He appears well-developed and well-nourished. No distress.   HENT:   Head: Normocephalic.   Right Ear: Tympanic membrane, external ear and ear canal normal.   Left Ear: Tympanic membrane, external ear and ear canal normal.   Nose: Nose normal.   Mouth/Throat: Uvula is midline and mucous membranes are normal. No oropharyngeal exudate, posterior oropharyngeal edema or posterior oropharyngeal erythema (postnasal drip noted).   Eyes: Conjunctivae and EOM are normal. Pupils are equal, round, and reactive to light.   Neck: Normal range of motion. Neck supple.   Cardiovascular: Normal rate, regular rhythm and normal heart sounds.    No murmur heard.  Pulmonary/Chest: Effort normal and breath sounds  normal. No respiratory distress.   Neurological: He is alert and oriented to person, place, and time.   Psychiatric: He has a normal mood and affect. His behavior is normal. Thought content normal.   Nursing note and vitals reviewed.    Ortho Exam  Neurologic Exam     Mental Status   Oriented to person, place, and time.     Cranial Nerves     CN III, IV, VI   Pupils are equal, round, and reactive to light.  Extraocular motions are normal.       PROCEDURE(S)     Procedures        Signed,  Rosalio Macadamia, FNP  09/25/2016

## 2016-10-01 ENCOUNTER — Ambulatory Visit (INDEPENDENT_AMBULATORY_CARE_PROVIDER_SITE_OTHER): Payer: Medicare Other | Admitting: Cardiovascular Disease

## 2016-10-01 ENCOUNTER — Encounter (INDEPENDENT_AMBULATORY_CARE_PROVIDER_SITE_OTHER): Payer: Self-pay | Admitting: Cardiovascular Disease

## 2016-10-01 VITALS — BP 120/56 | HR 66 | Temp 97.8°F | Resp 17 | Ht 60.0 in | Wt 145.0 lb

## 2016-10-01 DIAGNOSIS — M545 Low back pain: Secondary | ICD-10-CM

## 2016-10-01 DIAGNOSIS — E119 Type 2 diabetes mellitus without complications: Secondary | ICD-10-CM

## 2016-10-01 DIAGNOSIS — I5032 Chronic diastolic (congestive) heart failure: Secondary | ICD-10-CM

## 2016-10-01 DIAGNOSIS — I10 Essential (primary) hypertension: Secondary | ICD-10-CM

## 2016-10-01 DIAGNOSIS — Z794 Long term (current) use of insulin: Secondary | ICD-10-CM

## 2016-10-01 DIAGNOSIS — I5022 Chronic systolic (congestive) heart failure: Secondary | ICD-10-CM

## 2016-10-01 DIAGNOSIS — M1A9XX0 Chronic gout, unspecified, without tophus (tophi): Secondary | ICD-10-CM

## 2016-10-01 DIAGNOSIS — Z950 Presence of cardiac pacemaker: Secondary | ICD-10-CM

## 2016-10-01 DIAGNOSIS — N184 Chronic kidney disease, stage 4 (severe): Secondary | ICD-10-CM

## 2016-10-01 DIAGNOSIS — E782 Mixed hyperlipidemia: Secondary | ICD-10-CM

## 2016-10-01 DIAGNOSIS — M79604 Pain in right leg: Secondary | ICD-10-CM

## 2016-10-01 DIAGNOSIS — M170 Bilateral primary osteoarthritis of knee: Secondary | ICD-10-CM

## 2016-10-01 DIAGNOSIS — M353 Polymyalgia rheumatica: Secondary | ICD-10-CM

## 2016-10-01 DIAGNOSIS — K219 Gastro-esophageal reflux disease without esophagitis: Secondary | ICD-10-CM

## 2016-10-01 MED ORDER — CYCLOBENZAPRINE HCL 10 MG PO TABS
10.0000 mg | ORAL_TABLET | Freq: Three times a day (TID) | ORAL | 5 refills | Status: DC | PRN
Start: 2016-10-01 — End: 2016-12-16

## 2016-10-01 MED ORDER — LINAGLIPTIN 5 MG PO TABS
5.0000 mg | ORAL_TABLET | Freq: Every day | ORAL | 3 refills | Status: DC
Start: 2016-10-01 — End: 2017-10-25

## 2016-10-01 MED ORDER — SODIUM BICARBONATE 650 MG PO TABS
1300.0000 mg | ORAL_TABLET | Freq: Two times a day (BID) | ORAL | 5 refills | Status: DC
Start: 2016-10-01 — End: 2016-11-15

## 2016-10-01 MED ORDER — PREDNISONE 5 MG PO TABS
5.0000 mg | ORAL_TABLET | Freq: Every day | ORAL | 1 refills | Status: DC
Start: 2016-10-01 — End: 2017-03-26

## 2016-10-01 MED ORDER — VALSARTAN 80 MG PO TABS
80.0000 mg | ORAL_TABLET | Freq: Every day | ORAL | 3 refills | Status: DC
Start: 2016-10-01 — End: 2018-01-10

## 2016-10-01 MED ORDER — GABAPENTIN 100 MG PO CAPS
ORAL_CAPSULE | ORAL | 5 refills | Status: DC
Start: 2016-10-01 — End: 2017-04-07

## 2016-10-01 MED ORDER — ATORVASTATIN CALCIUM 20 MG PO TABS
20.0000 mg | ORAL_TABLET | Freq: Every day | ORAL | 3 refills | Status: DC
Start: 2016-10-01 — End: 2017-10-25

## 2016-10-01 MED ORDER — METOPROLOL SUCCINATE ER 25 MG PO TB24
25.0000 mg | ORAL_TABLET | Freq: Every day | ORAL | 3 refills | Status: DC
Start: 2016-10-01 — End: 2017-04-29

## 2016-10-01 MED ORDER — NIFEDIPINE ER OSMOTIC RELEASE 60 MG PO TB24
ORAL_TABLET | ORAL | 3 refills | Status: DC
Start: 2016-10-01 — End: 2017-04-07

## 2016-10-01 MED ORDER — ALLOPURINOL 100 MG PO TABS
100.0000 mg | ORAL_TABLET | Freq: Every day | ORAL | 3 refills | Status: DC
Start: 2016-10-01 — End: 2017-10-25

## 2016-10-01 MED ORDER — PANTOPRAZOLE SODIUM 40 MG PO TBEC
DELAYED_RELEASE_TABLET | ORAL | 3 refills | Status: DC
Start: 2016-10-01 — End: 2017-10-04

## 2016-10-01 MED ORDER — CHLORTHALIDONE 50 MG PO TABS
ORAL_TABLET | ORAL | 3 refills | Status: DC
Start: 2016-10-01 — End: 2017-06-10

## 2016-10-01 MED ORDER — HYDRALAZINE HCL 50 MG PO TABS
50.0000 mg | ORAL_TABLET | Freq: Every morning | ORAL | 3 refills | Status: DC
Start: 2016-10-01 — End: 2017-09-24

## 2016-10-01 MED ORDER — FUROSEMIDE 20 MG PO TABS
40.0000 mg | ORAL_TABLET | Freq: Every day | ORAL | 3 refills | Status: DC
Start: 2016-10-01 — End: 2017-06-10

## 2016-10-01 MED ORDER — SODIUM BICARBONATE 650 MG PO TABS
1300.0000 mg | ORAL_TABLET | Freq: Two times a day (BID) | ORAL | 0 refills | Status: DC
Start: 2016-10-01 — End: 2016-10-01

## 2016-10-01 NOTE — Progress Notes (Signed)
Subjective:      Date: 10/01/2016 8:01 PM   Patient ID: [facility] is a 83 y.o. male.    Chief Complaint:  Chief Complaint   Patient presents with   . Knee Pain     DISCHARGED FROM REHAB 09/21/2016       HPI:  Patient had recently been discharged from [facility] for bilateral knee pain 09/20/2016- patient states when he walks he still has pain. Patient has been seen by Dr. Earley Favor then his daughter wanted him to go to Dr. Thersa Salt- Patient want to change PCP due to Dr, Odetta Pink is to far to travel.     The patient is an 32 year [place] man who presents for follow up after he was discharged from [facility]. His daughter, [name], accompanies him and his helping with translation from [place] into [place].   He was in Rehab from  08/06/2016 to 08/24/2016.  He had been hospitalized from 08/18/2016 to 08/23/2016 at [facility] for treatment of multiple problems including the following:    He also has a past medical history of diffuse osteoarthritis, diastolic heart failure, chronic kidney disease.  Hyperglycemia  IDDM - glucose still not well controlled,  but he is on steroids as well.  Lantus to 20 units SQ qHS and adjust as needed   Polymyalgia rheumatica  During previous admission patient presented with b/l shoulder and hip pain and decreased ROM, which have significantly improved after taking steroids.  He continues to take prednisone 5 mg PO daily and is having  Home PT/OT twice a week.   Leukocytosis - likely due to steroids-  Chronic diastolic heart failure with severe MR - stable  Chronic kidney disease stage 3- at baseline   Gout -   HTN   Hyperlipidemia   Chronic anemia.    The patient went to see Dr. Odetta Pink and seeing Dr. Earley Favor because his daughter wanted him to see "someone" who could specialize in everything.    Today he is feeling better, but continues to have chronic joint pain that is likely due to a combination of osteoarthritis and polymyalgia  rheumatica.   His daughter was thinking of taking him back to see the orthopedic surgeon, Dr. Andree Elk, to see if her father would benefit from "injections into the knees."    The patient  And his wife live with their youngest daughter, [facility], in [place], [place].     Problem List:  Patient Active Problem List   Diagnosis   . SOB (shortness of breath)   . ARF (acute renal failure)   . Pulmonary edema   . Acute pulmonary edema   . Acute renal failure, unspecified acute renal failure type   . CKD (chronic kidney disease), unspecified stage   . Acute diastolic heart failure   . Bruce discharge follow-up   . Peripheral edema   . Pneumonia due to infectious organism, unspecified laterality, unspecified part of lung   . Chronic diastolic heart failure   . CKD (chronic kidney disease) stage 4, GFR 15-29 ml/min   . Chronic pain syndrome   . Arthritis   . Uncontrolled type 2 diabetes with renal manifestation   . Renovascular hypertension   . NSTEMI (non-ST elevated myocardial infarction)   . CHF (congestive heart failure)   . Acute renal failure superimposed on stage 3 chronic kidney disease   . Hyponatremia   . Normocytic anemia   . HTN (hypertension)   .  Diabetes mellitus, type II   . OA (osteoarthritis)   . Acute on chronic diastolic congestive heart failure   . Fever and chills   . Weakness       Current Medications:  Current Outpatient Prescriptions   Medication Sig Dispense Refill   . allopurinol (ZYLOPRIM) 100 MG tablet Take 1 tablet (100 mg total) by mouth daily. 90 tablet 3   . atorvastatin (LIPITOR) 20 MG tablet Take 1 tablet (20 mg total) by mouth daily. 90 tablet 3   . chlorthalidone (HYGROTEN) 50 MG tablet TAKE ONE TABLET BY MOUTH EVERY DAY IN THE MORNING WITH FOOD 90 tablet 3   . cyclobenzaprine (FLEXERIL) 10 MG tablet Take 1 tablet (10 mg total) by mouth every 8 (eight) hours as needed for Muscle spasms. 30 tablet 5   . docusate sodium (COLACE) 100 MG capsule Take 100 mg by mouth 2 (two) times daily.     .  furosemide (LASIX) 20 MG tablet Take 2 tablets (40 mg total) by mouth daily. 180 tablet 3   . gabapentin (NEURONTIN) 100 MG capsule TAKE ONE CAPSULE BY MOUTH EVERY 8 HOURS 90 capsule 5   . glucose blood (TRUE METRIX BLOOD GLUCOSE TEST) test strip 1 each by Other route 2 (two) times daily. Use as instructed 200 each PRN   . hydrALAZINE (APRESOLINE) 50 MG tablet Take 1 tablet (50 mg total) by mouth every morning. 90 tablet 3   . insulin glargine (LANTUS) 100 UNIT/ML injection Inject 20 Units into the skin nightly.     . insulin lispro (HUMALOG) 100 UNIT/ML injection Inject 1-3 Units into the skin nightly as needed (High blood glucose. See administration instructions.).     Marland Kitchen insulin lispro (HUMALOG) 100 UNIT/ML injection Inject 1-5 Units into the skin 3 (three) times daily before meals as needed (High blood glucose. See administration instructions.).     . Insulin Pen Needle 31G X 5 MM Misc INJECT INSULIN DAILY AS DIRECTED 100 each 0   . lactobacillus/streptococcus (RISAQUAD) Cap Take 1 capsule by mouth daily. 30 capsule 0   . lidocaine (LIDODERM) 5 % Place 1 patch onto the skin daily.Remove & Discard patch within 12 hours or as directed by MD 15 patch 0   . Linagliptin 5 MG Tab Take 1 tablet (5 mg total) by mouth daily. 90 tablet 3   . metoprolol succinate XL (TOPROL-XL) 25 MG 24 hr tablet Take 1 tablet (25 mg total) by mouth daily. 90 tablet 3   . NIFEdipine (PROCARDIA XL) 60 MG 24 hr tablet TAKE 2 TABLETS BY MOUTH DAILY 90 tablet 3   . pantoprazole (PROTONIX) 40 MG tablet TAKE ONE TABLET BY MOUTH EVERY DAY 90 tablet 3   . predniSONE (DELTASONE) 5 MG tablet Take 1 tablet (5 mg total) by mouth daily. 90 tablet 1   . sodium bicarbonate 650 MG tablet Take 2 tablets (1,300 mg total) by mouth 2 (two) times daily. 120 tablet 5   . valsartan (DIOVAN) 80 MG tablet Take 1 tablet (80 mg total) by mouth daily. 90 tablet 3     No current facility-administered medications for this visit.        Allergies:  No Known  Allergies    Past Medical History:  Past Medical History:   Diagnosis Date   . Congestive heart failure (CHF)    . Diabetes mellitus    . Gout     chronic   . Hypertension        Past  Surgical History:  Past Surgical History:   Procedure Laterality Date   . ABDOMINAL SURGERY     . PACEMAKER Left 2017       Family History:  History reviewed. No pertinent family history.    Social History:  Social History     Social History   . Marital status: Married     Spouse name: N/A   . Number of children: N/A   . Years of education: N/A     Occupational History   . Not on file.     Social History Main Topics   . Smoking status: Never Smoker   . Smokeless tobacco: Never Used   . Alcohol use No      Comment: former alcoholic   . Drug use: No   . Sexual activity: Not on file     Other Topics Concern   . Not on file     Social History Narrative   . No narrative on file       The following sections were reviewed this encounter by the provider:   Allergies  Meds         Vitals:  BP 120/56 (BP Site: Right arm, Patient Position: Sitting, Cuff Size: Medium)   Pulse 66   Temp 97.8 F (36.6 C) (Oral)   Resp 17   Ht 1.524 m (5')   Wt 65.8 kg (145 lb)   SpO2 96%   BMI 28.32 kg/m        ROS:  Constitutional: Negative for fever, chills. Positive for weight loss of 10-15 lbs since 08/18/2016, but his daughter states that Mr. Vandeberg has had a good appetite  HENT: Negative for nasal congestion,  ear congestion, hearing loss. Positive for a mildly runny nose.    Eyes: Negative for  double vision. Positive for chronic blurry vision in both eyes.   Respiratory: Positive for occasional cough with a runny nose. Negative for shortness of breath.  Cardiovascular: Negative for chest pain, palpitations.  When he sleeps he has some mild pain around the pacemaker (dual chamber) site, which was placed in [place] in 2018.   Gastrointestinal: Negative for abdominal pain, nausea, vomiting, reflux, diarrhea, constipation, melena, hematochezia.    Genitourinary: Negative for dysuria, urgency, frequency, hematuria.   Musculoskeletal: Negative for myalgias, back pain. Positive for b/l joint pain.   Skin: Negative for itching, rash, urticaria.   Neurological: Negative for dizziness, lightheadedness, loss of consciousness, headaches.  Psychiatric/Behavioral: Negative for depression, anxiety. Occasional insomnia.         Objective:       Physical Exam:  Constitutional: Well-developed, well-nourished. In no acute distress.   Head: Atraumatic. Normocephalic.  Right Ear: Tympanic membrane normal. External canal normal. No pain with movement of pinna.  Left Ear: Tympanic membrane normal.  External canal normal. No pain with movement of pinna.  Nose: Normal septum, normal nasal mucosa. No exudate.  No masses or polyps.  Mouth/Throat: Mucous membranes are moist. Normal buccal mucosa. Oropharynx is clear. He has No teeth and his gums have receded, but the gums are healthy.   Eyes: Pupils are equal, round, and reactive to light. Extra ocular muscles are intact.  Normal conjunctivae.  Neck: Normal range of motion. Neck supple. No carotid bruits.  No thyroid masses. No cervical lymphadenopathy.  Cardiovascular: Pacemaker is well situated in the left upper chest.  Normal rate.  Regular rhythm, no S3, normal S1, normal S2, no S4. 3/6 systolic murmur heard along the left sternal  border and radiates to the apex (Possible mitral stenosis and mitral regurgitation). Point of maximal intensity is at ICS#4 in the mid-clavicular line. No rubs, thrills, or lifts.   Pulmonary/Chest: Breath sounds normal. No wheezing, rhonchi, crackles, cough.   Abdominal: Bowel sounds are normal. Soft, non-tender, non-distended. No palpable masses. There is no rebound, rigidity, or guarding.   Musculoskeletal: Normal range of motion. No rigidity.   Neurological: Alert. Awake.  Oriented to person, place, and time. Normal gait.  Extremities: Normal distal pulses.  Grade 1 pitting edema in the lower  extremities (right > left).  No clubbing.  No cyanosis.  Skin: Skin is warm and moist. No exanthem.      Results for orders placed or performed in visit on 09/08/16   CBC w/o diff   Result Value Ref Range    WBC 5.83 3.50 - 10.80 x10 3/uL    Hgb 9.4 (L) 13.0 - 17.0 g/dL    Hematocrit 29.0 (L) 42.0 - 52.0 %    Platelets 240 140 - 400 x10 3/uL    RBC 3.10 (L) 4.70 - 6.00 x10 6/uL    MCV 93.5 80.0 - 100.0 fL    MCH 30.3 28.0 - 32.0 pg    MCHC 32.4 32.0 - 36.0 g/dL    RDW 16 (H) 12 - 15 %    MPV 9.9 9.4 - 12.3 fL    Nucleated RBC 0.0 0.0 - 1.0 /100 WBC    Absolute NRBC 0.00 0 x10 3/uL   Basic metabolic panel   Result Value Ref Range    Glucose 85 70 - 100 mg/dL    BUN 32.0 (H) 9.0 - 28.0 mg/dL    Creatinine 1.7 (H) 0.7 - 1.3 mg/dL    Calcium 9.1 7.9 - 10.2 mg/dL    Sodium 143 136 - 145 mEq/L    Potassium 4.0 3.5 - 5.1 mEq/L    Chloride 103 100 - 111 mEq/L    CO2 29 22 - 29 mEq/L    Anion Gap 11.0 5.0 - 15.0   GFR   Result Value Ref Range    EGFR 38.2         Assessment/Plan:       ECHOCARDIOGRAM ( 08/14/2016):  Dilated left ventricle with reduced left ventricular systolic function. Estimated ejection fraction of 40%.  Mild left ventricular hypertrophy.  Aortic valve sclerosis with mild aortic insufficiency.  Dilated aortic root.  Sclerotic mitral valve with moderately severe mitral regurgitation.  Moderate pulmonary hypertension.  Pacemaker wire noted.    Marnee Spring, MD   08/14/2016 2:10 PM    1. Chronic diastolic heart failure - stable on furosemide, metoprolol succinate, and diuretic therapy. He has not seen a cardiologist since he was hospitalized in [place] and had a pacemaker implanted.  - Cardiology Referral: Tariq A. Jessy Oto, MD ([place])    2. Pacemaker in the left upper chest is stable.    3. Type 2 diabetes mellitus without complication, with long-term current use of insulin - on Lantus insulin 20 units SQ qHS, and Linagliptin (Tradjenta) 5 mg PO daily.     4. Essential hypertension -  well controlled with Hydralazine, Valsartan, Furosemide, Chlorthalidone, Nifedipine.    5. Polymyalgia rheumatica syndrome - continue with Prednisone 5 mg PO daily.   - Ambulatory referral to Rheumatology - Dr. Derrick Ravel    6. CKD (chronic kidney disease), stage III -continue with sodium bicarbonate 650 mg/tablet, 2 tablets PO bid.  -  Ambulatory referral to Nephrology - Dr. Kate Sable.    7. Mixed hyperlipidemia - continue with atorvastatin 20 mg PO qHS.,    8. Osteoarthritis of both knees, unspecified osteoarthritis type  - Ambulatory referral to Rheumatology    9. Chronic systolic heart failure - stable on valsartan, furosemide, metoprolol succinate, and dual pacemaker therapy.     10. Lumbar pain with radiating pain down both legs.    11.  GERD - stable on pantoprazole 40 mg PO daily.     12. Chronic gout without tophus - stable on Allopurinol 100 mg PO daily.      Return in about 2 months (around 12/01/2016) for 45 min appt: F/U diastolic Heart failure, F/U type II DM, F/u PMR,/.     Over 60 minutes was spent in face-to-face discussion and physical examination of the patient. Multiple medical issues were addressed and a comprehensive treatment plan was developed as documented in the above Assessment / Plan #1 to 12.      Risk & Benefits of the new medication(s) were explained to the patient (and family) who verbalized understanding & agreed to the treatment plan. Patient (family) encouraged to contact me/clinical staff with any questions/concerns        Dorothe Pea Truman Hayward, DO

## 2016-10-07 ENCOUNTER — Encounter (INDEPENDENT_AMBULATORY_CARE_PROVIDER_SITE_OTHER): Payer: Self-pay | Admitting: Cardiovascular Disease

## 2016-10-14 ENCOUNTER — Encounter (INDEPENDENT_AMBULATORY_CARE_PROVIDER_SITE_OTHER): Payer: Self-pay | Admitting: Cardiovascular Disease

## 2016-10-19 ENCOUNTER — Other Ambulatory Visit (INDEPENDENT_AMBULATORY_CARE_PROVIDER_SITE_OTHER): Payer: Self-pay

## 2016-10-19 ENCOUNTER — Telehealth (INDEPENDENT_AMBULATORY_CARE_PROVIDER_SITE_OTHER): Payer: Self-pay | Admitting: Cardiovascular Disease

## 2016-10-19 DIAGNOSIS — IMO0002 Reserved for concepts with insufficient information to code with codable children: Secondary | ICD-10-CM

## 2016-10-19 NOTE — Telephone Encounter (Signed)
Lov 10/01/2016.

## 2016-10-19 NOTE — Telephone Encounter (Signed)
Name, strength, directions of requested refill(s):    Insulin Pen Needle 31G X 5 MM Misc  glucose blood (TRUE METRIX BLOOD GLUCOSE TEST) test strip    Pharmacy to send refill to or patient to pick up rx from office (mark requested pharmacy in BOLD):      [id number] - [place], [place]  [address]  [place] [address]  Phone: [phone] Fax: [phone]        Please mark "X" next to the preferred call back number:    Mobile:   Telephone Information:   Mobile [phone]         X   Home: [phone]    Work:           Next visit: 12/01/2016

## 2016-10-20 MED ORDER — INSULIN PEN NEEDLE 31G X 5 MM MISC
0 refills | Status: DC
Start: 2016-10-20 — End: 2017-05-05

## 2016-10-26 ENCOUNTER — Other Ambulatory Visit (INDEPENDENT_AMBULATORY_CARE_PROVIDER_SITE_OTHER): Payer: Self-pay | Admitting: Cardiovascular Disease

## 2016-10-26 MED ORDER — GLUCOSE BLOOD VI STRP
1.0000 | ORAL_STRIP | Freq: Two times a day (BID) | 99 refills | Status: DC
Start: 2016-10-26 — End: 2017-06-10

## 2016-10-26 NOTE — Telephone Encounter (Signed)
Per patient's daughter, patient is out of test strips. Can you please refill this for Dr. Truman Hayward since she is out of the office today? Thanks!

## 2016-10-26 NOTE — Telephone Encounter (Signed)
Dr. Herbert Pun refilled prescription.

## 2016-10-26 NOTE — Telephone Encounter (Signed)
Thank you Dr. Herbert Pun.        Sincerely,  Jennette Banker, DO

## 2016-10-28 ENCOUNTER — Encounter (INDEPENDENT_AMBULATORY_CARE_PROVIDER_SITE_OTHER): Payer: Self-pay | Admitting: Cardiovascular Disease

## 2016-11-03 ENCOUNTER — Encounter (INDEPENDENT_AMBULATORY_CARE_PROVIDER_SITE_OTHER): Payer: Self-pay | Admitting: Family

## 2016-11-03 ENCOUNTER — Ambulatory Visit (INDEPENDENT_AMBULATORY_CARE_PROVIDER_SITE_OTHER): Payer: Medicare Other | Admitting: Family

## 2016-11-03 VITALS — BP 112/50 | HR 69 | Temp 97.9°F | Resp 18 | Wt 155.0 lb

## 2016-11-03 DIAGNOSIS — B349 Viral infection, unspecified: Secondary | ICD-10-CM

## 2016-11-03 DIAGNOSIS — R05 Cough: Secondary | ICD-10-CM

## 2016-11-03 DIAGNOSIS — R0982 Postnasal drip: Secondary | ICD-10-CM

## 2016-11-03 DIAGNOSIS — R059 Cough, unspecified: Secondary | ICD-10-CM

## 2016-11-03 MED ORDER — FLUTICASONE PROPIONATE 50 MCG/ACT NA SUSP
1.0000 | Freq: Two times a day (BID) | NASAL | 0 refills | Status: DC
Start: 2016-11-03 — End: 2017-06-02

## 2016-11-03 NOTE — Progress Notes (Signed)
[facility]    PROGRESS NOTE      Patient: Marco Bruce   Date: 11/03/2016   MRN: [id number]     Past Medical History:   Diagnosis Date   . Congestive heart failure (CHF)    . Diabetes mellitus    . Gout     chronic   . Hypertension      Social History     Social History   . Marital status: Married     Spouse name: N/A   . Number of children: N/A   . Years of education: N/A     Occupational History   . Not on file.     Social History Main Topics   . Smoking status: Never Smoker   . Smokeless tobacco: Never Used   . Alcohol use No      Comment: former alcoholic   . Drug use: No   . Sexual activity: Not on file     Other Topics Concern   . Not on file     Social History Narrative   . No narrative on file     History reviewed. No pertinent family history.    ASSESSMENT/PLAN     Marco Bruce is a 83 y.o. male    Chief Complaint   Patient presents with   . Cough        1. Viral illness    2. Cough    3. Post-nasal drip  - fluticasone (FLONASE) 50 MCG/ACT nasal spray; 1 spray by Nasal route 2 (two) times daily.  Dispense: 1 Bottle; Refill: 0         Likely viral at this time. Reviewed physical exam with pt and fam member, Marco Bruce. Increase robitussin to every 4 hours as directed and start flonase BID to help with post nasal drainage. Drink plenty of fluids, get plenty of rest and practice good hand hygiene. Continue to take frequent deep breaths to prevent pneumonia and encourage lung expansion. RTC if symptoms are not improving or persist more than 1 week. Pt and fam member are agreeable with plan of care as discussed. May contact me/clinical staff with any questions/concerns. All questions answered today.       MEDICATIONS     Current Outpatient Prescriptions   Medication Sig Dispense Refill   . allopurinol (ZYLOPRIM) 100 MG tablet Take 1 tablet (100 mg total) by mouth daily. 90 tablet 3   . atorvastatin (LIPITOR) 20 MG tablet Take 1 tablet (20 mg total) by mouth daily. 90 tablet 3   . chlorthalidone (HYGROTEN) 50 MG  tablet TAKE ONE TABLET BY MOUTH EVERY DAY IN THE MORNING WITH FOOD 90 tablet 3   . cyclobenzaprine (FLEXERIL) 10 MG tablet Take 1 tablet (10 mg total) by mouth every 8 (eight) hours as needed for Muscle spasms. 30 tablet 5   . docusate sodium (COLACE) 100 MG capsule Take 100 mg by mouth 2 (two) times daily.     . furosemide (LASIX) 20 MG tablet Take 2 tablets (40 mg total) by mouth daily. 180 tablet 3   . gabapentin (NEURONTIN) 100 MG capsule TAKE ONE CAPSULE BY MOUTH EVERY 8 HOURS 90 capsule 5   . glucose blood (TRUE METRIX BLOOD GLUCOSE TEST) test strip 1 each by Other route 2 (two) times daily.Use as instructed 200 each PRN   . hydrALAZINE (APRESOLINE) 50 MG tablet Take 1 tablet (50 mg total) by mouth every morning. 90 tablet 3   . insulin glargine (LANTUS)  100 UNIT/ML injection Inject 20 Units into the skin nightly.     . insulin lispro (HUMALOG) 100 UNIT/ML injection Inject 1-3 Units into the skin nightly as needed (High blood glucose. See administration instructions.).     Marland Kitchen insulin lispro (HUMALOG) 100 UNIT/ML injection Inject 1-5 Units into the skin 3 (three) times daily before meals as needed (High blood glucose. See administration instructions.).     . Insulin Pen Needle 31G X 5 MM Misc INJECT INSULIN DAILY AS DIRECTED 100 each 0   . lactobacillus/streptococcus (RISAQUAD) Cap Take 1 capsule by mouth daily. 30 capsule 0   . lidocaine (LIDODERM) 5 % Place 1 patch onto the skin daily.Remove & Discard patch within 12 hours or as directed by MD 15 patch 0   . Linagliptin 5 MG Tab Take 1 tablet (5 mg total) by mouth daily. 90 tablet 3   . metoprolol succinate XL (TOPROL-XL) 25 MG 24 hr tablet Take 1 tablet (25 mg total) by mouth daily. 90 tablet 3   . NIFEdipine (PROCARDIA XL) 60 MG 24 hr tablet TAKE 2 TABLETS BY MOUTH DAILY 90 tablet 3   . pantoprazole (PROTONIX) 40 MG tablet TAKE ONE TABLET BY MOUTH EVERY DAY 90 tablet 3   . predniSONE (DELTASONE) 5 MG tablet Take 1 tablet (5 mg total) by mouth daily. 90  tablet 1   . sodium bicarbonate 650 MG tablet Take 2 tablets (1,300 mg total) by mouth 2 (two) times daily. 120 tablet 5   . valsartan (DIOVAN) 80 MG tablet Take 1 tablet (80 mg total) by mouth daily. 90 tablet 3   . fluticasone (FLONASE) 50 MCG/ACT nasal spray 1 spray by Nasal route 2 (two) times daily. 1 Bottle 0     No current facility-administered medications for this visit.        No Known Allergies    SUBJECTIVE     Chief Complaint   Patient presents with   . Cough        Cough   This is a new problem. Episode onset: 2 days. The problem has been unchanged. The cough is productive of sputum. Associated symptoms include headaches (started today, mild) and postnasal drip. Pertinent negatives include no chills, ear pain, eye redness, fever, heartburn, myalgias, nasal congestion, rash, rhinorrhea, sore throat, shortness of breath, sweats, weight loss or wheezing. Associated symptoms comments: Cough is worst when lying down. Treatments tried: robitussin morning and night. The treatment provided no relief. His past medical history is significant for pneumonia. There is no history of asthma, bronchiectasis, bronchitis, COPD, emphysema or environmental allergies.   denies hx of tobacco use.    ROS     Review of Systems   Constitutional: Negative.  Negative for appetite change, chills, diaphoresis, fatigue, fever and weight loss.   HENT: Positive for postnasal drip. Negative for congestion, ear pain, rhinorrhea, sinus pain, sinus pressure, sneezing, sore throat, tinnitus, trouble swallowing and voice change.    Eyes: Negative for pain, discharge, redness and itching.   Respiratory: Positive for cough (at times productive, green). Negative for chest tightness, shortness of breath and wheezing.    Cardiovascular: Negative.    Gastrointestinal: Negative.  Negative for heartburn.   Musculoskeletal: Negative for myalgias, neck pain and neck stiffness.   Skin: Negative.  Negative for rash.   Allergic/Immunologic: Negative for  environmental allergies.   Neurological: Positive for headaches (started today, mild). Negative for dizziness, facial asymmetry and light-headedness.       The following sections  were reviewed this encounter by the provider:   Tobacco  Allergies  Meds  Problems  Med Hx  Surg Hx  Fam Hx  Soc Hx          PHYSICAL EXAM     Vitals:    11/03/16 1252 11/03/16 1303   BP: 112/50    BP Site: Right arm    Patient Position: Sitting    Cuff Size: Medium    Pulse: 64 69   Resp: 18    Temp: 97.9 F (36.6 C)    TempSrc: Tympanic    SpO2: 96% 97%   Weight: 70.3 kg (155 lb)        Physical Exam   Constitutional: He is oriented to person, place, and time. He appears well-developed and well-nourished. No distress.   HENT:   Head: Normocephalic and atraumatic.   Right Ear: Tympanic membrane, external ear and ear canal normal.   Left Ear: Tympanic membrane, external ear and ear canal normal.   Nose: Mucosal edema (mild) present. No rhinorrhea. Right sinus exhibits no maxillary sinus tenderness and no frontal sinus tenderness. Left sinus exhibits no maxillary sinus tenderness and no frontal sinus tenderness.   Mouth/Throat: Uvula is midline, oropharynx is clear and moist and mucous membranes are normal. No uvula swelling. No oropharyngeal exudate, posterior oropharyngeal edema, posterior oropharyngeal erythema or tonsillar abscesses. Tonsils are 0 on the right. Tonsils are 0 on the left. No tonsillar exudate.   Mild clear post nasal drip   Eyes: Pupils are equal, round, and reactive to light. Conjunctivae and EOM are normal. Right eye exhibits no discharge. Left eye exhibits no discharge. No scleral icterus.   Neck: Normal range of motion. Neck supple.   Cardiovascular: Normal rate, regular rhythm, normal heart sounds and intact distal pulses.  Exam reveals no gallop and no friction rub.    No murmur heard.  Pulmonary/Chest: Effort normal and breath sounds normal. No respiratory distress. He has no wheezes. He has no rales. He  exhibits no tenderness.   Good air exchange in all fields   Lymphadenopathy:     He has no cervical adenopathy.   Neurological: He is alert and oriented to person, place, and time.   Skin: Skin is warm and dry. Capillary refill takes less than 2 seconds. No rash noted. He is not diaphoretic. No erythema. No pallor.   Psychiatric: He has a normal mood and affect. His behavior is normal.     Ortho Exam  Neurologic Exam     Mental Status   Oriented to person, place, and time.     Cranial Nerves     CN III, IV, VI   Pupils are equal, round, and reactive to light.  Extraocular motions are normal.       PROCEDURE(S)     Procedures        Signed,  Elease Etienne, FNP  11/03/2016

## 2016-11-03 NOTE — Patient Instructions (Addendum)
Adult Self-Care for Colds    Colds are caused by viruses. They can't be cured with antibiotics. However, you can ease symptoms and support your body's efforts to heal itself. No matter which symptoms you have, be sure to:   Drink plenty of fluids (water or clear soup)   Stop smoking and drinking alcohol   Get plenty of rest  Understand a fever   Take your temperature several times a day. If your fever is100.66F(38.0C) for more than a day, call your healthcare provider.   Relax, lie down. Go to bed if you want. Just get off your feet and rest. Also, drink plenty of fluids to avoid dehydration.   Take acetaminophen or a nonsteroidal anti-inflammatory agent (NSAID), such as ibuprofen.  Treat a troubled nose kindly   Breathe steam or heated humidified air to open blocked nasal passages. Stand in a hot shower or use a vaporizer. Be careful not to get burned by the steam.   Saline nasal sprays and decongestant tablets help open a stuffy nose. Antihistamines can also help, but they can cause side effects such as drowsiness and drying of the eyes, nose, and mouth.  Soothe a sore throat and cough   Gargle every2hours with1/4teaspoon of salt dissolved in1/2 cup of warm water. Suck on throat lozenges and cough drops to moisten your throat.   Cough medicines are available but it is unclear how well they actually work.   Take acetaminophen or an NSAID, such as ibuprofen, to ease throat pain  Ease digestive problems   Put fluids back into your body. Take frequent sips of clear liquids such as water or broth. Avoid drinks that have a lot of sugar in them, such as juices and sodas. These can make diarrhea worse. Older children and adults can drink sports drinks.   As your appetite returns, you can resume your normal diet. Ask your healthcare provider if there are any foods you should avoid.  When to seek medical care  When you first notice symptoms, ask your healthcare provider if antiviral medicines are  appropriate.Antibiotics should not be taken for colds or flu. Also, call your healthcare provider if you have any of the following symptoms or if you aren't feeling better after 7 days:   Shortness of breath   Pain or pressure in the chest or belly (abdomen)   Worsening symptoms, especially after a period of improvement   Fever of100.66F (38.0C) or higher, or fever that doesn't go down with medicine   Sudden dizziness or confusion   Severe or continued vomiting   Signs of dehydration, including extreme thirst, dark urine, infrequent urination, dry mouth   Spotted, red, or very sore throat   Date Last Reviewed: 03/06/2015   2000-2017 [employer]. [address] [place] [address]. All rights reserved. This information is not intended as a substitute for professional medical care. Always follow your healthcare professional's instructions.            Fluticasone Furoate Nasal spray  What is this medicine?  FLUTICASONE (floo TIK a sone) is a corticosteroid. This medicine is used to treat the symptoms of allergies like sneezing, itchy red eyes, and itchy, runny or stuffy nose.  How should I use this medicine?  This medicine is for use in the nose. Follow the directions on your prescription label. Shake well before using. Do not use more often than directed. Make sure that you are using your nasal spray correctly. Ask you doctor or  health care provider if you have any questions.  Talk to your pediatrician regarding the use of this medicine in children. While this drug may be prescribed for children as young as 91 years of age for selected conditions, precautions do apply.  What side effects may I notice from receiving this medicine?  Side effects that you should report to your doctor or health care professional as soon as possible:   allergic reactions like skin rash, itching or hives, swelling of the face, lips, or tongue   changes in vision   flu-like symptoms   nose bleeding,  sores   white patches or sores in the mouth or nose  Side effects that usually do not require medical attention (report to your doctor or health care professional if they continue or are bothersome):   burning or irritation inside the nose or throat   cough   headache   unusual taste or smell  What may interact with this medicine?   ketoconazole   metyrapone   some medicines for HIV   vaccines  What if I miss a dose?  If you miss a dose, use it as soon as you can. If it is almost time for your next dose, use only that dose. Do not use double or extra doses.  Where should I keep my medicine?  Keep out of the reach of children.  Store at room temperature between 15 and 30 degrees C (59 and 86 degrees F). Do not keep this medicine in the refrigerator or in the freezer. Throw away any unused medicine after the expiration date.  What should I tell my health care provider before I take this medicine?  They need to know if you have any of these conditions:   cataracts   glaucoma   infection, like tuberculosis, herpes, or fungal infection   recent surgery or injury of the nose or sinuses   taking corticosteroid by mouth   an unusual or allergic reaction to fluticasone, steroids, other medicines, foods, dyes, or preservatives   pregnant or trying to get pregnant   breast-feeding  What should I watch for while using this medicine?  Visit your doctor for regular check ups. Tell your doctor or healthcare professional if your symptoms do not start to get better or if they get worse.  This medicine may increase your risk of getting an infection. Tell your doctor or health care professional if you are around anyone with measles or chickenpox, or if you develop sores or blisters that do not heal properly.  NOTE:This sheet is a summary. It may not cover all possible information. If you have questions about this medicine, talk to your doctor, pharmacist, or health care provider. Copyright 2018 [employer]

## 2016-11-15 ENCOUNTER — Other Ambulatory Visit (INDEPENDENT_AMBULATORY_CARE_PROVIDER_SITE_OTHER): Payer: Self-pay | Admitting: Cardiovascular Disease

## 2016-11-15 DIAGNOSIS — N184 Chronic kidney disease, stage 4 (severe): Secondary | ICD-10-CM

## 2016-11-15 MED ORDER — SODIUM BICARBONATE 650 MG PO TABS
1300.0000 mg | ORAL_TABLET | Freq: Two times a day (BID) | ORAL | 2 refills | Status: DC
Start: 2016-11-15 — End: 2017-03-08

## 2016-11-15 NOTE — Telephone Encounter (Signed)
I called [name]'s daughter, [name], and spoke with her about the refill request for the sodium bicarbonate 650 mg/tablet, 2 tablets PO qAM.      Her father had this medication prescribed to him before he left the hospital and it is used to help treat metabolic acidosis secondary to his stage IV chronic kidney disease.    I asked [name] if her father had seen Dr. Tye Savoy, nephrologist, yet and she said, "no."    He has an appt with Dr. Tye Savoy on 11/30/2016.    I told [name] that I would fill the Sodium Bicarbonate and make sure that he has 90 days worth of the medication.  However, in the future, I asked her to have the nephrologist refill this medication because it may need to be adjusted according to her father's renal function.     [name] mentioned that the pharmacist told her that she would have to pay a $7.00 copay for this medication and wanted to know if it was important and if her father had to continue this medication. She said that the pharmacist told her that the sodium bicarbonate for for her father's "STOMACH."   I clarified that her father MUST take the sodium bicarbonate every day since it is being used to treat his chronic kidney disease.  She understood and said that she will make sure that her father receives this medication every day.         Jennette Banker, DO

## 2016-11-16 ENCOUNTER — Encounter (INDEPENDENT_AMBULATORY_CARE_PROVIDER_SITE_OTHER): Payer: Self-pay | Admitting: Cardiovascular Disease

## 2016-11-30 ENCOUNTER — Encounter (INDEPENDENT_AMBULATORY_CARE_PROVIDER_SITE_OTHER): Payer: Self-pay | Admitting: Cardiovascular Disease

## 2016-12-01 ENCOUNTER — Ambulatory Visit (INDEPENDENT_AMBULATORY_CARE_PROVIDER_SITE_OTHER): Payer: Medicare Other | Admitting: Cardiovascular Disease

## 2016-12-01 ENCOUNTER — Encounter (INDEPENDENT_AMBULATORY_CARE_PROVIDER_SITE_OTHER): Payer: Self-pay | Admitting: Cardiovascular Disease

## 2016-12-01 VITALS — BP 100/61 | HR 95 | Temp 98.2°F | Resp 16 | Ht 60.0 in | Wt 156.0 lb

## 2016-12-01 DIAGNOSIS — Z794 Long term (current) use of insulin: Secondary | ICD-10-CM

## 2016-12-01 DIAGNOSIS — I10 Essential (primary) hypertension: Secondary | ICD-10-CM

## 2016-12-01 DIAGNOSIS — E1122 Type 2 diabetes mellitus with diabetic chronic kidney disease: Secondary | ICD-10-CM

## 2016-12-01 DIAGNOSIS — I5032 Chronic diastolic (congestive) heart failure: Secondary | ICD-10-CM

## 2016-12-01 DIAGNOSIS — N189 Chronic kidney disease, unspecified: Secondary | ICD-10-CM

## 2016-12-01 DIAGNOSIS — M25562 Pain in left knee: Secondary | ICD-10-CM

## 2016-12-01 DIAGNOSIS — G8929 Other chronic pain: Secondary | ICD-10-CM

## 2016-12-01 DIAGNOSIS — E782 Mixed hyperlipidemia: Secondary | ICD-10-CM

## 2016-12-01 DIAGNOSIS — M25561 Pain in right knee: Secondary | ICD-10-CM

## 2016-12-01 LAB — POCT HEMOGLOBIN A1C: POCT Hgb A1C: 8.2 — AB (ref 3.9–5.9)

## 2016-12-01 MED ORDER — INSULIN GLARGINE 100 UNIT/ML SC SOLN
20.0000 [IU] | Freq: Every evening | SUBCUTANEOUS | 1 refills | Status: DC
Start: 2016-12-01 — End: 2016-12-08

## 2016-12-01 NOTE — Progress Notes (Addendum)
Subjective:      Date: 12/01/2016 5:34 PM   Patient ID: Marco Bruce is a 83 y.o. male.    Chief Complaint:  Chief Complaint   Patient presents with   . Diabetes   . Diastolic heart failure       HPI:  HPI     The patient is an 83 year old [place] man who presents for F/u of his diastolic heart failure.  His daughter, Marco Bruce, accompanies him and his helping with translation from [place] into [place].  He is feeling well, but his sugars have been elevated because he has been eating a lot of mashed potatoes.  His daughter, Marco Bruce, has been staying with her mother who has been trying to take care of her mother who is currently in physical therapy and demands that she stay with her while she is in the Rehab facility.,    Marco Bruce is feeling "okay" but does have chronic knee pain when he walks. I strongly encouraged him to continue to walk as much as possible otherwise his muscles will become too weak and this increases his risk of falling.     He is requesting a refill of his Lantus Solostar 100 units/mL SQ  q HS.   His AM glucose readings are typically around 90 to 100.     Problem List:  Patient Active Problem List   Diagnosis   . SOB (shortness of breath)   . ARF (acute renal failure)   . Pulmonary edema   . Acute pulmonary edema   . Acute renal failure, unspecified acute renal failure type   . CKD (chronic kidney disease), unspecified stage   . Acute diastolic heart failure   . Hospital discharge follow-up   . Peripheral edema   . Pneumonia due to infectious organism, unspecified laterality, unspecified part of lung   . Chronic diastolic heart failure   . CKD (chronic kidney disease) stage 4, GFR 15-29 ml/min   . Chronic pain syndrome   . Arthritis   . Uncontrolled type 2 diabetes with renal manifestation   . Renovascular hypertension   . NSTEMI (non-ST elevated myocardial infarction)   . CHF (congestive heart failure)   . Acute renal failure superimposed on stage 3 chronic kidney disease   . Hyponatremia   . Normocytic  anemia   . HTN (hypertension)   . Diabetes mellitus, type II   . OA (osteoarthritis)   . Acute on chronic diastolic congestive heart failure   . Fever and chills   . Weakness       Current Medications:  Current Outpatient Prescriptions   Medication Sig Dispense Refill   . allopurinol (ZYLOPRIM) 100 MG tablet Take 1 tablet (100 mg total) by mouth daily. 90 tablet 3   . atorvastatin (LIPITOR) 20 MG tablet Take 1 tablet (20 mg total) by mouth daily. 90 tablet 3   . chlorthalidone (HYGROTEN) 50 MG tablet TAKE ONE TABLET BY MOUTH EVERY DAY IN THE MORNING WITH FOOD 90 tablet 3   . cyclobenzaprine (FLEXERIL) 10 MG tablet Take 1 tablet (10 mg total) by mouth every 8 (eight) hours as needed for Muscle spasms. 30 tablet 5   . docusate sodium (COLACE) 100 MG capsule Take 100 mg by mouth 2 (two) times daily.     . fluticasone (FLONASE) 50 MCG/ACT nasal spray 1 spray by Nasal route 2 (two) times daily. 1 Bottle 0   . furosemide (LASIX) 20 MG tablet Take 2 tablets (40 mg total) by  mouth daily. 180 tablet 3   . gabapentin (NEURONTIN) 100 MG capsule TAKE ONE CAPSULE BY MOUTH EVERY 8 HOURS 90 capsule 5   . glucose blood (TRUE METRIX BLOOD GLUCOSE TEST) test strip 1 each by Other route 2 (two) times daily.Use as instructed 200 each PRN   . hydrALAZINE (APRESOLINE) 50 MG tablet Take 1 tablet (50 mg total) by mouth every morning. 90 tablet 3   . insulin glargine (LANTUS) 100 UNIT/ML injection Inject 20 Units into the skin nightly. 40 mL 1   . insulin lispro (HUMALOG) 100 UNIT/ML injection Inject 1-3 Units into the skin nightly as needed (High blood glucose. See administration instructions.).     Marland Kitchen insulin lispro (HUMALOG) 100 UNIT/ML injection Inject 1-5 Units into the skin 3 (three) times daily before meals as needed (High blood glucose. See administration instructions.).     . Insulin Pen Needle 31G X 5 MM Misc INJECT INSULIN DAILY AS DIRECTED 100 each 0   . lactobacillus/streptococcus (RISAQUAD) Cap Take 1 capsule by mouth daily. 30  capsule 0   . lidocaine (LIDODERM) 5 % Place 1 patch onto the skin daily.Remove & Discard patch within 12 hours or as directed by MD 15 patch 0   . Linagliptin 5 MG Tab Take 1 tablet (5 mg total) by mouth daily. 90 tablet 3   . metoprolol succinate XL (TOPROL-XL) 25 MG 24 hr tablet Take 1 tablet (25 mg total) by mouth daily. 90 tablet 3   . NIFEdipine (PROCARDIA XL) 60 MG 24 hr tablet TAKE 2 TABLETS BY MOUTH DAILY 90 tablet 3   . pantoprazole (PROTONIX) 40 MG tablet TAKE ONE TABLET BY MOUTH EVERY DAY 90 tablet 3   . predniSONE (DELTASONE) 5 MG tablet Take 1 tablet (5 mg total) by mouth daily. 90 tablet 1   . sodium bicarbonate 650 MG tablet Take 2 tablets (1,300 mg total) by mouth 2 (two) times daily. 120 tablet 2   . valsartan (DIOVAN) 80 MG tablet Take 1 tablet (80 mg total) by mouth daily. 90 tablet 3     No current facility-administered medications for this visit.        Allergies:  No Known Allergies    Past Medical History:  Past Medical History:   Diagnosis Date   . Congestive heart failure (CHF)    . Diabetes mellitus    . Gout     chronic   . Hypertension        Past Surgical History:  Past Surgical History:   Procedure Laterality Date   . ABDOMINAL SURGERY     . PACEMAKER Left 2017       Family History:  History reviewed. No pertinent family history.    Social History:  Social History     Social History   . Marital status: Married     Spouse name: N/A   . Number of children: N/A   . Years of education: N/A     Occupational History   . Not on file.     Social History Main Topics   . Smoking status: Never Smoker   . Smokeless tobacco: Never Used   . Alcohol use No      Comment: former alcoholic   . Drug use: No   . Sexual activity: Not on file     Other Topics Concern   . Not on file     Social History Narrative   . No narrative on file  The following sections were reviewed this encounter by the provider:   Allergies  Meds  Problems         Vitals:  BP 100/61 (BP Site: Right arm, Patient Position:  Sitting, Cuff Size: Large)   Pulse 95   Temp 98.2 F (36.8 C) (Oral)   Resp 16   Ht 1.524 m (5')   Wt 70.8 kg (156 lb)   SpO2 93%   BMI 30.47 kg/m        ROS:  Constitutional: Negative for fever, chills, weight loss/gain.    HENT: Negative for nasal congestion, nasal discharge.  Eyes: Negative for blurred vision, double vision, photophobia.  Respiratory: Negative for cough,  shortness of breath, wheezing, hemoptysis.    Cardiovascular: Negative for chest pain, palpitations, orthopnea, lower extremity edema.   Gastrointestinal: Negative for abdominal pain, nausea, vomiting, reflux, diarrhea, melena, hematochezia. Positive for occasional constipation.  Genitourinary: Negative for dysuria, urgency, frequency, hematuria.  Musculoskeletal: Negative for myalgias, back pain. Positive for chronic knee pain.   Skin: Negative for itching, rash, urticaria.   Neurological: Negative for dizziness, lightheadedness, headaches.           Objective:       Physical Exam:  Constitutional: Well-developed, well-nourished. In no acute distress.   Head: Atraumatic. Normocephalic.  Neck: Normal range of motion. Neck supple. No carotid bruits.  Cardiovascular: Normal rate.  Regular rhythm, no S3, normal S1, normal S2, no S4.  No murmur heard. No rubs, thrills, or lifts.   Pulmonary/Chest: Breath sounds normal. No wheezing, rhonchi, crackles, cough.   Abdominal: Bowel sounds are normal. Soft, non-tender, non-distended. No palpable masses. There is no rebound, rigidity, or guarding.   Musculoskeletal: Normal range of motion. Positive for chronic knee pain due to arthritis.   Neurological: Alert. Awake.  Oriented to person, place, and time.  He walks with a cane due to B/L knee pain.  Extremities: Normal distal pulses.  Grade + lower extremity edema.  No clubbing.  No cyanosis.  Skin: Skin is warm and moist. No exanthem.  Feet: No lesions or ulcerations of the skin or the nails of either foot. Normal vibration sense in both  feet.      Results for orders placed or performed in visit on 09/08/16   CBC w/o diff   Result Value Ref Range    WBC 5.83 3.50 - 10.80 x10 3/uL    Hgb 9.4 (L) 13.0 - 17.0 g/dL    Hematocrit 29.0 (L) 42.0 - 52.0 %    Platelets 240 140 - 400 x10 3/uL    RBC 3.10 (L) 4.70 - 6.00 x10 6/uL    MCV 93.5 80.0 - 100.0 fL    MCH 30.3 28.0 - 32.0 pg    MCHC 32.4 32.0 - 36.0 g/dL    RDW 16 (H) 12 - 15 %    MPV 9.9 9.4 - 12.3 fL    Nucleated RBC 0.0 0.0 - 1.0 /100 WBC    Absolute NRBC 0.00 0 x10 3/uL   Basic metabolic panel   Result Value Ref Range    Glucose 85 70 - 100 mg/dL    BUN 32.0 (H) 9.0 - 28.0 mg/dL    Creatinine 1.7 (H) 0.7 - 1.3 mg/dL    Calcium 9.1 7.9 - 10.2 mg/dL    Sodium 143 136 - 145 mEq/L    Potassium 4.0 3.5 - 5.1 mEq/L    Chloride 103 100 - 111 mEq/L    CO2 29 22 -  29 mEq/L    Anion Gap 11.0 5.0 - 15.0   GFR   Result Value Ref Range    EGFR 38.2         Assessment/Plan:       POCT HbA1c = 8.2%.  Mr. Geerts daughter, Marco Bruce, was advised to try and change some of her father's dietary choices so that he is consuming more vegetables and protein and less carbohydrates.  Apparently, he has been eating a lot of "mashed potatoes" since his family members have been unable to "cook" regular meals for him since Mr. Prien wife has been very sick and has been hospitalized for the past few months due to a complicated hip fracture and CKD that requires hemodialysis.  Mr. Taft wife insists that her family members sit and stay with her daily in the hospital and this means that Mr. Abercrombie is usually left alone at home with very few options for healthy meals.     1. Essential hypertension    2. Chronic diastolic heart failure    3. Type 2 diabetes mellitus with chronic kidney disease, with long-term current use of insulin, unspecified CKD stage  - insulin glargine (LANTUS) 100 UNIT/ML injection; Inject 20 Units into the skin nightly.  Dispense: 40 mL; Refill: 1  - POCT Hemoglobin A1C    4. Chronic pain of both knees    5. Chronic  kidney disease, unspecified CKD stage    6. Mixed hyperlipidemia      Return in about 3 months (around 03/04/2017) for 30 min appt: F/U type Ii DM, F/U diastolic heart failure.    Risk & Benefits of the new medication(s) were explained to the patient (and family) who verbalized understanding & agreed to the treatment plan. Patient (family) encouraged to contact me/clinical staff with any questions/concerns      Dorothe Pea Truman Hayward, DO

## 2016-12-03 ENCOUNTER — Telehealth (INDEPENDENT_AMBULATORY_CARE_PROVIDER_SITE_OTHER): Payer: Self-pay | Admitting: Cardiovascular Disease

## 2016-12-03 NOTE — Telephone Encounter (Signed)
Patient daughter called pt needs work note and and Ambulance person as dicussed in appointment.  Daughter was not able to elaborate any further.    Please advise

## 2016-12-03 NOTE — Telephone Encounter (Signed)
Patient needs letter asap for her job

## 2016-12-03 NOTE — Telephone Encounter (Signed)
Please call Ms. [name] [name] and notify  Her that I have written two letters on her behalf.    She can pick them up from the front desk - I left the envelope on Lauren's desk top.    The letters are in her father's chart - Mr. [name] - since she is NOT my patient.    Thank you.    Jennette Banker, DO

## 2016-12-04 NOTE — Telephone Encounter (Signed)
I called and spoke with Marco Bruce to let her know that the letters are ready for pick up in the [place] office.

## 2016-12-07 ENCOUNTER — Telehealth (INDEPENDENT_AMBULATORY_CARE_PROVIDER_SITE_OTHER): Payer: Self-pay | Admitting: Cardiovascular Disease

## 2016-12-07 NOTE — Telephone Encounter (Signed)
Pharmacist from [facility] called because patient was prescribed Lantus at the 12/01/16 office vist.  Pharmacist says the patient needs the medication listed below:    Lantus Solostar 100 units/mL  Box of 5 pens - 15 mL  Inject 20 units under the skin before bedtime.

## 2016-12-08 ENCOUNTER — Telehealth (INDEPENDENT_AMBULATORY_CARE_PROVIDER_SITE_OTHER): Payer: Self-pay | Admitting: Family Medicine

## 2016-12-08 DIAGNOSIS — Z794 Long term (current) use of insulin: Secondary | ICD-10-CM

## 2016-12-08 MED ORDER — LANTUS SOLOSTAR 100 UNIT/ML SC SOPN
20.0000 [IU] | PEN_INJECTOR | Freq: Every evening | SUBCUTANEOUS | 2 refills | Status: DC
Start: 2016-12-08 — End: 2017-02-17

## 2016-12-08 NOTE — Telephone Encounter (Signed)
FYI. Cancelled vial and sent pen refills

## 2016-12-08 NOTE — Telephone Encounter (Signed)
Pharmacist from [facility] called because patient was prescribed Lantus at the 12/01/16 office vist.  Pharmacist says the patient needs the medication listed below:    Lantus Solostar 100 units/mL  Box of 5 pens - 15 mL  Inject 20 units under the skin before bedtime.    Cancel vials and sent pen refill to pharmacy

## 2016-12-16 ENCOUNTER — Other Ambulatory Visit (INDEPENDENT_AMBULATORY_CARE_PROVIDER_SITE_OTHER): Payer: Self-pay | Admitting: Cardiovascular Disease

## 2016-12-16 DIAGNOSIS — M79605 Pain in left leg: Secondary | ICD-10-CM

## 2016-12-16 NOTE — Telephone Encounter (Signed)
LOV 12/01/16

## 2016-12-17 MED ORDER — CYCLOBENZAPRINE HCL 10 MG PO TABS
10.0000 mg | ORAL_TABLET | Freq: Three times a day (TID) | ORAL | 5 refills | Status: DC | PRN
Start: 2016-12-17 — End: 2017-12-01

## 2017-01-04 ENCOUNTER — Ambulatory Visit (INDEPENDENT_AMBULATORY_CARE_PROVIDER_SITE_OTHER): Payer: Medicare Other | Admitting: Registered Nurse

## 2017-01-04 ENCOUNTER — Encounter (INDEPENDENT_AMBULATORY_CARE_PROVIDER_SITE_OTHER): Payer: Self-pay | Admitting: Registered Nurse

## 2017-01-04 ENCOUNTER — Ambulatory Visit
Admission: RE | Admit: 2017-01-04 | Discharge: 2017-01-04 | Disposition: A | Discharge status: Home / Self Care | Payer: Medicare Other | Source: Ambulatory Visit | Attending: Registered Nurse | Admitting: Registered Nurse

## 2017-01-04 VITALS — BP 152/66 | HR 80 | Temp 97.0°F | Resp 16 | Ht 62.0 in | Wt 162.0 lb

## 2017-01-04 DIAGNOSIS — R05 Cough: Secondary | ICD-10-CM | POA: Insufficient documentation

## 2017-01-04 DIAGNOSIS — J069 Acute upper respiratory infection, unspecified: Secondary | ICD-10-CM | POA: Insufficient documentation

## 2017-01-04 DIAGNOSIS — R059 Cough, unspecified: Secondary | ICD-10-CM

## 2017-01-04 MED ORDER — BENZONATATE 200 MG PO CAPS
200.0000 mg | ORAL_CAPSULE | Freq: Three times a day (TID) | ORAL | 0 refills | Status: DC | PRN
Start: 2017-01-04 — End: 2017-03-28

## 2017-01-04 NOTE — Progress Notes (Signed)
No pneumonia. Please advise pt to F/U with PCP if his symptoms persistent. Thanks

## 2017-01-04 NOTE — Addendum Note (Signed)
Addended byRosalio Macadamia on: 01/04/2017 04:10 PM     Modules accepted: Orders

## 2017-01-04 NOTE — Progress Notes (Signed)
[facility]    PROGRESS NOTE      Patient: Marco Bruce   Date: 01/04/2017   MRN: [id number]     Past Medical History:   Diagnosis Date   . Congestive heart failure (CHF)    . Diabetes mellitus    . Gout     chronic   . Hypertension      Social History     Social History   . Marital status: Married     Spouse name: N/A   . Number of children: N/A   . Years of education: N/A     Occupational History   . Not on file.     Social History Main Topics   . Smoking status: Never Smoker   . Smokeless tobacco: Never Used   . Alcohol use No      Comment: former alcoholic   . Drug use: No   . Sexual activity: Not on file     Other Topics Concern   . Not on file     Social History Narrative   . No narrative on file     History reviewed. No pertinent family history.    ASSESSMENT/PLAN     Marco Bruce is a 83 y.o. male    Chief Complaint   Patient presents with   . Cough        1. Upper respiratory tract infection, unspecified type  - X-ray chest PA and lateral    2. Cough  - X-ray chest PA and lateral    Increasing fluid 8-10 cups , rest, Utilize throat lozenges, warm salt water gargles, and chloraseptic nasal spray. Vitamin C 1000mg  once  a day. Tylenol as needed for sore throat or bodyache. Zyrtec 10mg  once a day for running nose as needed. Mucinex OTC as needed for cough.      Results for orders placed or performed in visit on 12/01/16   POCT Hemoglobin A1C   Result Value Ref Range    POCT Hgb A1C 8.2 (A) 3.9 - 5.9       Risk & Benefits of the new medication(s) were explained to the patient (and family) who verbalized understanding & agreed to the treatment plan. Patient (family) encouraged to contact me/clinical staff with any questions/concerns      MEDICATIONS     Current Outpatient Prescriptions   Medication Sig Dispense Refill   . allopurinol (ZYLOPRIM) 100 MG tablet Take 1 tablet (100 mg total) by mouth daily. 90 tablet 3   . atorvastatin (LIPITOR) 20 MG tablet Take 1 tablet (20 mg total) by mouth daily. 90 tablet  3   . chlorthalidone (HYGROTEN) 50 MG tablet TAKE ONE TABLET BY MOUTH EVERY DAY IN THE MORNING WITH FOOD 90 tablet 3   . furosemide (LASIX) 20 MG tablet Take 2 tablets (40 mg total) by mouth daily. 180 tablet 3   . gabapentin (NEURONTIN) 100 MG capsule TAKE ONE CAPSULE BY MOUTH EVERY 8 HOURS 90 capsule 5   . glucose blood (TRUE METRIX BLOOD GLUCOSE TEST) test strip 1 each by Other route 2 (two) times daily.Use as instructed 200 each PRN   . hydrALAZINE (APRESOLINE) 50 MG tablet Take 1 tablet (50 mg total) by mouth every morning. 90 tablet 3   . insulin glargine (LANTUS SOLOSTAR) 100 UNIT/ML injection pen Inject 20 Units into the skin nightly. 5 pen 2   . insulin lispro (HUMALOG) 100 UNIT/ML injection Inject 1-3 Units into the skin nightly as needed (High  blood glucose. See administration instructions.).     . Insulin Pen Needle 31G X 5 MM Misc INJECT INSULIN DAILY AS DIRECTED 100 each 0   . lactobacillus/streptococcus (RISAQUAD) Cap Take 1 capsule by mouth daily. 30 capsule 0   . metoprolol succinate XL (TOPROL-XL) 25 MG 24 hr tablet Take 1 tablet (25 mg total) by mouth daily. 90 tablet 3   . NIFEdipine (PROCARDIA XL) 60 MG 24 hr tablet TAKE 2 TABLETS BY MOUTH DAILY 90 tablet 3   . pantoprazole (PROTONIX) 40 MG tablet TAKE ONE TABLET BY MOUTH EVERY DAY 90 tablet 3   . sodium bicarbonate 650 MG tablet Take 2 tablets (1,300 mg total) by mouth 2 (two) times daily. 120 tablet 2   . valsartan (DIOVAN) 80 MG tablet Take 1 tablet (80 mg total) by mouth daily. 90 tablet 3   . cyclobenzaprine (FLEXERIL) 10 MG tablet Take 1 tablet (10 mg total) by mouth every 8 (eight) hours as needed for Muscle spasms. 30 tablet 5   . fluticasone (FLONASE) 50 MCG/ACT nasal spray 1 spray by Nasal route 2 (two) times daily. 1 Bottle 0   . Linagliptin 5 MG Tab Take 1 tablet (5 mg total) by mouth daily. 90 tablet 3   . predniSONE (DELTASONE) 5 MG tablet Take 1 tablet (5 mg total) by mouth daily. 90 tablet 1     No current facility-administered  medications for this visit.        No Known Allergies    SUBJECTIVE     Chief Complaint   Patient presents with   . Cough        Cough   This is a new problem. The current episode started in the past 7 days (2-3 days ago). The problem has been gradually worsening. The problem occurs constantly. The cough is productive of sputum. Associated symptoms include postnasal drip and rhinorrhea. Pertinent negatives include no chest pain, chills, fever, headaches, rash, sore throat, shortness of breath or wheezing. Associated symptoms comments: sneezing. Nothing aggravates the symptoms. He has tried OTC cough suppressant (robitussin) for the symptoms. The treatment provided no relief. His past medical history is significant for bronchitis and pneumonia.       ROS     Review of Systems   Constitutional: Negative for appetite change, chills, diaphoresis, fatigue and fever.        Accompanied by a family member   HENT: Positive for congestion, postnasal drip, rhinorrhea and sneezing. Negative for sinus pressure, sore throat and trouble swallowing.    Respiratory: Positive for cough. Negative for chest tightness, shortness of breath and wheezing.    Cardiovascular: Negative for chest pain.   Gastrointestinal: Negative for abdominal distention, nausea and vomiting.   Skin: Negative for rash.   Neurological: Negative for dizziness and headaches.       The following sections were reviewed this encounter by the provider:   Tobacco  Allergies  Meds  Problems  Med Hx  Surg Hx  Fam Hx  Soc Hx          PHYSICAL EXAM     Vitals:    01/04/17 1022   BP: 152/66   BP Site: Right arm   Patient Position: Sitting   Cuff Size: Medium   Pulse: 80   Resp: 16   Temp: 97 F (36.1 C)   TempSrc: Tympanic   SpO2: 94%   Weight: 73.5 kg (162 lb)   Height: 1.575 m (5\' 2" )  Physical Exam   Constitutional: He is oriented to person, place, and time. He appears well-developed and well-nourished. No distress.   HENT:   Head: Normocephalic.   Right  Ear: Tympanic membrane, external ear and ear canal normal.   Left Ear: Tympanic membrane, external ear and ear canal normal.   Nose: Nose normal.   Mouth/Throat: Uvula is midline and mucous membranes are normal. No oropharyngeal exudate, posterior oropharyngeal edema or posterior oropharyngeal erythema.   Eyes: Pupils are equal, round, and reactive to light. Conjunctivae and EOM are normal.   Neck: Normal range of motion. Neck supple.   Cardiovascular: Normal rate, regular rhythm and normal heart sounds.    No murmur heard.  Pulmonary/Chest: Effort normal. No respiratory distress. He has decreased breath sounds in the right lower field and the left lower field. He has no wheezes. He has no rhonchi. He has no rales.   Neurological: He is alert and oriented to person, place, and time.   Nursing note and vitals reviewed.    Ortho Exam  Neurologic Exam     Mental Status   Oriented to person, place, and time.     Cranial Nerves     CN III, IV, VI   Pupils are equal, round, and reactive to light.  Extraocular motions are normal.       PROCEDURE(S)     Procedures        Signed,  Rosalio Macadamia, FNP  01/04/2017

## 2017-01-05 ENCOUNTER — Telehealth (INDEPENDENT_AMBULATORY_CARE_PROVIDER_SITE_OTHER): Payer: Self-pay | Admitting: Family

## 2017-01-05 NOTE — Telephone Encounter (Signed)
Nurse was advised to let pt know that he can take delsym BID to help with cough symptoms.

## 2017-01-05 NOTE — Telephone Encounter (Signed)
Patients daughter called to request change for cough medication. Reports the medication was too expensive.

## 2017-01-11 ENCOUNTER — Ambulatory Visit (INDEPENDENT_AMBULATORY_CARE_PROVIDER_SITE_OTHER): Payer: Self-pay | Admitting: Cardiovascular Disease

## 2017-01-11 LAB — VAHRT HISTORIC LVEF: Ejection Fraction: 40 %

## 2017-02-16 ENCOUNTER — Ambulatory Visit (INDEPENDENT_AMBULATORY_CARE_PROVIDER_SITE_OTHER): Payer: Self-pay | Admitting: Cardiovascular Disease

## 2017-02-17 ENCOUNTER — Other Ambulatory Visit (INDEPENDENT_AMBULATORY_CARE_PROVIDER_SITE_OTHER): Payer: Self-pay | Admitting: Cardiovascular Disease

## 2017-02-17 DIAGNOSIS — E1122 Type 2 diabetes mellitus with diabetic chronic kidney disease: Secondary | ICD-10-CM

## 2017-02-18 IMAGING — DX DG ANKLE COMPLETE 3+V*L*
3 series · 3 of 3 positions shown · non-contrast
Comparison: None.

CLINICAL DATA: Left ankle pain for 1 day. No known injury. Right
ankle is for comparison purposes.

EXAM:
LEFT ANKLE COMPLETE - 3+ VIEW; RIGHT ANKLE - COMPLETE 3+ VIEW

[x ankle ap left]
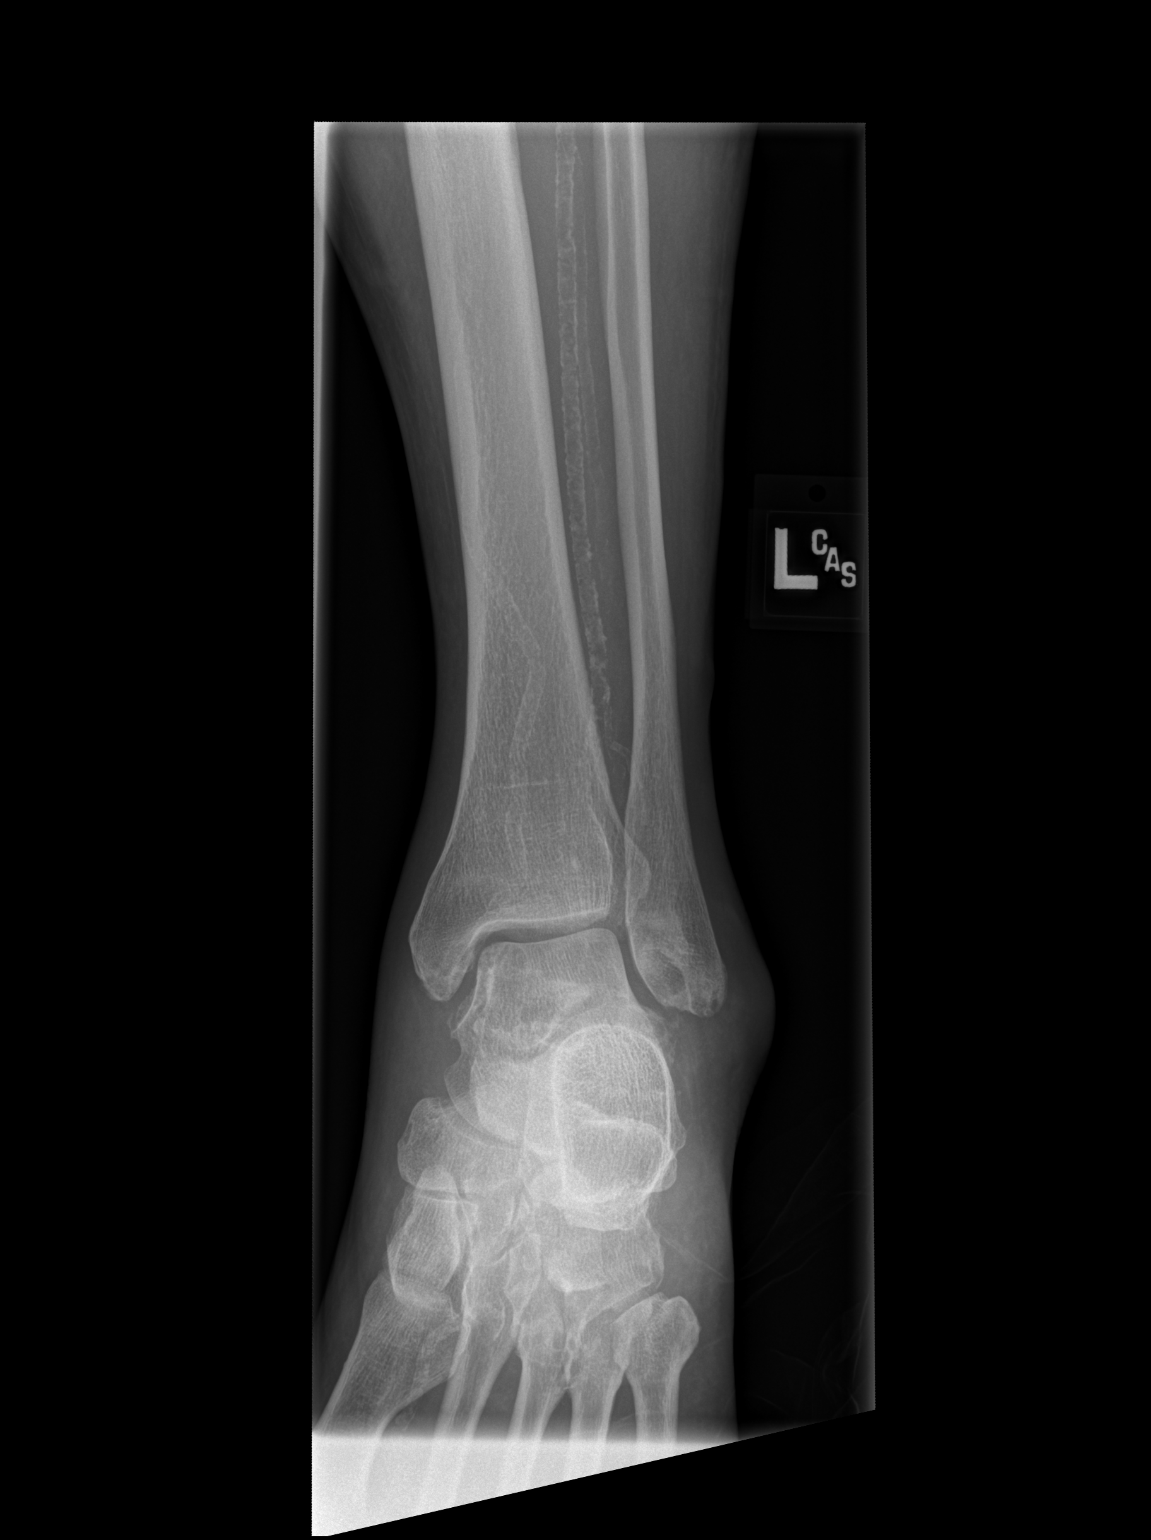

[x ankle obl left]
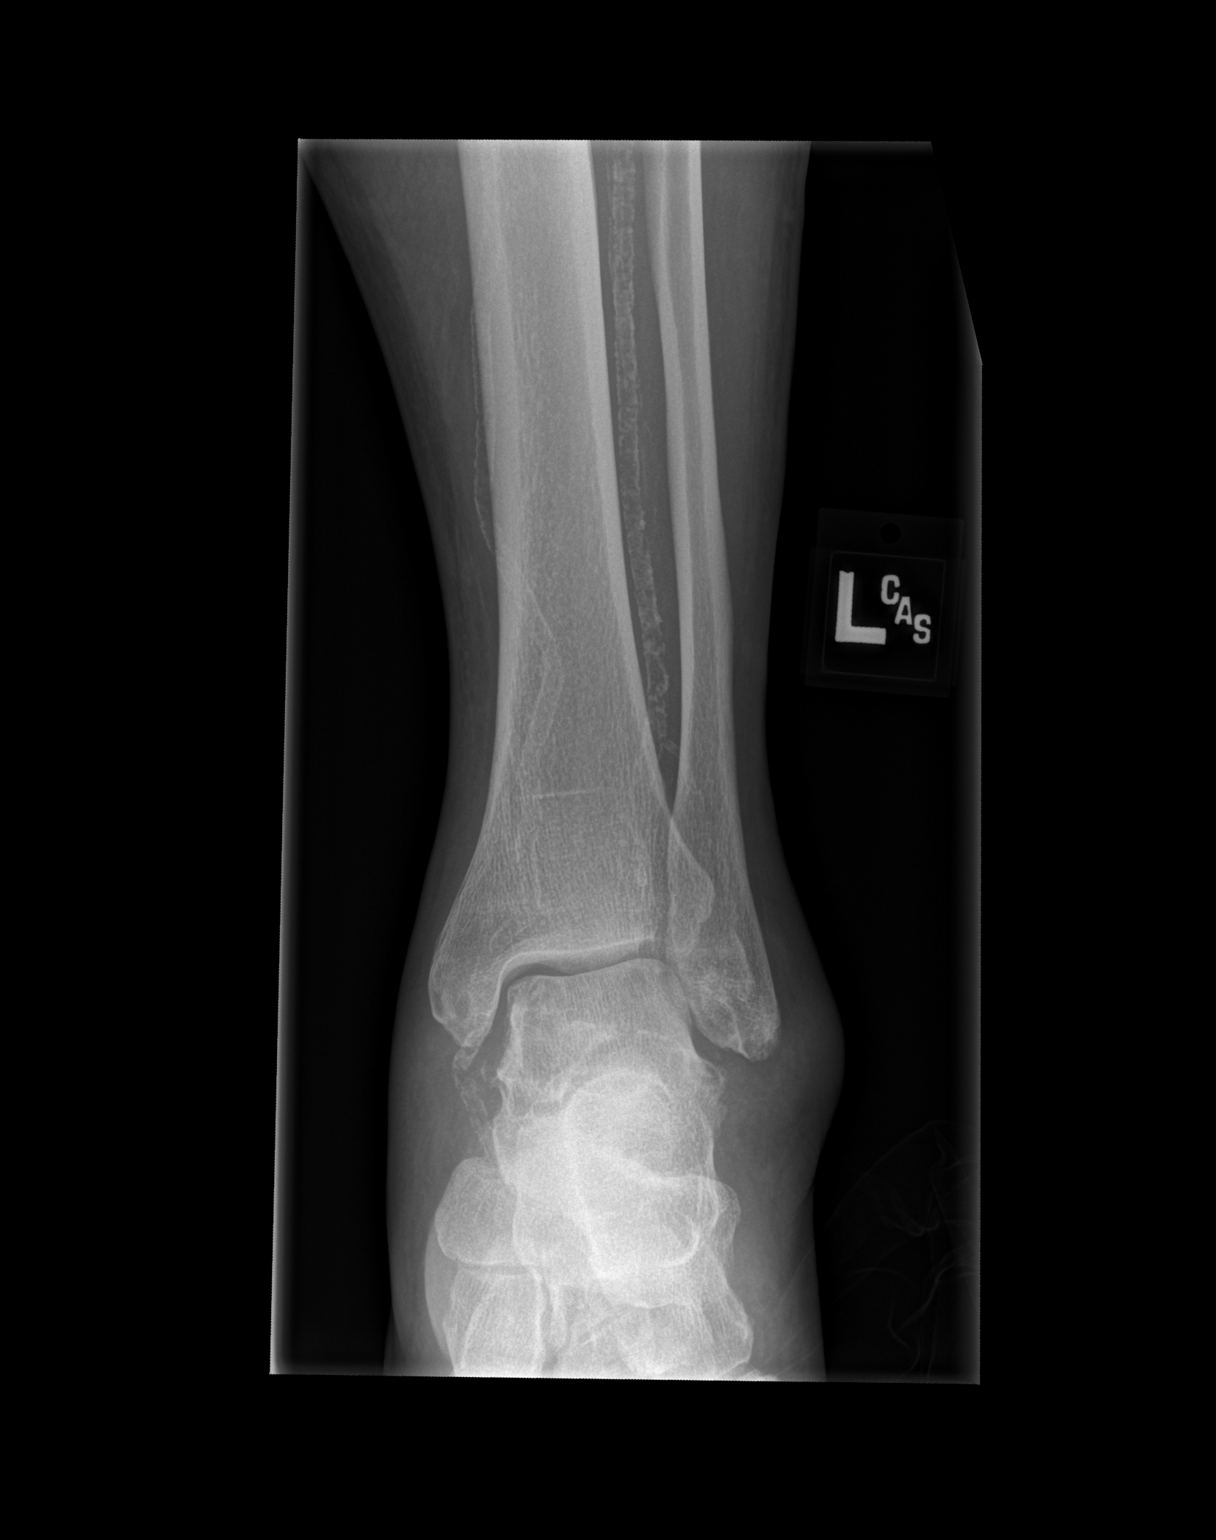

[x ankle lat left]
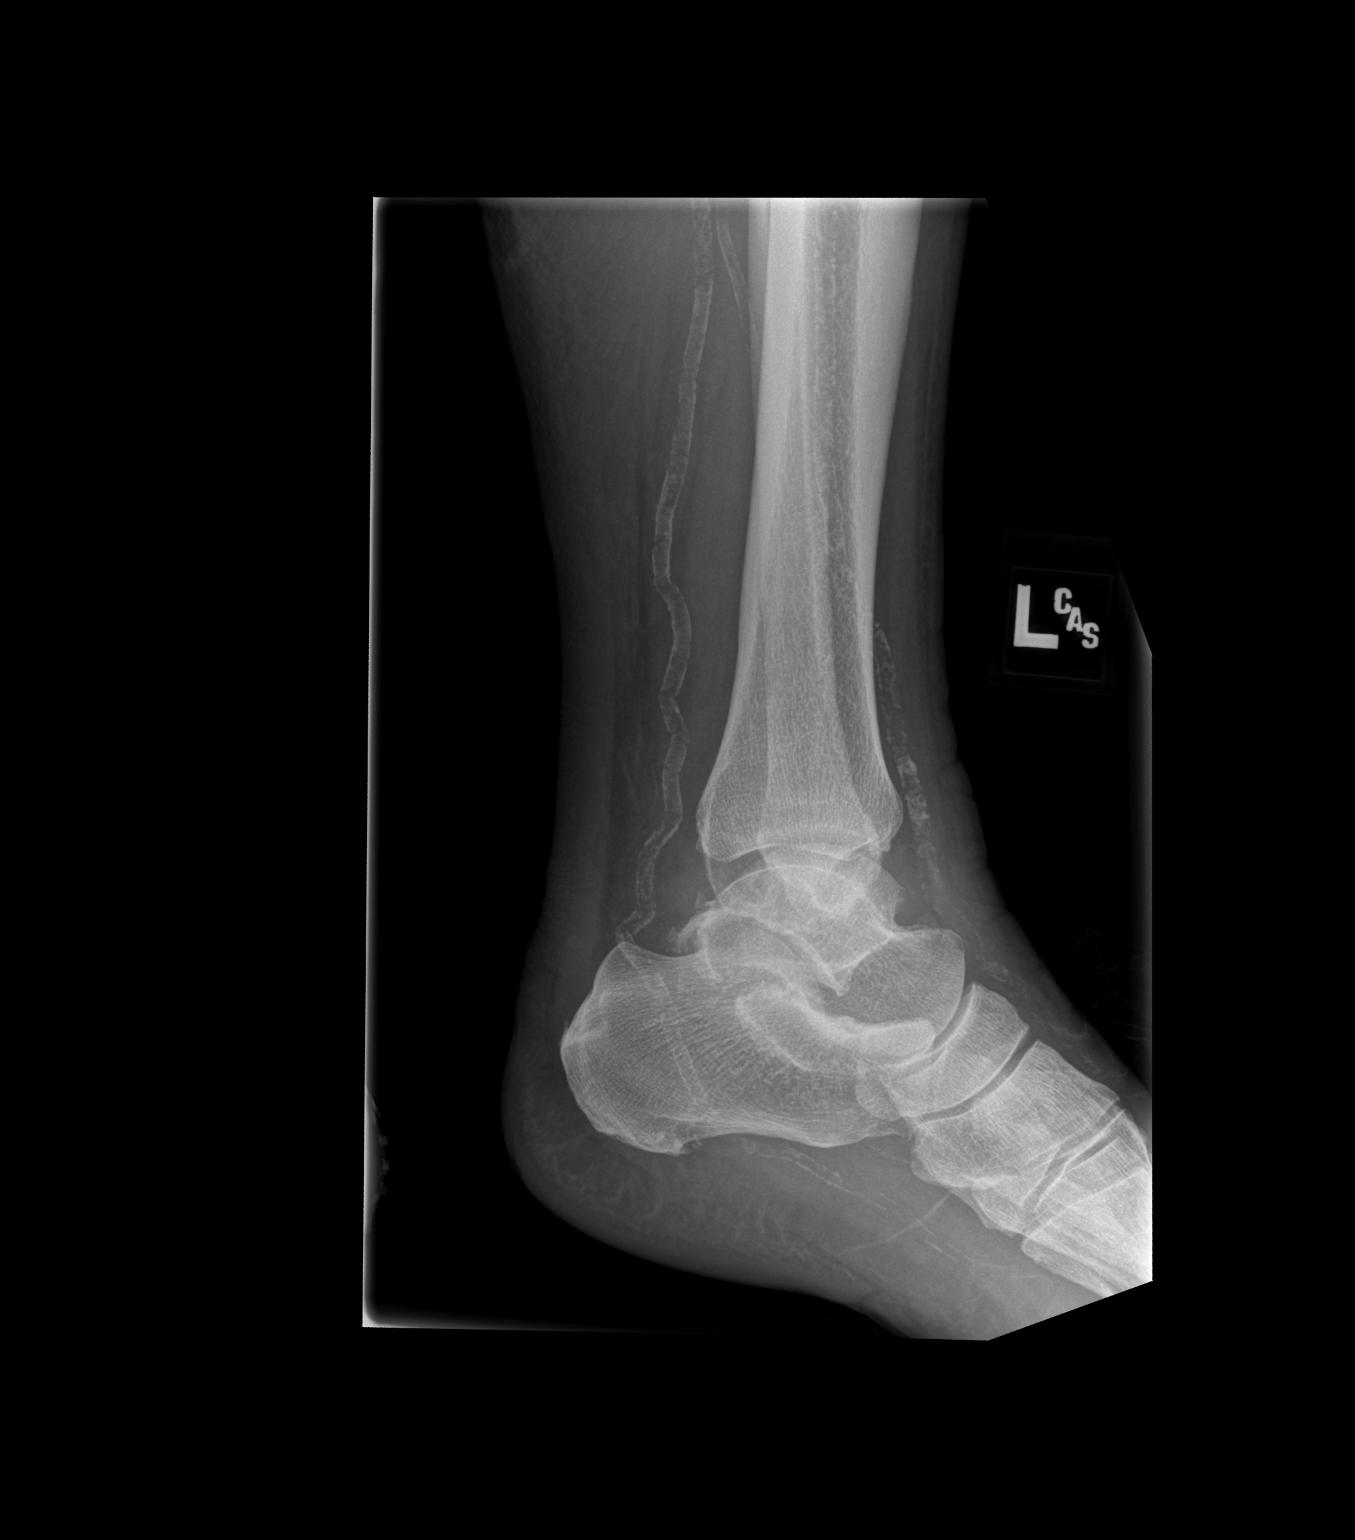

[3 of 3 positions shown; findings below may reference images not displayed]

FINDINGS: Left ankle:

Moderate degenerative changes involving the ankle joint with
subchondral cystic change and spurring. No acute fracture or
osteochondral lesion. Subtalar joint degenerative changes are also
noted. No mid or hindfoot fracture. Extensive vascular
calcifications.

Right ankle:

Bony detail somewhat obscured by the patient's socks.

Moderate degenerative changes similar to the left ankle. No acute
fracture. Small calcaneal spurs are noted. Extensive vascular
calcifications.
IMPRESSION: Bilateral ankle joint degenerative changes but no acute bony
findings.

Extensive vascular calcifications.

## 2017-02-18 MED ORDER — LANTUS SOLOSTAR 100 UNIT/ML SC SOPN
20.0000 [IU] | PEN_INJECTOR | Freq: Every evening | SUBCUTANEOUS | 3 refills | Status: DC
Start: 2017-02-18 — End: 2017-08-04

## 2017-03-04 ENCOUNTER — Ambulatory Visit (INDEPENDENT_AMBULATORY_CARE_PROVIDER_SITE_OTHER): Payer: Medicare Other | Admitting: Cardiovascular Disease

## 2017-03-08 ENCOUNTER — Other Ambulatory Visit (INDEPENDENT_AMBULATORY_CARE_PROVIDER_SITE_OTHER): Payer: Self-pay | Admitting: Cardiovascular Disease

## 2017-03-08 DIAGNOSIS — N184 Chronic kidney disease, stage 4 (severe): Secondary | ICD-10-CM

## 2017-03-09 ENCOUNTER — Telehealth (INDEPENDENT_AMBULATORY_CARE_PROVIDER_SITE_OTHER): Payer: Self-pay | Admitting: Cardiovascular Disease

## 2017-03-09 NOTE — Telephone Encounter (Signed)
Please call [name] daughter, Marco Bruce, and inform her that I have sent in a refill for her father's sodium bicarbonate 650 mg/tablet, 2 tablets by mouth twice a day,  #120 tablets with 2 refills (this is a total of 3 months of medication).    HOWEVER, her father MUST see a nephrologist (kidney specialist) for management of his chronic kidney disease and the kidney specialist will need to prescribe this medication for her father in the future.    I will not refill this medication for [name] in the future - the kidney specialist must refill this medication.    She needs to take her father to the kidney specialist that I referred him to - Dr. Kate Sable.      Thank you.    Jennette Banker, DO

## 2017-03-11 ENCOUNTER — Ambulatory Visit (INDEPENDENT_AMBULATORY_CARE_PROVIDER_SITE_OTHER): Payer: Medicare Other | Admitting: Cardiovascular Disease

## 2017-03-11 NOTE — Telephone Encounter (Signed)
LMTCB

## 2017-03-16 NOTE — Telephone Encounter (Signed)
Patient's daughter LM stating she was calling back about her dad's medication.  She would like a call back on [phone].  I called back and got her voice mail and advised that Dr Truman Hayward refilled sodium Bicarb but this needs to be refilled by nephrologist, Dr Esaw Dace.

## 2017-03-18 ENCOUNTER — Emergency Department: Payer: Medicare Other

## 2017-03-18 ENCOUNTER — Inpatient Hospital Stay
Admission: EM | Admit: 2017-03-18 | Discharge: 2017-03-20 | DRG: 202 | Disposition: A | Discharge status: Home / Self Care | Payer: Medicare Other | Attending: Internal Medicine | Admitting: Internal Medicine

## 2017-03-18 DIAGNOSIS — N179 Acute kidney failure, unspecified: Secondary | ICD-10-CM | POA: Diagnosis present

## 2017-03-18 DIAGNOSIS — E1140 Type 2 diabetes mellitus with diabetic neuropathy, unspecified: Secondary | ICD-10-CM

## 2017-03-18 DIAGNOSIS — I10 Essential (primary) hypertension: Secondary | ICD-10-CM

## 2017-03-18 DIAGNOSIS — I150 Renovascular hypertension: Secondary | ICD-10-CM | POA: Diagnosis present

## 2017-03-18 DIAGNOSIS — E1122 Type 2 diabetes mellitus with diabetic chronic kidney disease: Secondary | ICD-10-CM | POA: Diagnosis present

## 2017-03-18 DIAGNOSIS — I5032 Chronic diastolic (congestive) heart failure: Secondary | ICD-10-CM | POA: Diagnosis present

## 2017-03-18 DIAGNOSIS — E785 Hyperlipidemia, unspecified: Secondary | ICD-10-CM

## 2017-03-18 DIAGNOSIS — I509 Heart failure, unspecified: Secondary | ICD-10-CM

## 2017-03-18 DIAGNOSIS — I252 Old myocardial infarction: Secondary | ICD-10-CM

## 2017-03-18 DIAGNOSIS — R509 Fever, unspecified: Secondary | ICD-10-CM

## 2017-03-18 DIAGNOSIS — I340 Nonrheumatic mitral (valve) insufficiency: Secondary | ICD-10-CM

## 2017-03-18 DIAGNOSIS — R05 Cough: Secondary | ICD-10-CM

## 2017-03-18 DIAGNOSIS — E876 Hypokalemia: Secondary | ICD-10-CM | POA: Diagnosis present

## 2017-03-18 DIAGNOSIS — N3000 Acute cystitis without hematuria: Secondary | ICD-10-CM | POA: Diagnosis present

## 2017-03-18 DIAGNOSIS — R059 Cough, unspecified: Secondary | ICD-10-CM

## 2017-03-18 DIAGNOSIS — E782 Mixed hyperlipidemia: Secondary | ICD-10-CM | POA: Diagnosis present

## 2017-03-18 DIAGNOSIS — IMO0001 Reserved for inherently not codable concepts without codable children: Secondary | ICD-10-CM

## 2017-03-18 DIAGNOSIS — I130 Hypertensive heart and chronic kidney disease with heart failure and stage 1 through stage 4 chronic kidney disease, or unspecified chronic kidney disease: Secondary | ICD-10-CM | POA: Diagnosis present

## 2017-03-18 DIAGNOSIS — M109 Gout, unspecified: Secondary | ICD-10-CM | POA: Diagnosis present

## 2017-03-18 DIAGNOSIS — E871 Hypo-osmolality and hyponatremia: Secondary | ICD-10-CM

## 2017-03-18 DIAGNOSIS — M1990 Unspecified osteoarthritis, unspecified site: Secondary | ICD-10-CM | POA: Diagnosis present

## 2017-03-18 DIAGNOSIS — Z7952 Long term (current) use of systemic steroids: Secondary | ICD-10-CM

## 2017-03-18 DIAGNOSIS — M353 Polymyalgia rheumatica: Secondary | ICD-10-CM | POA: Diagnosis present

## 2017-03-18 DIAGNOSIS — E869 Volume depletion, unspecified: Secondary | ICD-10-CM | POA: Diagnosis present

## 2017-03-18 DIAGNOSIS — J40 Bronchitis, not specified as acute or chronic: Principal | ICD-10-CM | POA: Diagnosis present

## 2017-03-18 DIAGNOSIS — K5900 Constipation, unspecified: Secondary | ICD-10-CM | POA: Diagnosis present

## 2017-03-18 DIAGNOSIS — N184 Chronic kidney disease, stage 4 (severe): Secondary | ICD-10-CM | POA: Diagnosis present

## 2017-03-18 DIAGNOSIS — N390 Urinary tract infection, site not specified: Secondary | ICD-10-CM | POA: Diagnosis present

## 2017-03-18 DIAGNOSIS — D649 Anemia, unspecified: Secondary | ICD-10-CM | POA: Diagnosis present

## 2017-03-18 DIAGNOSIS — Z794 Long term (current) use of insulin: Secondary | ICD-10-CM

## 2017-03-18 LAB — COMPREHENSIVE METABOLIC PANEL
ALT: 12 U/L (ref 0–55)
AST (SGOT): 25 U/L (ref 5–34)
Albumin/Globulin Ratio: 1.4 (ref 0.9–2.2)
Albumin: 3.8 g/dL (ref 3.5–5.0)
Alkaline Phosphatase: 70 U/L (ref 38–106)
Anion Gap: 10 (ref 5.0–15.0)
BUN: 38.1 mg/dL — ABNORMAL HIGH (ref 9.0–28.0)
Bilirubin, Total: 0.4 mg/dL (ref 0.2–1.2)
CO2: 31 mEq/L — ABNORMAL HIGH (ref 22–29)
Calcium: 9.9 mg/dL (ref 7.9–10.2)
Chloride: 86 mEq/L — ABNORMAL LOW (ref 100–111)
Creatinine: 2.3 mg/dL — ABNORMAL HIGH (ref 0.7–1.3)
Globulin: 2.8 g/dL (ref 2.0–3.6)
Glucose: 207 mg/dL — ABNORMAL HIGH (ref 70–100)
Potassium: 3.4 mEq/L — ABNORMAL LOW (ref 3.5–5.1)
Protein, Total: 6.6 g/dL (ref 6.0–8.3)
Sodium: 127 mEq/L — ABNORMAL LOW (ref 136–145)

## 2017-03-18 LAB — CBC AND DIFFERENTIAL
Absolute NRBC: 0 10*3/uL
Basophils Absolute Automated: 0.03 10*3/uL (ref 0.00–0.20)
Basophils Automated: 0.4 %
Eosinophils Absolute Automated: 1.55 10*3/uL — ABNORMAL HIGH (ref 0.00–0.70)
Eosinophils Automated: 23 %
Hematocrit: 38.5 % — ABNORMAL LOW (ref 42.0–52.0)
Hgb: 13.3 g/dL (ref 13.0–17.0)
Immature Granulocytes Absolute: 0.03 10*3/uL
Immature Granulocytes: 0.4 %
Lymphocytes Absolute Automated: 1.85 10*3/uL (ref 0.50–4.40)
Lymphocytes Automated: 27.4 %
MCH: 31.6 pg (ref 28.0–32.0)
MCHC: 34.5 g/dL (ref 32.0–36.0)
MCV: 91.4 fL (ref 80.0–100.0)
MPV: 10.5 fL (ref 9.4–12.3)
Monocytes Absolute Automated: 0.57 10*3/uL (ref 0.00–1.20)
Monocytes: 8.5 %
Neutrophils Absolute: 2.71 10*3/uL (ref 1.80–8.10)
Neutrophils: 40.3 %
Nucleated RBC: 0 /100 WBC (ref 0.0–1.0)
Platelets: 143 10*3/uL (ref 140–400)
RBC: 4.21 10*6/uL — ABNORMAL LOW (ref 4.70–6.00)
RDW: 12 % (ref 12–15)
WBC: 6.74 10*3/uL (ref 3.50–10.80)

## 2017-03-18 LAB — GFR: EGFR: 26.9

## 2017-03-18 LAB — URINALYSIS, REFLEX TO MICROSCOPIC EXAM IF INDICATED
Bilirubin, UA: NEGATIVE
Blood, UA: NEGATIVE
Glucose, UA: 50 — AB
Ketones UA: NEGATIVE
Nitrite, UA: NEGATIVE
Protein, UR: 100 — AB
Specific Gravity UA: 1.013 (ref 1.001–1.035)
Urine pH: 6 (ref 5.0–8.0)
Urobilinogen, UA: NORMAL mg/dL

## 2017-03-18 LAB — TROPONIN I: Troponin I: 0.07 ng/mL (ref 0.00–0.09)

## 2017-03-18 LAB — OSMOLALITY: Osmolality: 281 mosm/kg (ref 280–300)

## 2017-03-18 LAB — B-TYPE NATRIURETIC PEPTIDE: B-Natriuretic Peptide: 46.3 pg/mL (ref 0.0–100.0)

## 2017-03-18 LAB — GLUCOSE WHOLE BLOOD - POCT: Whole Blood Glucose POCT: 172 mg/dL — ABNORMAL HIGH (ref 70–100)

## 2017-03-18 MED ORDER — NALOXONE HCL 0.4 MG/ML IJ SOLN (WRAP)
0.20 mg | INTRAMUSCULAR | Status: DC | PRN
Start: 2017-03-18 — End: 2017-03-20

## 2017-03-18 MED ORDER — DOCUSATE SODIUM 100 MG PO CAPS
100.00 mg | ORAL_CAPSULE | Freq: Every day | ORAL | Status: DC
Start: 2017-03-18 — End: 2017-03-20
  Administered 2017-03-18 – 2017-03-20 (×3): 100 mg via ORAL
  Filled 2017-03-18 (×3): qty 1

## 2017-03-18 MED ORDER — CYCLOBENZAPRINE HCL 10 MG PO TABS
10.00 mg | ORAL_TABLET | Freq: Three times a day (TID) | ORAL | Status: DC | PRN
Start: 2017-03-18 — End: 2017-03-20

## 2017-03-18 MED ORDER — ONDANSETRON HCL 4 MG/2ML IJ SOLN
4.00 mg | Freq: Four times a day (QID) | INTRAMUSCULAR | Status: DC | PRN
Start: 2017-03-18 — End: 2017-03-20

## 2017-03-18 MED ORDER — GUAIFENESIN-CODEINE 100-10 MG/5ML PO SYRP
5.00 mL | ORAL_SOLUTION | ORAL | Status: DC | PRN
Start: 2017-03-18 — End: 2017-03-20

## 2017-03-18 MED ORDER — SODIUM CHLORIDE 0.9 % IV MBP
1.00 g | INTRAVENOUS | Status: DC
Start: 2017-03-18 — End: 2017-03-20
  Administered 2017-03-18 – 2017-03-19 (×2): 1 g via INTRAVENOUS
  Filled 2017-03-18: qty 1000
  Filled 2017-03-18: qty 100
  Filled 2017-03-18 (×2): qty 1000

## 2017-03-18 MED ORDER — NIFEDIPINE ER OSMOTIC RELEASE 30 MG PO TB24
60.00 mg | ORAL_TABLET | Freq: Every day | ORAL | Status: DC
Start: 2017-03-19 — End: 2017-03-20
  Administered 2017-03-19 – 2017-03-20 (×2): 60 mg via ORAL
  Filled 2017-03-18 (×2): qty 2

## 2017-03-18 MED ORDER — ACETAMINOPHEN 325 MG PO TABS
650.00 mg | ORAL_TABLET | ORAL | Status: DC | PRN
Start: 2017-03-18 — End: 2017-03-20

## 2017-03-18 MED ORDER — GABAPENTIN 100 MG PO CAPS
100.00 mg | ORAL_CAPSULE | Freq: Three times a day (TID) | ORAL | Status: DC
Start: 2017-03-18 — End: 2017-03-20
  Administered 2017-03-18 – 2017-03-20 (×4): 100 mg via ORAL
  Filled 2017-03-18 (×5): qty 1

## 2017-03-18 MED ORDER — PREDNISONE 5 MG PO TABS
5.00 mg | ORAL_TABLET | ORAL | Status: DC
Start: 2017-03-19 — End: 2017-03-20
  Administered 2017-03-19 – 2017-03-20 (×2): 5 mg via ORAL
  Filled 2017-03-18 (×2): qty 1

## 2017-03-18 MED ORDER — PANTOPRAZOLE SODIUM 40 MG PO TBEC
40.00 mg | DELAYED_RELEASE_TABLET | Freq: Every morning | ORAL | Status: DC
Start: 2017-03-19 — End: 2017-03-20
  Administered 2017-03-19 – 2017-03-20 (×2): 40 mg via ORAL
  Filled 2017-03-18 (×2): qty 1

## 2017-03-18 MED ORDER — DEXTROSE 50 % IV SOLN
12.50 g | INTRAVENOUS | Status: DC | PRN
Start: 2017-03-18 — End: 2017-03-20

## 2017-03-18 MED ORDER — VALSARTAN 80 MG PO TABS
80.00 mg | ORAL_TABLET | Freq: Every day | ORAL | Status: DC
Start: 2017-03-19 — End: 2017-03-20
  Administered 2017-03-19 – 2017-03-20 (×2): 80 mg via ORAL
  Filled 2017-03-18 (×2): qty 1

## 2017-03-18 MED ORDER — INSULIN LISPRO 100 UNIT/ML SC SOLN
1.00 [IU] | Freq: Three times a day (TID) | SUBCUTANEOUS | Status: DC | PRN
Start: 2017-03-18 — End: 2017-03-20
  Administered 2017-03-19 (×2): 2 [IU] via SUBCUTANEOUS
  Filled 2017-03-18 (×2): qty 6

## 2017-03-18 MED ORDER — POTASSIUM CHLORIDE CRYS ER 20 MEQ PO TBCR
20.00 meq | EXTENDED_RELEASE_TABLET | Freq: Once | ORAL | Status: AC
Start: 2017-03-18 — End: 2017-03-18
  Administered 2017-03-18: 23:00:00 20 meq via ORAL
  Filled 2017-03-18: qty 1

## 2017-03-18 MED ORDER — GLUCAGON 1 MG IJ SOLR (WRAP)
1.00 mg | INTRAMUSCULAR | Status: DC | PRN
Start: 2017-03-18 — End: 2017-03-20

## 2017-03-18 MED ORDER — GLUCOSE 40 % PO GEL
15.00 g | ORAL | Status: DC | PRN
Start: 2017-03-18 — End: 2017-03-20

## 2017-03-18 MED ORDER — RISAQUAD PO CAPS
1.00 | ORAL_CAPSULE | Freq: Every day | ORAL | Status: DC
Start: 2017-03-19 — End: 2017-03-20
  Administered 2017-03-19 – 2017-03-20 (×2): 1 via ORAL
  Filled 2017-03-18 (×3): qty 1

## 2017-03-18 MED ORDER — FLUTICASONE PROPIONATE 50 MCG/ACT NA SUSP
1.00 | Freq: Two times a day (BID) | NASAL | Status: DC
Start: 2017-03-18 — End: 2017-03-20
  Administered 2017-03-18 – 2017-03-19 (×3): 1 via NASAL
  Filled 2017-03-18: qty 16

## 2017-03-18 MED ORDER — SODIUM CHLORIDE 0.9 % IV SOLN
INTRAVENOUS | Status: AC
Start: 2017-03-18 — End: 2017-03-19

## 2017-03-18 MED ORDER — SITAGLIPTIN PHOSPHATE 50 MG PO TABS
25.00 mg | ORAL_TABLET | Freq: Every day | ORAL | Status: DC
Start: 2017-03-19 — End: 2017-03-20
  Administered 2017-03-19 – 2017-03-20 (×2): 25 mg via ORAL
  Filled 2017-03-18 (×2): qty 1

## 2017-03-18 MED ORDER — ENOXAPARIN SODIUM 30 MG/0.3ML SC SOLN
30.00 mg | Freq: Every day | SUBCUTANEOUS | Status: DC
Start: 2017-03-19 — End: 2017-03-20
  Administered 2017-03-19 – 2017-03-20 (×2): 30 mg via SUBCUTANEOUS
  Filled 2017-03-18 (×2): qty 0.3

## 2017-03-18 MED ORDER — BENZONATATE 100 MG PO CAPS
200.00 mg | ORAL_CAPSULE | Freq: Three times a day (TID) | ORAL | Status: DC | PRN
Start: 2017-03-18 — End: 2017-03-20
  Administered 2017-03-18 – 2017-03-19 (×2): 200 mg via ORAL
  Filled 2017-03-18 (×2): qty 2

## 2017-03-18 MED ORDER — SODIUM CHLORIDE 0.9 % IV BOLUS
1000.00 mL | Freq: Once | INTRAVENOUS | Status: AC
Start: 2017-03-18 — End: 2017-03-18
  Administered 2017-03-18: 19:00:00 1000 mL via INTRAVENOUS

## 2017-03-18 MED ORDER — INSULIN GLARGINE 100 UNIT/ML SC SOLN
10.00 [IU] | Freq: Every evening | SUBCUTANEOUS | Status: DC
Start: 2017-03-18 — End: 2017-03-20
  Administered 2017-03-18 – 2017-03-19 (×2): 10 [IU] via SUBCUTANEOUS
  Filled 2017-03-18 (×2): qty 10

## 2017-03-18 MED ORDER — ATORVASTATIN CALCIUM 20 MG PO TABS
20.00 mg | ORAL_TABLET | Freq: Every day | ORAL | Status: DC
Start: 2017-03-19 — End: 2017-03-20
  Administered 2017-03-19 – 2017-03-20 (×2): 20 mg via ORAL
  Filled 2017-03-18 (×2): qty 1

## 2017-03-18 MED ORDER — METOPROLOL SUCCINATE ER 25 MG PO TB24
25.00 mg | ORAL_TABLET | Freq: Every day | ORAL | Status: DC
Start: 2017-03-19 — End: 2017-03-20
  Administered 2017-03-19 – 2017-03-20 (×2): 25 mg via ORAL
  Filled 2017-03-18 (×2): qty 1

## 2017-03-18 MED ORDER — ALLOPURINOL 100 MG PO TABS
100.00 mg | ORAL_TABLET | Freq: Every day | ORAL | Status: DC
Start: 2017-03-19 — End: 2017-03-20
  Administered 2017-03-19 – 2017-03-20 (×2): 100 mg via ORAL
  Filled 2017-03-18 (×2): qty 1

## 2017-03-18 MED ORDER — SODIUM BICARBONATE 650 MG PO TABS
1300.00 mg | ORAL_TABLET | Freq: Two times a day (BID) | ORAL | Status: DC
Start: 2017-03-18 — End: 2017-03-20
  Administered 2017-03-18 – 2017-03-20 (×4): 1300 mg via ORAL
  Filled 2017-03-18 (×4): qty 2

## 2017-03-18 MED ORDER — INSULIN LISPRO 100 UNIT/ML SC SOLN
1.00 [IU] | Freq: Every evening | SUBCUTANEOUS | Status: DC | PRN
Start: 2017-03-18 — End: 2017-03-20
  Administered 2017-03-18: 23:00:00 1 [IU] via SUBCUTANEOUS
  Administered 2017-03-19: 21:00:00 2 [IU] via SUBCUTANEOUS
  Filled 2017-03-18: qty 3
  Filled 2017-03-18: qty 6

## 2017-03-18 MED ORDER — HYDRALAZINE HCL 50 MG PO TABS
50.00 mg | ORAL_TABLET | Freq: Every morning | ORAL | Status: DC
Start: 2017-03-19 — End: 2017-03-20
  Administered 2017-03-19 – 2017-03-20 (×2): 50 mg via ORAL
  Filled 2017-03-18 (×2): qty 1

## 2017-03-18 NOTE — Plan of Care (Signed)
Problem: Safety  Goal: Patient will be free from injury during hospitalization  Outcome: Progressing   03/18/17 2344   Goal/Interventions addressed this shift   Patient will be free from injury during hospitalization  Assess patient's risk for falls and implement fall prevention plan of care per policy;Provide and maintain safe environment;Use appropriate transfer methods;Ensure appropriate safety devices are available at the bedside;Hourly rounding;Include patient/ family/ care giver in decisions related to safety     Goal: Patient will be free from infection during hospitalization  Outcome: Progressing   03/18/17 2344   Goal/Interventions addressed this shift   Free from Infection during hospitalization Assess and monitor for signs and symptoms of infection;Monitor lab/diagnostic results;Monitor all insertion sites (i.e. indwelling lines, tubes, urinary catheters, and drains)       Problem: Pain  Goal: Pain at adequate level as identified by patient  Outcome: Progressing   03/18/17 2344   Goal/Interventions addressed this shift   Pain at adequate level as identified by patient Evaluate if patient comfort function goal is met;Evaluate patient's satisfaction with pain management progress;Identify patient comfort function goal;Assess pain on admission, during daily assessment and/or before any "as needed" intervention(s);Reassess pain within 30-60 minutes of any procedure/intervention, per Pain Assessment, Intervention, Reassessment (AIR) Cycle

## 2017-03-18 NOTE — ED Provider Notes (Signed)
EMERGENCY DEPARTMENT HISTORY AND PHYSICAL EXAM    Date Time: 03/18/17 5:53 PM  Patient Name: Marco Bruce  Attending Physician: Nestor Ramp, MD      Clinical course in ED:   Final diagnoses:  Final diagnoses:   Hyponatremia   Acute renal failure, unspecified acute renal failure type     ED Disposition:   ED Disposition     ED Disposition Condition Date/Time Comment    Admit  Fri Mar 18, 2017  7:38 PM Admitting Physician: Hoy Finlay [[id number]]   Diagnosis: Hyponatremia [696789]   Estimated Length of Stay: 3 - 5 Days   Tentative Discharge Plan?: Home or Self Care [1]   Patient Class: Inpatient [101]          Clinical course in ED:  83 y.o. male generalized weakness, acute renal failure and hyponatremia.  We will admit for hydration      Medications given in ED:  ED Medication Orders     Start Ordered     Status Ordering Provider    03/18/17 1853 03/18/17 1852  sodium chloride 0.9 % bolus 1,000 mL  Once     Route: Intravenous  Ordered Dose: 1,000 mL     Last MAR action:  New Bag Nestor Ramp        History of Presenting Illness:     Chief Complaint: Generalized weakness  History obtained from: Patient.  Onset/Duration: 5 days  Quality: Cough, vomiting, weakness  Severity: Moderate  Aggravating Factors: None  Alleviating Factors: None  Associated Symptoms: As below  Narrative/Additional Historical Findings: 83 y.o. male history of congestive heart failure, diabetes, hypertension presents with generalized weakness, cough, fatigue, vomiting for the last 5-7 days.  No documented fever, no diarrhea , melena, urinary symptoms.  No chest pain other than soreness from coughing        I have reviewed all prior records including any ED visits, office visits, inpatient admissions, imaging and EKGs    Past Medical History:     Past Medical History:   Diagnosis Date   . Congestive heart failure (CHF)    . Diabetes mellitus    . Gout     chronic   . Hypertension        Past Surgical History:     Past Surgical  History:   Procedure Laterality Date   . ABDOMINAL SURGERY     . PACEMAKER Left 2017       Family History:     History reviewed. No pertinent family history.    Social History:     Social History     Social History   . Marital status: Married     Spouse name: N/A   . Number of children: N/A   . Years of education: N/A     Social History Main Topics   . Smoking status: Never Smoker   . Smokeless tobacco: Never Used   . Alcohol use No      Comment: former alcoholic   . Drug use: No   . Sexual activity: Not on file     Other Topics Concern   . Not on file     Social History Narrative   . No narrative on file       Allergies:     No Known Allergies    Medications:       (Not in a hospital admission)    Review of Systems:     Pertinent Positives and Negatives noted in the  HPI.  All Other Systems Reviewed and Negative: Yes    Physical Exam:     Vitals:    03/18/17 1830   BP: 160/71   Pulse: 60   Resp: 18   Temp: 98.5 F (36.9 C)   SpO2: 98%     Constitutional: Vital signs reviewed.   Head: Normocephalic, atraumatic  Eyes: No conjunctival injection. No discharge.  ENT: Mucous membranes moist  Neck: Normal range of motion. Non-tender.  Respiratory/Chest: Rhonchi left base. No respiratory distress.   Cardiovascular: Regular rate and rhythm. No murmur.   Abdomen: Soft and non-tender. No guarding. No masses or hepatosplenomegaly.  Back: Nrml appearance, no flank ttp, no midline ttp or masses.   LowerExtremity: No edema. No cyanosis.  Neurological: No focal motor deficits by observation. Speech normal. Memory normal.  Msk: Nrml ROM, non-ttp  Skin: Warm and dry. No rash.  Lymphatic: No cervical lymphadenopathy.  Psychiatric: Normal affect. Normal concentration.    Labs:     Results     Procedure Component Value Units Date/Time    B-type Natriuretic Peptide [[id number]] Collected:  03/18/17 1812    Specimen:  Blood Updated:  03/18/17 1912     B-Natriuretic Peptide 46.3 pg/mL     Rapid influenza A/B antigens [[id number]] Collected:   03/18/17 1811    Specimen:  Nasopharyngeal from Nasal Aspirate Updated:  03/18/17 1904    Narrative:       ORDER#: [id number]                                    ORDERED BY: Verl Blalock  SOURCE: Nasal Aspirate                               COLLECTED:  03/18/17 18:11  ANTIBIOTICS AT COLL.:                                RECEIVED :  03/18/17 18:17  Influenza Rapid Antigen A&B                FINAL       03/18/17 19:04  03/18/17   Negative for Influenza A and B             Reference Range: Negative      CBC with differential [[id number]]  (Abnormal) Collected:  03/18/17 1812    Specimen:  Blood from Blood Updated:  03/18/17 1857     WBC 6.74 x10 3/uL      Hgb 13.3 g/dL      Hematocrit 38.5 (L) %      Platelets 143 x10 3/uL      RBC 4.21 (L) x10 6/uL      MCV 91.4 fL      MCH 31.6 pg      MCHC 34.5 g/dL      RDW 12 %      MPV 10.5 fL      Neutrophils 40.3 %      Lymphocytes Automated 27.4 %      Monocytes 8.5 %      Eosinophils Automated 23.0 %      Basophils Automated 0.4 %      Immature Granulocyte 0.4 %      Nucleated RBC 0.0 /100 WBC      Neutrophils Absolute 2.71  x10 3/uL      Abs Lymph Automated 1.85 x10 3/uL      Abs Mono Automated 0.57 x10 3/uL      Abs Eos Automated 1.55 (H) x10 3/uL      Absolute Baso Automated 0.03 x10 3/uL      Absolute Immature Granulocyte 0.03 x10 3/uL      Absolute NRBC 0.00 x10 3/uL     GFR [[id number]] Collected:  03/18/17 1812     Updated:  03/18/17 1846     EGFR 26.9    Comprehensive metabolic panel [[id number]]  (Abnormal) Collected:  03/18/17 1812    Specimen:  Blood Updated:  03/18/17 1846     Glucose 207 (H) mg/dL      BUN 38.1 (H) mg/dL      Creatinine 2.3 (H) mg/dL      Sodium 127 (L) mEq/L      Potassium 3.4 (L) mEq/L      Chloride 86 (L) mEq/L      CO2 31 (H) mEq/L      Calcium 9.9 mg/dL      Protein, Total 6.6 g/dL      Albumin 3.8 g/dL      AST (SGOT) 25 U/L      ALT 12 U/L      Alkaline Phosphatase 70 U/L      Bilirubin, Total 0.4 mg/dL      Globulin 2.8 g/dL       Albumin/Globulin Ratio 1.4     Anion Gap 10.0    Troponin I [[id number]] Collected:  03/18/17 1812    Specimen:  Blood Updated:  03/18/17 1846     Troponin I 0.07 ng/mL     UA with reflex to micro (pts  3 + yrs) [[id number]]  (Abnormal) Collected:  03/18/17 1810    Specimen:  Urine Updated:  03/18/17 1843     Urine Type Clean Catch     Color, UA Yellow     Clarity, UA Clear     Specific Gravity UA 1.013     Urine pH 6.0     Leukocyte Esterase, UA Trace (A)     Nitrite, UA Negative     Protein, UR 100 (A)     Glucose, UA 50 (A)     Ketones UA Negative     Urobilinogen, UA Normal mg/dL      Bilirubin, UA Negative     Blood, UA Negative     RBC, UA 0-2 /hpf      WBC, UA 11-25 (A) /hpf      Squamous Epithelial Cells, Urine 0-5 /hpf             Rads:     XR Chest  AP Portable   Final Result     No acute cardiopulmonary disease.       Virl Diamond, MD    03/18/2017 6:26 PM          Medical Decision Making:   I reviewed the vital signs, nursing notes, past medical history, past surgical history, family history and social history.  Vital Signs -   Patient Vitals for the past 12 hrs:   BP Temp Pulse Resp   03/18/17 1830 160/71 98.5 F (36.9 C) 60 18   03/18/17 1752 - - 60 18       Events in ED:      Procedures:  Procedures    Interpretations:    Differential Diagnosis (not completely inclusive): Influenza, viral syndrome,  pneumonia, congestive heart failure    EKG was Reviewed, Analyzed and Interpreted by me: Yes: Atrially paced rate 60, left axis deviation, nonspecific intraventricular conduction delay, no acute ST or T-wave changes    Rhythm Strip Interpretation / Cardiac Monitor Analysis: Yes: Paced rate 60    Pulse Ox Analysis: Yes: saturation: 97 %; Oxygen use: room air; Interpretation: Normal      Critical Care Time: No    Amount and/or Complexity of Data Reviewed  Clinical lab tests: ordered and reviewed  Tests in the radiology section of CPT: ordered and reviewed  Tests in the medicine section of CPT: reviewed  and ordered  Decide to obtain previous medical records or to obtain history from someone other than the patient: yes  Obtain history from someone other than the patient: yes  Review and summarize past medical records: yes  Independent visualization of images, tracings, or specimens: yes  Patient Progress  Patient progress: improved            _______________________________    Signed by: Nestor Ramp, MD       Nestor Ramp, MD  03/18/17 1940

## 2017-03-18 NOTE — H&P (Signed)
[name]  H&P    Patient Info:   Date/Time: 03/18/2017 / 10:52 PM   Admit Date:03/18/2017  Patient Name:Marco Bruce   VWU:[id number]   PCP: Marco Sicilian June, DO  Attending Physician:Vallabhaneni, Zacarias Pontes,*     Assessment and Plan:     1. Hyponatremia with volume depletion and urinary tract infection with kidney function is slightly higher than baseline chronic kidney stage 4 - creatinine is elevated 2.3 ( Baseline in Epic is 2 ) with elevated BUN   Start Rocephin, probiotic, follow urine culture   Consult Dr. Kate Sable in the morning   Serum osmolality, random urine sodium, osmolality   NS at 75 cc, 1 L bolus given in the Er   Pt has been non-compliant with sodium bicarb- educated importance of this medication, pt wife states she thought this  was a "vitamin"  And she was not given this to him because it upsets stomach therefore did not want to take it, pt  educated about importance of taking this medication.    Monitor for spiking temps   Flu is negative   Follow labs    2. Non-productive cough, could be viral in nature, no fever or while count   Robitussin, Flonase,and tessalon   Chest-xray negative, no fever or chills- repeat 2 view CXR in AM    3. Hypokalemia- 3.4- repleated   Continue to trend with daily labs    4. Hypertension- Normotensive on admission   Continue home Metropolol, nifedipine, valsartan    Vs per unit protocol    5. Diastolic congestive heart failure, normal BNP, not in acute excerebration. Pt has severe valve disease with severe mitral regurgitation and tricuspid regurgitation, pt with  A-paced rhythm on EKG   Holding, lasix, hygroten now given kidney injury   Chest x-ray is clear, no leg edema   Last echo was 08/2016- EF 40%    6. Diabetes type 2- Insulin dependent: BS 200's on admission. complicated with peripheral neuropathy (pt is on steroids)   Last A1c was good- 8.2 ( 12/01/16)   Lantus- prescribing 10 Units instead 20 given poor appetite   Sliding Scale  Insulin, check BC AC/HS   Continue Linagliptin   Continue gabapentin    7. Hx of Gout- continue allopurinol, stable.    8. Hx of HLD- continue Lipitor, stable    9. Polymyalgia rheumatica stable chronic, continue steroids daily    10.  Constipation- colace daily    Hospital Problems:  Principal Problem:    Hyponatremia  Active Problems:    CHF (congestive heart failure)    Acute renal failure superimposed on stage 3 chronic kidney disease    HTN (hypertension)    Diabetes mellitus, type II    OA (osteoarthritis)    Fever and chills    Weakness    UTI (urinary tract infection)    Acute kidney injury superimposed on chronic kidney disease    Hypokalemia    Polymyalgia rheumatica    Constipation    Cough    DVT Prohylaxis:lovenox   Code Status: Full Code  Disposition:home  Type of Admission:Inpatient  Estimated Length of Stay (including stay in the ER receiving treatment): greater than 2 midnights  Foley and Central Lines: none  Medical Necessity for stay: UTI and hyponatremia  Milestones: see above    Clinical Presentation:   History of Presenting Illness:     Past Surgical History:   Procedure Laterality Date   . ABDOMINAL SURGERY     .  PACEMAKER Left 2017      Past Medical History:   Diagnosis Date   . CKD (chronic kidney disease) stage 4, GFR 15-29 ml/min    . Congestive heart failure (CHF)     EF 40% 5/18 echo, on lasix   . Diabetes mellitus     a1c was 8.2 on 12/01/2016   . Gout     chronic   . HLD (hyperlipidemia)    . Hypertension    . Polymyalgia rheumatica     chronic steroids     Pt requested to have wife translate at bedside, refused translation phone.     This is a 88- year [place] born male, full code with a hx significant for with diabetes mellitus, gout, chronic kidney disease stage IV, hypertension, diastolic congestive heart failure and severe valve disease with severe mitral regurgitation and tricuspid regurgitation who comes into hospital today with complaints of worsening fatigue, weakness,  feeling tired, dry cough for 5 days.  Pt was last admitted from 5/17- 5/21 for SIRS,acute on chronic kidney disease, right renal atrophy.  Pt states cough is non-productive. Cough is intermittent and associated with headache and chest tightness and intermittent episodes of dry heaving, no vomiting. Pt took Tylenol PM and Robitussin for his symptoms with little improvement.  Wife states pt has not been eating much, has been drinking a few glasses of water per day, eating bites of foods and drinking ensure. Pt has been more lethargic.Pt has been resting/ sleeping more. Pt has been refusing to taking sodium bicarb. Wife stated she thought this was optional because it was "vitamin". Prior notes indicated pt has bene educated of this many times. I reinforced the importance of this medication for the patients kidney and patient verbalized understanding. Denies any urinary symptoms.    Denies fever, chills,chest pain, palpitations, syncope,dizziness, lightheadedness, diaphoresis, vision changes,vomiting, abdominal pain, blood in stool, diarrhea, urinary symptoms, numbness or focal weakness or recent travels.    Review of Systems:   A comprehensive review of systems was negative except for the underlying highlighted in Bold     Constitutional: chills, weight loss, malaise/fatigue and diaphoresis.   HENT: hearing loss, ear pain, nosebleeds, congestion, sore throat, neck pain, tinnitus and ear discharge.   Eyes: blurred vision, double vision, photophobia, pain, discharge and redness.   Respiratory: dry cough, hemoptysis, sputum production, shortness of breath, wheezing and stridor.   Cardiovascular: chest pain, palpitations, orthopnea, claudication, leg swelling and PND.   Gastrointestinal: heartburn, nausea, vomiting, abdominal pain, diarrhea, constipation, blood in stool and melena.   Genitourinary: dysuria, urgency, frequency, hematuria and flank pain.   Musculoskeletal: myalgias, back pain, joint pain and falls.   Skin:  itching and rash.   Neurological: dizziness, tingling, tremors, sensory change, speech change, focal weakness, seizures, loss of consciousness, weakness and headaches.   Endo/Heme/Allergies: environmental allergies and polydipsia. Does not bruise/bleed easily.   Psychiatric/Behavioral: depression, suicidal ideas, hallucinations, memory loss and substance abuse. The patient is not nervous/anxious and does not have insomnia.     Physical Exam:     Vitals:    03/18/17 1752 03/18/17 1830 03/18/17 2100 03/18/17 2236   BP:  160/71 147/64 144/78   Pulse: 60 60 90 67   Resp: 18 18 18 16    Temp:  98.5 F (36.9 C)  97.3 F (36.3 C)   TempSrc:  Temporal Artery     SpO2: 97% 98% 98% 98%   Weight: 69.9 kg (154 lb)      Height:  1.575 m (5\' 2" )          Constitutional: Patient is oriented to person, place, and time. Patient appears elderly and well-nourished.   Head: Normocephalic and atraumatic.  Eyes- pupils equal and reactive, extraocular eye movements intact, sclera anicteric  Ears - external ear canals normal, right ear normal, left ear normal  Nose - normal and patent, no erythema, discharge or polyps and normal nontender sinuses  Mouth - mucous membranes dry, pharynx normal without lesions  Neck: Normal range of motion. Neck supple. No JVD present. No bruit or thrill noted on bilateral carotids. No tracheal deviation present. No thyromegaly present.   Cardiovascular: Normal rate, regular rhythm, normal heart sounds and intact distal pulses. Exam reveals no gallop and no friction rub. No murmur heard.  Pulmonary/Chest: Effort normal and breath sounds normal. No stridor. No respiratory distress. Patient has no wheezes. No crackles were present. Exhibits no tenderness on palpation. Dry cough- non productive, occasional   Abdominal: Soft. Bowel sounds are normal. Patient exhibits no distension and no mass was palpable. There is no tenderness. There is no rebound and no guarding.   Musculoskeletal: Normal range of motion.  Patient exhibits no edema and no tenderness.   Lymphadenopathy: Patient has no cervical adenopathy.   Neurological: Patient is alert and oriented to person, place, and time and has normal reflexes. No cranial nerve deficit. Normal muscle tone. Coordination normal. Weakness- generalized, no focal neurological deficits.   Skin: Skin is warm. No rash noted. Patient is not diaphoretic. No erythema. No pallor. Healed midline abdominal scar  Psychiatric: Has normal mood and affect. Behavior is normal. Judgment and thought content normal    Physical Exam  Clinical Information and History:   Chief Complaint:  Chief Complaint   Patient presents with   . Generalized weakness   . Cough   . Nausea     Past Medical History:  Past Medical History:   Diagnosis Date   . CKD (chronic kidney disease) stage 4, GFR 15-29 ml/min    . Congestive heart failure (CHF)     EF 40% 5/18 echo, on lasix   . Diabetes mellitus     a1c was 8.2 on 12/01/2016   . Gout     chronic   . HLD (hyperlipidemia)    . Hypertension    . Polymyalgia rheumatica     chronic steroids     Past Surgical History:  Past Surgical History:   Procedure Laterality Date   . ABDOMINAL SURGERY     . PACEMAKER Left 2017     Family History:History reviewed. No pertinent family history.  Social History:  History   Alcohol Use No     Comment: former alcoholic     History   Drug Use No     History   Smoking Status   . Never Smoker   Smokeless Tobacco   . Never Used     Social History     Social History   . Marital status: Married     Spouse name: N/A   . Number of children: N/A   . Years of education: N/A     Social History Main Topics   . Smoking status: Never Smoker   . Smokeless tobacco: Never Used   . Alcohol use No      Comment: former alcoholic   . Drug use: No   . Sexual activity: Not Asked     Other Topics Concern   . None  Social History Narrative   . None     Allergies:No Known Allergies  Medications:  Prescriptions Prior to Admission   Medication Sig Dispense  Refill Last Dose   . allopurinol (ZYLOPRIM) 100 MG tablet Take 1 tablet (100 mg total) by mouth daily. 90 tablet 3 03/18/2017 at Unknown time   . atorvastatin (LIPITOR) 20 MG tablet Take 1 tablet (20 mg total) by mouth daily. 90 tablet 3 03/18/2017 at Unknown time   . chlorthalidone (HYGROTEN) 50 MG tablet TAKE ONE TABLET BY MOUTH EVERY DAY IN THE MORNING WITH FOOD 90 tablet 3 03/18/2017 at Unknown time   . furosemide (LASIX) 20 MG tablet Take 2 tablets (40 mg total) by mouth daily. 180 tablet 3 03/18/2017 at Unknown time   . hydrALAZINE (APRESOLINE) 50 MG tablet Take 1 tablet (50 mg total) by mouth every morning. 90 tablet 3 03/18/2017 at Unknown time   . NIFEdipine (PROCARDIA XL) 60 MG 24 hr tablet TAKE 2 TABLETS BY MOUTH DAILY 90 tablet 3 03/18/2017 at Unknown time   . pantoprazole (PROTONIX) 40 MG tablet TAKE ONE TABLET BY MOUTH EVERY DAY 90 tablet 3 03/18/2017 at Unknown time   . valsartan (DIOVAN) 80 MG tablet Take 1 tablet (80 mg total) by mouth daily. 90 tablet 3 03/18/2017 at Unknown time   . benzonatate (TESSALON) 200 MG capsule Take 1 capsule (200 mg total) by mouth 3 (three) times daily as needed for Cough. 30 capsule 0 Unknown at Unknown time   . cyclobenzaprine (FLEXERIL) 10 MG tablet Take 1 tablet (10 mg total) by mouth every 8 (eight) hours as needed for Muscle spasms. 30 tablet 5 Unknown at Unknown time   . fluticasone (FLONASE) 50 MCG/ACT nasal spray 1 spray by Nasal route 2 (two) times daily. 1 Bottle 0 Unknown at Unknown time   . gabapentin (NEURONTIN) 100 MG capsule TAKE ONE CAPSULE BY MOUTH EVERY 8 HOURS 90 capsule 5 Unknown at Unknown time   . glucose blood (TRUE METRIX BLOOD GLUCOSE TEST) test strip 1 each by Other route 2 (two) times daily.Use as instructed 200 each PRN Unknown at Unknown time   . insulin glargine (LANTUS SOLOSTAR) 100 UNIT/ML injection pen Inject 20 Units into the skin nightly. 9 mL 3 Unknown at Unknown time   . insulin lispro (HUMALOG) 100 UNIT/ML injection Inject 1-3  Units into the skin nightly as needed (High blood glucose. See administration instructions.).   Unknown at Unknown time   . Insulin Pen Needle 31G X 5 MM Misc INJECT INSULIN DAILY AS DIRECTED 100 each 0 Unknown at Unknown time   . lactobacillus/streptococcus (RISAQUAD) Cap Take 1 capsule by mouth daily. 30 capsule 0 Unknown at Unknown time   . Linagliptin 5 MG Tab Take 1 tablet (5 mg total) by mouth daily. 90 tablet 3 Unknown at Unknown time   . metoprolol succinate XL (TOPROL-XL) 25 MG 24 hr tablet Take 1 tablet (25 mg total) by mouth daily. 90 tablet 3 Unknown at Unknown time   . predniSONE (DELTASONE) 5 MG tablet Take 1 tablet (5 mg total) by mouth daily. 90 tablet 1 Unknown at Unknown time   . sodium bicarbonate 650 MG tablet TAKE 2 TABLETS BY MOUTH TWO TIMES A DAY 120 tablet 2 Unknown at Unknown time     Results of Labs/imaging   Labs have been reviewed:   Coagulation Profile:       CBC review:     Recent Labs  Lab 03/18/17  1812  WBC 6.74   Hgb 13.3   Hematocrit 38.5*   Platelets 143   MCV 91.4   RDW 12   Neutrophils 40.3   Lymphocytes Automated 27.4   Eosinophils Automated 23.0   Immature Granulocyte 0.4   Neutrophils Absolute 2.71   Absolute Immature Granulocyte 0.03     Chem Review:    Recent Labs  Lab 03/18/17  1812   Sodium 127*   Potassium 3.4*   Chloride 86*   CO2 31*   BUN 38.1*   Creatinine 2.3*   Glucose 207*   Calcium 9.9   Bilirubin, Total 0.4   AST (SGOT) 25   ALT 12   Alkaline Phosphatase 70     Results     Procedure Component Value Units Date/Time    Glucose Whole Blood - POCT [[id number]]  (Abnormal) Collected:  03/18/17 2235     Updated:  03/18/17 2238     POCT - Glucose Whole blood 172 (H) mg/dL     Osmolality [[id number]] Collected:  03/18/17 1810    Specimen:  Blood Updated:  03/18/17 2237    B-type Natriuretic Peptide [[id number]] Collected:  03/18/17 1812    Specimen:  Blood Updated:  03/18/17 1912     B-Natriuretic Peptide 46.3 pg/mL     Rapid influenza A/B antigens [[id number]] Collected:   03/18/17 1811    Specimen:  Nasopharyngeal from Nasal Aspirate Updated:  03/18/17 1904    Narrative:       ORDER#: [id number]                                    ORDERED BY: Verl Blalock  SOURCE: Nasal Aspirate                               COLLECTED:  03/18/17 18:11  ANTIBIOTICS AT COLL.:                                RECEIVED :  03/18/17 18:17  Influenza Rapid Antigen A&B                FINAL       03/18/17 19:04  03/18/17   Negative for Influenza A and B             Reference Range: Negative      CBC with differential [[id number]]  (Abnormal) Collected:  03/18/17 1812    Specimen:  Blood from Blood Updated:  03/18/17 1857     WBC 6.74 x10 3/uL      Hgb 13.3 g/dL      Hematocrit 38.5 (L) %      Platelets 143 x10 3/uL      RBC 4.21 (L) x10 6/uL      MCV 91.4 fL      MCH 31.6 pg      MCHC 34.5 g/dL      RDW 12 %      MPV 10.5 fL      Neutrophils 40.3 %      Lymphocytes Automated 27.4 %      Monocytes 8.5 %      Eosinophils Automated 23.0 %      Basophils Automated 0.4 %      Immature Granulocyte 0.4 %      Nucleated RBC 0.0 /100 WBC  Neutrophils Absolute 2.71 x10 3/uL      Abs Lymph Automated 1.85 x10 3/uL      Abs Mono Automated 0.57 x10 3/uL      Abs Eos Automated 1.55 (H) x10 3/uL      Absolute Baso Automated 0.03 x10 3/uL      Absolute Immature Granulocyte 0.03 x10 3/uL      Absolute NRBC 0.00 x10 3/uL     GFR [[id number]] Collected:  03/18/17 1812     Updated:  03/18/17 1846     EGFR 26.9    Comprehensive metabolic panel [[id number]]  (Abnormal) Collected:  03/18/17 1812    Specimen:  Blood Updated:  03/18/17 1846     Glucose 207 (H) mg/dL      BUN 38.1 (H) mg/dL      Creatinine 2.3 (H) mg/dL      Sodium 127 (L) mEq/L      Potassium 3.4 (L) mEq/L      Chloride 86 (L) mEq/L      CO2 31 (H) mEq/L      Calcium 9.9 mg/dL      Protein, Total 6.6 g/dL      Albumin 3.8 g/dL      AST (SGOT) 25 U/L      ALT 12 U/L      Alkaline Phosphatase 70 U/L      Bilirubin, Total 0.4 mg/dL      Globulin 2.8 g/dL       Albumin/Globulin Ratio 1.4     Anion Gap 10.0    Troponin I [[id number]] Collected:  03/18/17 1812    Specimen:  Blood Updated:  03/18/17 1846     Troponin I 0.07 ng/mL     UA with reflex to micro (pts  3 + yrs) [[id number]]  (Abnormal) Collected:  03/18/17 1810    Specimen:  Urine Updated:  03/18/17 1843     Urine Type Clean Catch     Color, UA Yellow     Clarity, UA Clear     Specific Gravity UA 1.013     Urine pH 6.0     Leukocyte Esterase, UA Trace (A)     Nitrite, UA Negative     Protein, UR 100 (A)     Glucose, UA 50 (A)     Ketones UA Negative     Urobilinogen, UA Normal mg/dL      Bilirubin, UA Negative     Blood, UA Negative     RBC, UA 0-2 /hpf      WBC, UA 11-25 (A) /hpf      Squamous Epithelial Cells, Urine 0-5 /hpf         Radiology reports have been reviewed:  Radiology Results (24 Hour)     Procedure Component Value Units Date/Time    XR Chest  AP Portable [[id number]] Collected:  03/18/17 1825    Order Status:  Completed Updated:  03/18/17 1830    Narrative:       HISTORY:  Cough.    COMPARISON:  01/04/2017    PORTABLE CHEST:  Left-sided dual-lead pacemaker unchanged.     The heart is enlarged.  The aorta is atherosclerotic. The pulmonary  vascular pattern is unremarkable. There is stable elevation of the right  hemidiaphragm. The lungs are clear. There is no effusion. There is no  pneumothorax.    Bony structures are unremarkable.      Impression:         No acute cardiopulmonary disease.  Virl Diamond, MD   03/18/2017 6:26 PM        EKG: EKG reviewed   Last EKG Result     Procedure Component Value Units Date/Time    ECG 12 lead [[id number]] Collected:  03/18/17 1813     Updated:  03/18/17 1814     Ventricular Rate 60 BPM      Atrial Rate 82 BPM      P-R Interval 140 ms      QRS Duration 168 ms      Q-T Interval 542 ms      QTC Calculation (Bezet) 542 ms      P Axis 102 degrees      R Axis -71 degrees      T Axis 101 degrees     Narrative:       Atrial-paced rhythm  LEFT AXIS  DEVIATION  NON-SPECIFIC INTRA-VENTRICULAR CONDUCTION BLOCK  POSSIBLE LATERAL MYOCARDIAL INFARCTION , AGE UNDETERMINED  INFERIOR MYOCARDIAL INFARCTION , AGE UNDETERMINED  ABNORMAL ECG  WHEN COMPARED WITH ECG OF 18-Aug-2016 11:50,  ELECTRONIC ATRIAL PACEMAKER HAS REPLACED ELECTRONIC VENTRICULAR PACEMAKER    EKG SCAN [[id number]] Resulted:  03/18/17 1814     Updated:  03/18/17 1814        Hospitalist   Signed by:   Baxter Flattery  03/18/2017 10:52 PM    *This note was generated by the Epic EMR system/ Dragon speech recognition and may contain inherent errors or omissions not intended by the user. Grammatical errors, random word insertions, deletions, pronoun errors and incomplete sentences are occasional consequences of this technology due to software limitations. Not all errors are caught or corrected. If there are questions or concerns about the content of this note or information contained within the body of this dictation they should be addressed directly with the author for clarification

## 2017-03-18 NOTE — Progress Notes (Signed)
Pt. received from [facility] via stretchier accompanied by family members to room 204. Admitted for  Hyponatremia/UTI.  Pt.'s on IVF/IV ABX. Pt's alert and orientedx4, Pt. don't speak English family member helping with translation.no c/o CP/SOB/pain at this time. Pt/family members oriented with POC/room/unit, call light as needed. Safety measures inplace. Will continue plan of care.

## 2017-03-19 ENCOUNTER — Inpatient Hospital Stay: Payer: Medicare Other

## 2017-03-19 ENCOUNTER — Encounter: Payer: Self-pay | Admitting: Internal Medicine

## 2017-03-19 DIAGNOSIS — N3000 Acute cystitis without hematuria: Secondary | ICD-10-CM | POA: Diagnosis present

## 2017-03-19 DIAGNOSIS — IMO0001 Reserved for inherently not codable concepts without codable children: Secondary | ICD-10-CM

## 2017-03-19 DIAGNOSIS — E782 Mixed hyperlipidemia: Secondary | ICD-10-CM | POA: Diagnosis present

## 2017-03-19 LAB — COMPREHENSIVE METABOLIC PANEL
ALT: 10 U/L (ref 0–55)
AST (SGOT): 21 U/L (ref 5–34)
Albumin/Globulin Ratio: 1.3 (ref 0.9–2.2)
Albumin: 3.1 g/dL — ABNORMAL LOW (ref 3.5–5.0)
Alkaline Phosphatase: 57 U/L (ref 38–106)
Anion Gap: 8 (ref 5.0–15.0)
BUN: 29.6 mg/dL — ABNORMAL HIGH (ref 9.0–28.0)
Bilirubin, Total: 0.4 mg/dL (ref 0.2–1.2)
CO2: 27 mEq/L (ref 22–29)
Calcium: 9 mg/dL (ref 7.9–10.2)
Chloride: 96 mEq/L — ABNORMAL LOW (ref 100–111)
Creatinine: 1.8 mg/dL — ABNORMAL HIGH (ref 0.7–1.3)
Globulin: 2.4 g/dL (ref 2.0–3.6)
Glucose: 151 mg/dL — ABNORMAL HIGH (ref 70–100)
Potassium: 3.4 mEq/L — ABNORMAL LOW (ref 3.5–5.1)
Protein, Total: 5.5 g/dL — ABNORMAL LOW (ref 6.0–8.3)
Sodium: 131 mEq/L — ABNORMAL LOW (ref 136–145)

## 2017-03-19 LAB — ECG 12-LEAD
Atrial Rate: 82 {beats}/min
P Axis: 102 degrees
P-R Interval: 140 ms
Q-T Interval: 542 ms
QRS Duration: 168 ms
QTC Calculation (Bezet): 542 ms
R Axis: -71 degrees
T Axis: 101 degrees
Ventricular Rate: 60 {beats}/min

## 2017-03-19 LAB — MAN DIFF ONLY
Band Neutrophils Absolute: 0 10*3/uL (ref 0.00–1.00)
Band Neutrophils: 0 %
Basophils Absolute Manual: 0 10*3/uL (ref 0.00–0.20)
Basophils Manual: 0 %
Eosinophils Absolute Manual: 1.12 10*3/uL — ABNORMAL HIGH (ref 0.00–0.70)
Eosinophils Manual: 19 %
Lymphocytes Absolute Manual: 1.6 10*3/uL (ref 0.50–4.40)
Lymphocytes Manual: 27 %
Monocytes Absolute: 0.47 10*3/uL (ref 0.00–1.20)
Monocytes Manual: 8 %
Neutrophils Absolute Manual: 2.72 10*3/uL (ref 1.80–8.10)
Segmented Neutrophils: 46 %

## 2017-03-19 LAB — CBC AND DIFFERENTIAL
Absolute NRBC: 0 10*3/uL
Hematocrit: 34 % — ABNORMAL LOW (ref 42.0–52.0)
Hgb: 11.7 g/dL — ABNORMAL LOW (ref 13.0–17.0)
MCH: 31.4 pg (ref 28.0–32.0)
MCHC: 34.4 g/dL (ref 32.0–36.0)
MCV: 91.2 fL (ref 80.0–100.0)
MPV: 10.2 fL (ref 9.4–12.3)
Nucleated RBC: 0 /100 WBC (ref 0.0–1.0)
Platelets: 134 10*3/uL — ABNORMAL LOW (ref 140–400)
RBC: 3.73 10*6/uL — ABNORMAL LOW (ref 4.70–6.00)
RDW: 12 % (ref 12–15)
WBC: 5.91 10*3/uL (ref 3.50–10.80)

## 2017-03-19 LAB — GLUCOSE WHOLE BLOOD - POCT
Whole Blood Glucose POCT: 129 mg/dL — ABNORMAL HIGH (ref 70–100)
Whole Blood Glucose POCT: 210 mg/dL — ABNORMAL HIGH (ref 70–100)
Whole Blood Glucose POCT: 226 mg/dL — ABNORMAL HIGH (ref 70–100)
Whole Blood Glucose POCT: 257 mg/dL — ABNORMAL HIGH (ref 70–100)

## 2017-03-19 LAB — GFR: EGFR: 35.7

## 2017-03-19 LAB — MAGNESIUM: Magnesium: 1.6 mg/dL (ref 1.6–2.6)

## 2017-03-19 LAB — CELL MORPHOLOGY
Cell Morphology: NORMAL
Platelet Estimate: DECREASED — AB

## 2017-03-19 LAB — OSMOLALITY, URINE: Urine Osmolality: 193 mosm/kg — ABNORMAL LOW (ref 300–1094)

## 2017-03-19 LAB — SODIUM, URINE, RANDOM: Urine Sodium Random: 36 mEq/L

## 2017-03-19 MED ORDER — LACTULOSE 10 GM/15ML PO SOLN
30.00 g | Freq: Once | ORAL | Status: AC
Start: 2017-03-19 — End: 2017-03-19
  Administered 2017-03-19: 18:00:00 30 g via ORAL
  Filled 2017-03-19: qty 30

## 2017-03-19 MED ORDER — BISACODYL 10 MG RE SUPP
10.00 mg | Freq: Once | RECTAL | Status: AC
Start: 2017-03-19 — End: 2017-03-19
  Administered 2017-03-19: 18:00:00 10 mg via RECTAL
  Filled 2017-03-19: qty 1

## 2017-03-19 MED ORDER — POLYETHYLENE GLYCOL 3350 17 G PO PACK
17.00 g | PACK | Freq: Every day | ORAL | Status: DC | PRN
Start: 2017-03-19 — End: 2017-03-20
  Administered 2017-03-19: 12:00:00 17 g via ORAL
  Filled 2017-03-19: qty 1

## 2017-03-19 MED ORDER — POTASSIUM CHLORIDE CRYS ER 20 MEQ PO TBCR
40.00 meq | EXTENDED_RELEASE_TABLET | Freq: Once | ORAL | Status: AC
Start: 2017-03-19 — End: 2017-03-19
  Administered 2017-03-19: 12:00:00 40 meq via ORAL
  Filled 2017-03-19: qty 2

## 2017-03-19 NOTE — Consults (Signed)
Nutritional Support Services  Nutrition Education    Referral Source: MD  Reason for Referral: DM    Who was educated:  Daughter- also translating     Readiness to learn:  Acceptance    Education Details:  Reviewed consistent CHO diet with daughter.  Daughter reports client consuming caloric/sugary beverages; consumes Ensure daily along with juice and regular soda.  Discussed importance of sugar free beverages.  Reviewed diluting juice to 1/2 juice and water; diet soda; Glucerna supplement.  Daughter receptive and is the one who purchases the food at home.    Type of Instruction Provided:  Verbal    Level of Understanding:  Verbalizes understanding      Recommendations:  1. Change diet to Consistent CHO/Cardiac  2. Add trial of Glucerna- PO BID     Jaydrian Corpening L. Arlina Robes, MS, RDN, LDN, [facility]  Nutrition Office x [phone]

## 2017-03-19 NOTE — Plan of Care (Signed)
Problem: Safety  Goal: Patient will be free from injury during hospitalization  Outcome: Progressing  Patient fall risk; non skid socks on, bed exit alarm on, call bell within reach.  hourly rounding ongoing.   03/18/17 2344   Goal/Interventions addressed this shift   Patient will be free from injury during hospitalization  Assess patient's risk for falls and implement fall prevention plan of care per policy;Provide and maintain safe environment;Use appropriate transfer methods;Ensure appropriate safety devices are available at the bedside;Hourly rounding;Include patient/ family/ care giver in decisions related to safety         Problem: Pain  Goal: Pain at adequate level as identified by patient  Outcome: Progressing  Patient denies any pain at this time, will continue to monitor.   03/18/17 2344   Goal/Interventions addressed this shift   Pain at adequate level as identified by patient Evaluate if patient comfort function goal is met;Evaluate patient's satisfaction with pain management progress;Identify patient comfort function goal;Assess pain on admission, during daily assessment and/or before any "as needed" intervention(s);Reassess pain within 30-60 minutes of any procedure/intervention, per Pain Assessment, Intervention, Reassessment (AIR) Cycle       Problem: Psychosocial and Spiritual Needs  Goal: Demonstrates ability to cope with hospitalization/illness  Outcome: Progressing  Patient compliant and cooperative, and understands plan of care.   03/19/17 1437   Goal/Interventions addressed this shift   Demonstrates ability to cope with hospitalizations/illness Encourage verbalization of feelings/concerns/expectations;Provide quiet environment;Assist patient to identify own strengths and abilities;Encourage patient to set small goals for self;Reinforce positive adaptation of new coping behaviors;Encourage participation in diversional activity;Include patient/ patient care companion in decisions;Communicate  referral to spiritual care as appropriate       Problem: Compromised Tissue integrity  Goal: Damaged tissue is healing and protected  Outcome: Progressing   03/19/17 1437   Goal/Interventions addressed this shift   Damaged tissue is healing and protected  Monitor/assess Braden scale every shift;Provide wound care per wound care algorithm;Reposition patient every 2 hours and as needed unless able to reposition self;Increase activity as tolerated/progressive mobility;Relieve pressure to bony prominences for patients at moderate and high risk;Avoid shearing injuries;Keep intact skin clean and dry;Use bath wipes, not soap and water, for daily bathing;Use incontinence wipes for cleaning urine, stool and caustic drainage. Foley care as needed;Monitor external devices/tubes for correct placement to prevent pressure, friction and shearing;Encourage use of lotion/moisturizer on skin;Monitor patient's hygiene practices

## 2017-03-19 NOTE — Consults (Signed)
[facility]  Consultation    Date Time: 03/19/17 9:37 AM  Patient Name: Marco [facility]  Medical Record Number: [id number]   Primary Care Physician: @[email or web address] @   Requesting Physician: Fernande Boyden, MD    Reason for Consultation:   AKI  Assessment:   -AKI  -CKD stage III based on records baseline appears to be 1.6.   -Hyponatremia  -UTI  -Anemia  -Cough:likely bronchitis  -Hypertension  -DM II  Plan:     IV fluids   Fluid restriction 1L per day   ABX once awaiting C/S   Daily labs   Will follow    We will follow this patient closely with you. Thank you for allowing [place] to participate in the care of this patient.    Dayna Barker, MD  Office - [phone]    History:   Marco [facility] is a 83 y.o. male who [facility] with hx of DM II,known hx of CKD admitted with weakness, cough and nausea. Found to have hyponatremia and UTI    On review of records patient has h/o CKD dating back to 2007 with h/o recurrent AKI. Baseline Cr 1.6, UA no hematuria. USG 08/2016 RK 10cm, LK 10.5cm    Past Medical and Surgical  History:     Past Medical History:   Diagnosis Date   . Acute diastolic heart failure 55/97/4163   . Acute on chronic diastolic congestive heart failure 08/13/2016   . Acute pulmonary edema 05/26/2015   . ARF (acute renal failure) 05/26/2015   . Chronic diastolic heart failure 84/53/6468   . Chronic gout    . Chronic pain syndrome 07/07/2015   . CKD (chronic kidney disease) stage 4, GFR 15-29 ml/min     2018 baseline creatinine in EPIC is 1.5-2.3.   . Congestive heart failure (CHF)     EF 40% 5/18 echo, on lasix   . Current chronic use of systemic steroids     For Polymyalgia rheumatica   . Essential hypertension    . Mixed hyperlipidemia    . NSTEMI (non-ST elevated myocardial infarction) 08/13/2016   . Osteoarthritis of both knees    . Peripheral edema 06/16/2015   . Pneumonia due to infectious organism, unspecified laterality, unspecified part of lung 06/25/2015   .  Polymyalgia rheumatica     chronic steroids   . 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 Second degree heart block by electrocardiogram (ECG) 04/27/2015   . Type 2 diabetes mellitus with complication, with long-term current use of insulin     12/01/2016 Hemoglobin A1c 8.2%     Past Surgical History:   Procedure Laterality Date   . ABDOMINAL SURGERY     . PACEMAKER Left 2017     Family History:   History reviewed. No pertinent family history.  Social History:     Social History     Social History   . Marital status: Married     Spouse name: N/A   . Number of children: N/A   . Years of education: N/A     Occupational History   . Not on file.     Social History Main Topics   . Smoking status: Never Smoker   . Smokeless tobacco: Never Used   . Alcohol use No      Comment: former alcoholic   . Drug use: No   . Sexual activity: Not on file     Other Topics Concern   . Not  on file     Social History Narrative   . No narrative on file     Allergies:   No Known Allergies  Medications:     Current Facility-Administered Medications   Medication Dose Route Frequency   . allopurinol  100 mg Oral Daily   . atorvastatin  20 mg Oral Daily   . cefTRIAXone  1 g Intravenous Q24H   . docusate sodium  100 mg Oral Daily   . enoxaparin  30 mg Subcutaneous Daily   . fluticasone  1 spray Each Nare BID   . gabapentin  100 mg Oral Q8H [place]   . hydrALAZINE  50 mg Oral QAM   . insulin glargine  10 Units Subcutaneous QHS   . lactobacillus/streptococcus  1 capsule Oral Daily   . metoprolol succinate XL  25 mg Oral Daily   . NIFEdipine  60 mg Oral Daily   . pantoprazole  40 mg Oral QAM AC   . potassium chloride  40 mEq Oral Once   . predniSONE  5 mg Oral After breakfast   . SITagliptin  25 mg Oral Daily   . sodium bicarbonate  1,300 mg Oral BID   . valsartan  80 mg Oral Daily     Review of Systems:   10 systems were reviewed and were negative  Physical Exam:   Temp:  [97.3 F (36.3 C)-98.5 F (36.9 C)] 97.3 F (36.3 C)  Heart Rate:  [60-90]  67  Resp Rate:  [16-18] 18  BP: (144-160)/(64-89) 144/82    Intake and Output Summary (Last 24 hours) at Date Time    Intake/Output Summary (Last 24 hours) at 03/19/17 0937  Last data filed at 03/19/17 0830   Gross per 24 hour   Intake              354 ml   Output                0 ml   Net              354 ml       Gen: WN WD NAD  HEENT: NC/AT, moist MM, clear oropharynx  Neck: No JVD, Supple  CV: S1 S2 N RRR no M/R/G/C, no edema  Chest: Good effort, CTAB, symmetric expansion  Ab: ND NT Soft no HSM +BS  Skin: Dry, intact  Ext: Warm, no cords  Psych: Appropriate mood and affect    Labs:     Recent Labs  Lab 03/19/17  0439 03/18/17  1812   Glucose 151* 207*   BUN 29.6* 38.1*   Creatinine 1.8* 2.3*   Calcium 9.0 9.9   Sodium 131* 127*   Potassium 3.4* 3.4*   Chloride 96* 86*   CO2 27 31*   Albumin 3.1* 3.8   Magnesium 1.6  --        Recent Labs  Lab 03/19/17  0439 03/18/17  1812   WBC 5.91 6.74   RBC 3.73* 4.21*   Hgb 11.7* 13.3   Hematocrit 34.0* 38.5*   MCV 91.2 91.4   MCH 31.4 31.6   MCHC 34.4 34.5   RDW 12 12   MPV 10.2 10.5   Platelets 134* 143     Radiology:   Radiological Procedure reviewed.   EKG:   Reviewed  Prior Records:   I reviewed the old records.

## 2017-03-19 NOTE — UM Notes (Signed)
[facility] Utilization Review  NPI # [id number]  Please call Iriel Nason at [phone] with any questions or concerns.   Fax final authorization and request for additional information to [phone].    83 yr old male presenting to ED 03/18/17 with c/o generalized weakness, cough, fatigue and vomiting x 5-7 days.    Patient admitted inpatient same date @ 3. DX: Hyponatremia    VS: Temp 98.5, HR 60, RR 18, BP 160/71    Past Medical History:   Diagnosis Date   . Acute diastolic heart failure 79/39/0300   . Acute on chronic diastolic congestive heart failure 08/13/2016   . Acute pulmonary edema 05/26/2015   . ARF (acute renal failure) 05/26/2015   . Chronic diastolic heart failure 92/33/0076   . Chronic gout    . Chronic pain syndrome 07/07/2015   . CKD (chronic kidney disease) stage 4, GFR 15-29 ml/min     2018 baseline creatinine in EPIC is 1.5-2.3.   . Congestive heart failure (CHF)     EF 40% 5/18 echo, on lasix   . Current chronic use of systemic steroids     For Polymyalgia rheumatica   . Essential hypertension    . Mixed hyperlipidemia    . NSTEMI (non-ST elevated myocardial infarction) 08/13/2016   . Osteoarthritis of both knees    . Peripheral edema 06/16/2015   . Pneumonia due to infectious organism, unspecified laterality, unspecified part of lung 06/25/2015   . Polymyalgia rheumatica     chronic steroids   . 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 Second degree heart block by electrocardiogram (ECG) 04/27/2015   . Type 2 diabetes mellitus with complication, with long-term current use of insulin     12/01/2016 Hemoglobin A1c 8.2%     Abn labs  Glucose 207  BUN 38.1  Creat 2.3  Sodium 127  Potassium 3.4  CO2 86    UA: +trace  Leukocyte, +protein, +glucose, WBC 11-25    ED meds  0.9% ns iv  Rocephin iv    ---Admit to Tele inpatient  Orders  Scheduled Meds:  Current Facility-Administered Medications   Medication Dose Route Frequency   . allopurinol  100 mg Oral Daily   . atorvastatin  20 mg  Oral Daily   . cefTRIAXone  1 g Intravenous Q24H   . docusate sodium  100 mg Oral Daily   . enoxaparin  30 mg Subcutaneous Daily   . fluticasone  1 spray Each Nare BID   . gabapentin  100 mg Oral Q8H [place]   . hydrALAZINE  50 mg Oral QAM   . insulin glargine  10 Units Subcutaneous QHS   . lactobacillus/streptococcus  1 capsule Oral Daily   . metoprolol succinate XL  25 mg Oral Daily   . NIFEdipine  60 mg Oral Daily   . pantoprazole  40 mg Oral QAM AC   . potassium chloride  40 mEq Oral Once   . predniSONE  5 mg Oral After breakfast   . SITagliptin  25 mg Oral Daily   . sodium bicarbonate  1,300 mg Oral BID   . valsartan  80 mg Oral Daily     Continuous Infusions:  . sodium chloride 75 mL/hr at 03/18/17 2318     PRN Meds:.acetaminophen, benzonatate, cyclobenzaprine, Nursing communication: Adult Hypoglycemia Treatment Algorithm **AND** dextrose **AND** dextrose **AND** glucagon (rDNA), guaiFENesin-codeine, insulin lispro, insulin lispro, naloxone, ondansetron     Nephrology consult  Tele monitoring  VS Q4  Attending H&P  Assessment and Plan:   Hyponatremia  Acute kidney injury  - Most likely due to volume depletion  - Continue IV fluids  - Monitor BMP every 6 hours  - Nephrology consult in the a.m.   - Avoid Nephrotoxins  - Get Renal US if no improvement    Urinary tract infection  - Continue Rocephin and probiotics  - Follow up on urine cultures    Possible viral syndrome with nonproductive cough and congestion  No fever.  WBC normal, no infiltrate seen on chest x-ray - unlikely pneumonia  - Tessalon as needed for cough

## 2017-03-19 NOTE — Progress Notes (Signed)
[facility]  Progress Note  Patient Info:   Date/Time: 03/19/2017 / 4:41 PM   Admit Date:03/18/2017  Patient Name:Marco [facility]   VWU:[id number]   PCP: Darlina Sicilian June, DO  Attending Physician:Chaseton Yepiz, Marquis Buggy, MD     Assessment and Plan:     83 years old man with history of diabetes, CK D stage IV, hypertension, diastolic heart failure and severe valve disease with severe mitral regurgitation, tricuspid regurgitation came to hospital with complaints of worsening fatigue and dry cough for the past 5 days.    # Bronchitis.  Continue with symptomatic treatment for cough  Chest x-ray negative for pneumonia    # Suspected Urinary tract infection -asymptomatic.  Follow-up cultures.  Continue ceftriaxone    # Hyponatremia-improving.    # AKI on CKD stage 4   Nephrology Dr. Laddie Aquas consulted, appreciate her input    # Diabetes  Continue Lantus 10 units.  Continue sliding scale  Continue Januvia    # Hypertension-well controlled.  Continue metoprolol.  Valsartan, nifedipine     # History of polymyalgia rheumatica.  Continue steroids    # Diastolic heart failure-not an acute exacerbation   Lasix on hold due to acute kidney injury    # History of gout.  Continue allopurinol    # History of hyperlipidemia.  Continue Lipitor    # Constipation  We will add MiraLAX    # Disposition plan is to discharge tomorrow    Discussed with patient and daughter present at bedside, answered all his questions.    DVT Prohylaxis:SEDs   Central Line/Foley Catheter/PICC line status: none  Code Status: Full Code  Disposition:home  Type of Admission:Inpatient  Expected Date of Discharge: tomorrow    Hospital Problems:   Principal Problem:    Acute cystitis without hematuria  Active Problems:    Stage 4 chronic kidney disease    Renovascular hypertension    Hyponatremia    Essential hypertension    Hypokalemia    Mixed hyperlipidemia    Type 2 diabetes mellitus with complication, with long-term current use of insulin    Subjective:    03/19/17 patient feels better, has no new complaints  Chief Complaint:  Generalized weakness; Cough; and Nausea    Review of Systems   Constitutional: Negative for chills and fever.   Eyes: Negative for blurred vision and double vision.   Respiratory: Positive for cough. Negative for hemoptysis, sputum production, shortness of breath and wheezing.    Cardiovascular: Negative for chest pain and palpitations.   Gastrointestinal: Negative for heartburn.   Genitourinary: Negative for dysuria, flank pain, frequency, hematuria and urgency.   Musculoskeletal: Negative for myalgias and neck pain.   Skin: Negative for itching and rash.     Objective:     Vitals:    03/19/17 0043 03/19/17 0545 03/19/17 1122 03/19/17 1315   BP: 153/89 144/82 169/81 155/69   Pulse: 78 67 66 79   Resp: 17 18 18 18    Temp: 98 F (36.7 C) 97.3 F (36.3 C) 98.3 F (36.8 C) 98.1 F (36.7 C)   TempSrc:   Oral Oral   SpO2: 99% 98% 96% 96%   Weight:  70 kg (154 lb 4.5 oz)     Height:         Physical Exam:   Physical Exam   Constitutional: He is oriented to person, place, and time and well-developed, well-nourished, and in no distress. No distress.   HENT:   Head: Normocephalic  and atraumatic.   Eyes: Conjunctivae are normal.   Neck: Neck supple. No JVD present.   Cardiovascular: Normal rate and normal heart sounds.  Exam reveals no gallop.    No murmur heard.  Pulmonary/Chest: Effort normal. No respiratory distress. He has no wheezes.   Abdominal: Soft. He exhibits no distension. There is no tenderness. There is no rebound.   Musculoskeletal: He exhibits no edema or tenderness.   Neurological: He is alert and oriented to person, place, and time.   Skin: Skin is warm and dry. He is not diaphoretic.   Vitals reviewed.    Results of Labs/imaging   Labs and radiology reports have been reviewed.    Hospitalist   Signed by:   Marquis Buggy Marco [facility]  03/19/2017 4:41 PM    *This note was generated by the Epic EMR system/ Dragon speech recognition and may  contain inherent errors or omissions not intended by the user. Grammatical errors, random word insertions, deletions, pronoun errors and incomplete sentences are occasional consequences of this technology due to software limitations. Not all errors are caught or corrected. If there are questions or concerns about the content of this note or information contained within the body of this dictation they should be addressed directly with the author for clarification

## 2017-03-20 DIAGNOSIS — E118 Type 2 diabetes mellitus with unspecified complications: Secondary | ICD-10-CM

## 2017-03-20 DIAGNOSIS — N184 Chronic kidney disease, stage 4 (severe): Secondary | ICD-10-CM

## 2017-03-20 DIAGNOSIS — N3000 Acute cystitis without hematuria: Secondary | ICD-10-CM

## 2017-03-20 LAB — BASIC METABOLIC PANEL
Anion Gap: 6 (ref 5.0–15.0)
BUN: 20.1 mg/dL (ref 9.0–28.0)
CO2: 25 mEq/L (ref 22–29)
Calcium: 8.8 mg/dL (ref 7.9–10.2)
Chloride: 99 mEq/L — ABNORMAL LOW (ref 100–111)
Creatinine: 1.8 mg/dL — ABNORMAL HIGH (ref 0.7–1.3)
Glucose: 133 mg/dL — ABNORMAL HIGH (ref 70–100)
Potassium: 3.5 mEq/L (ref 3.5–5.1)
Sodium: 130 mEq/L — ABNORMAL LOW (ref 136–145)

## 2017-03-20 LAB — CBC
Absolute NRBC: 0 10*3/uL
Hematocrit: 32.9 % — ABNORMAL LOW (ref 42.0–52.0)
Hgb: 11.3 g/dL — ABNORMAL LOW (ref 13.0–17.0)
MCH: 31.3 pg (ref 28.0–32.0)
MCHC: 34.3 g/dL (ref 32.0–36.0)
MCV: 91.1 fL (ref 80.0–100.0)
MPV: 9.5 fL (ref 9.4–12.3)
Nucleated RBC: 0 /100 WBC (ref 0.0–1.0)
Platelets: 161 10*3/uL (ref 140–400)
RBC: 3.61 10*6/uL — ABNORMAL LOW (ref 4.70–6.00)
RDW: 12 % (ref 12–15)
WBC: 6.63 10*3/uL (ref 3.50–10.80)

## 2017-03-20 LAB — GFR: EGFR: 35.7

## 2017-03-20 LAB — GLUCOSE WHOLE BLOOD - POCT
Whole Blood Glucose POCT: 113 mg/dL — ABNORMAL HIGH (ref 70–100)
Whole Blood Glucose POCT: 271 mg/dL — ABNORMAL HIGH (ref 70–100)

## 2017-03-20 MED ORDER — AMOXICILLIN-POT CLAVULANATE 500-125 MG PO TABS
1.00 | ORAL_TABLET | Freq: Every day | ORAL | 0 refills | Status: AC
Start: 2017-03-20 — End: 2017-03-27

## 2017-03-20 NOTE — Progress Notes (Signed)
[facility].  Progress Note    Assessment:  -AKI  -CKD stage III based on records baseline appears to be 1.6.   -Hyponatremia  -UTI  -Anemia  -Cough:likely bronchitis  -Hypertension  -DM II    Plan:     Fluid restriction 1L per day   ABX once awaiting C/S   Daily labs   Will follow    Dayna Barker, MD  Office - [phone]  ++++++++++++++++++++++++++++++++++++++++++++++++++++++++++++++  Subjective:  No new complaints    Medications:  Scheduled Meds:  Current Facility-Administered Medications   Medication Dose Route Frequency   . allopurinol  100 mg Oral Daily   . atorvastatin  20 mg Oral Daily   . cefTRIAXone  1 g Intravenous Q24H   . docusate sodium  100 mg Oral Daily   . enoxaparin  30 mg Subcutaneous Daily   . fluticasone  1 spray Each Nare BID   . gabapentin  100 mg Oral Q8H [place]   . hydrALAZINE  50 mg Oral QAM   . insulin glargine  10 Units Subcutaneous QHS   . lactobacillus/streptococcus  1 capsule Oral Daily   . metoprolol succinate XL  25 mg Oral Daily   . NIFEdipine  60 mg Oral Daily   . pantoprazole  40 mg Oral QAM AC   . predniSONE  5 mg Oral After breakfast   . SITagliptin  25 mg Oral Daily   . sodium bicarbonate  1,300 mg Oral BID   . valsartan  80 mg Oral Daily     Continuous Infusions:  PRN Meds:acetaminophen, benzonatate, cyclobenzaprine, Nursing communication: Adult Hypoglycemia Treatment Algorithm **AND** dextrose **AND** dextrose **AND** glucagon (rDNA), guaiFENesin-codeine, insulin lispro, insulin lispro, naloxone, ondansetron, polyethylene glycol    Objective:  Vital signs in last 24 hours:  Temp:  [97.9 F (36.6 C)-98.4 F (36.9 C)] 98.3 F (36.8 C)  Heart Rate:  [66-79] 79  Resp Rate:  [16-19] 18  BP: (125-169)/(64-88) 133/68  Intake/Output last 24 hours:    Intake/Output Summary (Last 24 hours) at 03/20/17 0900  Last data filed at 03/20/17 0600   Gross per 24 hour   Intake              400 ml   Output              225 ml   Net              175 ml      Intake/Output this shift:  No intake/output data recorded.    Physical Exam:   Gen: WD WN NAD   CV: S1 S2 N RRR   Chest: CTAB   Ab: ND NT soft no HSM +BS   Ext: No C/E    Labs:    Recent Labs  Lab 03/19/17  0439 03/18/17  1812   Glucose 151* 207*   BUN 29.6* 38.1*   Creatinine 1.8* 2.3*   Calcium 9.0 9.9   Sodium 131* 127*   Potassium 3.4* 3.4*   Chloride 96* 86*   CO2 27 31*   Albumin 3.1* 3.8   Magnesium 1.6  --        Recent Labs  Lab 03/20/17  0831 03/19/17  0439 03/18/17  1812   WBC 6.63 5.91 6.74   Hgb 11.3* 11.7* 13.3   Hematocrit 32.9* 34.0* 38.5*   MCV 91.1 91.2 91.4   MCH 31.3 31.4 31.6   MCHC 34.3 34.4 34.5   RDW 12  12 12   MPV 9.5 10.2 10.5   Platelets 161 134* 143

## 2017-03-20 NOTE — Discharge Instr - AVS First Page (Signed)
Reason for your Hospital Admission:  Bronchitis , UTI       Instructions for after your discharge:  Follow up with PCP in 1 week.

## 2017-03-20 NOTE — Plan of Care (Signed)
Problem: Safety  Goal: Patient will be free from injury during hospitalization  Outcome: Progressing   03/18/17 2344   Goal/Interventions addressed this shift   Patient will be free from injury during hospitalization  Assess patient's risk for falls and implement fall prevention plan of care per policy;Provide and maintain safe environment;Use appropriate transfer methods;Ensure appropriate safety devices are available at the bedside;Hourly rounding;Include patient/ family/ care giver in decisions related to safety     Goal: Patient will be free from infection during hospitalization  Outcome: Progressing   03/18/17 2344   Goal/Interventions addressed this shift   Free from Infection during hospitalization Assess and monitor for signs and symptoms of infection;Monitor lab/diagnostic results;Monitor all insertion sites (i.e. indwelling lines, tubes, urinary catheters, and drains)       Problem: Pain  Goal: Pain at adequate level as identified by patient  Outcome: Progressing   03/18/17 2344   Goal/Interventions addressed this shift   Pain at adequate level as identified by patient Evaluate if patient comfort function goal is met;Evaluate patient's satisfaction with pain management progress;Identify patient comfort function goal;Assess pain on admission, during daily assessment and/or before any "as needed" intervention(s);Reassess pain within 30-60 minutes of any procedure/intervention, per Pain Assessment, Intervention, Reassessment (AIR) Cycle

## 2017-03-20 NOTE — Progress Notes (Signed)
Went over [place] instructions with pt. Pt acknowledged understanding. Went over education of new medicines. Prescriptions antibiotic sent to pharmacy and belongings with pt. Pt wheeled down by staff. Taken home via car by family.

## 2017-03-20 NOTE — Discharge Summary (Signed)
[facility] HOSPITALIST   [place] Summary   Patient Info:   Date/Time: 03/20/2017 / 2:01 PM   Admit Date:03/18/2017  Patient Name:Marco Bruce   UMP:[id number]   PCP: Darlina Sicilian June, DO  Attending Physician:No att. providers found     Hospital Course:   Please see H&P for complete details of HPI and ROS. The patient was admitted to [facility] and has been taken care as mentioned below.      83 years old man with history of diabetes, CK D stage IV, hypertension, diastolic heart failure and severe valve disease with severe mitral regurgitation, tricuspid regurgitation came to hospital with complaints of worsening fatigue and dry cough for the past 5 days.    # Bronchitis.  Continue with symptomatic treatment for cough  Chest x-ray negative for pneumonia  Will discharge on Augmentin     # Suspected Urinary tract infection -asymptomatic.  Cultures still pending   Will discharge on Augmentin and if the cultures are not sensitive to it, will call patient and change prescription. Discussed with patient and his daughter present at bedside, they do not want to wait another day for culture results.     # Hyponatremia-improving.    # AKI on CKD stage 4 - resolved , kidney function at baseline   Nephrology Dr. Laddie Aquas consulted, appreciate her input    # Diabetes  Continue Lantus 10 units.  Continue sliding scale  Continue Januvia    # Hypertension-well controlled.  Continue metoprolol.  Valsartan, nifedipine     # History of polymyalgia rheumatica.  Continue steroids    # Diastolic heart failure-not an acute exacerbation   May resume Lasix at home     # History of gout.  Continue allopurinol    # History of hyperlipidemia.  Continue Lipitor    # Constipation  We will add MiraLAX    Disposition:home  Condition at Discharge and Prognosis: stable   Admission Date:03/18/2017  Discharge Date: 03/20/17  Type of Admission:Inpatient   Code Status: Full Code  Subjective at the time of discharge:   Patient feels well  and has no complaints, he is hemodynamically stable to be discharged.   Chief Complaint:  Generalized weakness; Cough; and Nausea    Objective:     Vitals:    03/20/17 0119 03/20/17 0447 03/20/17 0859 03/20/17 1127   BP: 134/88 128/87 133/68 148/69   Pulse: 77 79  66   Resp: 19 18  18    Temp: 97.9 F (36.6 C) 98.3 F (36.8 C)  98.1 F (36.7 C)   TempSrc:       SpO2: 98% 99%  99%   Weight:  69.9 kg (154 lb)     Height:         Physical Exam:   Physical Exam   Constitutional: He is oriented to person, place, and time. He appears well-developed and well-nourished.   HENT:   Head: Normocephalic and atraumatic.   Eyes: Conjunctivae are normal.   Neck: Neck supple. No JVD present.   Cardiovascular: Normal rate, regular rhythm, normal heart sounds and intact distal pulses.    Pulmonary/Chest: Effort normal and breath sounds normal. No respiratory distress. He has no wheezes. He has no rales.   Abdominal: Soft. Bowel sounds are normal. He exhibits no distension. There is no tenderness. There is no rebound.   Musculoskeletal: He exhibits no edema or tenderness.   Neurological: He is alert and oriented to person, place, and time.  Skin: Skin is warm and dry.   Psychiatric: He has a normal mood and affect. His behavior is normal.   Vitals reviewed.      Clinical Presentation:   History of Presenting Illness: Please refer to HPI in the Detailed H&P  Discharge Medications:   Discharge Medications:      Discharge Medication List      Taking    allopurinol 100 MG tablet  Dose:  100 mg  Commonly known as:  ZYLOPRIM  Take 1 tablet (100 mg total) by mouth daily.     amoxicillin-clavulanate 500-125 MG per tablet  Dose:  1 tablet  Commonly known as:  AUGMENTIN  Take 1 tablet by mouth daily.for 7 days     atorvastatin 20 MG tablet  Dose:  20 mg  Commonly known as:  LIPITOR  Take 1 tablet (20 mg total) by mouth daily.     benzonatate 200 MG capsule  Dose:  200 mg  Commonly known as:  TESSALON  Take 1 capsule (200 mg total) by mouth 3  (three) times daily as needed for Cough.     chlorthalidone 50 MG tablet  Commonly known as:  HYGROTEN  TAKE ONE TABLET BY MOUTH EVERY DAY IN THE MORNING WITH FOOD     cyclobenzaprine 10 MG tablet  Dose:  10 mg  Commonly known as:  FLEXERIL  Take 1 tablet (10 mg total) by mouth every 8 (eight) hours as needed for Muscle spasms.     fluticasone 50 MCG/ACT nasal spray  Dose:  1 spray  Commonly known as:  FLONASE  1 spray by Nasal route 2 (two) times daily.     furosemide 20 MG tablet  Dose:  40 mg  Commonly known as:  LASIX  For:  Edema, High Blood Pressure Disorder  Take 2 tablets (40 mg total) by mouth daily.     gabapentin 100 MG capsule  Commonly known as:  NEURONTIN  TAKE ONE CAPSULE BY MOUTH EVERY 8 HOURS     glucose blood test strip  Dose:  1 each  Commonly known as:  TRUE METRIX BLOOD GLUCOSE TEST  1 each by Other route 2 (two) times daily.Use as instructed     hydrALAZINE 50 MG tablet  Dose:  50 mg  Commonly known as:  APRESOLINE  Take 1 tablet (50 mg total) by mouth every morning.     insulin glargine 100 UNIT/ML injection pen  Dose:  20 Units  For:  Type 2 Diabetes  Inject 20 Units into the skin nightly.     insulin lispro 100 UNIT/ML injection  Dose:  1-3 Units  Commonly known as:  HumaLOG  Inject 1-3 Units into the skin nightly as needed (High blood glucose. See administration instructions.).     Insulin Pen Needle 31G X 5 MM Misc  INJECT INSULIN DAILY AS DIRECTED     lactobacillus/streptococcus Caps  Dose:  1 capsule  Take 1 capsule by mouth daily.     Linagliptin 5 MG Tabs  Dose:  5 mg  Take 1 tablet (5 mg total) by mouth daily.     metoprolol succinate XL 25 MG 24 hr tablet  Dose:  25 mg  Commonly known as:  TOPROL-XL  Take 1 tablet (25 mg total) by mouth daily.     NIFEdipine 60 MG 24 hr tablet  Commonly known as:  PROCARDIA XL  TAKE 2 TABLETS BY MOUTH DAILY     pantoprazole 40 MG tablet  Commonly known  as:  PROTONIX  TAKE ONE TABLET BY MOUTH EVERY DAY     predniSONE 5 MG tablet  Dose:  5 mg  Commonly  known as:  DELTASONE  Take 1 tablet (5 mg total) by mouth daily.     sodium bicarbonate 650 MG tablet  TAKE 2 TABLETS BY MOUTH TWO TIMES A DAY     valsartan 80 MG tablet  Dose:  80 mg  Commonly known as:  DIOVAN  Take 1 tablet (80 mg total) by mouth daily.          Follow up recommendations:   Follow up:   Follow-up Information     Darlina Sicilian June, DO Follow up.    Specialties:  Internal Medicine, Cardiology  Contact information:  [place] [address]  [phone]                  Results of Labs/imaging:   Labs have been reviewed:   Coagulation Profile:       CBC review:   Recent Labs  Lab 03/20/17  0831 03/19/17  0439 03/18/17  1812   WBC 6.63 5.91 6.74   Hgb 11.3* 11.7* 13.3   Hematocrit 32.9* 34.0* 38.5*   Platelets 161 134* 143   MCV 91.1 91.2 91.4   RDW 12 12 12    Neutrophils  --   --  40.3   Segmented Neutrophils  --  46  --    Lymphocytes Automated  --   --  27.4   Eosinophils Automated  --   --  23.0   Immature Granulocyte  --   --  0.4   Neutrophils Absolute  --   --  2.71   Absolute Immature Granulocyte  --   --  0.03     Chem Review:  Recent Labs  Lab 03/20/17  0831 03/19/17  0439 03/18/17  1812   Sodium 130* 131* 127*   Potassium 3.5 3.4* 3.4*   Chloride 99* 96* 86*   CO2 25 27 31*   BUN 20.1 29.6* 38.1*   Creatinine 1.8* 1.8* 2.3*   Glucose 133* 151* 207*   Calcium 8.8 9.0 9.9   Magnesium  --  1.6  --    Bilirubin, Total  --  0.4 0.4   AST (SGOT)  --  21 25   ALT  --  10 12   Alkaline Phosphatase  --  57 70     Results     Procedure Component Value Units Date/Time    Glucose Whole Blood - POCT [[id number]]  (Abnormal) Collected:  03/20/17 1159     Updated:  03/20/17 1218     POCT - Glucose Whole blood 271 (H) mg/dL     Basic Metabolic Panel [[id number]]  (Abnormal) Collected:  03/20/17 0831    Specimen:  Blood Updated:  03/20/17 0907     Glucose 133 (H) mg/dL      BUN 20.1 mg/dL      Creatinine 1.8 (H) mg/dL      Calcium 8.8 mg/dL      Sodium 130 (L) mEq/L      Potassium 3.5 mEq/L       Chloride 99 (L) mEq/L      CO2 25 mEq/L      Anion Gap 6.0    GFR [[id number]] Collected:  03/20/17 0831     Updated:  03/20/17 0907     EGFR 35.7    CBC without differential [[id number]]  (  Abnormal) Collected:  03/20/17 0831    Specimen:  Blood from Blood Updated:  03/20/17 0854     WBC 6.63 x10 3/uL      Hgb 11.3 (L) g/dL      Hematocrit 32.9 (L) %      Platelets 161 x10 3/uL      RBC 3.61 (L) x10 6/uL      MCV 91.1 fL      MCH 31.3 pg      MCHC 34.3 g/dL      RDW 12 %      MPV 9.5 fL      Nucleated RBC 0.0 /100 WBC      Absolute NRBC 0.00 x10 3/uL     Glucose Whole Blood - POCT [[id number]]  (Abnormal) Collected:  03/20/17 0729     Updated:  03/20/17 0741     POCT - Glucose Whole blood 113 (H) mg/dL     Glucose Whole Blood - POCT [[id number]]  (Abnormal) Collected:  03/19/17 2032     Updated:  03/19/17 2037     POCT - Glucose Whole blood 257 (H) mg/dL     Urine culture [[id number]] Collected:  03/19/17 0103    Specimen:  Urine from Urine, Clean Catch Updated:  03/19/17 2022    Narrative:       UA contains WBCs  Replace urinary catheter prior to obtaining the urine culture  if it has been in place for greater than or equal to 14  days:->N/A No Foley  Indications for Urine Culture:->Other (please specify in  Comments)    Glucose Whole Blood - POCT [[id number]]  (Abnormal) Collected:  03/19/17 1652     Updated:  03/19/17 1704     POCT - Glucose Whole blood 226 (H) mg/dL         Radiology reports have been reviewed:  Radiology Results (24 Hour)     ** No results found for the last 24 hours. **        Xr Chest 2 Views    Result Date: 03/19/2017  EXAM: TWO-VIEW CHEST RADIOGRAPH HISTORY: Cough FINDINGS: Posteroanterior and lateral chest radiographs demonstrate clear lungs. There is no pneumothorax or pleural effusion. Again seen is mild elevation of the right hemidiaphragm. Unchanged multi lead left-sided pacemaker device. The heart size and mediastinal contours are within normal limits. Visualized osseous structures are  grossly unremarkable.      Clear chest radiographs Manuela Neptune, MD 03/19/2017 9:47 AM    Xr Chest  Ap Portable    Result Date: 03/18/2017  HISTORY:  Cough. COMPARISON:  01/04/2017 PORTABLE CHEST:  Left-sided dual-lead pacemaker unchanged. The heart is enlarged.  The aorta is atherosclerotic. The pulmonary vascular pattern is unremarkable. There is stable elevation of the right hemidiaphragm. The lungs are clear. There is no effusion. There is no pneumothorax.    Bony structures are unremarkable.       No acute cardiopulmonary disease. Virl Diamond, MD 03/18/2017 6:26 PM    Pathology:   Specimens     None        Pending Lab Results:   Labs/Images to be followed at your PCP office: Unresulted Labs     Procedure . . . Date/Time    Urine culture [[id number]] Collected:  03/19/17 0103    Specimen:  Urine from Urine, Clean Catch Updated:  03/19/17 2022    Narrative:       UA contains WBCs  Replace urinary catheter prior to obtaining the urine  culture  if it has been in place for greater than or equal to 14  days:->N/A No Foley  Indications for Urine Culture:->Other (please specify in  Comments)        Hospitalist:   Signed by: Marquis Buggy Everard Interrante  03/20/2017 2:01 PM  Time spent for discharge: 50 minutes      *This note was generated by the Epic EMR system/ Dragon speech recognition and may contain inherent errors or omissions not intended by the user. Grammatical errors, random word insertions, deletions, pronoun errors and incomplete sentences are occasional consequences of this technology due to software limitations. Not all errors are caught or corrected. If there are questions or concerns about the content of this note or information contained within the body of this dictation they should be addressed directly with the author for clarification

## 2017-03-21 ENCOUNTER — Telehealth: Payer: Self-pay

## 2017-03-21 NOTE — Telephone Encounter (Signed)
Post Acute Discharge Call    Hospital /  ED Discharge Date: [facility] / 03/21/17  Primary Discharge Dx: Hyponatremia  Secondary Dx:   Hospital / ED Discharge Appt with PCP: NONE       Placed call to patient to follow up on recent hospital discharge, assess current status, address questions/concerns regarding discharge instructions / medications and encourage patient to schedule Hospital Discharge follow up appt with PCP; No Answer; LVMM with contact information and request for return call.  Will continue to follow.

## 2017-03-22 NOTE — Telephone Encounter (Signed)
Post Acute Discharge Call    Hospital /  ED Discharge Date: [facility] / 03/20/17  Primary Discharge Dx: Hyponatremia  Secondary Dx:   Hospital / ED Discharge Appt with PCP: 03/28/17 with Dr. Herbert Pun       Placed call to patient to follow up on recent hospital discharge, assess current status, address questions/concerns regarding discharge instructions / medications and encourage patient to schedule Hospital Discharge follow up appt with PCP; No Answer; LVMM with contact information and request for return call.

## 2017-03-22 NOTE — Progress Notes (Signed)
03/22/17 1947   Discharge Disposition   Physical Discharge Disposition Home

## 2017-03-26 ENCOUNTER — Other Ambulatory Visit (INDEPENDENT_AMBULATORY_CARE_PROVIDER_SITE_OTHER): Payer: Self-pay | Admitting: Cardiovascular Disease

## 2017-03-26 DIAGNOSIS — M353 Polymyalgia rheumatica: Secondary | ICD-10-CM

## 2017-03-26 NOTE — Telephone Encounter (Signed)
LOV 12/01/16

## 2017-03-27 MED ORDER — PREDNISONE 5 MG PO TABS
5.00 mg | ORAL_TABLET | Freq: Every day | ORAL | 1 refills | Status: DC
Start: 2017-03-27 — End: 2017-07-04

## 2017-03-28 ENCOUNTER — Encounter (INDEPENDENT_AMBULATORY_CARE_PROVIDER_SITE_OTHER): Payer: Self-pay | Admitting: Family Medicine

## 2017-03-28 ENCOUNTER — Ambulatory Visit (FREE_STANDING_LABORATORY_FACILITY): Payer: Medicare Other | Admitting: Family Medicine

## 2017-03-28 VITALS — BP 152/75 | HR 72 | Temp 98.1°F | Ht 60.0 in | Wt 165.0 lb

## 2017-03-28 DIAGNOSIS — N390 Urinary tract infection, site not specified: Secondary | ICD-10-CM

## 2017-03-28 DIAGNOSIS — N184 Chronic kidney disease, stage 4 (severe): Secondary | ICD-10-CM

## 2017-03-28 DIAGNOSIS — E871 Hypo-osmolality and hyponatremia: Secondary | ICD-10-CM

## 2017-03-28 DIAGNOSIS — Z794 Long term (current) use of insulin: Secondary | ICD-10-CM

## 2017-03-28 DIAGNOSIS — I509 Heart failure, unspecified: Secondary | ICD-10-CM

## 2017-03-28 DIAGNOSIS — E118 Type 2 diabetes mellitus with unspecified complications: Secondary | ICD-10-CM

## 2017-03-28 DIAGNOSIS — E782 Mixed hyperlipidemia: Secondary | ICD-10-CM

## 2017-03-28 DIAGNOSIS — L299 Pruritus, unspecified: Secondary | ICD-10-CM

## 2017-03-28 DIAGNOSIS — I10 Essential (primary) hypertension: Secondary | ICD-10-CM

## 2017-03-28 LAB — COMPREHENSIVE METABOLIC PANEL
ALT: 13 U/L (ref 0–55)
AST (SGOT): 25 U/L (ref 5–34)
Albumin/Globulin Ratio: 1.3 (ref 0.9–2.2)
Albumin: 3.8 g/dL (ref 3.5–5.0)
Alkaline Phosphatase: 59 U/L (ref 38–106)
BUN: 33 mg/dL — ABNORMAL HIGH (ref 9.0–28.0)
Bilirubin, Total: 0.6 mg/dL (ref 0.2–1.2)
CO2: 23 mEq/L (ref 21–29)
Calcium: 9.8 mg/dL (ref 7.9–10.2)
Chloride: 105 mEq/L (ref 100–111)
Creatinine: 1.7 mg/dL — ABNORMAL HIGH (ref 0.5–1.5)
Globulin: 2.9 g/dL (ref 2.0–3.7)
Glucose: 123 mg/dL — ABNORMAL HIGH (ref 70–100)
Potassium: 4.4 mEq/L (ref 3.5–5.1)
Protein, Total: 6.7 g/dL (ref 6.0–8.3)
Sodium: 138 mEq/L (ref 136–145)

## 2017-03-28 LAB — CBC AND DIFFERENTIAL
Absolute NRBC: 0 10*3/uL
Basophils Absolute Automated: 0.09 10*3/uL (ref 0.00–0.20)
Basophils Automated: 1.3 %
Eosinophils Absolute Automated: 0.96 10*3/uL — ABNORMAL HIGH (ref 0.00–0.70)
Eosinophils Automated: 14.4 %
Hematocrit: 38.3 % — ABNORMAL LOW (ref 42.0–52.0)
Hgb: 12.4 g/dL — ABNORMAL LOW (ref 13.0–17.0)
Immature Granulocytes Absolute: 0.02 10*3/uL
Immature Granulocytes: 0.3 %
Lymphocytes Absolute Automated: 1.85 10*3/uL (ref 0.50–4.40)
Lymphocytes Automated: 27.7 %
MCH: 30.7 pg (ref 28.0–32.0)
MCHC: 32.4 g/dL (ref 32.0–36.0)
MCV: 94.8 fL (ref 80.0–100.0)
MPV: 10.1 fL (ref 9.4–12.3)
Monocytes Absolute Automated: 0.64 10*3/uL (ref 0.00–1.20)
Monocytes: 9.6 %
Neutrophils Absolute: 3.11 10*3/uL (ref 1.80–8.10)
Neutrophils: 46.7 %
Nucleated RBC: 0 /100 WBC (ref 0.0–1.0)
Platelets: 243 10*3/uL (ref 140–400)
RBC: 4.04 10*6/uL — ABNORMAL LOW (ref 4.70–6.00)
RDW: 12 % (ref 12–15)
WBC: 6.67 10*3/uL (ref 3.50–10.80)

## 2017-03-28 LAB — GFR: EGFR: 38.2

## 2017-03-28 LAB — HEMOLYSIS INDEX: Hemolysis Index: 10 (ref 0–18)

## 2017-03-28 NOTE — Progress Notes (Signed)
Have you seen any specialists/other providers since your last visit with [place]?    Yes- hospital stay    Arm preference verified?   Yes    The patient is due for eye exam, influenza vaccine, shingles vaccine and pneumonia vaccine

## 2017-03-28 NOTE — Progress Notes (Signed)
Subjective:      Date: 03/28/2017 10:32 AM   Patient ID: Marco Bruce is a 83 y.o. male.    Chief Complaint:  Chief Complaint   Patient presents with   . Urinary Tract Infection Symptoms     f/u hospital stay overnight for UTI and weakness, grandson reports patient is feeling better       HPI:  83 yr old male in for hosp fu  In with Grandson - Marco Bruce who translated entire visit  Pt was lethargic and so took him to hospital    Right now pt has no cough except occasionally  No pain  C/o itchy rash on face - since hosp discharge  He finished his Augmnetin yesterday  No other itch in the body    Normal urination  No pain with urination  No fever  No nausea  No vomiting  Overall feeling good    Not see any other MD since discharge    DM - checking sugar and runs in the 120  No hypoglycemia episode  On Lantus at 20 unit at bedtime  Not on meal time insulin    Has CHD - dias dysfunction  Sees cardio q 3 months to check the pacemaker-seen them last month        Pt was admitted 03/18/17 and discharged 03/20/17  [facility] reviewed  Admitted with Bronchitis - CXR negative for pneumonia  Also possible UTI - cx was done and he was started on Augmentin  Hyponatremia- improving  AKI - sees Nephrology Dr Stephani Police      MYT:RZNBV chest radiographs Manuela Neptune, MD 03/19/2017 9:47 AM    Urine culture:  03/21/17  >100,000 CFU/ML Enterococcus faecalis         If susceptible, Ampicillin or Amoxicillin is the drug of       choice for treatment of Enterococcus sp.       [place] Ant[facility]icrobial [facility] 2017    _____________________________________________________________________________                  E.faecalis    ANTIBIOTICS           MIC INTRP    _____________________________________________________________________________  Ampicillin            1   S     Gentamicin High Level [place] <=500  S     Levofloxacin          <=1  S     Nitrofurantoin          <=16  S D1   Penicillin            4   S     Streptomycin High Level Resist<=1000  S     Tetracycline          <=0.5  S     Vancomycin            1   S         HPI    Problem List:  Patient Active Problem List   Diagnosis   . Stage 4 chronic kidney disease   . Renovascular hypertension   . Hyponatremia   . Essential hypertension   . Hypokalemia   . Mixed hyperlipidemia   . Type 2 diabetes mellitus with complication, with long-term current use of insulin   . Acute cystitis without hematuria       Current Medications:  Current Outpatient Prescriptions   Medication Sig Dispense Refill   .  allopurinol (ZYLOPRIM) 100 MG tablet Take 1 tablet (100 mg total) by mouth daily. 90 tablet 3   . atorvastatin (LIPITOR) 20 MG tablet Take 1 tablet (20 mg total) by mouth daily. 90 tablet 3   . cetirizine (ZYRTEC) 10 MG tablet Take 10 mg by mouth daily.     . chlorthalidone (HYGROTEN) 50 MG tablet TAKE ONE TABLET BY MOUTH EVERY DAY IN THE MORNING WITH FOOD 90 tablet 3   . cyclobenzaprine (FLEXERIL) 10 MG tablet Take 1 tablet (10 mg total) by mouth every 8 (eight) hours as needed for Muscle spasms. 30 tablet 5   . docusate sodium (COLACE) 100 MG capsule Take by mouth.     . fluticasone (FLONASE) 50 MCG/ACT nasal spray 1 spray by Nasal route 2 (two) times daily. 1 Bottle 0   . furosemide (LASIX) 20 MG tablet Take 2 tablets (40 mg total) by mouth daily. 180 tablet 3   . gabapentin (NEURONTIN) 100 MG capsule TAKE ONE CAPSULE BY MOUTH EVERY 8 HOURS 90 capsule 5   . glucose blood (TRUE METRIX BLOOD GLUCOSE TEST) test strip 1 each by Other route 2 (two) times daily.Use as instructed 200 each PRN   . hydrALAZINE (APRESOLINE) 50 MG tablet Take 1 tablet (50 mg total) by mouth every morning. 90 tablet 3   . insulin glargine (LANTUS SOLOSTAR) 100 UNIT/ML injection pen Inject 20 Units into the skin nightly. 9 mL 3   . insulin lispro (HUMALOG) 100 UNIT/ML injection Inject 1-3 Units into  the skin nightly as needed (High blood glucose. See administration instructions.).     . Insulin Pen Needle 31G X 5 MM Misc INJECT INSULIN DAILY AS DIRECTED 100 each 0   . lactobacillus/streptococcus (RISAQUAD) Cap Take 1 capsule by mouth daily. 30 capsule 0   . Linagliptin 5 MG Tab Take 1 tablet (5 mg total) by mouth daily. 90 tablet 3   . metoprolol succinate XL (TOPROL-XL) 25 MG 24 hr tablet Take 1 tablet (25 mg total) by mouth daily. 90 tablet 3   . NIFEdipine (PROCARDIA XL) 60 MG 24 hr tablet TAKE 2 TABLETS BY MOUTH DAILY 90 tablet 3   . pantoprazole (PROTONIX) 40 MG tablet TAKE ONE TABLET BY MOUTH EVERY DAY 90 tablet 3   . predniSONE (DELTASONE) 5 MG tablet Take 1 tablet (5 mg total) by mouth daily. 90 tablet 1   . sodium bicarbonate 650 MG tablet TAKE 2 TABLETS BY MOUTH TWO TIMES A DAY 120 tablet 2   . valsartan (DIOVAN) 80 MG tablet Take 1 tablet (80 mg total) by mouth daily. 90 tablet 3     No current facility-administered medications for this visit.        Allergies:  No Known Allergies    Past Medical History:  Past Medical History:   Diagnosis Date   . Acute diastolic heart failure 62/95/2841   . Acute on chronic diastolic congestive heart failure 08/13/2016   . Acute pulmonary edema 05/26/2015   . ARF (acute renal failure) 05/26/2015   . Chronic diastolic heart failure 32/44/0102   . Chronic gout    . Chronic pain syndrome 07/07/2015   . CKD (chronic kidney disease) stage 4, GFR 15-29 ml/min     2018 baseline creatinine in EPIC is 1.5-2.3.   . Congestive heart failure (CHF)     EF 40% 5/18 echo, on lasix   . Current chronic use of systemic steroids     For Polymyalgia rheumatica   . Essential  hypertension    . Mixed hyperlipidemia    . NSTEMI (non-ST elevated myocardial infarction) 08/13/2016   . Osteoarthritis of both knees    . Peripheral edema 06/16/2015   . Pneumonia due to infectious organism, unspecified laterality, unspecified part of lung 06/25/2015   . Polymyalgia rheumatica     chronic steroids    . 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 Second degree heart block by electrocardiogram (ECG) 04/27/2015   . Type 2 diabetes mellitus with complication, with long-term current use of insulin     12/01/2016 Hemoglobin A1c 8.2%       Past Surgical History:  Past Surgical History:   Procedure Laterality Date   . ABDOMINAL SURGERY     . PACEMAKER Left 2017       Family History:  History reviewed. No pertinent family history.    Social History:  Social History     Social History   . Marital status: Married     Spouse name: N/A   . Number of children: N/A   . Years of education: N/A     Occupational History   . Not on file.     Social History Main Topics   . Smoking status: Never Smoker   . Smokeless tobacco: Never Used   . Alcohol use No      Comment: former alcoholic   . Drug use: No   . Sexual activity: Not on file     Other Topics Concern   . Not on file     Social History Narrative   . No narrative on file       The following sections were reviewed this encounter by the provider:   Tobacco  Allergies  Meds  Problems  Med Hx  Surg Hx  Fam Hx  Soc Hx          Vitals:  BP 152/75 (BP Site: Left arm, Patient Position: Sitting, Cuff Size: Medium)   Pulse 72   Temp 98.1 F (36.7 C) (Oral)   Ht 1.524 m (5')   Wt 74.8 kg (165 lb)   SpO2 97%   BMI 32.22 kg/m       ROS:  General/Constitutional:   Denies Chills. Denies Fatigue. Denies Fever.   Ophthalmologic:   Denies Blurred vision. Denies Eye Pain.   ENT:   Denies Nasal Discharge. Denies Ear pain. Denies Sinus pain.   Endocrine:   Denies Polydipsia. Denies Polyuria.   Respiratory:   occ Cough. Denies Orthopnea. Denies Shortness of breath. Denies Wheezing.   Cardiovascular:   Denies Chest pain. Denies Chest pain with exertion. Denies Leg Claudication. Denies  Palpitations. Denies Swelling in hands/feet.   Gastrointestinal:   Denies Abdominal pain. Denies Blood in stool. Denies Constipation. Denies Diarrhea.  Denies Heartburn. Denies Nausea. Denies Vomiting.    Genitourinary:   Denies Blood in urine. Denies Frequent urination. Denies Painful urination.     Physical Exam:  Physical Exam:  Constitutional: Well-developed, well-nourished. In no acute distress.   Head: Atraumatic. Normocephalic.  Neck: Normal range of motion. Neck supple. No carotid bruits.  Cardiovascular: Normal rate.  Regular rhythm, no S3, normal S1, normal S2, no S4.  No murmur heard. No rubs, thrills, or lifts.   Pulmonary/Chest: Breath sounds normal. No wheezing, rhonchi, crackles, cough.   Abdominal: Bowel sounds are normal. Soft, non-tender, non-distended. No palpable masses. There is no rebound, rigidity, or guarding.   Musculoskeletal: Normal range of motion, + knee tenderness   Neurological: Alert. Awake.  Oriented to person, place, and time.  He walks with a cane due to B/L knee pain.  Extremities: Normal distal pulses. No edema on exam, No clubbing.  No cyanosis.  Skin: Skin is warm and moist. No exanthem    Lab Results   Component Value Date    CHOL 113 (L) 05/31/2006    CHOL 163 01/12/2006    TRIG 125 05/31/2006    TRIG 76 01/12/2006    LDL 45 (L) 05/31/2006    LDL 106 01/12/2006       Lab Results   Component Value Date    HGBA1C 8.2 (A) 12/01/2016    HGBA1C 7.1 (H) 08/13/2016    HGBA1C 9.6 (H) 05/28/2015     Lab Results   Component Value Date    WBC 6.63 03/20/2017    HGB 11.3 (L) 03/20/2017    HCT 32.9 (L) 03/20/2017    PLT 161 03/20/2017    CHOL 113 (L) 05/31/2006    TRIG 125 05/31/2006    LDL 45 (L) 05/31/2006    ALT 10 03/19/2017    AST 21 03/19/2017    NA 130 (L) 03/20/2017    K 3.5 03/20/2017    CL 99 (L) 03/20/2017    CREAT 1.8 (H) 03/20/2017    BUN 20.1 03/20/2017    CO2 25 03/20/2017    TSH 0.51 08/14/2016    PSA 1.0 01/12/2006    INR 1.0 08/13/2016    GLU 133 (H) 03/20/2017    HGBA1C 8.2 (A) 12/01/2016         Assessment/Plan:       1. Hyponatremia  - CBC and differential  - Comprehensive metabolic panel  hosp records reviewed  recheck NA  Should fu with nephro   2. Essential  hypertension  Bp mild high SBP  Continue current med  3. Type 2 diabetes mellitus with complication, with long-term current use of insulin  Should fu with PCP  Last A1c 8.2  On lantus daily  4. Stage 4 chronic kidney disease  - CBC and differential    5. Congestive heart failure, unspecified HF chronicity, unspecified heart failure type  Sees cardiology  CXR was neg at hosp  Normal BNP  Stable s/s  6. Mixed hyperlipidemia  Need lipid check -rtn for 2 weeks fu  7. Itching  On face  No visible rash  Use vaseline or Eucerin on face  If worsening or not better rtn  8. UTI s/s cleared  Cx + E faecalis and sensitive to PCn-he completed augmentin      Alantis Bethune Sidonie Dickens, MD

## 2017-04-07 ENCOUNTER — Other Ambulatory Visit (INDEPENDENT_AMBULATORY_CARE_PROVIDER_SITE_OTHER): Payer: Self-pay | Admitting: Cardiovascular Disease

## 2017-04-07 DIAGNOSIS — I10 Essential (primary) hypertension: Secondary | ICD-10-CM

## 2017-04-07 DIAGNOSIS — M79605 Pain in left leg: Secondary | ICD-10-CM

## 2017-04-07 DIAGNOSIS — M170 Bilateral primary osteoarthritis of knee: Secondary | ICD-10-CM

## 2017-04-07 MED ORDER — GABAPENTIN 100 MG PO CAPS
ORAL_CAPSULE | ORAL | 5 refills | Status: DC
Start: 2017-04-07 — End: 2017-10-04

## 2017-04-07 MED ORDER — NIFEDIPINE ER OSMOTIC RELEASE 60 MG PO TB24
ORAL_TABLET | ORAL | 0 refills | Status: DC
Start: 2017-04-07 — End: 2017-05-07

## 2017-04-07 NOTE — Telephone Encounter (Signed)
LOV with you 12/01/16

## 2017-04-19 ENCOUNTER — Ambulatory Visit (INDEPENDENT_AMBULATORY_CARE_PROVIDER_SITE_OTHER): Payer: Medicare Other | Admitting: Cardiovascular Disease

## 2017-04-29 ENCOUNTER — Emergency Department: Payer: Medicare Other

## 2017-04-29 ENCOUNTER — Emergency Department
Admission: EM | Admit: 2017-04-29 | Discharge: 2017-04-30 | Disposition: A | Discharge status: Home / Self Care | Payer: Medicare Other | Attending: Emergency Medicine | Admitting: Emergency Medicine

## 2017-04-29 DIAGNOSIS — M1A9XX0 Chronic gout, unspecified, without tophus (tophi): Secondary | ICD-10-CM | POA: Insufficient documentation

## 2017-04-29 DIAGNOSIS — R112 Nausea with vomiting, unspecified: Secondary | ICD-10-CM | POA: Insufficient documentation

## 2017-04-29 DIAGNOSIS — Z794 Long term (current) use of insulin: Secondary | ICD-10-CM | POA: Insufficient documentation

## 2017-04-29 DIAGNOSIS — Z950 Presence of cardiac pacemaker: Secondary | ICD-10-CM | POA: Insufficient documentation

## 2017-04-29 DIAGNOSIS — I130 Hypertensive heart and chronic kidney disease with heart failure and stage 1 through stage 4 chronic kidney disease, or unspecified chronic kidney disease: Secondary | ICD-10-CM | POA: Insufficient documentation

## 2017-04-29 DIAGNOSIS — M353 Polymyalgia rheumatica: Secondary | ICD-10-CM | POA: Insufficient documentation

## 2017-04-29 DIAGNOSIS — N184 Chronic kidney disease, stage 4 (severe): Secondary | ICD-10-CM | POA: Insufficient documentation

## 2017-04-29 DIAGNOSIS — Z9049 Acquired absence of other specified parts of digestive tract: Secondary | ICD-10-CM | POA: Insufficient documentation

## 2017-04-29 DIAGNOSIS — R1084 Generalized abdominal pain: Secondary | ICD-10-CM | POA: Insufficient documentation

## 2017-04-29 DIAGNOSIS — N289 Disorder of kidney and ureter, unspecified: Secondary | ICD-10-CM | POA: Insufficient documentation

## 2017-04-29 DIAGNOSIS — E782 Mixed hyperlipidemia: Secondary | ICD-10-CM | POA: Insufficient documentation

## 2017-04-29 DIAGNOSIS — G894 Chronic pain syndrome: Secondary | ICD-10-CM | POA: Insufficient documentation

## 2017-04-29 DIAGNOSIS — Z79899 Other long term (current) drug therapy: Secondary | ICD-10-CM | POA: Insufficient documentation

## 2017-04-29 DIAGNOSIS — I5032 Chronic diastolic (congestive) heart failure: Secondary | ICD-10-CM | POA: Insufficient documentation

## 2017-04-29 DIAGNOSIS — Z7952 Long term (current) use of systemic steroids: Secondary | ICD-10-CM | POA: Insufficient documentation

## 2017-04-29 DIAGNOSIS — E1122 Type 2 diabetes mellitus with diabetic chronic kidney disease: Secondary | ICD-10-CM | POA: Insufficient documentation

## 2017-04-29 LAB — URINALYSIS, REFLEX TO MICROSCOPIC EXAM IF INDICATED
Bilirubin, UA: NEGATIVE
Blood, UA: NEGATIVE
Glucose, UA: NEGATIVE
Ketones UA: NEGATIVE
Leukocyte Esterase, UA: NEGATIVE
Nitrite, UA: NEGATIVE
Protein, UR: 100 — AB
Specific Gravity UA: 1.015 (ref 1.001–1.035)
Urine pH: 6 (ref 5.0–8.0)
Urobilinogen, UA: NORMAL mg/dL

## 2017-04-29 LAB — CBC AND DIFFERENTIAL
Absolute NRBC: 0 10*3/uL
Basophils Absolute Automated: 0.03 10*3/uL (ref 0.00–0.20)
Basophils Automated: 0.5 %
Eosinophils Absolute Automated: 0.76 10*3/uL — ABNORMAL HIGH (ref 0.00–0.70)
Eosinophils Automated: 12.4 %
Hematocrit: 38.9 % — ABNORMAL LOW (ref 42.0–52.0)
Hgb: 13.3 g/dL (ref 13.0–17.0)
Immature Granulocytes Absolute: 0.02 10*3/uL
Immature Granulocytes: 0.3 %
Lymphocytes Absolute Automated: 1.49 10*3/uL (ref 0.50–4.40)
Lymphocytes Automated: 24.3 %
MCH: 31.4 pg (ref 28.0–32.0)
MCHC: 34.2 g/dL (ref 32.0–36.0)
MCV: 91.7 fL (ref 80.0–100.0)
MPV: 9.7 fL (ref 9.4–12.3)
Monocytes Absolute Automated: 1.06 10*3/uL (ref 0.00–1.20)
Monocytes: 17.3 %
Neutrophils Absolute: 2.78 10*3/uL (ref 1.80–8.10)
Neutrophils: 45.2 %
Nucleated RBC: 0 /100 WBC (ref 0.0–1.0)
Platelets: 175 10*3/uL (ref 140–400)
RBC: 4.24 10*6/uL — ABNORMAL LOW (ref 4.70–6.00)
RDW: 13 % (ref 12–15)
WBC: 6.14 10*3/uL (ref 3.50–10.80)

## 2017-04-29 LAB — GFR: EGFR: 28.3

## 2017-04-29 LAB — COMPREHENSIVE METABOLIC PANEL
ALT: 16 U/L (ref 0–55)
AST (SGOT): 38 U/L — ABNORMAL HIGH (ref 5–34)
Albumin/Globulin Ratio: 1.5 (ref 0.9–2.2)
Albumin: 4.1 g/dL (ref 3.5–5.0)
Alkaline Phosphatase: 66 U/L (ref 38–106)
Anion Gap: 11 (ref 5.0–15.0)
BUN: 27.4 mg/dL (ref 9.0–28.0)
Bilirubin, Total: 0.3 mg/dL (ref 0.2–1.2)
CO2: 25 mEq/L (ref 22–29)
Calcium: 10.1 mg/dL (ref 7.9–10.2)
Chloride: 97 mEq/L — ABNORMAL LOW (ref 100–111)
Creatinine: 2.2 mg/dL — ABNORMAL HIGH (ref 0.7–1.3)
Globulin: 2.7 g/dL (ref 2.0–3.6)
Glucose: 125 mg/dL — ABNORMAL HIGH (ref 70–100)
Potassium: 4.3 mEq/L (ref 3.5–5.1)
Protein, Total: 6.8 g/dL (ref 6.0–8.3)
Sodium: 133 mEq/L — ABNORMAL LOW (ref 136–145)

## 2017-04-29 LAB — TROPONIN I: Troponin I: 0.08 ng/mL (ref 0.00–0.09)

## 2017-04-29 MED ORDER — ONDANSETRON HCL 4 MG/2ML IJ SOLN
4.00 mg | Freq: Once | INTRAMUSCULAR | Status: AC
Start: 2017-04-29 — End: 2017-04-29
  Administered 2017-04-29: 18:00:00 4 mg via INTRAVENOUS
  Filled 2017-04-29: qty 2

## 2017-04-29 MED ORDER — SODIUM CHLORIDE 0.9 % IV BOLUS
1000.00 mL | Freq: Once | INTRAVENOUS | Status: AC
Start: 2017-04-29 — End: 2017-04-29
  Administered 2017-04-29: 17:00:00 1000 mL via INTRAVENOUS

## 2017-04-29 MED ORDER — IOHEXOL 240 MG/ML IJ SOLN
50.00 mL | Freq: Once | INTRAMUSCULAR | Status: AC
Start: 2017-04-29 — End: 2017-04-29
  Administered 2017-04-29: 18:00:00 50 mL via ORAL

## 2017-04-29 NOTE — Discharge Instructions (Signed)
Please rest and avoid aggravating activities.  Please follow-up.  Return to the ER for any concerns.    Abdominal Pain    You have been diagnosed with abdominal (belly) pain. The cause of your pain is not yet known.    Many things can cause abdominal pain. Examples include viral infections and bowel (intestine) spasms. You might need another examination or more tests to find out why you have pain.    At this time, your pain does not seem to be caused by anything dangerous. You do not need surgery. You do not need to stay in the hospital.     Though we don't believe your condition is dangerous right now, it is important to be careful. Sometimes a problem that seems mild can become serious later. This is why it is very important that you return here or go to the nearest Emergency Department unless you are 100% improved.    Return here or go to the nearest Emergency Department, or follow up with your physician in:   12 hours.    Drink only clear liquids such as water, clear broth, sports drinks, or clear caffeine-free soft drinks, like 7-Up or Sprite, for the next:   12 hours.    YOU SHOULD SEEK MEDICAL ATTENTION IMMEDIATELY, EITHER HERE OR AT THE NEAREST EMERGENCY DEPARTMENT, IF ANY OF THE FOLLOWING OCCURS:   Your pain does not go away or gets worse.   You cannot keep fluids down or your vomit is dark green.    You vomit blood or see blood in your stool. Blood might be bright red or dark red. It can also be black and look like tar.   You have a fever (temperature higher than 100.15F / 38C) or shaking chills.   Your skin or eyes look yellow or your urine looks brown.   You have severe diarrhea.               Nausea    You have been seen for nausea.    Nausea is the feeling that you are going to vomit (throw up). Nausea is not a disease. It is a symptom of another problem. For example, nausea, vomiting and diarrhea are symptoms of a stomach virus (the "stomach flu").    You may or may not vomit when  you have nausea.     The nausea itself is not dangerous but it can be very uncomfortable.    We might not be able to find out today what is causing your nausea. It is VERY IMPORTANT to see your doctor who can watch for any serious problems.    There are many treatments for nausea. The medical staff will discuss these with you. Some common medicines used to help with nausea are   Promethazine (Phenergan), prochlorperazine (Compazine), metoclopramide (Reglan), ondansetron (Zofran), and many others.    Follow a clear liquid diet. Drink water, broth, 7-Up, Sprite, or other clear caffeine-free soft drinks or sports drinks until you feel better. This might help the nausea and keep you from vomiting.     If your nausea lasts for longer than a few days, or if you have new symptoms, we STRONGLY RECOMMEND that you go to see your family doctor, specialist or clinic. If you cannot get an appointment or do not have a doctor you can always return here or go to the nearest emergency department to be seen again.    YOU SHOULD SEEK MEDICAL ATTENTION IMMEDIATELY, EITHER HERE OR AT THE  NEAREST EMERGENCY DEPARTMENT, IF ANY OF THE FOLLOWING OCCURS:   You vomit often.   You have abdominal (belly) pain.   You vomit blood or anything that looks like coffee grounds.   You have a headache.   You have any new symptoms or concerns.   You feel more "unwell."               Vomiting    You have been seen for vomiting.    Vomiting (throwing-up) can be caused by many different things. Most of the time the cause IS NOT serious. The doctor feels it is OK for you to go home today.    Common causes of vomiting include the following:   Gastroenteritis (stomach flu), usually with diarrhea.   Other illnesses. Sometimes medical conditions like diabetes, heart problems, headaches, or infections can make someone throw up.    Bowel obstructions (blockages) can cause vomiting and make patients unable to have bowel movements (stool) or  pass gas.   Vomiting can be a symptom of appendicitis, especially if there is also pain in the right lower abdomen (belly).    Sometimes it is hard to find out what is causing the vomiting. Vomiting can be treated with anti-nausea medicines like promethazine (Phenergan), prochlorperazine (Compazine) or ondansetron (Zofran).    Try to drink liquids to avoid dehydration. Don't drink a lot of fluid all at once. Take small sips throughout the day.    YOU SHOULD SEEK MEDICAL ATTENTION IMMEDIATELY, EITHER HERE OR AT THE NEAREST EMERGENCY DEPARTMENT, IF ANY OF THE FOLLOWING OCCURS:   You can't stop vomiting or your vomiting doesn't get better with medication.   You cannot keep liquids down.   You have severe sudden chest or belly pain after vomiting.   You have abdominal pain.

## 2017-04-29 NOTE — ED Provider Notes (Signed)
Physician/Midlevel provider first contact with patient: 04/29/17 1704         History     Chief Complaint   Patient presents with   . Abdominal Pain     84 year old male brought in by family for abdominal pain associated with nausea, vomiting, unable to tolerate solid food, but is able to tolerate liquids.  They report the symptoms began on Wednesday and have persisted.  Patient rates the pain at 10 out of 10 with no radiation.  No fever.  No over-the-counter medications.  They deny any history of same.  The daughter reports she is concerned it may be due to some sort of food poisoning.      The history is provided by the patient (family). The history is limited by a language barrier. A language interpreter was used (family at patient request ).   Abdominal Pain   Pain location:  Generalized  Pain quality: bloating    Pain radiates to:  Does not radiate  Pain severity:  Severe  Onset quality:  Gradual  Timing:  Constant  Progression:  Worsening  Chronicity:  New  Context: suspicious food intake    Relieved by:  None tried  Worsened by:  Eating  Ineffective treatments:  None tried  Associated symptoms: nausea    Associated symptoms: no chest pain, no chills, no constipation, no cough, no diarrhea, no dysuria, no fever, no hematuria, no shortness of breath and no vomiting    Nausea:     Severity:  Moderate    Onset quality:  Gradual    Timing:  Constant    Progression:  Unchanged  Risk factors: being elderly             Past Medical History:   Diagnosis Date   . Acute diastolic heart failure 93/57/0177   . Acute on chronic diastolic congestive heart failure 08/13/2016   . Acute pulmonary edema 05/26/2015   . ARF (acute renal failure) 05/26/2015   . Chronic diastolic heart failure 93/90/3009   . Chronic gout    . Chronic pain syndrome 07/07/2015   . CKD (chronic kidney disease) stage 4, GFR 15-29 ml/min     2018 baseline creatinine in [facility] is 1.5-2.3.   . Congestive heart failure (CHF)     EF 40% 5/18 echo, on lasix   .  Current chronic use of systemic steroids     For Polymyalgia rheumatica   . Essential hypertension    . Mixed hyperlipidemia    . NSTEMI (non-ST elevated myocardial infarction) 08/13/2016   . Osteoarthritis of both knees    . Peripheral edema 06/16/2015   . Pneumonia due to infectious organism, unspecified laterality, unspecified part of lung 06/25/2015   . Polymyalgia rheumatica     chronic steroids   . 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 Second degree heart block by electrocardiogram (ECG) 04/27/2015   . Type 2 diabetes mellitus with complication, with long-term current use of insulin     12/01/2016 Hemoglobin A1c 8.2%       Past Surgical History:   Procedure Laterality Date   . ABDOMINAL SURGERY     . CHOLECYSTECTOMY     . PACEMAKER Left 2017       History reviewed. No pertinent family history.    Social-lives with family, speaks cambodian   Social History   Substance Use Topics   . Smoking status: Never Smoker   . Smokeless tobacco: Never Used   . Alcohol use No  Comment: former alcoholic       .     No Known Allergies    Home Medications     Med List Status:  In Progress Set By: Gloriajean Dell, RN at 04/29/2017  5:15 PM                allopurinol (ZYLOPRIM) 100 MG tablet     Take 1 tablet (100 mg total) by mouth daily.     atorvastatin (LIPITOR) 20 MG tablet     Take 1 tablet (20 mg total) by mouth daily.     chlorthalidone (HYGROTEN) 50 MG tablet     TAKE ONE TABLET BY MOUTH EVERY DAY IN THE MORNING WITH FOOD     cyclobenzaprine (FLEXERIL) 10 MG tablet     Take 1 tablet (10 mg total) by mouth every 8 (eight) hours as needed for Muscle spasms.     docusate sodium (COLACE) 100 MG capsule     Take by mouth.     fluticasone (FLONASE) 50 MCG/ACT nasal spray     1 spray by Nasal route 2 (two) times daily.     furosemide (LASIX) 20 MG tablet     Take 2 tablets (40 mg total) by mouth daily.     gabapentin (NEURONTIN) 100 MG capsule     TAKE ONE CAPSULE BY MOUTH EVERY 8 HOURS     glucose blood (TRUE METRIX  BLOOD GLUCOSE TEST) test strip     1 each by Other route 2 (two) times daily.Use as instructed     hydrALAZINE (APRESOLINE) 50 MG tablet     Take 1 tablet (50 mg total) by mouth every morning.     insulin glargine (LANTUS SOLOSTAR) 100 UNIT/ML injection pen     Inject 20 Units into the skin nightly.     insulin lispro (HUMALOG) 100 UNIT/ML injection     Inject 1-3 Units into the skin nightly as needed (High blood glucose. See administration instructions.).     Insulin Pen Needle 31G X 5 MM Misc     INJECT INSULIN DAILY AS DIRECTED     Linagliptin 5 MG Tab     Take 1 tablet (5 mg total) by mouth daily.     NIFEdipine (PROCARDIA XL) 60 MG 24 hr tablet     TAKE 2 TABLETS BY MOUTH Every morning.     pantoprazole (PROTONIX) 40 MG tablet     TAKE ONE TABLET BY MOUTH EVERY DAY     predniSONE (DELTASONE) 5 MG tablet     Take 1 tablet (5 mg total) by mouth daily.     sodium bicarbonate 650 MG tablet     TAKE 2 TABLETS BY MOUTH TWO TIMES A DAY     valsartan (DIOVAN) 80 MG tablet     Take 1 tablet (80 mg total) by mouth daily.                                         Review of Systems   Constitutional: Negative for chills and fever.   HENT: Negative for congestion and rhinorrhea.    Eyes: Negative for discharge and redness.   Respiratory: Negative for cough and shortness of breath.    Cardiovascular: Negative for chest pain and palpitations.   Gastrointestinal: Positive for abdominal pain and nausea. Negative for constipation, diarrhea and vomiting.   Genitourinary: Negative for dysuria, flank  pain, frequency, hematuria and urgency.   Musculoskeletal: Negative for back pain, gait problem, neck pain and neck stiffness.   Skin: Negative for color change, pallor and wound.   Allergic/Immunologic: Negative for immunocompromised state.   Neurological: Negative for dizziness, syncope, weakness, numbness and headaches.   Hematological: Does not bruise/bleed easily.   Psychiatric/Behavioral: Negative for self-injury. The patient is not  nervous/anxious.        Physical Exam    BP: 152/72, Heart Rate: 77, Temp: 98.2 F (36.8 C), Resp Rate: 22, SpO2: 98 %, Weight: 68 kg    Physical Exam   Constitutional: He is oriented to person, place, and time. He appears well-developed and well-nourished. No distress.   Pt resting comfortably in NAD   HENT:   Head: Normocephalic and atraumatic.   Right Ear: External ear normal.   Left Ear: External ear normal.   Nose: Nose normal.   Mouth/Throat: Oropharynx is clear and moist. No oropharyngeal exudate.   Eyes: Pupils are equal, round, and reactive to light. Conjunctivae are normal. Right eye exhibits no discharge. Left eye exhibits no discharge. No scleral icterus.   Neck: Normal range of motion. Neck supple. No JVD present. No tracheal deviation present.   Cardiovascular: Normal rate and regular rhythm.    Pulmonary/Chest: Effort normal and breath sounds normal. No respiratory distress. He has no wheezes. He has no rales. He exhibits no tenderness.   Abdominal: Soft. Bowel sounds are normal. He exhibits no distension and no mass. There is no tenderness. There is no rebound and no guarding.   Pt abdomen is soft, with diffuse ttp. No rebound. No guarding        Musculoskeletal: Normal range of motion. He exhibits no edema or tenderness.   Neurological: He is alert and oriented to person, place, and time.   Skin: Skin is warm and dry. No rash noted. He is not diaphoretic.   Psychiatric: He has a normal mood and affect. His behavior is normal. Judgment and thought content normal.   Nursing note and vitals reviewed.        MDM and ED Course     ED Medication Orders     Start Ordered     Status Ordering Provider    04/29/17 1748 04/29/17 1747  ondansetron (ZOFRAN) injection 4 mg  Once     Route: Intravenous  Ordered Dose: 4 mg     Last MAR action:  Given CONCAUGH-GRUENDEL, Irelynd Zumstein ELIZABETH    04/29/17 1740 04/29/17 1739  iohexol (OMNIPAQUE) 240 MG/ML solution 50 mL  Once     Route: Oral  Ordered Dose: 50 mL      Last MAR action:  Imaging Agent Given CONCAUGH-GRUENDEL, Amaka Gluth ELIZABETH    04/29/17 1716 04/29/17 1715  sodium chloride 0.9 % bolus 1,000 mL  Once     Route: Intravenous  Ordered Dose: 1,000 mL     Last MAR action:  New Bag CONCAUGH-GRUENDEL, [place]             MDM  Number of Diagnoses or Management Options  Generalized abdominal pain:   Non-intractable vomiting with nausea, unspecified vomiting type:   Renal insufficiency:   Diagnosis management comments: I, Domenic Polite, PA-C, have been the primary provider for Marco Bruce during this Emergency Dept visit. The attending signature signifies review and agreement of the history, physical exam, evaluation, clinical impression and plan except as noted.   I have reviewed the nursing notes, including Past medical and surgical,Family and Social  History     Oxygen Saturation by Pulse Oximetry  is 95%-100% - normal, no interventions needed     DDX: Abdominal pain, small bowel obstruction, gastritis, gastroenteritis, nausea, vomiting    Labs reviewed by me, urine with no evidence of infection, influenza is negative, complete blood count with hematocrit 70 decreased at 38.9, creatinine increased at 2.2, sodium decreased to 133    Multiple reassessments of the patient. Pt resting comfortably in NAD. Pt with relief after meds.  Pt with no vomiting while  the ER.  Patient is tolerating by mouth without difficulty.  The patient reports all symptoms are resolved and families request to take the patient home Discussed with patient need for rest, avoid aggravating activities and follow-up. Return to the ER for any concerns. Pt voices understanding. No questions.            Amount and/or Complexity of Data Reviewed  Clinical lab tests: ordered and reviewed  Tests in the radiology section of CPT: ordered and reviewed  Discuss the patient with other providers: yes  Independent visualization of images, tracings, or specimens: yes    Risk of Complications,  Morbidity, and/or Mortality  Presenting problems: moderate  Management options: moderate    Patient Progress  Patient progress: stable                   Procedures  Results     Procedure Component Value Units Date/Time    UA with reflex to micro (pts  3 + yrs) [[id number]]  (Abnormal) Collected:  04/29/17 1837    Specimen:  Urine Updated:  04/29/17 1848     Urine Type Clean Catch     Color, UA Yellow     Clarity, UA Clear     Specific Gravity UA 1.015     Urine pH 6.0     Leukocyte Esterase, UA Negative     Nitrite, UA Negative     Protein, UR 100 (A)     Glucose, UA Negative     Ketones UA Negative     Urobilinogen, UA Normal mg/dL      Bilirubin, UA Negative     Blood, UA Negative     RBC, UA 0-2 /hpf      WBC, UA 0-5 /hpf      Squamous Epithelial Cells, Urine 0-5 /hpf     Rapid influenza A/B antigens [[id number]] Collected:  04/29/17 1728    Specimen:  Nasopharyngeal from Nasal Aspirate Updated:  04/29/17 1759    Narrative:       ORDER#: [id number]                                    ORDERED BY: CONCAUGH-GRUEND  SOURCE: Nasal Aspirate                               COLLECTED:  04/29/17 17:28  ANTIBIOTICS AT COLL.:                                RECEIVED :  04/29/17 17:32  Influenza Rapid Antigen A&B                FINAL       04/29/17 17:59  04/29/17   Negative for Influenza A and B  Reference Range: Negative      Troponin I [[id number]] Collected:  04/29/17 1719    Specimen:  Blood Updated:  04/29/17 1750     Troponin I 0.08 ng/mL     Comprehensive metabolic panel [[id number]]  (Abnormal) Collected:  04/29/17 1719    Specimen:  Blood Updated:  04/29/17 1746     Glucose 125 (H) mg/dL      BUN 27.4 mg/dL      Creatinine 2.2 (H) mg/dL      Sodium 133 (L) mEq/L      Potassium 4.3 mEq/L      Chloride 97 (L) mEq/L      CO2 25 mEq/L      Calcium 10.1 mg/dL      Protein, Total 6.8 g/dL      Albumin 4.1 g/dL      AST (SGOT) 38 (H) U/L      ALT 16 U/L      Alkaline Phosphatase 66 U/L      Bilirubin, Total 0.3 mg/dL       Globulin 2.7 g/dL      Albumin/Globulin Ratio 1.5     Anion Gap 11.0    GFR [[id number]] Collected:  04/29/17 1719     Updated:  04/29/17 1746     EGFR 28.3    CBC with differential [[id number]]  (Abnormal) Collected:  04/29/17 1719    Specimen:  Blood from Blood Updated:  04/29/17 1729     WBC 6.14 x10 3/uL      Hgb 13.3 g/dL      Hematocrit 38.9 (L) %      Platelets 175 x10 3/uL      RBC 4.24 (L) x10 6/uL      MCV 91.7 fL      MCH 31.4 pg      MCHC 34.2 g/dL      RDW 13 %      MPV 9.7 fL      Neutrophils 45.2 %      Lymphocytes Automated 24.3 %      Monocytes 17.3 %      Eosinophils Automated 12.4 %      Basophils Automated 0.5 %      Immature Granulocyte 0.3 %      Nucleated RBC 0.0 /100 WBC      Neutrophils Absolute 2.78 x10 3/uL      Abs Lymph Automated 1.49 x10 3/uL      Abs Mono Automated 1.06 x10 3/uL      Abs Eos Automated 0.76 (H) x10 3/uL      Absolute Baso Automated 0.03 x10 3/uL      Absolute Immature Granulocyte 0.02 x10 3/uL      Absolute NRBC 0.00 x10 3/uL           Radiology Results (24 Hour)     Procedure Component Value Units Date/Time    US Abdomen Limited RUQ [[id number]] Collected:  04/29/17 2131    Order Status:  Completed Updated:  04/29/17 2136    Narrative:       Clinical history: Right upper quadrant pain.    Findings: Directed sonography of the right upper quadrant was performed.      No biliary dilatation. Common bile duct measures 6-7 mm. Gallbladder has  been removed.     No focal lesions are identified in the liver, visualized portions of the  pancreas or right kidney. No right renal calculi or right  hydronephrosis. Right kidney measures 9.6 cm.  No ascites. Visualized portions of the abdominal aorta and IVC are  unremarkable.      Impression:        Status post cholecystectomy. Otherwise, unremarkable study.    Darnelle Bos, MD   04/29/2017 9:32 PM    CT Abd/Pelvis with PO Contrast [[id number]] Collected:  04/29/17 1926    Order Status:  Completed Updated:  04/29/17 1936     Narrative:       Clinical History: Abdominal pain and vomiting.    Technique: Axial images were obtained through the abdomen and pelvis  without intravenous contrast. Oral contrast wasn't measured.    The following dose reduction techniques were utilized: Automated  exposure control and/or adjustment of the mA and/or kV according to  patient size, and the use of iterative reconstruction technique.    Findings: Compared with prior studies including 08/15/2016.    No biliary dilatation. Gallbladder has been removed.      No renal calculi or hydronephrosis.     No gross focal lesions are identified in solid abdominal visceral  organs, but evaluation is limited by lack of IV contrast.     No pathologically enlarged lymph nodes, ascites, or abdominal aortic  aneurysm. Prominent multifocal atherosclerotic calcifications.     No bowel dilatation. Mild diffuse urinary bladder wall thickening.  Prostate gland measures 4.2 x 3.2 cm.    Cardiomegaly and coronary artery calcifications. Partially visualized  pacemaker wires. Small hiatal hernia. Stable 1.3 cm lipomatous lesion  protruding anterior to the skin in the right anterior hip region.    Multilevel degenerative changes in the spine.      Impression:         1. No evidence of bowel obstruction.  2. Cardiomegaly and coronary artery calcifications.  3. Mild diffuse urinary bladder wall thickening.    Please see above for additional findings.    Darnelle Bos, MD   04/29/2017 7:32 PM          Clinical Impression & Disposition     Clinical Impression  Final diagnoses:   Generalized abdominal pain   Non-intractable vomiting with nausea, unspecified vomiting type   Renal insufficiency        ED Disposition     ED Disposition Condition Date/Time Comment    Discharge  Fri Apr 29, 2017 10:25 PM Marco Bruce discharge to home/self care.    Condition at disposition: Stable           New Prescriptions    No medications on file                 Concaugh-Gruendel, Melina Copa,  [place]  04/30/17 0002

## 2017-05-04 ENCOUNTER — Ambulatory Visit (INDEPENDENT_AMBULATORY_CARE_PROVIDER_SITE_OTHER): Payer: Medicare Other | Admitting: Cardiovascular Disease

## 2017-05-05 ENCOUNTER — Other Ambulatory Visit (INDEPENDENT_AMBULATORY_CARE_PROVIDER_SITE_OTHER): Payer: Self-pay | Admitting: Cardiovascular Disease

## 2017-05-05 DIAGNOSIS — IMO0002 Reserved for concepts with insufficient information to code with codable children: Secondary | ICD-10-CM

## 2017-05-05 DIAGNOSIS — E119 Type 2 diabetes mellitus without complications: Secondary | ICD-10-CM

## 2017-05-05 MED ORDER — INSULIN PEN NEEDLE 31G X 5 MM MISC
1 refills | Status: DC
Start: 2017-05-05 — End: 2017-11-07

## 2017-05-05 NOTE — Telephone Encounter (Signed)
LOV with you 12/01/16    LOV with Dr Herbert Pun 03/28/17

## 2017-05-07 ENCOUNTER — Other Ambulatory Visit (INDEPENDENT_AMBULATORY_CARE_PROVIDER_SITE_OTHER): Payer: Self-pay | Admitting: Cardiovascular Disease

## 2017-05-07 DIAGNOSIS — I10 Essential (primary) hypertension: Secondary | ICD-10-CM

## 2017-05-07 NOTE — Telephone Encounter (Signed)
LOV 12/01/16

## 2017-05-08 MED ORDER — NIFEDIPINE ER OSMOTIC RELEASE 60 MG PO TB24
ORAL_TABLET | ORAL | 0 refills | Status: DC
Start: 2017-05-08 — End: 2017-08-05

## 2017-05-08 NOTE — Telephone Encounter (Signed)
Please contact Mr. Pierpoint Daughter and ask her to schedule a F/U appointment for her father with me.    I sent in a refill of the nifedipine (Procardia XL) 60 mg/tablet 2 tablets PO every morning.  #180 tablets.  No refills.     He will need to return for an office visit in order to have refills of this medication.    Thank you.    Jennette Banker, DO

## 2017-05-11 NOTE — Telephone Encounter (Signed)
Patient has appointment 05/18/17

## 2017-05-18 ENCOUNTER — Ambulatory Visit (INDEPENDENT_AMBULATORY_CARE_PROVIDER_SITE_OTHER): Payer: Medicare Other | Admitting: Cardiovascular Disease

## 2017-05-28 ENCOUNTER — Ambulatory Visit
Admission: RE | Admit: 2017-05-28 | Discharge: 2017-05-28 | Disposition: A | Discharge status: Home / Self Care | Payer: Medicare Other | Source: Ambulatory Visit | Attending: Registered Nurse | Admitting: Registered Nurse

## 2017-05-28 ENCOUNTER — Other Ambulatory Visit (INDEPENDENT_AMBULATORY_CARE_PROVIDER_SITE_OTHER): Payer: Self-pay | Admitting: Registered Nurse

## 2017-05-28 ENCOUNTER — Ambulatory Visit (INDEPENDENT_AMBULATORY_CARE_PROVIDER_SITE_OTHER): Payer: Medicare Other | Admitting: Registered Nurse

## 2017-05-28 ENCOUNTER — Encounter (INDEPENDENT_AMBULATORY_CARE_PROVIDER_SITE_OTHER): Payer: Self-pay | Admitting: Registered Nurse

## 2017-05-28 VITALS — BP 149/66 | HR 77 | Temp 97.9°F | Resp 12 | Wt 170.0 lb

## 2017-05-28 DIAGNOSIS — R059 Cough, unspecified: Secondary | ICD-10-CM

## 2017-05-28 DIAGNOSIS — R062 Wheezing: Secondary | ICD-10-CM | POA: Insufficient documentation

## 2017-05-28 DIAGNOSIS — R05 Cough: Secondary | ICD-10-CM | POA: Insufficient documentation

## 2017-05-28 MED ORDER — ALBUTEROL SULFATE HFA 108 (90 BASE) MCG/ACT IN AERS
2.00 | INHALATION_SPRAY | Freq: Four times a day (QID) | RESPIRATORY_TRACT | 0 refills | Status: DC | PRN
Start: 2017-05-28 — End: 2017-12-01

## 2017-05-28 NOTE — Progress Notes (Signed)
[facility]    PROGRESS NOTE      Patient: Marco Bruce   Date: 05/28/2017   MRN: [id number]     Past Medical History:   Diagnosis Date   . Acute diastolic heart failure 97/58/8325   . Acute on chronic diastolic congestive heart failure 08/13/2016   . Acute pulmonary edema 05/26/2015   . ARF (acute renal failure) 05/26/2015   . Chronic diastolic heart failure 49/82/6415   . Chronic gout    . Chronic pain syndrome 07/07/2015   . CKD (chronic kidney disease) stage 4, GFR 15-29 ml/min     2018 baseline creatinine in EPIC is 1.5-2.3.   . Congestive heart failure (CHF)     EF 40% 5/18 echo, on lasix   . Current chronic use of systemic steroids     For Polymyalgia rheumatica   . Essential hypertension    . Mixed hyperlipidemia    . NSTEMI (non-ST elevated myocardial infarction) 08/13/2016   . Osteoarthritis of both knees    . Peripheral edema 06/16/2015   . Pneumonia due to infectious organism, unspecified laterality, unspecified part of lung 06/25/2015   . Polymyalgia rheumatica     chronic steroids   . 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 Second degree heart block by electrocardiogram (ECG) 04/27/2015   . Type 2 diabetes mellitus with complication, with long-term current use of insulin     12/01/2016 Hemoglobin A1c 8.2%     Social History     Social History   . Marital status: Married     Spouse name: N/A   . Number of children: N/A   . Years of education: N/A     Occupational History   . Not on file.     Social History Main Topics   . Smoking status: Never Smoker   . Smokeless tobacco: Never Used   . Alcohol use No      Comment: former alcoholic   . Drug use: No   . Sexual activity: Not on file     Other Topics Concern   . Not on file     Social History Narrative   . No narrative on file     History reviewed. No pertinent family history.    ASSESSMENT/PLAN     [name] is a 84 y.o. male    Chief Complaint   Patient presents with   . Cough        1. Cough  - POCT Influenza A/B  - albuterol (PROAIR HFA) 108 (90  Base) MCG/ACT inhaler; Inhale 2 puffs into the lungs every 6 (six) hours as needed for Wheezing.  Dispense: 1 Inhaler; Refill: 0  - X-ray chest PA and lateral    2. Wheezing  - albuterol (PROAIR HFA) 108 (90 Base) MCG/ACT inhaler; Inhale 2 puffs into the lungs every 6 (six) hours as needed for Wheezing.  Dispense: 1 Inhaler; Refill: 0  - X-ray chest PA and lateral    Advise pt, his daughter and his 67 y.o grandson ( who works at elder care), F/U with PCP next week.  go to ER if pt has weakness, dizziness, nausea, or vomiting, they verbalized understanding.      Results for orders placed or performed during the hospital encounter of 04/29/17   CBC with differential   Result Value Ref Range    WBC 6.14 3.50 - 10.80 x10 3/uL    Hgb 13.3 13.0 - 17.0 g/dL    Hematocrit 38.9 (L)  42.0 - 52.0 %    Platelets 175 140 - 400 x10 3/uL    RBC 4.24 (L) 4.70 - 6.00 x10 6/uL    MCV 91.7 80.0 - 100.0 fL    MCH 31.4 28.0 - 32.0 pg    MCHC 34.2 32.0 - 36.0 g/dL    RDW 13 12 - 15 %    MPV 9.7 9.4 - 12.3 fL    Neutrophils 45.2 None %    Lymphocytes Automated 24.3 None %    Monocytes 17.3 None %    Eosinophils Automated 12.4 None %    Basophils Automated 0.5 None %    Immature Granulocyte 0.3 None %    Nucleated RBC 0.0 0.0 - 1.0 /100 WBC    Neutrophils Absolute 2.78 1.80 - 8.10 x10 3/uL    Abs Lymph Automated 1.49 0.50 - 4.40 x10 3/uL    Abs Mono Automated 1.06 0.00 - 1.20 x10 3/uL    Abs Eos Automated 0.76 (H) 0.00 - 0.70 x10 3/uL    Absolute Baso Automated 0.03 0.00 - 0.20 x10 3/uL    Absolute Immature Granulocyte 0.02 0 x10 3/uL    Absolute NRBC 0.00 0 x10 3/uL   Comprehensive metabolic panel   Result Value Ref Range    Glucose 125 (H) 70 - 100 mg/dL    BUN 27.4 9.0 - 28.0 mg/dL    Creatinine 2.2 (H) 0.7 - 1.3 mg/dL    Sodium 133 (L) 136 - 145 mEq/L    Potassium 4.3 3.5 - 5.1 mEq/L    Chloride 97 (L) 100 - 111 mEq/L    CO2 25 22 - 29 mEq/L    Calcium 10.1 7.9 - 10.2 mg/dL    Protein, Total 6.8 6.0 - 8.3 g/dL    Albumin 4.1 3.5 - 5.0  g/dL    AST (SGOT) 38 (H) 5 - 34 U/L    ALT 16 0 - 55 U/L    Alkaline Phosphatase 66 38 - 106 U/L    Bilirubin, Total 0.3 0.2 - 1.2 mg/dL    Globulin 2.7 2.0 - 3.6 g/dL    Albumin/Globulin Ratio 1.5 0.9 - 2.2    Anion Gap 11.0 5.0 - 15.0   UA with reflex to micro (pts  3 + yrs)   Result Value Ref Range    Urine Type Clean Catch     Color, UA Yellow Clear - Yellow    Clarity, UA Clear Clear - Hazy    Specific Gravity UA 1.015 1.001 - 1.035    Urine pH 6.0 5.0 - 8.0    Leukocyte Esterase, UA Negative Negative    Nitrite, UA Negative Negative    Protein, UR 100 (A) Negative    Glucose, UA Negative Negative    Ketones UA Negative Negative    Urobilinogen, UA Normal 0.2 - 2.0 mg/dL    Bilirubin, UA Negative Negative    Blood, UA Negative Negative    RBC, UA 0-2 0 - 5 /hpf    WBC, UA 0-5 0 - 5 /hpf    Squamous Epithelial Cells, Urine 0-5 0 - 25 /hpf   Troponin I   Result Value Ref Range    Troponin I 0.08 0.00 - 0.09 ng/mL   GFR   Result Value Ref Range    EGFR 28.3        Risk & Benefits of the new medication(s) were explained to the patient (and family) who verbalized understanding & agreed to the treatment plan.  Patient (family) encouraged to contact me/clinical staff with any questions/concerns      MEDICATIONS     Current Outpatient Prescriptions   Medication Sig Dispense Refill   . allopurinol (ZYLOPRIM) 100 MG tablet Take 1 tablet (100 mg total) by mouth daily. 90 tablet 3   . atorvastatin (LIPITOR) 20 MG tablet Take 1 tablet (20 mg total) by mouth daily. 90 tablet 3   . chlorthalidone (HYGROTEN) 50 MG tablet TAKE ONE TABLET BY MOUTH EVERY DAY IN THE MORNING WITH FOOD 90 tablet 3   . cyclobenzaprine (FLEXERIL) 10 MG tablet Take 1 tablet (10 mg total) by mouth every 8 (eight) hours as needed for Muscle spasms. 30 tablet 5   . docusate sodium (COLACE) 100 MG capsule Take by mouth.     . fluticasone (FLONASE) 50 MCG/ACT nasal spray 1 spray by Nasal route 2 (two) times daily. 1 Bottle 0   . furosemide (LASIX) 20 MG  tablet Take 2 tablets (40 mg total) by mouth daily. 180 tablet 3   . gabapentin (NEURONTIN) 100 MG capsule TAKE ONE CAPSULE BY MOUTH EVERY 8 HOURS 90 capsule 5   . hydrALAZINE (APRESOLINE) 50 MG tablet Take 1 tablet (50 mg total) by mouth every morning. 90 tablet 3   . insulin glargine (LANTUS SOLOSTAR) 100 UNIT/ML injection pen Inject 20 Units into the skin nightly. 9 mL 3   . NIFEdipine (PROCARDIA XL) 60 MG 24 hr tablet TAKE 2 TABLETS BY MOUTH Every morning. 180 tablet 0   . pantoprazole (PROTONIX) 40 MG tablet TAKE ONE TABLET BY MOUTH EVERY DAY 90 tablet 3   . valsartan (DIOVAN) 80 MG tablet Take 1 tablet (80 mg total) by mouth daily. 90 tablet 3   . albuterol (PROAIR HFA) 108 (90 Base) MCG/ACT inhaler Inhale 2 puffs into the lungs every 6 (six) hours as needed for Wheezing. 1 Inhaler 0   . glucose blood (TRUE METRIX BLOOD GLUCOSE TEST) test strip 1 each by Other route 2 (two) times daily.Use as instructed 200 each PRN   . Insulin Pen Needle 31G X 5 MM Misc INJECT INSULIN DAILY AS DIRECTED 100 each 1   . Linagliptin 5 MG Tab Take 1 tablet (5 mg total) by mouth daily. 90 tablet 3   . predniSONE (DELTASONE) 5 MG tablet Take 1 tablet (5 mg total) by mouth daily. 90 tablet 1   . sodium bicarbonate 650 MG tablet TAKE 2 TABLETS BY MOUTH TWO TIMES A DAY 120 tablet 2     No current facility-administered medications for this visit.        No Known Allergies    SUBJECTIVE     Chief Complaint   Patient presents with   . Cough        Cough   This is a chronic problem. Episode onset: months ago. The problem has been gradually worsening. The problem occurs every few minutes. The cough is productive of sputum. Associated symptoms include postnasal drip and wheezing (Dnaiel Voller out of inhaler). Pertinent negatives include no chest pain, chills, fever, headaches, rash, sore throat or shortness of breath. The symptoms are aggravated by exercise. Treatments tried: patient's family requesting inhaler.       ROS     Review of Systems    Constitutional: Positive for fatigue. Negative for appetite change, chills, diaphoresis and fever.   HENT: Positive for congestion and postnasal drip. Negative for sinus pressure, sneezing, sore throat and trouble swallowing.    Respiratory: Positive for cough (pt  has chronic cough, but seemed getting worse in the past few days) and wheezing (Avree Szczygiel out of inhaler). Negative for chest tightness, shortness of breath and stridor.    Cardiovascular: Negative for chest pain.   Gastrointestinal: Negative for abdominal distention, constipation, diarrhea, nausea ( pt kept down fluid fine) and vomiting.   Musculoskeletal: Gait problem:  walking with cane himself, needs a little help to get up from chair.   Skin: Negative for rash.   Neurological: Negative for dizziness (not acitve dizziy, but seemed tired this morning, needs a littel assistant) and headaches.       The following sections were reviewed this encounter by the provider:   Tobacco  Allergies  Meds  Problems  Med Hx  Surg Hx  Fam Hx  Soc Hx          PHYSICAL EXAM     Vitals:    05/28/17 0948   BP: 149/66   Pulse: 77   Resp: 12   Temp: 97.9 F (36.6 C)   SpO2: 96%   Weight: 77.1 kg (170 lb)       Physical Exam   Constitutional: He is oriented to person, place, and time. He appears well-developed and well-nourished. No distress.   HENT:   Head: Normocephalic.   Right Ear: Tympanic membrane, external ear and ear canal normal.   Left Ear: Tympanic membrane, external ear and ear canal normal.   Nose: Nose normal.   Mouth/Throat: Uvula is midline and mucous membranes are normal. No oropharyngeal exudate, posterior oropharyngeal edema or posterior oropharyngeal erythema.   Eyes: Pupils are equal, round, and reactive to light. Conjunctivae and EOM are normal.   Neck: Normal range of motion. Neck supple.   Cardiovascular: Normal rate, regular rhythm and normal heart sounds.    No murmur heard.  Pulmonary/Chest: Effort normal. No respiratory distress. He has  decreased breath sounds in the right lower field and the left lower field. He has wheezes (fine expiratory wheezing) in the right lower field and the left lower field. He has no rhonchi. He has no rales.   Neurological: He is alert and oriented to person, place, and time.   Nursing note and vitals reviewed.    Ortho Exam  Neurologic Exam     Mental Status   Oriented to person, place, and time.     Cranial Nerves     CN III, IV, VI   Pupils are equal, round, and reactive to light.  Extraocular motions are normal.       PROCEDURE(S)     Procedures        Signed,  Rosalio Macadamia, FNP  05/28/2017

## 2017-06-02 ENCOUNTER — Emergency Department: Payer: Medicare Other

## 2017-06-02 ENCOUNTER — Inpatient Hospital Stay
Admission: EM | Admit: 2017-06-02 | Discharge: 2017-06-10 | DRG: 291 | Disposition: A | Discharge status: Home Health | Payer: Medicare Other | Attending: Internal Medicine | Admitting: Internal Medicine

## 2017-06-02 DIAGNOSIS — T380X5A Adverse effect of glucocorticoids and synthetic analogues, initial encounter: Secondary | ICD-10-CM | POA: Diagnosis present

## 2017-06-02 DIAGNOSIS — M109 Gout, unspecified: Secondary | ICD-10-CM

## 2017-06-02 DIAGNOSIS — E782 Mixed hyperlipidemia: Secondary | ICD-10-CM | POA: Diagnosis present

## 2017-06-02 DIAGNOSIS — Z7952 Long term (current) use of systemic steroids: Secondary | ICD-10-CM

## 2017-06-02 DIAGNOSIS — R509 Fever, unspecified: Secondary | ICD-10-CM

## 2017-06-02 DIAGNOSIS — N183 Chronic kidney disease, stage 3 unspecified: Secondary | ICD-10-CM

## 2017-06-02 DIAGNOSIS — I080 Rheumatic disorders of both mitral and aortic valves: Secondary | ICD-10-CM | POA: Diagnosis present

## 2017-06-02 DIAGNOSIS — E118 Type 2 diabetes mellitus with unspecified complications: Secondary | ICD-10-CM

## 2017-06-02 DIAGNOSIS — F0390 Unspecified dementia without behavioral disturbance: Secondary | ICD-10-CM | POA: Diagnosis present

## 2017-06-02 DIAGNOSIS — M353 Polymyalgia rheumatica: Secondary | ICD-10-CM | POA: Diagnosis present

## 2017-06-02 DIAGNOSIS — I5033 Acute on chronic diastolic (congestive) heart failure: Secondary | ICD-10-CM | POA: Diagnosis present

## 2017-06-02 DIAGNOSIS — G894 Chronic pain syndrome: Secondary | ICD-10-CM | POA: Diagnosis present

## 2017-06-02 DIAGNOSIS — E871 Hypo-osmolality and hyponatremia: Secondary | ICD-10-CM | POA: Diagnosis present

## 2017-06-02 DIAGNOSIS — I130 Hypertensive heart and chronic kidney disease with heart failure and stage 1 through stage 4 chronic kidney disease, or unspecified chronic kidney disease: Principal | ICD-10-CM | POA: Diagnosis present

## 2017-06-02 DIAGNOSIS — Z87891 Personal history of nicotine dependence: Secondary | ICD-10-CM

## 2017-06-02 DIAGNOSIS — M1A9XX0 Chronic gout, unspecified, without tophus (tophi): Secondary | ICD-10-CM | POA: Diagnosis present

## 2017-06-02 DIAGNOSIS — Z79899 Other long term (current) drug therapy: Secondary | ICD-10-CM

## 2017-06-02 DIAGNOSIS — K5900 Constipation, unspecified: Secondary | ICD-10-CM | POA: Diagnosis not present

## 2017-06-02 DIAGNOSIS — I5031 Acute diastolic (congestive) heart failure: Secondary | ICD-10-CM | POA: Diagnosis present

## 2017-06-02 DIAGNOSIS — I10 Essential (primary) hypertension: Secondary | ICD-10-CM

## 2017-06-02 DIAGNOSIS — E1122 Type 2 diabetes mellitus with diabetic chronic kidney disease: Secondary | ICD-10-CM | POA: Diagnosis present

## 2017-06-02 DIAGNOSIS — I252 Old myocardial infarction: Secondary | ICD-10-CM

## 2017-06-02 DIAGNOSIS — E1165 Type 2 diabetes mellitus with hyperglycemia: Secondary | ICD-10-CM

## 2017-06-02 DIAGNOSIS — R531 Weakness: Secondary | ICD-10-CM

## 2017-06-02 DIAGNOSIS — D631 Anemia in chronic kidney disease: Secondary | ICD-10-CM | POA: Diagnosis present

## 2017-06-02 DIAGNOSIS — I5022 Chronic systolic (congestive) heart failure: Secondary | ICD-10-CM

## 2017-06-02 DIAGNOSIS — IMO0001 Reserved for inherently not codable concepts without codable children: Secondary | ICD-10-CM

## 2017-06-02 DIAGNOSIS — Z23 Encounter for immunization: Secondary | ICD-10-CM

## 2017-06-02 DIAGNOSIS — Z950 Presence of cardiac pacemaker: Secondary | ICD-10-CM

## 2017-06-02 DIAGNOSIS — Z794 Long term (current) use of insulin: Secondary | ICD-10-CM

## 2017-06-02 DIAGNOSIS — N184 Chronic kidney disease, stage 4 (severe): Secondary | ICD-10-CM

## 2017-06-02 DIAGNOSIS — N179 Acute kidney failure, unspecified: Secondary | ICD-10-CM

## 2017-06-02 DIAGNOSIS — I2510 Atherosclerotic heart disease of native coronary artery without angina pectoris: Secondary | ICD-10-CM | POA: Diagnosis present

## 2017-06-02 LAB — CBC AND DIFFERENTIAL
Absolute NRBC: 0 10*3/uL
Hematocrit: 32.4 % — ABNORMAL LOW (ref 37.6–49.6)
Hgb: 11.6 g/dL — ABNORMAL LOW (ref 12.5–17.1)
MCH: 31.1 pg (ref 25.1–33.5)
MCHC: 35.8 g/dL (ref 31.5–35.8)
MCV: 86.9 fL (ref 78.0–96.0)
MPV: 9.8 fL (ref 8.9–12.5)
Nucleated RBC: 0 /100 WBC (ref 0.0–1.0)
Platelets: 158 10*3/uL (ref 142–346)
RBC: 3.73 10*6/uL — ABNORMAL LOW (ref 4.20–5.90)
RDW: 13 % (ref 11–15)
WBC: 8.72 10*3/uL (ref 3.10–9.50)

## 2017-06-02 LAB — CK: Creatine Kinase (CK): 156 U/L (ref 47–267)

## 2017-06-02 LAB — COMPREHENSIVE METABOLIC PANEL
ALT: 8 U/L (ref 0–55)
AST (SGOT): 25 U/L (ref 5–34)
Albumin/Globulin Ratio: 1.7 (ref 0.9–2.2)
Albumin: 4 g/dL (ref 3.5–5.0)
Alkaline Phosphatase: 63 U/L (ref 38–106)
Anion Gap: 7 (ref 5.0–15.0)
BUN: 19.1 mg/dL (ref 9.0–28.0)
Bilirubin, Total: 0.3 mg/dL (ref 0.2–1.2)
CO2: 25 mEq/L (ref 22–29)
Calcium: 9.2 mg/dL (ref 7.9–10.2)
Chloride: 90 mEq/L — ABNORMAL LOW (ref 100–111)
Creatinine: 1.6 mg/dL — ABNORMAL HIGH (ref 0.7–1.3)
Globulin: 2.3 g/dL (ref 2.0–3.6)
Glucose: 87 mg/dL (ref 70–100)
Potassium: 4.2 mEq/L (ref 3.5–5.1)
Protein, Total: 6.3 g/dL (ref 6.0–8.3)
Sodium: 122 mEq/L — ABNORMAL LOW (ref 136–145)

## 2017-06-02 LAB — URINALYSIS, REFLEX TO MICROSCOPIC EXAM IF INDICATED
Bilirubin, UA: NEGATIVE
Blood, UA: NEGATIVE
Glucose, UA: NEGATIVE
Ketones UA: NEGATIVE
Leukocyte Esterase, UA: NEGATIVE
Nitrite, UA: NEGATIVE
Protein, UR: NEGATIVE
Specific Gravity UA: 1.013 (ref 1.001–1.035)
Urine pH: 6 (ref 5.0–8.0)
Urobilinogen, UA: NORMAL mg/dL

## 2017-06-02 LAB — MAGNESIUM: Magnesium: 1.8 mg/dL (ref 1.6–2.6)

## 2017-06-02 LAB — GLUCOSE WHOLE BLOOD - POCT
Whole Blood Glucose POCT: 65 mg/dL — ABNORMAL LOW (ref 70–100)
Whole Blood Glucose POCT: 75 mg/dL (ref 70–100)
Whole Blood Glucose POCT: 97 mg/dL (ref 70–100)
Whole Blood Glucose POCT: 125 mg/dL — ABNORMAL HIGH (ref 70–100)

## 2017-06-02 LAB — ECG 12-LEAD
Atrial Rate: 39 {beats}/min
P-R Interval: 142 ms
Q-T Interval: 500 ms
QRS Duration: 162 ms
QTC Calculation (Bezet): 520 ms
R Axis: -76 degrees
T Axis: 87 degrees
Ventricular Rate: 65 {beats}/min

## 2017-06-02 LAB — BASIC METABOLIC PANEL
Anion Gap: 6 (ref 5.0–15.0)
BUN: 17.4 mg/dL (ref 9.0–28.0)
CO2: 23 mEq/L (ref 22–29)
Calcium: 8.7 mg/dL (ref 7.9–10.2)
Chloride: 92 mEq/L — ABNORMAL LOW (ref 100–111)
Creatinine: 1.5 mg/dL — ABNORMAL HIGH (ref 0.7–1.3)
Glucose: 78 mg/dL (ref 70–100)
Potassium: 3.7 mEq/L (ref 3.5–5.1)
Sodium: 121 mEq/L — ABNORMAL LOW (ref 136–145)

## 2017-06-02 LAB — MAN DIFF ONLY
Band Neutrophils Absolute: 0 10*3/uL (ref 0.00–1.00)
Band Neutrophils: 0 %
Basophils Absolute Manual: 0 10*3/uL (ref 0.00–0.20)
Basophils Manual: 0 %
Eosinophils Absolute Manual: 3.66 10*3/uL — ABNORMAL HIGH (ref 0.00–0.70)
Eosinophils Manual: 42 %
Lymphocytes Absolute Manual: 1.31 10*3/uL (ref 0.50–4.40)
Lymphocytes Manual: 15 %
Monocytes Absolute: 0.7 10*3/uL (ref 0.00–1.20)
Monocytes Manual: 8 %
Neutrophils Absolute Manual: 3.05 10*3/uL (ref 1.80–8.10)
Segmented Neutrophils: 35 %

## 2017-06-02 LAB — HEMOLYSIS INDEX: Hemolysis Index: 7 (ref 0–18)

## 2017-06-02 LAB — PROCALCITONIN: Procalcitonin: 0.19 — ABNORMAL HIGH (ref 0.00–0.10)

## 2017-06-02 LAB — VITAMIN B12: Vitamin B-12: 526 pg/mL (ref 211–911)

## 2017-06-02 LAB — SEDIMENTATION RATE: Sed Rate: 28 mm/Hr — ABNORMAL HIGH (ref 0–15)

## 2017-06-02 LAB — GFR
EGFR: 40.9
EGFR: 44.1

## 2017-06-02 LAB — CELL MORPHOLOGY
Cell Morphology: NORMAL
Platelet Estimate: NORMAL

## 2017-06-02 LAB — TROPONIN I
Troponin I: 0.01 ng/mL (ref 0.00–0.09)
Troponin I: 0.02 ng/mL (ref 0.00–0.09)
Troponin I: 0.02 ng/mL (ref 0.00–0.09)

## 2017-06-02 LAB — TSH: TSH: 2.54 u[IU]/mL (ref 0.35–4.94)

## 2017-06-02 LAB — B-TYPE NATRIURETIC PEPTIDE: B-Natriuretic Peptide: 411.1 pg/mL — ABNORMAL HIGH (ref 0.0–100.0)

## 2017-06-02 LAB — C-REACTIVE PROTEIN: C-Reactive Protein: 0.4 mg/dL (ref 0.0–0.8)

## 2017-06-02 LAB — VITAMIN D,25 OH,TOTAL: Vitamin D, 25 OH, Total: 32 ng/mL (ref 30–100)

## 2017-06-02 LAB — PHOSPHORUS: Phosphorus: 3.8 mg/dL (ref 2.3–4.7)

## 2017-06-02 LAB — HEMOGLOBIN A1C
Average Estimated Glucose: 171.4 mg/dL
Hemoglobin A1C: 7.6 % — ABNORMAL HIGH (ref 4.6–5.9)

## 2017-06-02 MED ORDER — SODIUM BICARBONATE 650 MG PO TABS
1300.00 mg | ORAL_TABLET | Freq: Two times a day (BID) | ORAL | Status: DC
Start: 2017-06-02 — End: 2017-06-04
  Administered 2017-06-02 – 2017-06-04 (×4): 1300 mg via ORAL
  Filled 2017-06-02 (×9): qty 2

## 2017-06-02 MED ORDER — INSULIN LISPRO 100 UNIT/ML SC SOLN
1.00 [IU] | Freq: Three times a day (TID) | SUBCUTANEOUS | Status: DC
Start: 2017-06-02 — End: 2017-06-06
  Administered 2017-06-04: 13:00:00 2 [IU] via SUBCUTANEOUS
  Administered 2017-06-05 – 2017-06-06 (×2): 4 [IU] via SUBCUTANEOUS
  Administered 2017-06-06: 12:00:00 5 [IU] via SUBCUTANEOUS
  Filled 2017-06-02: qty 15
  Filled 2017-06-02: qty 6
  Filled 2017-06-02 (×2): qty 12

## 2017-06-02 MED ORDER — DEXTROSE 50 % IV SOLN
12.50 g | INTRAVENOUS | Status: DC | PRN
Start: 2017-06-02 — End: 2017-06-10

## 2017-06-02 MED ORDER — ONDANSETRON HCL 4 MG/2ML IJ SOLN
4.00 mg | Freq: Four times a day (QID) | INTRAMUSCULAR | Status: DC | PRN
Start: 2017-06-02 — End: 2017-06-10
  Administered 2017-06-04: 04:00:00 4 mg via INTRAVENOUS
  Filled 2017-06-02: qty 2

## 2017-06-02 MED ORDER — GABAPENTIN 100 MG PO CAPS
100.00 mg | ORAL_CAPSULE | Freq: Three times a day (TID) | ORAL | Status: DC
Start: 2017-06-02 — End: 2017-06-10
  Administered 2017-06-02 – 2017-06-10 (×24): 100 mg via ORAL
  Filled 2017-06-02 (×31): qty 1

## 2017-06-02 MED ORDER — INSULIN GLARGINE 100 UNIT/ML SC SOLN
10.00 [IU] | Freq: Every evening | SUBCUTANEOUS | Status: DC
Start: 2017-06-02 — End: 2017-06-06
  Administered 2017-06-05: 23:00:00 10 [IU] via SUBCUTANEOUS
  Filled 2017-06-02: qty 10

## 2017-06-02 MED ORDER — NIFEDIPINE ER OSMOTIC RELEASE 30 MG PO TB24
60.00 mg | ORAL_TABLET | Freq: Every evening | ORAL | Status: DC
Start: 2017-06-02 — End: 2017-06-10
  Administered 2017-06-02 – 2017-06-09 (×8): 60 mg via ORAL
  Filled 2017-06-02 (×11): qty 2

## 2017-06-02 MED ORDER — METOPROLOL SUCCINATE ER 25 MG PO TB24
25.00 mg | ORAL_TABLET | Freq: Every day | ORAL | Status: DC
Start: 2017-06-03 — End: 2017-06-10
  Administered 2017-06-03 – 2017-06-10 (×8): 25 mg via ORAL
  Filled 2017-06-02 (×10): qty 1

## 2017-06-02 MED ORDER — ALBUTEROL-IPRATROPIUM 2.5-0.5 (3) MG/3ML IN SOLN
3.00 mL | Freq: Four times a day (QID) | RESPIRATORY_TRACT | Status: DC
Start: 2017-06-02 — End: 2017-06-10
  Administered 2017-06-02 – 2017-06-10 (×32): 3 mL via RESPIRATORY_TRACT
  Filled 2017-06-02 (×33): qty 3

## 2017-06-02 MED ORDER — HYDRALAZINE HCL 50 MG PO TABS
50.00 mg | ORAL_TABLET | Freq: Every morning | ORAL | Status: DC
Start: 2017-06-03 — End: 2017-06-04
  Administered 2017-06-03: 08:00:00 50 mg via ORAL
  Filled 2017-06-02 (×4): qty 1

## 2017-06-02 MED ORDER — SODIUM CHLORIDE 0.9 % IV SOLN
INTRAVENOUS | Status: DC
Start: 2017-06-02 — End: 2017-06-02

## 2017-06-02 MED ORDER — ALLOPURINOL 100 MG PO TABS
100.00 mg | ORAL_TABLET | Freq: Every day | ORAL | Status: DC
Start: 2017-06-03 — End: 2017-06-10
  Administered 2017-06-03 – 2017-06-10 (×8): 100 mg via ORAL
  Filled 2017-06-02 (×10): qty 1

## 2017-06-02 MED ORDER — ALBUTEROL SULFATE HFA 108 (90 BASE) MCG/ACT IN AERS
2.00 | INHALATION_SPRAY | RESPIRATORY_TRACT | Status: DC | PRN
Start: 2017-06-02 — End: 2017-06-10
  Filled 2017-06-02: qty 1

## 2017-06-02 MED ORDER — INFLUENZA VAC SPLIT HIGH-DOSE 0.5 ML IM SUSY
0.50 mL | PREFILLED_SYRINGE | Freq: Once | INTRAMUSCULAR | Status: DC
Start: 2017-06-03 — End: 2017-06-05
  Filled 2017-06-02: qty 0.5

## 2017-06-02 MED ORDER — DEXTROSE 50 % IV SOLN
12.50 g | INTRAVENOUS | Status: DC | PRN
Start: 2017-06-02 — End: 2017-06-03

## 2017-06-02 MED ORDER — ATORVASTATIN CALCIUM 20 MG PO TABS
20.00 mg | ORAL_TABLET | Freq: Every day | ORAL | Status: DC
Start: 2017-06-03 — End: 2017-06-10
  Administered 2017-06-03 – 2017-06-10 (×8): 20 mg via ORAL
  Filled 2017-06-02 (×10): qty 1

## 2017-06-02 MED ORDER — ONDANSETRON 4 MG PO TBDP
4.00 mg | ORAL_TABLET | Freq: Four times a day (QID) | ORAL | Status: DC | PRN
Start: 2017-06-02 — End: 2017-06-10

## 2017-06-02 MED ORDER — INSULIN LISPRO 100 UNIT/ML SC SOLN
1.00 [IU] | Freq: Every evening | SUBCUTANEOUS | Status: DC
Start: 2017-06-02 — End: 2017-06-06
  Administered 2017-06-05: 23:00:00 3 [IU] via SUBCUTANEOUS
  Filled 2017-06-02: qty 9

## 2017-06-02 MED ORDER — ACETAMINOPHEN 650 MG RE SUPP
650.00 mg | RECTAL | Status: DC | PRN
Start: 2017-06-02 — End: 2017-06-10

## 2017-06-02 MED ORDER — FLUTICASONE FUROATE-VILANTEROL 200-25 MCG/INH IN AEPB
1.00 | INHALATION_SPRAY | Freq: Every morning | RESPIRATORY_TRACT | Status: DC
Start: 2017-06-02 — End: 2017-06-10
  Administered 2017-06-02 – 2017-06-10 (×7): 1 via RESPIRATORY_TRACT
  Filled 2017-06-02: qty 14

## 2017-06-02 MED ORDER — GLUCOSE 40 % PO GEL
15.00 g | ORAL | Status: DC | PRN
Start: 2017-06-02 — End: 2017-06-10

## 2017-06-02 MED ORDER — ACETAMINOPHEN 325 MG PO TABS
650.00 mg | ORAL_TABLET | ORAL | Status: DC | PRN
Start: 2017-06-02 — End: 2017-06-10
  Administered 2017-06-02 – 2017-06-06 (×7): 650 mg via ORAL
  Filled 2017-06-02 (×8): qty 2

## 2017-06-02 MED ORDER — GLUCAGON 1 MG IJ SOLR (WRAP)
1.00 mg | INTRAMUSCULAR | Status: DC | PRN
Start: 2017-06-02 — End: 2017-06-03

## 2017-06-02 MED ORDER — FUROSEMIDE 10 MG/ML IJ SOLN
40.00 mg | Freq: Once | INTRAMUSCULAR | Status: AC
Start: 2017-06-02 — End: 2017-06-02
  Administered 2017-06-02: 17:00:00 40 mg via INTRAVENOUS
  Filled 2017-06-02: qty 4

## 2017-06-02 MED ORDER — NALOXONE HCL 0.4 MG/ML IJ SOLN (WRAP)
0.20 mg | INTRAMUSCULAR | Status: DC | PRN
Start: 2017-06-02 — End: 2017-06-10

## 2017-06-02 MED ORDER — GLUCOSE 40 % PO GEL
15.00 g | ORAL | Status: DC | PRN
Start: 2017-06-02 — End: 2017-06-03

## 2017-06-02 MED ORDER — GLUCAGON 1 MG IJ SOLR (WRAP)
1.00 mg | INTRAMUSCULAR | Status: DC | PRN
Start: 2017-06-02 — End: 2017-06-10

## 2017-06-02 MED ORDER — PANTOPRAZOLE SODIUM 40 MG PO TBEC
40.00 mg | DELAYED_RELEASE_TABLET | Freq: Every morning | ORAL | Status: DC
Start: 2017-06-03 — End: 2017-06-10
  Administered 2017-06-03 – 2017-06-10 (×8): 40 mg via ORAL
  Filled 2017-06-02 (×8): qty 1

## 2017-06-02 MED ORDER — VALSARTAN 80 MG PO TABS
80.00 mg | ORAL_TABLET | Freq: Every day | ORAL | Status: DC
Start: 2017-06-03 — End: 2017-06-04
  Administered 2017-06-03 – 2017-06-04 (×2): 80 mg via ORAL
  Filled 2017-06-02 (×4): qty 1

## 2017-06-02 NOTE — Progress Notes (Signed)
[facility].  Progress Note    Assessment:   Hyponatremia   CHF   DM with CKD   Hypertension with CKD   Anemia   CKD III    Plan:   D/C IV fluid   Lasix 40 mg IV daily   Salt and fluid restriction   Serial labs    Queen Slough, MD  Office - [phone]  ++++++++++++++++++++++++++++++++++++++++++++++++++++++++++++++  Subjective:  No new complaints    Medications:  Scheduled Meds:  Current Facility-Administered Medications   Medication Dose Route Frequency   . albuterol-ipratropium  3 mL Nebulization Q6H Coleman   . fluticasone furoate-vilanterol  1 puff Inhalation QAM   . furosemide  40 mg Intravenous Once   . insulin glargine  10 Units Subcutaneous QHS     Continuous Infusions:  . sodium chloride 100 mL/hr at 06/02/17 1402     PRN Meds:acetaminophen **OR** acetaminophen, Nursing communication: Adult Hypoglycemia Treatment Algorithm **AND** dextrose **AND** dextrose **AND** glucagon (rDNA), naloxone, ondansetron **OR** ondansetron    Objective:  Vital signs in last 24 hours:  Temp:  [98.4 F (36.9 C)] 98.4 F (36.9 C)  Heart Rate:  [67-77] 75  Resp Rate:  [15-20] 17  BP: (139-147)/(56-73) 142/56    Intake/Output from yesterday (07:01 - 07:00):  No intake/output data recorded.     Physical Exam:   Gen: WD WN NAD   CV: S1 S2 N RRR   Chest: rales at bases   Ab: ND NT soft no HSM +BS   Ext: 1+ edema    Labs:    Recent Labs  Lab 06/02/17  1155   Glucose 87   BUN 19.1   Creatinine 1.6*   Calcium 9.2   Sodium 122*   Potassium 4.2   Chloride 90*   CO2 25   Albumin 4.0   Phosphorus 3.8   Magnesium 1.8       Recent Labs  Lab 06/02/17  1155   WBC 8.72   Hgb 11.6*   Hematocrit 32.4*   MCV 86.9   MCH 31.1   MCHC 35.8   RDW 13   MPV 9.8   Platelets 158

## 2017-06-02 NOTE — ED Provider Notes (Signed)
I, Desmond Dike MD, have discussed with the APP, and personally seen and examined this patient, and have fully participated in their care.    Brief HPI: fatigue, R hip pain this week.  Denies trauma/cp/sob.        PHYSICAL EXAM    Heent: perrl, eomi, op clear  Musc:  From, nvi      I/P:     Results     Procedure Component Value Units Date/Time    B-type Natriuretic Peptide [[id number]]  (Abnormal) Collected:  06/02/17 1155    Specimen:  Blood Updated:  06/02/17 1249     B-Natriuretic Peptide 411.1 (H) pg/mL     Manual Differential [[id number]]  (Abnormal) Collected:  06/02/17 1155     Updated:  06/02/17 1242     Segmented Neutrophils 35 %      Band Neutrophils 0 %      Lymphocytes Manual 15 %      Monocytes Manual 8 %      Eosinophils Manual 42 %      Basophils Manual 0 %      Abs Seg Manual 3.05 x10 3/uL      Bands Absolute 0.00 x10 3/uL      Absolute Lymph Manual 1.31 x10 3/uL      Monocytes Absolute 0.70 x10 3/uL      Absolute Eos Manual 3.66 (H) x10 3/uL      Absolute Baso Manual 0.00 x10 3/uL     Cell MorpHology [[id number]] Collected:  06/02/17 1155     Updated:  06/02/17 1242     Cell Morphology: Normal     Platelet Estimate Normal    CBC with differential [[id number]]  (Abnormal) Collected:  06/02/17 1155    Specimen:  Blood from Blood Updated:  06/02/17 1241     WBC 8.72 x10 3/uL      Hgb 11.6 (L) g/dL      Hematocrit 32.4 (L) %      Platelets 158 x10 3/uL      RBC 3.73 (L) x10 6/uL      MCV 86.9 fL      MCH 31.1 pg      MCHC 35.8 g/dL      RDW 13 %      MPV 9.8 fL      Nucleated RBC 0.0 /100 WBC      Absolute NRBC 0.00 x10 3/uL     Phosphorus [[id number]] Collected:  06/02/17 1155    Specimen:  Blood Updated:  06/02/17 1240     Phosphorus 3.8 mg/dL     Magnesium [[id number]] Collected:  06/02/17 1155    Specimen:  Blood Updated:  06/02/17 1240     Magnesium 1.8 mg/dL     Troponin I [[id number]] Collected:  06/02/17 1155    Specimen:  Blood Updated:  06/02/17 1230     Troponin I 0.01 ng/mL     Comprehensive  metabolic panel [[id number]]  (Abnormal) Collected:  06/02/17 1155    Specimen:  Blood Updated:  06/02/17 1222     Glucose 87 mg/dL      BUN 19.1 mg/dL      Creatinine 1.6 (H) mg/dL      Sodium 122 (L) mEq/L      Potassium 4.2 mEq/L      Chloride 90 (L) mEq/L      CO2 25 mEq/L      Calcium 9.2 mg/dL      Protein, Total 6.3 g/dL  Albumin 4.0 g/dL      AST (SGOT) 25 U/L      ALT 8 U/L      Alkaline Phosphatase 63 U/L      Bilirubin, Total 0.3 mg/dL      Globulin 2.3 g/dL      Albumin/Globulin Ratio 1.7     Anion Gap 7.0    C Reactive Protein [[id number]] Collected:  06/02/17 1155    Specimen:  Blood Updated:  06/02/17 1222     C-Reactive Protein 0.4 mg/dL     GFR [[id number]] Collected:  06/02/17 1155     Updated:  06/02/17 1222     EGFR 40.9    Rapid influenza A/B antigens [[id number]] Collected:  06/02/17 1155    Specimen:  Nasopharyngeal from Nasal Aspirate Updated:  06/02/17 1219    Narrative:       ORDER#: [id number]                                    ORDERED BY: Brynda Rim  SOURCE: Nasal Aspirate                               COLLECTED:  06/02/17 11:55  ANTIBIOTICS AT COLL.:                                RECEIVED :  06/02/17 12:02  Influenza Rapid Antigen A&B                FINAL       06/02/17 12:19  06/02/17   Negative for Influenza A and B             Reference Range: Negative      Sedimentation rate (ESR) [[id number]]  (Abnormal) Collected:  06/02/17 1155    Specimen:  Blood Updated:  06/02/17 1218     Sed Rate 28 (H) mm/Hr         Radiology Results (24 Hour)     Procedure Component Value Units Date/Time    XR Chest 2 Views [[id number]] Collected:  06/02/17 1300    Order Status:  Completed Updated:  06/02/17 1305    Narrative:       Clinical History: Generalized weakness.    Findings: AP and lateral views of the chest. Compared with prior studies  including 05/28/2017.    Calcification and tortuosity of the thoracic aorta. Dual-lead pacemaker  wires again overlie the heart. Stable prominent heart, magnified  by AP  technique. Pulmonary vascularity is normal.     Stable elevation of the right hemidiaphragm. No focal consolidation or  pleural effusion.    Evaluation is somewhat limited due to patient body habitus, mild lung  hypoinflation, and AP technique.      Impression:        Stable prominent heart.    Darnelle Bos, MD   06/02/2017 1:01 PM    Hip Right AP and Lateral with Pelvis [[id number]] Collected:  06/02/17 1257    Order Status:  Completed Updated:  06/02/17 1304    Narrative:       Clinical History: Right hip pain.    Findings: AP view of the pelvis and frogleg lateral view of the right  hip.     Prominent iliac and femoral artery calcifications. Degenerative changes  in  the partially visualized lower lumbosacral spine. 2.9 cm ovoid  density over the proximal soft tissues of the right thigh is suspected  to represent a lipoma that measured approximately 2.5 cm on coronal  reformatted images on the previous CT.    No fracture, dislocation, or significant right hip osteoarthritis is  identified.      Impression:        No evidence of fracture.    Darnelle Bos, MD   06/02/2017 1:00 PM            EKG Interpretation  EKG interpreted independently by Dr. Desmond Dike    Rate: Normal, 65 bpm  Rhythm: A paced V sensed rhythm  Axis: Left  ST-T Segments: nonspec st-t changes  Conduction: No blocks  Impression: atrial paced rhythm, lad, non specific w/o evidence of acute infarct.    No significant change from previous EKG on record.      This note was generated by the Epic EMR system/Dragon speech recognition and may contain inherent errors or omissions not intended by the user. Grammatical errors, random word insertions, deletions, pronoun errors and incomplete sentences are occasional consequences of this technology due to software limitations. Not all errors are caught or corrected. If there are questions or concerns about the content of this note or information contained within the body of this dictation  they should be addressed directly with the author for clarification.    Admitted.       Bedelia Person, MD  06/02/17 1325

## 2017-06-02 NOTE — Progress Notes (Signed)
06/02/17 2123   Abnormal Blood Glucose   Blood Glucose less than 70? Yes   Symptoms of Low Blood Glucose None   Hypoglycemia Interventions Held medication (see comments);15g simple oral carbohydrates (see comments)  (HELD LANTUS )   Low Blood Glucose Precipitating Factors acute illness;other (comment)  (PER family pt has not been eating well )     2123 -Blood glucose 65, Pt awake, alert and asymptomatic.Pt had two orange juice, a yogurt.Pt refused to eat any solid foods from family.   2146 -Blood glucose repeated and 75  2212-Blood glucose repeated per protocol and now 97  2253-Blood glucose repeated and 125.     Dr Normajean Baxter made aware. No new orders at this time, will continue to monitor pt closely.

## 2017-06-02 NOTE — Consults (Signed)
[facility]    Date Time: 06/02/17 3:09 PM  Patient Name: Marco Bruce  Requesting Physician: Melbourne Abts, MD       Reason for Consultation:   Shortness of breath      History:   Marco Bruce is a 84 y.o. male admitted on 06/02/2017.  We have been asked by Melbourne Abts, MD,  to provide cardiac consultation, regarding shortness of breath.  Patient was stable in last OV in Oct 2018.  He came to ED with complaints of shortness of breath and 5lbs weight gain.  Son at bedside and says there has been some dietary indiscretion lately with increased sodium in his diet.  Denies chest pain.  Has swelling in legs.  BNP 400s. Na 122.    Past Medical History:     Past Medical History:   Diagnosis Date   . Acute diastolic heart failure 93/90/3009   . Acute on chronic diastolic congestive heart failure 08/13/2016   . Acute pulmonary edema 05/26/2015   . ARF (acute renal failure) 05/26/2015   . Chronic diastolic heart failure 23/30/0762   . Chronic gout    . Chronic pain syndrome 07/07/2015   . CKD (chronic kidney disease) stage 4, GFR 15-29 ml/min     2018 baseline creatinine in EPIC is 1.5-2.3.   . Congestive heart failure (CHF)     EF 40% 5/18 echo, on lasix   . Current chronic use of systemic steroids     For Polymyalgia rheumatica   . Essential hypertension    . Mixed hyperlipidemia    . NSTEMI (non-ST elevated myocardial infarction) 08/13/2016   . Osteoarthritis of both knees    . Peripheral edema 06/16/2015   . Pneumonia due to infectious organism, unspecified laterality, unspecified part of lung 06/25/2015   . Polymyalgia rheumatica     chronic steroids   . 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 Second degree heart block by electrocardiogram (ECG) 04/27/2015   . Type 2 diabetes mellitus with complication, with long-term current use of insulin     12/01/2016 Hemoglobin A1c 8.2%       Past Surgical History:     Past Surgical History:   Procedure Laterality Date   .  ABDOMINAL SURGERY     . CHOLECYSTECTOMY     . PACEMAKER Left 2017       Family History:   History reviewed. No pertinent family history.    Social History:     Social History     Social History   . Marital status: Widowed     Spouse name: N/A   . Number of children: N/A   . Years of education: N/A     Social History Main Topics   . Smoking status: Never Smoker   . Smokeless tobacco: Never Used   . Alcohol use No      Comment: former alcoholic   . Drug use: No   . Sexual activity: Not on file     Other Topics Concern   . Not on file     Social History Narrative   . No narrative on file       Allergies:   No Known Allergies    Medications:     (Not in a hospital admission)      Current Facility-Administered Medications   Medication Dose Route Frequency Provider Last Rate Last Dose   . 0.9%  NaCl infusion   Intravenous Continuous Melbourne Abts, MD 100  mL/hr at 06/02/17 1402     . acetaminophen (TYLENOL) tablet 650 mg  650 mg Oral Q4H PRN Jerolyn Shin, NP   650 mg at 06/02/17 1402    Or   . acetaminophen (TYLENOL) suppository 650 mg  650 mg Rectal Q4H PRN Jerolyn Shin, NP       . albuterol-ipratropium (DUO-NEB) 2.5-0.5(3) mg/3 mL nebulizer 3 mL  3 mL Nebulization Q6H [place] Schneider, [place], NP   3 mL at 06/02/17 1408   . dextrose (GLUCOSE) 40 % oral gel 15 g of glucose  15 g of glucose Oral PRN Jerolyn Shin, NP        And   . dextrose 50 % bolus 12.5 g  12.5 g Intravenous PRN Jerolyn Shin, NP        And   . glucagon (rDNA) (GLUCAGEN) injection 1 mg  1 mg Intramuscular PRN Jerolyn Shin, NP       . fluticasone furoate-vilanterol (BREO ELLIPTA) 200-25 MCG/INH 1 puff  1 puff Inhalation QAM Jerolyn Shin, NP   1 puff at 06/02/17 1423   . insulin glargine (LANTUS) injection 10 Units  10 Units Subcutaneous QHS Jerolyn Shin, NP       . naloxone [facility]) injection 0.2 mg  0.2 mg Intravenous PRN Jerolyn Shin, NP        . ondansetron (ZOFRAN-ODT) disintegrating tablet 4 mg  4 mg Oral Q6H PRN Jerolyn Shin, NP        Or   . ondansetron [facility]) injection 4 mg  4 mg Intravenous Q6H PRN Jerolyn Shin, NP         Current Outpatient Prescriptions   Medication Sig Dispense Refill   . albuterol (PROAIR HFA) 108 (90 Base) MCG/ACT inhaler Inhale 2 puffs into the lungs every 6 (six) hours as needed for Wheezing. 1 Inhaler 0   . allopurinol (ZYLOPRIM) 100 MG tablet Take 1 tablet (100 mg total) by mouth daily. 90 tablet 3   . atorvastatin (LIPITOR) 20 MG tablet Take 1 tablet (20 mg total) by mouth daily. 90 tablet 3   . chlorthalidone (HYGROTEN) 50 MG tablet TAKE ONE TABLET BY MOUTH EVERY DAY IN THE MORNING WITH FOOD 90 tablet 3   . cyclobenzaprine (FLEXERIL) 10 MG tablet Take 1 tablet (10 mg total) by mouth every 8 (eight) hours as needed for Muscle spasms. 30 tablet 5   . docusate sodium (COLACE) 100 MG capsule Take by mouth.     . fluticasone (FLONASE) 50 MCG/ACT nasal spray 1 spray by Nasal route 2 (two) times daily. 1 Bottle 0   . furosemide (LASIX) 20 MG tablet Take 2 tablets (40 mg total) by mouth daily. 180 tablet 3   . gabapentin (NEURONTIN) 100 MG capsule TAKE ONE CAPSULE BY MOUTH EVERY 8 HOURS 90 capsule 5   . glucose blood (TRUE METRIX BLOOD GLUCOSE TEST) test strip 1 each by Other route 2 (two) times daily.Use as instructed 200 each PRN   . hydrALAZINE (APRESOLINE) 50 MG tablet Take 1 tablet (50 mg total) by mouth every morning. 90 tablet 3   . insulin glargine (LANTUS SOLOSTAR) 100 UNIT/ML injection pen Inject 20 Units into the skin nightly. 9 mL 3   . Insulin Pen Needle 31G X 5 MM Misc INJECT INSULIN DAILY AS DIRECTED 100 each 1   . Linagliptin 5 MG Tab Take 1 tablet (5 mg total) by mouth daily. 90 tablet 3   .  NIFEdipine (PROCARDIA XL) 60 MG 24 hr tablet TAKE 2 TABLETS BY MOUTH Every morning. 180 tablet 0   . pantoprazole (PROTONIX) 40 MG tablet TAKE ONE TABLET BY MOUTH EVERY DAY 90 tablet 3   .  predniSONE (DELTASONE) 5 MG tablet Take 1 tablet (5 mg total) by mouth daily. 90 tablet 1   . sodium bicarbonate 650 MG tablet TAKE 2 TABLETS BY MOUTH TWO TIMES A DAY 120 tablet 2   . valsartan (DIOVAN) 80 MG tablet Take 1 tablet (80 mg total) by mouth daily. 90 tablet 3         Review of Systems:    Comprehensive review of systems including constitutional, eyes, ears, nose, mouth, throat, cardiovascular, GI, GU, musculoskeletal, integumentary, respiratory, neurologic, psychiatric, and endocrine is negative other than what is mentioned already in the history of present illness    Physical Exam:     Vitals:    06/02/17 1428   BP:    Pulse: 75   Resp: 17   Temp:    SpO2: 96%     Temp (24hrs), Avg:98.4 F (36.9 C), Min:98.4 F (36.9 C), Max:98.4 F (36.9 C)      Intake and Output Summary (Last 24 hours) at Date Time  No intake or output data in the 24 hours ending 06/02/17 1509    GENERAL: Patient is in no acute distress   HEENT: No scleral icterus or conjunctival pallor, moist mucous membranes   NECK: No jugular venous distention or thyromegaly, normal carotid upstrokes without bruits   CARDIAC: Normal apical impulse, regular rate and rhythm, with normal S1 and S2, systolic murmur LSB  CHEST: Bibasilar crackles  ABDOMEN: Soft, nontender  EXTREMITIES: 1+ bilateral edema  SKIN: No rash or jaundice   NEUROLOGIC: Alert and oriented to time, place and person, normal mood and affect    MUSCULOSKELETAL: Normal muscle strength and tone.      Labs Reviewed:       Recent Labs  Lab 06/02/17  1155 06/02/17  1154   Creatine Kinase (CK)  --  156   Troponin I 0.01  --                Recent Labs  Lab 06/02/17  1155   Bilirubin, Total 0.3   Protein, Total 6.3   Albumin 4.0   ALT 8   AST (SGOT) 25       Recent Labs  Lab 06/02/17  1155   Magnesium 1.8           Recent Labs  Lab 06/02/17  1155   WBC 8.72   Hgb 11.6*   Hematocrit 32.4*   Platelets 158       Recent Labs  Lab 06/02/17  1155   Sodium 122*   Potassium 4.2   Chloride 90*    CO2 25   BUN 19.1   Creatinine 1.6*   EGFR 40.9   Glucose 87   Calcium 9.2         Radiology   Radiological Procedure reviewed.      chest X-ray  Assessment:     1. Acute on chronic diastolic congestive heart failure.      a. Left ventricular ejection fraction 75% by echocardiogram in 2017, reduced to      40% by echocardiogram in May of 2018.  2. Moderate to severe valvular disease.    a. Moderate to severe mitral valve regurgitation.    b. Mild aortic valve regurgitation.  c. Pulmonary artery pressure 56 mmHg in May 2018.  3. 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  4. Chronic kidney disease.  5. Hypertension.  6. Diabetes.  7. Anemia.  8. Former tobacco use.  9. Hyponatremia    Recommendations:    Diurese with IV lasix   Continue cardiac meds   Daily standing weight and strict I/O.   Was due for outpatient echo and f/u for MR.  If he does not have a quick turn around, will do echo in the hospital.      Signed by: Emeline General, [place]  NP [address] (8am-5pm)  MD [employer] [phone] (8am-5pm)  After hours, non urgent consult line [phone]  After Hours, urgent consults [phone]

## 2017-06-02 NOTE — H&P (Signed)
[facility]  H&P    Patient Info:   Date Time: 06/02/2017  3:00 PM   Patient Name:Marco Bruce   ZWC:[id number]    PCP: Darlina Sicilian June, DO   Admit Date:06/02/2017   Attending Physician:Castillo, Darwin-Dean Ta*      Assessment and Plan:   1. Hyponatremia, 70 in an  84 year old male on chlorthalidone and Lasix on outpatient basis.  The patient reportedly drinks only 32 ounces of water per day.  We will be offering the patient Lasix to pull off some of the fluid that is obviously in his lungs.  Repeat BMP at 1900.  Random urine sodium and urine osmolality are pending.  Daily metabolic panel  Lasix 40 mg IV given in the emergency room.    2.  Mild diastolic heart failure in a patient with previous history of the same.  BNP is 411 .  However, the patient's BNP has been as high as 7000 in the past when in fulminant heart failure.  He is a [facility] patient, Dr. Jessy Oto, consulted   We will continue the patient's metoprolol  Daily weights, strict intake and output.  A cardiogram from May 2018 shows a dilated left ventricle with reduced left ventricular systolic function.  Estimated ejection fraction of 40 percent.  Mild left ventricular hypertrophy.  Aortic valve sclerosis with mild aortic insufficiency.  Dilated aortic root.  Sclerotic mitral valve with moderate to severe mitral regurg.  Moderate pulmonary hypertension.  The patient's son denies dietary indiscretion and tells me that he drinks approximately 32 ounces of water per day.  He is only mild swelling in his bilateral lower extremities, but rales in the bases bilaterally.  It is possible that the patient is drinking or eating more than is being offered to me in this interview.  The sodium of 122, maybe dilutional.  Echocardiogram prn    3. Weakness, likely multi-factorial; hyponatremia, mild heart failure, rule out pneumonia, urinary tract infection, B12, vitamin D deficiency or thyroid dysfunction.  Physical therapy consultation  Case  management consultation    4.  CKD, stage III, BUN and creatinine are stable. Dr. Kate Sable is the patient's nephrologist and has been consulted.  Continue sodium bicarbonate     5.  Cough with wheezing, frequent use of albuterol inhaler without respiratory diagnosis.  The patient has never seen a pulmonologist.  He uses an albuterol 2 puffs every 4 hours.  Symptom of cough came on approximately 1 month ago, not associated with fever.  We will check pro-calcitonin.  Will start Breo  May need pulmonary consultation on an outpatient basis, pulmonary function testing.  Chest x-ray is clear    6.  Permanent pacemaker, placed November 2017 with follow-up from [facility].  The patient's grandson tells me this was placed because the patient had atrial fibrillation, but I cannot find documentation of this anywhere.  Unclear to me why the pacemaker was placed.    7.  Hypertension, chronic, on outpatient nifedipine, Diovan, chlorthalidone, Lasix and hydralazine.  The patient is being diuresed with IV Lasix so, chlorthalidone and Lasix by mouth are on hold for now.     8.  Type 2 diabetes mellitus on outpatient Linagliptin and Lantus.  The patient takes 20 of Lantus each evening.  We will reduce this to 20 units as we will have the patient on a strict diabetic diet.  Low-dose insulin sliding scale with before meals and at bedtime blood sugars.  Hemaglobin A1c is pending.  9.  History of NSTEMI, cared for by [facility]    10.  Chronic, normocytic anemia, stable at 11.6 and 32.4.    11.  Gout, chronic on outpatient allopurinol, will continue, no active flare.     12.  Polymyalgia rheumatica, on outpatient prednisone, we will continue.  Sedimentation rate is mildly elevated.  This may be the source of the patient's pain and associated weakness along with difficulty walking.    Right hip x-ray is negative for acute findings.     Hospital Problems:  Active Problems:    Hyponatremia     DVT Prohylaxis:SEDs     Code Status:  Full Code   Disposition:home   Condition on admission and Prognosis: stable   Type of Admission:Inpatient   Estimated Length of Stay (including stay in the ER receiving treatment): greater than 2 midnights   Medical Necessity for stay: mild diastolic heart failure, weakness, hyponatremia       Clinical Presentation   History of Presenting Illness:   Marco Bruce is a 84 y.o. male who has history of Past Surgical History:   Procedure Laterality Date   . ABDOMINAL SURGERY     . CHOLECYSTECTOMY     . PACEMAKER Left 2017      Past Medical History:   Diagnosis Date   . Acute diastolic heart failure 54/98/2641   . Acute on chronic diastolic congestive heart failure 08/13/2016   . Acute pulmonary edema 05/26/2015   . ARF (acute renal failure) 05/26/2015   . Chronic diastolic heart failure 58/30/9407   . Chronic gout    . Chronic pain syndrome 07/07/2015   . CKD (chronic kidney disease) stage 4, GFR 15-29 ml/min     2018 baseline creatinine in EPIC is 1.5-2.3.   . Congestive heart failure (CHF)     EF 40% 5/18 echo, on lasix   . Current chronic use of systemic steroids     For Polymyalgia rheumatica   . Essential hypertension    . Mixed hyperlipidemia    . NSTEMI (non-ST elevated myocardial infarction) 08/13/2016   . Osteoarthritis of both knees    . Peripheral edema 06/16/2015   . Pneumonia due to infectious organism, unspecified laterality, unspecified part of lung 06/25/2015   . Polymyalgia rheumatica     chronic steroids   . 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 Second degree heart block by electrocardiogram (ECG) 04/27/2015   . Type 2 diabetes mellitus with complication, with long-term current use of insulin     12/01/2016 Hemoglobin A1c 8.2%    [name] is a very pleasant 84 year old [place] speaking male who presents with complaints of weakness. The patient's grandson is at the bedside who is translating for him.  The patient tells me that he has been feeling poorly for approximately one month with a dry cough.  He has  seen his primary care physician for this, has had outpatient x-rays which appear normal.  He reports wheezing but denies SOB.  Approximately one week ago he developed stiffness from his hip to his knees.  This morning he had difficulty walking.  2 days ago the patient vomited 2 with complaints of nausea.      The patient denies dietary indiscretion and tells me that he drinks about 32 ounces of water per day.  He has not missed any medications and there are no new medications.  Denies headache, dizziness, visual changes, chest pain, shortness of breath, abdominal pain, diarrhea, constipation, or urinary symptoms.  Review of Systems:    Items that are highlighted in BOLD are positive:     Constitutional: negative for anorexia, chills, fatigue, fevers, malaise, night sweats, sweats and weight loss  Eyes: negative for cataracts, color blindness, contacts/glasses, glaucoma, icterus, irritation, redness and visual disturbance, photophobia  Ears, nose, mouth, throat, and face: negative for ear drainage, earaches, epistaxis, facial trauma, hearing loss, hoarseness, nasal congestion, snoring, sore mouth, sore throat, tinnitus and voice change, dysphagia, no hearing devices in use.   Respiratory: negative for asthma, chronic bronchitis, cough, dyspnea on exertion, emphysema, hemoptysis, pleurisy/chest pain, pneumonia, sputum, stridor and wheezing, prior intubations  Cardiovascular: negative for chest pain, chest pressure/discomfort, claudication, dyspnea, exertional chest pressure/discomfort, fatigue, irregular heart beat, lower extremity edema, near-syncope, orthopnea, palpitations, paroxysmal nocturnal dyspnea, exercise intolerance syncope and tachypnea  Gastrointestinal: negative for abdominal pain, change in bowel habits, constipation, diarrhea, dyspepsia, dysphagia, jaundice, melena, nausea, vomiting, odynophagia, reflux symptoms or hematochezia.  Genitourinary: negative for decreased stream, dysuria, frequency,  hematuria, hesitancy, nocturia and urinary incontinence  Hematologic/lymphatic: negative for bleeding, easy bruising, lymphadenopathy and petechiae  Musculoskeletal: knee pain. negative for arthralgias, back pain, bone pain, muscle weakness, myalgias, neck pain and stiff joints  Neurological: negative for coordination problems, dizziness, gait problems, headaches, memory problems, paresthesia, seizures, speech problems, tremors, vertigo and weakness  Behavioral/Psych: negative for abusive relationship, anxiety, depression, excessive alcohol consumption, fatigue, illegal drug usage, mood swings and tobacco use  Endocrine: negative for diabetic symptoms including blurry vision, increased fatigue, polydipsia, polyphagia, polyuria, poor wound healing, pruritus, skin dryness and weight loss and temperature intolerance  Allergic/Immunologic: negative for anaphylaxis, angioedema, hay fever and urticaria       Vitals:   Vitals reviewed height is 1.575 m (5\' 2" ) and weight is 72.6 kg (160 lb). His oral temperature is 98.4 F (36.9 C). His blood pressure is 142/56 and his pulse is 75. His respiration is 17 and oxygen saturation is 96%. Body mass index is 29.26 kg/m.  Vitals:    06/02/17 1330 06/02/17 1400 06/02/17 1422 06/02/17 1428   BP: 139/61 142/56     Pulse: 69 69 67 75   Resp: 15 19 18 17    Temp:       TempSrc:       SpO2: 97% 98% 100% 96%   Weight:       Height:         Intake and Output Summary (Last 24 hours) at Date Time No intake or output data in the 24 hours ending 06/02/17 1500   Physical Exam:     Items that are highlighted in BOLD are positive:     General Appearance:    Alert, cooperative, no distress, appears stated age   Head:    Normocephalic, without obvious abnormality, atraumatic   Eyes:    PERRL, conjunctiva/corneas clear, EOM's intact,   Ears:    Normal  external ear canals bilaterally. No obvious hearing loss.   Nose:   Nares normal, septum midline, mucosa normal, no drainage    or sinus tenderness    Throat:   Lips, mucosa, and tongue normal; teeth and gums normal   Neck:   Supple, symmetrical, trachea midline, no adenopathy;     thyroid:  no enlargement/tenderness/nodules; no carotid    bruit or JVD   Back:     Symmetric, no curvature, ROM normal, no CVA tenderness   Lungs:     Rales in the bases bilaterally. Clear to auscultation bilaterally, respirations unlabored   Chest Wall:  No tenderness or deformity    Heart:    Regular rate and rhythm, S1 and S2 normal, (+) murmur, rub   or gallop   Abdomen:     Soft, non-tender, bowel sounds active all four quadrants,     no masses, no organomegaly   Extremities:   Extremities normal, atraumatic, no cyanosis or mild BLE edema   Pulses:   2+ and symmetric all extremities   Skin:   Skin color, texture, turgor normal, no rashes or lesions   Lymph nodes:   Cervical, supraclavicular, and axillary nodes normal   Neurologic:   CNII-XII intact, normal strength, sensation and reflexes     throughout              Clinical Information   Chief Complaint:  Chief Complaint   Patient presents with   . Hip Pain   . Fever   . Joint Pain      Past Medical History:  Past Medical History:   Diagnosis Date   . Acute diastolic heart failure 71/85/5015   . Acute on chronic diastolic congestive heart failure 08/13/2016   . Acute pulmonary edema 05/26/2015   . ARF (acute renal failure) 05/26/2015   . Chronic diastolic heart failure 86/82/5749   . Chronic gout    . Chronic pain syndrome 07/07/2015   . CKD (chronic kidney disease) stage 4, GFR 15-29 ml/min     2018 baseline creatinine in EPIC is 1.5-2.3.   . Congestive heart failure (CHF)     EF 40% 5/18 echo, on lasix   . Current chronic use of systemic steroids     For Polymyalgia rheumatica   . Essential hypertension    . Mixed hyperlipidemia    . NSTEMI (non-ST elevated myocardial infarction) 08/13/2016   . Osteoarthritis of both knees    . Peripheral edema 06/16/2015   . Pneumonia due to infectious organism, unspecified laterality,  unspecified part of lung 06/25/2015   . Polymyalgia rheumatica     chronic steroids   . 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 Second degree heart block by electrocardiogram (ECG) 04/27/2015   . Type 2 diabetes mellitus with complication, with long-term current use of insulin     12/01/2016 Hemoglobin A1c 8.2%      Past Surgical History:  Past Surgical History:   Procedure Laterality Date   . ABDOMINAL SURGERY     . CHOLECYSTECTOMY     . PACEMAKER Left 2017      Family History:History reviewed. No pertinent family history.   Social History:  History   Alcohol Use No     Comment: former alcoholic     History   Drug Use No     History   Smoking Status   . Never Smoker   Smokeless Tobacco   . Never Used     Social History     Social History   . Marital status: Widowed     Spouse name: N/A   . Number of children: N/A   . Years of education: N/A     Social History Main Topics   . Smoking status: Never Smoker   . Smokeless tobacco: Never Used   . Alcohol use No      Comment: former alcoholic   . Drug use: No   . Sexual activity: Not Asked     Other Topics Concern   . None     Social History Narrative   . None  Allergies:No Known Allergies   Medications:  (Not in a hospital admission)       Results of Labs/imaging   Labs have been reviewed:   Coagulation Profile:        CBC review:   Recent Labs  Lab 06/02/17  1155   WBC 8.72   Hgb 11.6*   Hematocrit 32.4*   Platelets 158   MCV 86.9   RDW 13   Segmented Neutrophils 35      Chem Review:  Recent Labs  Lab 06/02/17  1155   Sodium 122*   Potassium 4.2   Chloride 90*   CO2 25   BUN 19.1   Creatinine 1.6*   Glucose 87   Calcium 9.2   Magnesium 1.8   Phosphorus 3.8   Bilirubin, Total 0.3   AST (SGOT) 25   ALT 8   Alkaline Phosphatase 63      Results     Procedure Component Value Units Date/Time    CREATINE KINASE LEVEL (CK) [[id number]] Collected:  06/02/17 1154    Specimen:  Blood Updated:  06/02/17 1442     Creatine Kinase (CK) 156 U/L     UA with reflex to micro (pts  3 +  yrs) [[id number]] Collected:  06/02/17 1409    Specimen:  Urine Updated:  06/02/17 1434     Urine Type Clean Catch     Color, UA Yellow     Clarity, UA Clear     Specific Gravity UA 1.013     Urine pH 6.0     Leukocyte Esterase, UA Negative     Nitrite, UA Negative     Protein, UR Negative     Glucose, UA Negative     Ketones UA Negative     Urobilinogen, UA Normal mg/dL      Bilirubin, UA Negative     Blood, UA Negative    TSH [[id number]] Collected:  06/02/17 1154    Specimen:  Blood Updated:  06/02/17 1426     TSH 2.54 uIU/mL     Vitamin B12 [[id number]] Collected:  06/02/17 1154    Specimen:  Blood Updated:  06/02/17 1351    Vitamin D,25 OH, Total [[id number]] Collected:  06/02/17 1154     Updated:  06/02/17 1351    B-type Natriuretic Peptide [[id number]]  (Abnormal) Collected:  06/02/17 1155    Specimen:  Blood Updated:  06/02/17 1249     B-Natriuretic Peptide 411.1 (H) pg/mL     Manual Differential [[id number]]  (Abnormal) Collected:  06/02/17 1155     Updated:  06/02/17 1242     Segmented Neutrophils 35 %      Band Neutrophils 0 %      Lymphocytes Manual 15 %      Monocytes Manual 8 %      Eosinophils Manual 42 %      Basophils Manual 0 %      Abs Seg Manual 3.05 x10 3/uL      Bands Absolute 0.00 x10 3/uL      Absolute Lymph Manual 1.31 x10 3/uL      Monocytes Absolute 0.70 x10 3/uL      Absolute Eos Manual 3.66 (H) x10 3/uL      Absolute Baso Manual 0.00 x10 3/uL     Cell MorpHology [[id number]] Collected:  06/02/17 1155     Updated:  06/02/17 1242     Cell Morphology: Normal     Platelet Estimate Normal    CBC with  differential [[id number]]  (Abnormal) Collected:  06/02/17 1155    Specimen:  Blood from Blood Updated:  06/02/17 1241     WBC 8.72 x10 3/uL      Hgb 11.6 (L) g/dL      Hematocrit 32.4 (L) %      Platelets 158 x10 3/uL      RBC 3.73 (L) x10 6/uL      MCV 86.9 fL      MCH 31.1 pg      MCHC 35.8 g/dL      RDW 13 %      MPV 9.8 fL      Nucleated RBC 0.0 /100 WBC      Absolute NRBC 0.00 x10 3/uL      Phosphorus [[id number]] Collected:  06/02/17 1155    Specimen:  Blood Updated:  06/02/17 1240     Phosphorus 3.8 mg/dL     Magnesium [[id number]] Collected:  06/02/17 1155    Specimen:  Blood Updated:  06/02/17 1240     Magnesium 1.8 mg/dL     Troponin I [[id number]] Collected:  06/02/17 1155    Specimen:  Blood Updated:  06/02/17 1230     Troponin I 0.01 ng/mL     Comprehensive metabolic panel [[id number]]  (Abnormal) Collected:  06/02/17 1155    Specimen:  Blood Updated:  06/02/17 1222     Glucose 87 mg/dL      BUN 19.1 mg/dL      Creatinine 1.6 (H) mg/dL      Sodium 122 (L) mEq/L      Potassium 4.2 mEq/L      Chloride 90 (L) mEq/L      CO2 25 mEq/L      Calcium 9.2 mg/dL      Protein, Total 6.3 g/dL      Albumin 4.0 g/dL      AST (SGOT) 25 U/L      ALT 8 U/L      Alkaline Phosphatase 63 U/L      Bilirubin, Total 0.3 mg/dL      Globulin 2.3 g/dL      Albumin/Globulin Ratio 1.7     Anion Gap 7.0    C Reactive Protein [[id number]] Collected:  06/02/17 1155    Specimen:  Blood Updated:  06/02/17 1222     C-Reactive Protein 0.4 mg/dL     GFR [[id number]] Collected:  06/02/17 1155     Updated:  06/02/17 1222     EGFR 40.9    Rapid influenza A/B antigens [[id number]] Collected:  06/02/17 1155    Specimen:  Nasopharyngeal from Nasal Aspirate Updated:  06/02/17 1219    Narrative:       ORDER#: [id number]                                    ORDERED BY: Brynda Rim  SOURCE: Nasal Aspirate                               COLLECTED:  06/02/17 11:55  ANTIBIOTICS AT COLL.:                                RECEIVED :  06/02/17 12:02  Influenza Rapid Antigen A&B  FINAL       06/02/17 12:19  06/02/17   Negative for Influenza A and B             Reference Range: Negative      Sedimentation rate (ESR) [[id number]]  (Abnormal) Collected:  06/02/17 1155    Specimen:  Blood Updated:  06/02/17 1218     Sed Rate 28 (H) mm/Hr          Radiology reports have been reviewed:  Radiology Results (24 Hour)     Procedure Component Value Units  Date/Time    XR Chest 2 Views [[id number]] Collected:  06/02/17 1300    Order Status:  Completed Updated:  06/02/17 1305    Narrative:       Clinical History: Generalized weakness.    Findings: AP and lateral views of the chest. Compared with prior studies  including 05/28/2017.    Calcification and tortuosity of the thoracic aorta. Dual-lead pacemaker  wires again overlie the heart. Stable prominent heart, magnified by AP  technique. Pulmonary vascularity is normal.     Stable elevation of the right hemidiaphragm. No focal consolidation or  pleural effusion.    Evaluation is somewhat limited due to patient body habitus, mild lung  hypoinflation, and AP technique.      Impression:        Stable prominent heart.    Darnelle Bos, MD   06/02/2017 1:01 PM    Hip Right AP and Lateral with Pelvis [[id number]] Collected:  06/02/17 1257    Order Status:  Completed Updated:  06/02/17 1304    Narrative:       Clinical History: Right hip pain.    Findings: AP view of the pelvis and frogleg lateral view of the right  hip.     Prominent iliac and femoral artery calcifications. Degenerative changes  in the partially visualized lower lumbosacral spine. 2.9 cm ovoid  density over the proximal soft tissues of the right thigh is suspected  to represent a lipoma that measured approximately 2.5 cm on coronal  reformatted images on the previous CT.    No fracture, dislocation, or significant right hip osteoarthritis is  identified.      Impression:        No evidence of fracture.    Darnelle Bos, MD   06/02/2017 1:00 PM         EKG: EKG reviewed V-pacing.    Procedures:Procedures       Hospitalist   Signed by: Jerolyn Shin   06/02/2017 3:00 PM

## 2017-06-02 NOTE — Plan of Care (Signed)
[id number] Program    Education about New Medications and their Side effects    Dear Marco Bruce,    Its been a pleasure taking care of you during your hospitalization here at [facility]. We have initiated a new program to educate our patients and/or their family members or designated personnel about the new medications started by your physicians and their indications along with the possible side effects. Multiple studies have shown that patients started on new medications are often unaware of the names of the medication along with the indications and their side effects which leads to decreased compliance with the medications.    During our conversation today on 06/02/2017  4:12 PM I have explained to you the name of the new medication and the indication along with some possible common side effects. Listed below are some of the new medications started during this hospitalization.     Please call the Nurse if you have any side effects while in hospital.     Please call 911 if you have any life threatening symptoms after you are discharged from the hospital.    Please inform your Primary care physician for common side effects which are not life threatening after discharge.      Medication: fluticasone/vilanterol (Breo)   This Medication is used for:   COPD or Asthma   Decreases airway constriction and inflammation    Common Side Effects are:   Headache   Sore throat    A note from your nurse:  Rinse your mouth with water and spit out after using.  Call your nurse immediately if you notice itching, hives, swelling or trouble breathing     Discussed with family member present.     Thank you for your time.    Irene Shipper, RT  06/02/2017  4:12 PM  [facility]  [address]  [place], [place]  [address]

## 2017-06-02 NOTE — ED Provider Notes (Signed)
Physician/Midlevel provider first contact with patient: 06/02/17 1138         History     Chief Complaint   Patient presents with   . Hip Pain   . Fever   . Joint Pain     84 year old male here with generalized fatigue and hip pain.  Patient has had generalized weakness for last couple days but this morning when he woke up he developed right hip pain.  Patient and family denies him falling or injuring the hip.  Patient denies any chest pain, shortness of breath, nausea, vomiting, diarrhea or any other symptoms.      The history is provided by the patient (son). Language interpreter used: pt wanted son to translate.   Hip Pain   Associated symptoms include arthralgias, fatigue and weakness. Pertinent negatives include no fever.   Fever            Past Medical History:   Diagnosis Date   . Acute diastolic heart failure 27/74/1287   . Acute on chronic diastolic congestive heart failure 08/13/2016   . Acute pulmonary edema 05/26/2015   . ARF (acute renal failure) 05/26/2015   . Chronic diastolic heart failure 86/76/7209   . Chronic gout    . Chronic pain syndrome 07/07/2015   . CKD (chronic kidney disease) stage 4, GFR 15-29 ml/min     2018 baseline creatinine in EPIC is 1.5-2.3.   . Congestive heart failure (CHF)     EF 40% 5/18 echo, on lasix   . Current chronic use of systemic steroids     For Polymyalgia rheumatica   . Essential hypertension    . Mixed hyperlipidemia    . NSTEMI (non-ST elevated myocardial infarction) 08/13/2016   . Osteoarthritis of both knees    . Peripheral edema 06/16/2015   . Pneumonia due to infectious organism, unspecified laterality, unspecified part of lung 06/25/2015   . Polymyalgia rheumatica     chronic steroids   . 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 Second degree heart block by electrocardiogram (ECG) 04/27/2015   . Type 2 diabetes mellitus with complication, with long-term current use of insulin     12/01/2016 Hemoglobin A1c 8.2%       Past Surgical History:   Procedure Laterality  Date   . ABDOMINAL SURGERY     . CHOLECYSTECTOMY     . PACEMAKER Left 2017       History reviewed. No pertinent family history.    Social  Social History   Substance Use Topics   . Smoking status: Never Smoker   . Smokeless tobacco: Never Used   . Alcohol use No      Comment: former alcoholic       .     No Known Allergies    Home Medications     Med List Status:  In Progress Set By: Benjaman Lobe, RN at 06/02/2017 11:49 AM                albuterol (PROAIR HFA) 108 (90 Base) MCG/ACT inhaler     Inhale 2 puffs into the lungs every 6 (six) hours as needed for Wheezing.     allopurinol (ZYLOPRIM) 100 MG tablet     Take 1 tablet (100 mg total) by mouth daily.     atorvastatin (LIPITOR) 20 MG tablet     Take 1 tablet (20 mg total) by mouth daily.     chlorthalidone (HYGROTEN) 50 MG tablet     TAKE ONE  TABLET BY MOUTH EVERY DAY IN THE MORNING WITH FOOD     cyclobenzaprine (FLEXERIL) 10 MG tablet     Take 1 tablet (10 mg total) by mouth every 8 (eight) hours as needed for Muscle spasms.     docusate sodium (COLACE) 100 MG capsule     Take by mouth.     fluticasone (FLONASE) 50 MCG/ACT nasal spray     1 spray by Nasal route 2 (two) times daily.     furosemide (LASIX) 20 MG tablet     Take 2 tablets (40 mg total) by mouth daily.     gabapentin (NEURONTIN) 100 MG capsule     TAKE ONE CAPSULE BY MOUTH EVERY 8 HOURS     glucose blood (TRUE METRIX BLOOD GLUCOSE TEST) test strip     1 each by Other route 2 (two) times daily.Use as instructed     hydrALAZINE (APRESOLINE) 50 MG tablet     Take 1 tablet (50 mg total) by mouth every morning.     insulin glargine (LANTUS SOLOSTAR) 100 UNIT/ML injection pen     Inject 20 Units into the skin nightly.     Insulin Pen Needle 31G X 5 MM Misc     INJECT INSULIN DAILY AS DIRECTED     Linagliptin 5 MG Tab     Take 1 tablet (5 mg total) by mouth daily.     NIFEdipine (PROCARDIA XL) 60 MG 24 hr tablet     TAKE 2 TABLETS BY MOUTH Every morning.     pantoprazole (PROTONIX) 40 MG tablet     TAKE  ONE TABLET BY MOUTH EVERY DAY     predniSONE (DELTASONE) 5 MG tablet     Take 1 tablet (5 mg total) by mouth daily.     sodium bicarbonate 650 MG tablet     TAKE 2 TABLETS BY MOUTH TWO TIMES A DAY     valsartan (DIOVAN) 80 MG tablet     Take 1 tablet (80 mg total) by mouth daily.           Review of Systems   Constitutional: Positive for fatigue. Negative for fever.   HENT: Negative.    Eyes: Negative.    Respiratory: Negative.    Cardiovascular: Negative.    Gastrointestinal: Negative.    Genitourinary: Negative.    Musculoskeletal: Positive for arthralgias and back pain.   Skin: Negative.    Neurological: Positive for weakness.   Hematological: Negative.    Psychiatric/Behavioral: Negative.    All other systems reviewed and are negative.      Physical Exam    BP: 147/63, Heart Rate: 69, Temp: 98.4 F (36.9 C), Resp Rate: 18, SpO2: 100 %, Weight: 72.6 kg    Physical Exam   Constitutional: He is oriented to person, place, and time. He appears well-developed and well-nourished.   HENT:   Head: Normocephalic and atraumatic.   Mouth/Throat: Oropharynx is clear and moist.   Eyes: Pupils are equal, round, and reactive to light. EOM are normal.   Neck: Normal range of motion. Neck supple.   Cardiovascular: Normal rate, regular rhythm, normal heart sounds and intact distal pulses.    Pulmonary/Chest: Effort normal and breath sounds normal.   Abdominal: Soft. Bowel sounds are normal.   Musculoskeletal:   Tenderness to palpation in bilateral hips.  Full range of motion of the hip with internal, external rotation as well as flexion and extension.  2+ pedal pulses were normal cap refill and  sensation distally.   Neurological: He is alert and oriented to person, place, and time.   Skin: Skin is warm and dry. Capillary refill takes less than 2 seconds.   Psychiatric: He has a normal mood and affect. His behavior is normal. Judgment and thought content normal.   Nursing note and vitals reviewed.        MDM and ED Course     ED  Medication Orders     None             MDM  Number of Diagnoses or Management Options  Generalized weakness:   Hyponatremia:   Diagnosis management comments: EKG Interpretation  EKG interpreted/review by ED Physician Assistant  Atrial paced rhythm  Left axis deviation  Nonspecific intraventricular conduction block  PR, QRS and QT intervals:  normal for age and rate  ST Segments: No deviations suggestive of ischemia  Impression: Abnormal ECG    *This note was generated by the Epic EMR system/ Dragon speech recognition and may contain inherent errors or omissions not intended by the user. Grammatical errors, random word insertions, deletions, pronoun errors and incomplete sentences are occasional consequences of this technology due to software limitations. Not all errors are caught or corrected. If there are questions or concerns about the content of this note or information contained within the body of this dictation they should be addressed directly with the author for clarification    The attending signature signifies review and agreement of the history , PE, evaluation, clinical impression and discharge plan except as otherwise noted.    I, Kalub Morillo Nicanor Bake, have been the primary provider for Marco Bruce during this Emergency Dept visit.    I reviewed nursing recorded vitals and history including PMSFHX    Oxygen saturation by pulse oximetry is 95%-100%, Normal.  Interventions: None Needed.    Plan:  Patient with hyponatremia, which likely is causing him to be weak.  Hip pain, less likely due to fracture or septic joint.  We will admit for further evaluation and treatment of the hyponatremia.       Amount and/or Complexity of Data Reviewed  Clinical lab tests: ordered and reviewed  Tests in the radiology section of CPT: ordered and reviewed  Tests in the medicine section of CPT: ordered and reviewed  Review and summarize past medical records: yes    Risk of Complications, Morbidity, and/or Mortality  Presenting  problems: high  Diagnostic procedures: high  Management options: high    Patient Progress  Patient progress: stable                   Procedures    Results     Procedure Component Value Units Date/Time    Hemoglobin A1c [[id number]] Collected:  06/02/17 1155    Specimen:  Blood Updated:  06/02/17 1359    Vitamin B12 [[id number]] Collected:  06/02/17 1154    Specimen:  Blood Updated:  06/02/17 1351    Vitamin D,25 OH, Total [[id number]] Collected:  06/02/17 1154     Updated:  06/02/17 1351    TSH [[id number]] Collected:  06/02/17 1154    Specimen:  Blood Updated:  06/02/17 1351    B-type Natriuretic Peptide [[id number]]  (Abnormal) Collected:  06/02/17 1155    Specimen:  Blood Updated:  06/02/17 1249     B-Natriuretic Peptide 411.1 (H) pg/mL     Manual Differential [[id number]]  (Abnormal) Collected:  06/02/17 1155     Updated:  06/02/17 1242  Segmented Neutrophils 35 %      Band Neutrophils 0 %      Lymphocytes Manual 15 %      Monocytes Manual 8 %      Eosinophils Manual 42 %      Basophils Manual 0 %      Abs Seg Manual 3.05 x10 3/uL      Bands Absolute 0.00 x10 3/uL      Absolute Lymph Manual 1.31 x10 3/uL      Monocytes Absolute 0.70 x10 3/uL      Absolute Eos Manual 3.66 (H) x10 3/uL      Absolute Baso Manual 0.00 x10 3/uL     Cell MorpHology [[id number]] Collected:  06/02/17 1155     Updated:  06/02/17 1242     Cell Morphology: Normal     Platelet Estimate Normal    CBC with differential [[id number]]  (Abnormal) Collected:  06/02/17 1155    Specimen:  Blood from Blood Updated:  06/02/17 1241     WBC 8.72 x10 3/uL      Hgb 11.6 (L) g/dL      Hematocrit 32.4 (L) %      Platelets 158 x10 3/uL      RBC 3.73 (L) x10 6/uL      MCV 86.9 fL      MCH 31.1 pg      MCHC 35.8 g/dL      RDW 13 %      MPV 9.8 fL      Nucleated RBC 0.0 /100 WBC      Absolute NRBC 0.00 x10 3/uL     Phosphorus [[id number]] Collected:  06/02/17 1155    Specimen:  Blood Updated:  06/02/17 1240     Phosphorus 3.8 mg/dL     Magnesium [[id number]]  Collected:  06/02/17 1155    Specimen:  Blood Updated:  06/02/17 1240     Magnesium 1.8 mg/dL     Troponin I [[id number]] Collected:  06/02/17 1155    Specimen:  Blood Updated:  06/02/17 1230     Troponin I 0.01 ng/mL     Comprehensive metabolic panel [[id number]]  (Abnormal) Collected:  06/02/17 1155    Specimen:  Blood Updated:  06/02/17 1222     Glucose 87 mg/dL      BUN 19.1 mg/dL      Creatinine 1.6 (H) mg/dL      Sodium 122 (L) mEq/L      Potassium 4.2 mEq/L      Chloride 90 (L) mEq/L      CO2 25 mEq/L      Calcium 9.2 mg/dL      Protein, Total 6.3 g/dL      Albumin 4.0 g/dL      AST (SGOT) 25 U/L      ALT 8 U/L      Alkaline Phosphatase 63 U/L      Bilirubin, Total 0.3 mg/dL      Globulin 2.3 g/dL      Albumin/Globulin Ratio 1.7     Anion Gap 7.0    C Reactive Protein [[id number]] Collected:  06/02/17 1155    Specimen:  Blood Updated:  06/02/17 1222     C-Reactive Protein 0.4 mg/dL     GFR [[id number]] Collected:  06/02/17 1155     Updated:  06/02/17 1222     EGFR 40.9    Rapid influenza A/B antigens [[id number]] Collected:  06/02/17 1155    Specimen:  Nasopharyngeal from Nasal Aspirate  Updated:  06/02/17 1219    Narrative:       ORDER#: [id number]                                    ORDERED BY: Brynda Rim  SOURCE: Nasal Aspirate                               COLLECTED:  06/02/17 11:55  ANTIBIOTICS AT COLL.:                                RECEIVED :  06/02/17 12:02  Influenza Rapid Antigen A&B                FINAL       06/02/17 12:19  06/02/17   Negative for Influenza A and B             Reference Range: Negative      Sedimentation rate (ESR) [[id number]]  (Abnormal) Collected:  06/02/17 1155    Specimen:  Blood Updated:  06/02/17 1218     Sed Rate 28 (H) mm/Hr           Radiology Results (24 Hour)     Procedure Component Value Units Date/Time    XR Chest 2 Views [[id number]] Collected:  06/02/17 1300    Order Status:  Completed Updated:  06/02/17 1305    Narrative:       Clinical History: Generalized  weakness.    Findings: AP and lateral views of the chest. Compared with prior studies  including 05/28/2017.    Calcification and tortuosity of the thoracic aorta. Dual-lead pacemaker  wires again overlie the heart. Stable prominent heart, magnified by AP  technique. Pulmonary vascularity is normal.     Stable elevation of the right hemidiaphragm. No focal consolidation or  pleural effusion.    Evaluation is somewhat limited due to patient body habitus, mild lung  hypoinflation, and AP technique.      Impression:        Stable prominent heart.    Darnelle Bos, MD   06/02/2017 1:01 PM    Hip Right AP and Lateral with Pelvis [[id number]] Collected:  06/02/17 1257    Order Status:  Completed Updated:  06/02/17 1304    Narrative:       Clinical History: Right hip pain.    Findings: AP view of the pelvis and frogleg lateral view of the right  hip.     Prominent iliac and femoral artery calcifications. Degenerative changes  in the partially visualized lower lumbosacral spine. 2.9 cm ovoid  density over the proximal soft tissues of the right thigh is suspected  to represent a lipoma that measured approximately 2.5 cm on coronal  reformatted images on the previous CT.    No fracture, dislocation, or significant right hip osteoarthritis is  identified.      Impression:        No evidence of fracture.    Darnelle Bos, MD   06/02/2017 1:00 PM          I have reviewed all labs and/or radiological studies. I have reviewed all xrays if any myself on the PACS system.    Clinical Impression & Disposition     Clinical Impression  Final diagnoses:   Hyponatremia  Generalized weakness        ED Disposition     ED Disposition Condition Date/Time Comment    Admit  Thu Jun 02, 2017  1:23 PM Admitting Physician: Melbourne Abts [[id number]]   Diagnosis: Hyponatremia [685992]   Estimated Length of Stay: > or = to 2 midnights   Tentative Discharge Plan?: Home or Self Care [1]   Patient Class: Inpatient [101]             New  Prescriptions    No medications on file                 Brynda Rim [place], [place]  06/02/17 1403       Bedelia Person, MD  06/02/17 [phone]

## 2017-06-03 ENCOUNTER — Inpatient Hospital Stay: Payer: Medicare Other

## 2017-06-03 DIAGNOSIS — R7989 Other specified abnormal findings of blood chemistry: Secondary | ICD-10-CM

## 2017-06-03 DIAGNOSIS — M1990 Unspecified osteoarthritis, unspecified site: Secondary | ICD-10-CM

## 2017-06-03 DIAGNOSIS — I129 Hypertensive chronic kidney disease with stage 1 through stage 4 chronic kidney disease, or unspecified chronic kidney disease: Secondary | ICD-10-CM

## 2017-06-03 DIAGNOSIS — R05 Cough: Secondary | ICD-10-CM

## 2017-06-03 DIAGNOSIS — M25551 Pain in right hip: Secondary | ICD-10-CM

## 2017-06-03 DIAGNOSIS — N183 Chronic kidney disease, stage 3 (moderate): Secondary | ICD-10-CM

## 2017-06-03 DIAGNOSIS — E119 Type 2 diabetes mellitus without complications: Secondary | ICD-10-CM

## 2017-06-03 DIAGNOSIS — M353 Polymyalgia rheumatica: Secondary | ICD-10-CM

## 2017-06-03 DIAGNOSIS — M25552 Pain in left hip: Secondary | ICD-10-CM

## 2017-06-03 DIAGNOSIS — D649 Anemia, unspecified: Secondary | ICD-10-CM

## 2017-06-03 DIAGNOSIS — I5033 Acute on chronic diastolic (congestive) heart failure: Secondary | ICD-10-CM

## 2017-06-03 DIAGNOSIS — R062 Wheezing: Secondary | ICD-10-CM

## 2017-06-03 DIAGNOSIS — M25562 Pain in left knee: Secondary | ICD-10-CM

## 2017-06-03 DIAGNOSIS — I340 Nonrheumatic mitral (valve) insufficiency: Secondary | ICD-10-CM

## 2017-06-03 DIAGNOSIS — G8929 Other chronic pain: Secondary | ICD-10-CM

## 2017-06-03 DIAGNOSIS — M109 Gout, unspecified: Secondary | ICD-10-CM

## 2017-06-03 LAB — COMPREHENSIVE METABOLIC PANEL
ALT: 7 U/L (ref 0–55)
AST (SGOT): 19 U/L (ref 5–34)
Albumin/Globulin Ratio: 1.8 (ref 0.9–2.2)
Albumin: 3.5 g/dL (ref 3.5–5.0)
Alkaline Phosphatase: 48 U/L (ref 38–106)
Anion Gap: 7 (ref 5.0–15.0)
BUN: 19.5 mg/dL (ref 9.0–28.0)
Bilirubin, Total: 0.3 mg/dL (ref 0.2–1.2)
CO2: 25 mEq/L (ref 22–29)
Calcium: 9.1 mg/dL (ref 7.9–10.2)
Chloride: 91 mEq/L — ABNORMAL LOW (ref 100–111)
Creatinine: 1.7 mg/dL — ABNORMAL HIGH (ref 0.7–1.3)
Globulin: 1.9 g/dL — ABNORMAL LOW (ref 2.0–3.6)
Glucose: 116 mg/dL — ABNORMAL HIGH (ref 70–100)
Potassium: 4.4 mEq/L (ref 3.5–5.1)
Protein, Total: 5.4 g/dL — ABNORMAL LOW (ref 6.0–8.3)
Sodium: 123 mEq/L — ABNORMAL LOW (ref 136–145)

## 2017-06-03 LAB — URINALYSIS, REFLEX TO MICROSCOPIC EXAM IF INDICATED
Bilirubin, UA: NEGATIVE
Blood, UA: NEGATIVE
Glucose, UA: NEGATIVE
Ketones UA: NEGATIVE
Leukocyte Esterase, UA: NEGATIVE
Nitrite, UA: NEGATIVE
Protein, UR: NEGATIVE
Specific Gravity UA: 1.011 (ref 1.001–1.035)
Urine pH: 6 (ref 5.0–8.0)
Urobilinogen, UA: NORMAL mg/dL

## 2017-06-03 LAB — MAN DIFF ONLY
Band Neutrophils Absolute: 0 10*3/uL (ref 0.00–1.00)
Band Neutrophils: 0 %
Basophils Absolute Manual: 0 10*3/uL (ref 0.00–0.20)
Basophils Manual: 0 %
Eosinophils Absolute Manual: 1.49 10*3/uL — ABNORMAL HIGH (ref 0.00–0.70)
Eosinophils Manual: 21 %
Lymphocytes Absolute Manual: 1.35 10*3/uL (ref 0.50–4.40)
Lymphocytes Manual: 19 %
Monocytes Absolute: 0.43 10*3/uL (ref 0.00–1.20)
Monocytes Manual: 6 %
Neutrophils Absolute Manual: 3.83 10*3/uL (ref 1.80–8.10)
Segmented Neutrophils: 54 %

## 2017-06-03 LAB — ECG 12-LEAD
Atrial Rate: 48 {beats}/min
P-R Interval: 148 ms
Q-T Interval: 490 ms
QRS Duration: 198 ms
QTC Calculation (Bezet): 558 ms
R Axis: -73 degrees
T Axis: 108 degrees
Ventricular Rate: 78 {beats}/min

## 2017-06-03 LAB — CELL MORPHOLOGY
Cell Morphology: NORMAL
Platelet Estimate: NORMAL

## 2017-06-03 LAB — GLUCOSE WHOLE BLOOD - POCT
Whole Blood Glucose POCT: 136 mg/dL — ABNORMAL HIGH (ref 70–100)
Whole Blood Glucose POCT: 100 mg/dL (ref 70–100)
Whole Blood Glucose POCT: 105 mg/dL — ABNORMAL HIGH (ref 70–100)
Whole Blood Glucose POCT: 137 mg/dL — ABNORMAL HIGH (ref 70–100)
Whole Blood Glucose POCT: 118 mg/dL — ABNORMAL HIGH (ref 70–100)

## 2017-06-03 LAB — SODIUM, URINE, RANDOM: Urine Sodium Random: 35 mEq/L

## 2017-06-03 LAB — CBC AND DIFFERENTIAL
Absolute NRBC: 0 10*3/uL (ref 0.00–0.00)
Hematocrit: 28.7 % — ABNORMAL LOW (ref 37.6–49.6)
Hgb: 9.9 g/dL — ABNORMAL LOW (ref 12.5–17.1)
MCH: 29.8 pg (ref 25.1–33.5)
MCHC: 34.5 g/dL (ref 31.5–35.8)
MCV: 86.4 fL (ref 78.0–96.0)
MPV: 9.6 fL (ref 8.9–12.5)
Nucleated RBC: 0 /100 WBC (ref 0.0–0.0)
Platelets: 157 10*3/uL (ref 142–346)
RBC: 3.32 10*6/uL — ABNORMAL LOW (ref 4.20–5.90)
RDW: 13 % (ref 11–15)
WBC: 7.1 10*3/uL (ref 3.10–9.50)

## 2017-06-03 LAB — GFR: EGFR: 38.2

## 2017-06-03 LAB — OSMOLALITY, URINE: Urine Osmolality: 257 mosm/kg — ABNORMAL LOW (ref 300–1094)

## 2017-06-03 MED ORDER — FUROSEMIDE 10 MG/ML IJ SOLN
40.00 mg | Freq: Once | INTRAMUSCULAR | Status: AC
Start: 2017-06-03 — End: 2017-06-03
  Administered 2017-06-03: 16:00:00 40 mg via INTRAVENOUS
  Filled 2017-06-03: qty 4

## 2017-06-03 NOTE — Progress Notes (Signed)
Ask3Teach3 Program    Education about New Medications and their Side effects    Dear [name],    Its been a pleasure taking care of you during your hospitalization here at [facility]. We have initiated a new program to educate our patients and/or their family members or designated personnel about the new medications started by your physicians and their indications along with the possible side effects. Multiple studies have shown that patients started on new medications are often unaware of the names of the medication along with the indications and their side effects which leads to decreased compliance with the medications.    During our conversation today on 06/03/2017  8:27 PM I have explained to you the name of the new medication and the indication along with some possible common side effects. Listed below are some of the new medications started during this hospitalization.     Please call the Nurse if you have any side effects while in hospital.     Please call 911 if you have any life threatening symptoms after you are discharged from the hospital.    Please inform your Primary care physician for common side effects which are not life threatening after discharge.    Albuterol/Ipratropium (Duoneb) Asthma, COPD Headache, cough, nervousness, upper airway infection       Thank you for your time.    [place], [place]  06/03/2017  8:27 PM  [facility]  [address]  [place], [place]  [id number]

## 2017-06-03 NOTE — Progress Notes (Signed)
84 year old male here with generalized fatigue and hip pain.  Patient has had generalized weakness for last couple days but this morning when he woke up he developed right hip pain.  Patient and family denies him falling or injuring the hip.  Patient denies any chest pain, shortness of breath, nausea, vomiting, diarrhea or any other symptoms.    Patient lives with his daughter and other family members in the house.  PT/OT is ordered and discharge needs depend on their recommendations.        06/03/17 1625   Patient Type   Within 30 Days of Previous Admission? No   Healthcare Decisions   Interviewed: Family   Name of interviewee if other than the pt: Destyn Schuyler, daughter, [phone] cell    Interviewee Contact Information: Osamah Schmader, daughter, [phone] cell    Orientation/Decision Making Abilities of Patient Other (coment)  (paitent does not speak English so his daughter provided the information )   Advance Directive Patient does not have advance directive   Healthcare Agent Appointed No   Additional Emergency Contacts? [name], Grandchild,    Prior to admission   Prior level of function Ambulates with assistive device;Needs assistance with ADLs  (sometimes patient needs help, but he has assistance from [facility] agency and they can help him)   Type of Residence Private residence   Home Layout Multi-level;Bed/bath upstairs;Performs ADL's on one level   Have running water, electricity, heat, etc? Yes   Living Arrangements Children;Family members   How do you get to your MD appointments? daughter    How do you get your groceries? daughter    Who fixes your meals? daughter    Who does your laundry? daughter   Who picks up your prescriptions? daughter    Dressing Needs assistance   Grooming Needs assistance   Feeding Independent   Bathing Needs assistance   Toileting Needs assistance   DME Currently at [facility] wheel walker;Wheelchair-manual;3-in-1 BSC;Single point cane   [place] aide   Name of [place] agency   Frequency of services 7am to 3:00 pm Monday thru Sunday   Prior SNF admission? (Detail) [facility] in the past    Prior Rehab admission? (Detail) none   Adult Protective Services (APS) involved? No   Discharge Planning   Support Systems Children;Family members   Patient expects to be discharged to: home   Anticipated [place] plan discussed with: Same as interviewed;Family   [place] discussion contact information: Jyaire Koudelka, daughter, [phone] cell    Potential barriers to discharge: Testing/procedure   Mode of transportation: Private car (family member)   Consults/Providers   PT Evaluation Needed 1   OT Evalulation Needed 1   SLP Evaluation Needed 2   Outcome Palliative Care Screen Screened but did not meet criteria for intervention   Correct PCP listed in Epic? Yes   Important Message from [facility] Notice   Patient received 1st IMM Letter? Yes   Date of most recent IMM given: 06/02/17

## 2017-06-03 NOTE — Progress Notes (Signed)
Transferred to [facility] room 247, report given to Sam, monitor tech updated on move, meds from drawer tubed.

## 2017-06-03 NOTE — Progress Notes (Signed)
[facility].  Progress Note    Assessment:     AKI due to fluid status and use of diuretics; serum creatinine rising to 1.7 mg/dL;    Hyponatremia due to hypervolemia and use of chlorthalidone    CHF   DM with CKD   Hypertension with CKD   Anemia   CKD III    Plan:    -Lasix IV daily   -fluid restriction to 1L per day   -labs   -supportive care    Linde Gillis, MD  Office [phone]  ++++++++++++++++++++++++++++++++++++++++++++++++++++++++++++++  Subjective:  No new complaints    Medications:  Scheduled Meds:  Current Facility-Administered Medications   Medication Dose Route Frequency   . albuterol-ipratropium  3 mL Nebulization Q6H [place]   . allopurinol  100 mg Oral Daily   . atorvastatin  20 mg Oral Daily   . fluticasone furoate-vilanterol  1 puff Inhalation QAM   . gabapentin  100 mg Oral TID   . hydrALAZINE  50 mg Oral QAM   . influenza  0.5 mL Intramuscular Once   . insulin glargine  10 Units Subcutaneous QHS   . insulin lispro  1-3 Units Subcutaneous QHS   . insulin lispro  1-5 Units Subcutaneous TID AC   . metoprolol succinate XL  25 mg Oral Daily   . NIFEdipine  60 mg Oral QPM   . pantoprazole  40 mg Oral QAM AC   . sodium bicarbonate  1,300 mg Oral BID   . valsartan  80 mg Oral Daily     Continuous Infusions:  PRN Meds:acetaminophen **OR** acetaminophen, albuterol, Nursing communication: Adult Hypoglycemia Treatment Algorithm **AND** dextrose **AND** dextrose **AND** glucagon (rDNA), naloxone, ondansetron **OR** ondansetron    Objective:  Vital signs in last 24 hours:  Temp:  [97.4 F (36.3 C)-99.2 F (37.3 C)] 98.1 F (36.7 C)  Heart Rate:  [66-79] 69  Resp Rate:  [14-20] 14  BP: (116-155)/(56-71) 155/68  Intake/Output last 24 hours:    Intake/Output Summary (Last 24 hours) at 06/03/17 1231  Last data filed at 06/03/17 1103   Gross per 24 hour   Intake              472 ml   Output             1050 ml   Net             -578 ml     Intake/Output this  shift:  I/O this shift:  In: -   Out: 100 [Urine:100]    Physical Exam:   Gen: WD WN NAD   CV: S1 S2 N RRR   Chest: CTAB   Ab: ND NT soft no HSM +BS   Ext: No C/E    Labs:    Recent Labs  Lab 06/03/17  0627 06/02/17  1614 06/02/17  1155   Glucose 116* 78 87   BUN 19.5 17.4 19.1   Creatinine 1.7* 1.5* 1.6*   Calcium 9.1 8.7 9.2   Sodium 123* 121* 122*   Potassium 4.4 3.7 4.2   Chloride 91* 92* 90*   CO2 25 23 25    Albumin 3.5  --  4.0   Phosphorus  --   --  3.8   Magnesium  --   --  1.8       Recent Labs  Lab 06/03/17  0627 06/02/17  1155   WBC 7.10 8.72   Hgb 9.9* 11.6*  Hematocrit 28.7* 32.4*   MCV 86.4 86.9   MCH 29.8 31.1   MCHC 34.5 35.8   RDW 13 13   MPV 9.6 9.8   Platelets 157 158

## 2017-06-03 NOTE — Plan of Care (Signed)
Problem: Safety  Goal: Patient will be free from injury during hospitalization  Outcome: Progressing   06/03/17 2002   Goal/Interventions addressed this shift   Patient will be free from injury during hospitalization  Assess patient's risk for falls and implement fall prevention plan of care per policy;Provide and maintain safe environment;Use appropriate transfer methods;Ensure appropriate safety devices are available at the bedside;Include patient/ family/ care giver in decisions related to safety;Hourly rounding

## 2017-06-03 NOTE — PT Eval Note (Signed)
[facility]  [place], [place]    Department of Rehabilitation  [phone]    Physical Therapy Evaluation    Patient: Marco Bruce    MRN#: [id number]     [id number]    Time of treatment: Time Calculation  PT Received On: 06/03/17  Start Time: 0931  Stop Time: 0943  Time Calculation (min): 12 min    PT Visit Number: 1    Consult received for Marco Bruce for PT Evaluation and Treatment.  Patient's medical condition is appropriate for Physical therapy intervention at this time.      Assessment:   Marco Bruce is a 84 y.o. male admitted 06/02/2017.  Pt's functional mobility is impacted by:  decreased activity tolerance, decreased balance, ROM deficits and decreased strength.  There are a few comorbidities or other factors that affect plan of care and require modification of task including: assistive device needed for mobility and PMH.  Standardized tests and exams incorporated into evaluation include AMPAC mobility.  Pt demonstrates a stable clinical presentation due to no adverse response to activity.   Pt would continue to benefit from PT to address these deficits and increase functional independence.     Complexity Level Hx and Co  morbidites Examination Clinical Decision Making Clinical Presentation   Low no impact 1-2 elements Limited options Stable   Moderate   1-2 factors 3 or more   Several options Evolving, plan may alter   High 3 or more 4 or more Multiple options Unstable, unpredictable       Impairments: Assessment: Decreased LE strength;Decreased endurance/activity tolerance;Decreased balance;Decreased functional mobility.     Therapy Diagnosis: generalized weakness and decreased functional mobility  due to knee pain and current illness. Without therapy interventions, patient is at risk for falls, dependence on caregivers for mobility and dependence on caregivers for ADL's.    Rehabilitation Potential: Prognosis: Fair;With continued PT status post acute discharge      Plan:     Treatment/Interventions: Gait training;Neuromuscular re-education;Functional transfer training;LE strengthening/ROM;Endurance training;Bed mobility PT Frequency: 3-4x/wk    Risks/Benefits/POC Discussed with Pt/Family: With patient     Goals:   Goals  Goal Formulation: With patient/family  Time for Goal Acheivement: By time of discharge  Goals: Select goal  Pt Will Go Supine To Sit: with minimal assist;to maximize functional mobility and independence;5 visits  Pt Will Perform Sit to Stand: with minimal assist;to maximize functional mobility and independence;5 visits  Pt Will Transfer Bed/Chair: with rolling walker;with minimal assist;to maximize functional mobility and independence;5 visits      Discharge Recommendations:   Based on today's session patient's discharge recommendation is the following: SNF     If Discharge Recommendation: SNF is not available, then the patient will need home health services, increase supervision , assistance with mobility, assistance with ADL's and WC.         Precautions and Contraindications: Fall risk       Medical Diagnosis: Mixed hyperlipidemia [E78.2]  Hyponatremia [E87.1]  Generalized weakness [R53.1]  Essential hypertension [I10]  Type 2 diabetes mellitus with complication, with long-term current use of insulin [E11.8, Z79.4]  Stage 4 chronic kidney disease [N18.4]    History of Present Illness: Marco Bruce is a 84 y.o. male admitted on 06/02/2017 with hyponatremia, SOB and weakness. Pt c/o increased B knee pain.    Patient Active Problem List   Diagnosis   . Stage 4 chronic kidney disease   . Renovascular hypertension   . Hyponatremia   .  Essential hypertension   . Acute on chronic diastolic congestive heart failure   . Hypokalemia   . Mixed hyperlipidemia   . Type 2 diabetes mellitus with complication, with long-term current use of insulin   . Acute cystitis without hematuria   . Generalized weakness        Past Medical/Surgical History:  Past Medical History:   Diagnosis Date    . Acute diastolic heart failure 92/76/3943   . Acute on chronic diastolic congestive heart failure 08/13/2016   . Acute pulmonary edema 05/26/2015   . ARF (acute renal failure) 05/26/2015   . Chronic diastolic heart failure 20/06/7942   . Chronic gout    . Chronic pain syndrome 07/07/2015   . CKD (chronic kidney disease) stage 4, GFR 15-29 ml/min     2018 baseline creatinine in EPIC is 1.5-2.3.   . Congestive heart failure (CHF)     EF 40% 5/18 echo, on lasix   . Current chronic use of systemic steroids     For Polymyalgia rheumatica   . Essential hypertension    . Mixed hyperlipidemia    . NSTEMI (non-ST elevated myocardial infarction) 08/13/2016   . Osteoarthritis of both knees    . Peripheral edema 06/16/2015   . Pneumonia due to infectious organism, unspecified laterality, unspecified part of lung 06/25/2015   . Polymyalgia rheumatica     chronic steroids   . 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 Second degree heart block by electrocardiogram (ECG) 04/27/2015   . Type 2 diabetes mellitus with complication, with long-term current use of insulin     12/01/2016 Hemoglobin A1c 8.2%      Past Surgical History:   Procedure Laterality Date   . ABDOMINAL SURGERY     . CHOLECYSTECTOMY     . PACEMAKER Left 2017         Social History:  Prior Level of Function  Prior level of function: Independent with ADLs, Ambulates with assistive device  Assistive Device: Single point cane  Baseline Activity Level: Community ambulation  Driving: does not drive  Home Living Arrangements  Living Arrangements: Family members  Type of Home: House  Home Layout: Multi-level, Bed/bath upstairs  Bathroom Shower/Tub: Engineer, materials: Grab bars in shower, Shower chair      Subjective:    Patient is agreeable to participation in the therapy session. Nursing clears patient for therapy. Reports B knee pain. Family present to assist with translation as needed during session.    Patient Goal  Patient Goal: To return home        Objective:   Observation of Patient/Vital Signs:  Patient is in bed with telemetry in place.    Cognition/Neuro Status  Arousal/Alertness: Appropriate responses to stimuli  Attention Span: Appears intact  Following Commands: Follows one step commands with increased time  Safety Awareness: minimal verbal instruction  Insights: Fully aware of deficits  Behavior: attentive;calm;cooperative    Sensation: intact light touch BLE    Gross ROM  Right Lower Extremity ROM: within functional limits  Left Lower Extremity ROM: within functional limits  Gross Strength  Right Lower Extremity Strength: 3+/5  Left Lower Extremity Strength: 3+/5       Functional Mobility  Supine to Sit: Maximal Assist;HOB raised  Sit to Supine: Moderate Assist  Sit to Stand: Maximal Assist;bed elevated;with instruction for hand placement to increase safety  Stand to Sit: Maximal Assist     PMP Activity: Step 4 - Dangle at Bedside  Balance  Balance: needs focused assessment  Sitting - Static: Good  Sitting - Dynamic: Fair  Standing - Static: Poor (with RW, unable to attain upright standing or maintain balance, 2 standing trials, max A)    Participation and Endurance  Participation Effort: good  Endurance: Tolerates 10 - 20 min exercise with multiple rests. No c/o dizziness or SOB during mobility.    AM-PACT Inpatient Short Forms  Inpatient AM-PACT Performed? (PT): Basic Mobility Inpatient Short Form  AM-PACT "6 Clicks" Basic Mobility Inpatient Short Form  Turning Over in Bed: A lot  Sitting Down On/Standing From Armchair: A lot  Lying on Back to Sitting on Side of Bed: A lot  Assist Moving to/from Bed to Chair: A lot  Assist to Walk in Hospital Room: Total  Assist to Climb 3-5 Steps with Railing: Total  PT Basic Mobility Raw Score: 10  CMS 0-100% Score: 76.75%    Treatment Activities: Performed 2 static standing trials, both trials <30 seconds due to knee pain and weakness. Unable to attain knee extension or upright posture. Educated in and  performed AP, LAQ and seated marching Bx10. Encouraged to perform LE therex throughout the day to decrease effects of immobility. Encouraged to sit OOB/EOB as tolerated for pulmonary hygiene. Instructed not to get up without assistance for safety. Pt verbalized understanding for all education provided.    Educated the patient to role of physical therapy, plan of care, goals of therapy and HEP, safety with mobility and ADLs.    At end of session pt supine in bed, call bell and items in reach. Family present. Bed alarm on.      Ladon Applebaum, PT, DPT  Pager #: [phone]

## 2017-06-03 NOTE — Plan of Care (Signed)
Problem: Safety  Goal: Patient will be free from injury during hospitalization  Outcome: Progressing   06/03/17 0106   Goal/Interventions addressed this shift   Patient will be free from injury during hospitalization  Assess patient's risk for falls and implement fall prevention plan of care per policy;Provide and maintain safe environment;Use appropriate transfer methods;Ensure appropriate safety devices are available at the bedside;Include patient/ family/ care giver in decisions related to safety;Hourly rounding;Assess for patients risk for elopement and implement Elopement Risk Plan per policy     Fall safety plan discussed with patient. Pt encouraged to call for any assistance. Call bell placed within reach. Non skid socks on. Hourly rounding will be maintained to assess needs and safety. Grandson at bedside.     Problem: Pain  Goal: Pain at adequate level as identified by patient  Outcome: Progressing   06/03/17 0106   Goal/Interventions addressed this shift   Pain at adequate level as identified by patient Identify patient comfort function goal;Assess for risk of opioid induced respiratory depression, including snoring/sleep apnea. Alert healthcare team of risk factors identified.;Assess pain on admission, during daily assessment and/or before any "as needed" intervention(s);Reassess pain within 30-60 minutes of any procedure/intervention, per Pain Assessment, Intervention, Reassessment (AIR) Cycle;Evaluate if patient comfort function goal is met;Evaluate patient's satisfaction with pain management progress;Consult/collaborate with Physical Therapy, Occupational Therapy, and/or Speech Therapy     Pt denies any pain at this time, will continue to assess and help manage.

## 2017-06-03 NOTE — Progress Notes (Signed)
Patient OOB for standing weight.  Very close to bed scale.         06/03/17 0622 06/03/17 1103   Height and Weight   Weight 76.6 kg 76.2 kg   Weight Method Bed Scale Standing Scale

## 2017-06-03 NOTE — Progress Notes (Signed)
Patient transferred from [place], vitals taken, scheduled meds given. Patient oriented to room. Call bell placed within reach.

## 2017-06-03 NOTE — Progress Notes (Signed)
Nutritional Support Services  Nutrition Assessment    Guillermo Nehring 84 y.o. male   MRN: [id number]    Summary of Nutrition Plan of Care & Recommendations:  1) Recommend adding mechanical soft to diet order as pt has difficulty chewing hard food (continue consistent carb and heart healthy diet restrictions)  2) Continue Glucerna BID  3) Monitor wt trend    -----------------------------------------------------------------------------------------------------------------                                                      Assessment Data:   Referral Source: Nursing Screen  Reason for Referral: unsure wt loss + poor appetite    Subjective Nutrition: Spoke with pt's grandson at bedside as pt was sleeping. Grandson reports pt's appetite has been "barely there" for the past several months. Reports pt only consumes 2  meals daily. Said pt has difficulty chewing hard foods. Grandson said pt's UBW is 160# and he has not lost any wt recently. Reports no nausea and normal bms.   Hospital Admission: admitted for hyponatremia, acute on chronic CHF, left knee severe pain, chronic cough, HTN    Medical Hx:  has a past medical history of Acute diastolic heart failure (03/54/6568); Acute on chronic diastolic congestive heart failure (08/13/2016); Acute pulmonary edema (05/26/2015); ARF (acute renal failure) (05/26/2015); Chronic diastolic heart failure (12/75/1700); Chronic gout; Chronic pain syndrome (07/07/2015); CKD (chronic kidney disease) stage 4, GFR 15-29 ml/min; Congestive heart failure (CHF); Current chronic use of systemic steroids; Essential hypertension; Mixed hyperlipidemia; NSTEMI (non-ST elevated myocardial infarction) (08/13/2016); Osteoarthritis of both knees; Peripheral edema (06/16/2015); Pneumonia due to infectious organism, unspecified laterality, unspecified part of lung (06/25/2015); Polymyalgia rheumatica; Renovascular hypertension (08/04/2015); Second degree heart block by electrocardiogram (ECG) (04/27/2015); and Type  2 diabetes mellitus with complication, with long-term current use of insulin.  PSH: has a past surgical history that includes PACEMAKER (Left, 2017); Abdominal surgery; and Cholecystectomy.     Diet Hx: grandson said pt typically consume 2 meals daily, said they have been trying to follow a low salt diet at home, but fell off recently.   Social Hx: lives in private residence with family members    Orders Placed This Encounter   Procedures   . Diet Consistent Carbohydrate and Heart Healthy   . Ensure High Protein Supplement Quantity: A. One; Flavor: Data processing manager; Frequency: BID with meals Lunch & dinner     Intake: Grandson reports pt consumed 50% of lunch, did not consume breakfast as the meal items were too hard for pt to chew. Pt consumed 100% of one Glucerna so far today    ANTHROPOMETRIC  Anthropometrics  Height: 157.5 cm (5\' 2" )  Weight: 76.2 kg (167 lb 15.9 oz)  Weight Change: -0.54  IBW/kg (Calculated) Male: 53.64 kg  IBW/kg (Calculated) Male: 50 kg  BMI (calculated): 29.3    Nutrition Focused Physical Exam (NFPE):   Head: slight depression in temporalis region  Upper Body: deferred as pt was sleeping  Lower Body: deferred as pt was sleeping  Edema: +1 BLE  Skin: intact  GI function: last bm 2/27    ESTIMATED NEEDS  Estimated Energy Needs  Total Energy Estimated Needs: 1580-1715 kcals  Method for Estimating Needs: MSJ x 1.2-1.3    Estimated Protein Needs  Total Protein Estimated Needs: 76-91 g  Method for Estimating Needs: 1.0-1.2  g/kg    Fluid Needs  Total Fluid Estimated Needs: 1900 ml  Method for Estimating Needs: 25 ml/kg    Pertinent Medications: lasix 40 mg (BLE edema), lantus, humalog (glucose <180 mg/dl)    Pertinent labs: current HbA1c 7.6, glucose last 24 hours 65-136 mg/dl     Learning Needs: pt was educated on the consistent carbohydrate diet on 03/19/17; provided grandson with Living Well with Heart Failure booklet and discussed strategies for family to implement to follow a low salt diet as  they cook for pt.     Nutrition Assessment Summary: Spoke with grandson, who reports pt with low appetite and decent po intake. Said pt has difficulty chewing hard foods, recommend adding mechanical soft to diet order. Will continue Glucerna as pt enjoys it and typically eats two meals daily. Grandson reports no recent wt loss. Pt's wt has increased since last admission, likely d/t fluid status. Educated grandson about low sodium diet. Please monitor wt trend. No issues with n/v/d/c.                                                              Nutrition Diagnosis      Inadequate oral intake related to advanced age + poor appetite as evidenced by grandson report of pt consuming only 2 meals daily at home.                                                               Intervention     Summary of Nutrition Plan of Care & Recommendations:  1) Recommend adding mechanical soft to diet order as pt has difficulty chewing hard food (continue consistent carb and heart healthy diet restrictions)  2) Continue Glucerna BID  3) Monitor wt trend     Goals: Pt to consume >/= 50% of meals and supplements in 4-5 days                                                               Monitoring      po intake, wt, labs, GI, fluid status                                                        Evaluation   Initial goal established    Anselmo Rod, [place] Dietitian  [phone]

## 2017-06-03 NOTE — Progress Notes (Signed)
MD made aware that telemetry monitor is alarming for ST elevation.  Patient denies chest pain or SOB.  EKG shows no changes from ED strip.       06/03/17 0830   Provider Notification   Reason for Communication Evaluate   Provider Name [facility]   Provider Role Hospitalist

## 2017-06-03 NOTE — Progress Notes (Signed)
[place]    Date Time: 06/03/17 4:38 PM  Patient Name: [facility]       Patient Active Problem List   Diagnosis   . Stage 4 chronic kidney disease   . Renovascular hypertension   . Hyponatremia   . Essential hypertension   . Acute on chronic diastolic congestive heart failure   . Hypokalemia   . Mixed hyperlipidemia   . Type 2 diabetes mellitus with complication, with long-term current use of insulin   . Acute cystitis without hematuria   . Generalized weakness       Assessment:     1.         Acute on chronic diastolic congestive heart failure.                            a.         Left ventricular ejection fraction 75% by echocardiogram in 2017, reduced to                                       40% by echocardiogram in May of 2018.  2.         Moderate to severe valvular disease.                          a.         Moderate to severe mitral valve regurgitation.                          b.         Mild aortic valve regurgitation.                          c.         Pulmonary artery pressure 56 mmHg in May 2018.  3.         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  4.         Chronic kidney disease.  5.         Hypertension.  6.         Diabetes.  7.         Anemia.  8.         Former tobacco use.  9.         Hyponatremia    Recommendations:    Continue iv diuresis today - may be able to switch to oral tomorrow      Medications:      Scheduled Meds: PRN Meds:      albuterol-ipratropium 3 mL Nebulization Q6H SCH   allopurinol 100 mg Oral Daily   atorvastatin 20 mg Oral Daily   fluticasone furoate-vilanterol 1 puff Inhalation QAM   gabapentin 100 mg Oral TID   hydrALAZINE 50 mg Oral QAM   influenza 0.5 mL Intramuscular Once   insulin glargine 10 Units Subcutaneous QHS   insulin lispro 1-3 Units Subcutaneous QHS   insulin lispro 1-5 Units Subcutaneous TID AC   metoprolol succinate XL 25 mg Oral Daily   NIFEdipine 60 mg Oral QPM   pantoprazole 40 mg Oral QAM AC   sodium  bicarbonate 1,300 mg Oral BID   valsartan 80 mg Oral Daily  Continuous Infusions:     acetaminophen 650 mg Q4H PRN   Or     acetaminophen 650 mg Q4H PRN   albuterol 2 puff Q4H PRN   dextrose 15 g of glucose PRN   And     dextrose 12.5 g PRN   And     glucagon (rDNA) 1 mg PRN   naloxone 0.2 mg PRN   ondansetron 4 mg Q6H PRN   Or     ondansetron 4 mg Q6H PRN             Subjective:   Denies chest pain, SOB or palpitations.      Physical Exam:     Vitals:    06/03/17 1619   BP: 150/68   Pulse: 66   Resp: 16   Temp: 98.6 F (37 C)   SpO2: 97%     Temp (24hrs), Avg:98.4 F (36.9 C), Min:97.4 F (36.3 C), Max:99.1 F (37.3 C)      Telemetry reviewed no changes.     Intake and Output Summary (Last 24 hours) at Date Time    Intake/Output Summary (Last 24 hours) at 06/03/17 1638  Last data filed at 06/03/17 1619   Gross per 24 hour   Intake              472 ml   Output             1250 ml   Net             -778 ml       General Appearance:  Breathing comfortable, no acute distress  Head:  normocephalic  Eyes:  EOM's intact  Neck:  No carotid bruit; +jugular venous distension, brisk carotid upstroke  Lungs:  Clear to auscultation throughout, no wheezes, rhonchi or rales, good respiratory effort   Chest Wall:  No tenderness or deformity  Heart:  S1, S2 normal, no S3, no S4, 2/6 systolic murmur, PMI not displaced, no rub   Abdomen:  Soft, non-tender, positive bowel sounds  Extremities:  No cyanosis or clubbing; 1+ edema  Pulses:  Equal radial pulses, 4/4 symmetric  Neurologic:  Alert and oriented x3, mood and affect normal  Musculoskeletal: normal strength and tone    Labs:     Recent Labs  Lab 06/02/17  1928 06/02/17  1614 06/02/17  1155 06/02/17  1154   Creatine Kinase (CK)  --   --   --  156   Troponin I 0.02 0.02 0.01  --                Recent Labs  Lab 06/03/17  0627   Bilirubin, Total 0.3   Protein, Total 5.4*   Albumin 3.5   ALT 7   AST (SGOT) 19       Recent Labs  Lab 06/02/17  1155   Magnesium 1.8            Recent Labs  Lab 06/03/17  0627 06/02/17  1155   WBC 7.10 8.72   Hgb 9.9* 11.6*   Hematocrit 28.7* 32.4*   Platelets 157 158       Recent Labs  Lab 06/03/17  0627 06/02/17  1614 06/02/17  1155   Sodium 123* 121* 122*   Potassium 4.4 3.7 4.2   Chloride 91* 92* 90*   CO2 25 23 25    BUN 19.5 17.4 19.1   Creatinine 1.7* 1.5* 1.6*   EGFR 38.2 44.1 40.9   Glucose 116*  78 87   Calcium 9.1 8.7 9.2       Recent Labs  Lab 06/02/17  1154   TSH 2.54       .  Lab Results   Component Value Date    BNP 411.1 (H) 06/02/2017      Estimated Creatinine Clearance: 26.8 mL/min (A) (based on SCr of 1.7 mg/dL (H)).    Weight Monitoring 04/29/2017 05/28/2017 06/02/2017 06/02/2017 06/03/2017 06/03/2017 06/03/2017   Height 157.5 cm - 157.5 cm - - - 157.5 cm   Height Method Stated - Stated - - - -   Weight 68.04 kg 77.111 kg 72.576 kg 76.522 kg 76.613 kg 76.2 kg -   Weight Method Stated - Stated Bed Scale Bed Scale Standing Scale -   BMI (calculated) 27.5 kg/m2 - 29.3 kg/m2 - - - -         Imaging:   Radiological Procedure reviewed.              Signed by: Aleen Campi, MD        [facility]  NP [place] (8am-5pm)  MD [employer] [phone] (8am-5pm)  After hours, non urgent consult line [phone]  After Hours, urgent consults [phone]

## 2017-06-03 NOTE — Progress Notes (Signed)
[facility]  Progress Note  Patient Info:   Date/Time: 06/03/2017 / 11:02 AM   Admit Date:06/02/2017  Patient Name:Marco Bruce   JKK:[id number]   PCP: Darlina Sicilian June, DO  Attending Physician:Melenda Bielak, Hart Rochester, MD     Assessment and Plan:   84 year old [place] speaking male with history of polymyalgia rheumatica on chronic prednisone presented with generalized weakness, bilateral hip pain.    1.  Acute on chronic diastolic congestive heart failure, ejection fraction of 40 percent  --Today he has minimal crackles in both lung fields, treated with IV Lasix yesterday  ---Patient at home on 40 mg of by mouth Lasix, will defer diuretics to cardiology /nephrologygiven worsening renal dysfunction  --continue strict I's and O's, daily weights, fluid restriction, salt restriction  --continue beta blocker , metoprolol 25 mg by mouth daily, Lipitor    2.hyponatremia 123   --Most likely from congestive heart failure  --Nephrology on board.  Continue to hold off on chlorthalidone    3. Acute on Chronic kidney disease stage III  Followed by nephrology    4.  Left knee severe pain  Patient has bilateral hip pain which is chronic, xrays  showed no acute fracture or dislocation  orderedX-ray of the knee, but most likely secondary to his underlying polymyalgia rheumatica with osteoarthritis  Patient states his knee got locked up,unable to ambulate  Patient does not have any redness or swelling, unlikely gout attack  Continue PT, OT    5.  Chronic cough with minimal wheezing  Rapid flu negative, procalcitonin 0.19  Chest x-ray showed no evidence of pneumonia, lungs exam crackles both lung fields  Patient does not have any signs and symptoms of pneumonia ,We will defer antibiotics for now.  proCalcitonin most likely secondary to his chronic inflammatory state.  Continue albuterol duo nebulizerswhen necessary, continue by mouth, might need pulmonary function tests as outpatient    4.  History of hypertension on home medications  hydralazine, Toprol, Procardia, losartan  5.  Type 2 diabetes mellitus on Lantus and sliding scale insulin  6.  Moderate to severe mitral wall regurgitation  7.  History of gout, on outpatient allopurinol  8.  Chronic normocytic anemia, stable  9.  History of polymyalgia rheumatica, on outpatient prednisone    Plan of care discussed with the patient, nursing staff, patient's grandson and son      DVT Prohylaxis:scd  Central Line/Foley Catheter/PICC line status: none  Code Status: Full Code  Disposition: home   Type of Admission:Inpatient  Expected Date of Discharge: 2-3 days   Milestones required for discharge:see as above   Hospital Problems:   Active Problems:    Hyponatremia    Subjective:   06/03/17   Seen and examined this morning  Patient's grandson bedside to interpret camb[place]ian language   Denies fever, chills, nausea, vomiting, chest pain, shortness of breath  Complaints of left knee pain.  States knocked out and unable to ambulate  Right knee pain present but chronic  unable to ambulate with physical therapy  Chief Complaint:  Hip Pain; Fever; and Joint Pain    Review of Systems   Constitutional: Positive for malaise/fatigue. Negative for chills, diaphoresis, fever and weight loss.   HENT: Negative for congestion, ear discharge, ear pain, hearing loss, nosebleeds and tinnitus.    Eyes: Negative for blurred vision, double vision, photophobia, pain and discharge.   Respiratory: Positive for cough and wheezing. Negative for hemoptysis, sputum production and shortness of breath.    Cardiovascular: Negative  for chest pain, palpitations, orthopnea, claudication, leg swelling and PND.   Gastrointestinal: Negative for abdominal pain, constipation, diarrhea, heartburn, nausea and vomiting.   Genitourinary: Negative for dysuria, frequency, hematuria and urgency.   Musculoskeletal: Positive for back pain and joint pain. Negative for falls, myalgias and neck pain.   Skin: Negative for rash.   Neurological: Positive for  weakness. Negative for dizziness, tingling, tremors, sensory change and headaches.   Psychiatric/Behavioral: Negative for depression, hallucinations, substance abuse and suicidal ideas. The patient is nervous/anxious.      Objective:     Vitals:    06/03/17 0448 06/03/17 0622 06/03/17 0731 06/03/17 0811   BP: 120/59   140/61   Pulse: 74   72   Resp: 18   14   Temp: 97.4 F (36.3 C)   98.6 F (37 C)   TempSrc: Temporal Artery      SpO2: 97%  97% 97%   Weight:  76.6 kg (168 lb 14.4 oz)     Height:         Physical Exam:   Physical Exam   patient is awake, alert, oriented 3, not in acute distress.  HEENT atraumatic, normocephalic.  Pupils equal, round, reacting to light.  Extraocular movements intact.  Neck supple.  No thyromegaly.  Chest--crackles noted bilaterally at the bases.  CVS S1, S2 present.  No murmurs, rubs or gallops.  Abdomen soft, nontender, no organomegaly.  Bowel sounds present  Extremities noted to have mild bilateral lower extremity edema  Unable to bend left knee, no erythemaor tenderness noted on the anterior knee  Range of motion limited.  Skin no rashes.     neurologic no gross focal deficits  Results of Labs/imaging   Labs and radiology reports have been reviewed.    Hospitalist   Signed by:   Clayborne Artist  06/03/2017 11:02 AM    *This note was generated by the Epic EMR system/ Dragon speech recognition and may contain inherent errors or omissions not intended by the user. Grammatical errors, random word insertions, deletions, pronoun errors and incomplete sentences are occasional consequences of this technology due to software limitations. Not all errors are caught or corrected. If there are questions or concerns about the content of this note or information contained within the body of this dictation they should be addressed directly with the author for clarification

## 2017-06-03 NOTE — UM Notes (Signed)
[facility] Utilization Review   NPI [id number], Tax ID [id number]  Please call Billey Chang @ [phone] with any questions or concerns.  Email:  [email or web address]  Fax final authorization and requests for additional information to [phone]    IP admission 2/28    84 year old [place] speaking male who presents with complaints of weakness. The patient's grandson is at the bedside who is translating for him.  The patient tells me that he has been feeling poorly for approximately one month with a dry cough.  He has seen his primary care physician for this, has had outpatient x-rays which appear normal.  He reports wheezing but denies SOB.  Approximately one week ago he developed stiffness from his hip to his knees.  This morning he had difficulty walking.  2 days ago the patient vomited 2 with complaints of nausea.      The patient denies dietary indiscretion and tells me that he drinks about 32 ounces of water per day.  He has not missed any medications and there are no new medications.     Past Medical History:   Diagnosis Date   . Acute diastolic heart failure 42/70/6237   . Acute on chronic diastolic congestive heart failure 08/13/2016   . Acute pulmonary edema 05/26/2015   . ARF (acute renal failure) 05/26/2015   . Chronic diastolic heart failure 62/83/1517   . Chronic gout    . Chronic pain syndrome 07/07/2015   . CKD (chronic kidney disease) stage 4, GFR 15-29 ml/min     2018 baseline creatinine in EPIC is 1.5-2.3.   . Congestive heart failure (CHF)     EF 40% 5/18 echo, on lasix   . Current chronic use of systemic steroids     For Polymyalgia rheumatica   . Essential hypertension    . Mixed hyperlipidemia    . NSTEMI (non-ST elevated myocardial infarction) 08/13/2016   . Osteoarthritis of both knees    . Peripheral edema 06/16/2015   . Pneumonia due to infectious organism, unspecified laterality, unspecified part of lung 06/25/2015   . Polymyalgia rheumatica     chronic steroids   .  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 Second degree heart block by electrocardiogram (ECG) 04/27/2015   . Type 2 diabetes mellitus with complication, with long-term current use of insulin     12/01/2016 Hemoglobin A1c 8.2%     Past Surgical History:   Procedure Laterality Date   . ABDOMINAL SURGERY     . CHOLECYSTECTOMY     . PACEMAKER Left 2017     V/S;  T 98.4, p 69, 02 98%, R20, BP 147/63, pain 10/10    Abn labs;  hgb 11.6, hct 32.4, RBC 3.73, Cr 1.6, Na 122, Cl 90, Sed rate 28, BNP 411.1    Rapid flu a/b negative    URine;  wnl    Chest xray:  Stable prominent heart    HIp Right:  wnl    EKG: ventricular-paced complexes  LEFT AXIS DEVIATION  ABNORMAL ECG    ED meds:  Tylenol 650 mg po, duoneb 3 ml, breo ellipta 1 puff, lasix 40 mg IV    Patient admitted to Med/Surg Unit as iP    Shortness of breath, most likely secondary to acute on chronic diastolic heart failure  Last ejection fraction 40 percent in May 2018  [place] cardiology input  IV diuretics with Lasix, received dose in the ER moniter sodium and redose accordingly   Echo defer to  Cardiology    Hyponatremia could be from CHF  moniter clsoley   Consulted Dr,.Assefi  Hold chlorthalidone    CKD stage III, followed by Dr.Assefi creatinine baseline    Cough, scattered wheezing.  Chest x-ray negative.  Check pro calcitonin  Most likely secondary to underlying congestive heart failure    Status post pacemaker in November 2017    Hypertension on hydralazine, Toprol, Procardia and valsartan at home and continue    Type 2 diabetes continue Lantus and SSI    Moderate to severe mitral valve regurgitation    History of polymyalgia rheumatica.  Continue prednisone  X-ray of the hip and the pelvis, no acute fractures.  She was osteoarthritis  PT, OT consult  DVT Prohylaxis:heparin SQ    Cardio NOTES:  Assessment:     1.         Acute on chronic diastolic congestive heart failure.                            a.         Left ventricular ejection fraction 75%  by echocardiogram in 2017, reduced to                                       40% by echocardiogram in May of 2018.  2.         Moderate to severe valvular disease.                          a.         Moderate to severe mitral valve regurgitation.                          b.         Mild aortic valve regurgitation.                          c.         Pulmonary artery pressure 56 mmHg in May 2018.  3.         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  4.         Chronic kidney disease.  5.         Hypertension.  6.         Diabetes.  7.         Anemia.  8.         Former tobacco use.  9.         Hyponatremia    Recommendations:    Diurese with IV lasix   Continue cardiac meds   Daily standing weight and strict I/O.   Was due for outpatient echo and f/u for MR.  If he does not have a quick turn around, will do echo in the hospital.    NEPHROLOGY NOTES:  Assessment:   Hyponatremia   CHF   DM with CKD   Hypertension with CKD   Anemia   CKD III    Plan:   D/C IV fluid   Lasix 40 mg IV daily   Salt and fluid restriction   Serial labs

## 2017-06-04 ENCOUNTER — Inpatient Hospital Stay: Payer: Medicare Other

## 2017-06-04 DIAGNOSIS — R509 Fever, unspecified: Secondary | ICD-10-CM

## 2017-06-04 DIAGNOSIS — R700 Elevated erythrocyte sedimentation rate: Secondary | ICD-10-CM

## 2017-06-04 LAB — COMPREHENSIVE METABOLIC PANEL
ALT: 7 U/L (ref 0–55)
AST (SGOT): 21 U/L (ref 5–34)
Albumin/Globulin Ratio: 1.7 (ref 0.9–2.2)
Albumin: 3.8 g/dL (ref 3.5–5.0)
Alkaline Phosphatase: 54 U/L (ref 38–106)
Anion Gap: 8 (ref 5.0–15.0)
BUN: 22.2 mg/dL (ref 9.0–28.0)
Bilirubin, Total: 0.5 mg/dL (ref 0.2–1.2)
CO2: 26 mEq/L (ref 22–29)
Calcium: 9.4 mg/dL (ref 7.9–10.2)
Chloride: 91 mEq/L — ABNORMAL LOW (ref 100–111)
Creatinine: 2.1 mg/dL — ABNORMAL HIGH (ref 0.7–1.3)
Globulin: 2.3 g/dL (ref 2.0–3.6)
Glucose: 137 mg/dL — ABNORMAL HIGH (ref 70–100)
Potassium: 4.8 mEq/L (ref 3.5–5.1)
Protein, Total: 6.1 g/dL (ref 6.0–8.3)
Sodium: 125 mEq/L — ABNORMAL LOW (ref 136–145)

## 2017-06-04 LAB — CBC AND DIFFERENTIAL
Absolute NRBC: 0 10*3/uL (ref 0.00–0.00)
Basophils Absolute Automated: 0.04 10*3/uL (ref 0.00–0.08)
Basophils Automated: 0.5 %
Eosinophils Absolute Automated: 1.49 10*3/uL — ABNORMAL HIGH (ref 0.00–0.44)
Eosinophils Automated: 17.1 %
Hematocrit: 31.6 % — ABNORMAL LOW (ref 37.6–49.6)
Hgb: 11.1 g/dL — ABNORMAL LOW (ref 12.5–17.1)
Immature Granulocytes Absolute: 0.02 10*3/uL (ref 0.00–0.07)
Immature Granulocytes: 0.2 %
Lymphocytes Absolute Automated: 1.53 10*3/uL (ref 0.42–3.22)
Lymphocytes Automated: 17.6 %
MCH: 30.8 pg (ref 25.1–33.5)
MCHC: 35.1 g/dL (ref 31.5–35.8)
MCV: 87.8 fL (ref 78.0–96.0)
MPV: 9.7 fL (ref 8.9–12.5)
Monocytes Absolute Automated: 0.94 10*3/uL — ABNORMAL HIGH (ref 0.21–0.85)
Monocytes: 10.8 %
Neutrophils Absolute: 4.69 10*3/uL (ref 1.10–6.33)
Neutrophils: 53.8 %
Nucleated RBC: 0 /100 WBC (ref 0.0–0.0)
Platelets: 170 10*3/uL (ref 142–346)
RBC: 3.6 10*6/uL — ABNORMAL LOW (ref 4.20–5.90)
RDW: 13 % (ref 11–15)
WBC: 8.71 10*3/uL (ref 3.10–9.50)

## 2017-06-04 LAB — URIC ACID: Uric acid: 7.6 mg/dL (ref 3.6–9.7)

## 2017-06-04 LAB — GLUCOSE WHOLE BLOOD - POCT
Whole Blood Glucose POCT: 145 mg/dL — ABNORMAL HIGH (ref 70–100)
Whole Blood Glucose POCT: 147 mg/dL — ABNORMAL HIGH (ref 70–100)
Whole Blood Glucose POCT: 218 mg/dL — ABNORMAL HIGH (ref 70–100)
Whole Blood Glucose POCT: 141 mg/dL — ABNORMAL HIGH (ref 70–100)
Whole Blood Glucose POCT: 155 mg/dL — ABNORMAL HIGH (ref 70–100)

## 2017-06-04 LAB — LACTIC ACID, PLASMA: Lactic Acid: 0.6 mmol/L (ref 0.2–2.0)

## 2017-06-04 LAB — C-REACTIVE PROTEIN: C-Reactive Protein: 5.9 mg/dL — ABNORMAL HIGH (ref 0.0–0.8)

## 2017-06-04 LAB — SEDIMENTATION RATE: Sed Rate: 35 mm/Hr — ABNORMAL HIGH (ref 0–15)

## 2017-06-04 LAB — GFR: EGFR: 29.9

## 2017-06-04 MED ORDER — CEFTRIAXONE SODIUM 1 G IJ SOLR
1.00 g | INTRAMUSCULAR | Status: DC
Start: 2017-06-04 — End: 2017-06-07
  Administered 2017-06-04 – 2017-06-06 (×3): 1 g via INTRAVENOUS
  Filled 2017-06-04 (×5): qty 1000

## 2017-06-04 MED ORDER — HYDRALAZINE HCL 50 MG PO TABS
50.00 mg | ORAL_TABLET | Freq: Three times a day (TID) | ORAL | Status: DC
Start: 2017-06-04 — End: 2017-06-06
  Administered 2017-06-04 – 2017-06-05 (×5): 50 mg via ORAL
  Filled 2017-06-04 (×6): qty 1

## 2017-06-04 MED ORDER — FUROSEMIDE 10 MG/ML IJ SOLN
40.00 mg | Freq: Every day | INTRAMUSCULAR | Status: DC
Start: 2017-06-04 — End: 2017-06-05
  Administered 2017-06-04 – 2017-06-05 (×2): 40 mg via INTRAVENOUS
  Filled 2017-06-04 (×2): qty 4

## 2017-06-04 MED ORDER — IBUPROFEN 400 MG PO TABS
400.00 mg | ORAL_TABLET | Freq: Once | ORAL | Status: DC
Start: 2017-06-04 — End: 2017-06-04

## 2017-06-04 MED ORDER — AZITHROMYCIN 250 MG PO TABS
500.00 mg | ORAL_TABLET | Freq: Every day | ORAL | Status: DC
Start: 2017-06-04 — End: 2017-06-07
  Administered 2017-06-04 – 2017-06-06 (×3): 500 mg via ORAL
  Filled 2017-06-04 (×3): qty 2

## 2017-06-04 NOTE — Consults (Signed)
INFECTIOUS DISEASE CONSULT  Serafina Royals, MD, FACP          Date Time: 06/04/17 7:03 PM  Patient Name: Marco Bruce  Referring Physician: Clayborne Artist, MD      Reason for consult:     Systemic inflammatory response syndrome; Electrolyte imbalance; joint pains    History of present illness:     Marco Bruce CSN:[id number],[id number] is a 84 y.o. male, whose history is obtained from the records and family as he is not providing any history at present. According to the records, he has a history of congestive heart failure, coronary artery disease, chronic kidney disease, atrial fibrillation, status post pacemaker placement, hypertension, diabetes mellitus, anemia, polymyalgia rheumatica, gout, admitted with fevers, chills, weakness, malaise, dry cough, wheezing, multiple joint pains, inability to walk, episodes of nausea, vomiting. No relieving factors. He is still spiking, fevers. Complains of multiple joint pains, 9/10, constant, sharp, no relieving factors, worsens with movements. Denies any hematemesis, hemoptysis, melena. No seizure activity    Review of systems:     Constitutional:   Complains of fevers, chills, malaise/fatigue.  HEENT: Denies any double vision, photophobia, pain, discharge and redness.   Respiratory:  Complains of cough, and wheezing.   Cardiovascular: Denies any chest pain, palpitations, orthopnea, claudication.  Gastrointestinal: Denies any heartburn, nausea, vomiting, abdominal pain, diarrhea, constipation, blood in stool and melena.   Genitourinary: Denies any dysuria, urgency, frequency, hematuria and flank pain.   Musculoskeletal:  Complains of myalgias, back pain, joint pain  Skin: Denies any itching and rash.   Neurological:  Complains of generalized weakness, malaise, difficult to ambulate   Endo/Heme/Allergies: Denies any environmental allergies and polydipsia. Does not bruise/bleed easily.   Psychiatric/Behavioral:  Anxious and nervous  Other review of systems are  noncontributory.    Allergies:     No Known Allergies    Past medical history:     Past Medical History:   Diagnosis Date   . Acute diastolic heart failure 49/17/9150   . Acute on chronic diastolic congestive heart failure 08/13/2016   . Acute pulmonary edema 05/26/2015   . ARF (acute renal failure) 05/26/2015   . Chronic diastolic heart failure 56/97/9480   . Chronic gout    . Chronic pain syndrome 07/07/2015   . CKD (chronic kidney disease) stage 4, GFR 15-29 ml/min     2018 baseline creatinine in EPIC is 1.5-2.3.   . Congestive heart failure (CHF)     EF 40% 5/18 echo, on lasix   . Current chronic use of systemic steroids     For Polymyalgia rheumatica   . Essential hypertension    . Mixed hyperlipidemia    . NSTEMI (non-ST elevated myocardial infarction) 08/13/2016   . Osteoarthritis of both knees    . Peripheral edema 06/16/2015   . Pneumonia due to infectious organism, unspecified laterality, unspecified part of lung 06/25/2015   . Polymyalgia rheumatica     chronic steroids   . 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 Second degree heart block by electrocardiogram (ECG) 04/27/2015   . Type 2 diabetes mellitus with complication, with long-term current use of insulin     12/01/2016 Hemoglobin A1c 8.2%       Past surgical history:     Past Surgical History:   Procedure Laterality Date   . ABDOMINAL SURGERY     . CHOLECYSTECTOMY     . PACEMAKER Left 2017       Family history:     History reviewed. No pertinent  family history.    Social history:     History   Alcohol Use No     Comment: former alcoholic     History   Drug Use No     History   Smoking Status   . Never Smoker   Smokeless Tobacco   . Never Used       Medications:     Current Facility-Administered Medications   Medication Dose Route Frequency   . albuterol-ipratropium  3 mL Nebulization Q6H [place]   . allopurinol  100 mg Oral Daily   . atorvastatin  20 mg Oral Daily   . azithromycin  500 mg Oral Daily   . cefTRIAXone  1 g Intravenous Q24H   . fluticasone  furoate-vilanterol  1 puff Inhalation QAM   . furosemide  40 mg Intravenous Daily   . gabapentin  100 mg Oral TID   . hydrALAZINE  50 mg Oral Q8H [place]   . influenza  0.5 mL Intramuscular Once   . insulin glargine  10 Units Subcutaneous QHS   . insulin lispro  1-3 Units Subcutaneous QHS   . insulin lispro  1-5 Units Subcutaneous TID AC   . metoprolol succinate XL  25 mg Oral Daily   . NIFEdipine  60 mg Oral QPM   . pantoprazole  40 mg Oral QAM AC       Physical Exam:     Blood pressure 156/71, pulse 80, temperature 99.7 F (37.6 C), temperature source Oral, resp. rate 16, height 1.575 m (5\' 2" ), weight 74.8 kg (164 lb 12.8 oz), SpO2 97 %.    General Appearance:  Sick-looking.   HEENT: Pallor positive, Anicteric sclera.   Neck: Supple  Lungs:  Bilateral crackles.   Chest Wall: Symmetric chest wall expansion.   Heart : S1 and S2.   Abdomen: Abdomen is soft. There are no signs of ascites. Bowel sounds are normal. There is no abdominal tenderness. There is no mass. There is no splenomegaly or hepatomegaly.  Neurological:  Sleepy and lethargic. At present.   Extremities: Multiple joint pains, inability to bend knees, especially the left; decreased range of motion   Psychiatric:  Anxious     Labs:     Recent Labs      06/04/17   0623  06/03/17   0627   WBC  8.71  7.10   Hgb  11.1*  9.9*   Hematocrit  31.6*  28.7*   Platelets  170  157   MCV  87.8  86.4       Recent Labs      06/04/17   0623  06/03/17   0627   06/02/17   1155   Sodium  125*  123*   < >  122*   Potassium  4.8  4.4   < >  4.2   Chloride  91*  91*   < >  90*   CO2  26  25   < >  25   BUN  22.2  19.5   < >  19.1   Creatinine  2.1*  1.7*   < >  1.6*   Glucose  137*  116*   < >  87   Calcium  9.4  9.1   < >  9.2   Magnesium   --    --    --   1.8   Phosphorus   --    --    --   3.8    < > =  values in this interval not displayed.       Recent Labs      06/04/17   0623  06/03/17   0627   AST (SGOT)  21  19   ALT  7  7   Alkaline Phosphatase  54  48    Protein, Total  6.1  5.4*   Albumin  3.8  3.5   Bilirubin, Total  0.5  0.3       Imaging studies:     X-ray: Clear lungs. Unchanged mild cardiac enlargement.    Assessment :     Marco Bruce is a 84 y.o. male, with:     Systemic inflammatory response syndrome   Possible acute viral syndrome   Acute gouty arthropathy   Electrolyte imbalance   Congestive heart failure   Hypertension   Diabetes mellitus   Mitral regurgitation   Polymyalgia rheumatica   Chronic kidney disease   Status post pacemaker placement   Coronary artery disease   Gout     Recommendations:     I would like to suggest following approach:     Rocephin 1 g IV q. Daily   Zithromax 500 mg q. Daily   Prednisone 30 mg b.i.d.   Repeat chest x-ray tomorrow   Respiratory virus PCR   Repeat CBC, CMP tomorrow    We'll follow cultures   Repeat blood cultures if spikes more than 100.5    We'll adjust the antimicrobials according to the cultures and clinical course     I will follow this patient closely with you    Thank you Radhika for involving me in care of Marco Bruce by: Serafina Royals, MD, FACP  Date Time: 06/04/17 7:03 PM      *This note was generated by the Epic EMR system/ Dragon speech recognition and may contain inherent errors or omissions not intended by the user. Grammatical errors, random word insertions, deletions, pronoun errors and incomplete sentences are occasional consequences of this technology due to software limitations. Not all errors are caught or corrected. If there are questions or concerns about the content of this note or information contained within the body of this dictation they should be addressed directly with the author for clarification

## 2017-06-04 NOTE — Progress Notes (Signed)
[facility].  Progress Note    Assessment:   AKI - Cardiorenal syndrome   CKD Stage 3   Hyponatremia - Hypervolemic    Acute on chronic diastolic heart failure   Moderate to severe mitral valve regurgitation   Pacemaker   DM 2 with CKD   Hypertension with CKD   Anemia CKD    Plan:   Continue diuretics despite rise in creatinine   Can hold sodium bicarbonate   Hold Valsartan    [facility] Dowse, MD  Office - [phone]  ++++++++++++++++++++++++++++++++++++++++++++++++++++++++++++++  Subjective:  Sleeping    Medications:  Scheduled Meds:  Current Facility-Administered Medications   Medication Dose Route Frequency   . albuterol-ipratropium  3 mL Nebulization Q6H [place]   . allopurinol  100 mg Oral Daily   . atorvastatin  20 mg Oral Daily   . azithromycin  500 mg Oral Daily   . cefTRIAXone  1 g Intravenous Q24H   . fluticasone furoate-vilanterol  1 puff Inhalation QAM   . furosemide  40 mg Intravenous Daily   . gabapentin  100 mg Oral TID   . hydrALAZINE  50 mg Oral Q8H [place]   . influenza  0.5 mL Intramuscular Once   . insulin glargine  10 Units Subcutaneous QHS   . insulin lispro  1-3 Units Subcutaneous QHS   . insulin lispro  1-5 Units Subcutaneous TID AC   . metoprolol succinate XL  25 mg Oral Daily   . NIFEdipine  60 mg Oral QPM   . pantoprazole  40 mg Oral QAM AC   . sodium bicarbonate  1,300 mg Oral BID   . valsartan  80 mg Oral Daily     Continuous Infusions:  PRN Meds:acetaminophen **OR** acetaminophen, albuterol, Nursing communication: Adult Hypoglycemia Treatment Algorithm **AND** dextrose **AND** dextrose **AND** glucagon (rDNA), naloxone, ondansetron **OR** ondansetron    Objective:  Vital signs in last 24 hours:  Temp:  [98.6 F (37 C)-102.7 F (39.3 C)] 99.1 F (37.3 C)  Heart Rate:  [64-84] 81  Resp Rate:  [16-20] 16  BP: (125-173)/(66-77) 142/68  Intake/Output last 24 hours:    Intake/Output Summary (Last 24 hours) at 06/04/17 1721  Last data filed at  06/04/17 1235   Gross per 24 hour   Intake              100 ml   Output              400 ml   Net             -300 ml     Intake/Output this shift:  I/O this shift:  In: 100 [P.O.:100]  Out: -     Physical Exam:   Gen: WD WN NAD   CV: S1 S2 N RRR   Chest: +crackles   Ab: ND NT soft no HSM +BS   Ext: +edema    Labs:    Recent Labs  Lab 06/04/17  0623 06/03/17  0627 06/02/17  1614 06/02/17  1155   Glucose 137* 116* 78 87   BUN 22.2 19.5 17.4 19.1   Creatinine 2.1* 1.7* 1.5* 1.6*   Calcium 9.4 9.1 8.7 9.2   Sodium 125* 123* 121* 122*   Potassium 4.8 4.4 3.7 4.2   Chloride 91* 91* 92* 90*   CO2 26 25 23 25    Albumin 3.8 3.5  --  4.0   Phosphorus  --   --   --  3.8   Magnesium  --   --   --  1.8       Recent Labs  Lab 06/04/17  0623 06/03/17  0627 06/02/17  1155   WBC 8.71 7.10 8.72   Hgb 11.1* 9.9* 11.6*   Hematocrit 31.6* 28.7* 32.4*   MCV 87.8 86.4 86.9   MCH 30.8 29.8 31.1   MCHC 35.1 34.5 35.8   RDW 13 13 13    MPV 9.7 9.6 9.8   Platelets 170 157 158

## 2017-06-04 NOTE — Progress Notes (Addendum)
[place]    Date Time: 06/04/17 8:53 AM  Patient Name: [name]       Patient Active Problem List   Diagnosis   . Stage 4 chronic kidney disease   . Renovascular hypertension   . Hyponatremia   . Essential hypertension   . Acute on chronic diastolic congestive heart failure   . Hypokalemia   . Mixed hyperlipidemia   . Type 2 diabetes mellitus with complication, with long-term current use of insulin   . Acute cystitis without hematuria   . Generalized weakness       Assessment:      Acute on chronic diastolic congestive heart failure.     Left ventricular ejection fraction 75% by echocardiogram in 2017, reduced to 40% by echocardiogram in May of 2018.   Moderate to severe valvular disease.    Moderate to severe mitral valve regurgitation.    Mild aortic valve regurgitation.    Pulmonary artery pressure 56 mmHg in May 2018.    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   Acute on chronic kidney disease.   Hypertension.    Diabetes.    Anemia.    Former tobacco use.    Hyponatremia   + Fever and vomiting, workup per primary team.        Recommendations:    Creatinine increasing   Needs more IV diuresis, will start 40 mg IV lasix qd and trend BMPs   Will increase hydralazine for better BP control.   Fever with nausea and vomiting-may need blood cultures      Medications:      Scheduled Meds: PRN Meds:        albuterol-ipratropium 3 mL Nebulization Q6H SCH   allopurinol 100 mg Oral Daily   atorvastatin 20 mg Oral Daily   fluticasone furoate-vilanterol 1 puff Inhalation QAM   gabapentin 100 mg Oral TID   hydrALAZINE 50 mg Oral QAM   influenza 0.5 mL Intramuscular Once   insulin glargine 10 Units Subcutaneous QHS   insulin lispro 1-3 Units Subcutaneous QHS   insulin lispro 1-5 Units Subcutaneous TID AC   metoprolol succinate XL 25 mg Oral Daily   NIFEdipine 60 mg Oral QPM   pantoprazole 40 mg Oral QAM AC   sodium bicarbonate 1,300 mg Oral BID   valsartan 80 mg  Oral Daily       Continuous Infusions:     acetaminophen 650 mg Q4H PRN   Or     acetaminophen 650 mg Q4H PRN   albuterol 2 puff Q4H PRN   dextrose 15 g of glucose PRN   And     dextrose 12.5 g PRN   And     glucagon (rDNA) 1 mg PRN   naloxone 0.2 mg PRN   ondansetron 4 mg Q6H PRN   Or     ondansetron 4 mg Q6H PRN             Subjective:    Non english speaking, wife translates. Pt not speaking but responds to commands.       Physical Exam:     Vitals:    06/04/17 0823   BP:    Pulse:    Resp:    Temp: (!) 102.7 F (39.3 C)   SpO2:      Temp (24hrs), Avg:99.7 F (37.6 C), Min:98.1 F (36.7 C), Max:102.7 F (39.3 C)      Telemetry reviewed no changes. : paced  Intake and Output Summary (Last 24 hours) at Date Time    Intake/Output Summary (Last 24 hours) at 06/04/17 0853  Last data filed at 06/04/17 0610   Gross per 24 hour   Intake              240 ml   Output              900 ml   Net             -660 ml       General Appearance:  Breathing comfortable  Head:  normocephalic  Eyes:  EOM's intact  Neck:  No carotid bruit; +jugular venous distension, brisk carotid upstroke  Lungs:  + bilateral crackles, normal resp effort  Chest Wall:  No tenderness or deformity  Heart:  S1, S2 normal, no S3, no S4, 2/6 systolic murmur, PMI not displaced, no rub   Abdomen:  Soft, non-tender, positive bowel sounds  Extremities:  No cyanosis or clubbing; 1+ edema  Pulses:  Equal radial pulses, 4/4 symmetric  Neurologic:  Affect wnl  Musculoskeletal: normal strength and tone    Labs:       Recent Labs  Lab 06/02/17  1928 06/02/17  1614 06/02/17  1155 06/02/17  1154   Creatine Kinase (CK)  --   --   --  156   Troponin I 0.02 0.02 0.01  --                Recent Labs  Lab 06/04/17  0623   Bilirubin, Total 0.5   Protein, Total 6.1   Albumin 3.8   ALT 7   AST (SGOT) 21       Recent Labs  Lab 06/02/17  1155   Magnesium 1.8           Recent Labs  Lab 06/04/17  0623 06/03/17  0627 06/02/17  1155   WBC 8.71 7.10 8.72   Hgb 11.1* 9.9* 11.6*    Hematocrit 31.6* 28.7* 32.4*   Platelets 170 157 158       Recent Labs  Lab 06/04/17  0623 06/03/17  0627 06/02/17  1614   Sodium 125* 123* 121*   Potassium 4.8 4.4 3.7   Chloride 91* 91* 92*   CO2 26 25 23    BUN 22.2 19.5 17.4   Creatinine 2.1* 1.7* 1.5*   EGFR 29.9 38.2 44.1   Glucose 137* 116* 78   Calcium 9.4 9.1 8.7       Recent Labs  Lab 06/02/17  1154   TSH 2.54       .  Lab Results   Component Value Date    BNP 411.1 (H) 06/02/2017      Estimated Creatinine Clearance: 21.6 mL/min (A) (based on SCr of 2.1 mg/dL (H)).    Weight Monitoring 05/28/2017 06/02/2017 06/02/2017 06/03/2017 06/03/2017 06/03/2017 06/04/2017   Height - 157.5 cm - - - 157.5 cm -   Height Method - Stated - - - - -   Weight 77.111 kg 72.576 kg 76.522 kg 76.613 kg 76.2 kg - 74.753 kg   Weight Method - Stated Bed Scale Bed Scale Standing Scale - Bed Scale   BMI (calculated) - 29.3 kg/m2 - - - - -         Imaging:   Xr Chest 2 Views    Result Date: 06/02/2017   Stable prominent heart. Darnelle Bos, MD 06/02/2017 1:01 PM    X-ray Chest Pa And Lateral  Result Date: 05/28/2017   Stable elevation of the right hemidiaphragm. No acute disease. Elwanda Brooklyn, MD 05/28/2017 12:01 PM    Xr Knee 3 View Left    Result Date: 06/03/2017  1. No acute findings or evidence of erosion. 2. Tri compartment degenerative arthritis greatest medially. Moderate joint effusion. Lupita Leash, MD 06/03/2017 3:45 PM    Hip Right Ap And Lateral With Pelvis    Result Date: 06/02/2017   No evidence of fracture. Darnelle Bos, MD 06/02/2017 1:00 PM                  Signed by: Marco [facility], [place]  NP [place] (8am-5pm)  MD Spectralink [phone] (8am-5pm)  After hours, non urgent consult line [phone]  After Hours, urgent consults [phone]        Patient seen and examined, my assessment and plan as above. Lungs are clear. Spoke with family at bedside.    Brunetta Genera MD [facility].

## 2017-06-04 NOTE — Plan of Care (Signed)
Problem: Safety  Goal: Patient will be free from injury during hospitalization  Outcome: Progressing   06/04/17 1525   Goal/Interventions addressed this shift   Patient will be free from injury during hospitalization  Assess patient's risk for falls and implement fall prevention plan of care per policy;Provide and maintain safe environment;Use appropriate transfer methods;Ensure appropriate safety devices are available at the bedside;Include patient/ family/ care giver in decisions related to safety;Hourly rounding     Goal: Patient will be free from infection during hospitalization  Outcome: Progressing   06/04/17 1525   Goal/Interventions addressed this shift   Free from Infection during hospitalization Assess and monitor for signs and symptoms of infection;Monitor lab/diagnostic results       Problem: Pain  Goal: Pain at adequate level as identified by patient  Outcome: Progressing   06/04/17 1525   Goal/Interventions addressed this shift   Pain at adequate level as identified by patient Identify patient comfort function goal;Assess for risk of opioid induced respiratory depression, including snoring/sleep apnea. Alert healthcare team of risk factors identified.;Assess pain on admission, during daily assessment and/or before any "as needed" intervention(s);Reassess pain within 30-60 minutes of any procedure/intervention, per Pain Assessment, Intervention, Reassessment (AIR) Cycle;Evaluate if patient comfort function goal is met;Evaluate patient's satisfaction with pain management progress;Offer non-pharmacological pain management interventions;Consult/collaborate with Pain Service       Problem: Psychosocial and Spiritual Needs  Goal: Demonstrates ability to cope with hospitalization/illness  Outcome: Progressing   06/04/17 1525   Goal/Interventions addressed this shift   Demonstrates ability to cope with hospitalizations/illness Encourage verbalization of feelings/concerns/expectations;Provide quiet  environment;Assist patient to identify own strengths and abilities;Encourage patient to set small goals for self;Encourage participation in diversional activity;Reinforce positive adaptation of new coping behaviors;Include patient/ patient care companion in decisions

## 2017-06-04 NOTE — Progress Notes (Signed)
[facility]  Progress Note  Patient Info:   Date/Time: 06/04/2017 / 2:45 PM   Admit Date:06/02/2017  Patient Name:Marco Bruce   ZCH:[id number]   PCP: Darlina Sicilian June, DO  Attending Physician:Keveon Amsler, Hart [place], MD     Assessment and Plan:   84 year old combodiam speaking male with history of polymyalgia rheumatica on chronic prednisone presented with generalized weakness, bilateral hip pain.    1.  Acute on chronic diastolic congestive heart failure, ejection fraction of 40 percent  --Today he has minimal crackles in both lung fields, cont  IV Lasix daily 40 mg   Monitor cr while diuresing   --continue strict I's and O's, daily weights, fluid restriction, salt restriction  --continue  metoprolol 25 mg by mouth daily, Lipitor    2.  Fevers, pro-calcitonin 0.19 , elevated ESR, C-reactive protein  Chronic cough with minimal wheezing on admission   Updated blood cultures, chest x-ray neg   Patient does not have any cough, shortness of breath, less likely pneumonia, no evidence of aspiration.  Urine analysis negative  Influenza flu test negative  Pt has knee effusion, needs to rule out septic arthritis  Will cover with empiric  antibiotics, ceftriaxone, azithromycin  Requested ID, Dr. Gustavus Messing  Consultation  Continue albuterol duo nebulizerswhen necessary, continue by mouth, might need pulmonary function tests as outpatient      2.hyponatremia improved to 125    --Most likely from congestive heart failure  --Nephrology on board.  Continue to hold off on chlorthalidone    3. Acute on Chronic kidney disease stage III  Cr 2.1 today ,monitor while diuresing   Followed by nephrology    4.  Left knee severe pain  Patient has bilateral hip pain which is chronic, xrays  showed no acute fracture or dislocation  X-ray of the knee, but most likely secondary to his underlying polymyalgia rheumatica with osteoarthritis  Xray knee showed moderate effusion   Patient states his knee got locked up,unable to ambulate and is spiking  fevers   Requested ortho evaluation to r/o septic arthritis   Continue PT, OT      4.  History of hypertension on home medications hydralazine, Toprol, Procardia, losartan  5.  Type 2 diabetes mellitus on Lantus and sliding scale insulin  6.  Moderate to severe mitral wall regurgitation  7.  History of gout, on outpatient allopurinol  8.  Chronic normocytic anemia, stable  9.  History of polymyalgia rheumatica, on outpatient prednisone    Plan of care discussed with the patient, nursing staff, patient's grandson and son      DVT Prohylaxis:scd  Central Line/Foley Catheter/PICC line status: none  Code Status: Full Code  Disposition: home   Type of Admission:Inpatient  Expected Date of Discharge: 2-3 days   Milestones required for discharge:see as above   Hospital Problems:   Active Problems:    Hyponatremia    Subjective:   06/04/17   Seen and examined this morning  Patient's grandson bedside to interpret camb[place]ian language   Denies  nausea, vomiting, chest pain, shortness of breath  Spiking fevers 102  Complaints of left knee pain.  States knocked out and unable to ambulate  Right knee pain present but chronic  unable to ambulate with physical therapy  Chief Complaint:  Hip Pain; Fever; and Joint Pain    Review of Systems   Constitutional: Positive for malaise/fatigue. Negative for chills, diaphoresis, fever and weight loss.   HENT: Negative for congestion, ear discharge, ear pain, hearing  loss, nosebleeds and tinnitus.    Eyes: Negative for blurred vision, double vision, photophobia, pain and discharge.   Respiratory: Positive for cough and wheezing. Negative for hemoptysis, sputum production and shortness of breath.    Cardiovascular: Negative for chest pain, palpitations, orthopnea, claudication, leg swelling and PND.   Gastrointestinal: Negative for abdominal pain, constipation, diarrhea, heartburn, nausea and vomiting.   Genitourinary: Negative for dysuria, frequency, hematuria and urgency.   Musculoskeletal:  Positive for back pain and joint pain. Negative for falls, myalgias and neck pain.   Skin: Negative for rash.   Neurological: Positive for weakness. Negative for dizziness, tingling, tremors, sensory change and headaches.   Psychiatric/Behavioral: Negative for depression, hallucinations, substance abuse and suicidal ideas. The patient is nervous/anxious.      Objective:     Vitals:    06/04/17 0857 06/04/17 0958 06/04/17 1235 06/04/17 1356   BP: 146/69 125/67 132/66 142/68   Pulse: 82 84 82 81   Resp: 20  16    Temp: (!) 101.5 F (38.6 C)  99.1 F (37.3 C)    TempSrc: Axillary  Oral    SpO2: 96%  98%    Weight:       Height:         Physical Exam:   Physical Exam   patient is awake, alert, oriented 3, not in acute distress.  HEENT atraumatic, normocephalic.  Pupils equal, round, reacting to light.  Extraocular movements intact.  Neck supple.  No thyromegaly.  Chest--crackles noted bilaterally at the bases.  CVS S1, S2 present.  No murmurs, rubs or gallops.  Abdomen soft, nontender, no organomegaly.  Bowel sounds present  Extremities noted to have mild bilateral lower extremity edema  Unable to bend left knee, no erythemaor tenderness noted on the anterior knee  Range of motion limited.  Skin no rashes.     neurologic no gross focal deficits  Results of Labs/imaging   Labs and radiology reports have been reviewed.    Hospitalist   Signed by:   Clayborne Artist  06/04/2017 2:45 PM    *This note was generated by the Epic EMR system/ Dragon speech recognition and may contain inherent errors or omissions not intended by the user. Grammatical errors, random word insertions, deletions, pronoun errors and incomplete sentences are occasional consequences of this technology due to software limitations. Not all errors are caught or corrected. If there are questions or concerns about the content of this note or information contained within the body of this dictation they should be addressed directly with the author for  clarification

## 2017-06-04 NOTE — Plan of Care (Signed)
Problem: Safety  Goal: Patient will be free from injury during hospitalization  Outcome: Progressing   06/04/17 2019   Goal/Interventions addressed this shift   Patient will be free from injury during hospitalization  Assess patient's risk for falls and implement fall prevention plan of care per policy;Provide and maintain safe environment;Use appropriate transfer methods;Ensure appropriate safety devices are available at the bedside;Include patient/ family/ care giver in decisions related to safety;Hourly rounding

## 2017-06-04 NOTE — Progress Notes (Signed)
Patient lethargic throughout the day. Family member at bedside. BA in use. Call bell made available. Patient care resumed per incoming nurse.

## 2017-06-05 ENCOUNTER — Inpatient Hospital Stay: Payer: Medicare Other

## 2017-06-05 DIAGNOSIS — M25561 Pain in right knee: Secondary | ICD-10-CM

## 2017-06-05 DIAGNOSIS — N183 Chronic kidney disease, stage 3 unspecified: Secondary | ICD-10-CM

## 2017-06-05 DIAGNOSIS — N189 Chronic kidney disease, unspecified: Secondary | ICD-10-CM

## 2017-06-05 DIAGNOSIS — R509 Fever, unspecified: Secondary | ICD-10-CM

## 2017-06-05 DIAGNOSIS — M109 Gout, unspecified: Secondary | ICD-10-CM

## 2017-06-05 LAB — CBC AND DIFFERENTIAL
Absolute NRBC: 0 10*3/uL (ref 0.00–0.00)
Basophils Absolute Automated: 0.04 10*3/uL (ref 0.00–0.08)
Basophils Automated: 0.5 %
Eosinophils Absolute Automated: 0.34 10*3/uL (ref 0.00–0.44)
Eosinophils Automated: 3.9 %
Hematocrit: 34.1 % — ABNORMAL LOW (ref 37.6–49.6)
Hgb: 11.7 g/dL — ABNORMAL LOW (ref 12.5–17.1)
Immature Granulocytes Absolute: 0.04 10*3/uL (ref 0.00–0.07)
Immature Granulocytes: 0.5 %
Lymphocytes Absolute Automated: 1.42 10*3/uL (ref 0.42–3.22)
Lymphocytes Automated: 16.3 %
MCH: 30.3 pg (ref 25.1–33.5)
MCHC: 34.3 g/dL (ref 31.5–35.8)
MCV: 88.3 fL (ref 78.0–96.0)
MPV: 10.3 fL (ref 8.9–12.5)
Monocytes Absolute Automated: 1.17 10*3/uL — ABNORMAL HIGH (ref 0.21–0.85)
Monocytes: 13.4 %
Neutrophils Absolute: 5.72 10*3/uL (ref 1.10–6.33)
Neutrophils: 65.4 %
Nucleated RBC: 0 /100 WBC (ref 0.0–0.0)
Platelets: 186 10*3/uL (ref 142–346)
RBC: 3.86 10*6/uL — ABNORMAL LOW (ref 4.20–5.90)
RDW: 13 % (ref 11–15)
WBC: 8.73 10*3/uL (ref 3.10–9.50)

## 2017-06-05 LAB — COMPREHENSIVE METABOLIC PANEL
ALT: 10 U/L (ref 0–55)
AST (SGOT): 25 U/L (ref 5–34)
Albumin/Globulin Ratio: 1.4 (ref 0.9–2.2)
Albumin: 3.7 g/dL (ref 3.5–5.0)
Alkaline Phosphatase: 48 U/L (ref 38–106)
Anion Gap: 15 (ref 5.0–15.0)
BUN: 29.4 mg/dL — ABNORMAL HIGH (ref 9.0–28.0)
Bilirubin, Total: 0.5 mg/dL (ref 0.2–1.2)
CO2: 23 mEq/L (ref 22–29)
Calcium: 9.3 mg/dL (ref 7.9–10.2)
Chloride: 93 mEq/L — ABNORMAL LOW (ref 100–111)
Creatinine: 2.8 mg/dL — ABNORMAL HIGH (ref 0.7–1.3)
Globulin: 2.7 g/dL (ref 2.0–3.6)
Glucose: 133 mg/dL — ABNORMAL HIGH (ref 70–100)
Potassium: 5 mEq/L (ref 3.5–5.1)
Protein, Total: 6.4 g/dL (ref 6.0–8.3)
Sodium: 131 mEq/L — ABNORMAL LOW (ref 136–145)

## 2017-06-05 LAB — RESPIRATORY PATHOGEN PANEL, PCR (FILMARRAY) (SOFT)
Adenovirus: NOT DETECTED
Bordetella pertussis: NOT DETECTED
Chlamydophila pneumoniae: NOT DETECTED
Coronavirus 229E: NOT DETECTED
Coronavirus HKU1: NOT DETECTED
Coronavirus NL63: NOT DETECTED
Coronavirus OC43: NOT DETECTED
Human Metapneumovirus: NOT DETECTED
Human Rhinovirus/Enterovirus: NOT DETECTED
Influenza A/H1: NOT DETECTED
Influenza A/H3: NOT DETECTED
Influenza A: NOT DETECTED
Influenza AH1 - 2009: NOT DETECTED
Influenza B: NOT DETECTED
Mycoplasma pneumoniae: NOT DETECTED
Parainfluenza Virus 1: NOT DETECTED
Parainfluenza Virus 2: NOT DETECTED
Parainfluenza Virus 3: NOT DETECTED
Parainfluenza Virus 4: NOT DETECTED
Respiratory Syncytial Virus: NOT DETECTED

## 2017-06-05 LAB — GLUCOSE WHOLE BLOOD - POCT
Whole Blood Glucose POCT: 162 mg/dL — ABNORMAL HIGH (ref 70–100)
Whole Blood Glucose POCT: 266 mg/dL — ABNORMAL HIGH (ref 70–100)
Whole Blood Glucose POCT: 345 mg/dL — ABNORMAL HIGH (ref 70–100)
Whole Blood Glucose POCT: 408 mg/dL — ABNORMAL HIGH (ref 70–100)

## 2017-06-05 LAB — GFR: EGFR: 21.5

## 2017-06-05 MED ORDER — PREDNISONE 10 MG PO TABS
30.00 mg | ORAL_TABLET | Freq: Two times a day (BID) | ORAL | Status: DC
Start: 2017-06-05 — End: 2017-06-07
  Administered 2017-06-05 – 2017-06-07 (×5): 30 mg via ORAL
  Filled 2017-06-05 (×9): qty 1

## 2017-06-05 MED ORDER — INFLUENZA VAC SPLIT HIGH-DOSE 0.5 ML IM SUSY
0.50 mL | PREFILLED_SYRINGE | Freq: Once | INTRAMUSCULAR | Status: AC
Start: 2017-06-06 — End: 2017-06-08
  Administered 2017-06-08: 11:00:00 0.5 mL via INTRAMUSCULAR
  Filled 2017-06-05: qty 0.5

## 2017-06-05 MED ORDER — HEPARIN SODIUM (PORCINE) 5000 UNIT/ML IJ SOLN
5000.00 [IU] | Freq: Two times a day (BID) | INTRAMUSCULAR | Status: DC
Start: 2017-06-05 — End: 2017-06-10
  Administered 2017-06-05 – 2017-06-10 (×10): 5000 [IU] via SUBCUTANEOUS
  Filled 2017-06-05 (×10): qty 1

## 2017-06-05 NOTE — Progress Notes (Signed)
[facility].  Progress Note    Assessment:   AKI - Cardiorenal syndrome   CKD Stage 3   Hyponatremia - Hypervolemic    Acute on chronic diastolic heart failure   Moderate to severe mitral valve regurgitation   Pacemaker   DM 2 with CKD   Hypertension with CKD   Anemia CKD    Plan:   Cr up to 2.8 mg/dl. Hold additional Lasix for today    Francis Dowse, MD  Office - [phone]  ++++++++++++++++++++++++++++++++++++++++++++++++++++++++++++++  Subjective:  Sleeping    Medications:  Scheduled Meds:  Current Facility-Administered Medications   Medication Dose Route Frequency   . albuterol-ipratropium  3 mL Nebulization Q6H [place]   . allopurinol  100 mg Oral Daily   . atorvastatin  20 mg Oral Daily   . azithromycin  500 mg Oral Daily   . cefTRIAXone  1 g Intravenous Q24H   . fluticasone furoate-vilanterol  1 puff Inhalation QAM   . furosemide  40 mg Intravenous Daily   . gabapentin  100 mg Oral TID   . hydrALAZINE  50 mg Oral Q8H [place]   . influenza  0.5 mL Intramuscular Once   . insulin glargine  10 Units Subcutaneous QHS   . insulin lispro  1-3 Units Subcutaneous QHS   . insulin lispro  1-5 Units Subcutaneous TID AC   . metoprolol succinate XL  25 mg Oral Daily   . NIFEdipine  60 mg Oral QPM   . pantoprazole  40 mg Oral QAM AC   . predniSONE  30 mg Oral BID Meals     Continuous Infusions:  PRN Meds:acetaminophen **OR** acetaminophen, albuterol, Nursing communication: Adult Hypoglycemia Treatment Algorithm **AND** dextrose **AND** dextrose **AND** glucagon (rDNA), naloxone, ondansetron **OR** ondansetron    Objective:  Vital signs in last 24 hours:  Temp:  [98.1 F (36.7 C)-102.3 F (39.1 C)] 100.4 F (38 C)  Heart Rate:  [75-86] 86  Resp Rate:  [16-20] 20  BP: (127-158)/(66-74) 158/71  Intake/Output last 24 hours:    Intake/Output Summary (Last 24 hours) at 06/05/17 1207  Last data filed at 06/05/17 0857   Gross per 24 hour   Intake              370 ml   Output                 0 ml   Net              370 ml     Intake/Output this shift:  I/O this shift:  In: 250 [P.O.:250]  Out: -     Physical Exam:   Gen: WD WN NAD   CV: S1 S2 N RRR   Chest: CTAB   Ab: ND NT soft no HSM +BS   Ext: trace edema    Labs:    Recent Labs  Lab 06/05/17  0524 06/04/17  0623 06/03/17  0627  06/02/17  1155   Glucose 133* 137* 116* More results in Results Review 87   BUN 29.4* 22.2 19.5 More results in Results Review 19.1   Creatinine 2.8* 2.1* 1.7* More results in Results Review 1.6*   Calcium 9.3 9.4 9.1 More results in Results Review 9.2   Sodium 131* 125* 123* More results in Results Review 122*   Potassium 5.0 4.8 4.4 More results in Results Review 4.2   Chloride 93* 91* 91* More results in Results Review 90*   CO2  23 26 25  More results in Results Review 25   Albumin 3.7 3.8 3.5  --  4.0   Phosphorus  --   --   --   --  3.8   Magnesium  --   --   --   --  1.8   More results in Results Review = values in this interval not displayed.    Recent Labs  Lab 06/05/17  0524 06/04/17  0623 06/03/17  0627   WBC 8.73 8.71 7.10   Hgb 11.7* 11.1* 9.9*   Hematocrit 34.1* 31.6* 28.7*   MCV 88.3 87.8 86.4   MCH 30.3 30.8 29.8   MCHC 34.3 35.1 34.5   RDW 13 13 13    MPV 10.3 9.7 9.6   Platelets 186 170 157

## 2017-06-05 NOTE — Progress Notes (Addendum)
[place]    Date Time: 06/05/17 9:52 AM  Patient Name: Marco [facility]       Patient Active Problem List   Diagnosis   . Stage 4 chronic kidney disease   . Renovascular hypertension   . Hyponatremia   . Essential hypertension   . Acute on chronic diastolic congestive heart failure   . Hypokalemia   . Mixed hyperlipidemia   . Type 2 diabetes mellitus with complication, with long-term current use of insulin   . Acute cystitis without hematuria   . Generalized weakness       Assessment:      Acute on chronic diastolic congestive heart failure.     Left ventricular ejection fraction 75% by echocardiogram in 2017, reduced to 40% by echocardiogram in May of 2018.   Moderate to severe valvular disease.    Moderate to severe mitral valve regurgitation.    Mild aortic valve regurgitation.    Pulmonary artery pressure 56 mmHg in May 2018.    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   Acute on chronic kidney disease.   Hypertension.    Diabetes.    Anemia.    Former tobacco use.    Hyponatremia   SIRS    Recommendations:    Creatinine continues to worsen, nephrology following. Recommend continuing IV diuresis despite rise in Cr. Valsartan on hold.   Continue 40 mg IV lasix qd and trend BMPs    May increase hydralazine further if needed for BP control.   SIRS tx per ID.      Medications:      Scheduled Meds: PRN Meds:        albuterol-ipratropium 3 mL Nebulization Q6H SCH   allopurinol 100 mg Oral Daily   atorvastatin 20 mg Oral Daily   azithromycin 500 mg Oral Daily   cefTRIAXone 1 g Intravenous Q24H   fluticasone furoate-vilanterol 1 puff Inhalation QAM   furosemide 40 mg Intravenous Daily   gabapentin 100 mg Oral TID   hydrALAZINE 50 mg Oral Q8H [place]   influenza 0.5 mL Intramuscular Once   insulin glargine 10 Units Subcutaneous QHS   insulin lispro 1-3 Units Subcutaneous QHS   insulin lispro 1-5 Units Subcutaneous TID AC   metoprolol succinate XL 25 mg Oral Daily    NIFEdipine 60 mg Oral QPM   pantoprazole 40 mg Oral QAM AC   predniSONE 30 mg Oral BID Meals       Continuous Infusions:     acetaminophen 650 mg Q4H PRN   Or     acetaminophen 650 mg Q4H PRN   albuterol 2 puff Q4H PRN   dextrose 15 g of glucose PRN   And     dextrose 12.5 g PRN   And     glucagon (rDNA) 1 mg PRN   naloxone 0.2 mg PRN   ondansetron 4 mg Q6H PRN   Or     ondansetron 4 mg Q6H PRN             Subjective:    Non english speaking, wife translates. Pt not speaking but responds to commands.       Physical Exam:     Vitals:    06/05/17 0857   BP: 158/71   Pulse: 86   Resp: 20   Temp: 100.4 F (38 C)   SpO2: 98%     Temp (24hrs), Avg:100.2 F (37.9 C), Min:98.1 F (36.7 C), Max:102.3 F (39.1 C)  Telemetry reviewed no changes. : paced     Intake and Output Summary (Last 24 hours) at Date Time    Intake/Output Summary (Last 24 hours) at 06/05/17 0952  Last data filed at 06/05/17 0857   Gross per 24 hour   Intake              370 ml   Output                0 ml   Net              370 ml       General Appearance:  Breathing comfortable  Head:  normocephalic  Eyes:  EOM's intact  Neck:  No carotid bruit; +jugular venous distension, brisk carotid upstroke  Lungs:  + bilateral crackles, normal resp effort  Chest Wall:  No tenderness or deformity  Heart:  S1, S2 normal, no S3, no S4, 2/6 systolic murmur, PMI not displaced, no rub   Abdomen:  Soft, non-tender, positive bowel sounds  Extremities:  No cyanosis or clubbing; 1+ edema  Pulses:  Equal radial pulses, 4/4 symmetric  Neurologic:  Affect wnl  Musculoskeletal: normal strength and tone    Labs:       Recent Labs  Lab 06/02/17  1928 06/02/17  1614 06/02/17  1155 06/02/17  1154   Creatine Kinase (CK)  --   --   --  156   Troponin I 0.02 0.02 0.01  --                Recent Labs  Lab 06/05/17  0524   Bilirubin, Total 0.5   Protein, Total 6.4   Albumin 3.7   ALT 10   AST (SGOT) 25       Recent Labs  Lab 06/02/17  1155   Magnesium 1.8           Recent  Labs  Lab 06/05/17  0524 06/04/17  0623 06/03/17  0627   WBC 8.73 8.71 7.10   Hgb 11.7* 11.1* 9.9*   Hematocrit 34.1* 31.6* 28.7*   Platelets 186 170 157       Recent Labs  Lab 06/05/17  0524 06/04/17  0623 06/03/17  0627   Sodium 131* 125* 123*   Potassium 5.0 4.8 4.4   Chloride 93* 91* 91*   CO2 23 26 25    BUN 29.4* 22.2 19.5   Creatinine 2.8* 2.1* 1.7*   EGFR 21.5 29.9 38.2   Glucose 133* 137* 116*   Calcium 9.3 9.4 9.1       Recent Labs  Lab 06/02/17  1154   TSH 2.54       .  Lab Results   Component Value Date    BNP 411.1 (H) 06/02/2017      Estimated Creatinine Clearance: 16.2 mL/min (A) (based on SCr of 2.8 mg/dL (H)).    Weight Monitoring 05/28/2017 06/02/2017 06/02/2017 06/03/2017 06/03/2017 06/03/2017 06/04/2017   Height - 157.5 cm - - - 157.5 cm -   Height Method - Stated - - - - -   Weight 77.111 kg 72.576 kg 76.522 kg 76.613 kg 76.2 kg - 74.753 kg   Weight Method - Stated Bed Scale Bed Scale Standing Scale - Bed Scale   BMI (calculated) - 29.3 kg/m2 - - - - -         Imaging:   Xr Chest 2 Views    Result Date: 06/02/2017   Stable prominent heart. Harrell Gave  Jeralyn Ruths, MD 06/02/2017 1:01 PM    X-ray Chest Pa And Lateral    Result Date: 05/28/2017   Stable elevation of the right hemidiaphragm. No acute disease. Elwanda Brooklyn, MD 05/28/2017 12:01 PM    Xr Knee 3 View Left    Result Date: 06/03/2017  1. No acute findings or evidence of erosion. 2. Tri compartment degenerative arthritis greatest medially. Moderate joint effusion. Lupita Leash, MD 06/03/2017 3:45 PM    Hip Right Ap And Lateral With Pelvis    Result Date: 06/02/2017   No evidence of fracture. Darnelle Bos, MD 06/02/2017 1:00 PM    Xr Chest Ap Portable    Result Date: 06/04/2017   Clear lungs. Unchanged mild cardiac enlargement. Ferd Hibbs, MD 06/04/2017 1:28 PM                  Signed by: Carlis Abbott, [place]  NP [place] (8am-5pm)  MD Spectralink [phone] (8am-5pm)  After hours, non urgent consult line [phone]  After Hours, urgent consults [phone]      Patient seen and examined, my assessment and plan as above. Lungs clear.    Brunetta Genera MD [facility]

## 2017-06-05 NOTE — Progress Notes (Signed)
[facility]  Progress Note  Patient Info:   Date/Time: 06/05/2017 / 4:27 PM   Admit Date:06/02/2017  Patient Name:Marco Bruce   ION:[id number]   PCP: Darlina Sicilian June, DO  Attending Physician:Miguel Medal, Stephenie Acres, MD     Assessment and Plan:     84 yo with PMH of PMR on chronic prednisone; presents with bilateral knee pain, fever, acute on chronic diastolic heart failure on IV lasix with worsening renal function.       Acute on chronic diastolic heart failure: Has been on Lasix 40 IV daily for the past 3-4 days.  Currently diuretic held due to worsening renal function.  Cardiology and renal on board.   Acute renal failure on chronic kidney disease: Creatinine bumped from 2.1-2.8;  likely due to aggressive diuresis.  Agree with holding Lasix   ID: Possible acute viral syndrome.  Procalcitonin: 0.19 Respiratory pathogen panel PCR negative: Recurrent fever; repeat chest x-ray shows new patchy airspace opacity in left base.  Empirically on azithromycin and ceftriaxone.  ID on board.     Bilateral severe knee pain: Possible acute gout arthropathy L>R: L knee xray shows tricompartment degenerative arthritis with moderate joint effusion.  Seems chronic.  Doesn't look infected. apprec ortho's input.  Empirically placed on prednisone 30 mg twice a day   Hyponatremia:  Likely due to volume overload from CHF   PMR: normally takes prednisone 5 mg daily   Dispo:     DVT Prohylaxis:heparin   Central Line/Foley Catheter/PICC line status: none  Code Status: Full Code  Disposition:home  Type of Admission:Inpatient  Expected Date of Discharge: 2-3  Milestones required for discharge:see above  Hospital Problems:   Active Problems:    Hyponatremia    Subjective:   06/05/17 c/o knee pain  Chief Complaint:  Hip Pain; Fever; and Joint Pain    Review of Systems   Constitutional: Negative for chills, fever and malaise/fatigue.   Respiratory: Negative for cough and hemoptysis.    Cardiovascular: Negative for chest pain,  palpitations and orthopnea.   Gastrointestinal: Negative for abdominal pain, nausea and vomiting.   Musculoskeletal: Positive for joint pain.   Neurological: Negative for weakness.     Objective:     Vitals:    06/05/17 0612 06/05/17 0759 06/05/17 0857 06/05/17 1422   BP: 127/67  158/71 126/60   Pulse: 76 75 86 90   Resp: 20 20 20 20    Temp: 99.7 F (37.6 C)  100.4 F (38 C) 98.2 F (36.8 C)   TempSrc: Oral      SpO2: 95% 95% 98% 99%   Weight:       Height:         Physical Exam:   Physical Exam   Constitutional: He is well-developed, well-nourished, and in no distress.   HENT:   Head: Normocephalic and atraumatic.   Neck: Normal range of motion. Neck supple.   Cardiovascular: Normal rate and regular rhythm.    Pulmonary/Chest: Effort normal and breath sounds normal. No respiratory distress. He has no wheezes.   Abdominal: Soft. Bowel sounds are normal. He exhibits no distension. There is no tenderness. There is no rebound.   Musculoskeletal:   Bilateral knee swelling with fluid.  No erythema no warmth     Results of Labs/imaging   Labs and radiology reports have been reviewed.    Hospitalist   Signed by:   Carney Bern  06/05/2017 4:27 PM    *This note was generated by the Epic EMR  system/ Dragon speech recognition and may contain inherent errors or omissions not intended by the user. Grammatical errors, random word insertions, deletions, pronoun errors and incomplete sentences are occasional consequences of this technology due to software limitations. Not all errors are caught or corrected. If there are questions or concerns about the content of this note or information contained within the body of this dictation they should be addressed directly with the author for clarification

## 2017-06-05 NOTE — Progress Notes (Signed)
Infectious Disease            Progress Note    06/05/2017   Marco Bruce [id number] is a 84 y.o. male, history of congestive heart failure, coronary artery disease, chronic kidney disease, atrial fibrillation, status post pacemaker placement, hypertension, diabetes mellitus, anemia, polymyalgia rheumatica, gout, admitted with systemic inflammatory response syndrome, bronchitis, possible acute gouty arthropathy.    Subjective:     Marco Bruce today Symptoms:  Low-grade fever, still complains of joint pains, shortness of breath, cough, denies any vomiting or diarrhea.  Complains of constipation. Other review of system is non contributory.    Objective:     Blood pressure 158/71, pulse 86, temperature 100.4 F (38 C), resp. rate 20, height 1.575 m (5\' 2" ), weight 74.8 kg (164 lb 12.8 oz), SpO2 98 %.    General Appearance:   No acute distress  HEENT: Pallor positive, Anicteric sclera.   Neck: Supple  Lungs:  Bilateral crackles.   Chest Wall: Symmetric chest wall expansion.   Heart : S1 and S2.   Abdomen: Abdomen is soft. There are no signs of ascites. Bowel sounds are normal. There is no abdominal tenderness. There is no mass. There is no splenomegaly or hepatomegaly.  Neurological:  Sleepy and lethargic. At present.   Extremities: Multiple joint pains, inability to bend knees, especially the left; decreased range of motion   Psychiatric:  Anxious     Laboratory And Diagnostic Studies:     Recent Labs      06/05/17   0524  06/04/17   0623   WBC  8.73  8.71   Hgb  11.7*  11.1*   Hematocrit  34.1*  31.6*   Platelets  186  170   Neutrophils  65.4  53.8     Recent Labs      06/05/17   0524  06/04/17   0623   Sodium  131*  125*   Potassium  5.0  4.8   Chloride  93*  91*   CO2  23  26   BUN  29.4*  22.2   Creatinine  2.8*  2.1*   Glucose  133*  137*   Calcium  9.3  9.4     Recent Labs      06/05/17   0524  06/04/17   0623   AST (SGOT)  25  21   ALT  10  7   Alkaline Phosphatase  48  54   Protein,  Total  6.4  6.1   Albumin  3.7  3.8   Bilirubin, Total  0.5  0.5       Current Med's:     Current Facility-Administered Medications   Medication Dose Route Frequency   . albuterol-ipratropium  3 mL Nebulization Q6H [place]   . allopurinol  100 mg Oral Daily   . atorvastatin  20 mg Oral Daily   . azithromycin  500 mg Oral Daily   . cefTRIAXone  1 g Intravenous Q24H   . fluticasone furoate-vilanterol  1 puff Inhalation QAM   . furosemide  40 mg Intravenous Daily   . gabapentin  100 mg Oral TID   . hydrALAZINE  50 mg Oral Q8H [place]   . influenza  0.5 mL Intramuscular Once   . insulin glargine  10 Units Subcutaneous QHS   . insulin lispro  1-3 Units Subcutaneous QHS   . insulin lispro  1-5 Units Subcutaneous TID AC   . metoprolol succinate XL  25  mg Oral Daily   . NIFEdipine  60 mg Oral QPM   . pantoprazole  40 mg Oral QAM AC   . predniSONE  30 mg Oral BID [place]       Lines/Drains:     Patient Lines/Drains/Airways Status    Active Lines, Drains and Airways     Name:   Placement date:   Placement time:   Site:   Days:    Peripheral IV 06/02/17 Left Antecubital  06/02/17        Antecubital    3                Assessment:      Condition: Guarded   Systemic inflammatory response syndrome   Bronchitis/pneumonia   Possible acute viral syndrome   Acute gouty arthropathy   Electrolyte imbalance   Congestive heart failure   Hypertension   Diabetes mellitus   Mitral regurgitation   Polymyalgia rheumatica   Chronic kidney disease   Status post pacemaker placement   Coronary artery disease   Gout     Plan:      Continue Rocephin   Continue Zithromax   Continue prednisone   Correction of electrolytes   Will follow cultures   Continue supportive care   Discussed with family          Serafina Royals, M.D.,FACP  06/05/2017  10:58 AM          *This note was generated by the Epic EMR system/ Dragon speech recognition and may contain inherent errors or omissions not intended by the user. Grammatical errors, random word  insertions, deletions, pronoun errors and incomplete sentences are occasional consequences of this technology due to software limitations. Not all errors are caught or corrected. If there are questions or concerns about the content of this note or information contained within the body of this dictation they should be addressed directly with the author for clarification

## 2017-06-05 NOTE — Consults (Signed)
CONSULTATION    Date Time: 06/04/17 3:18 PM  Patient Name: [facility]  Requesting Physician: Clayborne Artist, MD      Reason for Consultation:   Multiple joint pain and effusions    Assessment:   Inflammatory arthropathy - polymyalgia rheumatic vs gout in the setting of uncontrolled diabetes, CKD stage 3, CHF, moderate to severe mitral valve regurgitation with pacemaker and recent discontinuation of prednisone.     Patient has a history of polymyalgia rheumatic/gout.     Plan:   No evidence of an acute septic joint. WBC 8.71, ESR 35, CRP 5.9. No pain with ROM of bilateral knees from 0-65. Pain at end range consistent with arthropathy. Effusions are present in the left knee, right knee, bilateral ankles and bilateral elbows. Very unlikely to have multiple areas of septic arthritis concurrently in the setting of mildly elevated inflammatory markers and a WBC below 10.     Radiographs demonstrate no acute findings and the presence of tricompartmental osteoarthritis and an effusion similar in appearance to radiographs obtained in 05/2016.    Would recommend medical management with work up and treatment of inflammatory arthropathy/gout in the setting on multiple medical comorbidities.    Symptomatic/therapeutic aspiration per the medical team. Orthopaedic aspiration would be to rule out a septic joint and currently based on this presentation, I do not think it is warranted.     History:   Cheron Pasquarelli is a 84 y.o. male who presents to the hospital on 06/02/2017 with CHF exacerbation. Uncontrolled diabetic with vast medical and cardiac comorbidities. History of inflammatory arthropathy. Documented fever. Consult to rule out septic joint.     Past Medical History:     Past Medical History:   Diagnosis Date   . Acute diastolic heart failure 65/46/5035   . Acute on chronic diastolic congestive heart failure 08/13/2016   . Acute pulmonary edema 05/26/2015   . ARF (acute renal failure) 05/26/2015   . Chronic diastolic heart failure  46/56/8127   . Chronic gout    . Chronic pain syndrome 07/07/2015   . CKD (chronic kidney disease) stage 4, GFR 15-29 ml/min     2018 baseline creatinine in EPIC is 1.5-2.3.   . Congestive heart failure (CHF)     EF 40% 5/18 echo, on lasix   . Current chronic use of systemic steroids     For Polymyalgia rheumatica   . Essential hypertension    . Mixed hyperlipidemia    . NSTEMI (non-ST elevated myocardial infarction) 08/13/2016   . Osteoarthritis of both knees    . Peripheral edema 06/16/2015   . Pneumonia due to infectious organism, unspecified laterality, unspecified part of lung 06/25/2015   . Polymyalgia rheumatica     chronic steroids   . 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 Second degree heart block by electrocardiogram (ECG) 04/27/2015   . Type 2 diabetes mellitus with complication, with long-term current use of insulin     12/01/2016 Hemoglobin A1c 8.2%       Past Surgical History:     Past Surgical History:   Procedure Laterality Date   . ABDOMINAL SURGERY     . CHOLECYSTECTOMY     . PACEMAKER Left 2017       Family History:   History reviewed. No pertinent family history.    Social History:     Social History     Social History   . Marital status: Widowed     Spouse name: N/A   . Number of  children: N/A   . Years of education: N/A     Social History Main Topics   . Smoking status: Never Smoker   . Smokeless tobacco: Never Used   . Alcohol use No      Comment: former alcoholic   . Drug use: No   . Sexual activity: Not on file     Other Topics Concern   . Not on file     Social History Narrative   . No narrative on file       Allergies:   No Known Allergies    Medications:     Current Facility-Administered Medications   Medication Dose Route Frequency   . albuterol-ipratropium  3 mL Nebulization Q6H [place]   . allopurinol  100 mg Oral Daily   . atorvastatin  20 mg Oral Daily   . azithromycin  500 mg Oral Daily   . cefTRIAXone  1 g Intravenous Q24H   . fluticasone furoate-vilanterol  1 puff Inhalation QAM   .  furosemide  40 mg Intravenous Daily   . gabapentin  100 mg Oral TID   . hydrALAZINE  50 mg Oral Q8H [place]   . influenza  0.5 mL Intramuscular Once   . insulin glargine  10 Units Subcutaneous QHS   . insulin lispro  1-3 Units Subcutaneous QHS   . insulin lispro  1-5 Units Subcutaneous TID AC   . metoprolol succinate XL  25 mg Oral Daily   . NIFEdipine  60 mg Oral QPM   . pantoprazole  40 mg Oral QAM AC       Review of Systems:   A comprehensive review of systems was: Negative except otherwise mentioned in H&P  History obtained from grandson, chart review and unobtainable from patient due to age, mental status and language barrier    Physical Exam:     Vitals:    06/05/17 0212   BP: 143/67   Pulse: 82   Resp: 19   Temp: 98.1 F (36.7 C)   SpO2: 98%       Intake and Output Summary (Last 24 hours) at Date Time    Intake/Output Summary (Last 24 hours) at 06/05/17 0518  Last data filed at 06/04/17 1728   Gross per 24 hour   Intake              100 ml   Output                0 ml   Net              100 ml       General appearance - uncooperative and disoriented due to dementia but at baseline per grandson  bilateral knees and bilateral ankles demonstrate mild to moderate effusions. ROM of bilateral knee 0-65 without pain. There is discomfort past 65 to 85 flexion. unable to strength test or sensation test due to baseline dementia and language barrier. no erythema, drainage or pus  from skin at any joint. warm and well perfused distally     Labs Reviewed:     Results     Procedure Component Value Units Date/Time    Glucose Whole Blood - POCT [[id number]]  (Abnormal) Collected:  06/04/17 2119     Updated:  06/04/17 2148     POCT - Glucose Whole blood 155 (H) mg/dL     Lactic acid, plasma [[id number]] Collected:  06/04/17 2121    Specimen:  Blood Updated:  06/04/17 2140  Lactic acid 0.6 mmol/L     Narrative:       FEVER 102.3    Uric acid [[id number]] Collected:  06/04/17 2014    Specimen:  Blood Updated:  06/04/17 2048      Uric acid 7.6 mg/dL     Glucose Whole Blood - POCT [[id number]]  (Abnormal) Collected:  06/04/17 1634     Updated:  06/04/17 1640     POCT - Glucose Whole blood 141 (H) mg/dL     CULTURE BLOOD AEROBIC AND ANAEROBIC [[id number]] Collected:  06/04/17 0851    Specimen:  Blood from Blood, Venipuncture Updated:  06/04/17 1444    Narrative:       The order will result in two separate 8-74ml bottles  Please do NOT order repeat blood cultures if one has been  drawn within the last 48 hours.  AVOID BLOOD CULTURE DRAWS FROM CENTRAL LINE IF POSSIBLE  Indications:->Fever of greater than 101.5  1 BLUE+1 PURPLE    CULTURE BLOOD AEROBIC AND ANAEROBIC [[id number]] Collected:  06/04/17 0851    Specimen:  Blood from Blood, Venipuncture Updated:  06/04/17 1444    Narrative:       The order will result in two separate 8-17ml bottles  Please do NOT order repeat blood cultures if one has been  drawn within the last 48 hours.  AVOID BLOOD CULTURE DRAWS FROM CENTRAL LINE IF POSSIBLE  Indications:->Fever of greater than 101.5  1 BLUE+1 PURPLE    C Reactive Protein [[id number]]  (Abnormal) Collected:  06/04/17 0623    Specimen:  Blood Updated:  06/04/17 1437     C-Reactive Protein 5.9 (H) mg/dL     Sedimentation rate (ESR) [[id number]]  (Abnormal) Collected:  06/04/17 0623    Specimen:  Blood Updated:  06/04/17 1435     Sed Rate 35 (H) mm/Hr     Glucose Whole Blood - POCT [[id number]]  (Abnormal) Collected:  06/04/17 1113     Updated:  06/04/17 1118     POCT - Glucose Whole blood 218 (H) mg/dL     Comprehensive metabolic panel [[id number]]  (Abnormal) Collected:  06/04/17 0623    Specimen:  Blood Updated:  06/04/17 0738     Glucose 137 (H) mg/dL      BUN 22.2 mg/dL      Creatinine 2.1 (H) mg/dL      Sodium 125 (L) mEq/L      Potassium 4.8 mEq/L      Chloride 91 (L) mEq/L      CO2 26 mEq/L      Calcium 9.4 mg/dL      Protein, Total 6.1 g/dL      Albumin 3.8 g/dL      AST (SGOT) 21 U/L      ALT 7 U/L      Alkaline Phosphatase 54 U/L      Bilirubin,  Total 0.5 mg/dL      Globulin 2.3 g/dL      Albumin/Globulin Ratio 1.7     Anion Gap 8.0    GFR [[id number]] Collected:  06/04/17 0623     Updated:  06/04/17 0738     EGFR 29.9    Glucose Whole Blood - POCT [[id number]]  (Abnormal) Collected:  06/04/17 0719     Updated:  06/04/17 0724     POCT - Glucose Whole blood 147 (H) mg/dL     CBC with differential [[id number]]  (Abnormal) Collected:  06/04/17 0355    Specimen:  Blood from Blood Updated:  06/04/17 0708     WBC 8.71 x10 3/uL      Hgb 11.1 (L) g/dL      Hematocrit 31.6 (L) %      Platelets 170 x10 3/uL      RBC 3.60 (L) x10 6/uL      MCV 87.8 fL      MCH 30.8 pg      MCHC 35.1 g/dL      RDW 13 %      MPV 9.7 fL      Neutrophils 53.8 %      Lymphocytes Automated 17.6 %      Monocytes 10.8 %      Eosinophils Automated 17.1 %      Basophils Automated 0.5 %      Immature Granulocyte 0.2 %      Nucleated RBC 0.0 /100 WBC      Neutrophils Absolute 4.69 x10 3/uL      Abs Lymph Automated 1.53 x10 3/uL      Abs Mono Automated 0.94 (H) x10 3/uL      Abs Eos Automated 1.49 (H) x10 3/uL      Absolute Baso Automated 0.04 x10 3/uL      Absolute Immature Granulocyte 0.02 x10 3/uL      Absolute NRBC 0.00 x10 3/uL           Recent CBC   Recent Labs      06/04/17   0623   RBC  3.60*   Hgb  11.1*   Hematocrit  31.6*   MCV  87.8   MCH  30.8   MCHC  35.1   RDW  13   MPV  9.7       Rads:   Radiological Procedure reviewed.     XR Knee 3 View Left [[id number]] (Order [id number])   Status: Final result   Study Result     CLINICAL INFORMATION: Knee locking. No direct trauma.    TECHNIQUE AND FINDINGS: Left knee: AP, crosstable lateral, and both  obliques. Comparison with 05/26/2016.    Bone mineralization within normal limits. 3 compartment degenerative  arthrosis most marked medially where there is the greatest loss of joint  space that is mild to moderate. Moderate 3 compartment osteophytes. No  erosions. Moderate fullness in the suprapatellar region consistent with  moderate joint effusion.  No fat fluid level identified. Considerable  vascular calcification.    IMPRESSION:     1. No acute findings or evidence of erosion.  2. Tri compartment degenerative arthritis greatest medially. Moderate  joint effusion.    Lupita Leash, MD   06/03/2017 3:45 PM         Signed by: Ivan Croft

## 2017-06-06 DIAGNOSIS — E1165 Type 2 diabetes mellitus with hyperglycemia: Secondary | ICD-10-CM

## 2017-06-06 LAB — BASIC METABOLIC PANEL
Anion Gap: 16 — ABNORMAL HIGH (ref 5.0–15.0)
BUN: 47.4 mg/dL — ABNORMAL HIGH (ref 9.0–28.0)
CO2: 24 mEq/L (ref 22–29)
Calcium: 8.9 mg/dL (ref 7.9–10.2)
Chloride: 93 mEq/L — ABNORMAL LOW (ref 100–111)
Creatinine: 4.1 mg/dL — ABNORMAL HIGH (ref 0.7–1.3)
Glucose: 356 mg/dL — ABNORMAL HIGH (ref 70–100)
Potassium: 4.7 mEq/L (ref 3.5–5.1)
Sodium: 133 mEq/L — ABNORMAL LOW (ref 136–145)

## 2017-06-06 LAB — CBC AND DIFFERENTIAL
Absolute NRBC: 0 10*3/uL (ref 0.00–0.00)
Basophils Absolute Automated: 0.01 10*3/uL (ref 0.00–0.08)
Basophils Automated: 0.1 %
Eosinophils Absolute Automated: 0 10*3/uL (ref 0.00–0.44)
Eosinophils Automated: 0 %
Hematocrit: 30.7 % — ABNORMAL LOW (ref 37.6–49.6)
Hgb: 10.4 g/dL — ABNORMAL LOW (ref 12.5–17.1)
Immature Granulocytes Absolute: 0.03 10*3/uL (ref 0.00–0.07)
Immature Granulocytes: 0.4 %
Lymphocytes Absolute Automated: 0.61 10*3/uL (ref 0.42–3.22)
Lymphocytes Automated: 8.7 %
MCH: 30.1 pg (ref 25.1–33.5)
MCHC: 33.9 g/dL (ref 31.5–35.8)
MCV: 89 fL (ref 78.0–96.0)
MPV: 10 fL (ref 8.9–12.5)
Monocytes Absolute Automated: 0.53 10*3/uL (ref 0.21–0.85)
Monocytes: 7.6 %
Neutrophils Absolute: 5.82 10*3/uL (ref 1.10–6.33)
Neutrophils: 83.2 %
Nucleated RBC: 0 /100 WBC (ref 0.0–0.0)
Platelets: 171 10*3/uL (ref 142–346)
RBC: 3.45 10*6/uL — ABNORMAL LOW (ref 4.20–5.90)
RDW: 13 % (ref 11–15)
WBC: 7 10*3/uL (ref 3.10–9.50)

## 2017-06-06 LAB — GLUCOSE WHOLE BLOOD - POCT
Whole Blood Glucose POCT: 313 mg/dL — ABNORMAL HIGH (ref 70–100)
Whole Blood Glucose POCT: 365 mg/dL — ABNORMAL HIGH (ref 70–100)
Whole Blood Glucose POCT: 256 mg/dL — ABNORMAL HIGH (ref 70–100)
Whole Blood Glucose POCT: 266 mg/dL — ABNORMAL HIGH (ref 70–100)

## 2017-06-06 LAB — GFR: EGFR: 13.8

## 2017-06-06 MED ORDER — INSULIN GLARGINE 100 UNIT/ML SC SOLN
20.00 [IU] | Freq: Every evening | SUBCUTANEOUS | Status: DC
Start: 2017-06-06 — End: 2017-06-07
  Administered 2017-06-06: 22:00:00 20 [IU] via SUBCUTANEOUS
  Filled 2017-06-06: qty 20

## 2017-06-06 MED ORDER — TRAMADOL HCL 50 MG PO TABS
50.00 mg | ORAL_TABLET | Freq: Four times a day (QID) | ORAL | Status: DC | PRN
Start: 2017-06-06 — End: 2017-06-10
  Administered 2017-06-06: 50 mg via ORAL
  Filled 2017-06-06: qty 1

## 2017-06-06 MED ORDER — GLUCAGON 1 MG IJ SOLR (WRAP)
1.00 mg | INTRAMUSCULAR | Status: DC | PRN
Start: 2017-06-06 — End: 2017-06-06

## 2017-06-06 MED ORDER — DEXTROSE 50 % IV SOLN
12.50 g | INTRAVENOUS | Status: DC | PRN
Start: 2017-06-06 — End: 2017-06-06

## 2017-06-06 MED ORDER — INSULIN LISPRO 100 UNIT/ML SC SOLN
1.00 [IU] | Freq: Every evening | SUBCUTANEOUS | Status: DC
Start: 2017-06-06 — End: 2017-06-10
  Administered 2017-06-06: 22:00:00 4 [IU] via SUBCUTANEOUS
  Administered 2017-06-08: 23:00:00 2 [IU] via SUBCUTANEOUS
  Filled 2017-06-06: qty 6
  Filled 2017-06-06: qty 12

## 2017-06-06 MED ORDER — RISAQUAD PO CAPS
1.00 | ORAL_CAPSULE | Freq: Every day | ORAL | Status: DC
Start: 2017-06-06 — End: 2017-06-08
  Administered 2017-06-07 – 2017-06-08 (×2): 1 via ORAL
  Filled 2017-06-06 (×2): qty 1

## 2017-06-06 MED ORDER — INSULIN LISPRO 100 UNIT/ML SC SOLN
2.00 [IU] | Freq: Three times a day (TID) | SUBCUTANEOUS | Status: DC
Start: 2017-06-06 — End: 2017-06-10
  Administered 2017-06-06: 17:00:00 6 [IU] via SUBCUTANEOUS
  Administered 2017-06-07: 18:00:00 4 [IU] via SUBCUTANEOUS
  Administered 2017-06-07 (×2): 10 [IU] via SUBCUTANEOUS
  Administered 2017-06-07: 12:00:00 8 [IU] via SUBCUTANEOUS
  Administered 2017-06-08: 18:00:00 2 [IU] via SUBCUTANEOUS
  Administered 2017-06-08 – 2017-06-09 (×3): 4 [IU] via SUBCUTANEOUS
  Administered 2017-06-09: 09:00:00 2 [IU] via SUBCUTANEOUS
  Filled 2017-06-06: qty 12
  Filled 2017-06-06: qty 30
  Filled 2017-06-06: qty 6
  Filled 2017-06-06: qty 12
  Filled 2017-06-06: qty 6
  Filled 2017-06-06: qty 12
  Filled 2017-06-06: qty 30
  Filled 2017-06-06: qty 12
  Filled 2017-06-06: qty 18
  Filled 2017-06-06: qty 24

## 2017-06-06 MED ORDER — GLUCOSE 40 % PO GEL
15.00 g | ORAL | Status: DC | PRN
Start: 2017-06-06 — End: 2017-06-06

## 2017-06-06 MED ORDER — SODIUM CHLORIDE 0.9 % IV BOLUS
1000.00 mL | Freq: Once | INTRAVENOUS | Status: AC
Start: 2017-06-06 — End: 2017-06-06
  Administered 2017-06-06: 12:00:00 1000 mL via INTRAVENOUS

## 2017-06-06 MED ORDER — INSULIN LISPRO 100 UNIT/ML SC SOLN
10.00 [IU] | Freq: Once | SUBCUTANEOUS | Status: AC
Start: 2017-06-06 — End: 2017-06-06
  Administered 2017-06-06: 14:00:00 10 [IU] via SUBCUTANEOUS
  Filled 2017-06-06: qty 30

## 2017-06-06 MED ORDER — HYDRALAZINE HCL 25 MG PO TABS
25.00 mg | ORAL_TABLET | Freq: Three times a day (TID) | ORAL | Status: DC
Start: 2017-06-06 — End: 2017-06-06

## 2017-06-06 MED ORDER — INSULIN GLARGINE 100 UNIT/ML SC SOLN
10.00 [IU] | Freq: Once | SUBCUTANEOUS | Status: AC
Start: 2017-06-06 — End: 2017-06-06
  Administered 2017-06-06: 14:00:00 10 [IU] via SUBCUTANEOUS
  Filled 2017-06-06: qty 10

## 2017-06-06 NOTE — Progress Notes (Signed)
[facility]  Progress Note  Patient Info:   Date/Time: 06/06/2017 / 2:29 PM   Admit Date:06/02/2017  Patient Name:Marco Bruce   FXT:[id number]   PCP: Darlina Sicilian June, DO  Attending Physician:Laquandra Carrillo, Stephenie Ac[place], MD     Assessment and Plan:     84 yo with PMH of PMR on chronic prednisone; presents with bilateral knee pain, fever, acute on chronic diastolic heart failure on IV lasix with worsening renal function.       Acute on chronic diastolic heart failure: Has been on Lasix 40 IV daily for the past 3-4 days.  Currently diuretic held due to worsening renal function.  Cardiology and renal on board.   Acute renal failure on chronic kidney disease: Creatinine bumped from 2.8-4.1;  likely due to aggressive diuresis.  Agree with holding Lasix and giving 1 L bolus.   ID: Possible acute viral syndrome.  Procalcitonin: 0.19 Respiratory pathogen panel PCR negative: Recurrent fever; repeat chest x-ray shows new patchy airspace opacity in left base.  Empirically on azithromycin and ceftriaxone.  ID on board.     Bilateral severe knee pain: Possible acute gout arthropathy L>R: L knee xray shows tricompartment degenerative arthritis with moderate joint effusion.  Seems chronic.  Doesn't look infected. apprec ortho's input.  Empirically placed on prednisone 30 mg twice a day   Type 2 diabetes with uncontrolled hyperglycemia: Due to prednisone.  Will increase Lantus from 10-20 units daily at bedtime.  Placed on high-dose sliding scale.  Need to monitor glucose closely.  Due to the possibility of hypoglycemia.  Given worsening renal function.   Inadequate pain control: Start tramadol 50 mg every 6 as needed along with Tylenol.  Need to encourage him to get OOB; refused to work with PT.    Hyponatremia:  Likely due to volume overload from CHF   PMR: normally takes prednisone 5 mg daily   Dispo:     DVT Prohylaxis:heparin   Central Line/Foley Catheter/PICC line status: none  Code Status: Full  Code  Disposition:home  Type of Admission:Inpatient  Expected Date of Discharge: 2-3  Milestones required for discharge:see above  Hospital Problems:   Active Problems:    Stage 4 chronic kidney disease    Hyponatremia    Acute gout of left knee    Fever and chills    Acute renal failure superimposed on stage 4 chronic kidney disease    Subjective:   06/06/17 c/o knee pain  Chief Complaint:  Hip Pain; Fever; and Joint Pain    Review of Systems   Constitutional: Negative for chills, fever and malaise/fatigue.   Respiratory: Negative for cough and hemoptysis.    Cardiovascular: Negative for chest pain, palpitations and orthopnea.   Gastrointestinal: Negative for abdominal pain, nausea and vomiting.   Musculoskeletal: Positive for joint pain.   Neurological: Negative for weakness.     Objective:     Vitals:    06/06/17 0808 06/06/17 0908 06/06/17 1005 06/06/17 1335   BP:  115/49  120/53   Pulse: 77 78  70   Resp: 17 16  18    Temp:  98.2 F (36.8 C)  97.9 F (36.6 C)   TempSrc:  Oral  Oral   SpO2: 95% 97%  98%   Weight:   73.5 kg (162 lb)    Height:         Physical Exam:   Physical Exam   Constitutional: He is well-developed, well-nourished, and in no distress.   HENT:   Head:  Normocephalic and atraumatic.   Neck: Normal range of motion. Neck supple.   Cardiovascular: Normal rate and regular rhythm.    Pulmonary/Chest: Effort normal and breath sounds normal. No respiratory distress. He has no wheezes.   Abdominal: Soft. Bowel sounds are normal. He exhibits no distension. There is no tenderness. There is no rebound.   Musculoskeletal:   Bilateral knee swelling with fluid.  No erythema no warmth     Results of Labs/imaging   Labs and radiology reports have been reviewed.    Hospitalist   Signed by:   Carney Bern  06/06/2017 2:29 PM    *This note was generated by the Epic EMR system/ Dragon speech recognition and may contain inherent errors or omissions not intended by the user. Grammatical errors, random word  insertions, deletions, pronoun errors and incomplete sentences are occasional consequences of this technology due to software limitations. Not all errors are caught or corrected. If there are questions or concerns about the content of this note or information contained within the body of this dictation they should be addressed directly with the author for clarification

## 2017-06-06 NOTE — Progress Notes (Signed)
Nutritional Support Services  Nutrition Follow-up    Marco Bruce 84 y.o. male   MRN: [id number]    Summary of Nutrition Plan of Care & Recommendations:  1) Continue current diet - mechanical soft, consistent carb, cardiac  2) Will increase Glucerna to TID  3) Monitor wt trend  -----------------------------------------------------------------------------------------------------------------                                                     ASSESSMENT DATA     Subjective Nutrition: Spoke with grandson, pt was sleeping. Grandson reports pt has not been eating much. Said he has poor appetite. Reports no issues with nausea or bowel movements. Michela Pitcher he is consuming 2 Glucernas daily and pt enjoys them.   Events of Current Admission: kidney function worsening, holding lasix, salt and fluid restriction; admitted for hyponatremia, acute on chronic CHF, left knee severe pain, chronic cough, HTN     Medical Hx:  has a past medical history of Acute diastolic heart failure (14/01/3012); Acute on chronic diastolic congestive heart failure (08/13/2016); Acute pulmonary edema (05/26/2015); ARF (acute renal failure) (05/26/2015); Chronic diastolic heart failure (14/38/8875); Chronic gout; Chronic pain syndrome (07/07/2015); CKD (chronic kidney disease) stage 4, GFR 15-29 ml/min; Congestive heart failure (CHF); Current chronic use of systemic steroids; Essential hypertension; Mixed hyperlipidemia; NSTEMI (non-ST elevated myocardial infarction) (08/13/2016); Osteoarthritis of both knees; Peripheral edema (06/16/2015); Pneumonia due to infectious organism, unspecified laterality, unspecified part of lung (06/25/2015); Polymyalgia rheumatica; Renovascular hypertension (08/04/2015); Second degree heart block by electrocardiogram (ECG) (04/27/2015); and Type 2 diabetes mellitus with complication, with long-term current use of insulin.     Diet Hx: grandson said pt typically consume 2 meals daily, said they have been trying to follow a low salt  diet at home, but fell off recently.   Social Hx: lives in private residence with family members    Orders Placed This Encounter   Procedures   . Diet Mechanical Soft Additional restrictions: Consistent carbohydrate, Cardiac   . Glucerna Supplement Quantity: A. One; Frequency: BID with meals     Intake: grandson reports pt has been consuming between 25-50% of meals, 2 Glucernas daily    ANTHROPOMETRIC  Anthropometrics  Height: 157.5 cm (5\' 2" )  Weight: 73.5 kg (162 lb)  Weight Change: -1.7  IBW/kg (Calculated) Male: 53.64 kg  IBW/kg (Calculated) Male: 50 kg  BMI (calculated): 29.3    Nutrition Focused Physical Exam (NFPE): see RD note from 3/1  Edema: +1 BLE  Skin: intact  GI function: last bm 3/3, soft, nondistended, nontender    ESTIMATED NEEDS  Estimated Energy Needs  Total Energy Estimated Needs: 1580-1715 kcals  Method for Estimating Needs: MSJ x 1.2-1.3    Estimated Protein Needs  Total Protein Estimated Needs: 76-91 g  Method for Estimating Needs: 1.0-1.2 g/kg    Fluid Needs  Total Fluid Estimated Needs: 1900 ml  Method for Estimating Needs: 25 ml/kg    Pertinent Medications: humalog (1-5 units), lantus 10 units    Pertinent labs: Glucose 356, Na 133    Learning Needs: pt was educated on the consistent carbohydrate diet on 03/19/17; 3/4 provided grandson with Living Well with Heart Failure booklet and discussed strategies for family to implement to follow a low salt diet as they cook for pt.     Nutrition Assessment Summary: Pt continues with decreased po  intake d/t poor appetite. Will increase Glucerna to TID as pt enjoys these supplements. Please encourage po intake and monitor wt trend.                                                     NUTRITION DIAGNOSIS     Inadequate oral intake related to advanced age + poor appetite as evidenced by grandson report of pt consuming 25-50% of meals                                                           INTERVENTION     Summary of Nutrition Plan of Care &  Recommendations:  1) Continue current diet - mechanical soft, consistent carb, cardiac  2) Will increase Glucerna to TID  3) Monitor wt trend    Goals: Pt to consume >/= 50% of meals and supplements in 4-5 days                                                           MONITORING      po intake, wt, labs, GI, fluid status                                                     EVALUATION   Pt to consume >/= 50% of meals and supplements in 4-5 days- not met, goal ongoing    Anselmo Rod, [place] Dietitian  [phone]

## 2017-06-06 NOTE — PT Progress Note (Signed)
Physical Therapy Cancellation Note    Patient: Marco Bruce  [id number]:[id number]    Unit: G417/L278-N    Patient not seen for physical therapy secondary to deferred per grandson's request.  Grandson states that pt with 10/10 pain today, increased agitation and would likely not be able to participate.  Pt sleeping.  Will cont to follow.    Salem Senate, PT

## 2017-06-06 NOTE — OT Plan of Care Note (Signed)
Occupational Therapy Cancellation Note    Patient: Marco Bruce  [id number]    Unit: N406/E403-V    Patient not seen for occupational therapy secondary to pt's grandson decline services for today. OT order received, chart reviewed, OT attempted to eval pt this AM however pt's grandson at pt's bedside and declined services for today due pt with inc pain 10/10 at b/l knee, inc agitation and sleeping. This OT is familiar with pt and pt's grandson from past hospitalizations; pt's grandson is very supportive and involved with pt's care, this OT respects grandson's judgement. OT will continue to follow up, RN notified. Garlon Tuggle, OTR/L

## 2017-06-06 NOTE — Plan of Care (Signed)
Problem: Safety  Goal: Patient will be free from injury during hospitalization  Outcome: Progressing   06/06/17 0040   Goal/Interventions addressed this shift   Patient will be free from injury during hospitalization  Assess patient's risk for falls and implement fall prevention plan of care per policy;Provide and maintain safe environment;Use appropriate transfer methods;Ensure appropriate safety devices are available at the bedside;Include patient/ family/ care giver in decisions related to safety;Hourly rounding;Assess for patients risk for elopement and implement [place] per policy;Provide alternative method of communication if needed (communication boards, writing)       Comments:   Patient is mostly lethargic but is arousable to voice and does follow simple commands. Pt assisted with turning and repositioning. Grandson at bedside, is aware of plan of care. Call bell within reach. Will continue with plan of care.   Vitals:    06/05/17 2159   BP: 106/50   Pulse: 75   Resp: 16   Temp: 97.9 F (36.6 C)   SpO2: 98%

## 2017-06-06 NOTE — Progress Notes (Signed)
[facility].  Progress Note    Assessment:   AKI 2 to diuresis and realtive hypotension   Hyponatremia   CHF   DM with CKD   Hypertension with CKD   Anemia   CKD III    Plan:   Hold Lasix   D/C Hydralazine   1 litre IV fluid   Salt and fluid restriction   Serial labs    Queen Slough, MD  Office - [phone]  ++++++++++++++++++++++++++++++++++++++++++++++++++++++++++++++  Subjective:  No new complaints    Medications:  Scheduled Meds:  Current Facility-Administered Medications   Medication Dose Route Frequency   . albuterol-ipratropium  3 mL Nebulization Q6H [place]   . allopurinol  100 mg Oral Daily   . atorvastatin  20 mg Oral Daily   . azithromycin  500 mg Oral Daily   . cefTRIAXone  1 g Intravenous Q24H   . fluticasone furoate-vilanterol  1 puff Inhalation QAM   . gabapentin  100 mg Oral TID   . heparin (porcine)  5,000 Units Subcutaneous Q12H [facility]   . influenza  0.5 mL Intramuscular Once   . insulin glargine  10 Units Subcutaneous QHS   . insulin lispro  1-3 Units Subcutaneous QHS   . insulin lispro  1-5 Units Subcutaneous TID AC   . lactobacillus/streptococcus  1 capsule Oral Daily   . metoprolol succinate XL  25 mg Oral Daily   . NIFEdipine  60 mg Oral QPM   . pantoprazole  40 mg Oral QAM AC   . predniSONE  30 mg Oral BID Meals     Continuous Infusions:    PRN Meds:acetaminophen **OR** acetaminophen, albuterol, Nursing communication: Adult Hypoglycemia Treatment Algorithm **AND** dextrose **AND** dextrose **AND** glucagon (rDNA), naloxone, ondansetron **OR** ondansetron    Objective:  Vital signs in last 24 hours:  Temp:  [97.5 F (36.4 C)-98.4 F (36.9 C)] 98.2 F (36.8 C)  Heart Rate:  [73-90] 78  Resp Rate:  [16-20] 16  BP: (106-126)/(49-64) 115/49    Intake/Output from yesterday (07:01 - 07:00):  03/03 0701 - 03/04 0700  In: 700 [P.O.:700]  Out: 700 [Urine:700]     Physical Exam:   Gen: WD WN NAD   CV: S1 S2 N RRR   Chest: rales at bases   Ab: ND NT soft no  HSM +BS   Ext: 1+ edema    Labs:    Recent Labs  Lab 06/06/17  0611 06/05/17  0524 06/04/17  0623 06/03/17  0627  06/02/17  1155   Glucose 356* 133* 137* 116* More results in Results Review 87   BUN 47.4* 29.4* 22.2 19.5 More results in Results Review 19.1   Creatinine 4.1* 2.8* 2.1* 1.7* More results in Results Review 1.6*   Calcium 8.9 9.3 9.4 9.1 More results in Results Review 9.2   Sodium 133* 131* 125* 123* More results in Results Review 122*   Potassium 4.7 5.0 4.8 4.4 More results in Results Review 4.2   Chloride 93* 93* 91* 91* More results in Results Review 90*   CO2 24 23 26 25  More results in Results Review 25   Albumin  --  3.7 3.8 3.5  --  4.0   Phosphorus  --   --   --   --   --  3.8   Magnesium  --   --   --   --   --  1.8   More results in Results Review = values  in this interval not displayed.    Recent Labs  Lab 06/06/17  0611 06/05/17  0524 06/04/17  0623   WBC 7.00 8.73 8.71   Hgb 10.4* 11.7* 11.1*   Hematocrit 30.7* 34.1* 31.6*   MCV 89.0 88.3 87.8   MCH 30.1 30.3 30.8   MCHC 33.9 34.3 35.1   RDW 13 13 13    MPV 10.0 10.3 9.7   Platelets 171 186 170

## 2017-06-06 NOTE — Progress Notes (Signed)
PT/OT recommends recommends [place].  Case Manager met with patient's grandson, Atari Novick, in the room and discussed this information with him.  He told the CM that the family has discussed among themselves about SNF, but they prefer patient to go home with home health.  Family has declined SNF    CM left message for [facility] at [phone] to follow up with the family and arrange home care services.

## 2017-06-06 NOTE — Plan of Care (Signed)
Problem: Safety  Goal: Patient will be free from injury during hospitalization  Outcome: Progressing   06/06/17 0040   Goal/Interventions addressed this shift   Patient will be free from injury during hospitalization  Assess patient's risk for falls and implement fall prevention plan of care per policy;Provide and maintain safe environment;Use appropriate transfer methods;Ensure appropriate safety devices are available at the bedside;Include patient/ family/ care giver in decisions related to safety;Hourly rounding;Assess for patients risk for elopement and implement Elopement Risk Plan per policy;Provide alternative method of communication if needed (communication boards, writing)       Problem: Pain  Goal: Pain at adequate level as identified by patient  Outcome: Progressing   06/06/17 0830   Goal/Interventions addressed this shift   Pain at adequate level as identified by patient Identify patient comfort function goal;Assess pain on admission, during daily assessment and/or before any "as needed" intervention(s);Assess for risk of opioid induced respiratory depression, including snoring/sleep apnea. Alert healthcare team of risk factors identified.;Reassess pain within 30-60 minutes of any procedure/intervention, per Pain Assessment, Intervention, Reassessment (AIR) Cycle;Evaluate if patient comfort function goal is met       Problem: Compromised Tissue integrity  Goal: Damaged tissue is healing and protected  Outcome: Progressing   06/06/17 0830   Goal/Interventions addressed this shift   Damaged tissue is healing and protected  Monitor/assess Braden scale every shift;Provide wound care per wound care algorithm;Reposition patient every 2 hours and as needed unless able to reposition self;Increase activity as tolerated/progressive mobility;Relieve pressure to bony prominences for patients at moderate and high risk

## 2017-06-06 NOTE — Progress Notes (Signed)
Infectious Disease            Progress Note    06/06/2017   Brach Birdsall IRC:[id number] is a 84 y.o. male, history of congestive heart failure, coronary artery disease, chronic kidney disease, atrial fibrillation, status post pacemaker placement, hypertension, diabetes mellitus, anemia, polymyalgia rheumatica, gout, admitted with systemic inflammatory response syndrome, bronchitis, possible acute gouty arthropathy.    Subjective:     Marco Bruce today Symptoms: Afebrile, still complains of joint pains, shortness of breath, cough. Denies any vomiting or diarrhea.  Other review of system is non contributory.    Objective:     Blood pressure 122/64, pulse 73, temperature 97.5 F (36.4 C), resp. rate 16, height 1.575 m (5\' 2" ), weight 74.8 kg (164 lb 12.8 oz), SpO2 100 %.    General Appearance:   No acute distress; looks more comfortable  HEENT: Pallor positive, Anicteric sclera.   Neck: Supple  Lungs:  Decreased breath sound at bases  Chest Wall: Symmetric chest wall expansion.   Heart : S1 and S2.   Abdomen: Abdomen is soft. There are no signs of ascites. Bowel sounds are normal. There is no abdominal tenderness. There is no mass. There is no splenomegaly or hepatomegaly.  Neurological:  Sleepy and lethargic. At present.   Extremities: Multiple joint pains, inability to bend knees, especially the left; decreased range of motion   Psychiatric:  Anxious     Laboratory And Diagnostic Studies:     Recent Labs      06/06/17   0611  06/05/17   0524   WBC  7.00  8.73   Hgb  10.4*  11.7*   Hematocrit  30.7*  34.1*   Platelets  171  186   Neutrophils  83.2  65.4     Recent Labs      06/06/17   0611  06/05/17   0524   Sodium  133*  131*   Potassium  4.7  5.0   Chloride  93*  93*   CO2  24  23   BUN  47.4*  29.4*   Creatinine  4.1*  2.8*   Glucose  356*  133*   Calcium  8.9  9.3     Recent Labs      06/05/17   0524  06/04/17   0623   AST (SGOT)  25  21   ALT  10  7   Alkaline Phosphatase  48  54    Protein, Total  6.4  6.1   Albumin  3.7  3.8   Bilirubin, Total  0.5  0.5       Current Med's:     Current Facility-Administered Medications   Medication Dose Route Frequency   . albuterol-ipratropium  3 mL Nebulization Q6H [place]   . allopurinol  100 mg Oral Daily   . atorvastatin  20 mg Oral Daily   . azithromycin  500 mg Oral Daily   . cefTRIAXone  1 g Intravenous Q24H   . fluticasone furoate-vilanterol  1 puff Inhalation QAM   . gabapentin  100 mg Oral TID   . heparin (porcine)  5,000 Units Subcutaneous Q12H [facility]   . hydrALAZINE  50 mg Oral Q8H [place]   . influenza  0.5 mL Intramuscular Once   . insulin glargine  10 Units Subcutaneous QHS   . insulin lispro  1-3 Units Subcutaneous QHS   . insulin lispro  1-5 Units Subcutaneous TID AC   . metoprolol succinate XL  25 mg Oral Daily   . NIFEdipine  60 mg Oral QPM   . pantoprazole  40 mg Oral QAM AC   . predniSONE  30 mg Oral BID Meals       Lines/Drains:     Patient Lines/Drains/Airways Status    Active Lines, Drains and Airways     Name:   Placement date:   Placement time:   Site:   Days:    Peripheral IV 06/02/17 Left Antecubital  06/02/17        Antecubital    4                Assessment:      Condition: Guarded   Systemic inflammatory response syndrome   Bronchitis/pneumonia   Possible acute viral syndrome   Acute gouty arthropathy   Electrolyte imbalance   Congestive heart failure   Hypertension   Diabetes mellitus   Mitral regurgitation   Polymyalgia rheumatica   Chronic kidney disease   Status post pacemaker placement   Coronary artery disease   Gout     Plan:      Continue Rocephin   Continue Zithromax   Continue prednisone   Correction of electrolytes   Continue supportive care   Discussed with family   Discussed with Dr. Demetrios Isaacs, M.D.,FACP  06/06/2017  8:41 AM          *This note was generated by the Epic EMR system/ Dragon speech recognition and may contain inherent errors or omissions not intended by the  user. Grammatical errors, random word insertions, deletions, pronoun errors and incomplete sentences are occasional consequences of this technology due to software limitations. Not all errors are caught or corrected. If there are questions or concerns about the content of this note or information contained within the body of this dictation they should be addressed directly with the author for clarification

## 2017-06-06 NOTE — Progress Notes (Signed)
BG 365 prior to lunch. 5 Units SSI given per orders. MD paged.        06/06/17 1124   Abnormal Blood Glucose   Blood Glucose greater than 350? Yes   High Blood Glucose Precipitating Factors undetermined   High Blood Glucose Comment Paged MD

## 2017-06-06 NOTE — Progress Notes (Signed)
[place]    Date Time: 06/06/17 9:10 AM  Patient Name: Marco Bruce       Patient Active Problem List   Diagnosis   . Acute renal failure   . Stage 4 chronic kidney disease   . Acute diastolic heart failure   . Renovascular hypertension   . Hyponatremia   . Essential hypertension   . Acute on chronic diastolic congestive heart failure   . Hypokalemia   . Mixed hyperlipidemia   . Type 2 diabetes mellitus with complication, with long-term current use of insulin   . Acute cystitis without hematuria   . Generalized weakness   . Acute gout of left knee   . Fever and chills   . Acute renal failure superimposed on stage 4 chronic kidney disease       Assessment:      Acute on chronic diastolic congestive heart failure.     Left ventricular ejection fraction 75% by echocardiogram in 2017, reduced to 40% by echocardiogram in May of 2018.   Moderate to severe valvular disease.    Moderate to severe mitral valve regurgitation.    Mild aortic valve regurgitation.    Pulmonary artery pressure 56 mmHg in May 2018.    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   Acute on chronic kidney disease.  Rising Cr up to 4.1 from 1.7 on admission   Hypertension.    Diabetes.    Anemia.    Former tobacco use.    Hyponatremia   SIRS    Recommendations:    Creatinine continues to worsen, nephrology following. Recommend continuing IV diuresis despite rise in Cr. Valsartan on hold.   Hold any further diuresis for now.  May need to get some IVF   Cut back on Hydralazine, allow for some increase in BP   SIRS tx per ID.      Medications:      Scheduled Meds: PRN Meds:        albuterol-ipratropium 3 mL Nebulization Q6H [facility]   allopurinol 100 mg Oral Daily   atorvastatin 20 mg Oral Daily   azithromycin 500 mg Oral Daily   cefTRIAXone 1 g Intravenous Q24H   fluticasone furoate-vilanterol 1 puff Inhalation QAM   gabapentin 100 mg Oral TID   heparin (porcine) 5,000 Units Subcutaneous Q12H [place]    hydrALAZINE 50 mg Oral Q8H [place]   influenza 0.5 mL Intramuscular Once   insulin glargine 10 Units Subcutaneous QHS   insulin lispro 1-3 Units Subcutaneous QHS   insulin lispro 1-5 Units Subcutaneous TID AC   lactobacillus/streptococcus 1 capsule Oral Daily   metoprolol succinate XL 25 mg Oral Daily   NIFEdipine 60 mg Oral QPM   pantoprazole 40 mg Oral QAM AC   predniSONE 30 mg Oral BID Meals       Continuous Infusions:     acetaminophen 650 mg Q4H PRN   Or     acetaminophen 650 mg Q4H PRN   albuterol 2 puff Q4H PRN   dextrose 15 g of glucose PRN   And     dextrose 12.5 g PRN   And     glucagon (rDNA) 1 mg PRN   naloxone 0.2 mg PRN   ondansetron 4 mg Q6H PRN   Or     ondansetron 4 mg Q6H PRN             Subjective:    Per grandson breathing ok, laying flat in bed.  Cr up to 4.1      Physical Exam:     Vitals:    06/06/17 0908   BP: 115/49   Pulse: 78   Resp: 16   Temp: 98.2 F (36.8 C)   SpO2: 97%     Temp (24hrs), Avg:98.1 F (36.7 C), Min:97.5 F (36.4 C), Max:98.4 F (36.9 C)      Telemetry reviewed no changes. : paced     Intake and Output Summary (Last 24 hours) at Date Time    Intake/Output Summary (Last 24 hours) at 06/06/17 0910  Last data filed at 06/06/17 9276   Gross per 24 hour   Intake              450 ml   Output              700 ml   Net             -250 ml       General Appearance:  Breathing comfortable  Head:  normocephalic  Eyes:  EOM's intact  Neck:  No carotid bruit;  brisk carotid upstroke  Lungs:  Relatively clear  Chest Wall:  No tenderness or deformity  Heart:  S1, S2 normal, no S3, no S4, 2/6 systolic murmur, PMI not displaced, no rub   Abdomen:  Soft, non-tender, positive bowel sounds  Extremities:  No cyanosis or clubbing; 1+ edema  Pulses:  Equal radial pulses, 4/4 symmetric  Neurologic:  Affect wnl  Musculoskeletal: normal strength and tone    Labs:       Recent Labs  Lab 06/02/17  1928 06/02/17  1614 06/02/17  1155 06/02/17  1154   Creatine Kinase (CK)  --   --   --  156   Troponin  I 0.02 0.02 0.01  --                Recent Labs  Lab 06/05/17  0524   Bilirubin, Total 0.5   Protein, Total 6.4   Albumin 3.7   ALT 10   AST (SGOT) 25       Recent Labs  Lab 06/02/17  1155   Magnesium 1.8           Recent Labs  Lab 06/06/17  0611 06/05/17  0524 06/04/17  0623   WBC 7.00 8.73 8.71   Hgb 10.4* 11.7* 11.1*   Hematocrit 30.7* 34.1* 31.6*   Platelets 171 186 170       Recent Labs  Lab 06/06/17  0611 06/05/17  0524 06/04/17  0623   Sodium 133* 131* 125*   Potassium 4.7 5.0 4.8   Chloride 93* 93* 91*   CO2 24 23 26    BUN 47.4* 29.4* 22.2   Creatinine 4.1* 2.8* 2.1*   EGFR 13.8 21.5 29.9   Glucose 356* 133* 137*   Calcium 8.9 9.3 9.4       Recent Labs  Lab 06/02/17  1154   TSH 2.54       .  Lab Results   Component Value Date    BNP 411.1 (H) 06/02/2017      Estimated Creatinine Clearance: 11 mL/min (A) (based on SCr of 4.1 mg/dL (H)).    Weight Monitoring 05/28/2017 06/02/2017 06/02/2017 06/03/2017 06/03/2017 06/03/2017 06/04/2017   Height - 157.5 cm - - - 157.5 cm -   Height Method - Stated - - - - -   Weight 77.111 kg 72.576 kg 76.522 kg 76.613 kg 76.2 kg -  74.753 kg   Weight Method - Stated Bed Scale Bed Scale Standing Scale - Bed Scale   BMI (calculated) - 29.3 kg/m2 - - - - -         Imaging:   Xr Chest 2 Views    Result Date: 06/05/2017   New patchy airspace opacities in the left basal indicative of atelectasis or pneumonia in the correct clinical setting. Ferd Hibbs, MD 06/05/2017 10:32 AM    Xr Chest 2 Views    Result Date: 06/02/2017   Stable prominent heart. Darnelle Bos, MD 06/02/2017 1:01 PM    X-ray Chest Pa And Lateral    Result Date: 05/28/2017   Stable elevation of the right hemidiaphragm. No acute disease. Elwanda Brooklyn, MD 05/28/2017 12:01 PM    Xr Knee 3 View Left    Result Date: 06/03/2017  1. No acute findings or evidence of erosion. 2. Tri compartment degenerative arthritis greatest medially. Moderate joint effusion. Lupita Leash, MD 06/03/2017 3:45 PM    Hip Right Ap And Lateral With  Pelvis    Result Date: 06/02/2017   No evidence of fracture. Darnelle Bos, MD 06/02/2017 1:00 PM    Xr Chest Ap Portable    Result Date: 06/04/2017   Clear lungs. Unchanged mild cardiac enlargement. Ferd Hibbs, MD 06/04/2017 1:28 PM                  Signed by: Ozella Almond, MD        [facility]  NP [place] (8am-5pm)  MD Spectralink [phone] (8am-5pm)  After hours, non urgent consult line [phone]  After Hours, urgent consults [phone]      Patient seen and examined, my assessment and plan as above. Lungs clear.    Brunetta Genera MD [facility]

## 2017-06-07 DIAGNOSIS — I5031 Acute diastolic (congestive) heart failure: Secondary | ICD-10-CM

## 2017-06-07 DIAGNOSIS — Z794 Long term (current) use of insulin: Secondary | ICD-10-CM

## 2017-06-07 DIAGNOSIS — M10362 Gout due to renal impairment, left knee: Secondary | ICD-10-CM

## 2017-06-07 DIAGNOSIS — E1165 Type 2 diabetes mellitus with hyperglycemia: Secondary | ICD-10-CM

## 2017-06-07 LAB — CBC AND DIFFERENTIAL
Absolute NRBC: 0 10*3/uL (ref 0.00–0.00)
Basophils Absolute Automated: 0.01 10*3/uL (ref 0.00–0.08)
Basophils Automated: 0.1 %
Eosinophils Absolute Automated: 0 10*3/uL (ref 0.00–0.44)
Eosinophils Automated: 0 %
Hematocrit: 29.4 % — ABNORMAL LOW (ref 37.6–49.6)
Hgb: 9.9 g/dL — ABNORMAL LOW (ref 12.5–17.1)
Immature Granulocytes Absolute: 0.05 10*3/uL (ref 0.00–0.07)
Immature Granulocytes: 0.5 %
Lymphocytes Absolute Automated: 0.49 10*3/uL (ref 0.42–3.22)
Lymphocytes Automated: 4.7 %
MCH: 30.2 pg (ref 25.1–33.5)
MCHC: 33.7 g/dL (ref 31.5–35.8)
MCV: 89.6 fL (ref 78.0–96.0)
MPV: 10.1 fL (ref 8.9–12.5)
Monocytes Absolute Automated: 0.31 10*3/uL (ref 0.21–0.85)
Monocytes: 3 %
Neutrophils Absolute: 9.61 10*3/uL — ABNORMAL HIGH (ref 1.10–6.33)
Neutrophils: 91.7 %
Nucleated RBC: 0 /100 WBC (ref 0.0–0.0)
Platelets: 205 10*3/uL (ref 142–346)
RBC: 3.28 10*6/uL — ABNORMAL LOW (ref 4.20–5.90)
RDW: 13 % (ref 11–15)
WBC: 10.47 10*3/uL — ABNORMAL HIGH (ref 3.10–9.50)

## 2017-06-07 LAB — COMPREHENSIVE METABOLIC PANEL
ALT: 19 U/L (ref 0–55)
AST (SGOT): 45 U/L — ABNORMAL HIGH (ref 5–34)
Albumin/Globulin Ratio: 1.3 (ref 0.9–2.2)
Albumin: 3.3 g/dL — ABNORMAL LOW (ref 3.5–5.0)
Alkaline Phosphatase: 79 U/L (ref 38–106)
Anion Gap: 12 (ref 5.0–15.0)
BUN: 63.3 mg/dL — ABNORMAL HIGH (ref 9.0–28.0)
Bilirubin, Total: 0.2 mg/dL (ref 0.2–1.2)
CO2: 22 mEq/L (ref 22–29)
Calcium: 8.7 mg/dL (ref 7.9–10.2)
Chloride: 92 mEq/L — ABNORMAL LOW (ref 100–111)
Creatinine: 4.2 mg/dL — ABNORMAL HIGH (ref 0.7–1.3)
Globulin: 2.6 g/dL (ref 2.0–3.6)
Glucose: 427 mg/dL — ABNORMAL HIGH (ref 70–100)
Potassium: 4.7 mEq/L (ref 3.5–5.1)
Protein, Total: 5.9 g/dL — ABNORMAL LOW (ref 6.0–8.3)
Sodium: 126 mEq/L — ABNORMAL LOW (ref 136–145)

## 2017-06-07 LAB — GLUCOSE WHOLE BLOOD - POCT
Whole Blood Glucose POCT: 350 mg/dL — ABNORMAL HIGH (ref 70–100)
Whole Blood Glucose POCT: 409 mg/dL — ABNORMAL HIGH (ref 70–100)
Whole Blood Glucose POCT: 347 mg/dL — ABNORMAL HIGH (ref 70–100)
Whole Blood Glucose POCT: 243 mg/dL — ABNORMAL HIGH (ref 70–100)
Whole Blood Glucose POCT: 162 mg/dL — ABNORMAL HIGH (ref 70–100)

## 2017-06-07 LAB — GFR: EGFR: 13.4

## 2017-06-07 MED ORDER — INSULIN LISPRO 100 UNIT/ML SC SOLN
10.00 [IU] | Freq: Three times a day (TID) | SUBCUTANEOUS | Status: DC
Start: 2017-06-07 — End: 2017-06-10
  Administered 2017-06-07 – 2017-06-10 (×10): 10 [IU] via SUBCUTANEOUS
  Filled 2017-06-07 (×9): qty 30

## 2017-06-07 MED ORDER — BISACODYL 10 MG RE SUPP
10.00 mg | Freq: Once | RECTAL | Status: AC
Start: 2017-06-07 — End: 2017-06-07
  Administered 2017-06-07: 21:00:00 10 mg via RECTAL
  Filled 2017-06-07: qty 1

## 2017-06-07 MED ORDER — BISACODYL 5 MG PO TBEC
5.00 mg | DELAYED_RELEASE_TABLET | Freq: Every day | ORAL | Status: DC
Start: 2017-06-07 — End: 2017-06-07
  Administered 2017-06-07: 13:00:00 5 mg via ORAL
  Filled 2017-06-07: qty 1

## 2017-06-07 MED ORDER — INSULIN LISPRO 100 UNIT/ML SC SOLN
5.00 [IU] | Freq: Once | SUBCUTANEOUS | Status: AC
Start: 2017-06-07 — End: 2017-06-07
  Administered 2017-06-07: 04:00:00 5 [IU] via SUBCUTANEOUS
  Filled 2017-06-07: qty 15

## 2017-06-07 MED ORDER — MAGNESIUM HYDROXIDE 400 MG/5ML PO SUSP
30.00 mL | Freq: Every day | ORAL | Status: DC | PRN
Start: 2017-06-07 — End: 2017-06-10

## 2017-06-07 MED ORDER — PREDNISONE 20 MG PO TABS
20.00 mg | ORAL_TABLET | Freq: Two times a day (BID) | ORAL | Status: DC
Start: 2017-06-07 — End: 2017-06-08
  Administered 2017-06-07 – 2017-06-08 (×2): 20 mg via ORAL
  Filled 2017-06-07 (×2): qty 1

## 2017-06-07 MED ORDER — DOCUSATE SODIUM 100 MG PO CAPS
100.00 mg | ORAL_CAPSULE | Freq: Two times a day (BID) | ORAL | Status: DC
Start: 2017-06-07 — End: 2017-06-10
  Administered 2017-06-07 – 2017-06-10 (×6): 100 mg via ORAL
  Filled 2017-06-07 (×6): qty 1

## 2017-06-07 MED ORDER — INSULIN LISPRO 100 UNIT/ML SC SOLN
10.00 [IU] | Freq: Once | SUBCUTANEOUS | Status: DC
Start: 2017-06-07 — End: 2017-06-10
  Filled 2017-06-07: qty 30

## 2017-06-07 MED ORDER — INSULIN GLARGINE 100 UNIT/ML SC SOLN
30.00 [IU] | Freq: Every evening | SUBCUTANEOUS | Status: DC
Start: 2017-06-07 — End: 2017-06-10
  Administered 2017-06-07 – 2017-06-09 (×3): 30 [IU] via SUBCUTANEOUS
  Filled 2017-06-07 (×3): qty 30

## 2017-06-07 NOTE — Plan of Care (Signed)
Problem: Renal Instability  Goal: Fluid and electrolyte balance are achieved/maintained  Outcome: Progressing   06/07/17 7680   Goal/Interventions addressed this shift   Fluid and electrolyte balance are achieved/maintained  Monitor intake and output every shift;Monitor/assess lab values and report abnormal values;Provide adequate hydration;Monitor daily weight;Assess for confusion/personality changes;Assess and reassess fluid and electrolyte status     Goal: Nutritional intake is adequate  Outcome: Progressing   06/07/17 8811   Goal/Interventions addressed this shift   Nutritional intake is adequate Monitor daily weights;Assist patient with meals/food selection;Allow adequate time for meals;Encourage/perform oral hygiene as appropriate;Encourage/administer dietary supplements as ordered (i.e. tube feed, TPN, oral, OGT/NGT, supplements)       Problem: Diabetes: Glucose Imbalance  Goal: Blood glucose stable at established goal  Outcome: Not Progressing   06/07/17 0315   Goal/Interventions addressed this shift   Blood glucose stable at established goal  Monitor lab values;Monitor intake and output. Notify LIP if urine output is < 30 mL/hour.;Follow fluid restrictions/IV/PO parameters;Include patient/family in decisions related to nutrition/dietary selections;Assess for hypoglycemia /hyperglycemia;Monitor/assess vital signs     Pt's blood glucose 409 this AM. 10 units SSI given per orders.

## 2017-06-07 NOTE — Progress Notes (Signed)
Infectious Disease            Progress Note    06/07/2017   Marco Bruce SVX:[id number] is a 84 y.o. male, history of congestive heart failure, coronary artery disease, chronic kidney disease, atrial fibrillation, status post pacemaker placement, hypertension, diabetes mellitus, anemia, polymyalgia rheumatica, gout, admitted with systemic inflammatory response syndrome, bronchitis, possible acute gouty arthropathy.    Subjective:     Marco Bruce today Symptoms: Afebrile , worsening creatinine, still having difficulty in urination,  improving joint pains, shortness of breath, cough. Denies any vomiting or diarrhea.  Other review of system is non contributory.    Objective:     Blood pressure 115/49, pulse 71, temperature 97.7 F (36.5 C), temperature source Oral, resp. rate 18, height 1.575 m (5\' 2" ), weight 72.1 kg (159 lb), SpO2 96 %.    General Appearance:   No acute distress  HEENT: Pallor positive, Anicteric sclera.   Neck: Supple  Lungs:  Decreased breath sound at bases  Chest Wall: Symmetric chest wall expansion.   Heart : S1 and S2.   Abdomen: Abdomen is soft. There are no signs of ascites. Bowel sounds are normal. There is no abdominal tenderness. There is no mass. There is no splenomegaly or hepatomegaly.  Neurological:  Sleepy and lethargic. At present.   Extremities: Multiple joint pains, some swelling   Psychiatric:   Mood and affect is appropriate    Laboratory And Diagnostic Studies:     Recent Labs      06/07/17   0549  06/06/17   0611   WBC  10.47*  7.00   Hgb  9.9*  10.4*   Hematocrit  29.4*  30.7*   Platelets  205  171   Neutrophils  91.7  83.2     Recent Labs      06/07/17   0549  06/06/17   0611   Sodium  126*  133*   Potassium  4.7  4.7   Chloride  92*  93*   CO2  22  24   BUN  63.3*  47.4*   Creatinine  4.2*  4.1*   Glucose  427*  356*   Calcium  8.7  8.9     Recent Labs      06/07/17   0549  06/05/17   0524   AST (SGOT)  45*  25   ALT  19  10   Alkaline Phosphatase  79   48   Protein, Total  5.9*  6.4   Albumin  3.3*  3.7   Bilirubin, Total  0.2  0.5       Current Med's:     Current Facility-Administered Medications   Medication Dose Route Frequency   . albuterol-ipratropium  3 mL Nebulization Q6H [place]   . allopurinol  100 mg Oral Daily   . atorvastatin  20 mg Oral Daily   . fluticasone furoate-vilanterol  1 puff Inhalation QAM   . gabapentin  100 mg Oral TID   . heparin (porcine)  5,000 Units Subcutaneous Q12H [facility]   . influenza  0.5 mL Intramuscular Once   . insulin glargine  20 Units Subcutaneous QHS   . insulin lispro  1-6 Units Subcutaneous QHS   . insulin lispro  2-10 Units Subcutaneous TID AC   . lactobacillus/streptococcus  1 capsule Oral Daily   . metoprolol succinate XL  25 mg Oral Daily   . NIFEdipine  60 mg Oral QPM   . pantoprazole  40 mg Oral QAM AC   . predniSONE  30 mg Oral BID Meals       Lines/Drains:     Patient Lines/Drains/Airways Status    Active Lines, Drains and Airways     Name:   Placement date:   Placement time:   Site:   Days:    Peripheral IV 06/02/17 Left Antecubital  06/02/17        Antecubital    5                Assessment:      Condition: Guarded   Systemic inflammatory response syndrome   Bronchitis/pneumonia   Possible acute viral syndrome   Acute gouty arthropathy   Electrolyte imbalance   Congestive heart failure   Hypertension   Diabetes mellitus   Mitral regurgitation   Polymyalgia rheumatica   Acute on Chronic kidney disease   Status post pacemaker placement   Coronary artery disease   Gout     Plan:      Discontinue Rocephin   Discontinue Zithromax   Continue prednisone   Correction of electrolytes   Continue supportive care   Discussed with family   Nephrology follow-up   Consider urology evaluation           Serafina Royals, M.D.,FACP  06/07/2017  8:38 AM          *This note was generated by the Epic EMR system/ Dragon speech recognition and may contain inherent errors or omissions not intended by the user.  Grammatical errors, random word insertions, deletions, pronoun errors and incomplete sentences are occasional consequences of this technology due to software limitations. Not all errors are caught or corrected. If there are questions or concerns about the content of this note or information contained within the body of this dictation they should be addressed directly with the author for clarification

## 2017-06-07 NOTE — Progress Notes (Addendum)
Blood glucose 409 this AM. 10 units SSI given per orders. MD notified.        06/07/17 0712   Abnormal Blood Glucose   Blood Glucose greater than 350? Yes   High Blood Glucose Precipitating Factors steroid change   High Blood Glucose Comment MD Notified     Addendum 0900: MD gave orders for another 10 units insulin one time dose, added scheduled insulin for prior to meals, and increased bedtime Lantus from 20 units to 30 units.

## 2017-06-07 NOTE — Progress Notes (Signed)
Pt unable to void all day since 0730 AM. Bladder scan performed, shows only 125 mL. Pt denies urge to void. MD notified. Per MD, continue to monitor and no need to do straight cath unless greater than 500 mL.

## 2017-06-07 NOTE — Progress Notes (Signed)
[facility].  Progress Note    Assessment:   AKI - Cardiorenal syndrome   CKD Stage 3   Hyponatremia - Hypervolemic    Acute on chronic diastolic heart failure   Moderate to severe mitral valve regurgitation   Pacemaker   DM 2 with CKD   Hypertension with CKD   Anemia CKD    Plan:   Continue to hold diuretics    Francis Dowse, MD  Office - [phone]  ++++++++++++++++++++++++++++++++++++++++++++++++++++++++++++++  Subjective:  Awake, no SOB    Medications:  Scheduled Meds:  Current Facility-Administered Medications   Medication Dose Route Frequency   . albuterol-ipratropium  3 mL Nebulization Q6H [place]   . allopurinol  100 mg Oral Daily   . atorvastatin  20 mg Oral Daily   . bisacodyl  5 mg Oral Daily   . docusate sodium  100 mg Oral BID   . fluticasone furoate-vilanterol  1 puff Inhalation QAM   . gabapentin  100 mg Oral TID   . heparin (porcine)  5,000 Units Subcutaneous Q12H [facility]   . influenza  0.5 mL Intramuscular Once   . insulin glargine  30 Units Subcutaneous QHS   . insulin lispro  1-6 Units Subcutaneous QHS   . insulin lispro  10 Units Subcutaneous Once   . insulin lispro  10 Units Subcutaneous TID AC   . insulin lispro  2-10 Units Subcutaneous TID AC   . lactobacillus/streptococcus  1 capsule Oral Daily   . metoprolol succinate XL  25 mg Oral Daily   . NIFEdipine  60 mg Oral QPM   . pantoprazole  40 mg Oral QAM AC   . predniSONE  20 mg Oral BID Meals     Continuous Infusions:  PRN Meds:acetaminophen **OR** acetaminophen, albuterol, Nursing communication: Adult Hypoglycemia Treatment Algorithm **AND** dextrose **AND** dextrose **AND** glucagon (rDNA), magnesium hydroxide, naloxone, ondansetron **OR** ondansetron, traMADol    Objective:  Vital signs in last 24 hours:  Temp:  [97.7 F (36.5 C)-98.2 F (36.8 C)] 98.2 F (36.8 C)  Heart Rate:  [66-78] 73  Resp Rate:  [16-20] 16  BP: (104-129)/(48-74) 123/51  Intake/Output last 24 hours:    Intake/Output Summary  (Last 24 hours) at 06/07/17 1708  Last data filed at 06/07/17 1400   Gross per 24 hour   Intake             1300 ml   Output             1125 ml   Net              175 ml     Intake/Output this shift:  I/O this shift:  In: 550 [P.O.:550]  Out: 425 [Urine:425]    Physical Exam:   Gen: WD WN NAD   CV: S1 S2 N RRR   Chest: CTAB   Ab: ND NT soft no HSM +BS   Ext: No edema    Labs:    Recent Labs  Lab 06/07/17  0549 06/06/17  0611 06/05/17  0524 06/04/17  0623  06/02/17  1155   Glucose 427* 356* 133* 137* More results in Results Review 87   BUN 63.3* 47.4* 29.4* 22.2 More results in Results Review 19.1   Creatinine 4.2* 4.1* 2.8* 2.1* More results in Results Review 1.6*   Calcium 8.7 8.9 9.3 9.4 More results in Results Review 9.2   Sodium 126* 133* 131* 125* More results in Results Review 122*  Potassium 4.7 4.7 5.0 4.8 More results in Results Review 4.2   Chloride 92* 93* 93* 91* More results in Results Review 90*   CO2 22 24 23 26  More results in Results Review 25   Albumin 3.3*  --  3.7 3.8 More results in Results Review 4.0   Phosphorus  --   --   --   --   --  3.8   Magnesium  --   --   --   --   --  1.8   More results in Results Review = values in this interval not displayed.    Recent Labs  Lab 06/07/17  0549 06/06/17  0611 06/05/17  0524   WBC 10.47* 7.00 8.73   Hgb 9.9* 10.4* 11.7*   Hematocrit 29.4* 30.7* 34.1*   MCV 89.6 89.0 88.3   MCH 30.2 30.1 30.3   MCHC 33.7 33.9 34.3   RDW 13 13 13    MPV 10.1 10.0 10.3   Platelets 205 171 186

## 2017-06-07 NOTE — Plan of Care (Signed)
Problem: Renal Instability  Goal: Fluid and electrolyte balance are achieved/maintained  Outcome: Progressing   06/07/17 0236   Goal/Interventions addressed this shift   Fluid and electrolyte balance are achieved/maintained  Monitor intake and output every shift;Monitor/assess lab values and report abnormal values;Provide adequate hydration;Monitor daily weight;Assess for confusion/personality changes;Assess and reassess fluid and electrolyte status;Observe for seizure activity and initiate seizure precautions if indicated;Observe for cardiac arrhythmias;Follow fluid restrictions/IV/PO parameters;Monitor for muscle weakness       Problem: Diabetes: Glucose Imbalance  Goal: Blood glucose stable at established goal  Outcome: Progressing   06/07/17 0236   Goal/Interventions addressed this shift   Blood glucose stable at established goal  Monitor lab values;Monitor intake and output. Notify LIP if urine output is < 30 mL/hour.;Follow fluid restrictions/IV/PO parameters;Include patient/family in decisions related to nutrition/dietary selections;Assess for hypoglycemia /hyperglycemia;Monitor/assess vital signs;Coordinate medication administration with meals, as indicated;Ensure appropriate diet and assess tolerance;Ensure adequate hydration;Ensure appropriate consults are obtained (Nutrition, Diabetes Education, and Case Management);Ensure patient/family has adequate teaching materials

## 2017-06-07 NOTE — PT Progress Note (Signed)
[facility]  [place], [place]    Department of Rehabilitation  [phone]    Physical Therapy Daily Treatment Note    Patient: Marco Bruce    MRN#: [id number]   [id number]/[id number]  Time of Treatment: Start Time: 9458 Stop Time: 0935   Time Calculation: 40 min  PT Visit Number: 2    Patient's medical condition is appropriate for Physical Therapy intervention at this time.    Precautions:  Weight Bearing Status: no restrictions, pt reports significant arthritic pain in knees   Other Precautions: Falsl      Assessment:   Pt continues to present with generalized weakness and decreased functional moblity; will benefit from continued in patient rehab to address the following deficits:    Decreased LE strength;Decreased functional mobility;Decreased endurance/activity tolerance;Decreased balance;Gait impairment     Progress: Slow progress, decreased activity tolerance   Patient Goal: To return home      Plan:  Continue with Physical Therapy services to address functional mobility, endurance, and strength deficits.  Focus next session on: Bed mobility;Functional transfer training;LE strengthening/ROM;Exercise;Endurance training;Equipment eval/education;Patient/family training;Gait training   PT Frequency: 3-4x/wk   Based on today's session patient's discharge recommendation is the following:  SNF.   If SNF is not available, then the patient will need home health services, increase supervision , assistance with mobility and assistance with ADL's.             Subjective:  Patient is agreeable to participation in the therapy session.  Patients daughter present and translated t/o session, is very agreeable to patient's participation in the therapy session.  Nursing clears patient for therapy, reports pt had expressed a desire to get up out of bed this morning.     Pain Assessment: Numeric Scale (0-10)  Pain Score: 4-moderate pain  POSS Score: Awake and Alert  Pain Location: Knee  Pain Orientation:  Right;Left  Effect of Pain on Daily Activities: severe  Pain Intervention(s): Repositioned       Objective:  Observation of Patient/Vital Signs:  Reviewed patients chart and vital signs being monitored by nursing staff, appear to be within normal range and therapeutic parameters.  Patient is in bed with dressings, SCD's, peripheral IV site, and O2 at 2 liters/minute via nasal cannula in place.   Pt seen for functional activities and exercises as noted:     Cognition/Neuro Status  Arousal/Alertness: Appropriate responses to stimuli  Attention Span: Appears intact  Following Commands: Follows one step commands with increased time  Safety Awareness: minimal verbal instruction  Insights: Fully aware of deficits  Behavior: attentive;calm;cooperative    Functional Mobility  Supine to Sit: Minimal Assist;to Right;HOB raised;Increased Time;Increased Effort  Scooting to EOB: Minimal Assist  Sit to Stand: Moderate Assist  Stand to Sit: Moderate Assist  Transfers  Bed to Chair Stand Pivot Transfers: Moderate Assist to Right, turning steps 1 foot from bed to chair  Device Used for Functional Transfer: front-wheeled walker      Seated Therapeutic Exercise  Ankle Pumps:  B x 25-30   LAQ:  B x 10  Hip Flexion: B x 10  Hip Adduction Squeeze w/ pillow: x 10  Pt instructed in proper pacing and performance of ex's with focus and facilitation on correct LE positioning and proper cadence to maximize quality of each exercise.     Neuro Re-Ed  Sitting Balance: supervision at edge of bed  Standing Balance: moderate assist with support of RW and with instruction for turning steps  bed to chair     Treatment Activities: Bed mobility, transfers, balance assessment, LE ROM and strength ex's, safety awareness and cognition assessed, and activity tolerance noted.      Educated the patient to role of physical therapy, plan of care, goals of therapy and safety with mobility.  Encouraged patient to perform LE exercises for circulation and to  decrease effects of immobility.  Encouraged to sit OOB as tolerated for pulmonary hygiene.  Instructed patient not attempt to get up without assistance, and reinforced education that pt needs to call for staff assistance for all mobility for pt's safety. Pt voiced understanding.  Patient left reclined in chair, without needs and call bell within reach. Chair alarm set.  RN notified of session outcome.

## 2017-06-07 NOTE — Progress Notes (Addendum)
Pt  not fully emptying bladder, voided 100 last evening but had no more output through out the shift. Bladder scan this showed 521cc of urine. 500 cc was drained per intermittent cath. MD made aware please assess per bladder scan algorithm.

## 2017-06-07 NOTE — Progress Notes (Signed)
[facility]  Progress Note  Patient Info:   Date/Time: 06/07/2017 / 8:57 AM   Admit Date:06/02/2017  Patient Name:Marco Bruce   LZJ:[id number]   PCP: Darlina Sicilian June, DO  Attending Physician:Aleila Syverson, Raeanne Barry, MD     Assessment and Plan:     84 yo with PMH of PMR on chronic prednisone; presents with bilateral knee pain, fever, acute on chronic diastolic heart failure on IV lasix with worsening renal function.      Acute on chronic diastolic heart failure: Has been on Lasix 40 IV daily for the past 3-4 days.  Currently diuretic held due to worsening renal function.  Cardiology and renal on board.  Left ventricular ejection fraction 75% by echocardiogram in 2017, reduced to 40% by echocardiogram in May of 2018.  Moderate to severe mitral valve regurgitation.  Mild aortic valve regurgitation.  Pulmonary artery pressure 56 mmHg in May 2018.  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    Acute renal failure on chronic kidney disease III: Baseline ~1.7, 4.2 today,  likely due to aggressive diuresis and hypotension. Stopped hydralazine to allow some increase in BP.    - Continue to holding Lasix, received 1 Lit bolus on 06/06/17    SIRS, Concern for Bronchitis/Pneumonia, Possible acute viral syndrome.  Procalcitonin: 0.19, Respiratory pathogen panel PCR negative: Fevers improved  - was initially started empirically on azithromycin and ceftriaxone - stopped antibiotics today     Bilateral severe knee pain: Possible acute gout arthropathy L>R: L knee xray shows tricompartment degenerative arthritis with moderate joint effusion.  Seems chronic.  Doesn't look infected. apprec ortho's input.  Empirically placed on prednisone 30 mg twice a day - will taper as tolerated, reduced to 20 BID     Type 2 diabetes with uncontrolled hyperglycemia: Due to prednisone. Need to monitor glucose closely due to the possibility of hypoglycemia given worsening renal function. BS in 400's today, increased Lantus to 30 HS and add 10 units  TID with SSI  AIC is 7.6    Inadequate pain control: Start tramadol 50 mg every 6 as needed along with Tylenol.  Need to encourage him to get OOB and to work with PT.     Hyponatremia:  Likely due to volume overload from CHF    PMR: normally takes prednisone 5 mg daily, which was held couple days ago resulting in worsening knee pain, now on Prednisone 30 BID will taper as tolerated    Leucocytosis - likely due to steroids     Anemia - likely due to CKD    Constipation - started on Colace, Dulcolax and MOM prn    Urinary retention - likely due to condom cath and constipation, resolved now     Disp - PT suggested SNF, but family has decided to take her home with home health.       DVT Prohylaxis:heparin   Central Line/Foley Catheter/PICC line status: none  Code Status: Full Code  Disposition:home  Type of Admission:Inpatient  Expected Date of Discharge: 2-3 days   Milestones required for discharge:see above    Hospital Problems:   Active Problems:    Stage 4 chronic kidney disease    Acute diastolic heart failure    Hyponatremia    Type 2 diabetes mellitus with complication, with long-term current use of insulin    Acute gout of left knee    Fever and chills    Acute renal failure superimposed on stage 4 chronic kidney disease    Type  2 diabetes mellitus with hyperglycemia    Subjective:   06/07/17 Pt denies CP,SOB, palpitations. Knee pain is improving. Passing urine well after condom cath was removed   Chief Complaint:  Hip Pain; Fever; and Joint Pain    Review of Systems   Constitutional: Negative for chills and fever.   HENT: Negative for hearing loss and tinnitus.    Respiratory: Positive for wheezing (occasional ). Negative for cough, hemoptysis and shortness of breath.    Cardiovascular: Negative for chest pain and palpitations.   Gastrointestinal: Negative for heartburn, nausea and vomiting.   Genitourinary: Negative for dysuria and urgency.   Musculoskeletal:        Bilateral knee pain and swelling - slowly  improving   Skin: Negative for itching and rash.   Neurological: Negative for dizziness and headaches.   Psychiatric/Behavioral: Negative for depression and suicidal ideas.     Objective:     Vitals:    06/07/17 0231 06/07/17 0557 06/07/17 0721 06/07/17 0808   BP:  115/49     Pulse: 72 66  71   Resp: 18 18  18    Temp:  97.7 F (36.5 C)     TempSrc:  Oral     SpO2:  99%  96%   Weight:   72.1 kg (159 lb)    Height:         Physical Exam:   Physical Exam   Constitutional: He is oriented to person, place, and time and well-developed, well-nourished, and in no distress.   HENT:   Head: Normocephalic and atraumatic.   Cardiovascular: Normal rate and regular rhythm.    Pulmonary/Chest: Effort normal. No respiratory distress. He has no wheezes. He has rales (right base).   Abdominal: Soft. Bowel sounds are normal. He exhibits distension (mild). There is no tenderness. There is no rebound.   Musculoskeletal:   Bilateral knee swelling, erythema and tenderness   Neurological: He is alert and oriented to person, place, and time.   Skin: Skin is warm and dry.   Nursing note and vitals reviewed.    Results of Labs/imaging   Labs and radiology reports have been reviewed.    Hospitalist   Signed by:   Sherre Scarlet  06/07/2017 8:57 AM    *This note was generated by the Epic EMR system/ Dragon speech recognition and may contain inherent errors or omissions not intended by the user. Grammatical errors, random word insertions, deletions, pronoun errors and incomplete sentences are occasional consequences of this technology due to software limitations. Not all errors are caught or corrected. If there are questions or concerns about the content of this note or information contained within the body of this dictation they should be addressed directly with the author for clarification

## 2017-06-07 NOTE — OT Eval Note (Signed)
[facility]  [place]  [phone]    Occupational Therapy Evaluation    Patient: Marco Bruce    MRN#: [id number]     [id number]    Time of treatment: Time Calculation  OT Received On: 06/07/17  Start Time: 0950  Stop Time: 1010  Time Calculation (min): 20 min  Total Treatment Time (min): 20  OT Visit Number: 1    Consult received for Marco Bruce for OT Evaluation and Treatment.  Patient's medical condition is appropriate for Occupational therapy intervention at this time.    Assessment:   Marco Bruce is a 84 y.o. male admitted 06/02/2017.   Expanded chart review completed including review of labs, review of imaging, review of vitals, review of H&P and physician progress notes and review of consulting physician notes .  Pt's ability to complete ADLs and functional transfers is impaired due to the following deficits:  decreased activity tolerance, decreased balance, gait impairment, pain, ROM deficits, decreased strength and transfers .  Pt demonstrates performance deficits with feeding, grooming, bathing, dressing, toileting and functional mobility. There are a few comorbidities or other factors that affect plan of care and require modification of task including: assistive device needed for mobility, chronic pain, dementia, has stairs to manage and PMHx.  Pt would continue to benefit from OT to address these deficits and increase functional independence.    Assessment: decreased ROM;decreased strength;balance deficits;decreased independence with ADLs;decreased endurance/activity tolerance (dec LE ROM/strength)     Complexity Chart Review Performance Deficits Clinical Decision Making Hx/Comorbidities Assistance needed   Low Brief 1-3 Limited options None None (or at baseline)   Moderate Expanded 3-5 Several Options 1-2 Min/Mod assist (not at baseline)   High Extensive 5 or more Multiple options 3 or more Max/dependent (not at baseline      Therapy Diagnosis: generalized weakness, decreased functional mobility , decreased independence with ADL's, increased gait dysfunction and decreased endurance/ activity engagement due to current medical conditions, hyponatremia, gen weakness, essential HTN. Without therapy interventions, patient is at risk for falls, dependence on caregivers for mobility, dependence on caregivers for ADL's, decreased independence, failure to return to PLOF.    Rehabilitation Potential: Prognosis: Good;With continued OT s/p acute discharge;With family      Plan:   OT Frequency Recommended: 2-3x/wk   Treatment Interventions: ADL retraining;Functional transfer training;UE strengthening/ROM;Endurance training;Patient/Family training;Equipment eval/education;Compensatory technique education          Risks/Benefits/POC Discussed with Pt/Family: With patient/family    Goals:   Goal Formulation: Patient;Family  Time For Goal Achievement: by time of discharge  ADL Goals  Patient will feed self: Supervision  Patient will groom self: Supervision;at sinkside;5 visits  Patient will dress upper body: Minimal Assist;5 visits  Patient will dress lower body: Moderate Assist;with AE;5 visits  Pt will complete bathing: Moderate Assist;5 visits  Patient will toilet: Minimal Assist;5 visits  Mobility and Transfer Goals  Pt will perform functional transfers: Stand by Assist;with rolling walker;5 visits  Pt will transfer bed to [facility]: Stand by Assist;with rolling walker;5 visits  Pt will transfer bed to toilet: Stand by Assist;with rolling walker;5 visits  Pt will transfer bed to chair: Stand by Assist;with rolling walker;5 visits                         Discharge Recommendations:   Based on today's session patient's discharge recommendation is the following: Discharge Recommendation: Home with  supervision;Home with home health OT.     If Discharge Recommendation: Home with supervision;Home with home health OT is not available, then the patient will  need home health services, increase supervision , 24/7 supervision, assistance with mobility, assistance with ADL's and assistance with IADL's.  DME Recommended for Discharge: [employer];Sock aid        Precautions and Contraindications:   Precautions  Weight Bearing Status: no restrictions  Other Precautions: fall      Medical Diagnosis: Mixed hyperlipidemia [E78.2]  Hyponatremia [E87.1]  Generalized weakness [R53.1]  Essential hypertension [I10]  Type 2 diabetes mellitus with complication, with long-term current use of insulin [E11.8, Z79.4]  Stage 4 chronic kidney disease [N18.4]    History of Present Illness: Marco Bruce is a 84 y.o. male admitted on 06/02/2017 with "complaints of weakness. The patient's grandson is at the bedside who is translating for him.  The patient tells me that he has been feeling poorly for approximately one month with a dry cough.  He has seen his primary care physician for this, has had outpatient x-rays which appear normal.  He reports wheezing but denies SOB.  pproximately one week ago he developed stiffness from his hip to his knees.  This morning he had difficulty walking.  2 days ago the patient vomited 2 with complaints of nausea. The patient denies dietary indiscretion and tells me that he drinks about 32 ounces of water per day.  He has not missed any medications and there are no new medications.  Denies headache, dizziness, visual changes, chest pain, shortness of breath, abdominal pain, diarrhea, constipation, or urinary symptoms. " per H&P      Patient Active Problem List   Diagnosis   . Acute renal failure   . Stage 4 chronic kidney disease   . Acute diastolic heart failure   . Renovascular hypertension   . Hyponatremia   . Essential hypertension   . Acute on chronic diastolic congestive heart failure   . Hypokalemia   . Mixed hyperlipidemia   . Type 2 diabetes mellitus with complication, with long-term current use of insulin   . Acute cystitis without hematuria   . Generalized  weakness   . Acute gout of left knee   . Fever and chills   . Acute renal failure superimposed on stage 4 chronic kidney disease   . Type 2 diabetes mellitus with hyperglycemia        Past Medical/Surgical History:  Past Medical History:   Diagnosis Date   . Acute diastolic heart failure 41/93/7902   . Acute on chronic diastolic congestive heart failure 08/13/2016   . Acute pulmonary edema 05/26/2015   . ARF (acute renal failure) 05/26/2015   . Chronic diastolic heart failure 40/97/3532   . Chronic gout    . Chronic pain syndrome 07/07/2015   . CKD (chronic kidney disease) stage 4, GFR 15-29 ml/min     2018 baseline creatinine in EPIC is 1.5-2.3.   . Congestive heart failure (CHF)     EF 40% 5/18 echo, on lasix   . Current chronic use of systemic steroids     For Polymyalgia rheumatica   . Essential hypertension    . Mixed hyperlipidemia    . NSTEMI (non-ST elevated myocardial infarction) 08/13/2016   . Osteoarthritis of both knees    . Peripheral edema 06/16/2015   . Pneumonia due to infectious organism, unspecified laterality, unspecified part of lung 06/25/2015   . Polymyalgia rheumatica     chronic steroids   .  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 Second degree heart block by electrocardiogram (ECG) 04/27/2015   . Type 2 diabetes mellitus with complication, with long-term current use of insulin     12/01/2016 Hemoglobin A1c 8.2%      Past Surgical History:   Procedure Laterality Date   . ABDOMINAL SURGERY     . CHOLECYSTECTOMY     . PACEMAKER Left 2017         X-Rays/Tests/Labs:  Xr Chest 2 Views    Result Date: 06/05/2017   New patchy airspace opacities in the left basal indicative of atelectasis or pneumonia in the correct clinical setting. Ferd Hibbs, MD 06/05/2017 10:32 AM    Xr Chest 2 Views    Result Date: 06/02/2017   Stable prominent heart. Darnelle Bos, MD 06/02/2017 1:01 PM    X-ray Chest Pa And Lateral    Result Date: 05/28/2017   Stable elevation of the right hemidiaphragm. No acute disease.  Elwanda Brooklyn, MD 05/28/2017 12:01 PM    Xr Knee 3 View Left    Result Date: 06/03/2017  1. No acute findings or evidence of erosion. 2. Tri compartment degenerative arthritis greatest medially. Moderate joint effusion. Lupita Leash, MD 06/03/2017 3:45 PM    Hip Right Ap And Lateral With Pelvis    Result Date: 06/02/2017   No evidence of fracture. Darnelle Bos, MD 06/02/2017 1:00 PM    Xr Chest Ap Portable    Result Date: 06/04/2017   Clear lungs. Unchanged mild cardiac enlargement. Ferd Hibbs, MD 06/04/2017 1:28 PM      Social History:  Prior Level of Function  Prior level of function: Ambulates with assistive device, Needs assistance with ADLs  Assistive Device: Single point cane, Front wheel walker  Baseline Activity Level: Community ambulation  Driving: does not drive  Dressing - Upper Body: moderate assist  Dressing - Lower Body: maximal assist  Cooking: No  Feeding: stand by assist  Bathing: moderate assist  Grooming: minimal assist  Toileting: modified independent  DME Currently at Home: Front wheel walker, Wheelchair-manual, 3-in-1 BSC, Single point cane  Home Living Arrangements  Living Arrangements: Children, Family members  Type of Home: House  Home Layout: Multi-level, Bed/bath upstairs, Performs ADL's on one level  Bathroom Shower/Tub: Administrator, Civil Service: Public affairs consultant, Commode, Hand-held shower (pt's daughter reports no grab bars)  DME Currently at Home: [employer] walker, Wheelchair-manual, 3-in-1 BSC, Single point cane      Subjective:   Patient is agreeable to participation in the therapy session. Family and/or guardian are agreeable to patient's participation in the therapy session. Nursing clears patient for therapy.     Pain Assessment  Pain Assessment: Numeric Scale (0-10)  Pain Score: 8-severe pain  POSS Score: Awake and Alert  Pain Location: Knee  Pain Orientation: Right;Left  Pain Descriptors: Aching  Pain Frequency: Continuous;Increases  with movement  Effect of Pain on Daily Activities: severe  Pain Intervention(s): Cold applied;Repositioned;Distraction;Elevated;Therapeutic presence;Therapeutic touch (RN cleared pt for cold pack to b/ll knees).        Objective:   Patient is reclined in chair with SCD's in place.         Cognition/Neuro Status  Arousal/Alertness: Appropriate responses to stimuli  Attention Span: Appears intact  Orientation Level: Oriented to place;Oriented to situation;Oriented to person  Memory: Decreased short term memory;Decreased recall of precautions  Following Commands: minimal verbal instruction  Safety Awareness: minimal verbal instruction  Insights:  Educated in Surveyor, mining;Fully aware of deficits  Problem Solving: minimal assistance  Behavior: attentive;calm;cooperative  Motor Planning: decreased processing speed  Coordination: intact  Hand Dominance: right handed    Gross ROM  Gross ROM: within functional limits  Right Upper Extremity ROM: within functional limits  Left Upper Extremity ROM: within functional limits  Right Lower Extremity ROM:  (limited see PT note)  Left Lower Extremity ROM:  (limited see PT note)  Gross Strength  Right Upper Extremity Strength: 3+/5  Left Upper Extremity Strength: 3+/5  Right Lower Extremity Strength:  (limited see PT note)  Left Lower Extremity Strength:  (limited see PT note)     Tone: within functional limits    Sensory  Auditory: intact  Visual Acuity: intact;wears glasses  Vision - Complex Assessment  Acuity: Able to read clock/calendar on wall without difficulty    Self-care and Home Management  Eating: Minimal Assist (per family, pt has needs assistance)  Grooming: Stand by Assist;in chair;verbal prompting;increased time to complete;wash/dry hands;wash/dry face  LB Dressing: Dependent;Don/doff R sock;Don/doff L sock  Functional Transfers: Moderate Assist    Mobility and Transfers  Sit to Stand: Moderate Assist;Increased Time;Increased Effort (w/RW)  Bed to Chair: Moderate  Assist (chair to chair w/RW)  Functional Mobility/Ambulation: Minimal Assist;Moderate Assist (w/RW)     Balance  Static Sitting Balance: Modified Independent  Dyanamic Sitting Balance: Stand by Assist  Static Standing Balance: Minimal Assist (w/RW)  Dynamic Standing Balance: Minimal Assist;Moderate Assist (w/RW)    Participation and Endurance  Participation Effort: fair    AM-PACT "6 Clicks" Daily Activity Inpatient Short Form  Inpatient AM-PACT Performed?: yes  Put On/Take Off Lower Body Clothing: Total  Assist with Bathing: A lot  Assist with Toileting: Total  Put On/Take Off Upper Body Clothing: A lot  Assist with Grooming: A little  Assist with Eating: A little  OT Daily Activity Raw Score: 12  CMS 0-100% Score: 66.57%    PMP - Progressive Mobility Protocol   PMP Activity: Step 6 - Walks in Room  Distance Walked (ft) (Step 6,7): 0 Feet    Treatment Activities:   Pt/Pt's daughter declined interpreter services and pt's daughter requested to be pt's interpreter.   Pt tolerated session well. Pt performed the following in addition to the above eval activities: Grooming, combing hair with S setup inc time and min verbal prompting for quality of care/completion of task. Functional amb with RW with Min/Mod A inc time verbal prompting. UE/LE AROM 10x1 set w/Supervision shoulder shrugs, shoulder flex, horizontal abd/adduction, elbow flex/ext, hand pumps, ankle pumps and pt marching in place in stance(w/Min A using RW). Pt/pt's daughter educated on pt performing therex and being OOB as often and frequent as he can. Pt's family requested cold packs for pt's b/l knee pain, RN cleared pt for cold packs, pt provided with cold pack for each knee. Discussed in detail pt's d/c options however pt/pt's family wants to be d/c home with home health services.   Educated the patient to role of occupational therapy, plan of care, goals of therapy and safety with mobility and ADLs, pursed lip breathing, home safety.  Daianna Vasques,  OTR/L

## 2017-06-07 NOTE — Progress Notes (Signed)
[place]    Date Time: 06/07/17 11:06 AM  Patient Name: Marco Bruce         Assessment:      Acute on chronic diastolic congestive heart failure.     Left ventricular ejection fraction 75% by echocardiogram in 2017, reduced to 40% by echocardiogram in May of 2018.   Moderate to severe valvular disease.    Moderate to severe mitral valve regurgitation.    Mild aortic valve regurgitation.    Pulmonary artery pressure 56 mmHg in May 2018.    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   Acute on chronic kidney disease.  Rising Cr up to 4.1 from 1.7 on admission   Hypertension.    Diabetes.    Anemia.    Former tobacco use.    Hyponatremia   SIRS    Recommendations:    Primary concerns at this time are non cardiac -> ARF and pulmonary infection   Holding diuretics and ARB    Medications:      Scheduled Meds: PRN Meds:        albuterol-ipratropium 3 mL Nebulization Q6H [place]   allopurinol 100 mg Oral Daily   atorvastatin 20 mg Oral Daily   fluticasone furoate-vilanterol 1 puff Inhalation QAM   gabapentin 100 mg Oral TID   heparin (porcine) 5,000 Units Subcutaneous Q12H [place]   influenza 0.5 mL Intramuscular Once   insulin glargine 30 Units Subcutaneous QHS   insulin lispro 1-6 Units Subcutaneous QHS   insulin lispro 10 Units Subcutaneous Once   insulin lispro 10 Units Subcutaneous TID AC   insulin lispro 2-10 Units Subcutaneous TID AC   lactobacillus/streptococcus 1 capsule Oral Daily   metoprolol succinate XL 25 mg Oral Daily   NIFEdipine 60 mg Oral QPM   pantoprazole 40 mg Oral QAM AC   predniSONE 30 mg Oral BID Meals       Continuous Infusions:     acetaminophen 650 mg Q4H PRN   Or     acetaminophen 650 mg Q4H PRN   albuterol 2 puff Q4H PRN   dextrose 15 g of glucose PRN   And     dextrose 12.5 g PRN   And     glucagon (rDNA) 1 mg PRN   naloxone 0.2 mg PRN   ondansetron 4 mg Q6H PRN   Or     ondansetron 4 mg Q6H PRN   traMADol 50 mg Q6H PRN             Subjective:     No chest pain.  Feels better        Physical Exam:     Vitals:    06/07/17 1038   BP: 129/52   Pulse: 78   Resp: 16   Temp: 98.2 F (36.8 C)   SpO2: 94%     Temp (24hrs), Avg:97.9 F (36.6 C), Min:97.7 F (36.5 C), Max:98.2 F (36.8 C)      Telemetry reviewed no changes. : paced     Intake and Output Summary (Last 24 hours) at Date Time    Intake/Output Summary (Last 24 hours) at 06/07/17 1106  Last data filed at 06/07/17 1000   Gross per 24 hour   Intake             2150 ml   Output             1125 ml   Net  1025 ml       General Appearance:  Breathing comfortable  Head:  normocephalic  Eyes:  EOM's intact  Neck:  No carotid bruit;  brisk carotid upstroke  Lungs:  Relatively clear  Chest Wall:  No tenderness or deformity  Heart:  S1, S2 normal, no S3, no S4, 2/6 systolic murmur, PMI not displaced, no rub   Abdomen:  Soft, non-tender, positive bowel sounds  Extremities:  No cyanosis or clubbing; 1+ edema  Pulses:  Equal radial pulses, 4/4 symmetric  Neurologic:  Affect wnl  Musculoskeletal: normal strength and tone    Labs:       Recent Labs  Lab 06/02/17  1928 06/02/17  1614 06/02/17  1155 06/02/17  1154   Creatine Kinase (CK)  --   --   --  156   Troponin I 0.02 0.02 0.01  --                Recent Labs  Lab 06/07/17  0549   Bilirubin, Total 0.2   Protein, Total 5.9*   Albumin 3.3*   ALT 19   AST (SGOT) 45*       Recent Labs  Lab 06/02/17  1155   Magnesium 1.8           Recent Labs  Lab 06/07/17  0549 06/06/17  0611 06/05/17  0524   WBC 10.47* 7.00 8.73   Hgb 9.9* 10.4* 11.7*   Hematocrit 29.4* 30.7* 34.1*   Platelets 205 171 186       Recent Labs  Lab 06/07/17  0549 06/06/17  0611 06/05/17  0524   Sodium 126* 133* 131*   Potassium 4.7 4.7 5.0   Chloride 92* 93* 93*   CO2 22 24 23    BUN 63.3* 47.4* 29.4*   Creatinine 4.2* 4.1* 2.8*   EGFR 13.4 13.8 21.5   Glucose 427* 356* 133*   Calcium 8.7 8.9 9.3       Recent Labs  Lab 06/02/17  1154   TSH 2.54       .  Lab Results   Component Value Date    BNP  411.1 (H) 06/02/2017      Estimated Creatinine Clearance: 10.6 mL/min (A) (based on SCr of 4.2 mg/dL (H)).    Weight Monitoring 06/02/2017 06/03/2017 06/03/2017 06/03/2017 06/04/2017 06/06/2017 06/07/2017   Height - - - 157.5 cm - - -   Height Method - - - - - - -   Weight 76.522 kg 76.613 kg 76.2 kg - 74.753 kg 73.483 kg 72.122 kg   Weight Method Bed Scale Bed Scale Standing Scale - Bed Scale Bed Scale Bed Scale   BMI (calculated) - - - - - - -         Imaging:   Xr Chest 2 Views    Result Date: 06/05/2017   New patchy airspace opacities in the left basal indicative of atelectasis or pneumonia in the correct clinical setting. Ferd Hibbs, MD 06/05/2017 10:32 AM    Xr Chest 2 Views    Result Date: 06/02/2017   Stable prominent heart. Darnelle Bos, MD 06/02/2017 1:01 PM    X-ray Chest Pa And Lateral    Result Date: 05/28/2017   Stable elevation of the right hemidiaphragm. No acute disease. Elwanda Brooklyn, MD 05/28/2017 12:01 PM    Xr Knee 3 View Left    Result Date: 06/03/2017  1. No acute findings or evidence of erosion. 2. Tri compartment degenerative  arthritis greatest medially. Moderate joint effusion. Lupita Leash, MD 06/03/2017 3:45 PM    Hip Right Ap And Lateral With Pelvis    Result Date: 06/02/2017   No evidence of fracture. Darnelle Bos, MD 06/02/2017 1:00 PM    Xr Chest Ap Portable    Result Date: 06/04/2017   Clear lungs. Unchanged mild cardiac enlargement. Ferd Hibbs, MD 06/04/2017 1:28 PM                  Signed by: Emeline General, MD        [place]  NP [place] (8am-5pm)  MD Spectralink [phone] (8am-5pm)  After hours, non urgent consult line [phone]  After Hours, urgent consults [phone]

## 2017-06-08 LAB — GFR: EGFR: 13.4

## 2017-06-08 LAB — BASIC METABOLIC PANEL
Anion Gap: 16 — ABNORMAL HIGH (ref 5.0–15.0)
BUN: 78.4 mg/dL — ABNORMAL HIGH (ref 9.0–28.0)
CO2: 22 mEq/L (ref 22–29)
Calcium: 8.8 mg/dL (ref 7.9–10.2)
Chloride: 96 mEq/L — ABNORMAL LOW (ref 100–111)
Creatinine: 4.2 mg/dL — ABNORMAL HIGH (ref 0.7–1.3)
Glucose: 229 mg/dL — ABNORMAL HIGH (ref 70–100)
Potassium: 4.5 mEq/L (ref 3.5–5.1)
Sodium: 134 mEq/L — ABNORMAL LOW (ref 136–145)

## 2017-06-08 LAB — GLUCOSE WHOLE BLOOD - POCT
Whole Blood Glucose POCT: 217 mg/dL — ABNORMAL HIGH (ref 70–100)
Whole Blood Glucose POCT: 244 mg/dL — ABNORMAL HIGH (ref 70–100)
Whole Blood Glucose POCT: 222 mg/dL — ABNORMAL HIGH (ref 70–100)
Whole Blood Glucose POCT: 182 mg/dL — ABNORMAL HIGH (ref 70–100)
Whole Blood Glucose POCT: 216 mg/dL — ABNORMAL HIGH (ref 70–100)

## 2017-06-08 MED ORDER — PREDNISONE 5 MG PO TABS
5.00 mg | ORAL_TABLET | Freq: Two times a day (BID) | ORAL | Status: DC
Start: 2017-06-09 — End: 2017-06-10
  Administered 2017-06-09 – 2017-06-10 (×3): 5 mg via ORAL
  Filled 2017-06-08 (×3): qty 1

## 2017-06-08 MED ORDER — GUAIFENESIN 100 MG/5ML PO SOLN
200.00 mg | ORAL | Status: DC | PRN
Start: 2017-06-08 — End: 2017-06-10
  Administered 2017-06-08 – 2017-06-10 (×5): 200 mg via ORAL
  Filled 2017-06-08 (×5): qty 10

## 2017-06-08 MED ORDER — PREDNISONE 10 MG PO TABS
10.00 mg | ORAL_TABLET | Freq: Two times a day (BID) | ORAL | Status: AC
Start: 2017-06-08 — End: 2017-06-08
  Administered 2017-06-08: 18:00:00 10 mg via ORAL
  Filled 2017-06-08: qty 1

## 2017-06-08 NOTE — Progress Notes (Signed)
Infectious Disease            Progress Note    06/08/2017   Marco Bruce [id number] is a 83 y.o. male, history of congestive heart failure, coronary artery disease, chronic kidney disease, atrial fibrillation, status post pacemaker placement, hypertension, diabetes mellitus, anemia, polymyalgia rheumatica, gout, admitted with systemic inflammatory response syndrome, bronchitis, possible acute gouty arthropathy.    Subjective:     Marco Bruce today Symptoms: Afebrile, feeling much better,  improving joint pains, shortness of breath, cough. Denies any vomiting or diarrhea.  Other review of system is non contributory.    Objective:     Blood pressure 109/60, pulse 60, temperature 97.5 F (36.4 C), resp. rate 16, height 1.575 m (5\' 2" ), weight 72.1 kg (159 lb), SpO2 97 %.    General Appearance:   No acute distress; looks comfortable  HEENT: Pallor positive, Anicteric sclera.   Neck: Supple  Lungs:  Decreased breath sound at bases  Chest Wall: Symmetric chest wall expansion.   Heart : S1 and S2.   Abdomen: Abdomen is soft. There are no signs of ascites. Bowel sounds are normal. There is no abdominal tenderness. There is no mass. There is no splenomegaly or hepatomegaly.  Neurological:  Sleepy and lethargic. At present.   Extremities: Multiple joint pains, improving   Psychiatric:   Mood and affect is appropriate    Laboratory And Diagnostic Studies:     Recent Labs      06/07/17   0549  06/06/17   0611   WBC  10.47*  7.00   Hgb  9.9*  10.4*   Hematocrit  29.4*  30.7*   Platelets  205  171   Neutrophils  91.7  83.2     Recent Labs      06/08/17   0621  06/07/17   0549   Sodium  134*  126*   Potassium  4.5  4.7   Chloride  96*  92*   CO2  22  22   BUN  78.4*  63.3*   Creatinine  4.2*  4.2*   Glucose  229*  427*   Calcium  8.8  8.7     Recent Labs      06/07/17   0549   AST (SGOT)  45*   ALT  19   Alkaline Phosphatase  79   Protein, Total  5.9*   Albumin  3.3*   Bilirubin, Total  0.2       Current  Med's:     Current Facility-Administered Medications   Medication Dose Route Frequency   . albuterol-ipratropium  3 mL Nebulization Q6H [place]   . allopurinol  100 mg Oral Daily   . atorvastatin  20 mg Oral Daily   . docusate sodium  100 mg Oral BID   . fluticasone furoate-vilanterol  1 puff Inhalation QAM   . gabapentin  100 mg Oral TID   . heparin (porcine)  5,000 Units Subcutaneous Q12H [facility]   . influenza  0.5 mL Intramuscular Once   . insulin glargine  30 Units Subcutaneous QHS   . insulin lispro  1-6 Units Subcutaneous QHS   . insulin lispro  10 Units Subcutaneous Once   . insulin lispro  10 Units Subcutaneous TID AC   . insulin lispro  2-10 Units Subcutaneous TID AC   . lactobacillus/streptococcus  1 capsule Oral Daily   . metoprolol succinate XL  25 mg Oral Daily   . NIFEdipine  60 mg  Oral QPM   . pantoprazole  40 mg Oral QAM AC   . predniSONE  20 mg Oral BID Meals       Lines/Drains:     Patient Lines/Drains/Airways Status    Active Lines, Drains and Airways     Name:   Placement date:   Placement time:   Site:   Days:    Peripheral IV 06/02/17 Left Antecubital  06/02/17        Antecubital    6                Assessment:      Condition: Guarded   Systemic inflammatory response syndrome   Bronchitis/pneumonia   Possible acute viral syndrome   Acute gouty arthropathy   Electrolyte imbalance   Congestive heart failure   Hypertension   Diabetes mellitus   Mitral regurgitation   Polymyalgia rheumatica   Acute on Chronic kidney disease   Status post pacemaker placement   Coronary artery disease   Gout     Plan:      We'll continue to monitor without antibio   Continue prednisone   Correction of electrolytes   Continue supportive care   Nephrology follow-up   Discussed with family          Serafina Royals, M.D.,FACP  06/08/2017  8:55 AM          *This note was generated by the Epic EMR system/ Dragon speech recognition and may contain inherent errors or omissions not intended by the user.  Grammatical errors, random word insertions, deletions, pronoun errors and incomplete sentences are occasional consequences of this technology due to software limitations. Not all errors are caught or corrected. If there are questions or concerns about the content of this note or information contained within the body of this dictation they should be addressed directly with the author for clarification

## 2017-06-08 NOTE — Plan of Care (Signed)
Problem: Safety  Goal: Patient will be free from injury during hospitalization  Outcome: Progressing   06/08/17 1144   Goal/Interventions addressed this shift   Patient will be free from injury during hospitalization  Assess patient's risk for falls and implement fall prevention plan of care per policy;Provide and maintain safe environment;Use appropriate transfer methods;Ensure appropriate safety devices are available at the bedside;Include patient/ family/ care giver in decisions related to safety;Hourly rounding     Goal: Patient will be free from infection during hospitalization  Outcome: Progressing   06/08/17 1144   Goal/Interventions addressed this shift   Free from Infection during hospitalization Assess and monitor for signs and symptoms of infection;Monitor lab/diagnostic results       Problem: Pain  Goal: Pain at adequate level as identified by patient  Outcome: Progressing   06/08/17 1144   Goal/Interventions addressed this shift   Pain at adequate level as identified by patient Identify patient comfort function goal;Assess for risk of opioid induced respiratory depression, including snoring/sleep apnea. Alert healthcare team of risk factors identified.;Assess pain on admission, during daily assessment and/or before any "as needed" intervention(s);Evaluate patient's satisfaction with pain management progress;Reassess pain within 30-60 minutes of any procedure/intervention, per Pain Assessment, Intervention, Reassessment (AIR) Cycle;Evaluate if patient comfort function goal is met;Offer non-pharmacological pain management interventions;Include patient/patient care companion in decisions related to pain management as needed       Problem: Renal Instability  Goal: Fluid and electrolyte balance are achieved/maintained  Outcome: Progressing   06/08/17 1144   Goal/Interventions addressed this shift   Fluid and electrolyte balance are achieved/maintained  Monitor intake and output every shift;Monitor/assess lab  values and report abnormal values;Provide adequate hydration;Monitor daily weight;Assess for confusion/personality changes;Assess and reassess fluid and electrolyte status;Observe for seizure activity and initiate seizure precautions if indicated;Observe for cardiac arrhythmias;Monitor for muscle weakness;Follow fluid restrictions/IV/PO parameters     Goal: Nutritional intake is adequate  Outcome: Progressing   06/08/17 1144   Goal/Interventions addressed this shift   Nutritional intake is adequate Monitor daily weights;Assist patient with meals/food selection;Allow adequate time for meals;Encourage/perform oral hygiene as appropriate;Encourage/administer dietary supplements as ordered (i.e. tube feed, TPN, oral, OGT/NGT, supplements);Consult/collaborate with Clinical Nutritionist;Include patient/patient care companion in decisions related to nutrition;Assess anorexia, appetite, and amount of meal/food tolerated       Problem: Diabetes: Glucose Imbalance  Goal: Blood glucose stable at established goal  Outcome: Progressing   06/08/17 1144   Goal/Interventions addressed this shift   Blood glucose stable at established goal  Monitor lab values;Monitor intake and output. Notify LIP if urine output is < 30 mL/hour.;Follow fluid restrictions/IV/PO parameters;Include patient/family in decisions related to nutrition/dietary selections;Assess for hypoglycemia /hyperglycemia;Monitor/assess vital signs;Ensure appropriate diet and assess tolerance;Coordinate medication administration with meals, as indicated;Ensure adequate hydration;Ensure appropriate consults are obtained (Nutrition, Diabetes Education, and Case Management);Ensure patient/family has adequate teaching materials

## 2017-06-08 NOTE — Progress Notes (Signed)
[facility].  Progress Note    Assessment:   AKI 2 to diuresis and realtive hypotension- creatinine stable   Hyponatremia   CHF   DM with CKD   Hypertension with CKD   Anemia   CKD III    Plan:   Hold Lasix   Salt and fluid restriction   Serial labs    D/W grandson    Queen Slough, MD  Office - [phone]  ++++++++++++++++++++++++++++++++++++++++++++++++++++++++++++++  Subjective:  No new complaints, lying flat    Medications:  Scheduled Meds:  Current Facility-Administered Medications   Medication Dose Route Frequency   . albuterol-ipratropium  3 mL Nebulization Q6H [place]   . allopurinol  100 mg Oral Daily   . atorvastatin  20 mg Oral Daily   . docusate sodium  100 mg Oral BID   . fluticasone furoate-vilanterol  1 puff Inhalation QAM   . gabapentin  100 mg Oral TID   . heparin (porcine)  5,000 Units Subcutaneous Q12H [facility]   . influenza  0.5 mL Intramuscular Once   . insulin glargine  30 Units Subcutaneous QHS   . insulin lispro  1-6 Units Subcutaneous QHS   . insulin lispro  10 Units Subcutaneous Once   . insulin lispro  10 Units Subcutaneous TID AC   . insulin lispro  2-10 Units Subcutaneous TID AC   . lactobacillus/streptococcus  1 capsule Oral Daily   . metoprolol succinate XL  25 mg Oral Daily   . NIFEdipine  60 mg Oral QPM   . pantoprazole  40 mg Oral QAM AC   . predniSONE  10 mg Oral BID Meals     Continuous Infusions:    PRN Meds:acetaminophen **OR** acetaminophen, albuterol, Nursing communication: Adult Hypoglycemia Treatment Algorithm **AND** dextrose **AND** dextrose **AND** glucagon (rDNA), magnesium hydroxide, naloxone, ondansetron **OR** ondansetron, traMADol    Objective:  Vital signs in last 24 hours:  Temp:  [97 F (36.1 C)-98.2 F (36.8 C)] 97.3 F (36.3 C)  Heart Rate:  [60-85] 75  Resp Rate:  [16-18] 18  BP: (109-123)/(51-63) 123/61    Intake/Output from yesterday (07:01 - 07:00):  03/05 0701 - 03/06 0700  In: 800 [P.O.:800]  Out: 475 [Urine:475]      Physical Exam:   Gen: WD WN NAD   CV: S1 S2 N RRR   Chest: rales at bases   Ab: ND NT soft no HSM +BS   Ext: 1+ edema    Labs:    Recent Labs  Lab 06/08/17  0621 06/07/17  0549 06/06/17  0611 06/05/17  0524 06/04/17  0623  06/02/17  1155   Glucose 229* 427* 356* 133* 137* More results in Results Review 87   BUN 78.4* 63.3* 47.4* 29.4* 22.2 More results in Results Review 19.1   Creatinine 4.2* 4.2* 4.1* 2.8* 2.1* More results in Results Review 1.6*   Calcium 8.8 8.7 8.9 9.3 9.4 More results in Results Review 9.2   Sodium 134* 126* 133* 131* 125* More results in Results Review 122*   Potassium 4.5 4.7 4.7 5.0 4.8 More results in Results Review 4.2   Chloride 96* 92* 93* 93* 91* More results in Results Review 90*   CO2 22 22 24 23 26  More results in Results Review 25   Albumin  --  3.3*  --  3.7 3.8 More results in Results Review 4.0   Phosphorus  --   --   --   --   --   --  3.8   Magnesium  --   --   --   --   --   --  1.8   More results in Results Review = values in this interval not displayed.    Recent Labs  Lab 06/07/17  0549 06/06/17  0611 06/05/17  0524   WBC 10.47* 7.00 8.73   Hgb 9.9* 10.4* 11.7*   Hematocrit 29.4* 30.7* 34.1*   MCV 89.6 89.0 88.3   MCH 30.2 30.1 30.3   MCHC 33.7 33.9 34.3   RDW 13 13 13    MPV 10.1 10.0 10.3   Platelets 205 171 186

## 2017-06-08 NOTE — Consults (Signed)
See note entry by case manager, Jacklynn Barnacle, on 06/06/17.

## 2017-06-08 NOTE — Progress Notes (Signed)
[facility]    Date Time: 06/08/17 1:30 PM  Patient Name: Marco Bruce         Assessment:      Acute on chronic diastolic congestive heart failure.     Left ventricular ejection fraction 75% by echocardiogram in 2017, reduced to 40% by echocardiogram in May of 2018.   Moderate to severe valvular disease.    Moderate to severe mitral valve regurgitation.    Mild aortic valve regurgitation.    Pulmonary artery pressure 56 mmHg in May 2018.    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   Acute on chronic kidney disease.  Rising Cr up to 4.1 from 1.7 on admission   Hypertension.    Diabetes.    Anemia.    Former tobacco use.    Hyponatremia   SIRS    Recommendations:    Continue current treatments.  Clinically euvolemic on exam, lying flat, no edema, no SOB.  Diuretics currently on hold due to worsening bun/creat.     Holding ARB due to ARF    Will s/o and arrange outpatient follow up    Medications:      Scheduled Meds: PRN Meds:        albuterol-ipratropium 3 mL Nebulization Q6H [place]   allopurinol 100 mg Oral Daily   atorvastatin 20 mg Oral Daily   docusate sodium 100 mg Oral BID   fluticasone furoate-vilanterol 1 puff Inhalation QAM   gabapentin 100 mg Oral TID   heparin (porcine) 5,000 Units Subcutaneous Q12H [place]   insulin glargine 30 Units Subcutaneous QHS   insulin lispro 1-6 Units Subcutaneous QHS   insulin lispro 10 Units Subcutaneous Once   insulin lispro 10 Units Subcutaneous TID AC   insulin lispro 2-10 Units Subcutaneous TID AC   lactobacillus/streptococcus 1 capsule Oral Daily   metoprolol succinate XL 25 mg Oral Daily   NIFEdipine 60 mg Oral QPM   pantoprazole 40 mg Oral QAM AC   predniSONE 10 mg Oral BID Meals       Continuous Infusions:     acetaminophen 650 mg Q4H PRN   Or     acetaminophen 650 mg Q4H PRN   albuterol 2 puff Q4H PRN   dextrose 15 g of glucose PRN   And     dextrose 12.5 g PRN   And     glucagon (rDNA) 1 mg PRN   guaiFENesin 200 mg Q4H  PRN   magnesium hydroxide 30 mL Daily PRN   naloxone 0.2 mg PRN   ondansetron 4 mg Q6H PRN   Or     ondansetron 4 mg Q6H PRN   traMADol 50 mg Q6H PRN             Subjective:    No chest pain.  Feels better        Physical Exam:     Vitals:    06/08/17 1015   BP: 123/61   Pulse: 75   Resp: 18   Temp: 97.3 F (36.3 C)   SpO2: 93%     Temp (24hrs), Avg:97.4 F (36.3 C), Min:97 F (36.1 C), Max:98.1 F (36.7 C)      Telemetry reviewed no changes. : paced     Intake and Output Summary (Last 24 hours) at Date Time    Intake/Output Summary (Last 24 hours) at 06/08/17 1330  Last data filed at 06/08/17 1000   Gross per 24 hour   Intake  490 ml   Output              200 ml   Net              290 ml       General Appearance:  Breathing comfortable  Head:  normocephalic  Eyes:  EOM's intact  Neck:  No carotid bruit;  brisk carotid upstroke  Lungs:  Relatively clear  Chest Wall:  No tenderness or deformity  Heart:  S1, S2 normal, no S3, no S4, 2/6 systolic murmur, PMI not displaced, no rub   Abdomen:  Soft, non-tender, positive bowel sounds  Extremities:  No cyanosis or clubbing; 1+ edema  Pulses:  Equal radial pulses, 4/4 symmetric  Neurologic:  Affect wnl  Musculoskeletal: normal strength and tone    Labs:       Recent Labs  Lab 06/02/17  1928 06/02/17  1614 06/02/17  1155 06/02/17  1154   Creatine Kinase (CK)  --   --   --  156   Troponin I 0.02 0.02 0.01  --                Recent Labs  Lab 06/07/17  0549   Bilirubin, Total 0.2   Protein, Total 5.9*   Albumin 3.3*   ALT 19   AST (SGOT) 45*       Recent Labs  Lab 06/02/17  1155   Magnesium 1.8           Recent Labs  Lab 06/07/17  0549 06/06/17  0611 06/05/17  0524   WBC 10.47* 7.00 8.73   Hgb 9.9* 10.4* 11.7*   Hematocrit 29.4* 30.7* 34.1*   Platelets 205 171 186       Recent Labs  Lab 06/08/17  0621 06/07/17  0549 06/06/17  0611   Sodium 134* 126* 133*   Potassium 4.5 4.7 4.7   Chloride 96* 92* 93*   CO2 22 22 24    BUN 78.4* 63.3* 47.4*   Creatinine 4.2* 4.2*  4.1*   EGFR 13.4 13.4 13.8   Glucose 229* 427* 356*   Calcium 8.8 8.7 8.9       Recent Labs  Lab 06/02/17  1154   TSH 2.54       .  Lab Results   Component Value Date    BNP 411.1 (H) 06/02/2017      Estimated Creatinine Clearance: 10.6 mL/min (A) (based on SCr of 4.2 mg/dL (H)).    Weight Monitoring 06/02/2017 06/03/2017 06/03/2017 06/03/2017 06/04/2017 06/06/2017 06/07/2017   Height - - - 157.5 cm - - -   Height Method - - - - - - -   Weight 76.522 kg 76.613 kg 76.2 kg - 74.753 kg 73.483 kg 72.122 kg   Weight Method Bed Scale Bed Scale Standing Scale - Bed Scale Bed Scale Bed Scale   BMI (calculated) - - - - - - -         Imaging:   Xr Chest 2 Views    Result Date: 06/05/2017   New patchy airspace opacities in the left basal indicative of atelectasis or pneumonia in the correct clinical setting. Ferd Hibbs, MD 06/05/2017 10:32 AM    Xr Chest 2 Views    Result Date: 06/02/2017   Stable prominent heart. Darnelle Bos, MD 06/02/2017 1:01 PM    X-ray Chest Pa And Lateral    Result Date: 05/28/2017   Stable elevation of the right hemidiaphragm. No  acute disease. Elwanda Brooklyn, MD 05/28/2017 12:01 PM    Xr Knee 3 View Left    Result Date: 06/03/2017  1. No acute findings or evidence of erosion. 2. Tri compartment degenerative arthritis greatest medially. Moderate joint effusion. Lupita Leash, MD 06/03/2017 3:45 PM    Hip Right Ap And Lateral With Pelvis    Result Date: 06/02/2017   No evidence of fracture. Darnelle Bos, MD 06/02/2017 1:00 PM    Xr Chest Ap Portable    Result Date: 06/04/2017   Clear lungs. Unchanged mild cardiac enlargement. Ferd Hibbs, MD 06/04/2017 1:28 PM                  Signed by: Lorelee Cover, [place]  NP [place] (8am-5pm)  MD Spect[employer] [phone] (8am-5pm)  After hours, non urgent consult line [phone]  After Hours, urgent consults [phone]

## 2017-06-08 NOTE — Progress Notes (Signed)
Pt is doing better he's more active and moving more, was out of bed 2 times overnight.   Suppository given due to no BM for 5 days,  he had a good result.   Bladder scan @midnight  showed 296, pt later voided 200cc on his own an hour later.   blood sugar is normalizing, at 9pm BG was 163, he was  medicated with   scheduled lantus, 30units. At 3am  BG was 217, no coverage was given at that tim to avoid becoming hypoglycemic. Will continue to monitor Blood sugar, kidney function and joint pains closely.

## 2017-06-08 NOTE — Progress Notes (Signed)
[facility]PITAL[facility]  Progress Note  Patient Info:   Date/Time: 06/08/2017 / 8:52 AM   Admit Date:06/02/2017  Patient Name:Marco Bruce   EUM:[id number]   PCP: Darlina Sicilian June, DO  Attending Physician:Frederick Klinger, Raeanne Barry, MD     Assessment and Plan:     84 yo with PMH of PMR on chronic prednisone; presents with bilateral knee pain, fever, acute on chronic diastolic heart failure on IV lasix with worsening renal function.      Acute on chronic diastolic heart failure - Now Euvolemic and Asymptomatic  - Was initially treated with Lasix 40 IV daily for 3-4 days.  Currently diuretic held due to worsening renal function.  Cardiology and renal on board.  - Left ventricular ejection fraction 75% by echocardiogram in 2017, reduced to 40% by echocardiogram in May of 2018.  - Moderate to severe mitral valve regurgitation.  - Mild aortic valve regurgitation.  - Pulmonary artery pressure 56 mmHg in May 2018.  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    Acute renal failure on chronic kidney disease III: Baseline ~1.7, 4.2 today,  likely due to aggressive diuresis and hypotension. Stopped hydralazine to allow some increase in BP.    - Continue to hold Lasix, received 1 Lit bolus on 06/06/17    SIRS, Possible acute viral syndrome.  Procalcitonin: 0.19, Respiratory pathogen panel PCR negative: Fevers improved  - was initially started empirically on azithromycin and ceftriaxone - stopped antibiotics on 06/07/17 by ID    Bilateral severe knee pain: Possible acute gout arthropathy L>R: L knee xray shows tricompartment degenerative arthritis with moderate joint effusion.  Seems chronic.  Doesn't look infected. apprec ortho's input.  Empirically placed on prednisone 30 mg twice a day - will taper as tolerated, reduced to 10 BID today and will change to 5 BID tomm.     Type 2 diabetes with uncontrolled hyperglycemia: Due to prednisone. Need to monitor glucose closely due to the possibility of hypoglycemia given worsening renal function. BS  in 400's today, increased Lantus to 30 HS and add 10 units TID with SSI - BS better controlled now - hopefully will continue to improve after reducing prednisone dose  AIC is 7.6    Hyponatremia:  Likely due to volume overload from CHF - improved now     PMR: normally takes prednisone 5 mg daily, which was held couple days ago resulting in worsening knee pain, now on Prednisone 10 BID will taper as tolerated    Leucocytosis - likely due to steroids     Anemia - likely due to CKD    Constipation - started on Colace, Dulcolax and MOM prn    Urinary retention - likely due to condom cath and constipation, resolved now     Disp - PT suggested SNF, but family has decided to take her home with home health.       DVT Prohylaxis:heparin   Central Line/Foley Catheter/PICC line status: none  Code Status: Full Code  Disposition:home  Type of Admission:Inpatient  Expected Date of Discharge: 2-3 days   Milestones required for discharge:see above    Hospital Problems:   Active Problems:    Stage 4 chronic kidney disease    Acute diastolic heart failure    Hyponatremia    Type 2 diabetes mellitus with complication, with long-term current use of insulin    Acute gout of left knee    Fever and chills    Acute renal failure superimposed on stage 4 chronic  kidney disease    Type 2 diabetes mellitus with hyperglycemia    Subjective:   06/08/17 Pt denies CP,SOB, palpitations. Knee pain is improving. Passing urine well after condom cath was removed. Was lying flat in the bed when I was examining him, did not have any shortness of breath or discomfort.  No swelling in both feet  Chief Complaint:  Hip Pain; Fever; and Joint Pain    Review of Systems   Constitutional: Negative for chills and fever.   HENT: Negative for hearing loss and tinnitus.    Respiratory: Negative for cough, hemoptysis, shortness of breath and wheezing.    Cardiovascular: Negative for chest pain and palpitations.   Gastrointestinal: Negative for heartburn, nausea and  vomiting.   Genitourinary: Negative for dysuria and urgency.   Musculoskeletal:        Bilateral knee pain and swelling - slowly improving   Skin: Negative for itching and rash.   Neurological: Negative for dizziness and headaches.   Psychiatric/Behavioral: Negative for depression and suicidal ideas.     Objective:     Vitals:    06/07/17 2119 06/08/17 0140 06/08/17 0236 06/08/17 0523   BP: 119/60 120/62  109/60   Pulse: 85 81 83 60   Resp: 18 18 17 16    Temp: 97.3 F (36.3 C) 97 F (36.1 C)  97.5 F (36.4 C)   TempSrc: Oral Temporal Artery     SpO2: 95% 96% 96% 97%   Weight:       Height:         Physical Exam:   Physical Exam   Constitutional: He is oriented to person, place, and time and well-developed, well-nourished, and in no distress.   HENT:   Head: Normocephalic and atraumatic.   Cardiovascular: Normal rate and regular rhythm.    Pulmonary/Chest: Effort normal. No respiratory distress. He has no wheezes. He has rales (right base).   Abdominal: Soft. Bowel sounds are normal. He exhibits no distension. There is no tenderness. There is no rebound.   Musculoskeletal:   Bilateral knee swelling, erythema and tenderness   Neurological: He is alert and oriented to person, place, and time.   Skin: Skin is warm and dry.   Nursing note and vitals reviewed.    Results of Labs/imaging   Labs and radiology reports have been reviewed.    Hospitalist   Signed by:   Sherre Scarlet  06/08/2017 8:52 AM    *This note was generated by the Epic EMR system/ Dragon speech recognition and may contain inherent errors or omissions not intended by the user. Grammatical errors, random word insertions, deletions, pronoun errors and incomplete sentences are occasional consequences of this technology due to software limitations. Not all errors are caught or corrected. If there are questions or concerns about the content of this note or information contained within the body of this dictation they should be addressed directly with the  author for clarification

## 2017-06-08 NOTE — Progress Notes (Deleted)
CM round with attending, anticipate discharger in 1-2-days.     Spoke with patient son at bedside, he prefers [facility] home health services, does not prefer [facility] home health services this admission. CM referred to [id number] to change home health services to [facility].     CM will continue to follow up as needed.     Dahlia Bailiff, RN, BSN  Case Manager- [facility] ([facility])  [phone]

## 2017-06-08 NOTE — Progress Notes (Signed)
Chaplaincy Services volunteer provided spiritual care through visitation.  Chaplain available for additional support if needed/desired.    Stephana Morell, BCC, MDiv, DMin   Chaplaincy Manager  [facility]  Chaplaincy Services  [phone]  **Page Chaplain 24/7 at [phone]**

## 2017-06-08 NOTE — Plan of Care (Signed)
Problem: Renal Instability  Goal: Fluid and electrolyte balance are achieved/maintained  Outcome: Progressing   06/08/17 0352   Goal/Interventions addressed this shift   Fluid and electrolyte balance are achieved/maintained  Monitor intake and output every shift;Monitor/assess lab values and report abnormal values;Provide adequate hydration;Monitor daily weight;Assess for confusion/personality changes;Assess and reassess fluid and electrolyte status;Observe for seizure activity and initiate seizure precautions if indicated;Observe for cardiac arrhythmias;Monitor for muscle weakness;Follow fluid restrictions/IV/PO parameters

## 2017-06-08 NOTE — Plan of Care (Signed)
Problem: Safety  Goal: Patient will be free from injury during hospitalization  Outcome: Progressing   06/08/17 2056   Goal/Interventions addressed this shift   Patient will be free from injury during hospitalization  Assess patient's risk for falls and implement fall prevention plan of care per policy;Provide and maintain safe environment;Ensure appropriate safety devices are available at the bedside;Include patient/ family/ care giver in decisions related to safety;Assess for patients risk for elopement and implement [place] per policy;Provide alternative method of communication if needed (communication boards, writing)

## 2017-06-09 DIAGNOSIS — E1122 Type 2 diabetes mellitus with diabetic chronic kidney disease: Secondary | ICD-10-CM

## 2017-06-09 DIAGNOSIS — F0390 Unspecified dementia without behavioral disturbance: Secondary | ICD-10-CM

## 2017-06-09 LAB — GFR: EGFR: 18.4

## 2017-06-09 LAB — GLUCOSE WHOLE BLOOD - POCT
Whole Blood Glucose POCT: 219 mg/dL — ABNORMAL HIGH (ref 70–100)
Whole Blood Glucose POCT: 187 mg/dL — ABNORMAL HIGH (ref 70–100)
Whole Blood Glucose POCT: 204 mg/dL — ABNORMAL HIGH (ref 70–100)
Whole Blood Glucose POCT: 88 mg/dL (ref 70–100)
Whole Blood Glucose POCT: 94 mg/dL (ref 70–100)

## 2017-06-09 LAB — BASIC METABOLIC PANEL
Anion Gap: 9 (ref 5.0–15.0)
BUN: 84.8 mg/dL — ABNORMAL HIGH (ref 9.0–28.0)
CO2: 27 mEq/L (ref 22–29)
Calcium: 9.1 mg/dL (ref 7.9–10.2)
Chloride: 99 mEq/L — ABNORMAL LOW (ref 100–111)
Creatinine: 3.2 mg/dL — ABNORMAL HIGH (ref 0.7–1.3)
Glucose: 209 mg/dL — ABNORMAL HIGH (ref 70–100)
Potassium: 4.7 mEq/L (ref 3.5–5.1)
Sodium: 135 mEq/L — ABNORMAL LOW (ref 136–145)

## 2017-06-09 LAB — CBC
Absolute NRBC: 0 10*3/uL (ref 0.00–0.00)
Hematocrit: 28.6 % — ABNORMAL LOW (ref 37.6–49.6)
Hgb: 9.8 g/dL — ABNORMAL LOW (ref 12.5–17.1)
MCH: 30 pg (ref 25.1–33.5)
MCHC: 34.3 g/dL (ref 31.5–35.8)
MCV: 87.5 fL (ref 78.0–96.0)
MPV: 9.7 fL (ref 8.9–12.5)
Nucleated RBC: 0 /100 WBC (ref 0.0–0.0)
Platelets: 231 10*3/uL (ref 142–346)
RBC: 3.27 10*6/uL — ABNORMAL LOW (ref 4.20–5.90)
RDW: 13 % (ref 11–15)
WBC: 8.13 10*3/uL (ref 3.10–9.50)

## 2017-06-09 NOTE — Progress Notes (Signed)
[place] Disposition: Home w/HHC Rn, PT, OT + MD FU.   Expected [place] date: TBD  Transport: Patient family?     Chart reviewed and MDRs completed. Patient may be ready for discharge today. Left message with daughter to discuss final discharge plans.     Will continue to follow for [place] needs.     Wojciech Willetts E. [place], MSW  Clinical Case Manager  [facility]  [phone]  After Hours + Weekend  [phone]

## 2017-06-09 NOTE — Plan of Care (Signed)
Problem: Everyday - Heart Failure  Goal: Stable Vital Signs and Fluid Balance  Outcome: Progressing   06/09/17 1942   Goal/Interventions addressed this shift   Stable Vital Signs and Fluid Balance Daily Standing Weights in the morning using the same scale, after using the bathroom and before breadfast. If unable to stand, zero the bed and use the bed scale;Monitor, assess vital signs and telemetry per policy;Monitor labs and report abnormalities to physician;Strict Intake/Output;Fluid Restriction;Assess for swelling/edema;Wean oxygen as needed if appropriate     Goal: Mobility/Activity is Maintained at Optimal Level for Patient  Outcome: Progressing   06/09/17 1942   Goal/Interventions addressed this shift   Mobility/Activity is Maintained at Optimal Level for Patient Increase mobility as tolerated/progressive mobility protocol;Maintain SCD's as Ordered;Perform active/passive ROM;Reposition patient every 2 hours and as needed unless able to reposition self;Assess for changes in respiratory status, level of consciousness and/or development of fatigue;Consult with Physical Therapy and/or Occupational Therapy     Goal: Nutritional Intake is Adequate  Outcome: Progressing   06/09/17 1942   Goal/Interventions addressed this shift   Nutritional Intake is Adequate Cardiac diet-2 gm Sodium;Fluid Restricction if needed;Patient and family teaching on low sodium diet;Assess appetite,anorexia and amount of meal/food tolerated;Encourage/perform oral hygiene as appropriate     Goal: Teaching-Using CHF Warning Zones and Educational Videos  Outcome: Progressing   06/09/17 1942   Goal/Interventions addressed this shift   Teaching-Using CHF Warning Zones and Educational Videos Signs & Symptoms of CHF;Daily Standing Weights & record;Fluid Restriction if appropriate;CHF Warning Zones and when to call for help;Medications;Sodium Restriction

## 2017-06-09 NOTE — PT Progress Note (Signed)
[facility]  [place]    Department of Rehabilitation  [phone]    Physical Therapy Daily Treatment Note    Patient: Marco Bruce    MRN#: [id number]   [id number]/[id number]  Time of Treatment: Start Time: 4497 Stop Time: 5300  Time Calculation: 9 min  PT Visit Number: 3  Patient's medical condition is appropriate for Physical Therapy intervention at this time.      Assessment:   Pt may benefit from [facility] to ensure safe transition home, appears to be near functional baseline and safe to return home with family supervision.  No further acute PT needs identified      Patient Goal: To return home      Plan:  Discharge acute PT services   Based on today's session patient's discharge recommendation is the following:  Home with supervision and Home Health PT        Subjective: Patient is agreeable to participation in the therapy session, reports he has no knee pain.  Grandson present and tranlated per pt preference.  Grandson agreeable to patient's participation in the therapy session.  Nursing clears patient for therapy.   Pain Assessment: No/denies pain       Objective:  Observation of Patient/Vital Signs:  Reviewed patients chart and vital signs being monitored by nursing staff, appear to be within normal range and therapeutic parameters.  Patient is in bed and seated in a bedside chair.  Pt seen for functional activities and exercises as noted:      Cognition/Neuro Status  Arousal/Alertness: Appropriate responses to stimuli  Attention Span: Appears intact  Following Commands: Follows one step commands with increased time  Safety Awareness: minimal verbal instruction  Insights: Fully aware of deficits  Behavior: attentive;calm;cooperative    Functional Mobility  Sit to Stand: Supervision  Stand to Sit: Supervision     Locomotion  Ambulation: Modified Independent;Supervision;with front-wheeled walker  Pattern: decreased cadence;decreased step length  Stair Management: Contact  Guard Assist;Stand by Assist;alternating pattern;two rails  Number of Stairs: 4     Therapeutic Exercise  Strengthening: patient education - pt demonstrated LE exercises to show what exercises he has been doing    Neuro Re-Ed  Sitting Balance: supervision  Standing Balance: moderate assist;with support;with instruction    Educated the patient to role of physical therapy, plan of care, goals of therapy and HEP, safety with mobility and home safety.  Patient left reclined in chair, without needs and call bell within reach. Grandson present.  RN notified of session outcome.

## 2017-06-09 NOTE — Progress Notes (Addendum)
Home Health Referral            By [employer], the patient has the right to freely choose a home care provider.  Arrangements have been made with:     A company of the patients choosing. We have supplied the patient with a listing of providers in your area who asked to be included and participate in Medicare.   The preferred provider of your insurance company. Choosing a home care provider other than your insurance company's preferred provider may affect your insurance coverage.      Home Health Discharge Information     Your doctor has ordered Skilled Nursing, Physical Therapy and Occupational Therapy in-home service(s) for you while you recuperate at home, to assist you in the transition from hospital to home.    The agency that you or your representative chose to provide the service:  Name of [place] Placement: [facility] [phone]    The above services were set up by:  Hansel Starling  ([place])   Phone      [phone]    Additional comments:   If you have not heard from your home health agency within 24-48 hours after discharge please call your agency to arrange a time for your first visit.  For any scheduling concerns or questions related to home health, such as time or date please contact your home health agency at the number listed above.     Home Health face-to-face (FTF) Encounter (Order [id number])   Consult   Date: 06/09/2017 Department: [facility] Ordering/Authorizing: Sadie Haber, MD   Order Information     Order Date/Time Release Date/Time Start Date/Time End Date/Time   06/09/17 01:29 PM None 06/09/17 01:25 PM 06/09/17 01:25 PM   Order Details     Frequency Duration Priority Order Class   Once 1 occurrence Routine Hospital Performed   Standing Order Information     Remaining Occurrences Interval Last Released      0/1 Once 06/09/2017            Provider Information     Ordering User Ordering Provider Authorizing Provider   Hansel Starling,  RN Sadie Haber, MD Sadie Haber, MD   Attending Provider(s) Admitting Provider PCP   Bedelia Person, MD; Melbourne Abts, MD; Clayborne Artist, MD; Carney Bern, MD; Sherre Scarlet, MD; Sadie Haber, MD Melbourne Abts, MD Darlina Sicilian June, DO   Verbal Order Info     Action Created on Order Mode Entered by Responsible Provider Signed by Signed on   Ordering 06/09/17 1329 Telephone with Lolita Patella, Orlene Plum, RN Sadie Haber, MD     Order Questions     Question Answer Comment   Date of face-to-face (FTF) encounter: 06/09/2017    Medical conditions that necessitate Home Health care: B. Functional impairment due to recent hospitalization/procedure/treatment     C. Risk for complication/infection/pain requiring follow up and monitoring     D. Chronic illness & risk for re-hospitalization due to unstable disease status     E. Exacerbation of disease requiring follow up monitoring     F. New diagnosis & treatment requiring follow up monitoring and management     G. High risk/complex medications requiring instruction and management     H. Multiple new medications requiring management and monitoring    Clinical findings that support the need for Skilled Nursing. SN will: C. Monitor for signs and symptoms of exacerbation of disease and management  D. Review medication reconciliation, manage and educate on use and side effects     G. Educate on new diagnosis, treatment & management to prevent re-hospitalization     H. Assess cardiopulmonary status and monitor for signs &symptoms of exacerbation     I. Educate dietary and or fluid restrictions and weight management    Clinical findings that support the need for Physical Therapy. PT will A. Evaluate and treat functional impairment and improve mobility     C. Educate on weight bearing status, stair/gait training, balance & coordination     D. Provide services to help restore function, mobility, and releive pain     E.  Educate on functional mobility; bed, chair, sit, stand and transfer activities     F. Perform home safety assessment & develop safe in home exercise program     G. Implement activities to improve stance time, cadence & step length     H. Educate on the safe use of assistive device/ durable medical equipment     I. Instruct on restorative activities to restore ability to perform ADL    Clinical findings support the need for OT (needs SN/PT order).OT will A. Develop in home program to improve ability to perform ADLs     B. Develop restorative program to improve mobility and independence     C. Educate on recovery and maintenance skills     D. Instruct on strategies to compensate for loss of function     E. Educate on basic motor function and reasoning abilities    Clinical findings that support the need for SLP. ST will F. N/A    Per clinical findings, following services are medically necessary: Skilled Nursing     PT     OT    Evidence this patient is homebound because: B. Profound weakness, poor balance/unsteady gait d/t illness/treatment/procedure     C. Decreased endurance, strength, ROM, cadence, safety/judgment during mobility     F. Deconditioned due to advance disease process requiring assistance to leave home     G. Fall risk due to impaired coordination, gait and decreased balance     I. Restricted to home to decrease risk of infection     N. Impaired mobility d/t pain, arthritis, weakness that compromises patient safety    Other (please specify) PCP Dr Darlina Sicilian June          Process Instructions     Please select [place] medically necessary.     Based on the above findings, I certify that this patient is confined to the home and needs intermittent skilled nursing care, physical therapry and / or speech therapy or continues to need occupational therapy. The patient is under my care, and I have initiated the establishment of the plan of care. This patient will be followed by a  physician who will periodically review the plan of care.         Collection Information     Consult Order Info     ID Description Priority Start Date Start Time   [id number] [place] face-to-face (FTF) Encounter Routine 06/09/2017 1:25 PM   Provider Specialty Referred to   ______________________________________ _____________________________________   Acknowledgement Info     For At Acknowledged By Acknowledged On   Placing Order 06/09/17 1329 Hansel Starling, RN 06/09/17 1329   Verbal Order Info     Action Created on Order Mode Entered by Responsible Provider Signed by Signed on   Ordering 06/09/17 1329 Telephone  with readback Hansel Starling, RN Sadie Haber, MD     Patient Information     Patient Name  Jesiel, Garate Sex  Male DOB  02-11-1927   Additional Information     Associated Reports External References   Priority and Order Details [place]       Patient's Name: [facility] Date:06/10/2017   DOB: Oct 18, 1926 From:   Derry Skill RN, [place]  [facility]  [phone]     MRN: [id number] For Physician: PCP Dr Darlina Sicilian June   Attending Physician: Sadie Haber, MD For SN,PT, OT     Contact 1. Seabron, Iannello [phone] cell   2. Daughter Mohanad, Carsten [phone] cell     Language/Communication Needs:   Pt speak cambodian. Pt has an aide 8hrs/day that speaks english     Isolation Precautions:   No active isolations      Diagnoses:     Patient Active Problem List    Diagnosis Date Noted   . HType 2 diabetes mellitus with hyperglycemia [E11.65] 06/06/2017   . HAcute gout of left knee [M10.9] 06/05/2017   . HFever and chills [R50.9] 06/05/2017   . HAcute renal failure superimposed on stage 4 chronic kidney disease [N17.9, N18.4] 06/05/2017   . Generalized weakness [R53.1]    . Acute cystitis without hematuria [N30.00] 03/19/2017   . Mixed hyperlipidemia [E78.2]    . HType 2 diabetes mellitus with complication, with long-term current use of  insulin [E11.8, Z79.4]      Chronic   . Hypokalemia [E87.6] 03/18/2017   . HHyponatremia [E87.1] 08/13/2016   . Essential hypertension [I10] 08/13/2016     Chronic   . Acute on chronic diastolic congestive heart failure [I50.33] 08/13/2016   . 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 HStage 4 chronic kidney disease [N18.4]      Chronic   . HAcute diastolic heart failure [Y63.78]    . Acute renal failure [N17.9] 05/26/2015       Admission Date:   06/02/2017    Discharge Date:   06/09/2017    Recent Surgery, Date of Surgery, and Provider Performing:     * Surgery not found *    Allergies:   No Known Allergies    Height and Weight:     Ht Readings from Last 1 Encounters:   06/03/17 1.575 m (5\' 2" )     Wt Readings from Last 1 Encounters:   06/07/17 72.1 kg (159 lb)       Immunizations:     Most Recent Immunizations   Administered Date(s) Administered   . INFLUENZA HIGH DOSE 68YRS+ 06/08/2017       Past Medical History:        Past Medical History:   Diagnosis Date   . Acute diastolic heart failure 58/85/0277   . Acute on chronic diastolic congestive heart failure 08/13/2016   . Acute pulmonary edema 05/26/2015   . ARF (acute renal failure) 05/26/2015   . Chronic diastolic heart failure 41/28/7867   . Chronic gout    . Chronic pain syndrome 07/07/2015   . CKD (chronic kidney disease) stage 4, GFR 15-29 ml/min     2018 baseline creatinine in EPIC is 1.5-2.3.   . Congestive heart failure (CHF)     EF 40% 5/18 echo, on lasix   . Current chronic use of systemic steroids     For Polymyalgia rheumatica   .  Essential hypertension    . Mixed hyperlipidemia    . NSTEMI (non-ST elevated myocardial infarction) 08/13/2016   . Osteoarthritis of both knees    . Peripheral edema 06/16/2015   . Pneumonia due to infectious organism, unspecified laterality, unspecified part of lung 06/25/2015   . Polymyalgia rheumatica     chronic steroids   . 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 Second degree heart block by electrocardiogram (ECG)  04/27/2015   . Type 2 diabetes mellitus with complication, with long-term current use of insulin     12/01/2016 Hemoglobin A1c 8.2%       Past Surgical History:     Past Surgical History:   Procedure Laterality Date   . ABDOMINAL SURGERY     . CHOLECYSTECTOMY     . PACEMAKER Left 2017       Social History:     Social History     Social History   . Marital status: Widowed     Spouse name: N/A   . Number of children: N/A   . Years of education: N/A     Social History Main Topics   . Smoking status: Never Smoker   . Smokeless tobacco: Never Used   . Alcohol use No      Comment: former alcoholic   . Drug use: No   . Sexual activity: Not on file     Other Topics Concern   . Not on file     Social History Narrative   . No narrative on file       Willing and Able Caregiver:   Yes    SKILLED NURSING (SN): Skilled assessment and medication instruction   Check Pulse Ox PRN   Patient Info:   Date/Time: 06/08/2017 / 8:52 AM   Admit Date:06/02/2017  Patient Name:Marco Bruce   BVQ:[id number]   PCP: Darlina Sicilian June, DO  Attending Physician:Jadiga, Raeanne Barry, MD     Assessment and Plan:     84 yo with PMH of PMR on chronic prednisone; presents with bilateral knee pain, fever, acute on chronic diastolic heart failure on IV lasix with worsening renal function.     Acute on chronic diastolic heart failure - Now Euvolemic and Asymptomatic  - Was initially treated with Lasix 40 IV daily for 3-4 days. Currently diuretic held due to worsening renal function. Cardiology and renal on board.  - Left ventricular ejection fraction 75% by echocardiogram in 2017, reduced to 40% by echocardiogram in May of 2018.  - Moderate to severe mitral valve regurgitation.  - Mild aortic valve regurgitation.  - Pulmonary artery pressure 56 mmHg in May 2018.  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    Acute renal failure on chronic kidney disease III: Baseline ~1.7, 4.2 today, likely due to aggressive diuresis and hypotension. Stopped hydralazine to  allow some increase in BP.   - Continue to hold Lasix, received 1 Lit bolus on 06/06/17    SIRS, Possible acute viral syndrome. Procalcitonin: 0.19, Respiratory pathogen panel PCR negative: Fevers improved  - was initially started empirically on azithromycin and ceftriaxone - stopped antibiotics on 06/07/17 by ID    Bilateral severe knee pain: Possible acute gout arthropathy L>R: L knee xray shows tricompartment degenerative arthritis with moderate joint effusion. Seems chronic. Doesn't look infected. apprec ortho's input. Empirically placed on prednisone 30 mg twice a day - will taper as tolerated, reduced to 10 BID today and will change to 5 BID tomm.     Type  2 diabetes with uncontrolled hyperglycemia: Due to prednisone.Need to monitor glucose closely due to the possibility of hypoglycemia given worsening renal function. BS in 400's today, increased Lantus to 30 HS and add 10 units TID with SSI - BS better controlled now - hopefully will continue to improve after reducing prednisone dose  AIC is 7.6    Hyponatremia: Likely due to volume overload from CHF - improved now     PMR: normally takes prednisone 5 mg daily, which was held couple days ago resulting in worsening knee pain, now on Prednisone 10 BID will taper as tolerated    Leucocytosis - likely due to steroids     Anemia - likely due to CKD    Constipation - started on Colace, Dulcolax and MOM prn    Urinary retention - likely due to condom cath and constipation, resolved now         PHYSICAL THERAPY (PT):   Eval and treat  Functional Mobility  Supine to Sit: Minimal Assist;to Right;HOB raised;Increased Time;Increased Effort  Scooting to EOB: Minimal Assist  Sit to Stand: Moderate Assist  Stand to Sit: Moderate Assist  Transfers  Bed to Chair Stand Pivot Transfers: Moderate Assist to Right, turning steps 1 foot from bed to chair  Device Used for Functional Transfer: front-wheeled walker      Seated Therapeutic Exercise  Ankle Pumps:  B x 25-30    LAQ:  B x 10  Hip Flexion: B x 10  Hip Adduction Squeeze w/ pillow: x 10  Pt instructed in proper pacing and performance of ex's with focus and facilitation on correct LE positioning and proper cadence to maximize quality of each exercise.     Neuro Re-Ed  Sitting Balance: supervision at edge of bed  Standing Balance: moderate assist with support of RW and with instruction for turning steps bed to chair     Treatment Activities: Bed mobility, transfers, balance assessment, LE ROM and strength ex's, safety awareness and cognition assessed, and activity tolerance noted.      Educated the patient to role of physical therapy, plan of care, goals of therapy and safety with mobility.  Encouraged patient to perform LE exercises for circulation and to decrease effects of immobility.    OCCUPATIONAL THERAPY (OT):   Eval.  and treat  Cognition/Neuro Status  Arousal/Alertness: Appropriate responses to stimuli  Attention Span: Appears intact  Orientation Level: Oriented to place;Oriented to situation;Oriented to person  Memory: Decreased short term memory;Decreased recall of precautions  Following Commands: minimal verbal instruction  Safety Awareness: minimal verbal instruction  Insights: Educated in Surveyor, mining;Fully aware of deficits  Problem Solving: minimal assistance  Behavior: attentive;calm;cooperative  Motor Planning: decreased processing speed  Coordination: intact  Hand Dominance: right handed    Gross ROM  Gross ROM: within functional limits  Right Upper Extremity ROM: within functional limits  Left Upper Extremity ROM: within functional limits  Right Lower Extremity ROM:  (limited see PT note)  Left Lower Extremity ROM:  (limited see PT note)  Gross Strength  Right Upper Extremity Strength: 3+/5  Left Upper Extremity Strength: 3+/5  Right Lower Extremity Strength:  (limited see PT note)  Left Lower Extremity Strength:  (limited see PT note)  Tone: within functional limits    Sensory  Auditory:  intact  Visual Acuity: intact;wears glasses  Vision - Complex Assessment  Acuity: Able to read clock/calendar on wall without difficulty    Self-care and Home Management  Eating: Minimal Assist (per family, pt  has needs assistance)  Grooming: Stand by Assist;in chair;verbal prompting;increased time to complete;wash/dry hands;wash/dry face  LB Dressing: Dependent;Don/doff R sock;Don/doff L sock  Functional Transfers: Moderate Assist    Mobility and Transfers  Sit to Stand: Moderate Assist;Increased Time;Increased Effort (w/RW)  Bed to Chair: Moderate Assist (chair to chair w/RW)  Functional Mobility/Ambulation: Minimal Assist;Moderate Assist (w/RW)     Balance  Static Sitting Balance: Modified Independent  Dyanamic Sitting Balance: Stand by Assist  Static Standing Balance: Minimal Assist (w/RW)  Dynamic Standing Balance: Minimal Assist;Moderate Assist (w/RW)    Participation and Endurance  Participation Effort: fair    AM-PACT "6 Clicks" Daily Activity Inpatient Short Form  Inpatient AM-PACT Performed?: yes  Put On/Take Off Lower Body Clothing: Total  Assist with Bathing: A lot  Assist with Toileting: Total  Put On/Take Off Upper Body Clothing: A lot  Assist with Grooming: A little  Assist with Eating: A little  OT Daily Activity Raw Score: 12  CMS 0-100% Score: 66.57%    PMP - Progressive Mobility Protocol   PMP Activity: Step 6 - Walks in Room  Distance Walked (ft) (Step 6,7): 0 Feet    Treatment Activities:   Pt/Pt's daughter declined interpreter services and pt's daughter requested to be pt's interpreter.   Pt tolerated session well. Pt performed the following in addition to the above eval activities: Grooming, combing hair with S setup inc time and min verbal prompting for quality of care/completion of task. Functional amb with RW with Min/Mod A inc time verbal prompting. UE/LE AROM 10x1 set w/Supervision shoulder shrugs, shoulder flex, horizontal abd/adduction, elbow flex/ext, hand pumps, ankle pumps and  pt marching in place in stance(w/Min A using RW). Pt/pt's daughter educated on pt performing therex and being OOB as often and frequent as he can. Pt's family requested cold packs for pt's b/l knee pain, RN cleared pt for cold packs, pt provided with cold pack for each knee. Discussed in detail pt's d/c options however pt/pt's family wants to be d/c home with home health services.     Additional Notes:     Signed by: Hansel Starling, RN  Date/Time: 06/09/2017, 1:31 PM

## 2017-06-09 NOTE — Discharge Instr - AVS First Page (Signed)
Home Health Discharge Information     Your doctor has ordered Skilled Nursing, Physical Therapy and Occupational Therapy in-home service(s) for you while you recuperate at home, to assist you in the transition from hospital to home.    The agency that you or your representative chose to provide the service:  Name of Home Health Agency Placement: [facility] [phone]    The above services were set up by:  Darwyn Ponzo Leena Srinika Delone  (Home Health Liaison)   Phone    [phone]    Additional comments:   If you have not heard from your home health agency within 24-48 hours after discharge please call your agency to arrange a time for your first visit.  For any scheduling concerns or questions related to home health, such as time or date please contact your home health agency at the number listed above.

## 2017-06-09 NOTE — Progress Notes (Signed)
Infectious Disease            Progress Note    06/09/2017   Marco Bruce LSL:[id number] is a 84 y.o. male, history of congestive heart failure, coronary artery disease, chronic kidney disease, atrial fibrillation, status post pacemaker placement, hypertension, diabetes mellitus, anemia, polymyalgia rheumatica, gout, admitted with systemic inflammatory response syndrome, bronchitis, possible acute gouty arthropathy.    Subjective:     Marco Bruce today Symptoms: Afebrile. Walking around, significantly improved, feeling better,  improving joint pains. Denies any vomiting or diarrhea.  Other review of system is non contributory.    Objective:     Blood pressure 156/57, pulse 75, temperature 97.5 F (36.4 C), resp. rate (!) 11, height 1.575 m (5\' 2" ), weight 72.1 kg (159 lb), SpO2 97 %.    General Appearance:   No acute distress  HEENT: Pallor positive, Anicteric sclera.   Neck: Supple  Lungs:  Decreased breath sound at bases  Chest Wall: Symmetric chest wall expansion.   Heart : S1 and S2.   Abdomen: Abdomen is soft. There are no signs of ascites. Bowel sounds are normal. There is no abdominal tenderness. There is no mass. There is no splenomegaly or hepatomegaly.  Neurological:  Sleepy and lethargic. At present.   Extremities: Multiple joint pains, improving   Psychiatric:   Mood and affect is appropriate    Laboratory And Diagnostic Studies:     Recent Labs      06/09/17   0641  06/07/17   0549   WBC  8.13  10.47*   Hgb  9.8*  9.9*   Hematocrit  28.6*  29.4*   Platelets  231  205   Neutrophils   --   91.7     Recent Labs      06/09/17   0641  06/08/17   0621   Sodium  135*  134*   Potassium  4.7  4.5   Chloride  99*  96*   CO2  27  22   BUN  84.8*  78.4*   Creatinine  3.2*  4.2*   Glucose  209*  229*   Calcium  9.1  8.8     Recent Labs      06/07/17   0549   AST (SGOT)  45*   ALT  19   Alkaline Phosphatase  79   Protein, Total  5.9*   Albumin  3.3*   Bilirubin, Total  0.2       Current Med's:      Current Facility-Administered Medications   Medication Dose Route Frequency   . albuterol-ipratropium  3 mL Nebulization Q6H [facility]   . allopurinol  100 mg Oral Daily   . atorvastatin  20 mg Oral Daily   . docusate sodium  100 mg Oral BID   . fluticasone furoate-vilanterol  1 puff Inhalation QAM   . gabapentin  100 mg Oral TID   . heparin (porcine)  5,000 Units Subcutaneous Q12H [facility]   . insulin glargine  30 Units Subcutaneous QHS   . insulin lispro  1-6 Units Subcutaneous QHS   . insulin lispro  10 Units Subcutaneous Once   . insulin lispro  10 Units Subcutaneous TID AC   . insulin lispro  2-10 Units Subcutaneous TID AC   . metoprolol succinate XL  25 mg Oral Daily   . NIFEdipine  60 mg Oral QPM   . pantoprazole  40 mg Oral QAM AC   . predniSONE  5  mg Oral BID Meals       Lines/Drains:     Patient Lines/Drains/Airways Status    Active Lines, Drains and Airways     Name:   Placement date:   Placement time:   Site:   Days:    Peripheral IV 06/02/17 Left Antecubital  06/02/17        Antecubital    7                Assessment:      Condition: Guarded   Systemic inflammatory response syndrome   Bronchitis/pneumonia   Possible acute viral syndrome   Acute gouty arthropathy   Electrolyte imbalance   Congestive heart failure   Hypertension   Diabetes mellitus   Mitral regurgitation   Polymyalgia rheumatica   Acute on Chronic kidney disease   Status post pacemaker placement   Coronary artery disease   Gout     Plan:      We'll continue to monitor without antibiotics   Continue prednisone   Correction of electrolytes   Continue supportive care   Nephrology follow-up   Discussed with family   Physical therapy   Can be discharged from ID perspective with close monitoring          Dori Devino A Sinclaire Artiga, M.D.,FACP  06/09/2017  9:21 AM          *This note was generated by the Epic EMR system/ Dragon speech recognition and may contain inherent errors or omissions not intended by the user. Grammatical errors,  random word insertions, deletions, pronoun errors and incomplete sentences are occasional consequences of this technology due to software limitations. Not all errors are caught or corrected. If there are questions or concerns about the content of this note or information contained within the body of this dictation they should be addressed directly with the author for clarification

## 2017-06-09 NOTE — Progress Notes (Signed)
[facility]  Progress Note  Patient Info:   Date/Time: 06/09/2017 / 3:48 PM   Admit Date:06/02/2017  Patient Name:Marco Bruce   WCB:[id number]   PCP: Darlina Sicilian June, DO  Attending Physician:Izaih Kataoka, Leslie Dales, MD     Assessment and Plan:     84 yo with PMH of PMR on chronic prednisone; presents with bilateral knee pain, fever, acute on chronic diastolic heart failure on IV lasix with worsening renal function.    Looks like patient has underlying dementia, which is becoming more prominent during the hospital stay. At this point, he is alert, awake, oriented 2, admitted for acute on chronic diastolic congestive heart failure and acute on chronic kidney disease stage III.patient needs interpreter.  Patient's grandson present in room helped with interpretation.  Acute on chronic diastolic heart failure - Now Euvolemic and Asymptomatic  - Was initially treated with Lasix 40 IV daily for 3-4 days.  Currently diuretic held due to worsening renal function.kidney function progressively improving.  Discussed with nephrologist to find out his baseline creatinine.  Whenever he gets close to his baseline or if renal function stop improving further then patient can be discharged.  - Left ventricular ejection fraction 75% by echocardiogram in 2017, reduced to 40% by echocardiogram in May of 2018. Appreciate cardiologist input follow on recommendations.  - Moderate to severe mitral valve regurgitation.  - Mild aortic valve regurgitation.  - Pulmonary artery pressure 56 mmHg in May 2018.  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      SIRS, Possible acute viral syndrome.  Procalcitonin: 0.19, Respiratory pathogen panel PCR negative: Now  Afebrile   - was initially started empirically on azithromycin and ceftriaxone - stopped antibiotics on 06/07/17 by ID appreciate input we will follow and observe without antibiotics    Bilateral severe knee pain: Possible acute gout arthropathy L>R: L knee xray shows tricompartment  degenerative arthritis with moderate joint effusion.  Seems chronic.  Doesn't look infected. apprec ortho's input.  Empirically placed on prednisone 30 mg twice a day - will taper as tolerated, reduced to 10 BID today and will change to 5 BID tomm.     Type 2 diabetes with uncontrolled hyperglycemia: Due to prednisone. Need to monitor glucose closely due to the possibility of hypoglycemia given worsening renal function. BS in 400's today, increased Lantus to 30 HS and add 10 units TID with SSI - BS better controlled now - hopefully will continue to improve after reducing prednisone dose  AIC is 7.6    Hyponatremia:  Likely due to volume overload from CHF - improved now     PMR: normally takes prednisone 5 mg daily, which was held couple days ago resulting in worsening knee pain, now on Prednisone 5mg  BID    Leucocytosis - likely due to steroids     Anemia - likely due to CKD    Constipation - started on Colace, Dulcolax and MOM prn    Urinary retention - likely due to condom cath and constipation, resolved now     Disp - PT suggested SNF, but family has decided to take her home with home health.       DVT Prohylaxis:heparin   Central Line/Foley Catheter/PICC line status: none  Code Status: Full Code  Disposition:home  Type of Admission:Inpatient  Expected Date of Discharge: 1-2 more days.  Milestones required for discharge:see above    Hospital Problems:   Active Problems:    Stage 4 chronic kidney disease    Acute  diastolic heart failure    Hyponatremia    Type 2 diabetes mellitus with complication, with long-term current use of insulin    Acute gout of left knee    Fever and chills    Acute renal failure superimposed on stage 4 chronic kidney disease    Type 2 diabetes mellitus with hyperglycemia    Subjective:   06/09/17 Pt denies CP,SOB, palpitations. Knee pain is improving. Passing urine well after condom cath was removed. Was lying flat in the bed when I was examining him, did not have any shortness of breath  or discomfort.  No swelling in both feet  Chief Complaint:  Hip Pain; Fever; and Joint Pain    Review of Systems   Constitutional: Negative for chills and fever.   HENT: Negative for hearing loss and tinnitus.    Respiratory: Negative for cough, hemoptysis, shortness of breath and wheezing.    Cardiovascular: Positive for leg swelling. Negative for chest pain and palpitations.   Gastrointestinal: Negative for heartburn, nausea and vomiting.   Genitourinary: Negative for dysuria and urgency.   Musculoskeletal: Negative for back pain, falls, joint pain, myalgias and neck pain.        Bilateral knee pain and swelling - slowly improving   Skin: Negative for itching and rash.   Neurological: Negative for dizziness and headaches.   Psychiatric/Behavioral: Negative for depression and suicidal ideas.     Objective:     Vitals:    06/09/17 0926 06/09/17 1007 06/09/17 1259 06/09/17 1406   BP: 154/60 133/71 157/72    Pulse: 76 76 79    Resp:  16 16    Temp:  98 F (36.7 C) 98.1 F (36.7 C)    TempSrc:  Oral     SpO2:  95% 94%    Weight:    72.2 kg (159 lb 2.8 oz)   Height:         Physical Exam:   Physical Exam   Constitutional: He is oriented to person, place, and time and well-developed, well-nourished, and in no distress.   HENT:   Head: Normocephalic and atraumatic.   Neck: No JVD present. No tracheal deviation present. No thyromegaly present.   Cardiovascular: Normal rate and regular rhythm.    Murmur heard.  Pulmonary/Chest: Effort normal. No stridor. No respiratory distress. He has no wheezes. He has rales (right base).   Abdominal: Soft. Bowel sounds are normal. He exhibits no distension. There is no tenderness. There is no rebound.   Musculoskeletal: He exhibits edema.   Bilateral knee swelling, erythema and tenderness   Neurological: He is alert and oriented to person, place, and time.   Skin: Skin is warm and dry.   Nursing note and vitals reviewed.    Results of Labs/imaging   Labs and radiology reports have been  reviewed.    Hospitalist   Signed by:   Sadie Haber  06/09/2017 3:48 PM    *This note was generated by the Epic EMR system/ Dragon speech recognition and may contain inherent errors or omissions not intended by the user. Grammatical errors, random word insertions, deletions, pronoun errors and incomplete sentences are occasional consequences of this technology due to software limitations. Not all errors are caught or corrected. If there are questions or concerns about the content of this note or information contained within the body of this dictation they should be addressed directly with the author for clarification

## 2017-06-09 NOTE — Progress Notes (Signed)
[facility].  Progress Note    Assessment:   AKI 2 to diuresis and realtive hypotension- creatinine better   Hyponatremia   CHF   DM with CKD   Hypertension with CKD   Anemia   CKD III    Plan:   Hold Lasix   Salt and fluid restriction   Serial labs    D/W grandson    Marco Slough, MD  Office - [phone]  ++++++++++++++++++++++++++++++++++++++++++++++++++++++++++++++  Subjective:  No new complaints, lying flat    Medications:  Scheduled Meds:  Current Facility-Administered Medications   Medication Dose Route Frequency   . albuterol-ipratropium  3 mL Nebulization Q6H [facility]   . allopurinol  100 mg Oral Daily   . atorvastatin  20 mg Oral Daily   . docusate sodium  100 mg Oral BID   . fluticasone furoate-vilanterol  1 puff Inhalation QAM   . gabapentin  100 mg Oral TID   . heparin (porcine)  5,000 Units Subcutaneous Q12H [facility]   . insulin glargine  30 Units Subcutaneous QHS   . insulin lispro  1-6 Units Subcutaneous QHS   . insulin lispro  10 Units Subcutaneous Once   . insulin lispro  10 Units Subcutaneous TID AC   . insulin lispro  2-10 Units Subcutaneous TID AC   . metoprolol succinate XL  25 mg Oral Daily   . NIFEdipine  60 mg Oral QPM   . pantoprazole  40 mg Oral QAM AC   . predniSONE  5 mg Oral BID Meals     Continuous Infusions:    PRN Meds:acetaminophen **OR** acetaminophen, albuterol, Nursing communication: Adult Hypoglycemia Treatment Algorithm **AND** dextrose **AND** dextrose **AND** glucagon (rDNA), guaiFENesin, magnesium hydroxide, naloxone, ondansetron **OR** ondansetron, traMADol    Objective:  Vital signs in last 24 hours:  Temp:  [97 F (36.1 C)-98.5 F (36.9 C)] 98.1 F (36.7 C)  Heart Rate:  [64-80] 79  Resp Rate:  [11-18] 16  BP: (121-157)/(52-72) 157/72    Intake/Output from yesterday (07:01 - 07:00):  03/06 0701 - 03/07 0700  In: 690 [P.O.:690]  Out: 600 [Urine:600]     Physical Exam:   Gen: WD WN NAD   CV: S1 S2 N RRR   Chest: rales at bases   Ab:  ND NT soft no HSM +BS   Ext: no edema    Labs:    Recent Labs  Lab 06/09/17  0641 06/08/17  0621 06/07/17  0549  06/05/17  0524 06/04/17  0623   Glucose 209* 229* 427* More results in Results Review 133* 137*   BUN 84.8* 78.4* 63.3* More results in Results Review 29.4* 22.2   Creatinine 3.2* 4.2* 4.2* More results in Results Review 2.8* 2.1*   Calcium 9.1 8.8 8.7 More results in Results Review 9.3 9.4   Sodium 135* 134* 126* More results in Results Review 131* 125*   Potassium 4.7 4.5 4.7 More results in Results Review 5.0 4.8   Chloride 99* 96* 92* More results in Results Review 93* 91*   CO2 27 22 22  More results in Results Review 23 26   Albumin  --   --  3.3*  --  3.7 3.8   More results in Results Review = values in this interval not displayed.    Recent Labs  Lab 06/09/17  0641 06/07/17  0549 06/06/17  0611   WBC 8.13 10.47* 7.00   Hgb 9.8* 9.9* 10.4*   Hematocrit 28.6* 29.4*  30.7*   MCV 87.5 89.6 89.0   MCH 30.0 30.2 30.1   MCHC 34.3 33.7 33.9   RDW 13 13 13    MPV 9.7 10.1 10.0   Platelets 231 205 171

## 2017-06-09 NOTE — Plan of Care (Signed)
Problem: Safety  Goal: Patient will be free from injury during hospitalization  Outcome: Progressing   06/09/17 1505   Goal/Interventions addressed this shift   Patient will be free from injury during hospitalization  Assess patient's risk for falls and implement fall prevention plan of care per policy;Provide and maintain safe environment;Use appropriate transfer methods;Ensure appropriate safety devices are available at the bedside;Include patient/ family/ care giver in decisions related to safety;Hourly rounding;Assess for patients risk for elopement and implement Elopement Risk Plan per policy       Problem: Pain  Goal: Pain at adequate level as identified by patient  Outcome: Progressing   06/09/17 1505   Goal/Interventions addressed this shift   Pain at adequate level as identified by patient Assess pain on admission, during daily assessment and/or before any "as needed" intervention(s)       Problem: Renal Instability  Goal: Fluid and electrolyte balance are achieved/maintained  Outcome: Progressing   06/09/17 1505   Goal/Interventions addressed this shift   Fluid and electrolyte balance are achieved/maintained  Monitor intake and output every shift;Monitor/assess lab values and report abnormal values;Provide adequate hydration;Monitor daily weight;Assess for confusion/personality changes;Assess and reassess fluid and electrolyte status

## 2017-06-10 DIAGNOSIS — N184 Chronic kidney disease, stage 4 (severe): Secondary | ICD-10-CM

## 2017-06-10 DIAGNOSIS — N179 Acute kidney failure, unspecified: Secondary | ICD-10-CM

## 2017-06-10 LAB — CBC AND DIFFERENTIAL
Absolute NRBC: 0 10*3/uL (ref 0.00–0.00)
Basophils Absolute Automated: 0.01 10*3/uL (ref 0.00–0.08)
Basophils Automated: 0.1 %
Eosinophils Absolute Automated: 0.65 10*3/uL — ABNORMAL HIGH (ref 0.00–0.44)
Eosinophils Automated: 7.1 %
Hematocrit: 31.1 % — ABNORMAL LOW (ref 37.6–49.6)
Hgb: 10.5 g/dL — ABNORMAL LOW (ref 12.5–17.1)
Immature Granulocytes Absolute: 0.06 10*3/uL (ref 0.00–0.07)
Immature Granulocytes: 0.7 %
Lymphocytes Absolute Automated: 1.6 10*3/uL (ref 0.42–3.22)
Lymphocytes Automated: 17.6 %
MCH: 30.1 pg (ref 25.1–33.5)
MCHC: 33.8 g/dL (ref 31.5–35.8)
MCV: 89.1 fL (ref 78.0–96.0)
MPV: 9.6 fL (ref 8.9–12.5)
Monocytes Absolute Automated: 0.83 10*3/uL (ref 0.21–0.85)
Monocytes: 9.1 %
Neutrophils Absolute: 5.95 10*3/uL (ref 1.10–6.33)
Neutrophils: 65.4 %
Nucleated RBC: 0 /100 WBC (ref 0.0–0.0)
Platelets: 259 10*3/uL (ref 142–346)
RBC: 3.49 10*6/uL — ABNORMAL LOW (ref 4.20–5.90)
RDW: 13 % (ref 11–15)
WBC: 9.1 10*3/uL (ref 3.10–9.50)

## 2017-06-10 LAB — GLUCOSE WHOLE BLOOD - POCT
Whole Blood Glucose POCT: 136 mg/dL — ABNORMAL HIGH (ref 70–100)
Whole Blood Glucose POCT: 111 mg/dL — ABNORMAL HIGH (ref 70–100)
Whole Blood Glucose POCT: 150 mg/dL — ABNORMAL HIGH (ref 70–100)

## 2017-06-10 LAB — BASIC METABOLIC PANEL
Anion Gap: 9 (ref 5.0–15.0)
BUN: 65 mg/dL — ABNORMAL HIGH (ref 9.0–28.0)
CO2: 27 mEq/L (ref 22–29)
Calcium: 9.7 mg/dL (ref 7.9–10.2)
Chloride: 105 mEq/L (ref 100–111)
Creatinine: 2.2 mg/dL — ABNORMAL HIGH (ref 0.7–1.3)
Glucose: 121 mg/dL — ABNORMAL HIGH (ref 70–100)
Potassium: 4.6 mEq/L (ref 3.5–5.1)
Sodium: 141 mEq/L (ref 136–145)

## 2017-06-10 LAB — GFR: EGFR: 28.3

## 2017-06-10 MED ORDER — GUAIFENESIN 100 MG/5ML PO SOLN
200.00 mg | ORAL | Status: DC | PRN
Start: 2017-06-10 — End: 2017-08-08

## 2017-06-10 MED ORDER — CETIRIZINE HCL 10 MG PO TABS
10.00 mg | ORAL_TABLET | Freq: Every day | ORAL | Status: DC | PRN
Start: 2017-06-10 — End: 2018-04-11

## 2017-06-10 MED ORDER — MAGNESIUM HYDROXIDE 400 MG/5ML PO SUSP
30.0000 mL | Freq: Every day | ORAL | 0 refills | Status: DC | PRN
Start: 2017-06-10 — End: 2017-12-01

## 2017-06-10 MED ORDER — ACETAMINOPHEN 325 MG PO TABS
650.00 mg | ORAL_TABLET | ORAL | Status: DC | PRN
Start: 2017-06-10 — End: 2018-01-10

## 2017-06-10 MED ORDER — FUROSEMIDE 20 MG PO TABS
40.00 mg | ORAL_TABLET | Freq: Every day | ORAL | 3 refills | Status: DC
Start: 2017-06-13 — End: 2017-11-03

## 2017-06-10 NOTE — Progress Notes (Signed)
Nutritional Support Services  Brief Follow-up Note    Glennis Montenegro 84 y.o. male   MRN: [id number]    Summary of Nutrition Plan of Care & Recommendations:  1) Continue consistent carbohydrate + cardiac diet  2) Continue Glucerna TID  3) Continue current insulin regimen as glucose levels have been <180 mg/dl  -----------------------------------------------------------------------------------------------------------------    Follow-up from RD note on 3/4. Spoke with pt's grandson who reports pt's po intake has improved significantly. Per nursing documentation, pt has been consuming between 25-75% of meals and 100% of Glucerna. Pt reports no nausea and last bm was two days ago, does not feel constipated. Blood glucose levels have been <180 mg/dl for the past 24 hours, continue current insulin regimen. Nutrition will continue following pt.     Anselmo Rod, [place]  Clinical Dietitian  [phone]

## 2017-06-10 NOTE — OT Progress Note (Signed)
[facility]  [place], [place]  [phone]    Occupational Therapy Treatment Note       Patient:  Marco Bruce MRN#:  [id number]  MEDICAL SURGICAL [place] M247/M247-A    Time of treatment: Start Time: 1152 Stop Time: 1240   Time Calculation (min): 48 min         Precautions and Contraindications:    Precautions  Aspiration Precautions: see SLP recommendations  Other Precautions: Fall Risk, Limited English Speaking, Pt's family provides translation    Assessment: Pt presents decreased sitting/standing balance, decreased BUE/BLE strength, decreased gross motor coordination, decreased safety awareness, decreased activity tolerance/endurance and decreased functional mobility which hinders her performance in ADLs. Continued skilled OT is medically necessary to address the above deficits and to promote a safe return home for pt.     Assessment  Progress: Progressing toward goals      Plan: Continue with Occupational therapy services in acute care to address decreased functional mobility that hinders ADLs/IADLs. Focus next therapy session on ADL re-training, Therapeutic exercise, and Therapeutic activities.      OT Plan  Risks/Benefits/POC Discussed with Pt/Family: With patient/family  Treatment Interventions: ADL retraining;Functional transfer training;UE strengthening/ROM;Endurance training;Patient/Family training;Equipment eval/education;Neuro muscular reeducation  Discharge Recommendation: Home with supervision  DME Recommended for Discharge: Front wheel walker;Grab bars;Shower chair;Transport chair  OT Frequency Recommended: 3-4x/wk  OT - Next Visit Recommendation: 06/13/17    Based on today's session patient's discharge recommendation is the following: Discharge Recommendation: Home with supervision.     If Discharge Recommendation: Home with supervision is not available, then the patient will need assistance with mobility and assistance with  ADL's.  DME Recommended for Discharge: Front wheel walker;Grab bars;Shower chair;Transport chair      Subjective:      Patient's medical condition is appropriate for Occupational Therapy intervention at this time.  Patient is agreeable to participation in the therapy session. Family and/or guardian are agreeable to patient's participation in the therapy session. Nursing clears patient for therapy.  Pain Assessment  Pain Assessment: Numeric Scale (0-10)    Objective:  Observation of Patient/Vital Signs:  Patient found in bed with telemetry in place. Pt's son was sitting next to bedside.    Cognition/Neuro Status  Arousal/Alertness: Appropriate responses to stimuli  Attention Span: Appears intact  Orientation Level: Oriented X4  Memory: Appears intact  Following Commands: Follows one step commands with repetition  Safety Awareness: minimal verbal instruction  Insights: Educated in safety awareness  Behavior: cooperative;calm;attentive    Functional Mobility  Supine to Sit Transfers: Stand by Assist;additional time;grab bars  Sit to Supine Transfers: Stand by Assist (educated in proper hand/FWW positioning to reduce falls)  Sit to Stand Transfers: Supervision (educated in proper hand/FWW postioning to reduce falls)  Toilet Transfers: Supervision;grab bars (using FWW)    Self-care and Home Management  Grooming: Contact Guard Assist;standing at sink;steadying;verbal prompting;supervision/safety;standing with assistive device;wash/dry hands;wash/dry face;brushing hair (oral care-no teeth or dentures)  LB Dressing: edge of bed;Stand by Assist;Requires assistive device for steadying;Verbal prompting;Supervision/safety;Increased time to complete (education pt in energy conservation and safety techniques )  Toileting: Supervision;supervison/safety  Functional Transfers: Stand by Assist;steadying;supervision/safety;verbal prompting;toilet transfer (using FWW to/from EOB to toilet then to sink )    Therapeutic  Exercises  Shoulder AROM: Flexion;Abduction (standing with FWW)  Shoulder Strengthening: T band flexion (in standing position)    Neuro Re-Ed  Balance Training: Standing reaching activities (supported with FWW)  Treatment Activities: Completed pt education on proper hand/leg/body positioning for functional mobility including bed mobility, supine in bed to/from EOB, sit to stand transfer supported by FWW, transfer to/from EOB to standard toilet seat using FWW, grooming task at sink level and LB body dressing sitting on EOB. Provided pt education regarding safety awareness, energy conservation and use of DME during ADLs in order to reduced fall risk.  Instructed pt in standing with FWW to participate in therapeutic activities which facilitated functional reaching and weight-shifting to increase independence in ADLs/IADLs. OT also instructed pt in reaching for items out of place, above head, below waist and across midline to facilitate dynamic standing, activity tolerance and strength for ADLs/IADLs.  Instructed pt in standing to complete BUE strengthening using red T-band 10 reps X 3 sets focusing biceps, triceps, chest and abdominal muscles to increase functional mobility and endurance for ADLs. OT downgraded above activity to include pt positioned sitting on EOB secondary to signs of decreased endurance.       Educated the patient to role of occupational therapy, plan of care, goals of therapy and safety with mobility and ADLs, energy conservation techniques, home safety.    Patient left without needs and call bell within reach. Bed Alarm set.  RN notified of session outcome.    Patient left without needs in bed with telemetry in place. Pt's son was sitting next to bedside. Call bell, phone and bed side table within reach. Bed Alarm set.  RN notified of session outcome.      Johnanna Schneiders, MS, OTR/L

## 2017-06-10 NOTE — Progress Notes (Signed)
PTS ambulance requested by patient and dtr Autumn Gunn [place]# [phone], PTS canceled as per Patrcia Dolly and patient's request. Patient to d/c via car transport to home today.

## 2017-06-10 NOTE — Discharge Summary (Signed)
[facility] HOSPITALIST   [place] Summary   Patient Info:   Date/Time: 06/10/2017 / 4:54 PM   Admit Date:06/02/2017  Patient Name:Marco Bruce   SNG:[id number]   PCP: Darlina Sicilian June, DO  Attending Physician:Alric Geise, Raeanne Barry, MD     Hospital Course:   Please see H&P for complete details of HPI and ROS. The patient was admitted to [facility] and has been taken care as mentioned below.      84 yo with PMH of PMR on chronic prednisone; presents with bilateral knee pain, fever, acute on chronic diastolic heart failure on IV lasix with worsening renal function.     Acute on chronic diastolic heart failure - Now Euvolemic and Asymptomatic  - Was initially treated with Lasix 40 IV daily for 3-4 days. Currently diuretic held due to worsening renal function. To resume Lasix on 06/13/17,  stopped Chlorthalidone  - Left ventricular ejection fraction 75% by echocardiogram in 2017, reduced to 40% by echocardiogram in May of 2018.  - Moderate to severe mitral valve regurgitation.  - Mild aortic valve regurgitation.  - Pulmonary artery pressure 56 mmHg in May 2018.  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    Acute renal failure on chronic kidney disease III: Baseline ~1.7, 4.2 today, likely due to aggressive diuresis and hypotension.  - Improved, close to baseline after holding Lasix  - F/u with Nephrology in 1-2 weeks     SIRS, Possible acute viral syndrome. Procalcitonin: 0.19, Respiratory pathogen panel PCR negative: Fevers improved  - was initially started empirically on azithromycin and ceftriaxone - stopped antibiotics on 06/07/17 by ID, stable now     Bilateral severe knee pain:   - Possible acute gout arthropathy L>R: L knee xray shows tricompartment degenerative arthritis with moderate joint effusion.   - improved with steroids     Type 2 diabetes with uncontrolled hyperglycemia due Due to prednisone.- BS better controlled now - hopefully will continue to improve with reduced prednisone dose  - Cont Lasix  and SSI at home , resume Linagliptin   AIC is 7.6    Hyponatremia: Likely due to volume overload from CHF - improved now     PMR: normally takes prednisone 5 mg daily, which was held couple days ago resulting in worsening knee pain, improved after resuming Prednisone     Leucocytosis - likely due to steroids     Anemia - likely due to CKD    Constipation - started on Colace, Dulcolax and MOM prn    Urinary retention - likely due to condom cath and constipation, resolved now     Disp - PT suggested SNF, but family has decided to take her home with home health.       Disposition:home  Condition at Discharge and Prognosis: stable  Admission Date:06/02/2017  Discharge Date: 06/10/17  Type of Admission:Inpatient   Code Status: Full Code  Subjective at the time of discharge:   Pt denies Any chest pain, shortness of breath, palpitations, dizziness, lightheadedness.  His knee pain has resolved now  Chief Complaint:  Hip Pain; Fever; and Joint Pain    Objective:     Vitals:    06/10/17 0615 06/10/17 0714 06/10/17 0907 06/10/17 1414   BP: 155/74  158/73 161/70   Pulse: 72 65 69 70   Resp: 16 14 16 16    Temp: 98.1 F (36.7 C)  98.1 F (36.7 C) 97.9 F (36.6 C)   TempSrc: Oral  Oral Oral  SpO2: 96% 95% 95% 97%   Weight: 72.9 kg (160 lb 11.5 oz)      Height:         Physical Exam:   Heart - S1, S2 heard.  No murmur  Lungs - bilateral air entry present, no wheeze or rales  Abdomen - soft, bowel sounds normal, nondistended, no tenderness  Central nervous system - no focal deficits  Musculoskeletal - no edema     Clinical Presentation:   History of Presenting Illness: Please refer to HPI in the Detailed H&P  Discharge Medications:   Discharge Medications:      Discharge Medication List      Taking    acetaminophen 325 MG tablet  Dose:  650 mg  Commonly known as:  TYLENOL  Take 2 tablets (650 mg total) by mouth every 4 (four) hours as needed for Fever (Temperature greater than 38 C/100.4 F).     albuterol 108 (90 Base)  MCG/ACT inhaler  Dose:  2 puff  Commonly known as:  PROAIR HFA  For:  Disease Involving Spasms of the Bronchus  Inhale 2 puffs into the lungs every 6 (six) hours as needed for Wheezing.     allopurinol 100 MG tablet  Dose:  100 mg  Commonly known as:  ZYLOPRIM  Take 1 tablet (100 mg total) by mouth daily.     atorvastatin 20 MG tablet  Dose:  20 mg  Commonly known as:  LIPITOR  Take 1 tablet (20 mg total) by mouth daily.     cetirizine 10 MG tablet  Dose:  10 mg  What changed:   when to take this   reasons to take this  Commonly known as:  ZyrTEC  Take 1 tablet (10 mg total) by mouth daily as needed for Rhinitis.     cyclobenzaprine 10 MG tablet  Dose:  10 mg  Commonly known as:  FLEXERIL  Take 1 tablet (10 mg total) by mouth every 8 (eight) hours as needed for Muscle spasms.     docusate sodium 100 MG capsule  Dose:  100 mg  Commonly known as:  COLACE  Take 100 mg by mouth 2 (two) times daily.     furosemide 20 MG tablet  Dose:  40 mg  What changed:  These instructions start on 06/13/2017. If you are unsure what to do until then, ask your doctor or other care provider.  Commonly known as:  LASIX  For:  Edema, High Blood Pressure Disorder  Start taking on:  06/13/2017  Take 2 tablets (40 mg total) by mouth daily.     gabapentin 100 MG capsule  Commonly known as:  NEURONTIN  TAKE ONE CAPSULE BY MOUTH EVERY 8 HOURS     guaiFENesin 100 MG/5ML Soln  Dose:  200 mg  Commonly known as:  ROBITUSSIN  Take 10 mLs (200 mg total) by mouth every 4 (four) hours as needed (cough).     hydrALAZINE 50 MG tablet  Dose:  50 mg  Commonly known as:  APRESOLINE  Take 1 tablet (50 mg total) by mouth every morning.     hypromellose 2.5 % ophthalmic solution  Dose:  1 drop  Commonly known as:  GONIOVISC, GONAK  Place 1 drop into both eyes as needed (prn dry eyes).     insulin glargine 100 UNIT/ML injection pen  Dose:  20 Units  For:  Type 2 Diabetes  Inject 20 Units into the skin nightly.     insulin lispro  100 UNIT/ML injection  Dose:  1-3  Units  Commonly known as:  HumaLOG  Inject 1-3 Units into the skin nightly.Based on scale     Insulin Pen Needle 31G X 5 MM Misc  INJECT INSULIN DAILY AS DIRECTED     Linagliptin 5 MG Tabs  Dose:  5 mg  Take 1 tablet (5 mg total) by mouth daily.     magnesium hydroxide 400 MG/5ML suspension  Dose:  30 mL  Commonly known as:  MILK OF MAGNESIA  Take 30 mLs by mouth daily as needed for Constipation.     metoprolol succinate XL 25 MG 24 hr tablet  Dose:  25 mg  Commonly known as:  TOPROL-XL  Take 25 mg by mouth daily.     NIFEdipine 60 MG 24 hr tablet  What changed:   when to take this   additional instructions  Commonly known as:  PROCARDIA XL  For:  High Blood Pressure Disorder  TAKE 2 TABLETS BY MOUTH Every morning.     pantoprazole 40 MG tablet  Commonly known as:  PROTONIX  TAKE ONE TABLET BY MOUTH EVERY DAY     predniSONE 5 MG tablet  Dose:  5 mg  Commonly known as:  DELTASONE  Take 1 tablet (5 mg total) by mouth daily.     RISAQUAD PO  Dose:  1 capsule  Take 1 capsule by mouth daily.     valsartan 80 MG tablet  Dose:  80 mg  Commonly known as:  DIOVAN  Take 1 tablet (80 mg total) by mouth daily.        STOP taking these medications    chlorthalidone 50 MG tablet  Commonly known as:  HYGROTEN     glucose blood test strip  Commonly known as:  TRUE METRIX BLOOD GLUCOSE TEST     sodium bicarbonate 650 MG tablet          Follow up recommendations:   Follow up:   Follow-up Information     [facility] Follow up in 2 day(s).    Contact information:  [place] [address]  [phone]           Queen Slough, MD Follow up in 1 week(s).    Specialties:  Nephrology, Internal Medicine  Contact information:  [address]  [place] [address]  [phone]             Emeline General, MD. Schedule an appointment as soon as possible for a visit.    Specialty:  Cardiology  Contact information:  [address]  [place] [address]  [phone]             Darlina Sicilian June, DO. Schedule  an appointment as soon as possible for a visit.    Specialty:  Internal Medicine  Contact information:  [place] [address]  [phone]                  Results of Labs/imaging:   Labs have been reviewed:   Coagulation Profile:       CBC review:   Recent Labs  Lab 06/10/17  0645 06/09/17  0641 06/07/17  0549 06/06/17  0611 06/05/17  0524 06/04/17  0623   WBC 9.10 8.13 10.47* 7.00 8.73 8.71   Hgb 10.5* 9.8* 9.9* 10.4* 11.7* 11.1*   Hematocrit 31.1* 28.6* 29.4* 30.7* 34.1* 31.6*   Platelets 259 231 205  171 186 170   MCV 89.1 87.5 89.6 89.0 88.3 87.8   RDW 13 13 13 13 13 13    Neutrophils 65.4  --  91.7 83.2 65.4 53.8   Lymphocytes Automated 17.6  --  4.7 8.7 16.3 17.6   Eosinophils Automated 7.1  --  0.0 0.0 3.9 17.1   Immature Granulocyte 0.7  --  0.5 0.4 0.5 0.2   Neutrophils Absolute 5.95  --  9.61* 5.82 5.72 4.69   Absolute Immature Granulocyte 0.06  --  0.05 0.03 0.04 0.02     Chem Review:  Recent Labs  Lab 06/10/17  0645 06/09/17  0641 06/08/17  0621 06/07/17  0549 06/06/17  0611 06/05/17  0524 06/04/17  0623   Sodium 141 135* 134* 126* 133* 131* 125*   Potassium 4.6 4.7 4.5 4.7 4.7 5.0 4.8   Chloride 105 99* 96* 92* 93* 93* 91*   CO2 27 27 22 22 24 23 26    BUN 65.0* 84.8* 78.4* 63.3* 47.4* 29.4* 22.2   Creatinine 2.2* 3.2* 4.2* 4.2* 4.1* 2.8* 2.1*   Glucose 121* 209* 229* 427* 356* 133* 137*   Calcium 9.7 9.1 8.8 8.7 8.9 9.3 9.4   Bilirubin, Total  --   --   --  0.2  --  0.5 0.5   AST (SGOT)  --   --   --  45*  --  25 21   ALT  --   --   --  19  --  10 7   Alkaline Phosphatase  --   --   --  79  --  48 54     Results     Procedure Component Value Units Date/Time    Glucose Whole Blood - POCT [[id number]]  (Abnormal) Collected:  06/10/17 1121     Updated:  06/10/17 1126     POCT - Glucose Whole blood 150 (H) mg/dL     Glucose Whole Blood - POCT [[id number]]  (Abnormal) Collected:  06/10/17 0803     Updated:  06/10/17 0814     POCT - Glucose Whole blood 111 (H) mg/dL     Basic Metabolic Panel  [[id number]]  (Abnormal) Collected:  06/10/17 0645    Specimen:  Blood Updated:  06/10/17 0756     Glucose 121 (H) mg/dL      BUN 65.0 (H) mg/dL      Creatinine 2.2 (H) mg/dL      Calcium 9.7 mg/dL      Sodium 141 mEq/L      Potassium 4.6 mEq/L      Chloride 105 mEq/L      CO2 27 mEq/L      Anion Gap 9.0    GFR [[id number]] Collected:  06/10/17 0645     Updated:  06/10/17 0756     EGFR 28.3    CBC and differential [[id number]]  (Abnormal) Collected:  06/10/17 0645    Specimen:  Blood from Blood Updated:  06/10/17 0724     WBC 9.10 x10 3/uL      Hgb 10.5 (L) g/dL      Hematocrit 31.1 (L) %      Platelets 259 x10 3/uL      RBC 3.49 (L) x10 6/uL      MCV 89.1 fL      MCH 30.1 pg      MCHC 33.8 g/dL      RDW 13 %      MPV 9.6 fL  Nucleated RBC 0.0 /100 WBC      Absolute NRBC 0.00 x10 3/uL      Neutrophils 65.4 %      Lymphocytes Automated 17.6 %      Monocytes 9.1 %      Eosinophils Automated 7.1 %      Basophils Automated 0.1 %      Immature Granulocyte 0.7 %      Neutrophils Absolute 5.95 x10 3/uL      Abs Lymph Automated 1.60 x10 3/uL      Abs Mono Automated 0.83 x10 3/uL      Abs Eos Automated 0.65 (H) x10 3/uL      Absolute Baso Automated 0.01 x10 3/uL      Absolute Immature Granulocyte 0.06 x10 3/uL     Glucose Whole Blood - POCT [[id number]]  (Abnormal) Collected:  06/10/17 0305     Updated:  06/10/17 0310     POCT - Glucose Whole blood 136 (H) mg/dL     Glucose Whole Blood - POCT [[id number]] Collected:  06/09/17 2223     Updated:  06/09/17 2235     POCT - Glucose Whole blood 94 mg/dL     CULTURE BLOOD AEROBIC AND ANAEROBIC [[id number]] Collected:  06/04/17 0851    Specimen:  Blood from Blood, Venipuncture Updated:  06/09/17 1721    Narrative:       The order will result in two separate 8-9ml bottles  Please do NOT order repeat blood cultures if one has been  drawn within the last 48 hours.  AVOID BLOOD CULTURE DRAWS FROM CENTRAL LINE IF POSSIBLE  Indications:->Fever of greater than 101.5  ORDER#: [id number]                                     ORDERED BY: NANDI, RADHIKA  SOURCE: Blood, Venipuncture L H                      COLLECTED:  06/04/17 08:51  ANTIBIOTICS AT COLL.:                                RECEIVED :  06/04/17 14:44  Culture Blood Aerobic and Anaerobic        FINAL       06/09/17 17:21  06/09/17   No growth after 5 days of incubation.      CULTURE BLOOD AEROBIC AND ANAEROBIC [[id number]] Collected:  06/04/17 0851    Specimen:  Blood from Blood, Venipuncture Updated:  06/09/17 1721    Narrative:       The order will result in two separate 8-38ml bottles  Please do NOT order repeat blood cultures if one has been  drawn within the last 48 hours.  AVOID BLOOD CULTURE DRAWS FROM CENTRAL LINE IF POSSIBLE  Indications:->Fever of greater than 101.5  ORDER#: [id number]                                    ORDERED BY: NANDI, RADHIKA  SOURCE: Blood, Venipuncture R H                      COLLECTED:  06/04/17 08:51  ANTIBIOTICS AT COLL.:  RECEIVED :  06/04/17 14:44  Culture Blood Aerobic and Anaerobic        FINAL       06/09/17 17:21  06/09/17   No growth after 5 days of incubation.      Glucose Whole Blood - POCT [[id number]] Collected:  06/09/17 1624     Updated:  06/09/17 1656     POCT - Glucose Whole blood 88 mg/dL         Radiology reports have been reviewed:  Radiology Results (24 Hour)     ** No results found for the last 24 hours. **        Xr Chest 2 Views    Result Date: 06/05/2017  HISTORY: Follow-up suspected pneumonia. COMPARISON: Examination 06/04/2017. TECHNIQUE: AP sitting and lateral projections of the chest. FINDINGS: Cardiac size is unchanged post pacer device deployment. Patchy airspace opacities noted in the left base. The right lung is clear. There is no pneumothorax or large pleural effusion.      New patchy airspace opacities in the left basal indicative of atelectasis or pneumonia in the correct clinical setting. Ferd Hibbs, MD 06/05/2017 10:32 AM    Xr Chest 2  Views    Result Date: 06/02/2017  Clinical History: Generalized weakness. Findings: AP and lateral views of the chest. Compared with prior studies including 05/28/2017. Calcification and tortuosity of the thoracic aorta. Dual-lead pacemaker wires again overlie the heart. Stable prominent heart, magnified by AP technique. Pulmonary vascularity is normal. Stable elevation of the right hemidiaphragm. No focal consolidation or pleural effusion. Evaluation is somewhat limited due to patient body habitus, mild lung hypoinflation, and AP technique.      Stable prominent heart. Darnelle Bos, MD 06/02/2017 1:01 PM    X-ray Chest Pa And Lateral    Result Date: 05/28/2017  Chest 2 views CLINICAL INFORMATION: Cough, wheezing. FINDINGS: Comparison is made to previous study dated 03/19/2017. A dual-lead pacemaker is present. There is elevation of the right diaphragm, unchanged from prior exam. The lungs are clear. There are no effusions. The cardiomediastinal silhouette is stable.      Stable elevation of the right hemidiaphragm. No acute disease. Elwanda Brooklyn, MD 05/28/2017 12:01 PM    Xr Knee 3 View Left    Result Date: 06/03/2017  CLINICAL INFORMATION: Knee locking. No direct trauma. TECHNIQUE AND FINDINGS: Left knee: AP, crosstable lateral, and both obliques. Comparison with 05/26/2016. Bone mineralization within normal limits. 3 compartment degenerative arthrosis most marked medially where there is the greatest loss of joint space that is mild to moderate. Moderate 3 compartment osteophytes. No erosions. Moderate fullness in the suprapatellar region consistent with moderate joint effusion. No fat fluid level identified. Considerable vascular calcification.     1. No acute findings or evidence of erosion. 2. Tri compartment degenerative arthritis greatest medially. Moderate joint effusion. Lupita Leash, MD 06/03/2017 3:45 PM    Hip Right Ap And Lateral With Pelvis    Result Date: 06/02/2017  Clinical History: Right hip  pain. Findings: AP view of the pelvis and frogleg lateral view of the right hip. Prominent iliac and femoral artery calcifications. Degenerative changes in the partially visualized lower lumbosacral spine. 2.9 cm ovoid density over the proximal soft tissues of the right thigh is suspected to represent a lipoma that measured approximately 2.5 cm on coronal reformatted images on the previous CT. No fracture, dislocation, or significant right hip osteoarthritis is identified.      No evidence of fracture. Harrell Gave  Jeralyn Ruths, MD 06/02/2017 1:00 PM    Xr Chest Ap Portable    Result Date: 06/04/2017  HISTORY: Short of breath. COMPARISON: Chest exam 06/02/2017. FINDINGS: Single portable view of the chest was obtained. Cardiac pacer device and mild cardiac enlargement are unchanged. Pulmonary vascularity is normal, and the lungs are clear. There is no pneumothorax or large pleural effusion. No acute bony findings are identified.      Clear lungs. Unchanged mild cardiac enlargement. Ferd Hibbs, MD 06/04/2017 1:28 PM    Pathology:   Specimens     None        Pending Lab Results:   Labs/Images to be followed at your PCP office: Unresulted Labs     None        Hospitalist:   Signed by: Sherre Scarlet  06/10/2017 4:54 PM  Time spent for discharge: 45 minutes      *This note was generated by the Epic EMR system/ Dragon speech recognition and may contain inherent errors or omissions not intended by the user. Grammatical errors, random word insertions, deletions, pronoun errors and incomplete sentences are occasional consequences of this technology due to software limitations. Not all errors are caught or corrected. If there are questions or concerns about the content of this note or information contained within the body of this dictation they should be addressed directly with the author for clarification

## 2017-06-10 NOTE — Plan of Care (Signed)
Problem: Renal Instability  Goal: Free from infection  Outcome: Progressing   06/10/17 1448   Goal/Interventions addressed this shift   Free from infection Monitor/assess for signs and symptoms of infection   The patient remains afebrile. Routine handwashing.

## 2017-06-10 NOTE — Plan of Care (Signed)
Problem: Safety  Goal: Patient will be free from injury during hospitalization  Outcome: Progressing   06/10/17 1451   Goal/Interventions addressed this shift   Patient will be free from injury during hospitalization  Assess patient's risk for falls and implement fall prevention plan of care per policy;Provide and maintain safe environment;Include patient/ family/ care giver in decisions related to safety;Hourly rounding   Family at bedside to assist with communication. Bed down and rails up. Pt educated to use call bell. Call bell within reach.

## 2017-06-10 NOTE — Progress Notes (Signed)
[facility].  Progress Note    Assessment:     AKI due to fluid status and use of diuretics; serum creatinine improving to 2.2 mg/dL; good metabolic control    Hyponatremia due to hypervolemia and use of chlorthalidone    CHF   DM with CKD   Hypertension with CKD   Anemia   CKD III    Plan:    -continue to hold diuretics   -fluid restriction to 1L per day   -labs   -supportive care    Linde Gillis, MD  Office - [phone]  ++++++++++++++++++++++++++++++++++++++++++++++++++++++++++++++  Subjective:  No new complaints    Medications:  Scheduled Meds:  Current Facility-Administered Medications   Medication Dose Route Frequency   . albuterol-ipratropium  3 mL Nebulization Q6H [place]   . allopurinol  100 mg Oral Daily   . atorvastatin  20 mg Oral Daily   . docusate sodium  100 mg Oral BID   . fluticasone furoate-vilanterol  1 puff Inhalation QAM   . gabapentin  100 mg Oral TID   . heparin (porcine)  5,000 Units Subcutaneous Q12H [facility]   . insulin glargine  30 Units Subcutaneous QHS   . insulin lispro  1-6 Units Subcutaneous QHS   . insulin lispro  10 Units Subcutaneous Once   . insulin lispro  10 Units Subcutaneous TID AC   . insulin lispro  2-10 Units Subcutaneous TID AC   . metoprolol succinate XL  25 mg Oral Daily   . NIFEdipine  60 mg Oral QPM   . pantoprazole  40 mg Oral QAM AC   . predniSONE  5 mg Oral BID Meals     Continuous Infusions:  PRN Meds:acetaminophen **OR** acetaminophen, albuterol, Nursing communication: Adult Hypoglycemia Treatment Algorithm **AND** dextrose **AND** dextrose **AND** glucagon (rDNA), guaiFENesin, magnesium hydroxide, naloxone, ondansetron **OR** ondansetron, traMADol    Objective:  Vital signs in last 24 hours:  Temp:  [97.2 F (36.2 C)-98.2 F (36.8 C)] 98.1 F (36.7 C)  Heart Rate:  [63-79] 69  Resp Rate:  [14-18] 16  BP: (143-158)/(64-76) 158/73  Intake/Output last 24 hours:    Intake/Output Summary (Last 24 hours) at 06/10/17  1246  Last data filed at 06/10/17 0500   Gross per 24 hour   Intake              250 ml   Output              900 ml   Net             -650 ml     Intake/Output this shift:  No intake/output data recorded.    Physical Exam:   Gen: WD WN NAD   CV: S1 S2 N RRR   Chest: CTAB   Ab: ND NT soft no HSM +BS   Ext: No C/E    Labs:    Recent Labs  Lab 06/10/17  0645 06/09/17  0641 06/08/17  0621 06/07/17  0549  06/05/17  0524 06/04/17  0623   Glucose 121* 209* 229* 427* More results in Results Review 133* 137*   BUN 65.0* 84.8* 78.4* 63.3* More results in Results Review 29.4* 22.2   Creatinine 2.2* 3.2* 4.2* 4.2* More results in Results Review 2.8* 2.1*   Calcium 9.7 9.1 8.8 8.7 More results in Results Review 9.3 9.4   Sodium 141 135* 134* 126* More results in Results Review 131* 125*   Potassium 4.6 4.7 4.5 4.7  More results in Results Review 5.0 4.8   Chloride 105 99* 96* 92* More results in Results Review 93* 91*   CO2 27 27 22 22  More results in Results Review 23 26   Albumin  --   --   --  3.3*  --  3.7 3.8   More results in Results Review = values in this interval not displayed.    Recent Labs  Lab 06/10/17  0645 06/09/17  0641 06/07/17  0549   WBC 9.10 8.13 10.47*   Hgb 10.5* 9.8* 9.9*   Hematocrit 31.1* 28.6* 29.4*   MCV 89.1 87.5 89.6   MCH 30.1 30.0 30.2   MCHC 33.8 34.3 33.7   RDW 13 13 13    MPV 9.6 9.7 10.1   Platelets 259 231 205

## 2017-06-10 NOTE — Progress Notes (Signed)
06/10/17 2201   Discharge Disposition   Patient preference/choice provided? Yes   Physical Discharge Disposition Home, Home Health

## 2017-06-10 NOTE — Plan of Care (Signed)
SL  Removed. Discharge instructions reviewed with grandson Georgina Snell and copy provided. Questions encouraged. Grandson with no questions at this time. Verbalized understanding. Pt discharged from unit via wheelchair accompanied by RN and Radio producer at [phone]. Pt transferred to grandson's car for transport home. Pt with all belongings.

## 2017-06-10 NOTE — Progress Notes (Signed)
Infectious Disease            Progress Note    06/10/2017   Marco Bruce SCB:[id number],YGE:[id number] is a 84 y.o. male, history of congestive heart failure, coronary artery disease, chronic kidney disease, atrial fibrillation, status post pacemaker placement, hypertension, diabetes mellitus, anemia, polymyalgia rheumatica, gout, admitted with systemic inflammatory response syndrome, bronchitis, possible acute gouty arthropathy.    Subjective:     Marco Bruce today Symptoms: Afebrile. Feeling better, wants to go home, improving joint pains. Denies any vomiting or diarrhea.  Other review of system is non contributory.    Objective:     Blood pressure 158/73, pulse 69, temperature 98.1 F (36.7 C), temperature source Oral, resp. rate 16, height 1.575 m (5\' 2" ), weight 72.9 kg (160 lb 11.5 oz), SpO2 95 %.    General Appearance:   No acute distress; looks comfortable  HEENT: Pallor positive, Anicteric sclera.   Neck: Supple  Lungs:  Decreased breath sound at bases  Chest Wall: Symmetric chest wall expansion.   Heart : S1 and S2.   Abdomen: Abdomen is soft. There are no signs of ascites. Bowel sounds are normal. There is no abdominal tenderness. There is no mass. There is no splenomegaly or hepatomegaly.  Neurological:   Awake and responsive, moves all extremities   Extremities: Multiple joint pains, improved  Psychiatric:   Mood and affect is appropriate    Laboratory And Diagnostic Studies:     Recent Labs      06/10/17   0645  06/09/17   0641   WBC  9.10  8.13   Hgb  10.5*  9.8*   Hematocrit  31.1*  28.6*   Platelets  259  231   Neutrophils  65.4   --      Recent Labs      06/10/17   0645  06/09/17   0641   Sodium  141  135*   Potassium  4.6  4.7   Chloride  105  99*   CO2  27  27   BUN  65.0*  84.8*   Creatinine  2.2*  3.2*   Glucose  121*  209*   Calcium  9.7  9.1       Current Med's:     Current Facility-Administered Medications   Medication Dose Route Frequency   . albuterol-ipratropium  3 mL Nebulization  Q6H [facility]   . allopurinol  100 mg Oral Daily   . atorvastatin  20 mg Oral Daily   . docusate sodium  100 mg Oral BID   . fluticasone furoate-vilanterol  1 puff Inhalation QAM   . gabapentin  100 mg Oral TID   . heparin (porcine)  5,000 Units Subcutaneous Q12H [facility]   . insulin glargine  30 Units Subcutaneous QHS   . insulin lispro  1-6 Units Subcutaneous QHS   . insulin lispro  10 Units Subcutaneous Once   . insulin lispro  10 Units Subcutaneous TID AC   . insulin lispro  2-10 Units Subcutaneous TID AC   . metoprolol succinate XL  25 mg Oral Daily   . NIFEdipine  60 mg Oral QPM   . pantoprazole  40 mg Oral QAM AC   . predniSONE  5 mg Oral BID Meals       Lines/Drains:     Patient Lines/Drains/Airways Status    Active Lines, Drains and Airways     Name:   Placement date:   Placement time:   Site:  Days:    Peripheral IV 06/02/17 Left Antecubital  06/02/17        Antecubital    8                Assessment:      Condition: Guarded   Systemic inflammatory response syndrome   Bronchitis/pneumonia   Possible acute viral syndrome   Acute gouty arthropathy   Electrolyte imbalance   Congestive heart failure   Hypertension   Diabetes mellitus   Mitral regurgitation   Polymyalgia rheumatica   Acute on Chronic kidney disease   Status post pacemaker placement   Coronary artery disease   Gout     Plan:      We'll continue to monitor without antibiotics   Continue prednisone   Correction of electrolytes   Continue supportive care   Nephrology follow-up   Physical therapy   Discussed with family   Possible discharge today        Marco Bruce, M.D.,FACP  06/10/2017  9:37 AM          *This note was generated by the Epic EMR system/ Dragon speech recognition and may contain inherent errors or omissions not intended by the user. Grammatical errors, random word insertions, deletions, pronoun errors and incomplete sentences are occasional consequences of this technology due to software limitations. Not all errors  are caught or corrected. If there are questions or concerns about the content of this note or information contained within the body of this dictation they should be addressed directly with the author for clarification

## 2017-06-11 NOTE — Progress Notes (Signed)
Transitional Care Management    Enrollment-    Name/Number of person who participated in call:  [name] (pt's daughter) and Georgina Snell (pt's grandson) [phone]    TCM Program explained to patient and TCM contact information provided: Yes    HIPAA verification completed and notified that call is recorded:  Yes     Primary language spoken: Georgina Snell and [name] speaks [place]    Interpreter needed:  An interpreter is not needed for Georgina Snell and Rattan (PMH, current admission summary, previous admission history):  Past Medical History:   Diagnosis Date   . Acute diastolic heart failure 11/65/7903   . Acute on chronic diastolic congestive heart failure 08/13/2016   . Acute pulmonary edema 05/26/2015   . ARF (acute renal failure) 05/26/2015   . Chronic diastolic heart failure 83/33/8329   . Chronic gout    . Chronic pain syndrome 07/07/2015   . CKD (chronic kidney disease) stage 4, GFR 15-29 ml/min     2018 baseline creatinine in EPIC is 1.5-2.3.   . Congestive heart failure (CHF)     EF 40% 5/18 echo, on lasix   . Current chronic use of systemic steroids     For Polymyalgia rheumatica   . Essential hypertension    . Mixed hyperlipidemia    . NSTEMI (non-ST elevated myocardial infarction) 08/13/2016   . Osteoarthritis of both knees    . Peripheral edema 06/16/2015   . Pneumonia due to infectious organism, unspecified laterality, unspecified part of lung 06/25/2015   . Polymyalgia rheumatica     chronic steroids   . 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 Second degree heart block by electrocardiogram (ECG) 04/27/2015   . Type 2 diabetes mellitus with complication, with long-term current use of insulin     12/01/2016 Hemoglobin A1c 8.2%     Patient Active Problem List   Diagnosis   . Acute renal failure   . Stage 4 chronic kidney disease   . Acute diastolic heart failure   . Renovascular hypertension   . Hyponatremia   . Essential hypertension   . Acute on chronic diastolic congestive heart failure   .  Hypokalemia   . Mixed hyperlipidemia   . Type 2 diabetes mellitus with complication, with long-term current use of insulin   . Acute cystitis without hematuria   . Generalized weakness   . Acute gout of left knee   . Fever and chills   . Acute renal failure superimposed on stage 4 chronic kidney disease   . Type 2 diabetes mellitus with hyperglycemia     Ollis Daudelin, was recently hospitalized at  [facility] from 2/28-06/10/2017 for CHF.    All eligible TCM Diagnoses (include A1C, EF, weight):       CHF EF 40%  5' 2''  160 lbs  LACE 15    How are you feeling? [name] reported that pt is okay    Are you feeling better or worse since your hospital discharge? [name] reported that pt is okay at this time      If so, what symptoms are you experiencing? No CHF signs and symptoms    Does the patient understand why he/she was admitted to the hospital: Per notes in chart, pt has underlying dementia. Georgina Snell and Manuela Neptune understand     Does the patient understand what the discharge diagnosis was: Yes     What was the patient's chief complaint upon hospital  arrival: Per Georgina Snell, hip pain      Medication Reconciliation-    Medication List:  Current Outpatient Prescriptions   Medication Sig Dispense Refill   . acetaminophen (TYLENOL) 325 MG tablet Take 2 tablets (650 mg total) by mouth every 4 (four) hours as needed for Fever (Temperature greater than 38 C/100.4 F).     Marland Kitchen albuterol (PROAIR HFA) 108 (90 Base) MCG/ACT inhaler Inhale 2 puffs into the lungs every 6 (six) hours as needed for Wheezing. 1 Inhaler 0   . allopurinol (ZYLOPRIM) 100 MG tablet Take 1 tablet (100 mg total) by mouth daily. 90 tablet 3   . atorvastatin (LIPITOR) 20 MG tablet Take 1 tablet (20 mg total) by mouth daily. 90 tablet 3   . cetirizine (ZYRTEC) 10 MG tablet Take 1 tablet (10 mg total) by mouth daily as needed for Rhinitis.     . cyclobenzaprine (FLEXERIL) 10 MG tablet Take 1 tablet (10 mg total) by mouth every 8 (eight) hours as needed for Muscle spasms. (Patient  not taking: Reported on 06/02/2017    ) 30 tablet 5   . docusate sodium (COLACE) 100 MG capsule Take 100 mg by mouth 2 (two) times daily.         Derrill Memo ON 06/13/2017] furosemide (LASIX) 20 MG tablet Take 2 tablets (40 mg total) by mouth daily. 180 tablet 3   . gabapentin (NEURONTIN) 100 MG capsule TAKE ONE CAPSULE BY MOUTH EVERY 8 HOURS 90 capsule 5   . guaiFENesin (ROBITUSSIN) 100 MG/5ML Solution Take 10 mLs (200 mg total) by mouth every 4 (four) hours as needed (cough).     . hydrALAZINE (APRESOLINE) 50 MG tablet Take 1 tablet (50 mg total) by mouth every morning. 90 tablet 3   . hypromellose (GONIOVISC, GONAK) 2.5 % ophthalmic solution Place 1 drop into both eyes as needed (prn dry eyes).     . insulin glargine (LANTUS SOLOSTAR) 100 UNIT/ML injection pen Inject 20 Units into the skin nightly. 9 mL 3   . insulin lispro (HUMALOG) 100 UNIT/ML injection Inject 1-3 Units into the skin nightly.Based on scale     . Insulin Pen Needle 31G X 5 MM Misc INJECT INSULIN DAILY AS DIRECTED 100 each 1   . Linagliptin 5 MG Tab Take 1 tablet (5 mg total) by mouth daily. 90 tablet 3   . magnesium hydroxide (MILK OF MAGNESIA) 400 MG/5ML suspension Take 30 mLs by mouth daily as needed for Constipation. 355 mL 0   . metoprolol succinate XL (TOPROL-XL) 25 MG 24 hr tablet Take 25 mg by mouth daily.     Marland Kitchen NIFEdipine (PROCARDIA XL) 60 MG 24 hr tablet TAKE 2 TABLETS BY MOUTH Every morning. (Patient taking differently: every evening.TAKE 2 TABLETS BY MOUTH Every morning.    ) 180 tablet 0   . pantoprazole (PROTONIX) 40 MG tablet TAKE ONE TABLET BY MOUTH EVERY DAY 90 tablet 3   . predniSONE (DELTASONE) 5 MG tablet Take 1 tablet (5 mg total) by mouth daily. (Patient not taking: Reported on 06/02/2017    ) 90 tablet 1   . Probiotic Product (RISAQUAD PO) Take 1 capsule by mouth daily.     . valsartan (DIOVAN) 80 MG tablet Take 1 tablet (80 mg total) by mouth daily. 90 tablet 3     No current facility-administered medications for this visit.         Were the medications reviewed with the patient:  Yes, reviewed with Georgina Snell  Was the patient able to obtain all of his/her medications when they left the hospital:  Yes     Does the patient understand what all of his/her medications are for: Yes    Patient aware of side effects from provided teaching in hospital or pamphlets: Yes     Was teaching done on medications: Yes     Plan for resolution of any medication concerns: None at this time    Physician Follow Up-    Was the patient scheduled/seen by PCP or cardiologist within critical timeframe of hospital discharge: [facility] visit expected within 48 hours. Follow up appts with the PCP, cardiologist, and nephrologist are not scheduled at this time.     Does patient understand importance of seeing a healthcare professional within critical timeframe of discharge: Yes    If appointment not scheduled within critical timeframe of discharge, what assistance offered to secure appointment: Georgina Snell stated that he is able to make appts-no assistance needed. CM discussed the importance of scheduling appts within 3-5 days.     Name/number of PCP: Darlina Sicilian June, DO [phone]    Name/number of cardiologist: [place] [phone]    Letter sent to PCP notifying of TCM involvement: Yes    Attempted outreach to PCP/Specialist to discuss patient care plan and updates: No, offices closed at this time. CM will make attempt on next week.       Home Health Co-Management-    Is patient being followed by Home Health: Yes     If yes:    Name/number of Home Health agency: [facility] [phone]    Letter sent to [place] agency notifying of TCM involvement: Yes    Summary of coordination/communication with Home Health agency staff: TCM home health letter faxed      CHF Management-    What is patient's understanding of CHF: Georgina Snell confirmed that he is aware of pt's renal and CHF diseases    Does the patient have a scale: Yes    Does the patient weigh  himself/herself daily: No. CM discussed the importance of pt performing weights daily      Does the patient understand the importance of when (in AM) s/he should be weighed and why (consistency and reporting): Yes    Has the patient's MD told him/her what his/her "dry weight" should be: No      What was weight since hospital discharge: Georgina Snell stated that the pt's most recent weight reading was 160 lbs    Is the patient keeping a weight log: No. CM discussed the importance of  Maintaining a daily log    Does the patient understand why "dry weight" is important: Yes    Was the patient educated on the CHF red flags/symptoms: Yes   Weight gain of 2 pounds in a day or 5 pounds in a week  Increased swelling of your hands, abdomen, legs, or ankles   Increase in shortness of breath with activity  Increase in the number of pillows needed to sleep at night  New or more frequent chest pain or tightness  New onset of dizziness or new onset of dizziness or lightheadedness after standing up    Is the patient currently experiencing any of the CHF red flags/symptoms: No       Was the patient instructed on what to do if he/she is experiencing any of the CHF red flags/symptoms: Yes     Is patient familiar with the CHF zones: Yes  Education on zones provided: Yes    Was [facility] TCM visit initiated: No, pt has home health      Does patient meet EF requirements for cardiac rehab?  No      Educational materials mailed to patient:  No      Call Summary Notes-    CM was able to speak with pt's daughter, Manuela Neptune and pt's grandson, Georgina Snell regarding the care of pt. Pt's resides with both family members. CM reviewed pt's medication list and Georgina Snell confirmed that pt has all of them. Georgina Snell understands the importance of pt following up with PCP and specialist. Georgina Snell also confirmed that he has the information for [facility] in case a call is not received by this afternoon. CM reiterated the importance of 48 hour touch from the home health  nurse.      Georgina Snell was also familiar with pt's CHF condition. CM emphasized the importance of pt weighing daily. Georgina Snell reported pt's most recent weight reading at 160 lbs. CM also reviewed CHF signs and symptoms. Georgina Snell denied that the pt is experiencing them at this time. Georgina Snell has my information and knows he can call.     CM will follow up.    Darnelle Bos, MSW  Case Manager  [facility]  [phone]

## 2017-06-13 ENCOUNTER — Encounter (INDEPENDENT_AMBULATORY_CARE_PROVIDER_SITE_OTHER): Payer: Self-pay

## 2017-06-13 NOTE — Progress Notes (Signed)
TCM Medicare Focus Diagnosis Navigator    Initial Call    Patient [facility] Admission Date: 06/02/2017    Hospital Discharge Date: 06/10/2017    Index Admission Diagnosis: CHF    Hospital Discharge Disposition: Home    Agencies Involved in Care: [place]       48 Hour Touch and Physician Involvement    PCP Name and Number: Darlina Sicilian June, DO @ [phone]     Index Diagnosis Specialist Name and Number:[facility] @ [phone]     48 Hour Touch Provider: [facility]    48 Hour Touch Scheduled Date: 06/10/2017    48 Hour Touch Completion Date: 06/11/2017      Home Care Involvement    Is home care involved: Yes    [place] Name/Number:[facility] [place]:  06/11/2017      Follow Up    Follow up needs: None    Follow up plan: Navigator will continue to follow patient per MFN protocol.       Comments:  Per Appt Desk, patient has no appointment with PCP, Darlina Sicilian June, DO @ [phone].    Navigator made outreach to [facility] @ [phone]  And spoke with Sarah(scheduler), who reported [facility] date 3/96/2019.    Navigator made outreach to [place] @ [phone] and office was closed.    CM will continue to follow patient per protocol.      Shary Key, BS  TCM Medicare Navigator  [facility]  [address], [place] [address]  T [phone] F [phone]

## 2017-06-15 ENCOUNTER — Encounter (INDEPENDENT_AMBULATORY_CARE_PROVIDER_SITE_OTHER): Payer: Self-pay | Admitting: Cardiovascular Disease

## 2017-06-15 NOTE — Progress Notes (Signed)
Transitional Care Management     How are you feeling? Georgina Snell reported that the pt is feeling fine    Are you feeling better or worse since your hospital discharge? Georgina Snell reported that the pt is feeling better      If so, what symptoms are you experiencing? No CHF signs and symptoms at this time    CHF Management-Teachback-  Does the patient understand what could make their heart failure worse (diet, liquids, activity, weight, smoking, infections): Yes    Can the patient identify the signs and symptoms that CHF is worsening: No-CM reviewed CHF signs and symptoms     Can the patient identify what to do if he/she experiences these symptoms: Yes      Physician Follow Up/Medication Follow Up-    Patient seen by PCP or cardiologist within critical timeframe of hospital discharge: No. Pt was seen by home health on 3/12. Pt is also scheduled to see cardiologist on 3/20 at 9:00 AM and nephrologist on 3/21 at 9:15 am. TCM Navigators notified.       Were any medications changed at follow up medical appointments: pending    Attempted outreach to PCP/Specialist to discuss patient care plan and updates: Yes     If yes, information discussed:  Yes, CM placed call to cardiologist's office to discuss pt's most recent hospitalization for CHF. CM provided correct dates for pt's hospitalization. CM was informed that they are aware of pt's CHF. CM also stated that pt is being followed by home health agency and that pt is not experiencing any symptoms at this time. CM provided her name and number for any further questions.         Depression Screen-    Over the past 4 weeks, has the patient felt down, depressed or hopeless:  No      Over the past 4 weeks, has the patients felt little interest or pleasure in doing things:  No      Gaps in Care/Resource Connection-    Living situation (alone, family, home environment, etc): Pt lives with his daughter, [name] and grandson, Willia Craze in Care (insurance status, medication coverage, community  connections, transportation, etc): Georgina Snell denied that pt needs any additional resources at this time    CDSMP Offered: Yes, but distance is too far    Referrals placed for patient: None    Call Summary Notes-    CM placed call to pt's grandson, Georgina Snell at [phone] for follow up on pt. Georgina Snell stated that the pt is doing well. Georgina Snell reported pt's current weight reading at 159 lbs. Georgina Snell was not able to teach back current signs and symptoms. CM reviewed CHF signs and symptoms and Georgina Snell denied that the pt is experiencing them at this time. Georgina Snell has my information and knows he can call.     CM will follow up.    Darnelle Bos, MSW  Case Manager  [facility]  [phone]

## 2017-06-16 ENCOUNTER — Encounter (INDEPENDENT_AMBULATORY_CARE_PROVIDER_SITE_OTHER): Payer: Self-pay | Admitting: Cardiovascular Disease

## 2017-06-21 ENCOUNTER — Encounter (INDEPENDENT_AMBULATORY_CARE_PROVIDER_SITE_OTHER): Payer: Self-pay

## 2017-06-21 NOTE — Progress Notes (Signed)
Transitional Care Management    How are you feeling? Pt is feeling okay    Are you feeling better or worse since your hospital discharge? Pt is feeling better      If so, what symptoms are you experiencing? Pt's weight has decreased to 151 lbs    CHF Management-Teachback-  Does the patient understand what could make their heart failure worse (diet, liquids, activity, weight, smoking, infections): Yes  Can the patient identify the signs and symptoms that CHF is worsening: Yes     Can the patient identify what to do if he/she experiences these symptoms: Yes      Wellness-    What is the patient's current/history of alcohol use/abuse: CM will ask on next call    What is the patient's current/history of tobacco use/abuse:  CM will ask on next call    Does the patient need/want information on assistance programs for alcohol abuse or smoking cessation?   CM will ask on next call    How often/much does the patient exercise: Pt participates in home health physical therapy    Has the patient been given exercise guidance from his/her doctor:  Yes    Education on exercise provided: Yes    Is the patient having any issues with sleeping well:  No        CHF Self management/Health Maintenance-    Has the patient's doctor recommended a special diet: Yes, pt drinks liquids, such as water and Ensure. Pt also eats oatmeal.     Has the patient been instructed on low salt and/or low fat intake: Yes    If on low salt diet, can patient describe how much is 2 grams of sodium: Yes    Diet education completed: Yes    Is the patient on fluid restriction: Yes    How many ounces of fluid can the patient have a day: Per Georgina Snell, 2 small glasses of Ensure and 2 small glasses of water    Has the patient been instructed on the following:   Influenza vaccine Yes   Pneumococcal vaccine Yes   Health record Yes  Hand hygiene Yes    Advance Directives-    Has the patient participated in an advance care planning process:  Georgina Snell stated that he has to speak  with his mother to find out if pt has an advanced directive. CM will follow up on this topic.      Call Summary Notes-      CM placed call to pt's grandson, Georgina Snell at [phone] for follow up on pt. Georgina Snell stated that pt has been doing well. Georgina Snell reported that pt's weight has decreased to 151 lbs. CM inquired about pt's dry weight, and Georgina Snell stated that it is supposed to be 154 lbs. CM noted that weight loss can be just as concerning as weight gain. Georgina Snell agreed to report pt's weight loss to pt's cardiologist on tomorrow, 3/20. CM reviewed all other CHF signs and symptoms, and denied that the pt is experiencing them. Georgina Snell has my information and knows he can call.     CM will follow up.    Darnelle Bos, MSW  Case Manager  [facility]  [phone]

## 2017-06-21 NOTE — Progress Notes (Signed)
TCM Medicare Focus Diagnosis Navigator    TCM Case managed follow-up call    TCM Case Manager Assigned: Darnelle Bos    Last date of contact by TCM case manager: 06/21/2017    Current Agencies Involved in Care: n/a    Agencies contacted regarding patient care/upcoming appts: n/a    Upcoming appts scheduled: Cardio 3/20    Communication to agency(s) regarding patient's care/status: n/a    Comments:  Per CM notes, patient has appointment on 06/22/2017 at 9:00AM with Cardiology and patient is aware of appointment.     CM will continue to follow patient per protocol.    Shary Key, BS  TCM Medicare Navigator  [facility]  [address], [place] [address]  T [phone] F [phone]

## 2017-06-22 ENCOUNTER — Ambulatory Visit (INDEPENDENT_AMBULATORY_CARE_PROVIDER_SITE_OTHER): Payer: Self-pay | Admitting: Medical

## 2017-06-23 ENCOUNTER — Other Ambulatory Visit: Payer: Self-pay | Admitting: Cardiovascular Disease

## 2017-06-23 ENCOUNTER — Encounter (INDEPENDENT_AMBULATORY_CARE_PROVIDER_SITE_OTHER): Payer: Self-pay

## 2017-06-23 DIAGNOSIS — I5032 Chronic diastolic (congestive) heart failure: Secondary | ICD-10-CM

## 2017-06-23 NOTE — Progress Notes (Signed)
TCM Medicare Focus Diagnosis Navigator    Appointment Confirmation Call    Appointment date/time/physician: 06/22/2017 at 9:00AM with [place], PA Alexander    Call placed to physician office ([facility]) at [phone] to confirm if patient attended scheduled appointment.    Confirmed that patient was seen on scheduled day/time:  Yes      If No, Why?: n/a    Agencies contacted if patient did not attend appointment: n/a      Comments:  Follow up on 07/22/2017 at 9:30AM with PA Alisha.    Shary Key, BS  TCM Medicare Navigator  [facility]  [address], [place] [address]  T [phone] F [phone]

## 2017-06-24 NOTE — Progress Notes (Signed)
Transitional Care Management    CM placed call to pt's grandson, Georgina Snell at [phone] for general follow up on pt. Georgina Snell stated that pt has been doing well and that weight is stable. Georgina Snell confirmed that pt saw cardiologist on 3/20 and there were no medication changes. Georgina Snell also stated that pt is scheduled to see nephrologist on 5/20. In addition, CM inquired about whether pt has an advanced directive, and Georgina Snell stated that his mother is currently helping patient with one. CM informed Georgina Snell to let CM know if she has any additional questions. Georgina Snell denied that pt uses any tobacco or alcohol. Georgina Snell has my information and knows he can call.     CM will follow up.    Darnelle Bos, MSW  Case Manager  [facility]  [phone]

## 2017-06-29 ENCOUNTER — Encounter (INDEPENDENT_AMBULATORY_CARE_PROVIDER_SITE_OTHER): Payer: Self-pay

## 2017-06-29 NOTE — Progress Notes (Signed)
TCM Medicare Focus Diagnosis Navigator    TCM Case managed follow-up call    TCM Case Manager Assigned: Darnelle Bos    Last date of contact by TCM case manager: 06/24/2017    Current Agencies Involved in Care: n/a    Agencies contacted regarding patient care/upcoming appts: n/a    Upcoming appts scheduled: n/a    Communication to agency(s) regarding patient's care/status: n/a    Comments:  Per CM notes, no new appointments.     Shary Key, BS  TCM Medicare Navigator  [facility]  [address], [place] [address]  T [phone] F [phone]

## 2017-06-30 ENCOUNTER — Telehealth (INDEPENDENT_AMBULATORY_CARE_PROVIDER_SITE_OTHER): Payer: Self-pay | Admitting: Family Medicine

## 2017-06-30 NOTE — Telephone Encounter (Signed)
Kathlee Nations, Nurse from [facility] visited patient today and called to report his BP was 164/82 and pulse of 80.  Per Kathlee Nations, he is asymptomatic with regards to a hypertensive issue.  If you need to contact her for any reason, her call back # is [phone]

## 2017-06-30 NOTE — Progress Notes (Signed)
Transitional Care Management    How are you feeling? Marco Bruce reported that the pt is feeling well    Are you feeling better or worse since your hospital discharge? Marco Bruce reported that the pt is feeling better      If so, what symptoms are you experiencing? No CHF signs and symptoms    CHF Management-Teachback-    Does the patient understand what could make their heart failure worse (diet, liquids, activity, weight, smoking, infections): Yes    Can the patient identify the signs and symptoms that CHF is worsening: Yes     Can the patient identify what to do if he/she experiences these symptoms: Yes        Call Summary Notes-    CM placed call to pt's grandson, Marco Bruce for follow up on pt. Marco Bruce stated that the pt is doing well. Marco Bruce reported pt's weight at 149 lbs. Marco Bruce also confirmed that pt saw cardiologist on 3/20 and there were no medication changes. Marco Bruce denied that pt is experiencing CHF signs and symptoms at this time. Marco Bruce has my information and knows he can call.     CM will follow up.    Darnelle Bos, MSW  Case Manager  [facility]  [phone]

## 2017-06-30 NOTE — Telephone Encounter (Signed)
Patient need seen for hospital follow up. Not seen since hosp discharge., Please make sure he has no symptoms/  Also he has appt with cardiology.

## 2017-07-01 NOTE — Telephone Encounter (Signed)
Marco Bruce (grandson of patient and on hippa form) stated that patient is asymptomatic and has f/u with cardiology 4/5. In addition, he is scheduling a hospital f/u with Dr. Herbert Pun

## 2017-07-04 ENCOUNTER — Ambulatory Visit (INDEPENDENT_AMBULATORY_CARE_PROVIDER_SITE_OTHER): Payer: Medicare Other | Admitting: Family Medicine

## 2017-07-04 ENCOUNTER — Encounter (INDEPENDENT_AMBULATORY_CARE_PROVIDER_SITE_OTHER): Payer: Self-pay | Admitting: Family Medicine

## 2017-07-04 VITALS — BP 118/68 | HR 77 | Temp 97.9°F | Wt 156.0 lb

## 2017-07-04 DIAGNOSIS — I10 Essential (primary) hypertension: Secondary | ICD-10-CM

## 2017-07-04 DIAGNOSIS — I5031 Acute diastolic (congestive) heart failure: Secondary | ICD-10-CM

## 2017-07-04 DIAGNOSIS — N184 Chronic kidney disease, stage 4 (severe): Secondary | ICD-10-CM

## 2017-07-04 DIAGNOSIS — I150 Renovascular hypertension: Secondary | ICD-10-CM

## 2017-07-04 DIAGNOSIS — E118 Type 2 diabetes mellitus with unspecified complications: Secondary | ICD-10-CM

## 2017-07-04 DIAGNOSIS — I130 Hypertensive heart and chronic kidney disease with heart failure and stage 1 through stage 4 chronic kidney disease, or unspecified chronic kidney disease: Secondary | ICD-10-CM

## 2017-07-04 DIAGNOSIS — I129 Hypertensive chronic kidney disease with stage 1 through stage 4 chronic kidney disease, or unspecified chronic kidney disease: Secondary | ICD-10-CM

## 2017-07-04 DIAGNOSIS — E782 Mixed hyperlipidemia: Secondary | ICD-10-CM

## 2017-07-04 DIAGNOSIS — Z794 Long term (current) use of insulin: Secondary | ICD-10-CM

## 2017-07-04 NOTE — Progress Notes (Signed)
Have you seen any specialists/other providers since your last visit with [place]?    Yes- cardiologist    Arm preference verified?   Yes    The patient is due for eye exam, shingles vaccine and pneumonia vaccine    Subjective:      Patient ID: Marco Bruce is a 84 y.o. male.    Chief Complaint:  Chief Complaint   Patient presents with   . Hypertension     f/u hospital visit       HPI:  84 yr old male in with for hosp fu  In with Jackson who translated entire visit    Here for follow-up from recent hospitalization.   Inpatient facility: [facility]  Date of admission: February 28  Date of discharge: March 8  Discharge diagnosis: CHF  Hospital course:   Was not alert at home  So was taken to ER  Acute on chronic CHF - Diastolic heart failure      Seen dr Nathen May post discharge  Echo scheduled for 07/08/2017 and get pacemaker checked    ARF - sees Dr Kate Sable  Has appt May 20th      DM - FBS 156  Done with prednisone  lantus 20 units in the evening    Prior hospitalization within past 30 days: No   Prior hospitalization within past 6 months: Yes  Comorbid conditions: CHF, CKD and diabetes  Medical procedure(s) performed: diuretuc and IV antibiotic  Surgical procedure(s) performed: none  Diagnostic tests performed: chest x-ray and CT chest  Pending tests: none  Home care: ADL's intact, home PT assisting with care and skilled nursing assisting with care  Specialist follow-up: cardiology and hematology  Post-hospital monitoring: daily weights and ambulatory BP monitoring  Mobility status: ADL's intact and poor mobility     Medication Reconciliation(required):  Hospital discharge medications reviewed and reconciled with current medication list. Patient is adherent with hospital discharge medication regimen.    Advanced Directives:  Not addressed today.    Problem List:  Patient Active Problem List   Diagnosis   . Acute renal failure   . Stage 4 chronic kidney disease   . Acute diastolic heart failure   .  Renovascular hypertension   . Hyponatremia   . Essential hypertension   . Acute on chronic diastolic congestive heart failure   . Hypokalemia   . Mixed hyperlipidemia   . Type 2 diabetes mellitus with complication, with long-term current use of insulin   . Acute cystitis without hematuria   . Generalized weakness   . Acute gout of left knee   . Fever and chills   . Acute renal failure superimposed on stage 4 chronic kidney disease   . Type 2 diabetes mellitus with hyperglycemia       Current Medications:  Current Outpatient Prescriptions   Medication Sig Dispense Refill   . acetaminophen (TYLENOL) 325 MG tablet Take 2 tablets (650 mg total) by mouth every 4 (four) hours as needed for Fever (Temperature greater than 38 C/100.4 F).     Marland Kitchen albuterol (PROAIR HFA) 108 (90 Base) MCG/ACT inhaler Inhale 2 puffs into the lungs every 6 (six) hours as needed for Wheezing. 1 Inhaler 0   . allopurinol (ZYLOPRIM) 100 MG tablet Take 1 tablet (100 mg total) by mouth daily. 90 tablet 3   . atorvastatin (LIPITOR) 20 MG tablet Take 1 tablet (20 mg total) by mouth daily. 90 tablet 3   . cetirizine (ZYRTEC)  10 MG tablet Take 1 tablet (10 mg total) by mouth daily as needed for Rhinitis.     . cyclobenzaprine (FLEXERIL) 10 MG tablet Take 1 tablet (10 mg total) by mouth every 8 (eight) hours as needed for Muscle spasms. 30 tablet 5   . docusate sodium (COLACE) 100 MG capsule Take 100 mg by mouth 2 (two) times daily.         . furosemide (LASIX) 20 MG tablet Take 2 tablets (40 mg total) by mouth daily. 180 tablet 3   . gabapentin (NEURONTIN) 100 MG capsule TAKE ONE CAPSULE BY MOUTH EVERY 8 HOURS 90 capsule 5   . guaiFENesin (ROBITUSSIN) 100 MG/5ML Solution Take 10 mLs (200 mg total) by mouth every 4 (four) hours as needed (cough).     . hydrALAZINE (APRESOLINE) 50 MG tablet Take 1 tablet (50 mg total) by mouth every morning. 90 tablet 3   . insulin glargine (LANTUS SOLOSTAR) 100 UNIT/ML injection pen Inject 20 Units into the skin nightly. 9  mL 3   . Insulin Pen Needle 31G X 5 MM Misc INJECT INSULIN DAILY AS DIRECTED 100 each 1   . Linagliptin 5 MG Tab Take 1 tablet (5 mg total) by mouth daily. 90 tablet 3   . magnesium hydroxide (MILK OF MAGNESIA) 400 MG/5ML suspension Take 30 mLs by mouth daily as needed for Constipation. 355 mL 0   . metoprolol succinate XL (TOPROL-XL) 25 MG 24 hr tablet Take 25 mg by mouth daily.     Marland Kitchen NIFEdipine (PROCARDIA XL) 60 MG 24 hr tablet TAKE 2 TABLETS BY MOUTH Every morning. (Patient taking differently: every evening.TAKE 2 TABLETS BY MOUTH Every morning.    ) 180 tablet 0   . pantoprazole (PROTONIX) 40 MG tablet TAKE ONE TABLET BY MOUTH EVERY DAY 90 tablet 3   . Probiotic Product (RISAQUAD PO) Take 1 capsule by mouth daily.     . valsartan (DIOVAN) 80 MG tablet Take 1 tablet (80 mg total) by mouth daily. 90 tablet 3     No current facility-administered medications for this visit.        Allergies:  No Known Allergies    Past Medical History:  Past Medical History:   Diagnosis Date   . Acute diastolic heart failure 03/52/4818   . Acute on chronic diastolic congestive heart failure 08/13/2016   . Acute pulmonary edema 05/26/2015   . ARF (acute renal failure) 05/26/2015   . Chronic diastolic heart failure 59/12/3110   . Chronic gout    . Chronic pain syndrome 07/07/2015   . CKD (chronic kidney disease) stage 4, GFR 15-29 ml/min     2018 baseline creatinine in EPIC is 1.5-2.3.   . Congestive heart failure (CHF)     EF 40% 5/18 echo, on lasix   . Current chronic use of systemic steroids     For Polymyalgia rheumatica   . Essential hypertension    . Mixed hyperlipidemia    . NSTEMI (non-ST elevated myocardial infarction) 08/13/2016   . Osteoarthritis of both knees    . Peripheral edema 06/16/2015   . Pneumonia due to infectious organism, unspecified laterality, unspecified part of lung 06/25/2015   . Polymyalgia rheumatica     chronic steroids   . 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 Second degree heart block by  electrocardiogram (ECG) 04/27/2015   . Type 2 diabetes mellitus with complication, with long-term current use of insulin     12/01/2016 Hemoglobin A1c 8.2%  Past Surgical History:  Past Surgical History:   Procedure Laterality Date   . ABDOMINAL SURGERY     . CHOLECYSTECTOMY     . PACEMAKER Left 2017       Family History:  History reviewed. No pertinent family history.    Social History:  Social History     Social History   . Marital status: Widowed     Spouse name: N/A   . Number of children: N/A   . Years of education: N/A     Occupational History   . Not on file.     Social History Main Topics   . Smoking status: Never Smoker   . Smokeless tobacco: Never Used   . Alcohol use No      Comment: former alcoholic   . Drug use: No   . Sexual activity: Not on file     Other Topics Concern   . Not on file     Social History Narrative   . No narrative on file       The following sections were reviewed this encounter by the provider:   Tobacco  Allergies  Meds  Problems  Med Hx  Surg Hx  Fam Hx  Soc Hx           ROS:  Review of Systems    Vitals:  BP 118/68 (BP Site: Right arm, Patient Position: Sitting, Cuff Size: Medium)   Pulse 77   Temp 97.9 F (36.6 C) (Oral)   Wt 70.8 kg (156 lb)   SpO2 96%   BMI 28.53 kg/m     ROS:  General/Constitutional:   Denies Chills. Denies Fatigue. Denies Fever.   Ophthalmologic:   Denies Blurred vision. Denies Eye Pain.   ENT:   Denies Nasal Discharge. Denies Ear pain. Denies Sinus pain.   Endocrine:   Denies Polydipsia. Denies Polyuria.   Respiratory:   Denies Cough. Denies Orthopnea. Denies Shortness of breath. Denies Wheezing.   Cardiovascular:   Denies Chest pain. Denies Chest pain with exertion. Denies Leg Claudication. Denies  Palpitations. Denies Swelling in hands/feet.   Gastrointestinal:   Denies Abdominal pain. Denies Blood in stool. Denies Constipation. Denies Diarrhea.  Denies Heartburn. Denies Nausea. Denies Vomiting.   Genitourinary:   Denies Blood in urine.  Denies Frequent urination. Denies Painful urination.   Musculoskeletal:   Denies Leg cramps. Denies Muscle aches. + knee pain  Skin:   Denies Skin lesion(s).   Neurologic:   Denies Dizziness. Denies Gait abnormality. Denies Headache. Denies  Tingling/Numbness.     Physical Exam:  Constitutional: Well-developed, well-nourished. In no acute distress.   Head: Atraumatic. Normocephalic.  Neck: Normal range of motion. Neck supple. No carotid bruits.  Cardiovascular: Normal rate. Regular rhythm, no S3, normal S1, normal S2, no S4. No murmur heard. No rubs, thrills, or lifts.   Pulmonary/Chest: Breath sounds normal. No wheezing, rhonchi, crackles, cough.   Abdominal: Bowel sounds are normal. Soft, non-tender, non-distended. No palpable masses. There is no rebound, rigidity, or guarding.   Musculoskeletal: Normal range of motion, + knee tenderness   Neurological: Alert. Awake. Oriented to person, place, and time. He walks with a cane due to B/L knee pain.  Extremities: Normal distal pulses. No edema on exam,No clubbing. No cyanosis.  Skin: Skin is warm and moist. No exanthem    Lab Results   Component Value Date    WBC 9.10 06/10/2017    HGB 10.5 (L) 06/10/2017    HCT 31.1 (L) 06/10/2017  PLT 259 06/10/2017    CHOL 113 (L) 05/31/2006    TRIG 125 05/31/2006    LDL 45 (L) 05/31/2006    ALT 19 06/07/2017    AST 45 (H) 06/07/2017    NA 141 06/10/2017    K 4.6 06/10/2017    CL 105 06/10/2017    CREAT 2.2 (H) 06/10/2017    BUN 65.0 (H) 06/10/2017    CO2 27 06/10/2017    TSH 2.54 06/02/2017    PSA 1.0 01/12/2006    INR 1.0 08/13/2016    GLU 121 (H) 06/10/2017    HGBA1C 7.6 (H) 06/02/2017     CXR:  IMPRESSION:    New patchy airspace opacities in the left basal indicative  of atelectasis or pneumonia in the correct clinical setting.    Ferd Hibbs, MD   06/05/2017 10:32 AM    Knee X ray:  IMPRESSION:     1. No acute findings or evidence of erosion.  2. Tri compartment degenerative arthritis greatest medially.  Moderate  joint effusion.    Lupita Leash, MD   06/03/2017 3:45 PM    Assessment:     1. Acute diastolic heart failure  Stable  Discussed with grandson-need daily weight  Call if wt gain >3 pounds in a day  Monitor for breathing issue, leg swelling  See cardio as scheduled  ON Beta blocker,hydralazine an dlasix  Echo scheduled   2. Renovascular hypertension  Sees Nephro  3. Essential hypertension  BP stable now  Monitor at home  continue current medication    4. Stage 4 chronic kidney disease  EFR 28  Need monitored  Refusing labs today!  Wants to do all labs prior to seeing nephro    5. Mixed hyperlipidemia  On lipitor 20 mg  6. Type 2 diabetes mellitus with complication, with long-term current use of insulin  On lantus 20 units  Monitor blood sugar  Small freq meals    Plan:   Disease/Illness Education:  Patient educated regarding symptom management, adherence to current medication regimen and current plan of care. Patient encouraged to contact us with any questions or needs.  Patient agreeable to current plan of care and verbalizes understanding of information provided.  Home Health Needs:  Home health care needs are being met by family. , home health nursing  and physical therapy  Community Service Needs:  No community services needed at this time.  Establishment of Referral Orders:  No referrals needed at this time.  Communication with Health Care Providers:  No active communication with additional health care providers needed at this time.    Continue home pT  Fu in 4-5 weeks but soon if s/s  Simpson Paulos Sidonie Dickens, MD

## 2017-07-07 ENCOUNTER — Encounter (INDEPENDENT_AMBULATORY_CARE_PROVIDER_SITE_OTHER): Payer: Self-pay

## 2017-07-07 NOTE — Progress Notes (Signed)
Transitional Care Management    How are you feeling? Georgina Snell reported that the pt is feeling well    Are you feeling better or worse since your hospital discharge? Georgina Snell reported that the pt is feeling better      If so, what symptoms are you experiencing? No CHF signs and symptoms    CHF Management-Teachback-    Does the patient understand what could make their heart failure worse (diet, liquids, activity, weight, smoking, infections): Yes     Can the patient identify the signs and symptoms that CHF is worsening: Yes     Can the patient identify what to do if he/she experiences these symptoms: Yes        Call Summary Notes-    CM placed final call to pt's grandson, Georgina Snell. Georgina Snell stated that pt is doing well. Georgina Snell reported pt's weight at 152 lbs. Georgina Snell denied that pt is experiencing CHF signs and symptoms at this time. Georgina Snell has my information and knows he can call for any future concerns.    TCM case will be closed on 07/11/17 as all pillars have been completed.    Darnelle Bos, MSW  Case Manager  [facility]  [phone]

## 2017-07-07 NOTE — Progress Notes (Signed)
TCM Medicare Focus Diagnosis Navigator    TCM Case managed follow-up call    TCM Case Manager Assigned: Darnelle Bos    Last date of contact by TCM case manager: 07/07/2017    Current Agencies Involved in Care: n/a    Agencies contacted regarding patient care/upcoming appts: n/a    Upcoming appts scheduled: n/a    Communication to agency(s) regarding patient's care/status: n/a    Comments:  Per CM notes, no new appointments.     Shary Key, BS  TCM Medicare Navigator  [facility]  [address], [place] [address]  T [phone] F [phone]

## 2017-07-08 ENCOUNTER — Other Ambulatory Visit (INDEPENDENT_AMBULATORY_CARE_PROVIDER_SITE_OTHER): Payer: Self-pay

## 2017-07-08 ENCOUNTER — Ambulatory Visit
Admission: RE | Admit: 2017-07-08 | Discharge: 2017-07-08 | Disposition: A | Discharge status: Home / Self Care | Payer: Medicare Other | Source: Ambulatory Visit | Attending: Cardiovascular Disease | Admitting: Cardiovascular Disease

## 2017-07-08 DIAGNOSIS — I5032 Chronic diastolic (congestive) heart failure: Secondary | ICD-10-CM | POA: Insufficient documentation

## 2017-07-08 DIAGNOSIS — I080 Rheumatic disorders of both mitral and aortic valves: Secondary | ICD-10-CM | POA: Insufficient documentation

## 2017-07-08 DIAGNOSIS — R931 Abnormal findings on diagnostic imaging of heart and coronary circulation: Secondary | ICD-10-CM | POA: Insufficient documentation

## 2017-07-20 ENCOUNTER — Encounter (INDEPENDENT_AMBULATORY_CARE_PROVIDER_SITE_OTHER): Payer: Self-pay | Admitting: Family Medicine

## 2017-07-21 ENCOUNTER — Encounter (INDEPENDENT_AMBULATORY_CARE_PROVIDER_SITE_OTHER): Payer: Self-pay | Admitting: Family Medicine

## 2017-07-21 LAB — VAHRT HISTORIC LVEF: Ejection Fraction: 40 %

## 2017-08-03 ENCOUNTER — Encounter (INDEPENDENT_AMBULATORY_CARE_PROVIDER_SITE_OTHER): Payer: Self-pay | Admitting: Family Medicine

## 2017-08-04 ENCOUNTER — Other Ambulatory Visit (INDEPENDENT_AMBULATORY_CARE_PROVIDER_SITE_OTHER): Payer: Self-pay | Admitting: Family Medicine

## 2017-08-04 DIAGNOSIS — I10 Essential (primary) hypertension: Secondary | ICD-10-CM

## 2017-08-04 DIAGNOSIS — E1122 Type 2 diabetes mellitus with diabetic chronic kidney disease: Secondary | ICD-10-CM

## 2017-08-04 NOTE — Telephone Encounter (Signed)
LOV 07/04/17

## 2017-08-05 ENCOUNTER — Encounter (INDEPENDENT_AMBULATORY_CARE_PROVIDER_SITE_OTHER): Payer: Self-pay | Admitting: Family Medicine

## 2017-08-05 MED ORDER — LANTUS SOLOSTAR 100 UNIT/ML SC SOPN
20.00 [IU] | PEN_INJECTOR | Freq: Every evening | SUBCUTANEOUS | 3 refills | Status: DC
Start: 2017-08-05 — End: 2017-11-07

## 2017-08-05 MED ORDER — NIFEDIPINE ER OSMOTIC RELEASE 60 MG PO TB24
ORAL_TABLET | ORAL | 0 refills | Status: DC
Start: 2017-08-05 — End: 2017-12-08

## 2017-08-07 NOTE — Progress Notes (Signed)
Subjective:      Date: 08/08/2017 8:33 AM   Patient ID: Marco Bruce is a 84 y.o. male.    Chief Complaint:  Chief Complaint   Patient presents with   . Diabetes   . Medication Refill       HPI:  84 yr old male with DM , CHF , multiple med problem in for fu    In with Grandson - Marco Bruce who translated entire visit      DM - checking sugar and runs in the 120  Today FBS 150  No hypoglycemia episode  On Lantus at 20 unit at bedtime  Not on meal time insulin  Last eye exam - last year     Has CHD - dias dysfunction  Sees cardio q 3 months to check the pacemaker-seen them last month  Feels his weight has gone up - 3 to 4 pound sin 1 week  No change in breathing  No chest pain, no orthopnea, No PND           HPI    Problem List:  Patient Active Problem List   Diagnosis   . Acute renal failure   . Stage 4 chronic kidney disease   . Acute diastolic heart failure   . Renovascular hypertension   . Hyponatremia   . Essential hypertension   . Acute on chronic diastolic congestive heart failure   . Hypokalemia   . Mixed hyperlipidemia   . Type 2 diabetes mellitus with complication, with long-term current use of insulin   . Acute cystitis without hematuria   . Generalized weakness   . Acute gout of left knee   . Fever and chills   . Acute renal failure superimposed on stage 4 chronic kidney disease   . Type 2 diabetes mellitus with hyperglycemia       Current Medications:  Current Outpatient Prescriptions   Medication Sig Dispense Refill   . acetaminophen (TYLENOL) 325 MG tablet Take 2 tablets (650 mg total) by mouth every 4 (four) hours as needed for Fever (Temperature greater than 38 C/100.4 F).     Marland Kitchen albuterol (PROAIR HFA) 108 (90 Base) MCG/ACT inhaler Inhale 2 puffs into the lungs every 6 (six) hours as needed for Wheezing. 1 Inhaler 0   . allopurinol (ZYLOPRIM) 100 MG tablet Take 1 tablet (100 mg total) by mouth daily. 90 tablet 3   . atorvastatin (LIPITOR) 20 MG tablet Take 1 tablet (20 mg total) by mouth daily. 90 tablet 3    . cetirizine (ZYRTEC) 10 MG tablet Take 1 tablet (10 mg total) by mouth daily as needed for Rhinitis.     . cyclobenzaprine (FLEXERIL) 10 MG tablet Take 1 tablet (10 mg total) by mouth every 8 (eight) hours as needed for Muscle spasms. 30 tablet 5   . docusate sodium (COLACE) 100 MG capsule Take 100 mg by mouth 2 (two) times daily.         . furosemide (LASIX) 20 MG tablet Take 2 tablets (40 mg total) by mouth daily. 180 tablet 3   . gabapentin (NEURONTIN) 100 MG capsule TAKE ONE CAPSULE BY MOUTH EVERY 8 HOURS 90 capsule 5   . hydrALAZINE (APRESOLINE) 50 MG tablet Take 1 tablet (50 mg total) by mouth every morning. 90 tablet 3   . insulin glargine (LANTUS SOLOSTAR) 100 UNIT/ML injection pen Inject 20 Units into the skin nightly 9 mL 3   . Insulin Pen Needle 31G X 5 MM Misc INJECT  INSULIN DAILY AS DIRECTED 100 each 1   . Linagliptin 5 MG Tab Take 1 tablet (5 mg total) by mouth daily. 90 tablet 3   . magnesium hydroxide (MILK OF MAGNESIA) 400 MG/5ML suspension Take 30 mLs by mouth daily as needed for Constipation. 355 mL 0   . metoprolol succinate XL (TOPROL-XL) 25 MG 24 hr tablet Take 25 mg by mouth daily.     Marland Kitchen NIFEdipine (PROCARDIA XL) 60 MG 24 hr tablet TAKE 2 TABLETS BY MOUTH Every morning. 180 tablet 0   . pantoprazole (PROTONIX) 40 MG tablet TAKE ONE TABLET BY MOUTH EVERY DAY 90 tablet 3   . Probiotic Product (RISAQUAD PO) Take 1 capsule by mouth daily.     . valsartan (DIOVAN) 80 MG tablet Take 1 tablet (80 mg total) by mouth daily. 90 tablet 3     No current facility-administered medications for this visit.        Allergies:  No Known Allergies    Past Medical History:  Past Medical History:   Diagnosis Date   . Acute diastolic heart failure 81/59/4707   . Acute on chronic diastolic congestive heart failure 08/13/2016   . Acute pulmonary edema 05/26/2015   . ARF (acute renal failure) 05/26/2015   . Chronic diastolic heart failure 61/51/8343   . Chronic gout    . Chronic pain syndrome 07/07/2015   . CKD  (chronic kidney disease) stage 4, GFR 15-29 ml/min     2018 baseline creatinine in EPIC is 1.5-2.3.   . Congestive heart failure (CHF)     EF 40% 5/18 echo, on lasix   . Current chronic use of systemic steroids     For Polymyalgia rheumatica   . Essential hypertension    . Mixed hyperlipidemia    . NSTEMI (non-ST elevated myocardial infarction) 08/13/2016   . Osteoarthritis of both knees    . Peripheral edema 06/16/2015   . Pneumonia due to infectious organism, unspecified laterality, unspecified part of lung 06/25/2015   . Polymyalgia rheumatica     chronic steroids   . 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 Second degree heart block by electrocardiogram (ECG) 04/27/2015   . Type 2 diabetes mellitus with complication, with long-term current use of insulin     12/01/2016 Hemoglobin A1c 8.2%       Past Surgical History:  Past Surgical History:   Procedure Laterality Date   . ABDOMINAL SURGERY     . CHOLECYSTECTOMY     . PACEMAKER Left 2017       Family History:  History reviewed. No pertinent family history.    Social History:  Social History     Social History   . Marital status: Widowed     Spouse name: N/A   . Number of children: N/A   . Years of education: N/A     Occupational History   . Not on file.     Social History Main Topics   . Smoking status: Never Smoker   . Smokeless tobacco: Never Used   . Alcohol use No      Comment: former alcoholic   . Drug use: No   . Sexual activity: Not on file     Other Topics Concern   . Not on file     Social History Narrative   . No narrative on file       The following sections were reviewed this encounter by the provider:   Tobacco  Allergies  Meds  Problems  Med Hx  Surg Hx  Fam Hx  Soc Hx          Vitals:  BP 176/77 (BP Site: Left arm, Patient Position: Sitting, Cuff Size: Medium)   Pulse 83   Temp 97.8 F (36.6 C) (Oral)   Ht 1.575 m (5\' 2" )   Wt 76.7 kg (169 lb)   SpO2 95%   BMI 30.91 kg/m       ROS:  General/Constitutional:   Denies Chills. Denies  Fatigue. Denies Fever.   Ophthalmologic:   Denies Blurred vision. Denies Eye Pain.   ENT:   Denies Nasal Discharge. Denies Ear pain. Denies Sinus pain.   Endocrine:   Denies Polydipsia. Denies Polyuria.   Respiratory:   occ Cough. Denies Orthopnea. Denies Shortness of breath. +Wheezing.   Cardiovascular:   Denies Chest pain. Denies Chest pain with exertion. Denies Leg Claudication. Denies  Palpitations. Denies Swelling in hands/feet.   Gastrointestinal:   Denies Abdominal pain. Denies Blood in stool. Denies Constipation. Denies Diarrhea.  Denies Heartburn. Denies Nausea. Denies Vomiting.   Genitourinary:   Denies Blood in urine. Denies Frequent urination. Denies Painful urination.     Physical Exam:  Physical Exam:  Constitutional: Well-developed, well-nourished. In no acute distress.   Head: Atraumatic. Normocephalic.  Neck: Normal range of motion. Neck supple. No carotid bruits.  Cardiovascular: Normal rate.  Regular rhythm, no S3, normal S1, normal S2, no S4.  No murmur heard. No rubs, thrills, or lifts.   Pulmonary/Chest: Breath sounds normal. No wheezing. Left LL rales ++  Abdominal: Bowel sounds are normal. Soft, non-tender, non-distended. No palpable masses. There is no rebound, rigidity, or guarding.   Musculoskeletal: Normal range of motion, + knee tenderness   Neurological: Alert. Awake.  Oriented to person, place, and time.  He walks with a cane due to B/L knee pain.  Extremities: Normal distal pulses. No edema on exam, No clubbing.  No cyanosis.  Skin: Skin is warm and moist. No exanthem  No pitting edema  Sensory exam of the foot is normal, tested with the monofilament. Good pulses, no lesions or ulcers, good peripheral pulses.    Lab Results   Component Value Date    CHOL 113 (L) 05/31/2006    CHOL 163 01/12/2006    TRIG 125 05/31/2006    TRIG 76 01/12/2006    LDL 45 (L) 05/31/2006    LDL 106 01/12/2006       Lab Results   Component Value Date    HGBA1C 7.6 (H) 06/02/2017    HGBA1C 8.2 (A) 12/01/2016     HGBA1C 7.1 (H) 08/13/2016     Lab Results   Component Value Date    WBC 9.10 06/10/2017    HGB 10.5 (L) 06/10/2017    HCT 31.1 (L) 06/10/2017    PLT 259 06/10/2017    CHOL 113 (L) 05/31/2006    TRIG 125 05/31/2006    LDL 45 (L) 05/31/2006    ALT 19 06/07/2017    AST 45 (H) 06/07/2017    NA 141 06/10/2017    K 4.6 06/10/2017    CL 105 06/10/2017    CREAT 2.2 (H) 06/10/2017    BUN 65.0 (H) 06/10/2017    CO2 27 06/10/2017    TSH 2.54 06/02/2017    PSA 1.0 01/12/2006    INR 1.0 08/13/2016    GLU 121 (H) 06/10/2017    HGBA1C 7.6 (H) 06/02/2017     Last 3 Weights for the past  72 hrs (Last 3 readings):   Weight   08/08/17 0807 76.7 kg (169 lb)         Assessment/Plan:       1. Acute on chronic diastolic congestive heart failure  Noted weight gain ( 13 pounds in 5 weeks)  Taking lasix 40 mg ( 2 tab 20 mG Qd)  Give additional 20 mg today at noon for 2 days  Monitor BP and weight daily  Contact cardiologist office and inform them of them of weight gain and ask recommendation  Pt could benefit form cardiac rehab  Check BMP today  If chest pain, SOb, orthopnea should go to ER  2. Type 2 diabetes mellitus with complication, with long-term current use of insulin  - Comprehensive metabolic panel; Future  - Lipid panel; Future  - Hemoglobin A1C; Future  - Microalbumin, Random Urine; Future    Should see eye doctor this year discussed - will set appt ASAP per grandson  Lantus refilled 2 days back  Monitor A1c  3. Essential hypertension  - Comprehensive metabolic panel; Future    4. Stage 4 chronic kidney disease  - Comprehensive metabolic panel; Future  Check BMP  Should fu with nephrology  Pt been recommended Dialysis by nephro per grandson      Fasting labs after Aug 30, 2017  Defers Prevnar today  Need fu in 1 week if wt not stable but sooner if s/s    Gracynn Rajewski Sidonie Dickens, MD

## 2017-08-08 ENCOUNTER — Ambulatory Visit (FREE_STANDING_LABORATORY_FACILITY): Payer: Medicare Other | Admitting: Family Medicine

## 2017-08-08 ENCOUNTER — Encounter (INDEPENDENT_AMBULATORY_CARE_PROVIDER_SITE_OTHER): Payer: Self-pay | Admitting: Family Medicine

## 2017-08-08 VITALS — BP 176/77 | HR 83 | Temp 97.8°F | Ht 62.0 in | Wt 169.0 lb

## 2017-08-08 DIAGNOSIS — I130 Hypertensive heart and chronic kidney disease with heart failure and stage 1 through stage 4 chronic kidney disease, or unspecified chronic kidney disease: Secondary | ICD-10-CM

## 2017-08-08 DIAGNOSIS — I5033 Acute on chronic diastolic (congestive) heart failure: Secondary | ICD-10-CM

## 2017-08-08 DIAGNOSIS — E118 Type 2 diabetes mellitus with unspecified complications: Secondary | ICD-10-CM

## 2017-08-08 DIAGNOSIS — I10 Essential (primary) hypertension: Secondary | ICD-10-CM

## 2017-08-08 DIAGNOSIS — Z794 Long term (current) use of insulin: Secondary | ICD-10-CM

## 2017-08-08 DIAGNOSIS — N184 Chronic kidney disease, stage 4 (severe): Secondary | ICD-10-CM

## 2017-08-08 LAB — HEMOLYSIS INDEX: Hemolysis Index: 3 (ref 0–18)

## 2017-08-08 LAB — BASIC METABOLIC PANEL
BUN: 42 mg/dL — ABNORMAL HIGH (ref 9.0–28.0)
CO2: 25 mEq/L (ref 21–29)
Calcium: 9.7 mg/dL (ref 7.9–10.2)
Chloride: 108 mEq/L (ref 100–111)
Creatinine: 2 mg/dL — ABNORMAL HIGH (ref 0.5–1.5)
Glucose: 128 mg/dL — ABNORMAL HIGH (ref 70–100)
Potassium: 4.8 mEq/L (ref 3.5–5.1)
Sodium: 141 mEq/L (ref 136–145)

## 2017-08-08 LAB — GFR: EGFR: 31.6

## 2017-08-08 NOTE — Progress Notes (Signed)
Have you seen any specialists/other providers since your last visit with us?    No    Arm preference verified?   Yes    The patient is due for eye exam and shingles vaccine

## 2017-08-16 ENCOUNTER — Encounter (INDEPENDENT_AMBULATORY_CARE_PROVIDER_SITE_OTHER): Payer: Self-pay | Admitting: Family Medicine

## 2017-08-30 ENCOUNTER — Other Ambulatory Visit: Payer: Medicare Other

## 2017-09-06 ENCOUNTER — Encounter (INDEPENDENT_AMBULATORY_CARE_PROVIDER_SITE_OTHER): Payer: Self-pay | Admitting: Family Medicine

## 2017-09-19 ENCOUNTER — Telehealth (INDEPENDENT_AMBULATORY_CARE_PROVIDER_SITE_OTHER): Payer: Self-pay | Admitting: Family Medicine

## 2017-09-19 NOTE — Telephone Encounter (Signed)
lmtcb x1 

## 2017-09-19 NOTE — Telephone Encounter (Signed)
[facility] prednisone 5 mg tablets, take one every day. I do not see this on medication list to pend.      Last Encounter:  08/08/2017  Next Appointment:  No pending appointments  Last rx- none on file  Last Primary Care Labs: hospital encounter 3/19  Lab Results   Component Value Date    WBC 9.10 06/10/2017    HGB 10.5 (L) 06/10/2017    HCT 31.1 (L) 06/10/2017    PLT 259 06/10/2017    CHOL 113 (L) 05/31/2006    TRIG 125 05/31/2006    LDL 45 (L) 05/31/2006    ALT 19 06/07/2017    AST 45 (H) 06/07/2017    NA 141 08/08/2017    K 4.8 08/08/2017    CL 108 08/08/2017    CREAT 2.0 (H) 08/08/2017    BUN 42.0 (H) 08/08/2017    CO2 25 08/08/2017    TSH 2.54 06/02/2017    PSA 1.0 01/12/2006    INR 1.0 08/13/2016    GLU 128 (H) 08/08/2017    HGBA1C 7.6 (H) 06/02/2017

## 2017-09-19 NOTE — Telephone Encounter (Signed)
Please call family. Pt was not given this med by me. Seems was coming from hospital and nephro. Advise to call prescribing MD.

## 2017-09-20 NOTE — Telephone Encounter (Signed)
lmtcb x2 

## 2017-09-20 NOTE — Telephone Encounter (Signed)
Called and spoke with daughter listed on disclosure Sam Von. Advised to call prescribing MD. She agreed with plan.

## 2017-09-24 ENCOUNTER — Other Ambulatory Visit (INDEPENDENT_AMBULATORY_CARE_PROVIDER_SITE_OTHER): Payer: Self-pay | Admitting: Family Medicine

## 2017-09-24 DIAGNOSIS — I10 Essential (primary) hypertension: Secondary | ICD-10-CM

## 2017-09-24 NOTE — Telephone Encounter (Signed)
LOV 08/08/17    Last Refill 10/01/16 # 9 with 3 RF    Last in house Labs 08/08/17    Next appt none

## 2017-09-25 MED ORDER — HYDRALAZINE HCL 50 MG PO TABS
50.00 mg | ORAL_TABLET | Freq: Every morning | ORAL | 3 refills | Status: DC
Start: 2017-09-25 — End: 2018-01-10

## 2017-09-27 ENCOUNTER — Other Ambulatory Visit (INDEPENDENT_AMBULATORY_CARE_PROVIDER_SITE_OTHER): Payer: Self-pay | Admitting: Family Medicine

## 2017-09-27 DIAGNOSIS — M353 Polymyalgia rheumatica: Secondary | ICD-10-CM

## 2017-09-27 NOTE — Telephone Encounter (Signed)
Last Encounter:  08/08/2017  Next Appointment: None    Last Primary Care Labs:   Lab Results   Component Value Date    WBC 9.10 06/10/2017    HGB 10.5 (L) 06/10/2017    HCT 31.1 (L) 06/10/2017    PLT 259 06/10/2017    CHOL 113 (L) 05/31/2006    TRIG 125 05/31/2006    LDL 45 (L) 05/31/2006    ALT 19 06/07/2017    AST 45 (H) 06/07/2017    NA 141 08/08/2017    K 4.8 08/08/2017    CL 108 08/08/2017    CREAT 2.0 (H) 08/08/2017    BUN 42.0 (H) 08/08/2017    CO2 25 08/08/2017    TSH 2.54 06/02/2017    PSA 1.0 01/12/2006    INR 1.0 08/13/2016    GLU 128 (H) 08/08/2017    HGBA1C 7.6 (H) 06/02/2017       Pls Advise,  Thanks.

## 2017-09-28 NOTE — Telephone Encounter (Signed)
should call ordering MD. This was stated when we got refill last time too!

## 2017-09-30 NOTE — Telephone Encounter (Signed)
LMTCB x 1 

## 2017-10-04 ENCOUNTER — Other Ambulatory Visit (INDEPENDENT_AMBULATORY_CARE_PROVIDER_SITE_OTHER): Payer: Self-pay | Admitting: Family Medicine

## 2017-10-04 DIAGNOSIS — M170 Bilateral primary osteoarthritis of knee: Secondary | ICD-10-CM

## 2017-10-04 DIAGNOSIS — M545 Low back pain, unspecified: Secondary | ICD-10-CM

## 2017-10-04 DIAGNOSIS — K219 Gastro-esophageal reflux disease without esophagitis: Secondary | ICD-10-CM

## 2017-10-04 NOTE — Telephone Encounter (Signed)
LOV 08/08/17    Last Refill gabapentin 04/07/17 # 90 with 5 RF         Pantoprazole 10/01/16 # 90 with 3 RF    Last in house Labs 08/08/17    Next appt none    Pharmacy also requesting a refill of chlorthalidone 50 mg tabs but is no longer on med list D/c'd 06/10/17

## 2017-10-05 MED ORDER — GABAPENTIN 100 MG PO CAPS
ORAL_CAPSULE | ORAL | 0 refills | Status: DC
Start: 2017-10-05 — End: 2017-12-06

## 2017-10-05 MED ORDER — PANTOPRAZOLE SODIUM 40 MG PO TBEC
DELAYED_RELEASE_TABLET | ORAL | 0 refills | Status: DC
Start: 2017-10-05 — End: 2018-01-02

## 2017-10-05 NOTE — Telephone Encounter (Signed)
Call pt and check who gave chorthalidone, should call prescribing doctor. He should check with his nephrologist.

## 2017-10-05 NOTE — Telephone Encounter (Signed)
LMTCB x 1 

## 2017-10-07 NOTE — Telephone Encounter (Signed)
Patient's daughter informed and will ask son who gives patient his medications who prescribes chlorthalidone and ask them to refill it. Also advised her to check with nephrologist.

## 2017-10-11 ENCOUNTER — Encounter (INDEPENDENT_AMBULATORY_CARE_PROVIDER_SITE_OTHER): Payer: Self-pay

## 2017-10-25 ENCOUNTER — Other Ambulatory Visit (INDEPENDENT_AMBULATORY_CARE_PROVIDER_SITE_OTHER): Payer: Self-pay | Admitting: Family Medicine

## 2017-10-25 DIAGNOSIS — M1A9XX0 Chronic gout, unspecified, without tophus (tophi): Secondary | ICD-10-CM

## 2017-10-25 DIAGNOSIS — E119 Type 2 diabetes mellitus without complications: Secondary | ICD-10-CM

## 2017-10-25 DIAGNOSIS — E782 Mixed hyperlipidemia: Secondary | ICD-10-CM

## 2017-10-25 NOTE — Telephone Encounter (Signed)
LOV 08/08/17    Last Refill 10/01/16 # 35 with 3 RF    Last in house Labs 08/08/17    Next appt none

## 2017-10-26 MED ORDER — ATORVASTATIN CALCIUM 20 MG PO TABS
20.00 mg | ORAL_TABLET | Freq: Every day | ORAL | 0 refills | Status: DC
Start: 2017-10-26 — End: 2018-01-09

## 2017-10-26 MED ORDER — LINAGLIPTIN 5 MG PO TABS
5.00 mg | ORAL_TABLET | Freq: Every day | ORAL | 0 refills | Status: DC
Start: 2017-10-26 — End: 2018-02-01

## 2017-10-26 MED ORDER — ALLOPURINOL 100 MG PO TABS
100.00 mg | ORAL_TABLET | Freq: Every day | ORAL | 3 refills | Status: AC
Start: 2017-10-26 — End: ?

## 2017-10-26 NOTE — Telephone Encounter (Signed)
Please help patient schedule appointment

## 2017-10-26 NOTE — Telephone Encounter (Signed)
Refilled. Need fasting lipid panel soon

## 2017-11-03 ENCOUNTER — Other Ambulatory Visit (INDEPENDENT_AMBULATORY_CARE_PROVIDER_SITE_OTHER): Payer: Self-pay | Admitting: Family Medicine

## 2017-11-03 DIAGNOSIS — I10 Essential (primary) hypertension: Secondary | ICD-10-CM

## 2017-11-03 DIAGNOSIS — I5022 Chronic systolic (congestive) heart failure: Secondary | ICD-10-CM

## 2017-11-03 NOTE — Telephone Encounter (Signed)
Last Encounter:  6/19  Next Appointment:  None pending  Last rx- metoprolol listed as historical, lasix 3/19  Last Primary Care Labs:   Lab Results   Component Value Date    WBC 9.10 06/10/2017    HGB 10.5 (L) 06/10/2017    HCT 31.1 (L) 06/10/2017    PLT 259 06/10/2017    CHOL 113 (L) 05/31/2006    TRIG 125 05/31/2006    LDL 45 (L) 05/31/2006    ALT 19 06/07/2017    AST 45 (H) 06/07/2017    NA 141 08/08/2017    K 4.8 08/08/2017    CL 108 08/08/2017    CREAT 2.0 (H) 08/08/2017    BUN 42.0 (H) 08/08/2017    CO2 25 08/08/2017    TSH 2.54 06/02/2017    PSA 1.0 01/12/2006    INR 1.0 08/13/2016    GLU 128 (H) 08/08/2017    HGBA1C 7.6 (H) 06/02/2017

## 2017-11-04 MED ORDER — FUROSEMIDE 20 MG PO TABS
40.00 mg | ORAL_TABLET | Freq: Every day | ORAL | 3 refills | Status: DC
Start: 2017-11-04 — End: 2018-01-09

## 2017-11-04 NOTE — Telephone Encounter (Addendum)
Please check if taking metoprolol and confirm dose.

## 2017-11-07 ENCOUNTER — Telehealth (INDEPENDENT_AMBULATORY_CARE_PROVIDER_SITE_OTHER): Payer: Self-pay | Admitting: Family Medicine

## 2017-11-07 ENCOUNTER — Other Ambulatory Visit (INDEPENDENT_AMBULATORY_CARE_PROVIDER_SITE_OTHER): Payer: Self-pay | Admitting: Family Medicine

## 2017-11-07 DIAGNOSIS — E119 Type 2 diabetes mellitus without complications: Secondary | ICD-10-CM

## 2017-11-07 DIAGNOSIS — Z794 Long term (current) use of insulin: Secondary | ICD-10-CM

## 2017-11-07 MED ORDER — METOPROLOL SUCCINATE ER 25 MG PO TB24
25.00 mg | ORAL_TABLET | Freq: Every day | ORAL | 1 refills | Status: DC
Start: 2017-11-07 — End: 2018-01-10

## 2017-11-07 NOTE — Telephone Encounter (Signed)
Grandson stated patient is taking metoprolol 25 mg daily. Please send to pharmacy.

## 2017-11-07 NOTE — Telephone Encounter (Signed)
See refill encounter

## 2017-11-07 NOTE — Telephone Encounter (Signed)
Refill sent.

## 2017-11-07 NOTE — Addendum Note (Signed)
Addended by: Ernest Mallick on: 11/07/2017 02:43 PM     Modules accepted: Orders

## 2017-11-07 NOTE — Telephone Encounter (Signed)
Name, strength, directions of requested refill(s):    insulin glargine (LANTUS SOLOSTAR) 100 UNIT/ML injection pen (Order [id number])     Insulin Pen Needle 31G X 5 MM Misc (Order [id number])     **Pt is completley out of the medications above**    Pharmacy to send refill to or patient to pick up rx from office (mark requested pharmacy in BOLD):      [id number] - [place], [place]  [address]  [place] [address]  Phone: [phone] Fax: [phone]        Please mark "X" next to the preferred call back number:    Mobile:   Telephone Information:   Mobile [phone]    X   Home: [phone]    Work:           Next visit: Visit date not found

## 2017-11-07 NOTE — Telephone Encounter (Signed)
Patient states he is completely out of this medication. Please send to pharmacy. Thanks!

## 2017-11-08 MED ORDER — INSULIN PEN NEEDLE 31G X 5 MM MISC
1 refills | Status: DC
Start: 2017-11-08 — End: 2017-12-01

## 2017-11-08 MED ORDER — LANTUS SOLOSTAR 100 UNIT/ML SC SOPN
20.00 [IU] | PEN_INJECTOR | Freq: Every evening | SUBCUTANEOUS | 3 refills | Status: DC
Start: 2017-11-08 — End: 2017-12-08

## 2017-11-11 ENCOUNTER — Encounter (INDEPENDENT_AMBULATORY_CARE_PROVIDER_SITE_OTHER): Payer: Self-pay

## 2017-11-29 ENCOUNTER — Ambulatory Visit (INDEPENDENT_AMBULATORY_CARE_PROVIDER_SITE_OTHER): Payer: Medicare Other | Admitting: Family Medicine

## 2017-12-01 ENCOUNTER — Emergency Department: Payer: Medicare Other

## 2017-12-01 ENCOUNTER — Inpatient Hospital Stay
Admission: EM | Admit: 2017-12-01 | Discharge: 2017-12-08 | DRG: 291 | Disposition: A | Discharge status: Home Health | Payer: Medicare Other | Attending: Internal Medicine | Admitting: Internal Medicine

## 2017-12-01 DIAGNOSIS — I150 Renovascular hypertension: Secondary | ICD-10-CM | POA: Diagnosis present

## 2017-12-01 DIAGNOSIS — IMO0001 Reserved for inherently not codable concepts without codable children: Secondary | ICD-10-CM

## 2017-12-01 DIAGNOSIS — M1711 Unilateral primary osteoarthritis, right knee: Secondary | ICD-10-CM

## 2017-12-01 DIAGNOSIS — E782 Mixed hyperlipidemia: Secondary | ICD-10-CM | POA: Diagnosis present

## 2017-12-01 DIAGNOSIS — J986 Disorders of diaphragm: Secondary | ICD-10-CM | POA: Diagnosis present

## 2017-12-01 DIAGNOSIS — Z8249 Family history of ischemic heart disease and other diseases of the circulatory system: Secondary | ICD-10-CM

## 2017-12-01 DIAGNOSIS — D649 Anemia, unspecified: Secondary | ICD-10-CM | POA: Diagnosis present

## 2017-12-01 DIAGNOSIS — I10 Essential (primary) hypertension: Secondary | ICD-10-CM | POA: Diagnosis present

## 2017-12-01 DIAGNOSIS — I252 Old myocardial infarction: Secondary | ICD-10-CM

## 2017-12-01 DIAGNOSIS — R0602 Shortness of breath: Secondary | ICD-10-CM

## 2017-12-01 DIAGNOSIS — N184 Chronic kidney disease, stage 4 (severe): Secondary | ICD-10-CM | POA: Diagnosis present

## 2017-12-01 DIAGNOSIS — N179 Acute kidney failure, unspecified: Secondary | ICD-10-CM | POA: Diagnosis present

## 2017-12-01 DIAGNOSIS — I130 Hypertensive heart and chronic kidney disease with heart failure and stage 1 through stage 4 chronic kidney disease, or unspecified chronic kidney disease: Principal | ICD-10-CM

## 2017-12-01 DIAGNOSIS — N183 Chronic kidney disease, stage 3 unspecified: Secondary | ICD-10-CM | POA: Diagnosis present

## 2017-12-01 DIAGNOSIS — E872 Acidosis: Secondary | ICD-10-CM | POA: Diagnosis present

## 2017-12-01 DIAGNOSIS — M1A9XX0 Chronic gout, unspecified, without tophus (tophi): Secondary | ICD-10-CM | POA: Diagnosis present

## 2017-12-01 DIAGNOSIS — J208 Acute bronchitis due to other specified organisms: Secondary | ICD-10-CM | POA: Diagnosis present

## 2017-12-01 DIAGNOSIS — E871 Hypo-osmolality and hyponatremia: Secondary | ICD-10-CM | POA: Diagnosis present

## 2017-12-01 DIAGNOSIS — T17990A Other foreign object in respiratory tract, part unspecified in causing asphyxiation, initial encounter: Secondary | ICD-10-CM | POA: Diagnosis not present

## 2017-12-01 DIAGNOSIS — M109 Gout, unspecified: Secondary | ICD-10-CM | POA: Diagnosis present

## 2017-12-01 DIAGNOSIS — Z7952 Long term (current) use of systemic steroids: Secondary | ICD-10-CM

## 2017-12-01 DIAGNOSIS — I5033 Acute on chronic diastolic (congestive) heart failure: Secondary | ICD-10-CM | POA: Diagnosis present

## 2017-12-01 DIAGNOSIS — I509 Heart failure, unspecified: Secondary | ICD-10-CM | POA: Diagnosis present

## 2017-12-01 DIAGNOSIS — R32 Unspecified urinary incontinence: Secondary | ICD-10-CM | POA: Diagnosis present

## 2017-12-01 DIAGNOSIS — E1122 Type 2 diabetes mellitus with diabetic chronic kidney disease: Secondary | ICD-10-CM | POA: Diagnosis present

## 2017-12-01 DIAGNOSIS — Z87891 Personal history of nicotine dependence: Secondary | ICD-10-CM

## 2017-12-01 DIAGNOSIS — I5023 Acute on chronic systolic (congestive) heart failure: Secondary | ICD-10-CM | POA: Diagnosis present

## 2017-12-01 DIAGNOSIS — Z79899 Other long term (current) drug therapy: Secondary | ICD-10-CM

## 2017-12-01 DIAGNOSIS — Z794 Long term (current) use of insulin: Secondary | ICD-10-CM

## 2017-12-01 DIAGNOSIS — M353 Polymyalgia rheumatica: Secondary | ICD-10-CM | POA: Diagnosis present

## 2017-12-01 DIAGNOSIS — M170 Bilateral primary osteoarthritis of knee: Secondary | ICD-10-CM | POA: Diagnosis present

## 2017-12-01 DIAGNOSIS — Z9115 Patient's noncompliance with renal dialysis: Secondary | ICD-10-CM

## 2017-12-01 DIAGNOSIS — Z950 Presence of cardiac pacemaker: Secondary | ICD-10-CM

## 2017-12-01 DIAGNOSIS — J40 Bronchitis, not specified as acute or chronic: Secondary | ICD-10-CM | POA: Diagnosis present

## 2017-12-01 LAB — GLUCOSE WHOLE BLOOD - POCT: Whole Blood Glucose POCT: 156 mg/dL — ABNORMAL HIGH (ref 70–100)

## 2017-12-01 LAB — CBC AND DIFFERENTIAL
Absolute NRBC: 0 10*3/uL (ref 0.00–0.00)
Basophils Absolute Automated: 0.05 10*3/uL (ref 0.00–0.08)
Basophils Automated: 0.7 %
Eosinophils Absolute Automated: 1.01 10*3/uL — ABNORMAL HIGH (ref 0.00–0.44)
Eosinophils Automated: 14.3 %
Hematocrit: 31.7 % — ABNORMAL LOW (ref 37.6–49.6)
Hgb: 10.5 g/dL — ABNORMAL LOW (ref 12.5–17.1)
Immature Granulocytes Absolute: 0.01 10*3/uL (ref 0.00–0.07)
Immature Granulocytes: 0.1 %
Lymphocytes Absolute Automated: 1.89 10*3/uL (ref 0.42–3.22)
Lymphocytes Automated: 26.7 %
MCH: 31.2 pg (ref 25.1–33.5)
MCHC: 33.1 g/dL (ref 31.5–35.8)
MCV: 94.1 fL (ref 78.0–96.0)
MPV: 10.8 fL (ref 8.9–12.5)
Monocytes Absolute Automated: 0.88 10*3/uL — ABNORMAL HIGH (ref 0.21–0.85)
Monocytes: 12.4 %
Neutrophils Absolute: 3.23 10*3/uL (ref 1.10–6.33)
Neutrophils: 45.8 %
Nucleated RBC: 0 /100 WBC (ref 0.0–0.0)
Platelets: 160 10*3/uL (ref 142–346)
RBC: 3.37 10*6/uL — ABNORMAL LOW (ref 4.20–5.90)
RDW: 13 % (ref 11–15)
WBC: 7.07 10*3/uL (ref 3.10–9.50)

## 2017-12-01 LAB — COMPREHENSIVE METABOLIC PANEL
ALT: 6 U/L (ref 0–55)
AST (SGOT): 22 U/L (ref 5–34)
Albumin/Globulin Ratio: 1.9 (ref 0.9–2.2)
Albumin: 4 g/dL (ref 3.5–5.0)
Alkaline Phosphatase: 51 U/L (ref 38–106)
Anion Gap: 8 (ref 5.0–15.0)
BUN: 50.7 mg/dL — ABNORMAL HIGH (ref 9.0–28.0)
Bilirubin, Total: 0.4 mg/dL (ref 0.2–1.2)
CO2: 23 mEq/L (ref 22–29)
Calcium: 9.4 mg/dL (ref 7.9–10.2)
Chloride: 105 mEq/L (ref 100–111)
Creatinine: 2.8 mg/dL — ABNORMAL HIGH (ref 0.7–1.3)
Globulin: 2.1 g/dL (ref 2.0–3.6)
Glucose: 96 mg/dL (ref 70–100)
Potassium: 5.1 mEq/L (ref 3.5–5.1)
Protein, Total: 6.1 g/dL (ref 6.0–8.3)
Sodium: 136 mEq/L (ref 136–145)

## 2017-12-01 LAB — GFR: EGFR: 21.4

## 2017-12-01 LAB — TROPONIN I
Troponin I: 0.02 ng/mL (ref 0.00–0.09)
Troponin I: 0.03 ng/mL (ref 0.00–0.09)

## 2017-12-01 LAB — PHOSPHORUS: Phosphorus: 4.4 mg/dL (ref 2.3–4.7)

## 2017-12-01 LAB — MAGNESIUM: Magnesium: 2 mg/dL (ref 1.6–2.6)

## 2017-12-01 LAB — B-TYPE NATRIURETIC PEPTIDE: B-Natriuretic Peptide: 1278.5 pg/mL — ABNORMAL HIGH (ref 0.0–100.0)

## 2017-12-01 MED ORDER — GLUCAGON 1 MG IJ SOLR (WRAP)
1.00 mg | INTRAMUSCULAR | Status: DC | PRN
Start: 2017-12-01 — End: 2017-12-08

## 2017-12-01 MED ORDER — METOPROLOL SUCCINATE ER 25 MG PO TB24
25.00 mg | ORAL_TABLET | Freq: Every day | ORAL | Status: DC
Start: 2017-12-02 — End: 2017-12-08
  Administered 2017-12-02 – 2017-12-07 (×5): 25 mg via ORAL
  Filled 2017-12-01 (×5): qty 1

## 2017-12-01 MED ORDER — FUROSEMIDE 10 MG/ML IJ SOLN
40.00 mg | Freq: Two times a day (BID) | INTRAMUSCULAR | Status: DC
Start: 2017-12-02 — End: 2017-12-03
  Administered 2017-12-02 (×2): 40 mg via INTRAVENOUS
  Filled 2017-12-01 (×2): qty 4

## 2017-12-01 MED ORDER — NIFEDIPINE ER OSMOTIC RELEASE 30 MG PO TB24
120.00 mg | ORAL_TABLET | Freq: Every day | ORAL | Status: DC
Start: 2017-12-02 — End: 2017-12-04
  Administered 2017-12-02: 10:00:00 120 mg via ORAL
  Filled 2017-12-01 (×2): qty 4

## 2017-12-01 MED ORDER — INSULIN LISPRO 100 UNIT/ML SC SOLN
1.00 [IU] | Freq: Three times a day (TID) | SUBCUTANEOUS | Status: DC
Start: 2017-12-02 — End: 2017-12-08
  Administered 2017-12-02: 13:00:00 2 [IU] via SUBCUTANEOUS
  Administered 2017-12-02: 18:00:00 3 [IU] via SUBCUTANEOUS
  Administered 2017-12-02 – 2017-12-03 (×3): 2 [IU] via SUBCUTANEOUS
  Administered 2017-12-03 – 2017-12-04 (×2): 3 [IU] via SUBCUTANEOUS
  Administered 2017-12-04 – 2017-12-05 (×2): 2 [IU] via SUBCUTANEOUS
  Administered 2017-12-05: 12:00:00 1 [IU] via SUBCUTANEOUS
  Filled 2017-12-01: qty 3
  Filled 2017-12-01: qty 6
  Filled 2017-12-01: qty 9
  Filled 2017-12-01 (×2): qty 6
  Filled 2017-12-01 (×2): qty 9
  Filled 2017-12-01 (×2): qty 6

## 2017-12-01 MED ORDER — NALOXONE HCL 0.4 MG/ML IJ SOLN (WRAP)
0.20 mg | INTRAMUSCULAR | Status: DC | PRN
Start: 2017-12-01 — End: 2017-12-08

## 2017-12-01 MED ORDER — PANTOPRAZOLE SODIUM 40 MG PO TBEC
40.00 mg | DELAYED_RELEASE_TABLET | Freq: Every morning | ORAL | Status: DC
Start: 2017-12-02 — End: 2017-12-08
  Administered 2017-12-02 – 2017-12-08 (×6): 40 mg via ORAL
  Filled 2017-12-01 (×6): qty 1

## 2017-12-01 MED ORDER — ALLOPURINOL 100 MG PO TABS
100.00 mg | ORAL_TABLET | Freq: Every day | ORAL | Status: DC
Start: 2017-12-02 — End: 2017-12-08
  Administered 2017-12-02 – 2017-12-08 (×6): 100 mg via ORAL
  Filled 2017-12-01 (×6): qty 1

## 2017-12-01 MED ORDER — ENOXAPARIN SODIUM 30 MG/0.3ML SC SOLN
30.00 mg | Freq: Every day | SUBCUTANEOUS | Status: DC
Start: 2017-12-02 — End: 2017-12-03
  Administered 2017-12-02: 10:00:00 30 mg via SUBCUTANEOUS
  Filled 2017-12-01 (×2): qty 0.3

## 2017-12-01 MED ORDER — FUROSEMIDE 10 MG/ML IJ SOLN
40.00 mg | Freq: Once | INTRAMUSCULAR | Status: AC
Start: 2017-12-01 — End: 2017-12-01
  Administered 2017-12-01: 21:00:00 40 mg via INTRAVENOUS
  Filled 2017-12-01: qty 4

## 2017-12-01 MED ORDER — GABAPENTIN 100 MG PO CAPS
100.00 mg | ORAL_CAPSULE | Freq: Three times a day (TID) | ORAL | Status: DC
Start: 2017-12-01 — End: 2017-12-03
  Administered 2017-12-01 – 2017-12-02 (×4): 100 mg via ORAL
  Filled 2017-12-01 (×5): qty 1

## 2017-12-01 MED ORDER — GLUCOSE 40 % PO GEL
15.00 g | ORAL | Status: DC | PRN
Start: 2017-12-01 — End: 2017-12-08

## 2017-12-01 MED ORDER — MORPHINE SULFATE 2 MG/ML IJ/IV SOLN (WRAP)
2.0000 mg | Freq: Once | Status: AC
Start: 2017-12-01 — End: 2017-12-01
  Administered 2017-12-01: 18:00:00 2 mg via INTRAVENOUS
  Filled 2017-12-01: qty 1

## 2017-12-01 MED ORDER — INSULIN LISPRO 100 UNIT/ML SC SOLN
1.00 [IU] | Freq: Every evening | SUBCUTANEOUS | Status: DC
Start: 2017-12-01 — End: 2017-12-08
  Administered 2017-12-02 – 2017-12-04 (×3): 2 [IU] via SUBCUTANEOUS
  Administered 2017-12-06: 21:00:00 1 [IU] via SUBCUTANEOUS
  Filled 2017-12-01: qty 3
  Filled 2017-12-01 (×2): qty 6
  Filled 2017-12-01: qty 3

## 2017-12-01 MED ORDER — GUAIFENESIN 100 MG/5ML PO SOLN
200.00 mg | Freq: Four times a day (QID) | ORAL | Status: DC | PRN
Start: 2017-12-01 — End: 2017-12-08
  Administered 2017-12-01 – 2017-12-04 (×5): 200 mg via ORAL
  Filled 2017-12-01 (×5): qty 10

## 2017-12-01 MED ORDER — NIFEDIPINE ER OSMOTIC RELEASE 30 MG PO TB24
60.00 mg | ORAL_TABLET | Freq: Every day | ORAL | Status: DC
Start: 2017-12-02 — End: 2017-12-01

## 2017-12-01 MED ORDER — CYCLOBENZAPRINE HCL 10 MG PO TABS
10.00 mg | ORAL_TABLET | Freq: Three times a day (TID) | ORAL | Status: DC | PRN
Start: 2017-12-01 — End: 2017-12-03

## 2017-12-01 MED ORDER — METHYLPREDNISOLONE SODIUM SUCC 40 MG IJ SOLR
40.00 mg | Freq: Three times a day (TID) | INTRAMUSCULAR | Status: DC
Start: 2017-12-01 — End: 2017-12-02
  Administered 2017-12-01: 23:00:00 40 mg via INTRAVENOUS
  Filled 2017-12-01 (×2): qty 1

## 2017-12-01 MED ORDER — ATORVASTATIN CALCIUM 20 MG PO TABS
20.00 mg | ORAL_TABLET | Freq: Every day | ORAL | Status: DC
Start: 2017-12-02 — End: 2017-12-08
  Administered 2017-12-02 – 2017-12-08 (×6): 20 mg via ORAL
  Filled 2017-12-01 (×6): qty 1

## 2017-12-01 MED ORDER — INSULIN GLARGINE 100 UNIT/ML SC SOLN
20.00 [IU] | Freq: Every evening | SUBCUTANEOUS | Status: DC
Start: 2017-12-01 — End: 2017-12-01

## 2017-12-01 MED ORDER — DEXTROSE 50 % IV SOLN
12.50 g | INTRAVENOUS | Status: DC | PRN
Start: 2017-12-01 — End: 2017-12-08

## 2017-12-01 MED ORDER — VALSARTAN 80 MG PO TABS
80.00 mg | ORAL_TABLET | Freq: Every day | ORAL | Status: DC
Start: 2017-12-02 — End: 2017-12-03
  Administered 2017-12-02: 10:00:00 80 mg via ORAL
  Filled 2017-12-01 (×2): qty 1

## 2017-12-01 MED ORDER — ASPIRIN 81 MG PO CHEW
81.00 mg | CHEWABLE_TABLET | Freq: Every day | ORAL | Status: DC
Start: 2017-12-02 — End: 2017-12-08
  Administered 2017-12-02 – 2017-12-08 (×6): 81 mg via ORAL
  Filled 2017-12-01 (×6): qty 1

## 2017-12-01 MED ORDER — DOCUSATE SODIUM 100 MG PO CAPS
100.00 mg | ORAL_CAPSULE | Freq: Two times a day (BID) | ORAL | Status: DC
Start: 2017-12-01 — End: 2017-12-02
  Administered 2017-12-01 – 2017-12-02 (×2): 100 mg via ORAL
  Filled 2017-12-01 (×2): qty 1

## 2017-12-01 MED ORDER — IPRATROPIUM BROMIDE 0.02 % IN SOLN
0.50 mg | Freq: Once | RESPIRATORY_TRACT | Status: AC
Start: 2017-12-01 — End: 2017-12-01
  Administered 2017-12-01: 18:00:00 0.5 mg via RESPIRATORY_TRACT
  Filled 2017-12-01: qty 2.5

## 2017-12-01 MED ORDER — ONDANSETRON HCL 4 MG/2ML IJ SOLN
4.00 mg | Freq: Once | INTRAMUSCULAR | Status: AC
Start: 2017-12-01 — End: 2017-12-01
  Administered 2017-12-01: 18:00:00 4 mg via INTRAVENOUS
  Filled 2017-12-01: qty 2

## 2017-12-01 MED ORDER — ALBUTEROL-IPRATROPIUM 2.5-0.5 (3) MG/3ML IN SOLN
3.00 mL | Freq: Four times a day (QID) | RESPIRATORY_TRACT | Status: DC
Start: 2017-12-01 — End: 2017-12-03
  Administered 2017-12-02 – 2017-12-03 (×4): 3 mL via RESPIRATORY_TRACT
  Filled 2017-12-01 (×4): qty 3

## 2017-12-01 MED ORDER — INSULIN GLARGINE 100 UNIT/ML SC SOLN
16.00 [IU] | Freq: Every evening | SUBCUTANEOUS | Status: DC
Start: 2017-12-02 — End: 2017-12-06
  Administered 2017-12-02 – 2017-12-05 (×4): 16 [IU] via SUBCUTANEOUS
  Filled 2017-12-01 (×4): qty 16

## 2017-12-01 MED ORDER — ALBUTEROL SULFATE (2.5 MG/3ML) 0.083% IN NEBU
2.50 mg | INHALATION_SOLUTION | Freq: Once | RESPIRATORY_TRACT | Status: AC
Start: 2017-12-01 — End: 2017-12-01
  Administered 2017-12-01: 18:00:00 2.5 mg via RESPIRATORY_TRACT
  Filled 2017-12-01: qty 3

## 2017-12-01 NOTE — ED Notes (Signed)
Telemetry Communication  Date: 12/01/2017 Time: 10:05 PM    [phone]     ED or sending unit RN Phone [phone]    Assigned [id number]      Receiving Unit PCU    Receiving RN     Receiving RN  Phone #       Code Status Full code    Pacemaker Yes      ~~~~~~~~~~~~~~~   Team RN Signature: Rae Halsted

## 2017-12-01 NOTE — UM Notes (Signed)
ER review     84y/o male came into ER on 8/29 and admitted on same day for CHF     Pnt with increasing shortness of breath over the past 2 weeks.  Cough is productive of white phlegm.  He was brought in by EMS and given a nebulizer in route.  He also complains of right knee pain that rates radiates to his right hip.     VS: 99.9; p80; 97%RA; r19; 164/68; 10/10 pain     ER meds  Neb albuteral   Neb atrovent   IV morphine   IV zofran     Abn Labs  H/h 10.5/31.7   Rbc 3.37   Bun 50.7   Creat 2.8   bnp 1278.5   CXR: Cardiomegaly with mild prominence of central vasculature may represent  mild central vascular congestion. Elevated right hemidiaphragm with mild  basal atelectasis. No focal consolidation.    XR knee right:      Knee effusion. No fracture seen. Degenerative changes.        Hip right ap/lat w pelvis: no acute abn Mild spurring right superior acetabulum    Ventricular-paced rhythm WITH PREMATURE VENTRICULAR OR ABERRANTLY CONDUCTED COMPLEXES  ABNORMAL ECG  WHEN COMPARED WITH ECG OF 03-Jun-2017 08:36,  NO SIGNIFICANT CHANGE WAS FOUND

## 2017-12-01 NOTE — ED Provider Notes (Signed)
Physician/Midlevel provider first contact with patient: 12/01/17 1655         History     Chief Complaint   Patient presents with   . Shortness of Breath   . Knee Pain     84 year old male presents with increasing shortness of breath over the past 2 weeks.  Cough is productive of white phlegm.  He was brought in by EMS and given a nebulizer in route.  He also complains of right knee pain that rates radiates to his right hip.  No fall or injury.  No recent travel.  He has a history of heart failure, renal failure, degenerative joint disease, uncontrolled hypertension and second-degree heart block.  Patient has a pacemaker.  Pain is moderate to severe.    Patient is worse with movement and improves at rest.                 Past Medical History:   Diagnosis Date   . Acute diastolic heart failure 76/73/4193   . Acute on chronic diastolic congestive heart failure 08/13/2016   . Acute pulmonary edema 05/26/2015   . ARF (acute renal failure) 05/26/2015   . Chronic diastolic heart failure 79/05/4095   . Chronic gout    . Chronic pain syndrome 07/07/2015   . CKD (chronic kidney disease) stage 4, GFR 15-29 ml/min     2018 baseline creatinine in EPIC is 1.5-2.3.   . Congestive heart failure (CHF)     EF 40% 5/18 echo, on lasix   . Current chronic use of systemic steroids     For Polymyalgia rheumatica   . Essential hypertension    . Mixed hyperlipidemia    . NSTEMI (non-ST elevated myocardial infarction) 08/13/2016   . Osteoarthritis of both knees    . Peripheral edema 06/16/2015   . Pneumonia due to infectious organism, unspecified laterality, unspecified part of lung 06/25/2015   . Polymyalgia rheumatica     chronic steroids   . 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 Second degree heart block by electrocardiogram (ECG) 04/27/2015   . Type 2 diabetes mellitus with complication, with long-term current use of insulin     12/01/2016 Hemoglobin A1c 8.2%       Past Surgical History:   Procedure Laterality Date   . ABDOMINAL SURGERY      . CHOLECYSTECTOMY     . PACEMAKER Left 2017       Family History   Problem Relation Age of Onset   . Hypertension Father        Social  Social History   Substance Use Topics   . Smoking status: Never Smoker   . Smokeless tobacco: Never Used   . Alcohol use No      Comment: former alcoholic       .     No Known Allergies    Home Medications     Med List Status:  Complete Set By: Tama Gander, RN at 12/01/2017 11:54 PM                acetaminophen (TYLENOL) 325 MG tablet     Take 2 tablets (650 mg total) by mouth every 4 (four) hours as needed for Fever (Temperature greater than 38 C/100.4 F).     allopurinol (ZYLOPRIM) 100 MG tablet     Take 1 tablet (100 mg total) by mouth daily     atorvastatin (LIPITOR) 20 MG tablet     Take 1 tablet (20 mg total)  by mouth daily     cetirizine (ZYRTEC) 10 MG tablet     Take 1 tablet (10 mg total) by mouth daily as needed for Rhinitis.     docusate sodium (COLACE) 100 MG capsule     Take 100 mg by mouth 2 (two) times daily.         furosemide (LASIX) 20 MG tablet     Take 2 tablets (40 mg total) by mouth daily     gabapentin (NEURONTIN) 100 MG capsule     TAKE ONE CAPSULE BY MOUTH EVERY 8 HOURS     hydrALAZINE (APRESOLINE) 50 MG tablet     Take 1 tablet (50 mg total) by mouth every morning     insulin glargine (LANTUS SOLOSTAR) 100 UNIT/ML injection pen     Inject 20 Units into the skin nightly     linaGLIPtin 5 MG Tab     Take 1 tablet (5 mg total) by mouth daily     metoprolol succinate XL (TOPROL-XL) 25 MG 24 hr tablet     Take 1 tablet (25 mg total) by mouth daily     NIFEdipine (PROCARDIA XL) 60 MG 24 hr tablet     TAKE 2 TABLETS BY MOUTH Every morning.     pantoprazole (PROTONIX) 40 MG tablet     TAKE ONE TABLET BY MOUTH EVERY DAY     Probiotic Product (RISAQUAD PO)     Take 1 capsule by mouth daily.     valsartan (DIOVAN) 80 MG tablet     Take 1 tablet (80 mg total) by mouth daily.           Review of Systems   Constitutional: Negative for diaphoresis and fever.    HENT: Negative for congestion, ear pain and sore throat.    Eyes: Negative for pain and visual disturbance.   Respiratory: Positive for shortness of breath. Negative for cough, chest tightness and wheezing.    Cardiovascular: Negative for chest pain, palpitations and leg swelling.   Gastrointestinal: Negative for abdominal pain, constipation, diarrhea, nausea and vomiting.   Genitourinary: Negative for dysuria and urgency.   Musculoskeletal: Negative for back pain, gait problem and neck pain.        Right hip and knee pain.   Skin: Negative for color change and rash.   Neurological: Negative for dizziness, weakness and headaches.   Psychiatric/Behavioral: Negative for confusion. The patient is not nervous/anxious.        Physical Exam    BP: 164/68, Heart Rate: 80, Temp: 99.9 F (37.7 C), Resp Rate: 19, SpO2: 97 %, Weight: 80.2 kg    Physical Exam   Constitutional: He is oriented to person, place, and time. He appears well-developed and well-nourished.   HENT:   Head: Normocephalic and atraumatic.   Right Ear: External ear normal.   Left Ear: External ear normal.   Nose: Nose normal.   Mouth/Throat: Oropharynx is clear and moist. No oropharyngeal exudate.   Eyes: Pupils are equal, round, and reactive to light. Conjunctivae and EOM are normal. Right eye exhibits no discharge. Left eye exhibits no discharge. No scleral icterus.   Neck: Normal range of motion. Neck supple. No JVD present. No tracheal deviation present.   Cardiovascular: Normal rate, regular rhythm, normal heart sounds and intact distal pulses.    No murmur heard.  Pulmonary/Chest: Effort normal. He has decreased breath sounds. He has no wheezes. He exhibits no tenderness.   Abdominal: Soft. Bowel sounds are normal. He  exhibits no distension. There is no tenderness. There is no guarding.   Musculoskeletal: He exhibits no edema.        Right hip: He exhibits tenderness.        Right knee: He exhibits effusion. Tenderness found.   Neurological: He is  alert and oriented to person, place, and time. He has normal strength. Coordination normal. GCS eye subscore is 4. GCS verbal subscore is 5. GCS motor subscore is 6.   Skin: Skin is warm and dry. He is not diaphoretic. No pallor.   Psychiatric: He has a normal mood and affect. His behavior is normal. Judgment normal.   Nursing note and vitals reviewed.        MDM and ED Course     ED Medication Orders     Start Ordered     Status Ordering Provider    12/01/17 1959 12/01/17 1958  furosemide (LASIX) injection 40 mg  Once     Route: Intravenous  Ordered Dose: 40 mg     Last MAR action:  Given Aubryn Spinola    12/01/17 1727 12/01/17 1726  morphine injection 2 mg  Once     Route: Intravenous  Ordered Dose: 2 mg     Last MAR action:  Given Kaelen Brennan    12/01/17 1727 12/01/17 1726  ondansetron (ZOFRAN) injection 4 mg  Once     Route: Intravenous  Ordered Dose: 4 mg     Last MAR action:  Given Lylith Bebeau    12/01/17 1717 12/01/17 1716  albuterol (PROVENTIL) (2.5 MG/3ML) 0.083% nebulizer solution 2.5 mg  RT - Once     Route: Nebulization  Ordered Dose: 2.5 mg     Last MAR action:  Given Yamna Mackel    12/01/17 1717 12/01/17 1716  ipratropium (ATROVENT) 0.02 % nebulizer solution 0.5 mg  RT - Once     Route: Nebulization  Ordered Dose: 0.5 mg     Last MAR action:  Given Lynnlee Revels             MDM  Number of Diagnoses or Management Options  Acute on chronic congestive heart failure, unspecified heart failure type:   Acute renal failure superimposed on stage 4 chronic kidney disease, unspecified acute renal failure type:   Primary osteoarthritis of right knee:   Shortness of breath:   Diagnosis management comments: Oxygen saturation by pulse oximetry is 91%-94%, Low Normal.  Interventions: observation.      EKG Interpretation    EKG interpreted by Tollie Pizza, DO  Time: 1735  Rate: Normal  Rhythm: Ventricular paced rhythm  Axis: Left  ST Segments ant T waves: No acute ST-T wave changes.  Conduction: Paced rhythm  Impression:  Non-specific EKG    Diff Dx: pneumonia, asthma, bronchitis, CHF, MI, dehydration.  Rule out fracture right knee and hip also.    Patient given Lasix.  Will need observation.  X-ray shows no fracture but significant degenerative joint disease and effusion.    2030 This patient is at risk for decompensation due to exacerbation of CHF and is at risk of death or permanent injury and will need to be observed in the hospital.    Dr. Normajean Baxter will accept.     I have reviewed the nursing history.    I have reviewed and interpreted all available xrays and find the following results: X-ray right knee shows a knee effusion, x-ray right hip-negative    DR. Tollie Pizza  is the primary attending for  this patient and has obtained and performed the history, PE, and medical decision making for this patient.      Results     Procedure Component Value Units Date/Time    Troponin I ([id number]) Collected:  12/01/17 2304    Specimen:  Blood Updated:  12/01/17 2354     Troponin I 0.03 ng/mL     Glucose Whole Blood - POCT ([id number])  (Abnormal) Collected:  12/01/17   2315     Updated:  12/01/17 2317     POCT - Glucose Whole blood 156 (H) mg/dL     B-type Natriuretic Peptide ([id number])  (Abnormal) Collected:  12/01/17   1732    Specimen:  Blood Updated:  12/01/17 1955     B-Natriuretic Peptide 1,278.5 (H) pg/mL     Troponin I ([id number]) Collected:  12/01/17 1732    Specimen:  Blood Updated:  12/01/17 1802     Troponin I 0.02 ng/mL     Comprehensive metabolic panel ([id number])  (Abnormal) Collected:    12/01/17 1733    Specimen:  Blood Updated:  12/01/17 1757     Glucose 96 mg/dL      BUN 50.7 (H) mg/dL      Creatinine 2.8 (H) mg/dL      Sodium 136 mEq/L      Potassium 5.1 mEq/L      Chloride 105 mEq/L      CO2 23 mEq/L      Calcium 9.4 mg/dL      Protein, Total 6.1 g/dL      Albumin 4.0 g/dL      AST (SGOT) 22 U/L      ALT 6 U/L      Alkaline Phosphatase 51 U/L      Bilirubin, Total 0.4 mg/dL      Globulin 2.1 g/dL      Albumin/Globulin  Ratio 1.9     Anion Gap 8.0    Phosphorus ([id number]) Collected:  12/01/17 1733    Specimen:  Blood Updated:  12/01/17 1757     Phosphorus 4.4 mg/dL     Magnesium ([id number]) Collected:  12/01/17 1733    Specimen:  Blood Updated:  12/01/17 1757     Magnesium 2.0 mg/dL     GFR ([id number]) Collected:  12/01/17 1733     Updated:  12/01/17 1757     EGFR 21.4    CBC with differential ([id number])  (Abnormal) Collected:  12/01/17 1733    Specimen:  Blood from Blood Updated:  12/01/17 1742     WBC 7.07 x10 3/uL      Hgb 10.5 (L) g/dL      Hematocrit 31.7 (L) %      Platelets 160 x10 3/uL      RBC 3.37 (L) x10 6/uL      MCV 94.1 fL      MCH 31.2 pg      MCHC 33.1 g/dL      RDW 13 %      MPV 10.8 fL      Neutrophils 45.8 %      Lymphocytes Automated 26.7 %      Monocytes 12.4 %      Eosinophils Automated 14.3 %      Basophils Automated 0.7 %      Immature Granulocyte 0.1 %      Nucleated RBC 0.0 /100 WBC      Neutrophils Absolute 3.23 x10 3/uL      Abs Lymph Automated 1.89 x10 3/uL  Abs Mono Automated 0.88 (H) x10 3/uL      Abs Eos Automated 1.01 (H) x10 3/uL      Absolute Baso Automated 0.05 x10 3/uL      Absolute Immature Granulocyte 0.01 x10 3/uL      Absolute NRBC 0.00 x10 3/uL         Radiology Results (24 Hour)     Procedure Component Value Units Date/Time    XR Chest  AP Portable ([id number]) Collected:  12/01/17 1931    Order Status:  Completed Updated:  12/01/17 1935    Narrative:       HISTORY: Cough.    COMMENT:  Frontal view of the chest was obtained and compared to 06/05/2017.    There is elevated right hemidiaphragm with mild basal atelectasis. No  focal consolidation or pleural effusions. Mild prominence of central  vasculature. Stable cardiomegaly. Left-sided transvenous pacer remains  in place.      Impression:         Cardiomegaly with mild prominence of central vasculature may represent  mild central vascular congestion. Elevated right hemidiaphragm with mild  basal atelectasis. No focal  consolidation.    Donnetta Hail, MD   12/01/2017 7:31 PM    Hip Right AP and Lateral with Pelvis ([id number]) Collected:  12/01/17   1926    Order Status:  Completed Updated:  12/01/17 1935    Narrative:       History: Pain.    AP view of pelvis and frog-leg lateral view right hip were obtained and  compared to 06/02/2017. There is no fracture or dislocation. Mild  spurring right superior acetabulum. Soft tissue density projecting over  right lateral thigh unchanged. Vascular calcifications.      Impression:         No fracture seen. Mild spurring right superior acetabulum.    Donnetta Hail, MD   12/01/2017 7:31 PM    XR Knee 1 Or 2 Views Right ([id number]) Collected:  12/01/17 1928    Order Status:  Completed Updated:  12/01/17 1933    Narrative:       History: Pain.    2 views of right knee were obtained and compared to 05/26/2016.    There is no fracture or dislocation. There is knee effusion.  Degenerative changes severe bilateral medial compartment. Vascular  calcifications.      Impression:         Knee effusion. No fracture seen. Degenerative changes.    Donnetta Hail, MD   12/01/2017 7:29 PM        *This note was generated by the [employer] EMR system/ Dragon speech recognition and may contain inherent errors or omissions not intended by the user. Grammatical errors, random word insertions, deletions, pronoun errors and incomplete sentences are occasional consequences of this technology due to software limitations. Not all errors are caught or corrected. If there are questions or concerns about the content of this note or information contained within the body of this dictation they should be addressed directly with the author for clarification           Amount and/or Complexity of Data Reviewed  Clinical lab tests: ordered and reviewed  Tests in the radiology section of CPT: ordered and reviewed  Tests in the medicine section of CPT: ordered and reviewed                     Procedures    Clinical Impression &  Disposition  Clinical Impression  Final diagnoses:   Shortness of breath   Acute on chronic congestive heart failure, unspecified heart failure type   Acute renal failure superimposed on stage 4 chronic kidney disease, unspecified acute renal failure type   Primary osteoarthritis of right knee        ED Disposition     ED Disposition Condition Date/Time Comment    Observation  Thu Dec 01, 2017  8:37 PM Admitting Physician: Mila Merry [[id number]]   Diagnosis: Acute on chronic congestive heart failure [703609]   Estimated Length of Stay: < 2 midnights   Tentative Discharge Plan?: Home or Self Care [1]   Patient Class: Observation [104]             Current Discharge Medication List                    Tollie Pizza, DO  12/02/17 0014

## 2017-12-01 NOTE — ED Notes (Signed)
Nursing Communication - Admission Information:  Diagnosis: Acute on chronic congestive heart failure  Mobility:up with one person assist  Isolation: No active isolations  Neuro: alert and oriented  Special needs: does not speak [place], [place]ian, family at bedside  Drips:

## 2017-12-01 NOTE — H&P (Signed)
[facility] H&P Note    Patient Info:   Date/Time: 12/01/2017 / 10:55 PM   Admit Date:12/01/2017  Patient Name:Marco Bruce   DEY:[id number]   PCP: Danella Sensing, MD  Attending Physician:Khamila Bassinger, Idamae Lusher, MD  Assessment/ Plan:   1. Acute on chronic congestive heart failure: Patient was given 40 mg of Lasix in the emergency room, he will be placed on observation, continue on IV Lasix, continue his cardiac medications, monitor BNP, consult with his cardiologist.  ([facility]), continue cycling cardiac enzymes to rule out acute coronary artery event.  2. Acute bronchitis: Patient had expiratory wheezings on examination, chest x-ray showed no consolidation, I will start him on Solu-Medrol and DuoNeb breathing treatment.  3. Type 2 diabetes: Well controlled, last hemoglobin A1c was 7.6, continue on Lantus and insulin sliding scale.  4. Chronic systolic congestive heart failure, with an ejection fraction of 40 percent on his last echocardiogram, presented with acute on chronic congestive heart failure exacerbation, continue management as above, continue on his home treatment with hydralazine, Toprol-XL, nifedipine, Diovan, aspirin and Lipitor  5. Chronic kidney disease stage IV: Creatinine 2.8, continue on Lasix, continue close monitoring of kidney function.    DVT Prohylaxis:lovenox and SEDs   Code Status: Full Code  Disposition:home  Type of Admission:Observation  Estimated Length of Stay (including stay in the ER receiving treatment): 1-2 days    Clinical Information and History:   Chief Complaint:  Chief Complaint   Patient presents with   . Shortness of Breath   . Knee Pain     Past Medical History:  Past Medical History:   Diagnosis Date   . Acute diastolic heart failure 63/14/9702   . Acute on chronic diastolic congestive heart failure 08/13/2016   . Acute pulmonary edema 05/26/2015   . ARF (acute renal failure) 05/26/2015   . Chronic diastolic heart failure 63/78/5885   . Chronic gout     . Chronic pain syndrome 07/07/2015   . CKD (chronic kidney disease) stage 4, GFR 15-29 ml/min     2018 baseline creatinine in EPIC is 1.5-2.3.   . Congestive heart failure (CHF)     EF 40% 5/18 echo, on lasix   . Current chronic use of systemic steroids     For Polymyalgia rheumatica   . Essential hypertension    . Mixed hyperlipidemia    . NSTEMI (non-ST elevated myocardial infarction) 08/13/2016   . Osteoarthritis of both knees    . Peripheral edema 06/16/2015   . Pneumonia due to infectious organism, unspecified laterality, unspecified part of lung 06/25/2015   . Polymyalgia rheumatica     chronic steroids   . 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 Second degree heart block by electrocardiogram (ECG) 04/27/2015   . Type 2 diabetes mellitus with complication, with long-term current use of insulin     12/01/2016 Hemoglobin A1c 8.2%     Past Surgical History:  Past Surgical History:   Procedure Laterality Date   . ABDOMINAL SURGERY     . CHOLECYSTECTOMY     . PACEMAKER Left 2017     Family History:  Family History   Problem Relation Age of Onset   . Hypertension Father      Social History:  History   Alcohol Use No     Comment: former alcoholic     History   Drug Use No     History   Smoking Status   . Never Smoker   Smokeless Tobacco   .  Never Used     Social History     Social History   . Marital status: Widowed     Spouse name: N/A   . Number of children: N/A   . Years of education: N/A     Social History Main Topics   . Smoking status: Never Smoker   . Smokeless tobacco: Never Used   . Alcohol use No      Comment: former alcoholic   . Drug use: No   . Sexual activity: Not Asked     Other Topics Concern   . None     Social History Narrative   . None     Allergies:No Known Allergies  Medications:  Prescriptions Prior to Admission   Medication Sig Dispense Refill Last Dose   . acetaminophen (TYLENOL) 325 MG tablet Take 2 tablets (650 mg total) by mouth every 4 (four) hours as needed for Fever (Temperature greater  than 38 C/100.4 F).   Taking   . albuterol (PROAIR HFA) 108 (90 Base) MCG/ACT inhaler Inhale 2 puffs into the lungs every 6 (six) hours as needed for Wheezing. 1 Inhaler 0 Taking   . allopurinol (ZYLOPRIM) 100 MG tablet Take 1 tablet (100 mg total) by mouth daily 90 tablet 3    . atorvastatin (LIPITOR) 20 MG tablet Take 1 tablet (20 mg total) by mouth daily 90 tablet 0    . cetirizine (ZYRTEC) 10 MG tablet Take 1 tablet (10 mg total) by mouth daily as needed for Rhinitis.   Taking   . cyclobenzaprine (FLEXERIL) 10 MG tablet Take 1 tablet (10 mg total) by mouth every 8 (eight) hours as needed for Muscle spasms. 30 tablet 5 Taking   . docusate sodium (COLACE) 100 MG capsule Take 100 mg by mouth 2 (two) times daily.       Taking   . furosemide (LASIX) 20 MG tablet Take 2 tablets (40 mg total) by mouth daily 180 tablet 3    . gabapentin (NEURONTIN) 100 MG capsule TAKE ONE CAPSULE BY MOUTH EVERY 8 HOURS 90 capsule 0    . hydrALAZINE (APRESOLINE) 50 MG tablet Take 1 tablet (50 mg total) by mouth every morning 90 tablet 3    . insulin glargine (LANTUS SOLOSTAR) 100 UNIT/ML injection pen Inject 20 Units into the skin nightly 9 mL 3    . Insulin Pen Needle 31G X 5 MM Misc INJECT INSULIN DAILY AS DIRECTED 100 each 1    . linaGLIPtin 5 MG Tab Take 1 tablet (5 mg total) by mouth daily 90 tablet 0    . magnesium hydroxide (MILK OF MAGNESIA) 400 MG/5ML suspension Take 30 mLs by mouth daily as needed for Constipation. 355 mL 0 Taking   . metoprolol succinate XL (TOPROL-XL) 25 MG 24 hr tablet Take 1 tablet (25 mg total) by mouth daily 90 tablet 1    . NIFEdipine (PROCARDIA XL) 60 MG 24 hr tablet TAKE 2 TABLETS BY MOUTH Every morning. 180 tablet 0 Taking   . pantoprazole (PROTONIX) 40 MG tablet TAKE ONE TABLET BY MOUTH EVERY DAY 90 tablet 0    . Probiotic Product (RISAQUAD PO) Take 1 capsule by mouth daily.   Taking   . valsartan (DIOVAN) 80 MG tablet Take 1 tablet (80 mg total) by mouth daily. 90 tablet 3 Taking     Clinical  Presentation: HPI:   Marco Bruce is a 84 y.o. male who has history of   Past Surgical History:   Procedure Laterality  Date   . ABDOMINAL SURGERY     . CHOLECYSTECTOMY     . PACEMAKER Left 2017      Past Medical History:   Diagnosis Date   . Acute diastolic heart failure 79/15/0569   . Acute on chronic diastolic congestive heart failure 08/13/2016   . Acute pulmonary edema 05/26/2015   . ARF (acute renal failure) 05/26/2015   . Chronic diastolic heart failure 79/48/0165   . Chronic gout    . Chronic pain syndrome 07/07/2015   . CKD (chronic kidney disease) stage 4, GFR 15-29 ml/min     2018 baseline creatinine in EPIC is 1.5-2.3.   . Congestive heart failure (CHF)     EF 40% 5/18 echo, on lasix   . Current chronic use of systemic steroids     For Polymyalgia rheumatica   . Essential hypertension    . Mixed hyperlipidemia    . NSTEMI (non-ST elevated myocardial infarction) 08/13/2016   . Osteoarthritis of both knees    . Peripheral edema 06/16/2015   . Pneumonia due to infectious organism, unspecified laterality, unspecified part of lung 06/25/2015   . Polymyalgia rheumatica     chronic steroids   . 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 Second degree heart block by electrocardiogram (ECG) 04/27/2015   . Type 2 diabetes mellitus with complication, with long-term current use of insulin     12/01/2016 Hemoglobin A1c 8.2%    came with the chief complaint of Shortness of Breath and Knee Pain    This is an 84 year old gentleman with a known history of chronic systolic congestive heart failure with an ejection fraction of 40 percent on his last echocardiogram, with pacemaker placement since November 2017, history of type 2 diabetes, chronic kidney disease, presented to the hospital with increased shortness of breath for 2 weeks, got worse this afternoon.    Patient had been complaining of productive cough with white phlegm, was brought to the emergency room by EMS, after they were called for worsening shortness of breath, he  received a nebulizer treatment on route, he denied a chest pain, no fever but he had chills, no nausea, vomiting or diarrhea, denied any sick contact.    Patient is also complaining of right knee and right hip pain, denied any falls or any injuries, he has a known history of degenerative joint disease.    Review of Systems:   Chief Complaint:  Shortness of Breath and Knee Pain    Review of Systems   Constitutional: Positive for chills. Negative for fever.   HENT: Negative.    Eyes: Negative.    Respiratory: Positive for cough and shortness of breath.    Cardiovascular: Negative for chest pain and leg swelling.   Gastrointestinal: Negative for abdominal pain, diarrhea, heartburn, nausea and vomiting.   Genitourinary: Negative.    Musculoskeletal: Negative.    Skin: Negative.    Neurological: Positive for weakness. Negative for dizziness, tingling, tremors, sensory change and headaches.   Endo/Heme/Allergies: Negative.    Psychiatric/Behavioral: Negative.      Physical Exam:     Vitals:    12/01/17 1830 12/01/17 1900 12/01/17 2000 12/01/17 2247   BP: 132/54 103/50 122/47 134/70   Pulse: 77 80 73 74   Resp: 15 18 18 18    Temp:    98.1 F (36.7 C)   TempSrc:    Oral   SpO2: 100% 93% 93% 96%   Weight:       Height:  Physical Exam:   Physical Exam   Constitutional: He is oriented to person, place, and time. He appears well-developed and well-nourished. No distress.   HENT:   Head: Normocephalic and atraumatic.   Eyes: Pupils are equal, round, and reactive to light.   Neck: Normal range of motion. Neck supple. No JVD present.   Cardiovascular: Normal rate, regular rhythm, normal heart sounds and intact distal pulses.    Pulmonary/Chest: Effort normal. He has wheezes (expiratory). He has no rales. He exhibits no tenderness.   Abdominal: Soft. Bowel sounds are normal. He exhibits no distension and no mass. There is no tenderness. There is no guarding.   Musculoskeletal: Normal range of motion. He exhibits no edema.    Lymphadenopathy:     He has no cervical adenopathy.   Neurological: He is alert and oriented to person, place, and time. He displays normal reflexes. No cranial nerve deficit. Coordination normal.   Skin: Skin is warm.   Psychiatric: He has a normal mood and affect.     Results of Labs/imaging:   Labs have been reviewed:   Coagulation Profile:       CBC review:     Recent Labs  Lab 12/01/17  1733   WBC 7.07   Hgb 10.5*   Hematocrit 31.7*   Platelets 160   MCV 94.1   RDW 13   Neutrophils 45.8   Lymphocytes Automated 26.7   Eosinophils Automated 14.3   Immature Granulocyte 0.1   Neutrophils Absolute 3.23   Absolute Immature Granulocyte 0.01     Chem Review:    Recent Labs  Lab 12/01/17  1733   Sodium 136   Potassium 5.1   Chloride 105   CO2 23   BUN 50.7*   Creatinine 2.8*   Glucose 96   Calcium 9.4   Magnesium 2.0   Phosphorus 4.4   Bilirubin, Total 0.4   AST (SGOT) 22   ALT 6   Alkaline Phosphatase 51     Results     Procedure Component Value Units Date/Time    B-type Natriuretic Peptide [[id number]]  (Abnormal) Collected:  12/01/17 1732    Specimen:  Blood Updated:  12/01/17 1955     B-Natriuretic Peptide 1,278.5 (H) pg/mL     Troponin I [[id number]] Collected:  12/01/17 1732    Specimen:  Blood Updated:  12/01/17 1802     Troponin I 0.02 ng/mL     Comprehensive metabolic panel [[id number]]  (Abnormal) Collected:  12/01/17 1733    Specimen:  Blood Updated:  12/01/17 1757     Glucose 96 mg/dL      BUN 50.7 (H) mg/dL      Creatinine 2.8 (H) mg/dL      Sodium 136 mEq/L      Potassium 5.1 mEq/L      Chloride 105 mEq/L      CO2 23 mEq/L      Calcium 9.4 mg/dL      Protein, Total 6.1 g/dL      Albumin 4.0 g/dL      AST (SGOT) 22 U/L      ALT 6 U/L      Alkaline Phosphatase 51 U/L      Bilirubin, Total 0.4 mg/dL      Globulin 2.1 g/dL      Albumin/Globulin Ratio 1.9     Anion Gap 8.0    Phosphorus [[id number]] Collected:  12/01/17 1733    Specimen:  Blood Updated:  12/01/17 1757  Phosphorus 4.4 mg/dL     Magnesium  [[id number]] Collected:  12/01/17 1733    Specimen:  Blood Updated:  12/01/17 1757     Magnesium 2.0 mg/dL     GFR [[id number]] Collected:  12/01/17 1733     Updated:  12/01/17 1757     EGFR 21.4    CBC with differential [[id number]]  (Abnormal) Collected:  12/01/17 1733    Specimen:  Blood from Blood Updated:  12/01/17 1742     WBC 7.07 x10 3/uL      Hgb 10.5 (L) g/dL      Hematocrit 31.7 (L) %      Platelets 160 x10 3/uL      RBC 3.37 (L) x10 6/uL      MCV 94.1 fL      MCH 31.2 pg      MCHC 33.1 g/dL      RDW 13 %      MPV 10.8 fL      Neutrophils 45.8 %      Lymphocytes Automated 26.7 %      Monocytes 12.4 %      Eosinophils Automated 14.3 %      Basophils Automated 0.7 %      Immature Granulocyte 0.1 %      Nucleated RBC 0.0 /100 WBC      Neutrophils Absolute 3.23 x10 3/uL      Abs Lymph Automated 1.89 x10 3/uL      Abs Mono Automated 0.88 (H) x10 3/uL      Abs Eos Automated 1.01 (H) x10 3/uL      Absolute Baso Automated 0.05 x10 3/uL      Absolute Immature Granulocyte 0.01 x10 3/uL      Absolute NRBC 0.00 x10 3/uL         Radiology reports have been reviewed:  Radiology Results (24 Hour)     Procedure Component Value Units Date/Time    XR Chest  AP Portable [[id number]] Collected:  12/01/17 1931    Order Status:  Completed Updated:  12/01/17 1935    Narrative:       HISTORY: Cough.    COMMENT:  Frontal view of the chest was obtained and compared to 06/05/2017.    There is elevated right hemidiaphragm with mild basal atelectasis. No  focal consolidation or pleural effusions. Mild prominence of central  vasculature. Stable cardiomegaly. Left-sided transvenous pacer remains  in place.      Impression:         Cardiomegaly with mild prominence of central vasculature may represent  mild central vascular congestion. Elevated right hemidiaphragm with mild  basal atelectasis. No focal consolidation.    Donnetta Hail, MD   12/01/2017 7:31 PM    Hip Right AP and Lateral with Pelvis [[id number]] Collected:  12/01/17 1926     Order Status:  Completed Updated:  12/01/17 1935    Narrative:       History: Pain.    AP view of pelvis and frog-leg lateral view right hip were obtained and  compared to 06/02/2017. There is no fracture or dislocation. Mild  spurring right superior acetabulum. Soft tissue density projecting over  right lateral thigh unchanged. Vascular calcifications.      Impression:         No fracture seen. Mild spurring right superior acetabulum.    Donnetta Hail, MD   12/01/2017 7:31 PM    XR Knee 1 Or 2 Views Right [[id number]] Collected:  12/01/17 1928  Order Status:  Completed Updated:  12/01/17 1933    Narrative:       History: Pain.    2 views of right knee were obtained and compared to 05/26/2016.    There is no fracture or dislocation. There is knee effusion.  Degenerative changes severe bilateral medial compartment. Vascular  calcifications.      Impression:         Knee effusion. No fracture seen. Degenerative changes.    Donnetta Hail, MD   12/01/2017 7:29 PM        EKG: EKG reviewed  Last EKG Result     Procedure Component Value Units Date/Time    ECG 12 lead [[id number]] Collected:  12/01/17 1735     Updated:  12/01/17 1737     Ventricular Rate 79 BPM      Atrial Rate 53 BPM      P-R Interval -- ms      QRS Duration 190 ms      Q-T Interval 468 ms      QTC Calculation (Bezet) 536 ms      P Axis -- degrees      R Axis -69 degrees      T Axis 80 degrees     Narrative:       Ventricular-paced rhythm WITH PREMATURE VENTRICULAR OR ABERRANTLY CONDUCTED COMPLEXES  ABNORMAL ECG  WHEN COMPARED WITH ECG OF 03-Jun-2017 08:36,  NO SIGNIFICANT CHANGE WAS FOUND    EKG SCAN [[id number]] Resulted:  12/01/17 1738     Updated:  12/01/17 1738          Hospitalist:   Signed by:   Mila Merry  12/01/2017 10:55 PM    *This note was generated by the Epic EMR system/ Dragon speech recognition and may contain inherent errors or omissions not intended by the user. Grammatical errors, random word insertions, deletions, pronoun errors and  incomplete sentences are occasional consequences of this technology due to software limitations. Not all errors are caught or corrected. If there are questions or concerns about the content of this note or information contained within the body of this dictation they should be addressed directly with the author for clarification.

## 2017-12-01 NOTE — Progress Notes (Signed)
Received report from [facility] ED RN. Pt arrived on the floor at 2245 in NAD accompanied by his grandson, Mykle Pascua. Pt only speaks Khmer, interpretation services have been declined and waiver was signed. Pt's grandson has been helping with translation. Pt and grandson oriented to room 221A, telemetry was initiated by ED RN and has been verified by this Probation officer with Marija CMCT. VS taken and documented, MD orders verified, POC discussed, all questions and concerns were addressed. Call bell, phone, and bedside table within reached. Safety and fall precautions initiated.

## 2017-12-02 DIAGNOSIS — I5033 Acute on chronic diastolic (congestive) heart failure: Secondary | ICD-10-CM

## 2017-12-02 LAB — CBC AND DIFFERENTIAL
Absolute NRBC: 0 10*3/uL (ref 0.00–0.00)
Basophils Absolute Automated: 0.02 10*3/uL (ref 0.00–0.08)
Basophils Automated: 0.4 %
Eosinophils Absolute Automated: 0.05 10*3/uL (ref 0.00–0.44)
Eosinophils Automated: 0.9 %
Hematocrit: 29.8 % — ABNORMAL LOW (ref 37.6–49.6)
Hgb: 9.7 g/dL — ABNORMAL LOW (ref 12.5–17.1)
Immature Granulocytes Absolute: 0.02 10*3/uL (ref 0.00–0.07)
Immature Granulocytes: 0.4 %
Lymphocytes Absolute Automated: 0.53 10*3/uL (ref 0.42–3.22)
Lymphocytes Automated: 10 %
MCH: 30.6 pg (ref 25.1–33.5)
MCHC: 32.6 g/dL (ref 31.5–35.8)
MCV: 94 fL (ref 78.0–96.0)
MPV: 10.3 fL (ref 8.9–12.5)
Monocytes Absolute Automated: 0.19 10*3/uL — ABNORMAL LOW (ref 0.21–0.85)
Monocytes: 3.6 %
Neutrophils Absolute: 4.47 10*3/uL (ref 1.10–6.33)
Neutrophils: 84.7 %
Nucleated RBC: 0 /100 WBC (ref 0.0–0.0)
Platelets: 147 10*3/uL (ref 142–346)
RBC: 3.17 10*6/uL — ABNORMAL LOW (ref 4.20–5.90)
RDW: 13 % (ref 11–15)
WBC: 5.28 10*3/uL (ref 3.10–9.50)

## 2017-12-02 LAB — GLUCOSE WHOLE BLOOD - POCT
Whole Blood Glucose POCT: 206 mg/dL — ABNORMAL HIGH (ref 70–100)
Whole Blood Glucose POCT: 246 mg/dL — ABNORMAL HIGH (ref 70–100)
Whole Blood Glucose POCT: 282 mg/dL — ABNORMAL HIGH (ref 70–100)
Whole Blood Glucose POCT: 289 mg/dL — ABNORMAL HIGH (ref 70–100)

## 2017-12-02 LAB — BASIC METABOLIC PANEL
Anion Gap: 11 (ref 5.0–15.0)
BUN: 54.7 mg/dL — ABNORMAL HIGH (ref 9.0–28.0)
CO2: 19 mEq/L — ABNORMAL LOW (ref 22–29)
Calcium: 9 mg/dL (ref 7.9–10.2)
Chloride: 105 mEq/L (ref 100–111)
Creatinine: 3.5 mg/dL — ABNORMAL HIGH (ref 0.7–1.3)
Glucose: 160 mg/dL — ABNORMAL HIGH (ref 70–100)
Potassium: 5.4 mEq/L — ABNORMAL HIGH (ref 3.5–5.1)
Sodium: 135 mEq/L — ABNORMAL LOW (ref 136–145)

## 2017-12-02 LAB — B-TYPE NATRIURETIC PEPTIDE: B-Natriuretic Peptide: 1159.3 pg/mL — ABNORMAL HIGH (ref 0.0–100.0)

## 2017-12-02 LAB — ECG 12-LEAD
Atrial Rate: 53 {beats}/min
Q-T Interval: 468 ms
QRS Duration: 190 ms
QTC Calculation (Bezet): 536 ms
R Axis: -69 degrees
T Axis: 80 degrees
Ventricular Rate: 79 {beats}/min

## 2017-12-02 LAB — URIC ACID: Uric acid: 8.7 mg/dL (ref 3.6–9.7)

## 2017-12-02 LAB — GFR: EGFR: 16.6

## 2017-12-02 LAB — TROPONIN I: Troponin I: 0.03 ng/mL (ref 0.00–0.09)

## 2017-12-02 MED ORDER — POLYETHYLENE GLYCOL 3350 17 G PO PACK
17.00 g | PACK | Freq: Once | ORAL | Status: DC
Start: 2017-12-02 — End: 2017-12-08
  Filled 2017-12-02: qty 1

## 2017-12-02 MED ORDER — SENNOSIDES-DOCUSATE SODIUM 8.6-50 MG PO TABS
1.00 | ORAL_TABLET | Freq: Two times a day (BID) | ORAL | Status: DC
Start: 2017-12-02 — End: 2017-12-05
  Administered 2017-12-02 – 2017-12-05 (×5): 1 via ORAL
  Filled 2017-12-02 (×5): qty 1

## 2017-12-02 MED ORDER — METHYLPREDNISOLONE SODIUM SUCC 40 MG IJ SOLR
40.00 mg | Freq: Two times a day (BID) | INTRAMUSCULAR | Status: DC
Start: 2017-12-02 — End: 2017-12-03
  Administered 2017-12-02 (×2): 40 mg via INTRAVENOUS
  Filled 2017-12-02: qty 1

## 2017-12-02 MED ORDER — COLCHICINE 0.6 MG PO TABS
0.60 mg | ORAL_TABLET | Freq: Every day | ORAL | Status: DC
Start: 2017-12-02 — End: 2017-12-05
  Administered 2017-12-02 – 2017-12-04 (×2): 0.6 mg via ORAL
  Filled 2017-12-02 (×2): qty 1

## 2017-12-02 NOTE — Plan of Care (Signed)
Problem: Day of Admission - Heart Failure  Goal: Heart Failure Admission  Outcome: Progressing   12/02/17 0004   Goal/Interventions addressed this shift   Heart Failure Admission Standing Weight on admission, if unable to stand zero the bed and use the bed scale;Vital signs and telemetry per policy;Strict Intake/Output;Fluid restriction;Assess for swelling/edema and document;Oxygen as needed

## 2017-12-02 NOTE — Progress Notes (Signed)
Transitional Care Management     Writer met with the patient. Writer provided an introduction and information about the Transitional Care Management (TCM) program and outpatient case manager's role following discharge for  CHF management; patient verbalized confirmation and understanding. Writer reviewed CHF Commitment to Care Tool with patient and provided a copy for patient's information.The patient identified spouse as the primary contact post discharge: identified [phone] as best contact number. Writer provided the patient with a TCM program brochure and writer's contact information for follow-up post discharge. Writer will continue to monitor the patient's discharge plan for appropriateness of TCM program involvement post discharge.       Gae Dry, RN MSN  [facility]  Case Manager  [phone]

## 2017-12-02 NOTE — Progress Notes (Signed)
AM labs relayed, this includes BMP, BNP, and GFR to Hartojo, MD per Hartojo, MD continue to monitor the pt.

## 2017-12-02 NOTE — Progress Notes (Signed)
[facility] HOSPITALIST  Progress Note  Patient Info:   Date/Time: 12/02/2017 / 7:04 PM   Admit Date:12/01/2017  Patient Name:Marco Bruce   YEB:[id number]   PCP: Danella Sensing, MD  Attending Physician:Tanyon Alipio, Lorin Glass, MD     Assessment and Plan:   Patient [facility] Problem List:   Acute on chronic diastolic congestive heart failure (08/13/2016)    Assessment: Patient's BNP is 1200.  His weight has gone up 30 pounds since his last admission about 6 months ago.  He denied any orthopnea or PND.  He does very little activity.  He denies any chest pains or palpitations.  He is unable to provide me with the name of the physician who is following him and directing his diuretic therapy.  Echocardiogram May 2018 showed 40% ejection fraction and T TEE 4/19 showed an ejection fraction of 40% with a mild to moderate MR and mild AR.  Patient has about chronic pacemaker that was placed in November 2017.  He is now off all cigarettes.    Plan: Appreciate consultation with cardiology.  Patient to have diuretic therapy with daily monitoring of electrolytes and renal function.  He has chronic stage IV kidney disease.  May need nephrology to follow with [place].   Stage 4 chronic kidney disease ()    Assessment: She has been seen by [facility], Dayna Barker, MD in the past.he has chronic kidney disease dating back to 2007 with a baseline creatinine of 1.6.  UA has shown no hematuria.  Ultrasound of the kidneys 08/20/16 show in the right kidney to be 10 cm and the left 10.5 cm    Plan: We will watch the patient and if kidney function worsens then we will ask them to see the patient again.   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  His blood pressure today was 102/57 with a pulse of 80    Plan: No change in medications   Mixed hyperlipidemia ()    Assessment: Patient is on Lipitor 20 mg daily   plan: No changes in  medication   Type 2 diabetes mellitus with complication, with long-term current use of insulin ()    Assessment: Last A1c was done February 2019 and was 7.6    Plan: We will continue to adjust medications.  Continue with carbohydrate consistent diet   Acute gout of right knee (06/05/2017)    Assessment: Pain is improving with the corticosteroids that were provided for the patient's wheezing    Plan: Decrease of corticosteroids and continue the patient on allopurinol and the addition of colchicine.  Corticosteroids can sometimes limit the fluid diuresis.   Acute bronchitis due to other specified organisms (12/01/2017)    Assessment: Chest x-ray negative other than a chronically elevated right hemidiaphragm.  Patient is wheezing.    Plan: No antibiotics required we will continue to monitor fever curve and check procalcitonin   Pacemaker (12/01/2017)    Assessment: This is placed on the left side last year and appears to be functioning well on the telemetry monitor.  The wound site is well-healed    Plan: We will monitor      DVT Prohylaxis:lovenox and SEDs   Central Line/Foley Catheter/PICC line status: No  Code Status: Full Code  Disposition:home  Type of Admission:Inpatient  Expected Date of Discharge: 48 hours  Milestones required for discharge: Work on diuresis and monitor renal function closely.  Patient has had  a cardiology consultation and may need renal consultation as well.  We will watch the electrolytes and renal function closely.  PT OT consult to see if the patient is cleared to take care of himself at home.    Subjective:   Chief Complaint:  Shortness of Breath and Knee Pain  Patient originally [place] from [place].  He does not speak any [place].  He is asked that his grandson to the translating.  I offered him an interpreter.    ROS  Objective:     Vitals:    12/02/17 0810 12/02/17 1003 12/02/17 1340 12/02/17 1708   BP:  114/62 102/57 111/53   Pulse: 78 87 76 67   Resp: 16 17 16 16    Temp:  98.4 F (36.9  C) 97 F (36.1 C) 97.3 F (36.3 C)   TempSrc:  Oral     SpO2:  95% 93% 97%   Weight:       Height:           Intake/Output Summary (Last 24 hours) at 12/02/17 1904  Last data filed at 12/02/17 1300   Gross per 24 hour   Intake               30 ml   Output              350 ml   Net             -320 ml    Physical Exam   Constitutional: He is oriented to person, place, and time.  As per his interpreter which is his grandson he appears well-developed and well-nourished. No distress.  He is smiling and pleasant.  There is an exhalation wheeze that is audible at the bedside  HENT:   Head: Normocephalic and atraumatic.   Eyes: Pupils are equal, round, and reactive to light.   Neck: Normal range of motion. Neck supple. No JVD present.   Cardiovascular: Normal rate, regular rhythm, normal heart sounds and intact distal pulses.    Pulmonary/Chest: Effort normal. He has softer wheezes (expiratory). He has no rales. He exhibits no tenderness.  Dullness at the right base to percussion  Abdominal: Soft. Bowel sounds are normal. He exhibits no distension and no mass. There is no tenderness. There is no guarding.   Musculoskeletal: Normal range of motion. He exhibits no edema.  There is mild to moderate tenderness of a mildly warm right knee.  There is also mild effusion of the right knee.  No signs of gout in the feet.  Lymphadenopathy:     He has no cervical, clavicular, axillary, or inguinal adenopathy.   Neurological: He is alert and oriented to person, place, and time. He displays normal reflexes. No cranial nerve deficit. Coordination normal.   Skin: Skin is warm.   Psychiatric: He has a normal mood and affect.     Medications:      Scheduled Meds: PRN Meds:      albuterol-ipratropium 3 mL Nebulization Q6H [place]   allopurinol 100 mg Oral Daily   aspirin 81 mg Oral Daily   atorvastatin 20 mg Oral Daily   colchicine 0.6 mg Oral Daily   enoxaparin 30 mg Subcutaneous Daily   furosemide 40 mg Intravenous BID   gabapentin 100 mg  Oral TID   insulin glargine 16 Units Subcutaneous QHS   insulin lispro 1-3 Units Subcutaneous QHS   insulin lispro 1-5 Units Subcutaneous TID AC   methylPREDNISolone 40 mg Intravenous BID  metoprolol succinate XL 25 mg Oral Daily   NIFEdipine 120 mg Oral Daily   pantoprazole 40 mg Oral QAM AC   polyethylene glycol 17 g Oral Once   senna-docusate 1 tablet Oral BID   valsartan 80 mg Oral Daily       Continuous Infusions:     cyclobenzaprine 10 mg Q8H PRN   dextrose 15 g of glucose PRN   And     dextrose 12.5 g PRN   And     glucagon (rDNA) 1 mg PRN   guaiFENesin 200 mg Q6H PRN   naloxone 0.2 mg PRN           Results of Labs/imaging   Labs and radiology reports have been reviewed.    Hospitalist   Signed by:   Timoteo Gaul  12/02/2017 7:04 PM    *This note was generated by the Epic EMR system/ Dragon speech recognition and may contain inherent errors or omissions not intended by the user. Grammatical errors, random word insertions, deletions, pronoun errors and incomplete sentences are occasional consequences of this technology due to software limitations. Not all errors are caught or corrected. If there are questions or concerns about the content of this note or information contained within the body of this dictation they should be addressed directly with the author for clarification

## 2017-12-02 NOTE — Progress Notes (Addendum)
Situation 84 yo m admitted with knee pain, CHF, CHF nstemi, CKD stg 4.    Background  Pt lives with his daughter, son in law, and grandson in a 2 story town home. Pt resides on the main level with his bed and bathroom. Prior to admission, pt is independent with ADL's, family assists with driving pt to MD appointments, cooking, cleaning, and laundry. Pt is currently active with [employer] for aide services.      Assessment Pt speaks [place], his grandson is at bedside and translates for patient. Pt is expected to remain inpatient for 3-4 days until stable. Bedside meds offered, pts grandson agreeable, will discuss with pts daughter. PT/OT eval entered per Dr. Trixie Rude.      Recommendation Possible [place] Sunday or Monday with HH, SN & PT. CM will continue to follow.           08 /30/19 1159   Patient Type   Within 30 Days of Previous Admission? No   Healthcare Decisions   Interviewed: Patient;Family  (grandson Wadsworth @ bedside, translates for patient. )   Interviewee Contact Information: Danielle Rankin   Orientation/Decision Making Abilities of Patient Alert and Oriented x3, able to make decisions   Advance Directive Patient does not have advance directive   Healthcare Agent Appointed No   Additional Emergency Contacts? Danielle Rankin [name] - Daughter [phone]   Prior to admission   Prior level of function Needs assistance with ADLs   Type of Residence Private residence   Home Layout Multi-level;Able to live on main level with bedroom/bathroom;Performs ADL's on one level   Have running water, electricity, heat, etc? Yes   Living Arrangements Children;Family members  (Pt lives with his daughter, SIL and Grandson.)   How do you get to your MD appointments? Family transports   How do you get your groceries? Family shops   Who fixes your meals? Family   Who does your laundry? Family   Who picks up your prescriptions? Family   Dressing Independent   Grooming Independent   Feeding Independent   Bathing  Independent   Toileting Independent   DME Currently at [employer] walker;[employer];[place] Other (Comment)  (Pt is active wtih [employer] assistive services. )   Adult Protective Services (APS) involved? No   Discharge Planning   Support Systems Children;Family members   Patient expects to be discharged to: TBD   Anticipated [place] plan discussed with: Same as interviewed;Family   Mode of transportation: Private car (family member)   Does the patient have perscription coverage? Yes   Consults/Providers   PT Evaluation Needed 1   OT Evalulation Needed 1   SLP Evaluation Needed 2   Correct PCP listed in Epic? Yes   PCP   PCP on file was verified as the current PCP? Yes   Important Message from [facility] Notice   Patient received 1st IMM Letter? Yes   Date of most recent IMM given: 12/02/17

## 2017-12-02 NOTE — Consults (Signed)
[facility]    Date Time: 12/02/17 9:16 AM  Patient Name: [facility]  Requesting Physician: Timoteo Gaul, MD       Reason for Consultation:   CHF      History:   Marco Bruce is a 84 y.o. male admitted on 12/01/2017.  We have been asked by Timoteo Gaul, MD,  to provide cardiac consultation, regarding CHF. Patient presented to the emergency room with complaints primarily of arthritis in his right knee.  He has noticed shortness of breath and some swelling in his abdomen only the last few days.  The patient said that was not the reason that he came to the ER.  He has a history of chronic heart failure and was hospitalized it appears in February/March with the last exacerbation.  He was followed up once after discharge but then missed a subsequent appointment and has not been seen since the springtime.  His weight has increased by almost 30 pounds.  He was 159 pounds on last discharge and now is 193 pounds.  He says he has been compliant with his medications and somebody has been telling him to increase his diuretics as an outpatient.  On admission he was found to have an elevated BNP at 1200 and worsening renal function.  He has been started on IV diuretics.  Shortness of breath has improved.  He says his knee pain which was actually his primary complaint has resolved    Past Medical History:     Past Medical History:   Diagnosis Date   . Acute diastolic heart failure 59/53/9672   . Acute on chronic diastolic congestive heart failure 08/13/2016   . Acute pulmonary edema 05/26/2015   . ARF (acute renal failure) 05/26/2015   . Chronic diastolic heart failure 89/79/1504   . Chronic gout    . Chronic pain syndrome 07/07/2015   . CKD (chronic kidney disease) stage 4, GFR 15-29 ml/min     2018 baseline creatinine in EPIC is 1.5-2.3.   . Congestive heart failure (CHF)     EF 40% 5/18 echo, on lasix   . Current chronic use of systemic steroids     For Polymyalgia  rheumatica   . Essential hypertension    . Mixed hyperlipidemia    . NSTEMI (non-ST elevated myocardial infarction) 08/13/2016   . Osteoarthritis of both knees    . Peripheral edema 06/16/2015   . Pneumonia due to infectious organism, unspecified laterality, unspecified part of lung 06/25/2015   . Polymyalgia rheumatica     chronic steroids   . 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 Second degree heart block by electrocardiogram (ECG) 04/27/2015   . Type 2 diabetes mellitus with complication, with long-term current use of insulin     12/01/2016 Hemoglobin A1c 8.2%       Past Surgical History:     Past Surgical History:   Procedure Laterality Date   . ABDOMINAL SURGERY     . CHOLECYSTECTOMY     . PACEMAKER Left 2017       Family History:     Family History   Problem Relation Age of Onset   . Hypertension Father        Social History:     Social History     Social History   . Marital status: Widowed     Spouse name: N/A   . Number of children: N/A   . Years of education: N/A  Social History Main Topics   . Smoking status: Never Smoker   . Smokeless tobacco: Never Used   . Alcohol use No      Comment: former alcoholic   . Drug use: No   . Sexual activity: Not on file     Other Topics Concern   . Not on file     Social History Narrative   . No narrative on file       Allergies:   No Known Allergies    Medications:     Prescriptions Prior to Admission   Medication Sig   . acetaminophen (TYLENOL) 325 MG tablet Take 2 tablets (650 mg total) by mouth every 4 (four) hours as needed for Fever (Temperature greater than 38 C/100.4 F).   Marland Kitchen allopurinol (ZYLOPRIM) 100 MG tablet Take 1 tablet (100 mg total) by mouth daily   . atorvastatin (LIPITOR) 20 MG tablet Take 1 tablet (20 mg total) by mouth daily   . cetirizine (ZYRTEC) 10 MG tablet Take 1 tablet (10 mg total) by mouth daily as needed for Rhinitis.   Marland Kitchen docusate sodium (COLACE) 100 MG capsule Take 100 mg by mouth 2 (two) times daily.       . furosemide (LASIX) 20 MG tablet  Take 2 tablets (40 mg total) by mouth daily   . gabapentin (NEURONTIN) 100 MG capsule TAKE ONE CAPSULE BY MOUTH EVERY 8 HOURS   . hydrALAZINE (APRESOLINE) 50 MG tablet Take 1 tablet (50 mg total) by mouth every morning   . insulin glargine (LANTUS SOLOSTAR) 100 UNIT/ML injection pen Inject 20 Units into the skin nightly   . linaGLIPtin 5 MG Tab Take 1 tablet (5 mg total) by mouth daily   . metoprolol succinate XL (TOPROL-XL) 25 MG 24 hr tablet Take 1 tablet (25 mg total) by mouth daily   . NIFEdipine (PROCARDIA XL) 60 MG 24 hr tablet TAKE 2 TABLETS BY MOUTH Every morning.   . pantoprazole (PROTONIX) 40 MG tablet TAKE ONE TABLET BY MOUTH EVERY DAY   . Probiotic Product (RISAQUAD PO) Take 1 capsule by mouth daily.   . valsartan (DIOVAN) 80 MG tablet Take 1 tablet (80 mg total) by mouth daily.       Current Facility-Administered Medications   Medication Dose Route Frequency Provider Last Rate Last Dose   . albuterol-ipratropium (DUO-NEB) 2.5-0.5(3) mg/3 mL nebulizer 3 mL  3 mL Nebulization Q6H [place] Aswad, Bukir Z, MD   3 mL at 12/02/17 0810   . allopurinol (ZYLOPRIM) tablet 100 mg  100 mg Oral Daily Aswad, Bukir Z, MD       . aspirin chewable tablet 81 mg  81 mg Oral Daily Aswad, Bukir Z, MD       . atorvastatin (LIPITOR) tablet 20 mg  20 mg Oral Daily Aswad, Bukir Z, MD       . cyclobenzaprine (FLEXERIL) tablet 10 mg  10 mg Oral Q8H PRN Aswad, Bukir Z, MD       . dextrose (GLUCOSE) 40 % oral gel 15 g of glucose  15 g of glucose Oral PRN Aswad, Bukir Z, MD        And   . dextrose 50 % bolus 12.5 g  12.5 g Intravenous PRN Aswad, Bukir Z, MD        And   . glucagon (rDNA) (GLUCAGEN) injection 1 mg  1 mg Intramuscular PRN Aswad, Bukir Z, MD       . docusate sodium (COLACE) capsule 100 mg  100 mg Oral BID Mila Merry, MD   100 mg at 12/01/17 2328   . enoxaparin (LOVENOX) syringe 30 mg  30 mg Subcutaneous Daily Aswad, Bukir Z, MD       . furosemide (LASIX) injection 40 mg  40 mg Intravenous BID Mila Merry, MD   40 mg  at 12/02/17 0850   . gabapentin (NEURONTIN) capsule 100 mg  100 mg Oral TID Mila Merry, MD   100 mg at 12/01/17 2327   . guaiFENesin (ROBITUSSIN) oral solution 200 mg  200 mg Oral Q6H PRN Mila Merry, MD   200 mg at 12/01/17 2351   . insulin glargine (LANTUS) injection 16 Units  16 Units Subcutaneous QHS Aswad, Bukir Z, MD       . insulin lispro (HumaLOG) injection 1-3 Units  1-3 Units Subcutaneous QHS Aswad, Bukir Z, MD       . insulin lispro (HumaLOG) injection 1-5 Units  1-5 Units Subcutaneous TID AC Mila Merry, MD   2 Units at 12/02/17 0845   . methylPREDNISolone sodium succinate (Solu-MEDROL) injection 40 mg  40 mg Intravenous BID Timoteo Gaul, MD       . metoprolol succinate XL (TOPROL-XL) 24 hr tablet 25 mg  25 mg Oral Daily Aswad, Bukir Z, MD       . naloxone (NARCAN) injection 0.2 mg  0.2 mg Intravenous PRN Aswad, Bukir Z, MD       . NIFEdipine XL (PROCARDIA XL) 24 hr tablet 120 mg  120 mg Oral Daily Aswad, Bukir Z, MD       . pantoprazole (PROTONIX) EC tablet 40 mg  40 mg Oral QAM AC Aswad, Bukir Z, MD   40 mg at 12/02/17 0845   . valsartan (DIOVAN) tablet 80 mg  80 mg Oral Daily Aswad, Bukir Z, MD             Review of Systems:    Comprehensive review of systems including constitutional, eyes, ears, nose, mouth, throat, cardiovascular, GI, GU, musculoskeletal, integumentary, respiratory, neurologic, psychiatric, and endocrine is negative other than what is mentioned already in the history of present illness    Physical Exam:     Vitals:    12/02/17 0459   BP: 107/56   Pulse: 82   Resp: 14   Temp: 97.5 F (36.4 C)   SpO2: 94%     Temp (24hrs), Avg:98.4 F (36.9 C), Min:97.5 F (36.4 C), Max:99.9 F (37.7 C)      Intake and Output Summary (Last 24 hours) at Date Time    Intake/Output Summary (Last 24 hours) at 12/02/17 0916  Last data filed at 12/01/17 2356   Gross per 24 hour   Intake               30 ml   Output              100 ml   Net              -70 ml     GENERAL: Patient is  in no acute distress   HEENT: No scleral icterus or conjunctival pallor, moist mucous membranes   NECK: No jugular venous distention or thyromegaly, normal carotid upstrokes without bruits   CARDIAC: Normal apical impulse, regular rate and rhythm, with normal S1 and S2, systolic murmur LSB  CHEST: Bibasilar crackles  ABDOMEN: Soft, nontender  EXTREMITIES: 1+ bilateral edema  SKIN: No rash or jaundice   NEUROLOGIC: Alert  and oriented to time, place and person, normal mood and affect    MUSCULOSKELETAL: Normal muscle strength and tone.      Labs Reviewed:       Recent Labs  Lab 12/02/17  0314 12/01/17  2304 12/01/17  1732   Troponin I 0.03 0.03 0.02               Recent Labs  Lab 12/01/17  1733   Bilirubin, Total 0.4   Protein, Total 6.1   Albumin 4.0   ALT 6   AST (SGOT) 22       Recent Labs  Lab 12/01/17  1733   Magnesium 2.0           Recent Labs  Lab 12/02/17  0314 12/01/17  1733   WBC 5.28 7.07   Hgb 9.7* 10.5*   Hematocrit 29.8* 31.7*   Platelets 147 160       Recent Labs  Lab 12/02/17  0314 12/01/17  1733   Sodium 135* 136   Potassium 5.4* 5.1   Chloride 105 105   CO2 19* 23   BUN 54.7* 50.7*   Creatinine 3.5* 2.8*   EGFR 16.6 21.4   Glucose 160* 96   Calcium 9.0 9.4         Radiology   Radiological Procedure reviewed.      chest X-ray  Assessment:   1.         Acute on chronic systolic and diastolic congestive heart failure.                            a.         Left ventricular ejection fraction 75% by echocardiogram in 2017, reduced to                                       40% by echocardiogram in May of 2018.    B. TTE 07/2017:  LVEF 40%, Mild to moderate MR, mild AR.  2.         Moderate valvular disease.                          a.         Mild to Moderate MR                           b.         Mild aortic valve regurgitation.  3.         Biotronic pacemaker inserted in November of 2017.  4.         Chronic kidney disease.  5.         Hypertension.  6.         Diabetes.  7.         Anemia.  8.          Former tobacco use.  9.         Hyponatremia  10.  Poor compliance with follow-up.    Recommendations:    The patient is 25 to 30 pounds over his discharge weight in the spring 2019.  Elevated BNP is also consistent with acute congestive heart failure.  Renal function has deteriorated.   Consult nephrology to assist with diuresis.   I believe  the patient minimizes his symptoms of CHF which make management a significant challenge.  If we rely on objective data he has quite a ways to go for diuresis.   Continue Lasix until further discussion with the nephrologist but would consider switching to Bumex or torsemide upon discharge.      Signed by: Emeline General, MD      [facility]  NP [place] (8am-5pm)  MD Spectralink [phone] (8am-5pm)  After hours, non urgent consult line [phone]  After Hours, urgent consults [phone]

## 2017-12-02 NOTE — UM Notes (Signed)
84y/o male came into ER on 8/29 and admitted on same day for CHF -changed to IP on 8/30 for 2-3 days of care for CHF    Pnt with increasing shortness of breath over the past 2 weeks. Cough is productive of white phlegm. He was brought in by EMS and given a nebulizer in route. He also complains of right knee pain that rates radiates to his right hip.     VS: 99.9; p80; 97%RA; r19; 164/68; 10/10 pain     ER meds  Neb albuteral   Neb atrovent   IV morphine   IV zofran     Abn Labs  H/h 10.5/31.7   Rbc 3.37   Bun 50.7   Creat 2.8   bnp 1278.5   CXR: Cardiomegaly with mild prominence of central vasculature may represent  mild central vascular congestion. Elevated right hemidiaphragm with mild  basal atelectasis. No focal consolidation.    XR knee right:      Knee effusion. No fracture seen. Degenerative changes.        Hip right ap/lat w pelvis: no acute abn Mild spurring right superior acetabulum    Ventricular-paced rhythm WITH PREMATURE VENTRICULAR OR ABERRANTLY CONDUCTED COMPLEXES  ABNORMAL ECG  WHEN COMPARED WITH ECG OF 03-Jun-2017 08:36,  NO SIGNIFICANT CHANGE WAS FOUND                 Assessment/ Plan:   1. Acute on chronic congestive heart failure: Patient was given 40 mg of Lasix in the emergency room, he will be placed on observation, continue on IV Lasix, continue his cardiac medications, monitor BNP, consult with his cardiologist.  ([place] [facility]), continue cycling cardiac enzymes to rule out acute coronary artery event.  2. Acute bronchitis: Patient had expiratory wheezings on examination, chest x-ray showed no consolidation, I will start him on Solu-Medrol and DuoNeb breathing treatment.  3. Type 2 diabetes: Well controlled, last hemoglobin A1c was 7.6, continue on Lantus and insulin sliding scale.  4. Chronic systolic congestive heart failure, with an ejection fraction of 40 percent on his last echocardiogram, presented with acute on chronic congestive heart failure exacerbation, continue  management as above, continue on his home treatment with hydralazine, Toprol-XL, nifedipine, Diovan, aspirin and Lipitor  5. Chronic kidney disease stage IV: Creatinine 2.8, continue on Lasix, continue close monitoring of kidney function.    DVT Prohylaxis:lovenox and SEDs

## 2017-12-03 ENCOUNTER — Inpatient Hospital Stay: Payer: Medicare Other

## 2017-12-03 LAB — GLUCOSE WHOLE BLOOD - POCT
Whole Blood Glucose POCT: 247 mg/dL — ABNORMAL HIGH (ref 70–100)
Whole Blood Glucose POCT: 222 mg/dL — ABNORMAL HIGH (ref 70–100)
Whole Blood Glucose POCT: 268 mg/dL — ABNORMAL HIGH (ref 70–100)
Whole Blood Glucose POCT: 275 mg/dL — ABNORMAL HIGH (ref 70–100)

## 2017-12-03 LAB — PROCALCITONIN: Procalcitonin: 0.36 — ABNORMAL HIGH (ref 0.00–0.10)

## 2017-12-03 LAB — GFR: EGFR: 9.2

## 2017-12-03 LAB — BASIC METABOLIC PANEL
Anion Gap: 15 (ref 5.0–15.0)
BUN: 87.5 mg/dL — ABNORMAL HIGH (ref 9.0–28.0)
CO2: 16 mEq/L — ABNORMAL LOW (ref 22–29)
Calcium: 8.9 mg/dL (ref 7.9–10.2)
Chloride: 100 mEq/L (ref 100–111)
Creatinine: 5.8 mg/dL — ABNORMAL HIGH (ref 0.7–1.3)
Glucose: 258 mg/dL — ABNORMAL HIGH (ref 70–100)
Potassium: 5.6 mEq/L — ABNORMAL HIGH (ref 3.5–5.1)
Sodium: 131 mEq/L — ABNORMAL LOW (ref 136–145)

## 2017-12-03 MED ORDER — GABAPENTIN 100 MG PO CAPS
100.00 mg | ORAL_CAPSULE | Freq: Every evening | ORAL | Status: DC
Start: 2017-12-03 — End: 2017-12-08
  Administered 2017-12-03 – 2017-12-07 (×5): 100 mg via ORAL
  Filled 2017-12-03 (×5): qty 1

## 2017-12-03 MED ORDER — POLYETHYLENE GLYCOL 3350 17 G PO PACK
17.00 g | PACK | Freq: Every day | ORAL | Status: DC | PRN
Start: 2017-12-03 — End: 2017-12-05
  Administered 2017-12-03: 22:00:00 17 g via ORAL
  Filled 2017-12-03: qty 1

## 2017-12-03 MED ORDER — SODIUM CHLORIDE 0.9 % IV SOLN
500.00 mg | INTRAVENOUS | Status: AC
Start: 2017-12-03 — End: 2017-12-07
  Administered 2017-12-03 – 2017-12-07 (×5): 500 mg via INTRAVENOUS
  Filled 2017-12-03 (×5): qty 500

## 2017-12-03 MED ORDER — FUROSEMIDE 10 MG/ML IJ SOLN
40.00 mg | Freq: Every day | INTRAMUSCULAR | Status: DC
Start: 2017-12-04 — End: 2017-12-03

## 2017-12-03 MED ORDER — HEPARIN SODIUM (PORCINE) 5000 UNIT/ML IJ SOLN
5000.00 [IU] | Freq: Two times a day (BID) | INTRAMUSCULAR | Status: DC
Start: 2017-12-03 — End: 2017-12-08
  Administered 2017-12-03 – 2017-12-08 (×10): 5000 [IU] via SUBCUTANEOUS
  Filled 2017-12-03 (×10): qty 1

## 2017-12-03 MED ORDER — INSULIN LISPRO 100 UNIT/ML SC SOLN
5.00 [IU] | Freq: Three times a day (TID) | SUBCUTANEOUS | Status: DC
Start: 2017-12-03 — End: 2017-12-06
  Administered 2017-12-03 – 2017-12-06 (×6): 5 [IU] via SUBCUTANEOUS
  Filled 2017-12-03 (×6): qty 15

## 2017-12-03 MED ORDER — ALBUTEROL-IPRATROPIUM 2.5-0.5 (3) MG/3ML IN SOLN
3.00 mL | Freq: Three times a day (TID) | RESPIRATORY_TRACT | Status: DC
Start: 2017-12-03 — End: 2017-12-08
  Administered 2017-12-03 – 2017-12-08 (×16): 3 mL via RESPIRATORY_TRACT
  Filled 2017-12-03 (×13): qty 3

## 2017-12-03 MED ORDER — PREDNISONE 20 MG PO TABS
20.00 mg | ORAL_TABLET | Freq: Every morning | ORAL | Status: DC
Start: 2017-12-03 — End: 2017-12-05
  Administered 2017-12-03 – 2017-12-05 (×3): 20 mg via ORAL
  Filled 2017-12-03 (×3): qty 1

## 2017-12-03 MED ORDER — METHYLPREDNISOLONE SODIUM SUCC 40 MG IJ SOLR
20.00 mg | Freq: Two times a day (BID) | INTRAMUSCULAR | Status: DC
Start: 2017-12-03 — End: 2017-12-03
  Filled 2017-12-03: qty 1

## 2017-12-03 MED ORDER — ALBUTEROL SULFATE (2.5 MG/3ML) 0.083% IN NEBU
2.50 mg | INHALATION_SOLUTION | Freq: Once | RESPIRATORY_TRACT | Status: AC
Start: 2017-12-03 — End: 2017-12-03
  Administered 2017-12-03: 15:00:00 2.5 mg via RESPIRATORY_TRACT

## 2017-12-03 MED ORDER — SODIUM CHLORIDE 0.9 % IV MBP
1.00 g | INTRAVENOUS | Status: DC
Start: 2017-12-03 — End: 2017-12-08
  Administered 2017-12-03 – 2017-12-08 (×6): 1 g via INTRAVENOUS
  Filled 2017-12-03 (×6): qty 1000

## 2017-12-03 NOTE — OT Eval Note (Signed)
[facility]  [place]  [phone]    Occupational Therapy Evaluation    Patient: Marco Bruce    MRN#: [id number]     [id number]    Time of treatment: Time Calculation  OT Received On: 12/03/17  Start Time: 1100  Stop Time: 1141  Time Calculation (min): 41 min  OT Visit Number: 1    Consult received for Marco Bruce for OT Evaluation and Treatment.  Patient's medical condition is appropriate for Occupational therapy intervention at this time.    Assessment:   Marco Bruce is a 84 y.o. male admitted 12/01/2017.   Brief chart review completed including review of labs, review of imaging, calling pt's family/caregiver for thorough history and review of H&P and physician progress notes.  Pt's ability to complete ADLs is at baseline at this time    Assessment: Appears to be at baseline for ADL's;decreased endurance/activity tolerance     Complexity Chart Review Performance Deficits Clinical Decision Making Hx/Comorbidities Assistance needed   Low Brief 1-3 Limited options None None (or at baseline)   Moderate Expanded 3-5 Several Options 1-2 Min/Mod assist (not at baseline)   High Extensive 5 or more Multiple options 3 or more Max/dependent (not at baseline         Rehabilitation Potential:        Plan:       Treatment Interventions: No skilled interventions needed at this time     Patient Goal  Patient Goal: family wants him to go home     Risks/Benefits/POC Discussed with Pt/Family: With patient/family    Goals:   Goal Formulation: Patient                                  Discharge Recommendations:   Based on today's session patient's discharge recommendation is the following: Discharge Recommendation: Home with supervision;Home with home health OT.                  Precautions and Contraindications: Precautions  Other Precautions: falls       Medical Diagnosis: Shortness of breath [R06.02]  Primary osteoarthritis of right knee [M17.11]  Acute  renal failure superimposed on stage 4 chronic kidney disease, unspecified acute renal failure type [N17.9, N18.4]  Acute on chronic congestive heart failure, unspecified heart failure type [I50.9]    History of Present Illness: Marco Bruce is a 84 y.o. male admitted on 12/01/2017. He has a history of chronic systolic congestive heart failure with an ejection fraction of 40 percent on his last echocardiogram, with pacemaker placement since November 2017, history of type 2 diabetes, chronic kidney disease, presented to the hospital with increased shortness of breath for 2 weeks, got worse, Patient had been complaining of productive cough with white phlegm, was brought to the emergency room by EMS, after they were called for worsening shortness of breath, he received a nebulizer treatment on route, he denied a chest pain, no fever but he had chills, no nausea, vomiting or diarrhea, denied any sick contact.Patient is also complaining of right knee and right hip pain, denied any falls or any injuries, he has a known history of degenerative joint disease. (H&P)      Patient Active Problem List   Diagnosis   . Stage 4 chronic kidney disease   . Renovascular hypertension   . Hyponatremia   . Essential  hypertension   . Acute on chronic diastolic congestive heart failure   . Hypokalemia   . Mixed hyperlipidemia   . Type 2 diabetes mellitus with complication, with long-term current use of insulin   . Acute gout of right knee   . Acute bronchitis due to other specified organisms   . Pacemaker        Past Medical/Surgical History:  Past Medical History:   Diagnosis Date   . Acute diastolic heart failure 22/44/9753   . Acute on chronic diastolic congestive heart failure 08/13/2016   . Acute pulmonary edema 05/26/2015   . ARF (acute renal failure) 05/26/2015   . Chronic diastolic heart failure 00/51/1021   . Chronic gout    . Chronic pain syndrome 07/07/2015   . CKD (chronic kidney disease) stage 4, GFR 15-29 ml/min     2018 baseline  creatinine in EPIC is 1.5-2.3.   . Congestive heart failure (CHF)     EF 40% 5/18 echo, on lasix   . Current chronic use of systemic steroids     For Polymyalgia rheumatica   . Essential hypertension    . Mixed hyperlipidemia    . NSTEMI (non-ST elevated myocardial infarction) 08/13/2016   . Osteoarthritis of both knees    . Peripheral edema 06/16/2015   . Pneumonia due to infectious organism, unspecified laterality, unspecified part of lung 06/25/2015   . Polymyalgia rheumatica     chronic steroids   . 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 Second degree heart block by electrocardiogram (ECG) 04/27/2015   . Type 2 diabetes mellitus with complication, with long-term current use of insulin     12/01/2016 Hemoglobin A1c 8.2%      Past Surgical History:   Procedure Laterality Date   . ABDOMINAL SURGERY     . CHOLECYSTECTOMY     . PACEMAKER Left 2017         X-Rays/Tests/Labs:  Xr Knee 1 Or 2 Views Right    Result Date: 12/01/2017  Knee effusion. No fracture seen. Degenerative changes. Donnetta Hail, MD 12/01/2017 7:29 PM    Hip Right Ap And Lateral With Pelvis    Result Date: 12/01/2017  No fracture seen. Mild spurring right superior acetabulum. Donnetta Hail, MD 12/01/2017 7:31 PM    Xr Chest  Ap Portable    Result Date: 12/01/2017  Cardiomegaly with mild prominence of central vasculature may represent mild central vascular congestion. Elevated right hemidiaphragm with mild basal atelectasis. No focal consolidation. 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 Family and/or guardian are agreeable to patient's participation in the therapy session. Nursing clears patient for therapy.      .        Objective:   Observation of Patient/Vital Signs:  Patient is in bed with telemetry in place.         Cognition/Neuro Status  Arousal/Alertness: Appropriate responses to stimuli  Attention Span: Appears intact  Orientation Level: Unable to assess (language barrier)  Memory: Unable to assess  Following Commands: independent  Safety Awareness: minimal verbal instruction  Insights: Decreased awareness of deficits  Behavior: cooperative  Motor Planning: intact  Coordination: intact  Hand Dominance: right handed    Gross ROM  Right Upper Extremity ROM: within functional limits  Right Lower Extremity ROM: within functional limits  Gross Strength  Right Upper Extremity Strength: 4/5  Left Upper Extremity Strength: 4/5                  Self-care and Home Management  Grooming: in chair;increased time to complete;wash/dry face;wash/dry hands;Supervision  Functional Transfers: Contact Guard Assist    Mobility and Transfers  Supine to Sit: Contact Guard Assist  Sit to Stand: Contact Guard Assist;with instruction for hand placement to increase safety  Bed to Chair: Research scientist (life sciences) Sitting Balance: good  Dyanamic Sitting Balance: good  Static  Standing Balance: good  Dynamic Standing Balance: good    Participation and Endurance  Participation Effort: good  Endurance: Tolerates 10 - 20 min exercise with multiple rests  [place] Dyspnea Scale: 3+ Dyspnea  [place] RPE Modified Scale 0-10: 3 - Moderated    AM-PACT "6 Clicks" Daily Activity Inpatient Short Form  Inpatient AM-PACT Performed?: yes  Put On/Take Off Lower Body Clothing: A little  Assist with Bathing: A little  Assist with Toileting: A little  Put On/Take Off Upper Body Clothing: None  Assist with Grooming: None  Assist with Eating: None  OT Daily Activity Raw Score: 21  CMS 0-100% Score: 32.79%         Treatment Activities: Patient pleasant and cooperative with therapy session; family is present and provided translation and information throughout session. He is presenting with decreased activity tolerance however family reports this is his baseline. HE required S for verbal instructions on techniques with supine to sit and CGA with sit to stand and functional transfers. Family is reporting that hs is at baseline. Family is anxious for him to be discharged home; they have expressed concerns that he will become weak due to hospitalization. Therapist educated patient and family on role of OT, and importance of sitting up in chair vs laying in bed therapist also instructed family and patient on B UE exercises to faciltate increased endurance and strength for ADL's, positioned in edge of charir 1 x 10 reps of each performed  Shoulder flex/ext, horizontal abduction and elbow flex/ext. Patient provided with verbal/visual instructions for proper pacing and execution of mvmts. patient with shortness of breath, instructed family and patient on Educated patient in pursed lip breathing technique with focusing on taking deep breath in through their nose and exhaling through their mouth. Instructed patient on full inhale for a count of 3-5 and a full exhale for 5-8 seconds. Instructed patient on the  importance of pushing carbon dioxide from lung bases which will allow for increase oxygen intake. Advised patient to continue with pursed lip breathing until SOB  or decreased oxygen saturations resolves.   Patient left without needs and call bell within reach. chair Alarm set.  RN notified of session outcome.      Coryn Mosso, MS, OTR/L  Ext: [phone]

## 2017-12-03 NOTE — PT Eval Note (Signed)
Physical Therapy Cancellation Note    Patient: Marco Bruce  UUV:[id number]    Unit: H474/Q595-G    Patient not seen for physical therapy secondary to extreme fatigue and Dr. Verdell Face request to let pt rest today.  PT will continue to follow up as appropriate.    Roxy Manns MPT, CEAS

## 2017-12-03 NOTE — Plan of Care (Signed)
Problem: Everyday - Heart Failure  Goal: Stable Vital Signs and Fluid Balance  Outcome: Progressing   12/03/17 0050   Goal/Interventions addressed this shift   Stable Vital Signs and Fluid Balance Daily Standing Weights in the morning using the same scale, after using the bathroom and before breadfast. If unable to stand, zero the bed and use the bed scale;Monitor, assess vital signs and telemetry per policy;Wean oxygen as needed if appropriate;Monitor labs and report abnormalities to physician;Strict Intake/Output;Fluid Restriction;Assess for swelling/edema       Problem: Safety  Goal: Patient will be free from injury during hospitalization  Outcome: Progressing   12/03/17 0050   Goal/Interventions addressed this shift   Patient will be free from injury during hospitalization  Assess patient's risk for falls and implement fall prevention plan of care per policy;Provide and maintain safe environment;Ensure appropriate safety devices are available at the bedside;Include patient/ family/ care giver in decisions related to safety;Hourly rounding;Assess for patients risk for elopement and implement [place] per policy;Provide alternative method of communication if needed (communication boards, writing)       Comments: Pt remain on room air sats 95%;he has occasional non productive cough prn Robitussin given.expiratory wheezing on auscultation posterior;nebs treatment given by RT as scheduled;no sign of respiratory distress.He denies pain or discomfort;AV Paced on tele;VSS.Pt is resting comfortably at this time;will continue plan of care.

## 2017-12-03 NOTE — Progress Notes (Addendum)
[place]    Date Time: 12/03/17 8:14 AM  Patient Name: [facility]       Patient Active Problem List   Diagnosis   . Stage 4 chronic kidney disease   . Renovascular hypertension   . Hyponatremia   . Essential hypertension   . Acute on chronic diastolic congestive heart failure   . Hypokalemia   . Mixed hyperlipidemia   . Type 2 diabetes mellitus with complication, with long-term current use of insulin   . Acute gout of right knee   . Acute bronchitis due to other specified organisms   . Pacemaker       Assessment:      Acute on chronic systolic and diastolic congestive heart failure.    EF 40% by echocardiogram in May of 2018.   ECHO 07/2017:  LVEF 40%, Mild to moderate MR, mild AR.   Mild-mod MR, mild AR   Biotronic PPM 02/2016.   AKI on CKD   Hypertension.   Diabetes.   Anemia.   Former tobacco use.   Poor compliance with follow-up.    Recommendations:    No BMP drawn yet today, renal function sig worse from 8/29-8/30. Would hold further diuretic and await renal input   D/C ARB given worsening renal function and borderline BP which will further reduce renal perfusion    I have reviewed the notes, assessments, and/or procedures performed by Woodroe Chen, I concur with her/his documentation of Blaike Vickers.  Pt seen cxr w/ pulm edema and bnp <1K, after diuresis Cr 3.5->5.8  Holding diuresis would greatly appreciate nephro input.  Overall does not seem to be in overt chf this am    Dr. Vonzella Nipple    Medications:      Scheduled Meds: PRN Meds:      albuterol-ipratropium 3 mL Nebulization TID   allopurinol 100 mg Oral Daily   aspirin 81 mg Oral Daily   atorvastatin 20 mg Oral Daily   colchicine 0.6 mg Oral Daily   enoxaparin 30 mg Subcutaneous Daily   [START ON 12/04/2017] furosemide 40 mg Intravenous Daily   gabapentin 100 mg Oral TID   insulin glargine 16 Units Subcutaneous QHS   insulin lispro 1-3 Units Subcutaneous QHS   insulin lispro 1-5 Units Subcutaneous TID  AC   methylPREDNISolone 20 mg Intravenous BID   metoprolol succinate XL 25 mg Oral Daily   NIFEdipine 120 mg Oral Daily   pantoprazole 40 mg Oral QAM AC   polyethylene glycol 17 g Oral Once   senna-docusate 1 tablet Oral BID   valsartan 80 mg Oral Daily       Continuous Infusions:     cyclobenzaprine 10 mg Q8H PRN   dextrose 15 g of glucose PRN   And     dextrose 12.5 g PRN   And     glucagon (rDNA) 1 mg PRN   guaiFENesin 200 mg Q6H PRN   naloxone 0.2 mg PRN             Subjective:   Denies chest pain, SOB or palpitations.  + muscle twitching/ shaking    Physical Exam:     Vitals:    12/03/17 0758   BP:    Pulse:    Resp:    Temp:    SpO2: 98%     Temp (24hrs), Avg:97.6 F (36.4 C), Min:97 F (36.1 C), Max:98.4 F (36.9 C)      Telemetry reviewed, paced  Intake and Output Summary (Last 24 hours) at Date Time    Intake/Output Summary (Last 24 hours) at 12/03/17 0814  Last data filed at 12/03/17 0400   Gross per 24 hour   Intake              390 ml   Output              370 ml   Net               20 ml       General Appearance:  Breathing comfortable, no acute distress  Head:  normocephalic  Neck:  No jugular venous distension  Lungs:  Clear to auscultation throughout, no wheezes, rhonchi or rales, good respiratory effort   Heart:  S1, S2 normal, no S3, no S4, no murmur  Abdomen:  Soft, non-tender, positive bowel sounds  Extremities:  No cyanosis, clubbing or edema  Pulses:  Equal radial pulses, 4/4 symmetric  Neurologic:  Alert and oriented x3, mood and affect normal  Musculoskeletal: normal strength and tone    Labs:     Recent Labs  Lab 12/02/17  0314 12/01/17  2304 12/01/17  1732   Troponin I 0.03 0.03 0.02               Recent Labs  Lab 12/01/17  1733   Bilirubin, Total 0.4   Protein, Total 6.1   Albumin 4.0   ALT 6   AST (SGOT) 22       Recent Labs  Lab 12/01/17  1733   Magnesium 2.0           Recent Labs  Lab 12/02/17  0314 12/01/17  1733   WBC 5.28 7.07   Hgb 9.7* 10.5*   Hematocrit 29.8* 31.7*    Platelets 147 160       Recent Labs  Lab 12/02/17  0314 12/01/17  1733   Sodium 135* 136   Potassium 5.4* 5.1   Chloride 105 105   CO2 19* 23   BUN 54.7* 50.7*   Creatinine 3.5* 2.8*   EGFR 16.6 21.4   Glucose 160* 96   Calcium 9.0 9.4           Invalid input(s): FREET4    .  Lab Results   Component Value Date    BNP 1,159.3 (H) 12/02/2017      Estimated Creatinine Clearance: 13.1 mL/min (A) (based on SCr of 3.5 mg/dL (H)).    Weight Monitoring 06/09/2017 06/10/2017 07/04/2017 08/08/2017 12/01/2017 12/02/2017 12/03/2017   Height - - - 157.5 cm 157.5 cm - -   Height Method - - - - Stated - -   Weight 72.2 kg 72.9 kg 70.761 kg 76.658 kg 80.151 kg 87.8 kg 80.196 kg   Weight Method Standing Scale Bed Scale - - Bed Scale Bed Scale Standing Scale   BMI (calculated) - - - 31 kg/m2 32.4 kg/m2 - -         Imaging:   Radiological Procedure reviewed.              Signed by: Mariana Kaufman, NP        [facility]  NP [place] (8am-5pm)  MD Spectralink [phone] (8am-5pm)  After hours, non urgent consult line [phone]  After Hours, urgent consults [phone]

## 2017-12-03 NOTE — Progress Notes (Addendum)
[facility] H[facility]PITAL[facility]  Progress Note  Patient Info:   Date/Time: 12/03/2017 / 3:27 PM   Admit Date:12/01/2017  Patient Name:Marco Bruce   QNV:[id number]   PCP: Marco Sensing, MD  Attending Physician:Marco Bruce, Marco Glass, MD     Assessment and Plan:   Patient Marco Bruce Problem List:   Acute on chronic diastolic congestive heart failure (08/13/2016)    Assessment: Patient's BNP is 1200.  His weight has gone up 30 pounds since his last admission about 6 months ago.  He denied any orthopnea or PND.  He does very little activity.  He denies any chest pains or palpitations.  He is unable to provide me with the name of the physician who is following him and directing his diuretic therapy.  Echocardiogram May 2018 showed 40% ejection fraction and T TEE 4/19 showed an ejection fraction of 40% with a mild to moderate MR and mild AR.  Patient has about chronic pacemaker that was placed in November 2017.  He is now off all cigarettes.    Plan: Appreciate consultation with cardiology.  Holding diuretic therapy with wosening renal function.  He has chronic stage IV kidney disease.Have asked nephrology to follow with [place].   Stage 4 chronic kidney disease ()    Assessment: She has been seen by [facility], Marco Barker, MD in the past.he has chronic kidney disease dating back to 2007 with a baseline creatinine of 1.6.  UA has shown no hematuria.  Ultrasound of the kidneys 08/20/16 show in the right kidney to be 10 cm and the left 10.5 cm    Plan: Kidney function worsened.Nephrology consulted and will see patient tomorrow.   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  His blood pressure today was 102/57 with a pulse of 80    Plan: No change in medications   Mixed hyperlipidemia ()    Assessment: Patient is on Lipitor 20 mg daily   plan: No changes in medication   Type 2 diabetes mellitus with complication, with long-term  current use of insulin ()    Assessment: Last A1c was done February 2019 and was 7.6    Plan: We will continue to adjust medications.  Continue with carbohydrate consistent diet   Acute gout of right knee (06/05/2017)    Assessment: Pain is improving with the corticosteroids that were provided for the patient's wheezing    Plan: Decrease of corticosteroids and continue the patient on allopurinol and the addition of colchicine.  Corticosteroids can sometimes limit the fluid diuresis.   Acute bronchitis due to other specified organisms (12/01/2017)    Assessment: Chest x-ray negative other than a chronically elevated right hemidiaphragm.  Patient is wheezing.    Plan:start antibiotics with patient having chills rigors and sweats.recheck CXR. Check procalcitonin   Pacemaker (12/01/2017)    Assessment: This is placed on the left side last year and appears to be functioning well on the telemetry monitor.  The wound site is well-healed    Plan: We will monitor      DVT Prohylaxis:heparin and SEDs   Central Line/Foley Catheter/PICC line status: No  Code Status: Full Code  Disposition:home  Type of Admission:Inpatient  Expected Date of Discharge: 48 hours  Milestones required for discharge: Work on diuresis and monitor renal function closely.  Patient has had a cardiology consultation and may need renal consultation as well.  We will watch the electrolytes and renal function closely.  PT OT consult to see if the patient is cleared to take care of himself at home.    Subjective:   Chief Complaint:  Shortness of Breath and Knee Pain  Patient originally [place] from [place].  He does not speak any [place].  He is asked that his grandson and daughter do the translating.  I offered him an interpreter.  She had an episode of mucus plugging earlier today.  Is in the room when he suddenly could not breathe and turned somewhat red in the face.  Brought into a sitting position and he felt better.  Stat neb treatment was provided and chest  x-ray showed only the right hemidiaphragm and evidence of CHF.  No signs of pneumonia.  She was complaining of sweats and chills.    ROS  Objective:     Vitals:    12/03/17 0900 12/03/17 1033 12/03/17 1100 12/03/17 1341   BP:  92/43  110/56   Pulse: 95 80 81 72   Resp:  13  14   Temp:  97.5 F (36.4 C)  97.5 F (36.4 C)   TempSrc:  Oral     SpO2:  97%  93%   Weight:       Height:           Intake/Output Summary (Last 24 hours) at 12/03/17 1527  Last data filed at 12/03/17 0400   Gross per 24 hour   Intake              390 ml   Output              120 ml   Net              270 ml    Physical Exam   Constitutional: He is oriented to person, place, and time.  As per his interpreter which is his grandson he appears well-developed and well-nourished. No distress.  He is smiling and pleasant.  There is an exhalation wheeze that is audible at the bedside  HENT:   Head: Normocephalic and atraumatic.   Eyes: Pupils are equal, round, and reactive to light.   Neck: Normal range of motion. Neck supple. No JVD present.   Cardiovascular: Normal rate, regular rhythm, normal heart sounds and intact distal pulses.    Pulmonary/Chest: Effort normal. He has softer wheezes (expiratory). He has no rales. He exhibits no tenderness.  Dullness at the right base to percussion  Abdominal: Soft. Bowel sounds are normal. He exhibits no distension and no mass. There is no tenderness. There is no guarding.   Musculoskeletal: Normal range of motion. He exhibits no edema.  There is mild to moderate tenderness of a mildly warm right knee.  There is also mild effusion of the right knee.  No signs of gout in the feet.  Lymphadenopathy:     He has no cervical, clavicular, axillary, or inguinal adenopathy.   Neurological: He is alert and oriented to person, place, and time. He displays normal reflexes. No cranial nerve deficit. Coordination normal.   Skin: Skin is warm.   Psychiatric: He has a normal mood and affect.     Medications:      Scheduled  Meds: PRN Meds:        albuterol-ipratropium 3 mL Nebulization TID   allopurinol 100 mg Oral Daily   aspirin 81 mg Oral Daily   atorvastatin 20 mg Oral Daily   azithromycin 500 mg Intravenous Q24H [facility]   cefTRIAXone 1 g Intravenous  Q24H [place]   colchicine 0.6 mg Oral Daily   gabapentin 100 mg Oral TID   heparin (porcine) 5,000 Units Subcutaneous Q12H [place]   insulin glargine 16 Units Subcutaneous QHS   insulin lispro 1-3 Units Subcutaneous QHS   insulin lispro 1-5 Units Subcutaneous TID AC   insulin lispro 5 Units Subcutaneous TID AC   methylPREDNISolone 20 mg Intravenous BID   metoprolol succinate XL 25 mg Oral Daily   NIFEdipine 120 mg Oral Daily   pantoprazole 40 mg Oral QAM AC   polyethylene glycol 17 g Oral Once   senna-docusate 1 tablet Oral BID       Continuous Infusions:     dextrose 15 g of glucose PRN   And     dextrose 12.5 g PRN   And     glucagon (rDNA) 1 mg PRN   guaiFENesin 200 mg Q6H PRN   naloxone 0.2 mg PRN           Results of Labs/imaging   Labs and radiology reports have been reviewed.    Hospitalist   Signed by:   Timoteo Gaul  12/03/2017 3:27 PM    *This note was generated by the Epic EMR system/ Dragon speech recognition and may contain inherent errors or omissions not intended by the user. Grammatical errors, random word insertions, deletions, pronoun errors and incomplete sentences are occasional consequences of this technology due to software limitations. Not all errors are caught or corrected. If there are questions or concerns about the content of this note or information contained within the body of this dictation they should be addressed directly with the author for clarification

## 2017-12-04 DIAGNOSIS — J40 Bronchitis, not specified as acute or chronic: Secondary | ICD-10-CM | POA: Diagnosis present

## 2017-12-04 DIAGNOSIS — J986 Disorders of diaphragm: Secondary | ICD-10-CM | POA: Diagnosis present

## 2017-12-04 LAB — GLUCOSE WHOLE BLOOD - POCT
Whole Blood Glucose POCT: 194 mg/dL — ABNORMAL HIGH (ref 70–100)
Whole Blood Glucose POCT: 206 mg/dL — ABNORMAL HIGH (ref 70–100)
Whole Blood Glucose POCT: 253 mg/dL — ABNORMAL HIGH (ref 70–100)
Whole Blood Glucose POCT: 167 mg/dL — ABNORMAL HIGH (ref 70–100)
Whole Blood Glucose POCT: 262 mg/dL — ABNORMAL HIGH (ref 70–100)

## 2017-12-04 LAB — BASIC METABOLIC PANEL
Anion Gap: 14 (ref 5.0–15.0)
BUN: 104.4 mg/dL — ABNORMAL HIGH (ref 9.0–28.0)
CO2: 16 mEq/L — ABNORMAL LOW (ref 22–29)
Calcium: 8 mg/dL (ref 7.9–10.2)
Chloride: 99 mEq/L — ABNORMAL LOW (ref 100–111)
Creatinine: 7.3 mg/dL — ABNORMAL HIGH (ref 0.7–1.3)
Glucose: 220 mg/dL — ABNORMAL HIGH (ref 70–100)
Potassium: 5.8 mEq/L — ABNORMAL HIGH (ref 3.5–5.1)
Sodium: 129 mEq/L — ABNORMAL LOW (ref 136–145)

## 2017-12-04 LAB — B-TYPE NATRIURETIC PEPTIDE: B-Natriuretic Peptide: 1110.2 pg/mL — ABNORMAL HIGH (ref 0.0–100.0)

## 2017-12-04 LAB — CBC
Absolute NRBC: 0 10*3/uL (ref 0.00–0.00)
Hematocrit: 25.2 % — ABNORMAL LOW (ref 37.6–49.6)
Hgb: 8.4 g/dL — ABNORMAL LOW (ref 12.5–17.1)
MCH: 31.1 pg (ref 25.1–33.5)
MCHC: 33.3 g/dL (ref 31.5–35.8)
MCV: 93.3 fL (ref 78.0–96.0)
MPV: 9.7 fL (ref 8.9–12.5)
Nucleated RBC: 0 /100 WBC (ref 0.0–0.0)
Platelets: 169 10*3/uL (ref 142–346)
RBC: 2.7 10*6/uL — ABNORMAL LOW (ref 4.20–5.90)
RDW: 13 % (ref 11–15)
WBC: 10.49 10*3/uL — ABNORMAL HIGH (ref 3.10–9.50)

## 2017-12-04 LAB — GFR: EGFR: 7.1

## 2017-12-04 LAB — PROCALCITONIN: Procalcitonin: 0.35 — ABNORMAL HIGH (ref 0.00–0.10)

## 2017-12-04 MED ORDER — NIFEDIPINE ER OSMOTIC RELEASE 30 MG PO TB24
60.00 mg | ORAL_TABLET | Freq: Every day | ORAL | Status: DC
Start: 2017-12-04 — End: 2017-12-05
  Administered 2017-12-04: 10:00:00 60 mg via ORAL

## 2017-12-04 MED ORDER — ONDANSETRON HCL 4 MG/2ML IJ SOLN
4.00 mg | Freq: Four times a day (QID) | INTRAMUSCULAR | Status: DC | PRN
Start: 2017-12-04 — End: 2017-12-08

## 2017-12-04 NOTE — Progress Notes (Signed)
[facility].  Progress Note    Assessment:   AKI- ATN cardiorenal ?   Acute on chronic CHF exacerbation due to ?non compliance   Hyponatremia- hypervolemic   Hyperkalemia   Anemia   Type II DM   Arthritis  Plan:   As he has worsening AKI on CKD will hold diuretics today. Ordered renal USG    Patient has completely refused hemodialysis. He understands the risk and knows that he may die and does not want to pursue HD if his renal function or breathing gets worse.   Lasix 120 mg IV if he has worsening SOB   Will decrease Colchicine to 0.3 mg daily   Dose medications with GFR<15 ml/min   Hold anti HTN medications if BP <120/90   Daily labs   Will follow    Marco Barker, MD  Office [phone]  ++++++++++++++++++++++++++++++++++++++++++++++++++++++++++++++  Subjective:  84 yo AAM with PMH  of CKD stage IV baseline Cr 2.6-8.3 Chronic systolic and diastolic CHF WF 41%, HTN, s/p pacemaker in 02/2016, DM, non compliance presented with right knee pain. He was found to have acute CHF exacerbation with 30 lbs weight gain and worsening renal failure.    His Cr on arrival 2.8-->3.5-->5.8-->7.3. He has received lasix 40 mg IV x 1. He has been hypotensive with BP in 96-222 systolic  Medications:  Scheduled Meds:  Current Facility-Administered Medications   Medication Dose Route Frequency   . albuterol-ipratropium  3 mL Nebulization TID   . allopurinol  100 mg Oral Daily   . aspirin  81 mg Oral Daily   . atorvastatin  20 mg Oral Daily   . azithromycin  500 mg Intravenous Q24H [place]   . cefTRIAXone  1 g Intravenous Q24H [place]   . colchicine  0.6 mg Oral Daily   . gabapentin  100 mg Oral QHS   . heparin (porcine)  5,000 Units Subcutaneous Q12H [facility]   . insulin glargine  16 Units Subcutaneous QHS   . insulin lispro  1-3 Units Subcutaneous QHS   . insulin lispro  1-5 Units Subcutaneous TID AC   . insulin lispro  5 Units Subcutaneous TID AC   . metoprolol succinate XL  25 mg Oral Daily    . NIFEdipine  60 mg Oral Daily   . pantoprazole  40 mg Oral QAM AC   . polyethylene glycol  17 g Oral Once   . predniSONE  20 mg Oral QAM W/BREAKFAST   . senna-docusate  1 tablet Oral BID     Continuous Infusions:  PRN Meds:Nursing communication: Adult Hypoglycemia Treatment Algorithm **AND** dextrose **AND** dextrose **AND** glucagon (rDNA), guaiFENesin, naloxone, ondansetron, polyethylene glycol    Objective:  Vital signs in last 24 hours:  Temp:  [97.5 F (36.4 C)-97.7 F (36.5 C)] 97.7 F (36.5 C)  Heart Rate:  [70-88] 70  Resp Rate:  [13-20] 15  BP: (92-130)/(43-60) 122/45  Intake/Output last 24 hours:    Intake/Output Summary (Last 24 hours) at 12/04/17 1019  Last data filed at 12/04/17 0541   Gross per 24 hour   Intake              240 ml   Output                0 ml   Net              240 ml     Intake/Output this shift:  No intake/output data recorded.  Physical Exam:   Gen: WD WN NAD   CV: S1 S2 N RRR   Chest: Coarse BS with few crackles at the bases   Ab: ND NT soft no HSM +BS   Ext: 1+ edema    Labs:    Recent Labs  Lab 12/04/17  0657 12/03/17  0626 12/02/17  0314 12/01/17  1733   Glucose 220* 258* 160* 96   BUN 104.4* 87.5* 54.7* 50.7*   Creatinine 7.3* 5.8* 3.5* 2.8*   Calcium 8.0 8.9 9.0 9.4   Sodium 129* 131* 135* 136   Potassium 5.8* 5.6* 5.4* 5.1   Chloride 99* 100 105 105   CO2 16* 16* 19* 23   Albumin  --   --   --  4.0   Phosphorus  --   --   --  4.4   Magnesium  --   --   --  2.0       Recent Labs  Lab 12/04/17  0657 12/02/17  0314 12/01/17  1733   WBC 10.49* 5.28 7.07   Hgb 8.4* 9.7* 10.5*   Hematocrit 25.2* 29.8* 31.7*   MCV 93.3 94.0 94.1   MCH 31.1 30.6 31.2   MCHC 33.3 32.6 33.1   RDW 13 13 13    MPV 9.7 10.3 10.8   Platelets 169 147 160

## 2017-12-04 NOTE — Plan of Care (Signed)
Problem: Everyday - Heart Failure  Goal: Stable Vital Signs and Fluid Balance  Outcome: Progressing   12/04/17 0040   Goal/Interventions addressed this shift   Stable Vital Signs and Fluid Balance Daily Standing Weights in the morning using the same scale, after using the bathroom and before breadfast. If unable to stand, zero the bed and use the bed scale;Monitor, assess vital signs and telemetry per policy;Assess for swelling/edema;Wean oxygen as needed if appropriate;Monitor labs and report abnormalities to physician;Strict Intake/Output;Fluid Restriction       Problem: Safety  Goal: Patient will be free from injury during hospitalization  Outcome: Progressing   12/04/17 0040   Goal/Interventions addressed this shift   Patient will be free from injury during hospitalization  Assess patient's risk for falls and implement fall prevention plan of care per policy;Provide and maintain safe environment;Ensure appropriate safety devices are available at the bedside;Include patient/ family/ care giver in decisions related to safety;Assess for patients risk for elopement and implement Elopement Risk Plan per policy;Provide alternative method of communication if needed (communication boards, writing)       Problem: Pain  Goal: Pain at adequate level as identified by patient  Outcome: Progressing   12/04/17 0040   Goal/Interventions addressed this shift   Pain at adequate level as identified by patient Identify patient comfort function goal;Assess for risk of opioid induced respiratory depression, including snoring/sleep apnea. Alert healthcare team of risk factors identified.;Assess pain on admission, during daily assessment and/or before any "as needed" intervention(s);Reassess pain within 30-60 minutes of any procedure/intervention, per Pain Assessment, Intervention, Reassessment (AIR) Cycle;Evaluate if patient comfort function goal is met;Offer non-pharmacological pain management interventions;Consult/collaborate with  Physical Therapy, Occupational Therapy, and/or Speech Therapy;Include patient/patient care companion in decisions related to pain management as needed       Comments: Pt remain on room air sats 97%;no distress;occasional non productive cough;no distress;c/o constipation tonight Dr. Trixie Rude made aware & he order Miralax  Daily prn given as ordered but no BM. Pt VSS;AV paced on tele;AOX4. Will continue plan of care.

## 2017-12-04 NOTE — Progress Notes (Addendum)
[place]    Date Time: 12/04/17 7:25 AM  Patient Name: Marco Bruce       Patient Active Problem List   Diagnosis   . Stage 4 chronic kidney disease   . Renovascular hypertension   . Hyponatremia   . Essential hypertension   . Acute on chronic diastolic congestive heart failure   . Hypokalemia   . Mixed hyperlipidemia   . Type 2 diabetes mellitus with complication, with long-term current use of insulin   . Acute gout of right knee   . Acute bronchitis due to other specified organisms   . Pacemaker       Assessment:      New leukocytosis overnight with elevated procalcitonin, on ABX for possible bronchitis per internal med   Admitted 8/29 with acute on chronic systolic and diastolic congestive heart failure.   ? EF 40% by echocardiogram in May of 2018.  ? ECHO 07/2017: LVEF 40%, Mild to moderate MR, mild AR.  ? 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   AKI on CKD IV    Hypertension-borderline hypotension   Diabetes.   Anemia.   Former tobacco use.   Poor compliance with follow-up.    Recommendations:    Holding diuretic and awaiting nephrology's input   BMP today pending   Mild volume overload on exam   Decrease Nifedipine due to low BP, continue BB     I have reviewed the notes, assessments, and/or procedures performed by Vassie Moselle, I concur with her/his documentation of Marco Bruce.  Pt seen, no le edema  cxr from yesterday shows improved pulm edema,  Cr 2.8-> 7.3, needs nephro consult for HD  Pt refusing HD  Very difficult situation, not sure what more we can office, obviously will need to hold off on diuretics  D/w Dr. Laddie Aquas nephrology and family told them they really need to consider HD  Dr. Vonzella Nipple      Medications:      Scheduled Meds: PRN Meds:      albuterol-ipratropium 3 mL Nebulization TID   allopurinol 100 mg Oral Daily   aspirin 81 mg Oral Daily   atorvastatin 20 mg Oral Daily   azithromycin 500 mg Intravenous Q24H [facility]    cefTRIAXone 1 g Intravenous Q24H [place]   colchicine 0.6 mg Oral Daily   gabapentin 100 mg Oral QHS   heparin (porcine) 5,000 Units Subcutaneous Q12H [place]   insulin glargine 16 Units Subcutaneous QHS   insulin lispro 1-3 Units Subcutaneous QHS   insulin lispro 1-5 Units Subcutaneous TID AC   insulin lispro 5 Units Subcutaneous TID AC   metoprolol succinate XL 25 mg Oral Daily   NIFEdipine 120 mg Oral Daily   pantoprazole 40 mg Oral QAM AC   polyethylene glycol 17 g Oral Once   predniSONE 20 mg Oral QAM W/BREAKFAST   senna-docusate 1 tablet Oral BID       Continuous Infusions:     dextrose 15 g of glucose PRN   And     dextrose 12.5 g PRN   And     glucagon (rDNA) 1 mg PRN   guaiFENesin 200 mg Q6H PRN   naloxone 0.2 mg PRN   ondansetron 4 mg Q6H PRN   polyethylene glycol 17 g Daily PRN             Subjective:   Denies chest pain or palpitations. Grandson at bedside to  translate, pt feels mildly SOB, + orthopnea      Physical Exam:     Vitals:    12/04/17 0441   BP: 108/50   Pulse: 76   Resp: 20   Temp: 97.5 F (36.4 C)   SpO2: 95%     Temp (24hrs), Avg:97.5 F (36.4 C), Min:97.5 F (36.4 C), Max:97.7 F (36.5 C)      Telemetry reviewed AV paced     Intake and Output Summary (Last 24 hours) at Date Time    Intake/Output Summary (Last 24 hours) at 12/04/17 0725  Last data filed at 12/04/17 0541   Gross per 24 hour   Intake              240 ml   Output                0 ml   Net              240 ml       General Appearance:  Breathing comfortable, no acute distress. Increased work of breathing when laid flat  Neck:  + jugular venous distension  Lungs:  + rales Left posterior base, good respiratory effort   Heart:  S1, S2 normal, no S3, no S4, no murmur  Abdomen:  Soft, non-tender, positive bowel sounds  Extremities:  No cyanosis, clubbing or edema  Pulses:  Equal radial pulses, 4/4 symmetric  Neurologic:  Alert and oriented x3, mood and affect normal  Musculoskeletal: normal strength and tone    Labs:     Recent  Labs  Lab 12/02/17  0314 12/01/17  2304 12/01/17  1732   Troponin I 0.03 0.03 0.02               Recent Labs  Lab 12/01/17  1733   Bilirubin, Total 0.4   Protein, Total 6.1   Albumin 4.0   ALT 6   AST (SGOT) 22       Recent Labs  Lab 12/01/17  1733   Magnesium 2.0           Recent Labs  Lab 12/04/17  0657 12/02/17  0314 12/01/17  1733   WBC 10.49* 5.28 7.07   Hgb 8.4* 9.7* 10.5*   Hematocrit 25.2* 29.8* 31.7*   Platelets 169 147 160       Recent Labs  Lab 12/03/17  0626 12/02/17  0314 12/01/17  1733   Sodium 131* 135* 136   Potassium 5.6* 5.4* 5.1   Chloride 100 105 105   CO2 16* 19* 23   BUN 87.5* 54.7* 50.7*   Creatinine 5.8* 3.5* 2.8*   EGFR 9.2 16.6 21.4   Glucose 258* 160* 96   Calcium 8.9 9.0 9.4           Invalid input(s): FREET4    .  Lab Results   Component Value Date    BNP 1,159.3 (H) 12/02/2017      Estimated Creatinine Clearance: 8.2 mL/min (A) (based on SCr of 5.8 mg/dL (H)).    Weight Monitoring 06/10/2017 07/04/2017 08/08/2017 12/01/2017 12/02/2017 12/03/2017 12/04/2017   Height - - 157.5 cm 157.5 cm - - -   Height Method - - - Stated - - -   Weight 72.9 kg 70.761 kg 76.658 kg 80.151 kg 87.8 kg 80.196 kg 85 kg   Weight Method Bed Scale - - Bed Scale Bed Scale Standing Scale Bed Scale   BMI (calculated) - - 31 kg/m2 32.4 kg/m2 - - -  Imaging:   Radiological Procedure reviewed.              Signed by: Mariana Kaufman, NP        [facility]  NP [place] (8am-5pm)  MD Spectralink [phone] (8am-5pm)  After hours, non urgent consult line [phone]  After Hours, urgent consults [phone]

## 2017-12-04 NOTE — Progress Notes (Addendum)
[facility] H[facility]PITAL[facility]  Progress Note  Patient Info:   Date/Time: 12/04/2017 / 2:08 PM   Admit Date:12/01/2017  Patient Name:Marco Bruce   HYQ:[id number]   PCP: Danella Sensing, MD  Attending Physician:Syndey Jaskolski, Lorin Glass, MD     Assessment and Plan:   Patient [facility] Problem List:   Acute on chronic diastolic congestive heart failure (08/13/2016)    Assessment: Patient's BNP is 1100.  His weight has gone up 30 pounds since his last admission about 6 months ago.  He denied any orthopnea or PND.  He does very little activity.  He denies any chest pains or palpitations.  He is unable to provide me with the name of the physician who is following him and directing his diuretic therapy.  Echocardiogram May 2018 showed 40% ejection fraction and T TEE 4/19 showed an ejection fraction of 40% with a mild to moderate MR and mild AR.  Patient has about chronic pacemaker that was placed in November 2017.  He is now off all cigarettes.    Plan: Appreciate consultation with cardiology.  Restart  diuretic therapy with indication of fluid overload despite wosening renal function.  He has chronic stage IV kidney disease.Have asked nephrology to follow with us.patient's wife died last year after being on dialysis and he declines this now.   Stage 4 chronic kidney disease ()    Assessment: She has been seen by [facility], Dayna Barker, MD in the past.he has chronic kidney disease dating back to 2007 with a baseline creatinine of 1.6.  UA has shown no hematuria.  Ultrasound of the kidneys 08/20/16 show in the right kidney to be 10 cm and the left 10.5 cm. Patient very fluid overloaded.    Plan: Kidney function worsened.Nephrology consulted and I have spoken with Dr Laddie Aquas today.Family declines dialysis.1262mL fluid restriction. Lasix ok. Hospice may be required but daughter cannot take this in so soon after loss of mother last year.she was against ventilator but unable to say DNR.    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  His blood pressure today was 102/57 with a pulse of 80    Plan: No change in medications   Mixed hyperlipidemia ()    Assessment: Patient is on Lipitor 20 mg daily   plan: No changes in medication   Type 2 diabetes mellitus with complication, with long-term current use of insulin ()    Assessment: Last A1c was done February 2019 and was 7.6    Plan: We will continue to adjust medications.  Continue with carbohydrate consistent diet   Acute gout of right knee (06/05/2017)    Assessment: Pain is improving with the corticosteroids that were provided for the patient's wheezing    Plan: Decrease of corticosteroids and continue the patient on allopurinol and the addition of colchicine.  Corticosteroids can sometimes limit the fluid diuresis and has been decreased to 20mg    Acute bronchitis due to other specified organisms (12/01/2017)    Assessment: Chest x-ray negative other than a chronically elevated right hemidiaphragm.  Patient is wheezing.Procalcitonin elevated at 0.35. Chest XR with question right basilar infiltrate    Plan:started antibiotics 12/03/17 with patient having chills rigors and sweats.   Pacemaker (12/01/2017)    Assessment: This is placed on the left side last year and appears to be functioning well on the telemetry monitor.  The wound site is well-healed    Plan: We will monitor  DVT Prohylaxis:heparin and SEDs   Central Line/Foley Catheter/PICC line status: No  Code Status: Full Code  Disposition:home  Type of Admission:Inpatient  Expected Date of Discharge: 48 hours  Milestones required for discharge: Work on diuresis and monitor renal function closely.  Patient has had a cardiology consultation and may need renal consultation as well.  We will watch the electrolytes and renal function closely.  PT OT consult to see if the patient is cleared to take care of himself at home.    Subjective:    Chief Complaint:  Shortness of Breath and Knee Pain  Patient originally [place] from [place].  He does not speak any [place].  He is asked that his grandson and daughter do the translating.  I offered him an interpreter.  She had an episode of mucus plugging earlier today.  Is in the room when he suddenly could not breathe and turned somewhat red in the face.  Brought into a sitting position and he felt better.  Stat neb treatment was provided and chest x-ray showed only the right hemidiaphragm and evidence of CHF.  No signs of pneumonia.  She was complaining of sweats and chills.    ROS  Objective:     Vitals:    12/04/17 0747 12/04/17 1015 12/04/17 1257 12/04/17 1300   BP:  122/45 143/72 123/72   Pulse: 73 70 81 72   Resp:  15  18   Temp:  97.7 F (36.5 C)  98.8 F (37.1 C)   TempSrc:  Oral     SpO2: 95% 95% 96% 97%   Weight:       Height:           Intake/Output Summary (Last 24 hours) at 12/04/17 1408  Last data filed at 12/04/17 0541   Gross per 24 hour   Intake              240 ml   Output                0 ml   Net              240 ml    Physical Exam   Constitutional: He is oriented to person, place, and time.  As per his interpreter which is his grandson he appears well-developed and well-nourished.mild respiratory distress.  He is smiling and pleasant.  There is an exhalation wheeze that is audible at the bedside. Patient is mucous plugging. Daughter notes greenish mucous production. No sweats  HENT:   Head: Normocephalic and atraumatic.   Eyes: Pupils are equal, round, and reactive to light.   Neck: Normal range of motion. Neck supple. No JVD present.   Cardiovascular: Normal rate, regular rhythm, normal heart sounds and intact distal pulses.    Pulmonary/Chest: Effort normal. He has softer wheezes (expiratory). He has no rales. He exhibits no tenderness.  Dullness at the right base to percussion  Abdominal: Soft. Bowel sounds are normal. He exhibits no distension and no mass. There is no tenderness.  There is no guarding.   Musculoskeletal: Normal range of motion. He exhibits no edema.  There is mild to moderate tenderness of a mildly warm right knee.  There is also mild effusion of the right knee.  No signs of gout in the feet.  Lymphadenopathy:     He has no cervical, clavicular, axillary, or inguinal adenopathy.   Neurological: He is alert and oriented to person, place, and time. He displays normal reflexes. No cranial  nerve deficit. Coordination normal.   Skin: Skin is warm.   Psychiatric: He has a normal mood and affect.     Medications:      Scheduled Meds: PRN Meds:        albuterol-ipratropium 3 mL Nebulization TID   allopurinol 100 mg Oral Daily   aspirin 81 mg Oral Daily   atorvastatin 20 mg Oral Daily   azithromycin 500 mg Intravenous Q24H SCH   cefTRIAXone 1 g Intravenous Q24H SCH   colchicine 0.6 mg Oral Daily   gabapentin 100 mg Oral QHS   heparin (porcine) 5,000 Units Subcutaneous Q12H [place]   insulin glargine 16 Units Subcutaneous QHS   insulin lispro 1-3 Units Subcutaneous QHS   insulin lispro 1-5 Units Subcutaneous TID AC   insulin lispro 5 Units Subcutaneous TID AC   metoprolol succinate XL 25 mg Oral Daily   NIFEdipine 60 mg Oral Daily   pantoprazole 40 mg Oral QAM AC   polyethylene glycol 17 g Oral Once   predniSONE 20 mg Oral QAM W/BREAKFAST   senna-docusate 1 tablet Oral BID       Continuous Infusions:     dextrose 15 g of glucose PRN   And     dextrose 12.5 g PRN   And     glucagon (rDNA) 1 mg PRN   guaiFENesin 200 mg Q6H PRN   naloxone 0.2 mg PRN   ondansetron 4 mg Q6H PRN   polyethylene glycol 17 g Daily PRN           Results of Labs/imaging   Labs and radiology reports have been reviewed.    Hospitalist   Signed by:   Timoteo Gaul  12/04/2017 2:08 PM    *This note was generated by the Epic EMR system/ Dragon speech recognition and may contain inherent errors or omissions not intended by the user. Grammatical errors, random word insertions, deletions, pronoun errors and incomplete  sentences are occasional consequences of this technology due to software limitations. Not all errors are caught or corrected. If there are questions or concerns about the content of this note or information contained within the body of this dictation they should be addressed directly with the author for clarification

## 2017-12-04 NOTE — Plan of Care (Signed)
Problem: Everyday - Heart Failure  Goal: Stable Vital Signs and Fluid Balance  Outcome: Progressing   12/04/17 2327   Goal/Interventions addressed this shift   Stable Vital Signs and Fluid Balance Daily Standing Weights in the morning using the same scale, after using the bathroom and before breadfast. If unable to stand, zero the bed and use the bed scale;Monitor, assess vital signs and telemetry per policy;Monitor labs and report abnormalities to physician;Wean oxygen as needed if appropriate;Assess for swelling/edema;Strict Intake/Output;Fluid Restriction       Problem: Safety  Goal: Patient will be free from injury during hospitalization  Outcome: Progressing   12/04/17 2327   Goal/Interventions addressed this shift   Patient will be free from injury during hospitalization  Assess patient's risk for falls and implement fall prevention plan of care per policy;Provide and maintain safe environment;Use appropriate transfer methods;Ensure appropriate safety devices are available at the bedside;Include patient/ family/ care giver in decisions related to safety;Hourly rounding;Assess for patients risk for elopement and implement Elopement Risk Plan per policy     Pt and grandson encouraged to call for any assistance. Call bell placed within reach.Non skid socks on. Hourly rounding will be maintained .     Problem: Pain  Goal: Pain at adequate level as identified by patient  Outcome: Progressing   12/04/17 2327   Goal/Interventions addressed this shift   Pain at adequate level as identified by patient Identify patient comfort function goal;Assess for risk of opioid induced respiratory depression, including snoring/sleep apnea. Alert healthcare team of risk factors identified.;Assess pain on admission, during daily assessment and/or before any "as needed" intervention(s);Reassess pain within 30-60 minutes of any procedure/intervention, per Pain Assessment, Intervention, Reassessment (AIR) Cycle;Evaluate if patient  comfort function goal is met;Evaluate patient's satisfaction with pain management progress;Consult/collaborate with Pain Service;Include patient/patient care companion in decisions related to pain management as needed;Offer non-pharmacological pain management interventions     Pt denies any pain at this time, will continue to assess and help manage.

## 2017-12-05 LAB — GLUCOSE WHOLE BLOOD - POCT
Whole Blood Glucose POCT: 225 mg/dL — ABNORMAL HIGH (ref 70–100)
Whole Blood Glucose POCT: 190 mg/dL — ABNORMAL HIGH (ref 70–100)
Whole Blood Glucose POCT: 118 mg/dL — ABNORMAL HIGH (ref 70–100)

## 2017-12-05 LAB — CBC
Absolute NRBC: 0 10*3/uL (ref 0.00–0.00)
Hematocrit: 26.4 % — ABNORMAL LOW (ref 37.6–49.6)
Hgb: 8.9 g/dL — ABNORMAL LOW (ref 12.5–17.1)
MCH: 31.1 pg (ref 25.1–33.5)
MCHC: 33.7 g/dL (ref 31.5–35.8)
MCV: 92.3 fL (ref 78.0–96.0)
MPV: 10.4 fL (ref 8.9–12.5)
Nucleated RBC: 0 /100 WBC (ref 0.0–0.0)
Platelets: 165 10*3/uL (ref 142–346)
RBC: 2.86 10*6/uL — ABNORMAL LOW (ref 4.20–5.90)
RDW: 13 % (ref 11–15)
WBC: 9.23 10*3/uL (ref 3.10–9.50)

## 2017-12-05 LAB — BASIC METABOLIC PANEL
Anion Gap: 13 (ref 5.0–15.0)
BUN: 107.5 mg/dL — ABNORMAL HIGH (ref 9.0–28.0)
CO2: 15 mEq/L — ABNORMAL LOW (ref 22–29)
Calcium: 7.7 mg/dL — ABNORMAL LOW (ref 7.9–10.2)
Chloride: 97 mEq/L — ABNORMAL LOW (ref 100–111)
Creatinine: 7.1 mg/dL — ABNORMAL HIGH (ref 0.7–1.3)
Glucose: 239 mg/dL — ABNORMAL HIGH (ref 70–100)
Potassium: 5.8 mEq/L — ABNORMAL HIGH (ref 3.5–5.1)
Sodium: 125 mEq/L — ABNORMAL LOW (ref 136–145)

## 2017-12-05 LAB — GFR: EGFR: 7.3

## 2017-12-05 LAB — WHOLE BLOOD GLUCOSE POCT: Whole Blood Glucose POCT: 165 mg/dL — ABNORMAL HIGH (ref 70–100)

## 2017-12-05 MED ORDER — SODIUM BICARBONATE 650 MG PO TABS
1300.00 mg | ORAL_TABLET | Freq: Two times a day (BID) | ORAL | Status: DC
Start: 2017-12-05 — End: 2017-12-08
  Administered 2017-12-05 – 2017-12-08 (×7): 1300 mg via ORAL
  Filled 2017-12-05 (×7): qty 2

## 2017-12-05 MED ORDER — NIFEDIPINE ER OSMOTIC RELEASE 30 MG PO TB24
30.00 mg | ORAL_TABLET | Freq: Every day | ORAL | Status: DC
Start: 2017-12-05 — End: 2017-12-06
  Administered 2017-12-05: 10:00:00 30 mg via ORAL
  Filled 2017-12-05 (×2): qty 1

## 2017-12-05 MED ORDER — DEXTROMETHORPHAN-GUAIFENESIN ER 30-600 MG PO TB12
1.00 | ORAL_TABLET | Freq: Two times a day (BID) | ORAL | Status: DC | PRN
Start: 2017-12-05 — End: 2017-12-08
  Administered 2017-12-05 – 2017-12-06 (×2): 1 via ORAL
  Filled 2017-12-05 (×3): qty 1

## 2017-12-05 MED ORDER — PREDNISONE 10 MG PO TABS
10.00 mg | ORAL_TABLET | Freq: Every morning | ORAL | Status: DC
Start: 2017-12-06 — End: 2017-12-06
  Administered 2017-12-06: 08:00:00 10 mg via ORAL
  Filled 2017-12-05: qty 1

## 2017-12-05 MED ORDER — SENNOSIDES-DOCUSATE SODIUM 8.6-50 MG PO TABS
2.00 | ORAL_TABLET | Freq: Two times a day (BID) | ORAL | Status: DC
Start: 2017-12-05 — End: 2017-12-08
  Administered 2017-12-05 – 2017-12-08 (×7): 2 via ORAL
  Filled 2017-12-05 (×7): qty 2

## 2017-12-05 MED ORDER — ACETYLCYSTEINE 10 % IN SOLN
3.00 mL | Freq: Two times a day (BID) | RESPIRATORY_TRACT | Status: AC
Start: 2017-12-05 — End: 2017-12-07
  Administered 2017-12-05 – 2017-12-07 (×4): 3 mL via RESPIRATORY_TRACT
  Filled 2017-12-05 (×5): qty 4

## 2017-12-05 NOTE — PT Eval Note (Signed)
[facility]  [place], [place]    Department of Rehabilitation  [phone]    Physical Therapy Evaluation    Patient: Marco Bruce    MRN#: [id number]     [id number]    Time of treatment: Time Calculation  PT Received On: 12/05/17  Start Time: 2330  Stop Time: 1435  Time Calculation (min): 31 min  Total Treatment Time (min): 30    PT Visit Number: 1    Consult received for Marco Bruce for PT Evaluation and Treatment.  Patient's medical condition is appropriate for Physical therapy intervention at this time.      Assessment:   Cutter Passey is a 84 y.o. male admitted 12/01/2017.  Pt's functional mobility is impacted by:  decreased activity tolerance and decreased safety awareness.  There are a few comorbidities or other factors that affect plan of care and require modification of task including: assistive device needed for mobility, oxygen dependency and has stairs to manage.  Standardized tests and exams incorporated into evaluation include AMPAC mobility.  Pt demonstrates a stable clinical presentation. Pt would continue to benefit from PT to address these deficits and increase functional independence.     Complexity Level Hx and Co  morbidites Examination Clinical Decision Making Clinical Presentation   Low no impact 1-2 elements Limited options Stable   Moderate   1-2 factors 3 or more   Several options Evolving, plan may alter   High 3 or more 4 or more Multiple options Unstable, unpredictable       Impairments: Assessment: Decreased LE strength;Decreased endurance/activity tolerance;Decreased functional mobility.     Therapy Diagnosis: generalized weakness, decreased functional mobility , decreased endurance/ activity engagement and decreased safety awareness due to presentation at the time of initial assessment. Without therapy interventions, patient is at risk for dependence on caregivers for mobility, dependence on caregivers for ADL's, failure to return to PLOF and decreased  quality of life.    Rehabilitation Potential: Prognosis: Good;Ongoing PT assessment needed;With continued PT status post acute discharge      Plan:    Treatment/Interventions: Exercise;Gait training;Stair training;Neuromuscular re-education;Functional transfer training;LE strengthening/ROM;Endurance training;Patient/family training;Bed mobility PT Frequency: 3-4x/wk    Risks/Benefits/POC Discussed with Pt/Family: With patient/family          Goals:   Goals  Goal Formulation: With patient/family  Time for Goal Acheivement: By time of discharge  Goals: Select goal  Pt Will Perform Sit to Stand: with contact guard assist;to maximize functional mobility and independence;3 visits  Pt Will Ambulate: 151-200 feet;with rolling walker;with stand by assist;to maximize functional mobility and independence;5 visits  Pt Will Go Up / Down Stairs: 1 flight;with stand by assist;With rail;to maximize functional mobility and independence;7 visits      Discharge Recommendations:   Based on today's session patient's discharge recommendation is the following: Discharge Recommendation: Home with home health PT        If Discharge Recommendation: Home with home health PT         Precautions and Contraindications:   Precautions  Weight Bearing Status: no restrictions  Other Precautions: fall    Medical Diagnosis: Shortness of breath [R06.02]  Primary osteoarthritis of right knee [M17.11]  Acute renal failure superimposed on stage 4 chronic kidney disease, unspecified acute renal failure type [N17.9, N18.4]  Acute on chronic congestive heart failure, unspecified heart failure type [I50.9]    History of Present Illness: Marco Bruce is a 84 y.o. male admitted on 12/01/2017 with acute on chronic  CHF, SOB    Patient Active Problem List   Diagnosis   . Stage 4 chronic kidney disease   . Renovascular hypertension   . Hyponatremia   . Essential hypertension   . Acute on chronic diastolic congestive heart failure   . Hypokalemia   . Mixed  hyperlipidemia   . Type 2 diabetes mellitus with complication, with long-term current use of insulin   . Acute gout of right knee   . Acute renal failure superimposed on stage 3 chronic kidney disease   . Acute bronchitis due to other specified organisms   . Pacemaker   . Bronchitis   . Diaphragm paralysis        Past Medical/Surgical History:  Past Medical History:   Diagnosis Date   . Acute diastolic heart failure 03/20/2445   . Acute on chronic diastolic congestive heart failure 08/13/2016   . Acute pulmonary edema 05/26/2015   . ARF (acute renal failure) 05/26/2015   . Chronic diastolic heart failure 95/10/2255   . Chronic gout    . Chronic pain syndrome 07/07/2015   . CKD (chronic kidney disease) stage 4, GFR 15-29 ml/min     2018 baseline creatinine in EPIC is 1.5-2.3.   . Congestive heart failure (CHF)     EF 40% 5/18 echo, on lasix   . Current chronic use of systemic steroids     For Polymyalgia rheumatica   . Essential hypertension    . Mixed hyperlipidemia    . NSTEMI (non-ST elevated myocardial infarction) 08/13/2016   . Osteoarthritis of both knees    . Peripheral edema 06/16/2015   . Pneumonia due to infectious organism, unspecified laterality, unspecified part of lung 06/25/2015   . Polymyalgia rheumatica     chronic steroids   . 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 Second degree heart block by electrocardiogram (ECG) 04/27/2015   . Type 2 diabetes mellitus with complication, with long-term current use of insulin     12/01/2016 Hemoglobin A1c 8.2%      Past Surgical History:   Procedure Laterality Date   . ABDOMINAL SURGERY     . CHOLECYSTECTOMY     . PACEMAKER Left 2017         X-Rays/Tests/Labs:  No results found.      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 Family and/or guardian are agreeable to patient's participation in the therapy session. Nursing clears patient for therapy.   Patient Goal  Patient Goal: return home       Objective:   Observation of  Patient/Vital Signs:  Patient is in bed with telemetry and O2 at 2 liters/minute via nasal cannula in place.         Cognition/Neuro Status  Arousal/Alertness: Appropriate responses to stimuli  Attention Span: Appears intact  Orientation Level: Oriented X4  Memory: Appears intact  Following Commands: Follows one step commands with repetition (reqiures interpretation)  Safety Awareness: minimal verbal instruction  Insights: Decreased awareness of deficits  Problem Solving: Assistance required to generate solutions;Assistance required to identify errors made  Comments: family interpreted for communication  Behavior: calm;cooperative  Motor Planning: intact  Coordination: intact      Hearing: WNL  Vision: WNL  Sensation: WNL    Gross ROM  Right Lower Extremity ROM: within functional limits  Left Lower Extremity ROM: within functional limits  Gross Strength  Right Lower Extremity Strength: 4+/5  Left Lower Extremity Strength: 4+/5  Tone  Tone: within functional  limits    Functional Mobility  Sit to Stand: Moderate Assist;Increased Time;Increased Effort;with instruction for hand placement to increase safety  Stand to Sit: Minimal Assist     Locomotion  Ambulation: Contact Guard Assist;with front-wheeled walker  Pattern: decreased cadence;decreased step length  Distance Walked (ft) (Step 6,7): 60 Feet  PMP Activity: Step 7 - Walks out of Room     Balance  Balance: needs focused assessment  Sitting - Static: Good  Sitting - Dynamic: Good  Standing - Static: Fair  Standing - Dynamic: Fair    Participation and Endurance  Participation Effort: excellent  Endurance: Tolerates < 10 min exercise with changes in vital signs  [place] Dyspnea Scale: 2+ Dyspnea         AM-PACT Inpatient Short Forms  Inpatient AM-PACT Performed? (PT): Basic Mobility Inpatient Short Form  AM-PACT "6 Clicks" Basic Mobility Inpatient Short Form  Turning Over in Bed: A little  Sitting Down On/Standing From Armchair: A little  Lying on Back to Sitting on Side of Bed: A little  Assist Moving to/from Bed to Chair: A little  Assist to Walk in Hospital Room: A little  Assist to Climb 3-5 Steps with Railing: A lot  PT Basic Mobility Raw Score: 17  CMS 0-100% Score: 50.57%    Treatment Activities: Therapeutic exercise and HEP training included seated ankle pumps, LAQ, and seated march. Patient educated on pursed lip breathing through seated therapeutic exercise. Following ambulation on 2 L O2 NC pt's SPO2 was 78%, fluctuated between 78% and 84% with therapeutic exercise. With rest and cueing for pursed lip breathing pt unable to return to normal O2 levels, increased NC O2 to 3L, O2 returned to 94% at rest. RN notified of changes in O2. Pt cued for safety with use of FWW and stand to sit, sequencing and hand placement.     Educated the patient to role of physical therapy, plan of care, goals of therapy and HEP, safety with mobility and ADLs, energy conservation techniques, pursed lip breathing.    RN  notified of session outcome. Pt left with family, sitting in bedside chair.     Hessie Dibble, PT, DPT

## 2017-12-05 NOTE — Progress Notes (Signed)
[facility].  Progress Note    Assessment:   AKI- ATN cardiorenal ?   Acute on chronic CHF exacerbation due to ?non compliance   Hyponatremia- hypervolemic   Hyperkalemia   Anemia   Type II DM   Arthritis    Plan:   As he has worsening AKI on CKD will hold diuretics today. Ordered renal USG    Patient has completely refused hemodialysis. He understands the risk and knows that he may die and does not want to pursue HD if his renal function or breathing gets worse.   Will discontinue MiraLAX as needed since might not be safe and advanced kidney disease   Lasix 120 mg IV if he has worsening SOB   Low potassium diet   Dose medications with GFR<15 ml/min   Hold anti HTN medications if BP <120/90   Daily labs    Marco Kaufmann, MD  Office [phone]  ++++++++++++++++++++++++++++++++++++++++++++++++++++++++++++++  Subjective:  No new complaints    Medications:  Scheduled Meds:  Current Facility-Administered Medications   Medication Dose Route Frequency   . albuterol-ipratropium  3 mL Nebulization TID   . allopurinol  100 mg Oral Daily   . aspirin  81 mg Oral Daily   . atorvastatin  20 mg Oral Daily   . azithromycin  500 mg Intravenous Q24H [place]   . cefTRIAXone  1 g Intravenous Q24H [facility]   . gabapentin  100 mg Oral QHS   . heparin (porcine)  5,000 Units Subcutaneous Q12H [facility]   . insulin glargine  16 Units Subcutaneous QHS   . insulin lispro  1-3 Units Subcutaneous QHS   . insulin lispro  1-5 Units Subcutaneous TID AC   . insulin lispro  5 Units Subcutaneous TID AC   . metoprolol succinate XL  25 mg Oral Daily   . NIFEdipine  30 mg Oral Daily   . pantoprazole  40 mg Oral QAM AC   . polyethylene glycol  17 g Oral Once   . predniSONE  20 mg Oral QAM W/BREAKFAST   . senna-docusate  1 tablet Oral BID   . sodium bicarbonate  1,300 mg Oral BID     Continuous Infusions:  PRN Meds:Nursing communication: Adult Hypoglycemia Treatment Algorithm **AND** dextrose **AND** dextrose  **AND** glucagon (rDNA), guaiFENesin, naloxone, ondansetron, polyethylene glycol    Objective:  Vital signs in last 24 hours:  Temp:  [97.2 F (36.2 C)-98.8 F (37.1 C)] 98.2 F (36.8 C)  Heart Rate:  [63-81] 63  Resp Rate:  [14-23] 17  BP: (95-143)/(54-72) 119/61  Intake/Output last 24 hours:    Intake/Output Summary (Last 24 hours) at 12/05/17 1253  Last data filed at 12/05/17 0520   Gross per 24 hour   Intake              100 ml   Output               50 ml   Net               50 ml     Intake/Output this shift:  No intake/output data recorded.    Physical Exam:   Gen: WD WN NAD   CV: S1 S2 N RRR   Chest: Crackles at bases   Ab: ND NT soft no HSM +BS   Ext: + edema    Labs:    Recent Labs  Lab 12/05/17  0644 12/04/17  0657 12/03/17  0626  12/01/17  1733   Glucose 239* 220* 258* More results in Results Review 96   BUN 107.5* 104.4* 87.5* More results in Results Review 50.7*   Creatinine 7.1* 7.3* 5.8* More results in Results Review 2.8*   Calcium 7.7* 8.0 8.9 More results in Results Review 9.4   Sodium 125* 129* 131* More results in Results Review 136   Potassium 5.8* 5.8* 5.6* More results in Results Review 5.1   Chloride 97* 99* 100 More results in Results Review 105   CO2 15* 16* 16* More results in Results Review 23   Albumin  --   --   --   --  4.0   Phosphorus  --   --   --   --  4.4   Magnesium  --   --   --   --  2.0   More results in Results Review = values in this interval not displayed.    Recent Labs  Lab 12/05/17  0644 12/04/17  0657 12/02/17  0314   WBC 9.23 10.49* 5.28   Hgb 8.9* 8.4* 9.7*   Hematocrit 26.4* 25.2* 29.8*   MCV 92.3 93.3 94.0   MCH 31.1 31.1 30.6   MCHC 33.7 33.3 32.6   RDW 13 13 13    MPV 10.4 9.7 10.3   Platelets 165 169 147

## 2017-12-05 NOTE — Progress Notes (Signed)
[facility]  Progress Note  Patient Info:   Date/Time: 12/05/2017 / 5:50 PM   Admit Date:12/01/2017  Patient Name:Marco Bruce   ENI:[id number]   PCP: Danella Sensing, MD  Attending Physician:Winthrop Shannahan, Lorin Glass, MD     Assessment and Plan:   Patient [name] Problem List:   Acute on chronic diastolic congestive heart failure (08/13/2016)    Assessment: Patient's BNP is 1100.  His weight has gone up 30 pounds since his last admission about 6 months ago.  He denied any orthopnea or PND.  He does very little activity.  He denies any chest pains or palpitations.  He is unable to provide me with the name of the physician who is following him and directing his diuretic therapy.  Echocardiogram May 2018 showed 40% ejection fraction and T TEE 4/19 showed an ejection fraction of 40% with a mild to moderate MR and mild AR.  Patient has about chronic pacemaker that was placed in November 2017.  He is now off all cigarettes.    Plan: Appreciate consultation with cardiology.  Restart  diuretic therapy with indication of fluid overload despite wosening renal function.  He has chronic stage IV kidney disease.Have asked nephrology to follow with us.patient's wife died last year after being on dialysis and he declines this now.   Stage 4 chronic kidney disease ()    Assessment: He has been seen by [facility], Dayna Barker, MD in the past. He has chronic kidney disease dating back to 2007 with a baseline creatinine of 1.6.  UA has shown no hematuria.  Ultrasound of the kidneys 08/20/16 show in the right kidney to be 10 cm and the left 10.5 cm. Patient very fluid overloaded.    Plan: Kidney function worsened.Appreciate nephrology consultation.Family and patient continue to decline dialysis.1293mL fluid restriction strictly observed by daughter who is at the bedside.. Lasix held. Hospice may be required but daughter cannot take this in so soon after loss of mother last  year.she was against ventilator but unable to say DNR.  Ultrasound ordered.daughter declines Foley catheter for the patient.  He had this placed in the past and had significant troubles with his urinary stream after that.  He is voiding well by her account.  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  His blood pressure today was 102/57 with a pulse of 80    Plan: We will decrease the nifedipine and effort to increase blood flow to the kidney   Mixed hyperlipidemia ()    Assessment: Patient is on Lipitor 20 mg daily   plan: No changes in medication   Type 2 diabetes mellitus with complication, with long-term current use of insulin ()    Assessment: Last A1c was done February 2019 and was 7.6    Plan: We will continue to adjust medications.  Continue with carbohydrate consistent diet   Acute gout of right knee (06/05/2017)    Assessment: Pain is improving with the corticosteroids that were provided for the patient's wheezing    Plan: Decrease of corticosteroids and continue the patient on allopurinol and hold addition of colchicine made after admission.  Corticosteroids can sometimes limit the fluid diuresis and has been decreased to 10mg    Acute bronchitis due to other specified organisms (12/01/2017)    Assessment: Chest x-ray negative other than a chronically elevated  right hemidiaphragm.  Patient is wheezing with the infection and fluid overload.Procalcitonin elevated at 0.35. Chest XR with question right basilar infiltrate    Plan:started antibiotics 12/03/17 with patient having chills rigors and sweats for the first 24 hours.  This is now cleared..   Pacemaker (12/01/2017)    Assessment: This is placed on the left side last year and appears to be functioning well on the telemetry monitor.  The wound site is well-healed    Plan: We will monitor      DVT Prohylaxis:heparin and  SEDs   Central Line/Foley Catheter/PICC line status: No  Code Status: Full Code  Disposition:home  Type of Admission:Inpatient  Expected Date of Discharge: 48 hours  Milestones required for discharge: Work on diuresis and monitor renal function closely.  Patient has had a cardiology consultation and may need renal consultation as well.  We will watch the electrolytes and renal function closely.  PT OT consult to see if the patient is cleared to take care of himself at home.    Subjective:   Chief Complaint:  Shortness of Breath and Knee Pain  Patient originally [place] from [place].  He does not speak any [place].  He is asked that his grandson and daughter do the translating.  I offered him an interpreter.  She had an episode of mucus plugging earlier today.  Is in the room when he suddenly could not breathe and turned somewhat red in the face.  Brought into a sitting position and he felt better.  Stat neb treatment was provided and chest x-ray showed only the right hemidiaphragm and evidence of CHF.  No signs of pneumonia.  She was complaining of sweats and chills.    ROS  Objective:     Vitals:    12/05/17 0746 12/05/17 1022 12/05/17 1302 12/05/17 1715   BP:  119/61 121/69 138/68   Pulse: 72 63 78 73   Resp: 15 17 22 22    Temp:  98.2 F (36.8 C) 98.4 F (36.9 C) 98.2 F (36.8 C)   TempSrc:  Oral Oral Oral   SpO2: 97% 100% 96% 98%   Weight:       Height:           Intake/Output Summary (Last 24 hours) at 12/05/17 1750  Last data filed at 12/05/17 0520   Gross per 24 hour   Intake              100 ml   Output               50 ml   Net               50 ml    Physical Exam   Constitutional: He is oriented to person, place, and time.  As per his interpreter which is his grandson he appears well-developed and well-nourished.mild respiratory distress.  He is smiling and pleasant.  There is an exhalation wheeze that is audible at the bedside. Patient is mucous plugging. Daughter notes greenish mucous production. No  sweats now.  Daughter feels that the patient is better today.  We did decrease his gabapentin to bedtime alone.  He is more alert  HENT:   Head: Normocephalic and atraumatic.   Eyes: Pupils are equal, round, and reactive to light.   Neck: Normal range of motion. Neck supple. No JVD present.   Cardiovascular: Normal rate, regular rhythm, normal heart sounds and intact distal pulses.    Pulmonary/Chest: Effort normal. He has  softer wheezes (expiratory). He has no rales. He exhibits no tenderness.  Dullness at the right base to percussion due to chronic elevation of the right hemidiaphragm  Abdominal: Soft. Bowel sounds are normal. He exhibits no distension and no mass. There is no tenderness. There is no guarding.   Musculoskeletal: Normal range of motion. He exhibits no edema.  There is mild to moderate tenderness of a mildly warm right knee.  There is also mild effusion of the right knee.  No signs of gout in the feet.  Lymphadenopathy:     He has no cervical, clavicular, axillary, or inguinal adenopathy.   Neurological: He is alert and oriented to person, place, and time. He displays normal reflexes. No cranial nerve deficit. Coordination normal.   Skin: Skin is warm.   Psychiatric: He has a normal mood and affect.     Medications:      Scheduled Meds: PRN Meds:        acetylcysteine 3 mL Nebulization BID   albuterol-ipratropium 3 mL Nebulization TID   allopurinol 100 mg Oral Daily   aspirin 81 mg Oral Daily   atorvastatin 20 mg Oral Daily   azithromycin 500 mg Intravenous Q24H [facility]   cefTRIAXone 1 g Intravenous Q24H [facility]   gabapentin 100 mg Oral QHS   heparin (porcine) 5,000 Units Subcutaneous Q12H [place]   insulin glargine 16 Units Subcutaneous QHS   insulin lispro 1-3 Units Subcutaneous QHS   insulin lispro 1-5 Units Subcutaneous TID AC   insulin lispro 5 Units Subcutaneous TID AC   metoprolol succinate XL 25 mg Oral Daily   NIFEdipine 30 mg Oral Daily   pantoprazole 40 mg Oral QAM AC   polyethylene glycol 17 g Oral  Once   predniSONE 20 mg Oral QAM W/BREAKFAST   senna-docusate 2 tablet Oral BID   sodium bicarbonate 1,300 mg Oral BID       Continuous Infusions:     dextromethorphan-guaiFENesin 1 tablet BID PRN   dextrose 15 g of glucose PRN   And     dextrose 12.5 g PRN   And     glucagon (rDNA) 1 mg PRN   guaiFENesin 200 mg Q6H PRN   naloxone 0.2 mg PRN   ondansetron 4 mg Q6H PRN           Results of Labs/imaging   Labs and radiology reports have been reviewed.    Hospitalist   Signed by:   Timoteo Gaul  12/05/2017 5:50 PM    *This note was generated by the Epic EMR system/ Dragon speech recognition and may contain inherent errors or omissions not intended by the user. Grammatical errors, random word insertions, deletions, pronoun errors and incomplete sentences are occasional consequences of this technology due to software limitations. Not all errors are caught or corrected. If there are questions or concerns about the content of this note or information contained within the body of this dictation they should be addressed directly with the author for clarification

## 2017-12-05 NOTE — Plan of Care (Signed)
Problem: Moderate/High Fall Risk Score >5  Goal: Patient will remain free of falls  Outcome: Progressing   12/05/17 2219   High Risk Falls Interventions (Greater than 13)   VH High Risk (Greater than 13) ALL REQUIRED LOW INTERVENTIONS       Problem: Everyday - Heart Failure  Goal: Stable Vital Signs and Fluid Balance  Outcome: Progressing   12/05/17 2219   Goal/Interventions addressed this shift   Stable Vital Signs and Fluid Balance Daily Standing Weights in the morning using the same scale, after using the bathroom and before breadfast. If unable to stand, zero the bed and use the bed scale;Monitor, assess vital signs and telemetry per policy;Monitor labs and report abnormalities to physician;Strict Intake/Output;Fluid Restriction;Wean oxygen as needed if appropriate;Assess for swelling/edema       Problem: Compromised Tissue integrity  Goal: Damaged tissue is healing and protected  Outcome: Progressing   12/05/17 2219   Goal/Interventions addressed this shift   Damaged tissue is healing and protected  Monitor/assess Braden scale every shift;Provide wound care per wound care algorithm;Reposition patient every 2 hours and as needed unless able to reposition self;Increase activity as tolerated/progressive mobility;Relieve pressure to bony prominences for patients at moderate and high risk;Keep intact skin clean and dry;Use bath wipes, not soap and water, for daily bathing;Avoid shearing injuries;Use incontinence wipes for cleaning urine, stool and caustic drainage. Foley care as needed;Monitor external devices/tubes for correct placement to prevent pressure, friction and shearing;Encourage use of lotion/moisturizer on skin;Monitor patient's hygiene practices;Consult/collaborate with wound care nurse;Utilize specialty bed;Consider placing an indwelling catheter if incontinence interferes with healing of stage 3 or 4 pressure injury       Problem: Safety  Goal: Patient will be free from injury during  hospitalization  Outcome: Progressing   12/05/17 2219   Goal/Interventions addressed this shift   Patient will be free from injury during hospitalization  Assess patient's risk for falls and implement fall prevention plan of care per policy;Provide and maintain safe environment;Use appropriate transfer methods;Ensure appropriate safety devices are available at the bedside;Include patient/ family/ care giver in decisions related to safety;Hourly rounding;Assess for patients risk for elopement and implement Elopement Risk Plan per policy;Provide alternative method of communication if needed (communication boards, writing)     Goal: Patient will be free from infection during hospitalization  Outcome: Progressing   12/05/17 2219   Goal/Interventions addressed this shift   Free from Infection during hospitalization Assess and monitor for signs and symptoms of infection       Problem: Pain  Goal: Pain at adequate level as identified by patient  Outcome: Progressing   12/05/17 2219   Goal/Interventions addressed this shift   Pain at adequate level as identified by patient Identify patient comfort function goal;Assess for risk of opioid induced respiratory depression, including snoring/sleep apnea. Alert healthcare team of risk factors identified.;Assess pain on admission, during daily assessment and/or before any "as needed" intervention(s);Reassess pain within 30-60 minutes of any procedure/intervention, per Pain Assessment, Intervention, Reassessment (AIR) Cycle;Evaluate patient's satisfaction with pain management progress;Evaluate if patient comfort function goal is met;Offer non-pharmacological pain management interventions;Consult/collaborate with Pain Service;Consult/collaborate with Physical Therapy, Occupational Therapy, and/or Speech Therapy;Include patient/patient care companion in decisions related to pain management as needed       Problem: Discharge Barriers  Goal: Patient will be discharged home or other  facility with appropriate resources  Outcome: Progressing   12/05/17 2219   Goal/Interventions addressed this shift   Discharge to home or other facility with appropriate  resources Provide appropriate patient education;Provide information on available health resources;Initiate discharge planning

## 2017-12-05 NOTE — Plan of Care (Signed)
Problem: Everyday - Heart Failure  Goal: Stable Vital Signs and Fluid Balance  Outcome: Progressing   12/05/17 2222   Goal/Interventions addressed this shift   Stable Vital Signs and Fluid Balance Daily Standing Weights in the morning using the same scale, after using the bathroom and before breadfast. If unable to stand, zero the bed and use the bed scale;Monitor, assess vital signs and telemetry per policy;Assess for swelling/edema;Monitor labs and report abnormalities to physician;Wean oxygen as needed if appropriate;Strict Intake/Output;Fluid Restriction     Goal: Nutritional Intake is Adequate  Outcome: Progressing   12/05/17 2222   Goal/Interventions addressed this shift   Nutritional Intake is Adequate Cardiac diet-2 gm Sodium;Consult/Collaborate with Nutritionist;Fluid Restricction if needed;Patient and family teaching on low sodium diet;Assess appetite,anorexia and amount of meal/food tolerated;Encourage/perform oral hygiene as appropriate       Problem: Compromised Tissue integrity  Goal: Damaged tissue is healing and protected  Outcome: Progressing   12/05/17 2222   Goal/Interventions addressed this shift   Damaged tissue is healing and protected  Monitor/assess Braden scale every shift;Provide wound care per wound care algorithm;Reposition patient every 2 hours and as needed unless able to reposition self;Increase activity as tolerated/progressive mobility;Relieve pressure to bony prominences for patients at moderate and high risk;Keep intact skin clean and dry;Use bath wipes, not soap and water, for daily bathing;Use incontinence wipes for cleaning urine, stool and caustic drainage. Foley care as needed;Monitor external devices/tubes for correct placement to prevent pressure, friction and shearing;Encourage use of lotion/moisturizer on skin;Monitor patient's hygiene practices       Problem: Safety  Goal: Patient will be free from infection during hospitalization  Outcome: Progressing   12/05/17 2222    Goal/Interventions addressed this shift   Free from Infection during hospitalization Assess and monitor for signs and symptoms of infection       Problem: Side Effects from Pain Analgesia  Goal: Patient will experience minimal side effects of analgesic therapy  Outcome: Progressing   12/05/17 2222   Goal/Interventions addressed this shift   Patient will experience minimal side effects of analgesic therapy Monitor/assess patient's respiratory status (RR depth, effort, breath sounds);Assess for changes in cognitive function;Prevent/manage side effects per LIP orders (i.e. nausea, vomiting, pruritus, constipation, urinary retention, etc.)       Problem: Discharge Barriers  Goal: Patient will be discharged home or other facility with appropriate resources  Outcome: Progressing   12/05/17 2222   Goal/Interventions addressed this shift   Discharge to home or other facility with appropriate resources Provide appropriate patient education;Provide information on available health resources;Initiate discharge planning       Problem: Psychosocial and Spiritual Needs  Goal: Demonstrates ability to cope with hospitalization/illness  Outcome: Progressing   12/05/17 2222   Goal/Interventions addressed this shift   Demonstrates ability to cope with hospitalizations/illness Encourage verbalization of feelings/concerns/expectations;Provide quiet environment;Encourage patient to set small goals for self;Assist patient to identify own strengths and abilities;Encourage participation in diversional activity;Reinforce positive adaptation of new coping behaviors;Include patient/ patient care companion in decisions;Communicate referral to spiritual care as appropriate

## 2017-12-06 ENCOUNTER — Other Ambulatory Visit (INDEPENDENT_AMBULATORY_CARE_PROVIDER_SITE_OTHER): Payer: Self-pay | Admitting: Family Medicine

## 2017-12-06 ENCOUNTER — Inpatient Hospital Stay: Payer: Medicare Other

## 2017-12-06 ENCOUNTER — Ambulatory Visit (INDEPENDENT_AMBULATORY_CARE_PROVIDER_SITE_OTHER): Payer: Self-pay

## 2017-12-06 DIAGNOSIS — M170 Bilateral primary osteoarthritis of knee: Secondary | ICD-10-CM

## 2017-12-06 DIAGNOSIS — M79604 Pain in right leg: Secondary | ICD-10-CM

## 2017-12-06 LAB — BASIC METABOLIC PANEL
Anion Gap: 13 (ref 5.0–15.0)
BUN: 113.4 mg/dL — ABNORMAL HIGH (ref 9.0–28.0)
CO2: 17 mEq/L — ABNORMAL LOW (ref 22–29)
Calcium: 7.7 mg/dL — ABNORMAL LOW (ref 7.9–10.2)
Chloride: 100 mEq/L (ref 100–111)
Creatinine: 6.2 mg/dL — ABNORMAL HIGH (ref 0.7–1.3)
Glucose: 127 mg/dL — ABNORMAL HIGH (ref 70–100)
Potassium: 5.4 mEq/L — ABNORMAL HIGH (ref 3.5–5.1)
Sodium: 130 mEq/L — ABNORMAL LOW (ref 136–145)

## 2017-12-06 LAB — PROTEIN / CREATININE RATIO, URINE
Urine Creatinine, Random: 85.8 mg/dL
Urine Protein Random: <7 mg/dL (ref 1.0–14.0)

## 2017-12-06 LAB — CBC
Absolute NRBC: 0 10*3/uL (ref 0.00–0.00)
Hematocrit: 25.5 % — ABNORMAL LOW (ref 37.6–49.6)
Hgb: 8.6 g/dL — ABNORMAL LOW (ref 12.5–17.1)
MCH: 30.8 pg (ref 25.1–33.5)
MCHC: 33.7 g/dL (ref 31.5–35.8)
MCV: 91.4 fL (ref 78.0–96.0)
MPV: 10.5 fL (ref 8.9–12.5)
Nucleated RBC: 0 /100 WBC (ref 0.0–0.0)
Platelets: 184 10*3/uL (ref 142–346)
RBC: 2.79 10*6/uL — ABNORMAL LOW (ref 4.20–5.90)
RDW: 13 % (ref 11–15)
WBC: 9.72 10*3/uL — ABNORMAL HIGH (ref 3.10–9.50)

## 2017-12-06 LAB — URINALYSIS WITH MICROSCOPIC
Bilirubin, UA: NEGATIVE
Blood, UA: NEGATIVE
Glucose, UA: NEGATIVE
Ketones UA: NEGATIVE
Leukocyte Esterase, UA: NEGATIVE
Nitrite, UA: NEGATIVE
Protein, UR: NEGATIVE
Specific Gravity UA: 1.011 (ref 1.001–1.035)
Urine pH: 5 (ref 5.0–8.0)
Urobilinogen, UA: NORMAL mg/dL

## 2017-12-06 LAB — GLUCOSE WHOLE BLOOD - POCT
Whole Blood Glucose POCT: 112 mg/dL — ABNORMAL HIGH (ref 70–100)
Whole Blood Glucose POCT: 87 mg/dL (ref 70–100)
Whole Blood Glucose POCT: 107 mg/dL — ABNORMAL HIGH (ref 70–100)
Whole Blood Glucose POCT: 195 mg/dL — ABNORMAL HIGH (ref 70–100)

## 2017-12-06 LAB — GFR: EGFR: 8.6

## 2017-12-06 MED ORDER — INSULIN LISPRO 100 UNIT/ML SC SOLN
3.00 [IU] | Freq: Three times a day (TID) | SUBCUTANEOUS | Status: DC
Start: 2017-12-06 — End: 2017-12-07
  Administered 2017-12-06: 17:00:00 3 [IU] via SUBCUTANEOUS
  Filled 2017-12-06: qty 9

## 2017-12-06 MED ORDER — INSULIN GLARGINE 100 UNIT/ML SC SOLN
10.00 [IU] | Freq: Every evening | SUBCUTANEOUS | Status: DC
Start: 2017-12-06 — End: 2017-12-07
  Administered 2017-12-06: 21:00:00 10 [IU] via SUBCUTANEOUS
  Filled 2017-12-06: qty 10

## 2017-12-06 MED ORDER — LACTULOSE 10 GM/15ML PO SOLN
30.00 g | Freq: Once | ORAL | Status: AC
Start: 2017-12-06 — End: 2017-12-06
  Administered 2017-12-06: 15:00:00 30 g via ORAL
  Filled 2017-12-06 (×2): qty 30

## 2017-12-06 MED ORDER — PREDNISONE 5 MG PO TABS
5.00 mg | ORAL_TABLET | Freq: Every morning | ORAL | Status: DC
Start: 2017-12-07 — End: 2017-12-07
  Administered 2017-12-07: 09:00:00 5 mg via ORAL
  Filled 2017-12-06: qty 1

## 2017-12-06 MED ORDER — BISACODYL 10 MG RE SUPP
10.00 mg | Freq: Once | RECTAL | Status: AC
Start: 2017-12-06 — End: 2017-12-06
  Administered 2017-12-06: 18:00:00 10 mg via RECTAL
  Filled 2017-12-06: qty 1

## 2017-12-06 MED ORDER — RISAQUAD PO CAPS
1.00 | ORAL_CAPSULE | Freq: Every day | ORAL | Status: DC
Start: 2017-12-06 — End: 2017-12-08
  Administered 2017-12-06 – 2017-12-08 (×3): 1 via ORAL
  Filled 2017-12-06 (×3): qty 1

## 2017-12-06 NOTE — Plan of Care (Signed)
Problem: Safety  Goal: Patient will be free from injury during hospitalization  Outcome: Progressing   12/05/17 2219   Goal/Interventions addressed this shift   Patient will be free from injury during hospitalization  Assess patient's risk for falls and implement fall prevention plan of care per policy;Provide and maintain safe environment;Use appropriate transfer methods;Ensure appropriate safety devices are available at the bedside;Include patient/ family/ care giver in decisions related to safety;Hourly rounding;Assess for patients risk for elopement and implement [place] per policy;Provide alternative method of communication if needed (communication boards, writing)       Problem: Pain  Goal: Pain at adequate level as identified by patient  Outcome: Progressing   12/05/17 2219   Goal/Interventions addressed this shift   Pain at adequate level as identified by patient Identify patient comfort function goal;Assess for risk of opioid induced respiratory depression, including snoring/sleep apnea. Alert healthcare team of risk factors identified.;Assess pain on admission, during daily assessment and/or before any "as needed" intervention(s);Reassess pain within 30-60 minutes of any procedure/intervention, per Pain Assessment, Intervention, Reassessment (AIR) Cycle;Evaluate patient's satisfaction with pain management progress;Evaluate if patient comfort function goal is met;Offer non-pharmacological pain management interventions;Consult/collaborate with Pain Service;Consult/collaborate with Physical Therapy, Occupational Therapy, and/or Speech Therapy;Include patient/patient care companion in decisions related to pain management as needed

## 2017-12-06 NOTE — Progress Notes (Signed)
[facility]  Progress Note  Patient Info:   Date/Time: 12/06/2017 / 7:07 PM   Admit Date:12/01/2017  Patient Name:Marco Bruce   JKQ:[id number]   PCP: Danella Sensing, MD  Attending Physician:Moani Weipert, Lorin Glass, MD     Assessment and Plan:   Patient [facility] Problem List:   Acute on chronic diastolic congestive heart failure (08/13/2016)    Assessment: Patient's BNP has been elevated this admission at 1100.  His weight has gone up 30 pounds since his last admission about 6 months ago.  He denied any orthopnea or PND.  He does very little activity.  He denies any chest pains or palpitations.  He is unable to provide me with the name of the physician who is following him and directing his diuretic therapy.  Echocardiogram May 2018 showed 40% ejection fraction and T TEE 4/19 showed an ejection fraction of 40% with a mild to moderate MR and mild AR.  Patient has about chronic pacemaker that was placed in November 2017.  He is now off all cigarettes.  Patient is breathing easier today.    Plan: Appreciate consultation with cardiology.  Restart  diuretic therapy with indication of fluid overload despite wosening renal function the patient has severe dyspnea.  He has chronic stage IV kidney disease.Have asked nephrology to follow with us.patient's wife died last year after being on dialysis and he declines this now.   Stage 4 chronic kidney disease ()    Assessment: He has been seen by [facility], Dayna Barker, MD in the past. He has chronic kidney disease dating back to 2007 with a baseline creatinine of 1.6.  UA has shown no hematuria.  Ultrasound of the kidneys 08/20/16 show in the right kidney to be 10 cm and the left 10.5 cm. Patient very fluid overloaded.    Plan: Kidney function appears to be improving.Appreciate nephrology consultation.Family and patient continue to decline dialysis.1263mL fluid restriction strictly observed by daughter and now the  grandson who is at the bedside.. Lasix held. Daughter declines Foley catheter for the patient.  He had this placed in the past and had significant troubles with his urinary stream after that.  He is voiding well by her account.  Patient is incontinent of urine and I cannot follow how much is his output.I have asked nursing for a bladder scan   Renovascular hypertension (08/04/2015)    Assessment: This is well controlled with present medical regimen.  His blood pressure today was 102/57 with a pulse of 80    Plan: We will stop the nifedipine and effort to increase blood flow to the kidney.  Would like blood pressure to be 537-943 systolic   Mixed hyperlipidemia ()    Assessment: Patient is on Lipitor 20 mg daily   plan: No changes in medication   Type 2 diabetes mellitus with complication, with long-term current use of insulin ()    Assessment: Last A1c was done February 2019 and was 7.6    Plan: We will continue to adjust medications.  To wean down on the corticosteroids and the patient's health improves we are also weaning down on the insulin.  Continue with carbohydrate consistent diet.  Nutrition consult   Acute gout of right knee (06/05/2017)    Assessment: Pain is improving with the corticosteroids that were provided for the patient's wheezing.  The knee is no longer warm and his pain is significantly improved    Plan: Decrease of corticosteroids and continue the  patient on allopurinol and hold addition of colchicine made after admission.  Corticosteroids can sometimes limit the fluid diuresis and has been decreased to 5mg    Acute bronchitis due to other specified organisms (12/01/2017)    Assessment: Chest x-ray negative other than a chronically elevated right hemidiaphragm.  Patient's wheezing with the infection has improved today. Fluid overload proved with fluid restriction.Procalcitonin elevated at 0.35. Chest XR with question right basilar infiltrate    Plan:started antibiotics 12/03/17 and with patient having  chills rigors and sweats for the first 24 hours of the admission.  This has all  now cleared.  Question if infection may have also played a part in the acute renal decompensation.   Pacemaker (12/01/2017)    Assessment: This is placed on the left side last year and appears to be functioning well on the telemetry monitor.  The wound site is well-healed    Plan: We will monitor      DVT Prohylaxis:heparin and SEDs   Central Line/Foley Catheter/PICC line status: No  Code Status: Full Code  Disposition:home  Type of Admission:Inpatient  Expected Date of Discharge: 48 hours  Milestones required for discharge: Work on diuresis and monitor renal function closely.  Patient has had a cardiology consultation and may need renal consultation as well.  We will watch the electrolytes and renal function closely.  PT OT consult to see if the patient is cleared to take care of himself at home.    Subjective:   Chief Complaint:  Shortness of Breath and Knee Pain  Patient originally [place] from [place].  He does not speak any [place].  He is asked that his grandson and daughter do the translating.  I offered him an interpreter.  On admission the patient had evidence of fluid overload.  He was given a few doses of Lasix 40 mg twice daily and within 36 hours had significant worsening of his renal function.  His procalcitonin level was checked and elevated and he had a question of a right lower lobe infiltrate.   He was complaining of sweats and chills on admission.  He was started on Rocephin and Zithromax and now feels better.  The reticulocyte therapy has been held and his nifedipine discontinued to allow better blood flow to his kidneys.  He refuses dialysis treatments.  I appreciate nephrology's input.  Continue with 1200 mL fluid restriction and close observation of his renal function.  Today his creatinine has improved from 7 to 6    ROS  Objective:     Vitals:    12/06/17 1018 12/06/17 1032 12/06/17 1321 12/06/17 1656   BP: 117/74   118/71 147/74   Pulse:  77 74 69   Resp: 16  20 20    Temp: 97 F (36.1 C)  98.2 F (36.8 C) 98.6 F (37 C)   TempSrc: Temporal Artery  Oral Oral   SpO2:   96% 99%   Weight:       Height:           Intake/Output Summary (Last 24 hours) at 12/06/17 1907  Last data filed at 12/06/17 1800   Gross per 24 hour   Intake              290 ml   Output             1240 ml   Net             -950 ml    Physical Exam   Constitutional:  He is oriented to person, place, and time.  As per his interpreter which is his grandson he appears well-developed and well-nourished.mild respiratory distress.  He is smiling and pleasant.  There is an exhalation wheeze that is no longer audible at the bedside as it was for the first several days of this admission. Daughter notes greenish mucous production is still present but improving. No sweats now.  Grandson at the bedside and feels that the patient is better today.  We did decrease his gabapentin to bedtime alone and he is more alert  HENT:   Head: Normocephalic and atraumatic.   Eyes: Pupils are equal, round, and reactive to light.   Neck: Normal range of motion. Neck supple. No JVD present.   Cardiovascular: Normal rate, regular rhythm, normal heart sounds and intact distal pulses.    Pulmonary/Chest: Effort normal. He has only a few wheezes (expiratory). He has no rales. He exhibits no tenderness.  Dullness at the right base to percussion due to chronic elevation of the right hemidiaphragm  Abdominal: Soft. Bowel sounds are normal. He exhibits no distension and no mass. There is no tenderness. There is no guarding.   Musculoskeletal: Normal range of motion. He exhibits no edema.  There is mild to moderate tenderness of a mildly warm right knee.  There is also mild effusion of the right knee.  No signs of gout in the feet.  Lymphadenopathy:     He has no cervical, clavicular, axillary, or inguinal adenopathy.   Neurological: He is alert and oriented to person, place, and time. He  displays normal reflexes. No cranial nerve deficit. Coordination normal.   Skin: Skin is warm.   Psychiatric: He has a normal mood and affect.     Medications:      Scheduled Meds: PRN Meds:        acetylcysteine 3 mL Nebulization BID   albuterol-ipratropium 3 mL Nebulization TID   allopurinol 100 mg Oral Daily   aspirin 81 mg Oral Daily   atorvastatin 20 mg Oral Daily   azithromycin 500 mg Intravenous Q24H SCH   cefTRIAXone 1 g Intravenous Q24H SCH   gabapentin 100 mg Oral QHS   heparin (porcine) 5,000 Units Subcutaneous Q12H [place]   insulin glargine 10 Units Subcutaneous QHS   insulin lispro 1-3 Units Subcutaneous QHS   insulin lispro 1-5 Units Subcutaneous TID AC   insulin lispro 3 Units Subcutaneous TID AC   lactobacillus/streptococcus 1 capsule Oral Daily   metoprolol succinate XL 25 mg Oral Daily   pantoprazole 40 mg Oral QAM AC   polyethylene glycol 17 g Oral Once   [START ON 12/07/2017] predniSONE 5 mg Oral QAM W/BREAKFAST   senna-docusate 2 tablet Oral BID   sodium bicarbonate 1,300 mg Oral BID       Continuous Infusions:     dextromethorphan-guaiFENesin 1 tablet BID PRN   dextrose 15 g of glucose PRN   And     dextrose 12.5 g PRN   And     glucagon (rDNA) 1 mg PRN   guaiFENesin 200 mg Q6H PRN   naloxone 0.2 mg PRN   ondansetron 4 mg Q6H PRN           Results of Labs/imaging   Labs and radiology reports have been reviewed.    Hospitalist   Signed by:   Timoteo Gaul  12/06/2017 7:07 PM    *This note was generated by the Epic EMR system/ Dragon speech recognition and may contain inherent  errors or omissions not intended by the user. Grammatical errors, random word insertions, deletions, pronoun errors and incomplete sentences are occasional consequences of this technology due to software limitations. Not all errors are caught or corrected. If there are questions or concerns about the content of this note or information contained within the body of this dictation they should be addressed directly with the  author for clarification

## 2017-12-06 NOTE — PT Progress Note (Addendum)
Physical Therapy Note    [facility]  [place], [place]    Department of Rehabilitation  [phone]    Physical Therapy Daily Treatment Note    Patient: Marco Bruce    MRN#: [id number]     [id number]    Time of Treatment: Start Time: 1150 Stop Time: 1220 Time Calculation (min): 30 min    PT Visit Number: 2    Patient's medical condition is appropriate for Physical Therapy intervention at this time.    Precautions and Contraindications:  Precautions  Weight Bearing Status: no restrictions  Other Precautions: fall    Assessment: Pt tol rx well, progressing as noted by increased functional mobility, decreased assistance required with functional mobility, increased gait distance.  Assessment: Decreased endurance/activity tolerance;Decreased functional mobility;Gait impairment Prognosis: Good;With continued PT status post acute discharge   Progress: Progressing toward goals            Plan:   Continue with Physical Therapy services to address mobility and endurance deficits. Focus next session on continued mobility progression.  Treatment/Interventions: Exercise;Gait training;Stair training;Neuromuscular re-education;Functional transfer training;LE strengthening/ROM;Endurance training;Patient/family training;Bed mobility   PT Frequency: 3-4x/wk     Based on today's session patient's discharge recommendation is the following: Discharge Recommendation: Home with home health PT;Home with supervision.       If Discharge Recommendation: Home with home health PT;Home with supervision is not available, then the patient will need increase supervision , 24/7 supervision, assistance with mobility and assistance with ADL's.DME needs if primary discharge not available - Rolling walker, 24 hour supervision            Subjective: Patient is agreeable to participation in the therapy session. Nursing clears patient for therapy.         Objective:  Observation of Patient/Vital Signs:  Patient is in  bed with telemetry in place.    Cognition/Neuro Status  Arousal/Alertness: Appropriate responses to stimuli  Behavior: calm;cooperative    Functional Mobility  Supine to Sit: Minimal Assist  Scooting to EOB: Minimal Assist  Sit to Supine: Minimal Assist (increased assistance for BLE)  Sit to Stand: Increased Time;Increased Effort;Minimal Assist;with instruction for hand placement to increase safety  Stand to Sit: Minimal Assist (verbal instruction for hand placement and eccentric control)     Locomotion  Ambulation: Contact Guard Assist  Pattern: decreased cadence;decreased step length  Distance Walked (ft) (Step 6,7): 120 Feet          Therapeutic Exercise  Ankle Pumps: x 20 BLE   Patient instructed in the above exercises with a focus on pacing between exercises and full ROM. Advised patient to complete established home exercise plan at least twice a day to increase generalized strength, endurance, and to facilitate increased independence with mobility and ADL's.            Neuro Re-Ed  Standing Balance: dynamic gait training;with instruction;contact guard assist     Treatment Activities: Performed mobility and there-ex as per above. Family present.  Instructed pt in proper execution of each exercise to maximize benefit. Instructed pt to pause between transitional movements and assess for dizziness before proceeding for safety.  Instructed pt in pacing and encouraged pt to take rest breaks as needed to promote energy conservation.  Instructed pt in deep breathing/pursed lip breathing to ensure proper oxygenation and expansion of lung bases.  Verbal instruction provided for all above functional mobility with facilitation of correct postural alignment ensuring upright posture with shoulder and hip alignment  Facilitated proper postural alignment while ambulating with RW.  Verbal instruction for step through sequencing to include correct RW placement with advancement of bilateral LE and proper use of both arms to help  compensate for LE weakness and unsteady gait.  Instructed pt to maintain proper proximity to RW for safety.  Instructed pt to turn in semi-circle while ambulating with RW vs pivoting for safety.  Instructed pt to approach bed and place posterior of BLE against bed prior to sitting.  Educated pt in importance of OOB mobility for overall strength and endurance, respiratory health, circulation and skin integrity.  Encouraged pt to eat meals in chair.  Instructed pt to perform AP as tolerated to promote circulation BLE and decrease effects of immobility.                  Educated the patient to role of physical therapy, plan of care, goals of therapy and safety with mobility and ADLs, energy conservation techniques, pursed lip breathing, home safety.    Patient left without needs and call bell within reach.  Alarm set.  RN notified of session outcome.    Salem Senate, PT

## 2017-12-06 NOTE — Plan of Care (Signed)
Problem: Moderate/High Fall Risk Score >5  Goal: Patient will remain free of falls  Outcome: Progressing   12/06/17 1955   High Risk Falls Interventions (Greater than 13)   VH High Risk (Greater than 13) ALL REQUIRED LOW INTERVENTIONS       Problem: Everyday - Heart Failure  Goal: Stable Vital Signs and Fluid Balance  Outcome: Progressing   12/06/17 1955   Goal/Interventions addressed this shift   Stable Vital Signs and Fluid Balance Daily Standing Weights in the morning using the same scale, after using the bathroom and before breadfast. If unable to stand, zero the bed and use the bed scale;Monitor labs and report abnormalities to physician;Strict Intake/Output;Monitor, assess vital signs and telemetry per policy;Fluid Restriction;Assess for swelling/edema;Wean oxygen as needed if appropriate       Problem: Compromised Tissue integrity  Goal: Damaged tissue is healing and protected  Outcome: Progressing   12/06/17 1955   Goal/Interventions addressed this shift   Damaged tissue is healing and protected  Monitor/assess Braden scale every shift;Provide wound care per wound care algorithm;Reposition patient every 2 hours and as needed unless able to reposition self;Increase activity as tolerated/progressive mobility;Relieve pressure to bony prominences for patients at moderate and high risk;Keep intact skin clean and dry;Avoid shearing injuries;Use bath wipes, not soap and water, for daily bathing;Use incontinence wipes for cleaning urine, stool and caustic drainage. Foley care as needed;Monitor external devices/tubes for correct placement to prevent pressure, friction and shearing;Encourage use of lotion/moisturizer on skin;Monitor patient's hygiene practices       Problem: Safety  Goal: Patient will be free from injury during hospitalization  Outcome: Progressing   12/06/17 1955   Goal/Interventions addressed this shift   Patient will be free from injury during hospitalization  Assess patient's risk for falls and  implement fall prevention plan of care per policy;Use appropriate transfer methods;Provide and maintain safe environment;Ensure appropriate safety devices are available at the bedside;Include patient/ family/ care giver in decisions related to safety;Hourly rounding;Assess for patients risk for elopement and implement Elopement Risk Plan per policy;Provide alternative method of communication if needed (communication boards, writing)     Goal: Patient will be free from infection during hospitalization  Outcome: Progressing   12/06/17 1955   Goal/Interventions addressed this shift   Free from Infection during hospitalization Assess and monitor for signs and symptoms of infection       Problem: Pain  Goal: Pain at adequate level as identified by patient  Outcome: Progressing   12/06/17 1955   Goal/Interventions addressed this shift   Pain at adequate level as identified by patient Identify patient comfort function goal;Assess for risk of opioid induced respiratory depression, including snoring/sleep apnea. Alert healthcare team of risk factors identified.;Evaluate patient's satisfaction with pain management progress;Reassess pain within 30-60 minutes of any procedure/intervention, per Pain Assessment, Intervention, Reassessment (AIR) Cycle;Assess pain on admission, during daily assessment and/or before any "as needed" intervention(s);Evaluate if patient comfort function goal is met;Offer non-pharmacological pain management interventions;Consult/collaborate with Pain Service;Consult/collaborate with Physical Therapy, Occupational Therapy, and/or Speech Therapy;Include patient/patient care companion in decisions related to pain management as needed       Problem: Psychosocial and Spiritual Needs  Goal: Demonstrates ability to cope with hospitalization/illness  Outcome: Progressing   12/06/17 1955   Goal/Interventions addressed this shift   Demonstrates ability to cope with hospitalizations/illness Assist patient to  identify own strengths and abilities;Encourage patient to set small goals for self;Provide quiet environment;Encourage verbalization of feelings/concerns/expectations;Encourage participation in diversional activity;Reinforce positive adaptation of  new coping behaviors;Include patient/ patient care companion in decisions;Communicate referral to spiritual care as appropriate

## 2017-12-06 NOTE — Progress Notes (Signed)
[facility].  Progress Note    Assessment:  AKI on CKD 4, suspect ATN related to relative renal ischemia +/- other.  Mild peripheral eosinophilia initially.  Cr 2.8--> 3.5 --> 7.3 --> 7 --> 6.1  CKD 4  CHF  Hyponatremia- hypervolemic  Hyperkalemia  Metabolic acidosis  Dm2    Plan:  - needs renal imaging --> ultrasound is pending  - Lasix 120 mg IV if he has worsening SOB  - Dose medications with GFR<15 ml/min  - needs UA with micro and urine prot/cR  - CR is downtrending today, no HD needed for now and patient not interested in HD.  Strict I&Os  D/w his son at bedside    Godfrey Pick, MD  Office - [phone]  ++++++++++++++++++++++++++++++++++++++++++++++++++++++++++++++  Subjective:  No new complaints    Medications:  Scheduled Meds:  Current Facility-Administered Medications   Medication Dose Route Frequency   . acetylcysteine  3 mL Nebulization BID   . albuterol-ipratropium  3 mL Nebulization TID   . allopurinol  100 mg Oral Daily   . aspirin  81 mg Oral Daily   . atorvastatin  20 mg Oral Daily   . azithromycin  500 mg Intravenous Q24H [place]   . cefTRIAXone  1 g Intravenous Q24H SCH   . gabapentin  100 mg Oral QHS   . heparin (porcine)  5,000 Units Subcutaneous Q12H [facility]   . insulin glargine  16 Units Subcutaneous QHS   . insulin lispro  1-3 Units Subcutaneous QHS   . insulin lispro  1-5 Units Subcutaneous TID AC   . insulin lispro  5 Units Subcutaneous TID AC   . lactobacillus/streptococcus  1 capsule Oral Daily   . metoprolol succinate XL  25 mg Oral Daily   . NIFEdipine  30 mg Oral Daily   . pantoprazole  40 mg Oral QAM AC   . polyethylene glycol  17 g Oral Once   . predniSONE  10 mg Oral QAM W/BREAKFAST   . senna-docusate  2 tablet Oral BID   . sodium bicarbonate  1,300 mg Oral BID     Continuous Infusions:  PRN Meds:dextromethorphan-guaiFENesin, Nursing communication: Adult Hypoglycemia Treatment Algorithm **AND** dextrose **AND** dextrose **AND** glucagon (rDNA),  guaiFENesin, naloxone, ondansetron    Objective:  Vital signs in last 24 hours:  Temp:  [97 F (36.1 C)-98.4 F (36.9 C)] 97 F (36.1 C)  Heart Rate:  [67-78] 74  Resp Rate:  [16-22] 16  BP: (102-138)/(57-74) 117/74  Intake/Output last 24 hours:    Intake/Output Summary (Last 24 hours) at 12/06/17 1038  Last data filed at 12/06/17 0900   Gross per 24 hour   Intake              540 ml   Output              690 ml   Net             -150 ml     Intake/Output this shift:  I/O this shift:  In: 120 [P.O.:120]  Out: 150 [Urine:150]    Physical Exam:   Gen:   NAD   CV: S1 S2    Chest: decreased at bases   Ab: Nondistended, soft   Ext: + mild edema    Labs:    Recent Labs  Lab 12/06/17  0614 12/05/17  0644 12/04/17  0657  12/01/17  1733   Glucose 127* 239* 220* More results in Results  Review 96   BUN 113.4* 107.5* 104.4* More results in Results Review 50.7*   Creatinine 6.2* 7.1* 7.3* More results in Results Review 2.8*   Calcium 7.7* 7.7* 8.0 More results in Results Review 9.4   Sodium 130* 125* 129* More results in Results Review 136   Potassium 5.4* 5.8* 5.8* More results in Results Review 5.1   Chloride 100 97* 99* More results in Results Review 105   CO2 17* 15* 16* More results in Results Review 23   Albumin  --   --   --   --  4.0   Phosphorus  --   --   --   --  4.4   Magnesium  --   --   --   --  2.0   More results in Results Review = values in this interval not displayed.    Recent Labs  Lab 12/06/17  0614 12/05/17  0644 12/04/17  0657   WBC 9.72* 9.23 10.49*   Hgb 8.6* 8.9* 8.4*   Hematocrit 25.5* 26.4* 25.2*   MCV 91.4 92.3 93.3   MCH 30.8 31.1 31.1   MCHC 33.7 33.7 33.3   RDW 13 13 13    MPV 10.5 10.4 9.7   Platelets 184 165 169

## 2017-12-06 NOTE — Telephone Encounter (Signed)
Last Encounter:  08/2017  Next Appointment: none pending  Last rx- 8/19  Last Primary Care Labs:   Lab Results   Component Value Date    WBC 9.72 (H) 12/06/2017    HGB 8.6 (L) 12/06/2017    HCT 25.5 (L) 12/06/2017    PLT 184 12/06/2017    CHOL 113 (L) 05/31/2006    TRIG 125 05/31/2006    LDL 45 (L) 05/31/2006    ALT 6 12/01/2017    AST 22 12/01/2017    NA 130 (L) 12/06/2017    K 5.4 (H) 12/06/2017    CL 100 12/06/2017    CREAT 6.2 (H) 12/06/2017    BUN 113.4 (H) 12/06/2017    CO2 17 (L) 12/06/2017    TSH 2.54 06/02/2017    PSA 1.0 01/12/2006    INR 1.0 08/13/2016    GLU 127 (H) 12/06/2017    HGBA1C 7.6 (H) 06/02/2017

## 2017-12-07 DIAGNOSIS — M10361 Gout due to renal impairment, right knee: Secondary | ICD-10-CM

## 2017-12-07 DIAGNOSIS — I10 Essential (primary) hypertension: Secondary | ICD-10-CM

## 2017-12-07 DIAGNOSIS — E782 Mixed hyperlipidemia: Secondary | ICD-10-CM

## 2017-12-07 LAB — GLUCOSE WHOLE BLOOD - POCT
Whole Blood Glucose POCT: 79 mg/dL (ref 70–100)
Whole Blood Glucose POCT: 70 mg/dL (ref 70–100)
Whole Blood Glucose POCT: 118 mg/dL — ABNORMAL HIGH (ref 70–100)
Whole Blood Glucose POCT: 158 mg/dL — ABNORMAL HIGH (ref 70–100)
Whole Blood Glucose POCT: 179 mg/dL — ABNORMAL HIGH (ref 70–100)

## 2017-12-07 MED ORDER — INSULIN GLARGINE 100 UNIT/ML SC SOLN
7.00 [IU] | Freq: Every evening | SUBCUTANEOUS | Status: DC
Start: 2017-12-07 — End: 2017-12-08
  Administered 2017-12-07: 21:00:00 7 [IU] via SUBCUTANEOUS
  Filled 2017-12-07: qty 7

## 2017-12-07 MED ORDER — FUROSEMIDE 40 MG PO TABS
40.00 mg | ORAL_TABLET | Freq: Every day | ORAL | Status: DC
Start: 2017-12-08 — End: 2017-12-08
  Administered 2017-12-08: 09:00:00 40 mg via ORAL
  Filled 2017-12-07: qty 1

## 2017-12-07 NOTE — PT Progress Note (Signed)
Physical Therapy Cancellation Note    Patient: Marco Bruce  XFG:[id number]    Unit: [id number]/[id number]    Patient not seen for physical therapy.  Attempted 1420, pt having breathing treatment, pt and grandson napping.  Returned !550, pt in bathroom with grandson assisting.  Pt ambulating with RW and grandsons supervision from bathroom to bed, pt declined offer to ambulate with therapist for PT session, stating he ambulated earlier and did not want to walk again.  Pt presents at/near functional baseline, modified independent/supervision with basic mobility skills.  Patient has no concerns about returning home with family supervision.  No acute PT needs identified, D/C acute PT services.

## 2017-12-07 NOTE — Plan of Care (Signed)
Problem: Everyday - Heart Failure  Goal: Stable Vital Signs and Fluid Balance  Outcome: Progressing   12/07/17 2328   Goal/Interventions addressed this shift   Stable Vital Signs and Fluid Balance Daily Standing Weights in the morning using the same scale, after using the bathroom and before breadfast. If unable to stand, zero the bed and use the bed scale;Monitor, assess vital signs and telemetry per policy;Monitor labs and report abnormalities to physician;Strict Intake/Output;Fluid Restriction;Assess for swelling/edema;Wean oxygen as needed if appropriate     Goal: Nutritional Intake is Adequate  Outcome: Progressing   12/07/17 2328   Goal/Interventions addressed this shift   Nutritional Intake is Adequate Cardiac diet-2 gm Sodium;Fluid Restricction if needed;Patient and family teaching on low sodium diet;Assess appetite,anorexia and amount of meal/food tolerated;Encourage/perform oral hygiene as appropriate       Problem: Safety  Goal: Patient will be free from injury during hospitalization  Outcome: Progressing   12/07/17 2328   Goal/Interventions addressed this shift   Patient will be free from injury during hospitalization  Assess patient's risk for falls and implement fall prevention plan of care per policy;Provide and maintain safe environment;Use appropriate transfer methods;Ensure appropriate safety devices are available at the bedside;Include patient/ family/ care giver in decisions related to safety;Hourly rounding

## 2017-12-07 NOTE — Progress Notes (Signed)
[facility]  Progress Note  Patient Info:   Date/Time: 12/07/2017 / 10:39 AM   Admit Date:12/01/2017  Patient Name:Marco Bruce   DJM:[id number]   PCP: Danella Sensing, MD  Attending Physician:Ottavio Norem, Raeanne Barry, MD     Assessment and Plan:      Acute on chronic diastolic congestive heart failure   -Improved with IV Lasix, once he was clinically stable, IV Lasix was stopped as his creatinine was worsening.  Will resume home dose Lasix tomorrow and possibly discharge home.   -Already on aspirin beta blocker and statins  - Monitor I/O  - Daily weigh  - Fluid restriction   - Cardiology eval appreciated     Stage 4 chronic kidney disease   -AKI on CKD 4, suspect ATN related to relative renal ischemia +/- other.  Mild peripheral eosinophilia initially.  Cr 2.8--> 3.5 --> 7.3 --> 7 --> 6.1  -Metabolic acidosis  -Dose medications with GFR<15 ml/min  - worsening creatinine after IV diuresis  -Patient and family denying the hemodialysis  -Monitor BMP  - Renal US - no hydronephrosis      Renovascular hypertension   - We will stop the nifedipine and effort to increase blood flow to the kidney.  Would like blood pressure to be [phone] systolic  -Continue metoprolol     Mixed hyperlipidemia   -Continue Lipitor     Type 2 diabetes mellitus with complication, with long-term current use of insulin   -Last A1c was done February 2019 and was 7.6  -Continue Lantus and sliding scale insulin  -Continue with carbohydrate consistent diet.  Nutrition consult     Acute gout of right knee   - improved now, stopped Prednisone      Acute bronchitis due to other specified organisms (12/01/2017)  - Procalcitonin elevated at 0.35. Chest XR with question right basilar infiltrate  -started antibiotics 12/03/17 and with patient having chills rigors and sweats for the first 24 hours of the admission.  This has all  now cleared.  Question if infection may have also played a part in the acute renal decompensation.     Pacemaker    This is placed on the left side last year and appears to be functioning well on the telemetry monitor.  The wound site is well-healed          DVT Prohylaxis:heparin and SEDs   Central Line/Foley Catheter/PICC line status: No  Code Status: Full Code  Disposition:home  Type of Admission:Inpatient  Expected Date of Discharge: 50 hours  Milestones required for discharge: Stabilize renal function    Hospital Problems:   Principal Problem:    Acute on chronic diastolic congestive heart failure  Active Problems:    Stage 4 chronic kidney disease    Renovascular hypertension    Essential hypertension    Mixed hyperlipidemia    Type 2 diabetes mellitus with complication, with long-term current use of insulin    Acute gout of right knee    Acute renal failure superimposed on stage 3 chronic kidney disease    Acute bronchitis due to other specified organisms    Pacemaker    Bronchitis    Diaphragm paralysis    Subjective:   12/07/17   Daughter translated for the patient  Informs patient is feeling fine, denies any chest pain shortness of breath or palpitations.  Chief Complaint:  Shortness of Breath and Knee Pain    ROS  Objective:     Vitals:    12/06/17  2101 12/07/17 0057 12/07/17 0503 12/07/17 0942   BP: 121/69 130/61 142/71 148/64   Pulse: 67 64 70 72   Resp: 17 18 17 16    Temp: 98.1 F (36.7 C) 98.4 F (36.9 C) 97.9 F (36.6 C) 97.6 F (36.4 C)   TempSrc: Temporal Artery Temporal Artery Temporal Artery Temporal Artery   SpO2: 98% 98% 99% 99%   Weight:   80.6 kg (177 lb 9.6 oz)    Height:         Physical Exam:   Physical Exam   Constitutional: He is oriented to person, place, and time and well-developed, well-nourished, and in no distress.   HENT:   Head: Normocephalic and atraumatic.   Cardiovascular: Normal rate and regular rhythm.    Pulmonary/Chest: Effort normal and breath sounds normal.   Abdominal: Soft. Bowel sounds are normal.   Musculoskeletal: Normal range of motion. He exhibits no edema.    Neurological: He is alert and oriented to person, place, and time.   Skin: Skin is warm and dry.   Psychiatric: Affect and judgment normal.   Nursing note and vitals reviewed.    Results of Labs/imaging   Labs and radiology reports have been reviewed.    Hospitalist   Signed by:   Sherre Scarlet  12/07/2017 10:39 AM    *This note was generated by the Epic EMR system/ Dragon speech recognition and may contain inherent errors or omissions not intended by the user. Grammatical errors, random word insertions, deletions, pronoun errors and incomplete sentences are occasional consequences of this technology due to software limitations. Not all errors are caught or corrected. If there are questions or concerns about the content of this note or information contained within the body of this dictation they should be addressed directly with the author for clarification

## 2017-12-07 NOTE — Progress Notes (Signed)
[facility].  Progress Note    Assessment:   AKI on CKD   CHF   DM   Metabolic acidosis   No hydro on [place]    Plan:   Daily labs    Queen Slough, MD  Office - [phone]  ++++++++++++++++++++++++++++++++++++++++++++++++++++++++++++++  Subjective:  No new complaints    Medications:  Scheduled Meds:  Current Facility-Administered Medications   Medication Dose Route Frequency   . albuterol-ipratropium  3 mL Nebulization TID   . allopurinol  100 mg Oral Daily   . aspirin  81 mg Oral Daily   . atorvastatin  20 mg Oral Daily   . azithromycin  500 mg Intravenous Q24H [place]   . cefTRIAXone  1 g Intravenous Q24H [facility]   . gabapentin  100 mg Oral QHS   . heparin (porcine)  5,000 Units Subcutaneous Q12H [facility]   . insulin glargine  10 Units Subcutaneous QHS   . insulin lispro  1-3 Units Subcutaneous QHS   . insulin lispro  1-5 Units Subcutaneous TID AC   . insulin lispro  3 Units Subcutaneous TID AC   . lactobacillus/streptococcus  1 capsule Oral Daily   . metoprolol succinate XL  25 mg Oral Daily   . pantoprazole  40 mg Oral QAM AC   . polyethylene glycol  17 g Oral Once   . predniSONE  5 mg Oral QAM W/BREAKFAST   . senna-docusate  2 tablet Oral BID   . sodium bicarbonate  1,300 mg Oral BID     Continuous Infusions:  PRN Meds:dextromethorphan-guaiFENesin, Nursing communication: Adult Hypoglycemia Treatment Algorithm **AND** dextrose **AND** dextrose **AND** glucagon (rDNA), guaiFENesin, naloxone, ondansetron    Objective:  Vital signs in last 24 hours:  Temp:  [97.6 F (36.4 C)-98.6 F (37 C)] 97.6 F (36.4 C)  Heart Rate:  [64-74] 72  Resp Rate:  [16-20] 16  BP: (118-148)/(61-74) 148/64    Intake/Output from yesterday (07:01 - 07:00):  09/03 0701 - 09/04 0700  In: 290 [P.O.:290]  Out: 2330 [Urine:2330]     Physical Exam:   Gen: WD WN NAD   CV: S1 S2 N RRR   Chest: CTAB   Ab: ND NT soft no HSM +BS   Ext: No C/E    Labs:    Recent Labs  Lab 12/06/17  0614 12/05/17  0644 12/04/17  0657   12/01/17  1733   Glucose 127* 239* 220* More results in Results Review 96   BUN 113.4* 107.5* 104.4* More results in Results Review 50.7*   Creatinine 6.2* 7.1* 7.3* More results in Results Review 2.8*   Calcium 7.7* 7.7* 8.0 More results in Results Review 9.4   Sodium 130* 125* 129* More results in Results Review 136   Potassium 5.4* 5.8* 5.8* More results in Results Review 5.1   Chloride 100 97* 99* More results in Results Review 105   CO2 17* 15* 16* More results in Results Review 23   Albumin  --   --   --   --  4.0   Phosphorus  --   --   --   --  4.4   Magnesium  --   --   --   --  2.0   More results in Results Review = values in this interval not displayed.    Recent Labs  Lab 12/06/17  0614 12/05/17  0644 12/04/17  0657   WBC 9.72* 9.23 10.49*   Hgb 8.6*  8.9* 8.4*   Hematocrit 25.5* 26.4* 25.2*   MCV 91.4 92.3 93.3   MCH 30.8 31.1 31.1   MCHC 33.7 33.7 33.3   RDW 13 13 13    MPV 10.5 10.4 9.7   Platelets 184 165 169

## 2017-12-07 NOTE — Plan of Care (Signed)
Problem: Everyday - Heart Failure  Goal: Stable Vital Signs and Fluid Balance  Outcome: Progressing   12/07/17 0859   Goal/Interventions addressed this shift   Stable Vital Signs and Fluid Balance Daily Standing Weights in the morning using the same scale, after using the bathroom and before breadfast. If unable to stand, zero the bed and use the bed scale;Monitor, assess vital signs and telemetry per policy;Monitor labs and report abnormalities to physician;Strict Intake/Output;Fluid Restriction;Wean oxygen as needed if appropriate;Assess for swelling/edema     Goal: Nutritional Intake is Adequate  Outcome: Progressing   12/07/17 0859   Goal/Interventions addressed this shift   Nutritional Intake is Adequate Cardiac diet-2 gm Sodium;Fluid Restricction if needed;Patient and family teaching on low sodium diet;Consult/Collaborate with Nutritionist;Assess appetite,anorexia and amount of meal/food tolerated;Encourage/perform oral hygiene as appropriate       Problem: Safety  Goal: Patient will be free from injury during hospitalization  Outcome: Progressing   12/07/17 0859   Goal/Interventions addressed this shift   Patient will be free from injury during hospitalization  Assess patient's risk for falls and implement fall prevention plan of care per policy;Provide and maintain safe environment;Use appropriate transfer methods;Ensure appropriate safety devices are available at the bedside;Include patient/ family/ care giver in decisions related to safety;Hourly rounding       Problem: Pain  Goal: Pain at adequate level as identified by patient  Outcome: Progressing   12/07/17 0859   Goal/Interventions addressed this shift   Pain at adequate level as identified by patient Identify patient comfort function goal;Assess for risk of opioid induced respiratory depression, including snoring/sleep apnea. Alert healthcare team of risk factors identified.;Assess pain on admission, during daily assessment and/or before any "as  needed" intervention(s);Reassess pain within 30-60 minutes of any procedure/intervention, per Pain Assessment, Intervention, Reassessment (AIR) Cycle;Evaluate if patient comfort function goal is met

## 2017-12-07 NOTE — Progress Notes (Addendum)
PULSE OXIMETRY TESTING: (For Home Oxygen)    Document patient's oxygen at rest on room air:     _____% on room air, at rest (NO Ranges please)    _____% on Oxygen, at rest at ____LPM via Nasal Canula      IF patient's saturation is 88% OR BELOW on room air, STOP Here,  IF NOT ambulate patient on room air with exertion and document below:    _____% on Room Air, with exertion (must be 88% or below)    _____% on Oxygen, with exertion, at ____LPM via NC    All three(3) tests must be during the same session.    RN:  Please copy and paste above template and document O2 saturations in a PROGRESS NOTE.    Testing to qualify for home Oxygen must be no earlier than 48 hours prior to discharge, or it will need to be repeated.    Please call the home health care liaison at [phone] when completed.  Thanks.

## 2017-12-07 NOTE — Consults (Signed)
Nutritional Support Services  Nutrition Education    Marco Bruce 84 y.o. male  MRN [id number]    Referral Source : MD consult     Consult received to educate pt on : CHF diet education     Diet history and/or patient's prior knowledge of diet principles : per son has been educated in the past and he follows all the restriction at home, doesn't use salt at all, follows fluid restriction also ( 1200 ml)     Handouts provided to patient : pt's denied for any handout .     Diet guidelines reviewed verbally with patient :  Pt's son denied for any detail education , reports he had enough in the past so no further questions or concern at this time         Business card left for patient to call with any questions or concerns.    Marco Pitcher Ettel Albergo,MS,RD

## 2017-12-08 LAB — CBC
Absolute NRBC: 0 10*3/uL (ref 0.00–0.00)
Hematocrit: 28.1 % — ABNORMAL LOW (ref 37.6–49.6)
Hgb: 9.6 g/dL — ABNORMAL LOW (ref 12.5–17.1)
MCH: 31.5 pg (ref 25.1–33.5)
MCHC: 34.2 g/dL (ref 31.5–35.8)
MCV: 92.1 fL (ref 78.0–96.0)
MPV: 10.3 fL (ref 8.9–12.5)
Nucleated RBC: 0 /100 WBC (ref 0.0–0.0)
Platelets: 219 10*3/uL (ref 142–346)
RBC: 3.05 10*6/uL — ABNORMAL LOW (ref 4.20–5.90)
RDW: 13 % (ref 11–15)
WBC: 8.21 10*3/uL (ref 3.10–9.50)

## 2017-12-08 LAB — RENAL FUNCTION PANEL
Albumin: 3.7 g/dL (ref 3.5–5.0)
Anion Gap: 6 (ref 5.0–15.0)
BUN: 66 mg/dL — ABNORMAL HIGH (ref 9.0–28.0)
CO2: 25 mEq/L (ref 22–29)
Calcium: 9 mg/dL (ref 7.9–10.2)
Chloride: 111 mEq/L (ref 100–111)
Creatinine: 2.6 mg/dL — ABNORMAL HIGH (ref 0.7–1.3)
Glucose: 72 mg/dL (ref 70–100)
Phosphorus: 3.7 mg/dL (ref 2.3–4.7)
Potassium: 4.3 mEq/L (ref 3.5–5.1)
Sodium: 142 mEq/L (ref 136–145)

## 2017-12-08 LAB — GLUCOSE WHOLE BLOOD - POCT
Whole Blood Glucose POCT: 82 mg/dL (ref 70–100)
Whole Blood Glucose POCT: 181 mg/dL — ABNORMAL HIGH (ref 70–100)

## 2017-12-08 LAB — GFR: EGFR: 23.3

## 2017-12-08 MED ORDER — SODIUM BICARBONATE 650 MG PO TABS
1300.00 mg | ORAL_TABLET | Freq: Two times a day (BID) | ORAL | 0 refills | Status: DC
Start: 2017-12-08 — End: 2018-02-13

## 2017-12-08 MED ORDER — METOPROLOL SUCCINATE ER 50 MG PO TB24
50.00 mg | ORAL_TABLET | Freq: Every day | ORAL | Status: DC
Start: 2017-12-09 — End: 2017-12-08

## 2017-12-08 MED ORDER — DEXTROMETHORPHAN-GUAIFENESIN ER 30-600 MG PO TB12
1.00 | ORAL_TABLET | Freq: Two times a day (BID) | ORAL | Status: DC | PRN
Start: 2017-12-08 — End: 2018-04-11

## 2017-12-08 MED ORDER — METOPROLOL SUCCINATE ER 50 MG PO TB24
50.00 mg | ORAL_TABLET | Freq: Every day | ORAL | Status: DC
Start: 2017-12-08 — End: 2017-12-08
  Administered 2017-12-08: 11:00:00 50 mg via ORAL
  Filled 2017-12-08: qty 1

## 2017-12-08 MED ORDER — ASPIRIN 81 MG PO CHEW
81.00 mg | CHEWABLE_TABLET | Freq: Every day | ORAL | Status: AC
Start: 2017-12-09 — End: ?

## 2017-12-08 MED ORDER — LANTUS SOLOSTAR 100 UNIT/ML SC SOPN
10.00 [IU] | PEN_INJECTOR | Freq: Every evening | SUBCUTANEOUS | 3 refills | Status: DC
Start: 2017-12-08 — End: 2018-02-13

## 2017-12-08 MED ORDER — SENNOSIDES-DOCUSATE SODIUM 8.6-50 MG PO TABS
2.00 | ORAL_TABLET | Freq: Two times a day (BID) | ORAL | Status: DC
Start: 2017-12-08 — End: 2018-04-11

## 2017-12-08 NOTE — Discharge Instr - AVS First Page (Signed)
Reason for your Hospital Admission:  Congestive heart failure       Instructions for after your discharge:  Please continue medications as prescribed  Follow up with Dr Kate Sable and Dr Jessy Oto   Check weight every day and if >2 lb weight gain discuss with Cardiologist   Fluid restriction 1200 ml/day

## 2017-12-08 NOTE — Discharge Summary (Signed)
[facility]   [place] Summary   Patient Info:   Date/Time: 12/08/2017 / 1:15 PM   Admit Date:12/01/2017  Patient Name:Marco Bruce   QWQ:[id number]   PCP: Danella Sensing, MD  Attending Physician:Tukker Byrns, Raeanne Barry, MD     Hospital Course:   Please see H&P for complete details of HPI and ROS. The patient was admitted to [facility] and has been taken care as mentioned below.    Acute on chronic diastolic congestive heart failure  -Improved with IV Lasix, once he was clinically stable, IV Lasix was stopped as his creatinine was worsening. Resumed today. Will continue home dose after discharge   -Already on aspirin beta blocker and statins  - Daily weigh  - Fluid restriction 1200 ml/day     Stage 4 chronic kidney diseaseCr 2.8-->3.5 -->7.3 -->7 -->6.1-->2.6  -AKI on CKD 4, suspect possibly due to IV diuresis - improved after stopping Lasix  -Metabolic acidosis improved   - Renal US - no hydronephrosis     Renovascular hypertension   - We will stopthe nifedipine and effort to increase blood flow to the kidney. Would like blood pressure to be [phone] systolic  -Continue metoprolol XL, hydralazine, and valsartan     Mixed hyperlipidemia  -Continue Lipitor    Type 2 diabetes mellitus with complication, with long-term current use of insulin   -Last A1c was done February 2019 and was 7.6  -Continue Lantus - reduced home dose to 10 HS  - Resume Linagliptin     Acute gout of right knee   - improved now, stopped Prednisone yesterday     Acute bronchitis due to other specified organisms  - Procalcitonin elevated at 0.35. Chest XR with question right basilar infiltrate  -Improved with Rocephin and Zithromax     Pacemaker  This is placed on the left side last year and appears to be functioning well on the telemetry monitor. The wound site is well-healed      Patient instructions  Reason for your Hospital Admission:  Congestive heart failure     Instructions for after your  discharge:  Please continue medications as prescribed  Follow up with Dr Kate Sable and Dr Jessy Oto   Check weight every day and if >2 lb weight gain discuss with Cardiologist   Fluid restriction 1200 ml/day         Disposition:home  Condition at Discharge and Prognosis: stable now but poor prognosis due to CHF and progressive CKD   Admission Date:12/01/2017  Discharge Date: 12/08/17  Type of Admission:Inpatient   Code Status: Full Code  Subjective at the time of discharge:   Pt denies any SOB chest pain or palpitations. No fever or chills   Chief Complaint:  Shortness of Breath and Knee Pain    Objective:     Vitals:    12/08/17 0443 12/08/17 0719 12/08/17 1049 12/08/17 1303   BP: 150/68  155/73 162/74   Pulse: 75 70 76 74   Resp: 17 20 18 18    Temp: 98.1 F (36.7 C)  98.3 F (36.8 C) 97.9 F (36.6 C)   TempSrc:   Oral    SpO2: 94%  96% 99%   Weight: 77.4 kg (170 lb 9.6 oz)      Height:         Physical Exam:   Heart - S1, S2 heard.  No murmur  Lungs - bilateral air entry present, no wheeze or rales  Abdomen - soft, bowel sounds normal, nondistended,  no tenderness  Central nervous system - no focal deficits  Musculoskeletal - no edema     Clinical Presentation:   History of Presenting Illness: Please refer to HPI in the Detailed H&P  Discharge Medications:   Discharge Medications:      Discharge Medication List      Taking    acetaminophen 325 MG tablet  Dose:  650 mg  Commonly known as:  TYLENOL  Take 2 tablets (650 mg total) by mouth every 4 (four) hours as needed for Fever (Temperature greater than 38 C/100.4 F).     allopurinol 100 MG tablet  Dose:  100 mg  Commonly known as:  ZYLOPRIM  Take 1 tablet (100 mg total) by mouth daily     aspirin 81 MG chewable tablet  Dose:  81 mg  Start taking on:  12/09/2017  Chew 1 tablet (81 mg total) by mouth daily     atorvastatin 20 MG tablet  Dose:  20 mg  Commonly known as:  LIPITOR  Take 1 tablet (20 mg total) by mouth daily     cetirizine 10 MG tablet  Dose:  10 mg  Commonly  known as:  ZyrTEC  Take 1 tablet (10 mg total) by mouth daily as needed for Rhinitis.     dextromethorphan-guaiFENesin 30-600 MG per 12 hr tablet  Dose:  1 tablet  Commonly known as:  MUCINEX DM  Take 1 tablet by mouth 2 (two) times daily as needed (cough)     furosemide 20 MG tablet  Dose:  40 mg  Commonly known as:  LASIX  For:  Edema, High Blood Pressure Disorder  Take 2 tablets (40 mg total) by mouth daily     gabapentin 100 MG capsule  Commonly known as:  NEURONTIN  TAKE ONE CAPSULE BY MOUTH EVERY 8 HOURS     hydrALAZINE 50 MG tablet  Dose:  50 mg  Commonly known as:  APRESOLINE  Take 1 tablet (50 mg total) by mouth every morning     insulin glargine 100 UNIT/ML injection pen  Dose:  10 Units  What changed:  how much to take  For:  Type 2 Diabetes  Inject 10 Units into the skin nightly     linaGLIPtin 5 MG Tabs  Dose:  5 mg  Take 1 tablet (5 mg total) by mouth daily     metoprolol succinate XL 25 MG 24 hr tablet  Dose:  25 mg  Commonly known as:  TOPROL-XL  Take 1 tablet (25 mg total) by mouth daily     pantoprazole 40 MG tablet  Commonly known as:  PROTONIX  TAKE ONE TABLET BY MOUTH EVERY DAY     RISAQUAD PO  Dose:  1 capsule  Take 1 capsule by mouth daily.     senna-docusate 8.6-50 MG per tablet  Dose:  2 tablet  Commonly known as:  PERICOLACE  Take 2 tablets by mouth 2 (two) times daily     sodium bicarbonate 650 MG tablet  Dose:  1300 mg  Take 2 tablets (1,300 mg total) by mouth 2 (two) times daily     valsartan 80 MG tablet  Dose:  80 mg  Commonly known as:  DIOVAN  Take 1 tablet (80 mg total) by mouth daily.        STOP taking these medications    docusate sodium 100 MG capsule  Commonly known as:  COLACE     NIFEdipine 60 MG 24 hr tablet  Commonly known as:  PROCARDIA XL          Follow up recommendations:   Follow up:   Follow-up Information     [facility] Follow up in 2 day(s).    Why:  Home Health Visits  Contact information:  [place] [address]  [phone]            Queen Slough, MD Follow up in 1 week(s).    Specialties:  Nephrology, Internal Medicine  Contact information:  [address]  [place] [address]  [phone]             Emeline General, MD Follow up in 3 day(s).    Specialty:  Cardiology  Contact information:  [address]  [place] [address]  [phone]             Danella Sensing, MD .    Specialty:  Family Medicine  Contact information:  [place] [address]  [phone]                  Results of Labs/imaging:   Labs have been reviewed:   Coagulation Profile:       CBC review:   Recent Labs  Lab 12/08/17  0542 12/06/17  0614 12/05/17  0644 12/04/17  0657 12/02/17  0314 12/01/17  1733   WBC 8.21 9.72* 9.23 10.49* 5.28 7.07   Hgb 9.6* 8.6* 8.9* 8.4* 9.7* 10.5*   Hematocrit 28.1* 25.5* 26.4* 25.2* 29.8* 31.7*   Platelets 219 184 165 169 147 160   MCV 92.1 91.4 92.3 93.3 94.0 94.1   RDW 13 13 13 13 13 13    Neutrophils  --   --   --   --  84.7 45.8   Lymphocytes Automated  --   --   --   --  10.0 26.7   Eosinophils Automated  --   --   --   --  0.9 14.3   Immature Granulocyte  --   --   --   --  0.4 0.1   Neutrophils Absolute  --   --   --   --  4.47 3.23   Absolute Immature Granulocyte  --   --   --   --  0.02 0.01     Chem Review:  Recent Labs  Lab 12/08/17  0542 12/06/17  0614 12/05/17  0644 12/04/17  0657 12/03/17  0626  12/01/17  1733   Sodium 142 130* 125* 129* 131* More results in Results Review 136   Potassium 4.3 5.4* 5.8* 5.8* 5.6* More results in Results Review 5.1   Chloride 111 100 97* 99* 100 More results in Results Review 105   CO2 25 17* 15* 16* 16* More results in Results Review 23   BUN 66.0* 113.4* 107.5* 104.4* 87.5* More results in Results Review 50.7*   Creatinine 2.6* 6.2* 7.1* 7.3* 5.8* More results in Results Review 2.8*   Glucose 72 127* 239* 220* 258* More results in Results Review 96   Calcium 9.0 7.7* 7.7* 8.0 8.9 More results in Results Review 9.4   Magnesium  --   --   --    --   --   --  2.0   Phosphorus 3.7  --   --   --   --   --  4.4   Bilirubin, Total  --   --   --   --   --   --  0.4   AST (SGOT)  --   --   --   --   --   --  22   ALT  --   --   --   --   --   --  6   Alkaline Phosphatase  --   --   --   --   --   --  51   More results in Results Review = values in this interval not displayed.  Results     Procedure Component Value Units Date/Time    Glucose Whole Blood - POCT [[id number]]  (Abnormal) Collected:  12/08/17 1125     Updated:  12/08/17 1141     POCT - Glucose Whole blood 181 (H) mg/dL     Glucose Whole Blood - POCT [[id number]] Collected:  12/08/17 0748     Updated:  12/08/17 0751     POCT - Glucose Whole blood 82 mg/dL     GFR [[id number]] Collected:  12/08/17 0542     Updated:  12/08/17 0635     EGFR 23.3    Renal function panel [[id number]]  (Abnormal) Collected:  12/08/17 0542    Specimen:  Blood Updated:  12/08/17 0635     Glucose 72 mg/dL      Sodium 142 mEq/L      Potassium 4.3 mEq/L      Chloride 111 mEq/L      CO2 25 mEq/L      BUN 66.0 (H) mg/dL      Calcium 9.0 mg/dL      Creatinine 2.6 (H) mg/dL      Albumin 3.7 g/dL      Phosphorus 3.7 mg/dL      Anion Gap 6.0    CBC without differential [[id number]]  (Abnormal) Collected:  12/08/17 0542    Specimen:  Blood from Blood Updated:  12/08/17 0608     WBC 8.21 x10 3/uL      Hgb 9.6 (L) g/dL      Hematocrit 28.1 (L) %      Platelets 219 x10 3/uL      RBC 3.05 (L) x10 6/uL      MCV 92.1 fL      MCH 31.5 pg      MCHC 34.2 g/dL      RDW 13 %      MPV 10.3 fL      Nucleated RBC 0.0 /100 WBC      Absolute NRBC 0.00 x10 3/uL     Glucose Whole Blood - POCT [[id number]]  (Abnormal) Collected:  12/07/17 2021     Updated:  12/07/17 2139     POCT - Glucose Whole blood 179 (H) mg/dL     Glucose Whole Blood - POCT [[id number]]  (Abnormal) Collected:  12/07/17 1619     Updated:  12/07/17 1622     POCT - Glucose Whole blood 158 (H) mg/dL         Radiology reports have been reviewed:  Radiology Results (24 Hour)     ** No results  found for the last 24 hours. **        Xr Knee 1 Or 2 Views Right    Result Date: 12/01/2017  History: Pain. 2 views of right knee were obtained and compared to 05/26/2016. There is no fracture or dislocation. There is knee effusion. Degenerative changes severe bilateral medial compartment. Vascular calcifications.     Knee effusion. No fracture seen. Degenerative changes. Donnetta Hail, MD  12/01/2017 7:29 PM    Hip Right Ap And Lateral With Pelvis    Result Date: 12/01/2017  History: Pain. AP view of pelvis and frog-leg lateral view right hip were obtained and compared to 06/02/2017. There is no fracture or dislocation. Mild spurring right superior acetabulum. Soft tissue density projecting over right lateral thigh unchanged. Vascular calcifications.     No fracture seen. Mild spurring right superior acetabulum. Donnetta Hail, MD 12/01/2017 7:31 PM    US Renal Kidney    Result Date: 12/06/2017  Renal and bladder ultrasound CLINICAL INFORMATION: Acute on chronic renal failure. FINDINGS: The right kidney measures 9.2 cm in length. The left kidney measures 8.8 cm in length. There is no hydronephrosis. Visualization of the kidneys is somewhat limited secondary to patient body habitus. No definite renal masses or calculi are identified. The bladder measures 320 cc. Postvoid imaging was not performed as patient did not want to void at time of exam.      No hydronephrosis. Elwanda Brooklyn, MD 12/06/2017 7:51 PM    Xr Chest Ap Portable    Result Date: 12/03/2017  Clinical history: Wheezing. FINDINGS: AP view of the chest. Compared with prior studies including 12/01/2017.  Stable cardiomegaly. Improved pulmonary vascular congestion. Stable elevation of the right hemidiaphragm and mild right basilar airspace disease. Interstitial/airspace disease seen in remaining portions of both lungs is no longer evident and presumably represented pulmonary edema given rapid improvement.      1. Stable cardiomegaly and improved pulmonary  edema. 2. Stable elevation of the right hemidiaphragm and mild right basilar airspace disease. Darnelle Bos, MD 12/03/2017 3:54 PM    Xr Chest  Ap Portable    Result Date: 12/01/2017  HISTORY: Cough. COMMENT: Frontal view of the chest was obtained and compared to 06/05/2017. There is elevated right hemidiaphragm with mild basal atelectasis. No focal consolidation or pleural effusions. Mild prominence of central vasculature. Stable cardiomegaly. Left-sided transvenous pacer remains in place.     Cardiomegaly with mild prominence of central vasculature may represent mild central vascular congestion. Elevated right hemidiaphragm with mild basal atelectasis. No focal consolidation. Donnetta Hail, MD 12/01/2017 7:31 PM    Pathology:   Specimens     None        Pending Lab Results:   Labs/Images to be followed at your PCP office: Unresulted Labs     None        Hospitalist:   Signed by: Sherre Scarlet  12/08/2017 1:15 PM  Time spent for discharge: 45 minutes      *This note was generated by the Epic EMR system/ Dragon speech recognition and may contain inherent errors or omissions not intended by the user. Grammatical errors, random word insertions, deletions, pronoun errors and incomplete sentences are occasional consequences of this technology due to software limitations. Not all errors are caught or corrected. If there are questions or concerns about the content of this note or information contained within the body of this dictation they should be addressed directly with the author for clarification

## 2017-12-08 NOTE — Progress Notes (Addendum)
Home Health Referral          Referral from Claris Gower (Case Manager) for home health care upon discharge.    Home Health Discharge Information     Your doctor has ordered Skilled Nursing, Physical Therapy and Occupational Therapy in-home service(s) for you while you recuperate at home, to assist you in the transition from hospital to home.      The agency that you or your representative chose to provide the service:  Name of [place] [facility]acement: [facility]  [phone]    The above services were set up by:  Theador Hawthorne  ([place])   Phone      [phone]                   Additional comments:   If you have not heard from your home health agency within 24-48 hours after discharge please call your agency to arrange a time for your first visit.  For any scheduling concerns or questions related to home health, such as time or date please contact your home health agency at the number listed above.     Home Health face-to-face (FTF) Encounter (Order [id number])   Consult   Date: 12/08/2017 Department: [facility] Ordering/Authorizing: Sherre Scarlet, MD   Order Information     Order Date/Time Release Date/Time Start Date/Time End Date/Time   12/08/17 09:24 AM None 12/08/17 08:53 AM 12/08/17 08:53 AM   Order Details     Frequency Duration Priority Order Class   Once 1 occurrence Routine Hospital Performed   Standing Order Information     Remaining Occurrences Interval Last Released      0/1 Once 12/08/2017            Provider Information     Ordering User Ordering Provider Authorizing Provider   Uy-Le, Ricki Rodriguez, RN Sherre Scarlet, MD Sherre Scarlet, MD   Attending Provider(s) Admitting Provider PCP   Tollie Pizza, DO; Mila Merry, MD; Timoteo Gaul, MD; Sherre Scarlet, MD Mila Merry, MD Danella Sensing, MD   Verbal Order Info     Action Created on Order Mode Entered by Responsible Provider Signed by Signed on    Ordering 12/08/17 [phone] Telephone with readback Uy-Le, Ricki Rodriguez, RN Sherre Scarlet, MD     [place]    Attending Physician (signing [phone] (usually PMD)): Dr. Ernest Mallick  Cardiologist (secondary):     -Front loading of RN and Rehab visits (when ordered)    -Initiate Heart Failure Pathway    -Initiate Diuretic Protocol    -Facilitate making a follow-up appointment with PMD and cardiologist if not already scheduled. Patients should see their cardiologist within 3-5 days of hospital discharge and their PCP at least once in the month after discharge.  If the primary care physician is not available, an appointment can be made at the [facility] - [phone].    -Perform medication reconciliation at every visit and contact the attending physician or cardiologist with any discrepancies. The discharge physician should be contacted if those physicians are not available or cannot clarify.    -Perform HF zone teaching at every visit.    -Ensure the patient has a digital scale available to them.      Skilled nursing for skilled assessment, pain management, medication reconciliation/management, nutrition/hydration, and home safety reinforcement/management. Patient with weakness, gait instability,  decreased endurance and deconditioned. PT/OT for gait training, strengthening, ADL retraining, and home safety evaluation.  Patient with dysphagia. Speech for diet tolerance monitoring, swallow strategies training, and aspiration precautions management    Primary Diagnosis: Acute on chronic diastolic congestive heart failure  Secondary: Type 2 diabetes mellitus with complication, with long-term current use of insulin (Chronic)  Stage 4 chronic kidney disease (Chronic)         Order Questions     Question Answer Comment   Date of face-to-face (FTF) encounter: 12/07/2017    Medical conditions that necessitate Home Health care: B. Functional impairment due to  recent hospitalization/procedure/treatment     C. Risk for complication/infection/pain requiring follow up and monitoring     D. Chronic illness & risk for re-hospitalization due to unstable disease status     E. Exacerbation of disease requiring follow up monitoring     F. New diagnosis & treatment requiring follow up monitoring and management     H. Multiple new medications requiring management and monitoring    Clinical findings that support the need for Skilled Nursing. SN will: C. Monitor for signs and symptoms of exacerbation of disease and management     D. Review medication reconciliation, manage and educate on use and side effects     G. Educate on new diagnosis, treatment & management to prevent re-hospitalization     I. Educate dietary and or fluid restrictions and weight management     H. Assess cardiopulmonary status and monitor for signs &symptoms of exacerbation    Clinical findings that support the need for Physical Therapy. PT will A. Evaluate and treat functional impairment and improve mobility     C. Educate on weight bearing status, stair/gait training, balance & coordination     D. Provide services to help restore function, mobility, and releive pain     E. Educate on functional mobility; bed, chair, sit, stand and transfer activities     F. Perform home safety assessment & develop safe in home exercise program     I. Instruct on restorative activities to restore ability to perform ADL    Clinical findings support the need for OT (needs SN/PT order).OT will A. Develop in home program to improve ability to perform ADLs     B. Develop restorative program to improve mobility and independence     C. Educate on recovery and maintenance skills     D. Instruct on strategies to compensate for loss of function    Clinical findings that support the need for SLP. ST will F. N/A    Per clinical findings, following services are medically necessary: OT     PT     Skilled Nursing    Evidence  this patient is homebound because: B. Profound weakness, poor balance/unsteady gait d/t illness/treatment/procedure     F. Deconditioned due to advance disease process requiring assistance to leave home     N. Impaired mobility d/t pain, arthritis, weakness that compromises patient safety     C. Decreased endurance, strength, ROM, cadence, safety/judgment during mobility    Other (please specify) PCP Dr. Ernest Mallick          Process Instructions     Please select [place] medically necessary.     Based on the above findings, I certify that this patient is confined to the home and needs intermittent skilled nursing care, physical therapry and / or speech therapy or continues to need occupational therapy. The patient is under my  care, and I have initiated the establishment of the plan of care. This patient will be followed by a physician who will periodically review the plan of care.         Collection Information     Consult Order Info     ID Description Priority Start Date Start Time   [id number] [place] face-to-face (FTF) Encounter Routine 12/08/2017 8:53 AM   Provider Specialty Referred to   ______________________________________ _____________________________________   Acknowledgement Info     For At Acknowledged By Acknowledged On   Placing Order 12/08/17 0924 Uy-Le, Ricki Rodriguez, RN 12/08/17 [phone]   Verbal Order Info     Action Created on Order Mode Entered by Responsible Provider Signed by Signed on   Ordering 12/08/17 [phone] Telephone with readback Uy-Le, Ricki Rodriguez, RN Sherre Scarlet, MD     Patient Information     Patient Name  Marco Bruce, Marco Bruce Sex  Male DOB  11/25/1926   Additional Information     Associated Reports External References   Priority and Order Details [place]       Patient's Name: [facility] Date: 9/6   DOB: 1926-10-21 From: Darnelle Maffucci RN BSN  [place] Liaison  [phone]     MRN: [id number] For Physician:  Ernest Mallick, MD   Attending Physician: Sherre Scarlet, MD For (PT, OT, SLP, etc.:  SN PT OT     Additional contact:  Daughter: [name] [phone] or work [phone]  [name] [phone]      Isolation Precautions:   No active isolations      Diagnoses:     Patient Active Problem List    Diagnosis Date Noted   . *HAcute on chronic diastolic congestive heart failure [I50.33] 08/13/2016   . HBronchitis [J40] 12/04/2017   . HDiaphragm paralysis [J98.6] 12/04/2017   . HAcute bronchitis due to other specified organisms [J20.8] 12/01/2017   . HPacemaker [Z95.0] 12/01/2017     Chronic   . HAcute gout of right knee [M10.9] 06/05/2017   . HAcute renal failure superimposed on stage 3 chronic kidney disease [N17.9, N18.3] 06/05/2017   . HMixed hyperlipidemia [E78.2]    . HType 2 diabetes mellitus with complication, with long-term current use of insulin [E11.8, Z79.4]      Chronic   . Hypokalemia [E87.6] 03/18/2017   . Hyponatremia [E87.1] 08/13/2016   . HEssential hypertension [I10] 08/13/2016     Chronic   . HRenovascular hypertension [I15.0] 08/04/2015   . HStage 4 chronic kidney disease [N18.4]      Chronic       Admission Date:   12/01/2017    Discharge Date:   9/5    Recent Surgery, Date of Surgery, and Provider Performing:       * Surgery not found *    Allergies:   No Known Allergies        Height and Weight:     Ht Readings from Last 1 Encounters:   12/01/17 1.575 m (5\' 2" )     Wt Readings from Last 1 Encounters:   12/08/17 77.4 kg (170 lb 9.6 oz)       Immunizations:     Most Recent Immunizations   Administered Date(s) Administered   . INFLUENZA HIGH DOSE 71YRS+ 06/08/2017   . Pneumococcal 23 valent 12/24/2015       Past Medical History:        Past Medical History:  Diagnosis Date   . Acute diastolic heart failure 74/25/9563   . Acute on chronic diastolic congestive heart failure 08/13/2016   . Acute pulmonary edema 05/26/2015   . ARF (acute renal failure) 05/26/2015   . Chronic diastolic heart failure  87/56/4332   . Chronic gout    . Chronic pain syndrome 07/07/2015   . CKD (chronic kidney disease) stage 4, GFR 15-29 ml/min     2018 baseline creatinine in EPIC is 1.5-2.3.   . Congestive heart failure (CHF)     EF 40% 5/18 echo, on lasix   . Current chronic use of systemic steroids     For Polymyalgia rheumatica   . Essential hypertension    . Mixed hyperlipidemia    . NSTEMI (non-ST elevated myocardial infarction) 08/13/2016   . Osteoarthritis of both knees    . Peripheral edema 06/16/2015   . Pneumonia due to infectious organism, unspecified laterality, unspecified part of lung 06/25/2015   . Polymyalgia rheumatica     chronic steroids   . 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 Second degree heart block by electrocardiogram (ECG) 04/27/2015   . Type 2 diabetes mellitus with complication, with long-term current use of insulin     12/01/2016 Hemoglobin A1c 8.2%       Past Surgical History:     Past Surgical History:   Procedure Laterality Date   . ABDOMINAL SURGERY     . CHOLECYSTECTOMY     . PACEMAKER Left 2017       Social History:     Social History     Social History   . Marital status: Widowed     Spouse name: N/A   . Number of children: N/A   . Years of education: N/A     Social History Main Topics   . Smoking status: Never Smoker   . Smokeless tobacco: Never Used   . Alcohol use No      Comment: former alcoholic   . Drug use: No   . Sexual activity: Not on file     Other Topics Concern   . Not on file     Social History Narrative   . No narrative on file       Willing and Able Caregiver:       SKILLED NURSING (SN): Skilled assessment and medication instruction     Assessment and Plan:     Acute on chronic diastolic congestive heart failure  -Improved with IV Lasix, once he was clinically stable, IV Lasix was stopped as his creatinine was worsening.  Will resume home dose Lasix tomorrow and possibly discharge home.   -Already on aspirin beta blocker and statins  - Monitor I/O  - Daily weigh  - Fluid  restriction   - Cardiology eval appreciated    Stage 4 chronic kidney disease  -AKI on CKD 4, suspect ATN related to relative renal ischemia +/- other. Mild peripheral eosinophilia initially. Cr 2.8-->3.5 -->7.3 -->7 -->9.5  -Metabolic acidosis  -Dose medications with GFR<15 ml/min  -worsening creatinine after IV diuresis  -Patient and family denying the hemodialysis  -Monitor BMP  - Renal US - no hydronephrosis     Renovascular hypertension   - We will stopthe nifedipine and effort to increase blood flow to the kidney. Would like blood pressure to be 188-416 systolic  -Continue metoprolol    Mixed hyperlipidemia  -Continue Lipitor    Type 2 diabetes mellitus with complication, with long-term current use of insulin   -  Last A1c was done February 2019 and was 7.6  -Continue Lantus and sliding scale insulin  -Continue with carbohydrate consistent diet. Nutrition consult    Acute gout of right knee   - improved now, stopped Prednisone     Acute bronchitis due to other specified organisms(12/01/2017)  - Procalcitonin elevated at 0.35. Chest XR with question right basilar infiltrate  -started antibiotics 12/03/17 and with patient having chills rigors and sweats for the first 24 hoursof the admission. Thishas allnow cleared.Question if infection may have also played a part in the acute renal decompensation.    Pacemaker  This is placed on the left side last year and appears to be functioning well on the telemetry monitor. The wound site is well-healed    DVT Prohylaxis:heparin and SEDs   Central Line/Foley Catheter/PICC line status: No  Code Status: Full Code  Disposition:home  Type of Admission:Inpatient  Expected Date of Discharge: 48 hours  Milestones required for discharge: Stabilize renal function    PHYSICAL THERAPY (PT):     Assessment: Pt tol rx well, progressing as noted by increased functional mobility, decreased assistance required with functional mobility, increased gait distance.   Assessment: Decreased endurance/activity tolerance;Decreased functional mobility;Gait impairment Prognosis: Good;With continued PT status post acute discharge   Progress: Progressing toward goals   Plan:   Continue with Physical Therapy services to address mobility and endurance deficits. Focus next session on continued mobility progression.  Treatment/Interventions: Exercise;Gait training;Stair training;Neuromuscular re-education;Functional transfer training;LE strengthening/ROM;Endurance training;Patient/family training;Bed mobility   PT Frequency: 3-4x/wk   OCCUPATIONAL THERAPY (OT):   Eval.  and treat    Assessment:   Marco Bruce is a 84 y.o. male admitted 12/01/2017.   Brief chart review completed including review of labs, review of imaging, calling pt's family/caregiver for thorough history and review of H&P and physician progress notes.  Pt's ability to complete ADLs is at baseline at this time    Assessment: Appears to be at baseline for ADL's;decreased endurance/activity tolerance     Complexity Chart Review Performance Deficits Clinical Decision Making Hx/Comorbidities Assistance needed   Low Brief 1-3 Limited options None None (or at baseline)   Moderate Expanded 3-5 Several Options 1-2 Min/Mod assist (not at baseline)   High Extensive 5 or more Multiple options 3 or more Max/dependent (not at baseline       Signed by: Theador Hawthorne, RN  Date/Time: 12/08/2017, 9:25 AM

## 2017-12-08 NOTE — Progress Notes (Signed)
PULSE OXIMETRY TESTING: (For Home Oxygen)    Document patient's oxygen at rest on room air:     _____% on room air, at rest (NO Ranges please)    _____% on Oxygen, at rest at ____LPM via Nasal Canula      IF patient's saturation is 88% OR BELOW on room air, STOP Here,  IF NOT ambulate patient on room air with exertion and document below:    _____% on Room Air, with exertion (must be 88% or below)    _____% on Oxygen, with exertion, at ____LPM via NC    All three(3) tests must be during the same session.    RN:  Please copy and paste above template and document O2 saturations in a PROGRESS NOTE.    Testing to qualify for home Oxygen must be no earlier than 48 hours prior to discharge, or it will need to be repeated.    Please call the home health care liaison at [phone] when completed.  Thanks.

## 2017-12-08 NOTE — Discharge Instructions (Signed)
Medication Name: Aspirin   This Medication is used for:   Stroke   Heart Attack   Atrial Fibrillation(irregular heart Beat)     Common Side Effects are:   Increased Bleeding    Upset Stomach/Nausea    Heart Burn     A note from your nurse:  Call your nurse immediately if you notice itching, hives, swelling or trouble breathing     Medication Name: Sennoside(Senna)   This Medication is used for:   Constipation    Common Side Effects are:   Abdominal pain   Cramps   Diarrhea   Nausea    A note from your nurse:  Call your nurse immediately if you notice itching, hives, swelling or trouble breathing       Medication Name: Atorvastatin(Lipitor)   This Medication is used for:   Heart Attack   Stroke   High Cholesterol    Common Side Effects are:   Diarrhea    Headache/Dizziness    Muscle Pain   Elevated liver function tests    A note from your nurse:  Call your nurse immediately if you notice itching, hives, swelling or trouble breathing     Medication: Hydralazine(Apresoline)   This Medication is used for:   High Blood Pressure     Common Side Effects are:   Headache   Low blood pressure/Dizziness   Palpitations     A note from your nurse:  Call your nurse immediately if you notice itching, hives, swelling or trouble breathing     Medication: Metoprolol(Lopressor)   This Medication is used for:   High Blood Pressure   Congestive Heart Failure   Heart attack   Atrial Fibrillation(irregular heart beat)  Common Side Effects are:   Dizziness/Low Blood Pressure    Worsening of Shortness of Breath with Asthma or COPD   Fatigue    Change in Heart Rhythm (Heart Block)  A note from your nurse:  Call your nurse immediately if you notice itching, hives, swelling or trouble breathing       Medication: Lactobacillus(Risaquad)   This Medication is used for:   To promote good bacteria in the intestines    Common Side Effects are:   Gas   Bloating    A note from your nurse:  Call your nurse  immediately if you notice itching, hives, swelling or trouble breathing

## 2017-12-08 NOTE — Progress Notes (Signed)
12/08/17 1420   Discharge Disposition   Patient preference/choice provided? Yes   Physical Discharge Disposition Home, Home Health   CM Interventions   Follow up appointment scheduled? Yes  (12/09/17 at 3:00pm)   Referral made for home health RN visit? Yes  ([facility] already arranged.)

## 2017-12-08 NOTE — Progress Notes (Signed)
Patient discharged to home. Pt A&O x4, VSS. IV and telemetry d/c. Discharge papers/instructions/prescriptions/teaching material given to patient's son. Went over discharge instructions and follow up appointments.  Patient left PCU via wheelchair with tech to a waiting transportation.

## 2017-12-09 ENCOUNTER — Encounter (INDEPENDENT_AMBULATORY_CARE_PROVIDER_SITE_OTHER): Payer: Self-pay | Admitting: Family Medicine

## 2017-12-09 ENCOUNTER — Inpatient Hospital Stay (INDEPENDENT_AMBULATORY_CARE_PROVIDER_SITE_OTHER): Payer: Medicare Other | Admitting: Family Medicine

## 2017-12-09 MED ORDER — GABAPENTIN 100 MG PO CAPS
ORAL_CAPSULE | ORAL | 0 refills | Status: DC
Start: 2017-12-09 — End: 2018-01-02

## 2017-12-09 NOTE — Telephone Encounter (Signed)
Will fax to pharmacy on file and detailed message was left on patient's phone

## 2017-12-09 NOTE — Telephone Encounter (Signed)
Printed and signed please fax.

## 2017-12-09 NOTE — Progress Notes (Signed)
Transitional Care Management     Writer called pt at [phone]  to initiate CHF management. Writer left voicemail with reason for call and contact information. Return call requested. Writer will f/u on 9/9 for 48 hour call attempt.    Gae Dry, RN MSN  [facility]  Case Manager  [phone]

## 2017-12-12 ENCOUNTER — Encounter (INDEPENDENT_AMBULATORY_CARE_PROVIDER_SITE_OTHER): Payer: Self-pay

## 2017-12-12 NOTE — Progress Notes (Signed)
Transitional Care Management    Enrollment-    Name/Number of person who participated in call:  Marco Bruce, patient daughter, [phone]    TCM Program explained to patient and TCM contact information provided: Yes    HIPAA verification completed and notified that call is recorded:  Yes     Primary language spoken:    Interpreter needed:  No        Health History-    Health Status (PMH, current admission summary, previous admission history):  Past Medical History:   Diagnosis Date   . Acute diastolic heart failure 77/41/2878   . Acute on chronic diastolic congestive heart failure 08/13/2016   . Acute pulmonary edema 05/26/2015   . ARF (acute renal failure) 05/26/2015   . Chronic diastolic heart failure 67/67/2094   . Chronic gout    . Chronic pain syndrome 07/07/2015   . CKD (chronic kidney disease) stage 4, GFR 15-29 ml/min     2018 baseline creatinine in EPIC is 1.5-2.3.   . Congestive heart failure (CHF)     EF 40% 5/18 echo, on lasix   . Current chronic use of systemic steroids     For Polymyalgia rheumatica   . Essential hypertension    . Mixed hyperlipidemia    . NSTEMI (non-ST elevated myocardial infarction) 08/13/2016   . Osteoarthritis of both knees    . Peripheral edema 06/16/2015   . Pneumonia due to infectious organism, unspecified laterality, unspecified part of lung 06/25/2015   . Polymyalgia rheumatica     chronic steroids   . 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 Second degree heart block by electrocardiogram (ECG) 04/27/2015   . Type 2 diabetes mellitus with complication, with long-term current use of insulin     12/01/2016 Hemoglobin A1c 8.2%     Patient Active Problem List   Diagnosis   . Stage 4 chronic kidney disease   . Renovascular hypertension   . Hyponatremia   . Essential hypertension   . Acute on chronic diastolic congestive heart failure   . Hypokalemia   . Mixed hyperlipidemia   . Type 2 diabetes mellitus with complication, with long-term current use of insulin   . Acute gout of right knee    . Acute renal failure superimposed on stage 3 chronic kidney disease   . Acute bronchitis due to other specified organisms   . Pacemaker   . Bronchitis   . Diaphragm paralysis       Patient admitted to [facility] from 8/30-9/5. TCM admit for CHF management. Lace: 14. PMH significant for AMI, CKD, Gout, DM, pulmonary edema.     All eligible TCM Diagnoses (include A1C, EF, weight):         CHF    BNP: 1,110.2    5'2    170.6 pounds at d/c    How are you feeling? Patient daughter states "he is really doing great right now"    Are you feeling better or worse since your hospital discharge? better      If so, what symptoms are you experiencing? [name] denies any active CHF symptoms at this time-no edema, swelling, weight gain, SOB. [name] denies any active AMI symptoms-no chest pain, dizziness complaints from patient    Does the patient understand why he/she was admitted to the hospital:  Yes     Does the patient understand what the discharge diagnosis was: Yes       Medication Reconciliation-    Medication List:  Current Outpatient Prescriptions  Medication Sig Dispense Refill   . acetaminophen (TYLENOL) 325 MG tablet Take 2 tablets (650 mg total) by mouth every 4 (four) hours as needed for Fever (Temperature greater than 38 C/100.4 F).     Marland Kitchen allopurinol (ZYLOPRIM) 100 MG tablet Take 1 tablet (100 mg total) by mouth daily 90 tablet 3   . aspirin 81 MG chewable tablet Chew 1 tablet (81 mg total) by mouth daily     . atorvastatin (LIPITOR) 20 MG tablet Take 1 tablet (20 mg total) by mouth daily 90 tablet 0   . cetirizine (ZYRTEC) 10 MG tablet Take 1 tablet (10 mg total) by mouth daily as needed for Rhinitis.     Marland Kitchen dextromethorphan-guaiFENesin (MUCINEX DM) 30-600 MG per 12 hr tablet Take 1 tablet by mouth 2 (two) times daily as needed (cough)     . furosemide (LASIX) 20 MG tablet Take 2 tablets (40 mg total) by mouth daily 180 tablet 3   . gabapentin (NEURONTIN) 100 MG capsule TAKE ONE CAPSULE BY MOUTH EVERY 8 HOURS 90 capsule 0   .  hydrALAZINE (APRESOLINE) 50 MG tablet Take 1 tablet (50 mg total) by mouth every morning 90 tablet 3   . insulin glargine (LANTUS SOLOSTAR) 100 UNIT/ML injection pen Inject 10 Units into the skin nightly 9 mL 3   . linaGLIPtin 5 MG Tab Take 1 tablet (5 mg total) by mouth daily 90 tablet 0   . metoprolol succinate XL (TOPROL-XL) 25 MG 24 hr tablet Take 1 tablet (25 mg total) by mouth daily 90 tablet 1   . pantoprazole (PROTONIX) 40 MG tablet TAKE ONE TABLET BY MOUTH EVERY DAY 90 tablet 0   . Probiotic Product (RISAQUAD PO) Take 1 capsule by mouth daily.     Marland Kitchen senna-docusate (PERICOLACE) 8.6-50 MG per tablet Take 2 tablets by mouth 2 (two) times daily     . sodium bicarbonate 650 MG tablet Take 2 tablets (1,300 mg total) by mouth 2 (two) times daily 60 tablet 0   . valsartan (DIOVAN) 80 MG tablet Take 1 tablet (80 mg total) by mouth daily. 90 tablet 3     No current facility-administered medications for this visit.      Were the medications reviewed with the patient:  Yes     Was the patient able to obtain all of his/her medications when they left the hospital:  Yes     Does the patient understand what all of his/her medications are for: Yes    Patient aware of side effects from provided teaching in hospital or pamphlets: Yes     Was teaching done on medications: Yes     Plan for resolution of any medication concerns: Med rec completed. Patient daughter confirms all meds obtained. No questions or concerns. Writer reviewed all BP meds and diuretics. [name] stats glucose level checked before all meals. Morning glucose level is 125.    Physician Follow Up-    Was the patient scheduled/seen by PCP or cardiologist within critical timeframe of hospital discharge: No      Does patient understand importance of seeing a healthcare professional within critical timeframe of discharge: No      If appointment not scheduled within critical timeframe of discharge, what assistance offered to secure appointment:  Writer offered  assistance, [name] declined. Writer stressed importance of 3-5 day f/u    Name/number of PCP: Danella Sensing, MD, [phone]    Name/number of cardiologist: Dr. Jessy Oto, [phone]    Letter sent  to PCP notifying of TCM involvement: Yes    Attempted outreach to PCP/Specialist to discuss patient care plan and updates: Yes     If yes, information discussed: Writer called patient PCP office at [phone] to inform of TCM management. Writer spoke with Aaron Edelman who denied any PCP appointment. Writer informed of recent hospitalization and need for post hospital f/u appointment. Aaron Edelman verified office will reach out to patient daughter, [name], to set appointment up.        Home Health Co-Management-    Is patient being followed by Home Health: Yes     If yes:    Name/number of Home Health agency: [facility]    Letter sent to [place] agency notifying of TCM involvement: Yes    Summary of coordination/communication with Home Health agency staff: Writer called [place] to confirm [facility] date. Writer spoke with Santiago Glad who denied [place] and states "we have not been able to reach patient or family". Writer verified all contact information for patient and family. Santiago Glad confirmed attempting to call [phone] number and [phone] number (both listed in Demographics). Writer informed Santiago Glad of reaching patient daughter this morning. Santiago Glad states "I will pass this on to the scheduler and get them to reach out".     CHF Management-    What is patient's understanding of CHF: Writer reviewed all CHF self-care measures.     Does the patient have a scale: Yes    If no scale, what is the plan:     Does the patient weigh himself/herself daily: Yes    Does the patient understand the importance of when (in AM) s/he should be weighed and why (consistency and reporting): Yes    Has the patient's MD told him/her what his/her "dry weight" should be: No      What was weight since hospital discharge: 161.4 pounds    Is the patient  keeping a weight log: Yes    Does the patient understand why "dry weight" is important: Yes     Was the patient educated on the CHF red flags/symptoms: Yes   Weight gain of 2 pounds in a day or 5 pounds in a week  Increased swelling of your hands, abdomen, legs, or ankles   Increase in shortness of breath with activity  Increase in the number of pillows needed to sleep at night  New or more frequent chest pain or tightness  New onset of dizziness or new onset of dizziness or lightheadedness after standing up    Is the patient currently experiencing any of the CHF red flags/symptoms: No     Was the patient instructed on what to do if he/she is experiencing any of the CHF red flags/symptoms: Yes     Is patient familiar with the CHF zones: Yes    Education on zones provided: Yes    Was [facility] TCM visit initiated: No      Does patient meet EF requirements for cardiac rehab?  No    :        Call Summary Notes-Call completed with patient daughter, [name]. Med rec completed. No appointment scheduled. Patient daughter declined Engineer, agricultural. Writer contacted PCP office-[facility]. No HH visit completed d/t no contact made with patient/family. [name] denies any active CHF/AMI symptoms. Daily weight occurring. [name] aware of 1280ml/day fluid restriction and states "he only drinks about 646mL/day. Writer educated on importance of reaching close to daily fluid limit due to taking diuretic. Writer educated on kidney disease  and risk of further injury if dehydrated while on diuretic. [name] verbalized understanding.     HH to reach out to patient daughter today at 33 contact number. PCP office to reach out to [name] to schedule appointment for patient.     [name] states "my dad does not like the low salt diet but he is doing okay with it". Writer to continue to address dietary precautions to ensure patient appropriately adapting to proper dietary modifications.     Contact information provided and informed pt to call if any needs arise. CM will  follow up in a few days for check in call.     Gae Dry, RN MSN  [facility]  Case Manager  [phone]

## 2017-12-13 ENCOUNTER — Telehealth (INDEPENDENT_AMBULATORY_CARE_PROVIDER_SITE_OTHER): Payer: Self-pay | Admitting: Family Medicine

## 2017-12-13 MED ORDER — ALBUTEROL-IPRATROPIUM 2.5-0.5 (3) MG/3ML IN SOLN
3.00 mL | Freq: Two times a day (BID) | RESPIRATORY_TRACT | 0 refills | Status: DC | PRN
Start: 2017-12-13 — End: 2018-04-26

## 2017-12-13 NOTE — Telephone Encounter (Signed)
Medical records reviewed.  I called and spoke with the daughter now.  Patient has congestive heart failure.  At the hospital.  He was treated with nebulizer with DuoNeb.  The daughter is asking a refill on this medication.  She sees the ICU, M.D., had given him nebulizer order, but it does not have the liquid.  I discussed in detail with the daughter that the congestive heart failure will not improve on the DuoNeb, but mean weight monitoring and diuresis.  He could use the DuoNeb as occasional symptom relief but cannot be on a regular basis discussed.  Need hosp fu appt

## 2017-12-13 NOTE — Telephone Encounter (Signed)
Daughter, Marco Bruce called and stated that her father was in the hospital and was given a nebulizer for wheezing. Daughter states that the pharmacy has the prescription but does not have a "solution number" I do not see a nebulizer in pts medication list and daughter stated the physician in the hospital did not right the name of prescription on discharge paper. She has tried calling the hospital however they told her to contact her PCP. Please advise, thank you! She can best be reached at her work number [phone]. She stated to ask for herself.

## 2017-12-13 NOTE — Telephone Encounter (Signed)
Please see message from patient. Thanks.

## 2017-12-19 NOTE — Progress Notes (Signed)
Transitional Care Management     Writer called pt daughter, [name], at [phone] (home)   to continue CHF management. Writer left voicemail with reason for call and contact information. Return call requested. Writer will f/u next week.    Writer called [facility] and spoke with Roxie to ensure [facility] was provided. Roxie informed Probation officer that last reach out attempt was on 9/9 to both contact numbers and no answer/return call from patient/daughter. Roxie states "at this time no start of care initiated".     CM will f/u next week.     Gae Dry, RN MSN  [facility]  Case Manager  [phone]

## 2017-12-27 NOTE — Progress Notes (Signed)
Transitional Care Management    Writer contacted patient's daughter, Rehan Holness, at [phone] and [phone], and left voicemail for follow up at [phone].    Gorden Harms, MSW, LCSW, ACM-SW  Social Worker Case Freight forwarder II  [facility]  [phone]

## 2017-12-28 NOTE — Progress Notes (Signed)
Transitional Care Management     Writer called pt daughter, Marro, at [phone] (home)   to continue CHF management. Writer left voicemail with reason for call and contact information. Return call requested. Writer will f/u next week.    Writer called PCP office at [phone] and spoke with Tameka who informed writer that no f/u appointment completed since hospital discharge. Tameka reports last patient visit was 7/6.    F/u to continue.    Gae Dry, RN MSN  [facility]  Case Manager  [phone]

## 2017-12-29 ENCOUNTER — Telehealth (INDEPENDENT_AMBULATORY_CARE_PROVIDER_SITE_OTHER): Payer: Self-pay | Admitting: Family Medicine

## 2017-12-29 NOTE — Telephone Encounter (Signed)
Returned patient's daughters call. Advised her that I cannot give her this advice since it varies based on age and chronic health conditions and drug allergies. I did advise her that if he is sick he should be evaluated either by his PCP or walk in or urgent care services, especially due to his advanced age. Patient's daughter verbalized understanding but declines making an appointment at this time, stating she may bring him in Saturday to the walk in clinic.

## 2017-12-29 NOTE — Telephone Encounter (Signed)
Pt's daughter is requesting to be called back to know what over the counter medications pt could take for cold symptoms. She can best be reached at [phone]

## 2017-12-30 ENCOUNTER — Encounter (INDEPENDENT_AMBULATORY_CARE_PROVIDER_SITE_OTHER): Payer: Self-pay

## 2017-12-30 ENCOUNTER — Ambulatory Visit (INDEPENDENT_AMBULATORY_CARE_PROVIDER_SITE_OTHER): Payer: Medicare Other | Admitting: Registered Nurse

## 2017-12-30 VITALS — BP 122/62 | HR 72 | Temp 98.0°F | Resp 16 | Wt 159.0 lb

## 2017-12-30 DIAGNOSIS — R059 Cough, unspecified: Secondary | ICD-10-CM

## 2017-12-30 DIAGNOSIS — J069 Acute upper respiratory infection, unspecified: Secondary | ICD-10-CM

## 2017-12-30 DIAGNOSIS — R05 Cough: Secondary | ICD-10-CM

## 2017-12-30 MED ORDER — AZITHROMYCIN 250 MG PO TABS
ORAL_TABLET | ORAL | 0 refills | Status: AC
Start: 2017-12-30 — End: 2018-01-04

## 2017-12-30 NOTE — Progress Notes (Addendum)
[facility]    PROGRESS NOTE      Patient: Marco Bruce   Date: 12/30/2017   MRN: [id number]     Past Medical History:   Diagnosis Date   . Acute diastolic heart failure 37/85/8850   . Acute on chronic diastolic congestive heart failure 08/13/2016   . Acute pulmonary edema 05/26/2015   . ARF (acute renal failure) 05/26/2015   . Chronic diastolic heart failure 27/74/1287   . Chronic gout    . Chronic pain syndrome 07/07/2015   . CKD (chronic kidney disease) stage 4, GFR 15-29 ml/min     2018 baseline creatinine in EPIC is 1.5-2.3.   . Congestive heart failure (CHF)     EF 40% 5/18 echo, on lasix   . Current chronic use of systemic steroids     For Polymyalgia rheumatica   . Essential hypertension    . Mixed hyperlipidemia    . NSTEMI (non-ST elevated myocardial infarction) 08/13/2016   . Osteoarthritis of both knees    . Peripheral edema 06/16/2015   . Pneumonia due to infectious organism, unspecified laterality, unspecified part of lung 06/25/2015   . Polymyalgia rheumatica     chronic steroids   . 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 Second degree heart block by electrocardiogram (ECG) 04/27/2015   . Type 2 diabetes mellitus with complication, with long-term current use of insulin     12/01/2016 Hemoglobin A1c 8.2%     Social History     Socioeconomic History   . Marital status: Widowed     Spouse name: Not on file   . Number of children: Not on file   . Years of education: Not on file   . Highest education level: Not on file   Occupational History   . Not on file   Social Needs   . Financial resource strain: Not on file   . Food insecurity:     Worry: Not on file     Inability: Not on file   . Transportation needs:     Medical: Not on file     Non-medical: Not on file   Tobacco Use   . Smoking status: Never Smoker   . Smokeless tobacco: Never Used   Substance and Sexual Activity   . Alcohol use: No     Comment: former alcoholic   . Drug use: No   . Sexual activity: Not on file   Lifestyle   . Physical  activity:     Days per week: Not on file     Minutes per session: Not on file   . Stress: Not on file   Relationships   . Social connections:     Talks on phone: Not on file     Gets together: Not on file     Attends religious service: Not on file     Active member of club or organization: Not on file     Attends meetings of clubs or organizations: Not on file     Relationship status: Not on file   . Intimate partner violence:     Fear of current or ex partner: Not on file     Emotionally abused: Not on file     Physically abused: Not on file     Forced sexual activity: Not on file   Other Topics Concern   . Not on file   Social History Narrative   . Not on file     Family  History   Problem Relation Age of Onset   . Hypertension Father        ASSESSMENT/PLAN     Marco Bruce is a 84 y.o. male    Chief Complaint   Patient presents with   . Cough        1. Upper respiratory tract infection, unspecified type  - azithromycin (ZITHROMAX) 250 MG tablet; Two tabs PO day 1 and then one tab PO daily days 2-5  Dispense: 6 tablet; Refill: 0  - X-ray chest PA and lateral    2. Cough  - azithromycin (ZITHROMAX) 250 MG tablet; Two tabs PO day 1 and then one tab PO daily days 2-5  Dispense: 6 tablet; Refill: 0  - X-ray chest PA and lateral    Advise pt to make an appointment with DR. Anbarasan for ER follow up.     Increasing fluid 8-10 cups , rest, Utilize throat lozenges, warm salt water gargles, and chloraseptic nasal spray. Vitamin C 1000mg  once  a day. Tylenol as needed for sore throat or bodyache. Zyrtec 10mg  once a day for running nose as needed. Mucinex OTC as needed for cough.      No results found for this or any previous visit (from the past 24 hour(s)).      Risk & Benefits of the new medication(s) were explained to the patient (and family) who verbalized understanding & agreed to the treatment plan. Patient (family) encouraged to contact me/clinical staff with any questions/concerns      MEDICATIONS     Outpatient  Medications Marked as Taking for the 12/30/17 encounter (Office Visit) with Rosalio Macadamia, FNP   Medication Sig Dispense Refill   . acetaminophen (TYLENOL) 325 MG tablet Take 2 tablets (650 mg total) by mouth every 4 (four) hours as needed for Fever (Temperature greater than 38 C/100.4 F).     Marland Kitchen albuterol-ipratropium (DUO-NEB) 2.5-0.5(3) mg/3 mL nebulizer Take 3 mLs by nebulization 2 (two) times daily as needed (SOB) 3 mL 0   . allopurinol (ZYLOPRIM) 100 MG tablet Take 1 tablet (100 mg total) by mouth daily 90 tablet 3   . aspirin 81 MG chewable tablet Chew 1 tablet (81 mg total) by mouth daily     . atorvastatin (LIPITOR) 20 MG tablet Take 1 tablet (20 mg total) by mouth daily 90 tablet 0   . cetirizine (ZYRTEC) 10 MG tablet Take 1 tablet (10 mg total) by mouth daily as needed for Rhinitis.     Marland Kitchen dextromethorphan-guaiFENesin (MUCINEX DM) 30-600 MG per 12 hr tablet Take 1 tablet by mouth 2 (two) times daily as needed (cough)     . furosemide (LASIX) 20 MG tablet Take 2 tablets (40 mg total) by mouth daily 180 tablet 3   . gabapentin (NEURONTIN) 100 MG capsule TAKE ONE CAPSULE BY MOUTH EVERY 8 HOURS 90 capsule 0   . hydrALAZINE (APRESOLINE) 50 MG tablet Take 1 tablet (50 mg total) by mouth every morning 90 tablet 3   . insulin glargine (LANTUS SOLOSTAR) 100 UNIT/ML injection pen Inject 10 Units into the skin nightly 9 mL 3   . linaGLIPtin 5 MG Tab Take 1 tablet (5 mg total) by mouth daily 90 tablet 0   . metoprolol succinate XL (TOPROL-XL) 25 MG 24 hr tablet Take 1 tablet (25 mg total) by mouth daily 90 tablet 1   . pantoprazole (PROTONIX) 40 MG tablet TAKE ONE TABLET BY MOUTH EVERY DAY 90 tablet 0   . Probiotic Product (RISAQUAD  PO) Take 1 capsule by mouth daily.     Marland Kitchen senna-docusate (PERICOLACE) 8.6-50 MG per tablet Take 2 tablets by mouth 2 (two) times daily     . sodium bicarbonate 650 MG tablet Take 2 tablets (1,300 mg total) by mouth 2 (two) times daily 60 tablet 0   . valsartan (DIOVAN) 80 MG tablet Take 1 tablet  (80 mg total) by mouth daily. 90 tablet 3         No Known Allergies    SUBJECTIVE     Chief Complaint   Patient presents with   . Cough        Cough   This is a new problem. Episode onset: 1 week. The cough is productive of sputum. Associated symptoms include postnasal drip and rhinorrhea. Pertinent negatives include no chest pain, chills, fever, headaches, rash, sore throat, shortness of breath or wheezing. Associated symptoms comments: fatigue. He has tried nothing for the symptoms.       ROS     Review of Systems   Constitutional: Negative for appetite change, chills, diaphoresis, fatigue, fever and unexpected weight change.        Going to see his cardiologist in [place]. Has not see PCP since the ER visit.    HENT: Positive for congestion, postnasal drip and rhinorrhea. Negative for sinus pressure, sneezing, sore throat and trouble swallowing.    Respiratory: Positive for cough (Accompnanied by his grandson, productive coughing over a week. ). Negative for chest tightness, shortness of breath and wheezing.    Cardiovascular: Negative for chest pain.   Gastrointestinal: Negative for abdominal distention, nausea and vomiting.   Skin: Negative for rash.   Neurological: Negative for dizziness and headaches.       The following sections were reviewed this encounter by the provider:   Tobacco  Allergies  Meds  Problems  Med Hx  Surg Hx  Fam Hx         PHYSICAL EXAM     Vitals:    12/30/17 0843   BP: 122/62   BP Site: Left arm   Patient Position: Sitting   Cuff Size: Medium   Pulse: 72   Resp: 16   Temp: 98 F (36.7 C)   TempSrc: Oral   SpO2: 97%   Weight: 72.1 kg (159 lb)       Physical Exam  Vitals signs and nursing note reviewed.   Constitutional:       General: He is not in acute distress.     Appearance: He is well-developed.   HENT:      Head: Normocephalic.      Right Ear: Tympanic membrane, ear canal and external ear normal.      Left Ear: Tympanic membrane, ear canal and external ear normal.      Nose:  Nose normal.      Mouth/Throat:      Pharynx: Uvula midline. No oropharyngeal exudate or posterior oropharyngeal erythema (postnasal drip noted).   Eyes:      Conjunctiva/sclera: Conjunctivae normal.      Pupils: Pupils are equal, round, and reactive to light.   Neck:      Musculoskeletal: Normal range of motion and neck supple.   Cardiovascular:      Rate and Rhythm: Normal rate and regular rhythm.      Heart sounds: Normal heart sounds. No murmur.   Pulmonary:      Effort: Pulmonary effort is normal. No respiratory distress.      Breath  sounds: Normal breath sounds.   Neurological:      Mental Status: He is alert and oriented to person, place, and time.       Ortho Exam  Neurologic Exam     Mental Status   Oriented to person, place, and time.     Cranial Nerves     CN III, IV, VI   Pupils are equal, round, and reactive to light.      PROCEDURE(S)     Procedures        Signed,  Rosalio Macadamia, FNP  12/30/2017

## 2017-12-30 NOTE — Telephone Encounter (Signed)
Late entry.  Spoke with daughter in law today.  Advised saline nose drops, steam inhalation, use nebulizer when necessary.  He has multiple medical problems including congestive heart failure, so he needs to be seen.  She voiced understanding

## 2018-01-02 ENCOUNTER — Other Ambulatory Visit (INDEPENDENT_AMBULATORY_CARE_PROVIDER_SITE_OTHER): Payer: Self-pay | Admitting: Family Medicine

## 2018-01-02 DIAGNOSIS — M79605 Pain in left leg: Secondary | ICD-10-CM

## 2018-01-02 DIAGNOSIS — K219 Gastro-esophageal reflux disease without esophagitis: Secondary | ICD-10-CM

## 2018-01-02 DIAGNOSIS — M170 Bilateral primary osteoarthritis of knee: Secondary | ICD-10-CM

## 2018-01-02 NOTE — Telephone Encounter (Signed)
LOV 08/08/17    Last Refill gabapentin 12/09/17 # 90 with 0 RF  Pantoprazole 10/05/17 # 90 with 0 RF    Also requesting refill on chlorthalidone 50 mg tabs which was d/c'd by Mliss Fritz, MD on 06/10/17    Last in house Labs 08/08/17 but there are more recent hospital labs     Next appt 01/06/18

## 2018-01-03 MED ORDER — GABAPENTIN 100 MG PO CAPS
ORAL_CAPSULE | ORAL | 0 refills | Status: DC
Start: 2018-01-03 — End: 2018-01-10

## 2018-01-03 MED ORDER — PANTOPRAZOLE SODIUM 40 MG PO TBEC
DELAYED_RELEASE_TABLET | ORAL | 0 refills | Status: DC
Start: 2018-01-03 — End: 2018-01-07

## 2018-01-05 NOTE — Progress Notes (Signed)
Transitional Care Management     Writer called pt at [phone] (home)   to continue CHF management. Writer left voicemail with reason for call and contact information. Return call requested. Writer will f/u next week for final attempt.    Gae Dry, RN MSN  [facility]  Case Manager  [phone]

## 2018-01-06 ENCOUNTER — Telehealth (INDEPENDENT_AMBULATORY_CARE_PROVIDER_SITE_OTHER): Payer: Self-pay | Admitting: Family Medicine

## 2018-01-06 ENCOUNTER — Ambulatory Visit (INDEPENDENT_AMBULATORY_CARE_PROVIDER_SITE_OTHER): Payer: Medicare Other | Admitting: Family Medicine

## 2018-01-06 NOTE — Telephone Encounter (Signed)
Pt daughter called asking if Dr Herbert Pun can prescribe or write a prescription for  Prednisone due to  pt leg pain.  Daughter stated that she can drop by at the office today to pick up the prescription.  Pls advise    Pt info  [phone] (M)  [phone] (H)     Thank you

## 2018-01-06 NOTE — Telephone Encounter (Signed)
Spoke with patient's daughter who stated patient is having bilateral knee pain from his arthritis. She is unsure which knee, but believes right knee is worse than left. Advised patient that Dr. Herbert Pun is out of the office today and will not return until Monday. Patient's daughter was asking if another provider could prescribe him the prednisone. Informed patient that another provider could not prescribe him this without being seen. Advised patient's daughter that if needed, she could take him to an urgent care to be evaluated if she felt he could not wait for an appointment with Dr. Herbert Pun. Patient's daughter agreeable to plan and will call back to schedule appointment with Dr. Herbert Pun

## 2018-01-07 ENCOUNTER — Emergency Department: Payer: Medicare Other

## 2018-01-07 ENCOUNTER — Inpatient Hospital Stay
Admission: EM | Admit: 2018-01-07 | Discharge: 2018-01-10 | DRG: 280 | Disposition: A | Discharge status: Home Health | Payer: Medicare Other | Attending: Hospitalist | Admitting: Hospitalist

## 2018-01-07 DIAGNOSIS — Z7952 Long term (current) use of systemic steroids: Secondary | ICD-10-CM

## 2018-01-07 DIAGNOSIS — N184 Chronic kidney disease, stage 4 (severe): Secondary | ICD-10-CM | POA: Diagnosis present

## 2018-01-07 DIAGNOSIS — I5033 Acute on chronic diastolic (congestive) heart failure: Secondary | ICD-10-CM

## 2018-01-07 DIAGNOSIS — M10061 Idiopathic gout, right knee: Secondary | ICD-10-CM | POA: Diagnosis present

## 2018-01-07 DIAGNOSIS — I1310 Hypertensive heart and chronic kidney disease without heart failure, with stage 1 through stage 4 chronic kidney disease, or unspecified chronic kidney disease: Secondary | ICD-10-CM | POA: Diagnosis present

## 2018-01-07 DIAGNOSIS — Z5329 Procedure and treatment not carried out because of patient's decision for other reasons: Secondary | ICD-10-CM | POA: Diagnosis present

## 2018-01-07 DIAGNOSIS — Z720 Tobacco use: Secondary | ICD-10-CM

## 2018-01-07 DIAGNOSIS — Z9115 Patient's noncompliance with renal dialysis: Secondary | ICD-10-CM

## 2018-01-07 DIAGNOSIS — Z953 Presence of xenogenic heart valve: Secondary | ICD-10-CM

## 2018-01-07 DIAGNOSIS — M170 Bilateral primary osteoarthritis of knee: Secondary | ICD-10-CM

## 2018-01-07 DIAGNOSIS — E872 Acidosis: Secondary | ICD-10-CM | POA: Diagnosis present

## 2018-01-07 DIAGNOSIS — M109 Gout, unspecified: Secondary | ICD-10-CM | POA: Diagnosis present

## 2018-01-07 DIAGNOSIS — I428 Other cardiomyopathies: Secondary | ICD-10-CM | POA: Diagnosis present

## 2018-01-07 DIAGNOSIS — E1165 Type 2 diabetes mellitus with hyperglycemia: Secondary | ICD-10-CM | POA: Diagnosis present

## 2018-01-07 DIAGNOSIS — Z79899 Other long term (current) drug therapy: Secondary | ICD-10-CM

## 2018-01-07 DIAGNOSIS — E1122 Type 2 diabetes mellitus with diabetic chronic kidney disease: Secondary | ICD-10-CM | POA: Diagnosis present

## 2018-01-07 DIAGNOSIS — Z7982 Long term (current) use of aspirin: Secondary | ICD-10-CM

## 2018-01-07 DIAGNOSIS — M10062 Idiopathic gout, left knee: Secondary | ICD-10-CM | POA: Diagnosis present

## 2018-01-07 DIAGNOSIS — M545 Low back pain, unspecified: Secondary | ICD-10-CM

## 2018-01-07 DIAGNOSIS — I5023 Acute on chronic systolic (congestive) heart failure: Secondary | ICD-10-CM

## 2018-01-07 DIAGNOSIS — Z794 Long term (current) use of insulin: Secondary | ICD-10-CM

## 2018-01-07 DIAGNOSIS — Z950 Presence of cardiac pacemaker: Secondary | ICD-10-CM

## 2018-01-07 DIAGNOSIS — R1110 Vomiting, unspecified: Secondary | ICD-10-CM

## 2018-01-07 DIAGNOSIS — I214 Non-ST elevation (NSTEMI) myocardial infarction: Principal | ICD-10-CM | POA: Diagnosis present

## 2018-01-07 DIAGNOSIS — E782 Mixed hyperlipidemia: Secondary | ICD-10-CM | POA: Diagnosis present

## 2018-01-07 DIAGNOSIS — R9431 Abnormal electrocardiogram [ECG] [EKG]: Secondary | ICD-10-CM

## 2018-01-07 DIAGNOSIS — N185 Chronic kidney disease, stage 5: Secondary | ICD-10-CM | POA: Diagnosis present

## 2018-01-07 DIAGNOSIS — I5043 Acute on chronic combined systolic (congestive) and diastolic (congestive) heart failure: Secondary | ICD-10-CM | POA: Diagnosis present

## 2018-01-07 LAB — CBC AND DIFFERENTIAL
Absolute NRBC: 0 10*3/uL (ref 0.00–0.00)
Basophils Absolute Automated: 0.03 10*3/uL (ref 0.00–0.08)
Basophils Automated: 0.3 %
Eosinophils Absolute Automated: 0.03 10*3/uL (ref 0.00–0.44)
Eosinophils Automated: 0.3 %
Hematocrit: 35.7 % — ABNORMAL LOW (ref 37.6–49.6)
Hgb: 11.7 g/dL — ABNORMAL LOW (ref 12.5–17.1)
Immature Granulocytes Absolute: 0.05 10*3/uL (ref 0.00–0.07)
Immature Granulocytes: 0.5 %
Lymphocytes Absolute Automated: 2.05 10*3/uL (ref 0.42–3.22)
Lymphocytes Automated: 22.1 %
MCH: 30.2 pg (ref 25.1–33.5)
MCHC: 32.8 g/dL (ref 31.5–35.8)
MCV: 92.2 fL (ref 78.0–96.0)
MPV: 10.1 fL (ref 8.9–12.5)
Monocytes Absolute Automated: 0.88 10*3/uL — ABNORMAL HIGH (ref 0.21–0.85)
Monocytes: 9.5 %
Neutrophils Absolute: 6.24 10*3/uL (ref 1.10–6.33)
Neutrophils: 67.3 %
Nucleated RBC: 0 /100 WBC (ref 0.0–0.0)
Platelets: 123 10*3/uL — ABNORMAL LOW (ref 142–346)
RBC: 3.87 10*6/uL — ABNORMAL LOW (ref 4.20–5.90)
RDW: 14 % (ref 11–15)
WBC: 9.28 10*3/uL (ref 3.10–9.50)

## 2018-01-07 LAB — URINALYSIS, REFLEX TO MICROSCOPIC EXAM IF INDICATED
Bilirubin, UA: NEGATIVE
Glucose, UA: 50 — AB
Ketones UA: 20 — AB
Leukocyte Esterase, UA: NEGATIVE
Nitrite, UA: NEGATIVE
Protein, UR: >300 — AB
Specific Gravity UA: 1.017 (ref 1.001–1.035)
Urine pH: 5 (ref 5.0–8.0)
Urobilinogen, UA: NORMAL mg/dL (ref 0.2–2.0)

## 2018-01-07 LAB — BASIC METABOLIC PANEL
BUN: 38 mg/dL — ABNORMAL HIGH (ref 9.0–28.0)
CO2: 15 mEq/L — ABNORMAL LOW (ref 22–29)
Calcium: 9.6 mg/dL (ref 7.9–10.2)
Chloride: 103 mEq/L (ref 100–111)
Creatinine: 3 mg/dL — ABNORMAL HIGH (ref 0.7–1.3)
Glucose: 236 mg/dL — ABNORMAL HIGH (ref 70–100)
Potassium: 4.9 mEq/L (ref 3.5–5.1)
Sodium: 135 mEq/L — ABNORMAL LOW (ref 136–145)

## 2018-01-07 LAB — I-STAT CG4 VENOUS CARTRIDGE
Lactic Acid I-Stat: 1.4 mmol/L (ref 0.2–2.0)
i-STAT Base Excess Venous: -4 mEq/L
i-STAT FIO2: 21
i-STAT HCO3 Bicarbonate Venous: 20 mEq/L
i-STAT O2 Saturation Venous: 67 %
i-STAT Patient Temperature: 99
i-STAT Total CO2 Venous: 21 mEq/L — ABNORMAL LOW (ref 24.0–29.0)
i-STAT pCO2 Venous: 33.3
i-STAT pH Venous: 7.387
i-STAT pO2 Venous: 35

## 2018-01-07 LAB — HEPATIC FUNCTION PANEL
ALT: 6 U/L (ref 0–55)
AST (SGOT): 41 U/L — ABNORMAL HIGH (ref 5–34)
Albumin/Globulin Ratio: 1.5 (ref 0.9–2.2)
Albumin: 4.1 g/dL (ref 3.5–5.0)
Alkaline Phosphatase: 45 U/L (ref 38–106)
Bilirubin Direct: 0.4 mg/dL (ref 0.0–0.5)
Bilirubin Indirect: 0.6 mg/dL (ref 0.2–1.0)
Bilirubin, Total: 1 mg/dL (ref 0.2–1.2)
Globulin: 2.8 g/dL (ref 2.0–3.6)
Protein, Total: 6.9 g/dL (ref 6.0–8.3)

## 2018-01-07 LAB — PT AND APTT
PT INR: 1.2 — ABNORMAL HIGH (ref 0.9–1.1)
PT: 15 s (ref 12.6–15.0)
PTT: 46 s — ABNORMAL HIGH (ref 23–37)

## 2018-01-07 LAB — B-TYPE NATRIURETIC PEPTIDE: B-Natriuretic Peptide: 4529 pg/mL — ABNORMAL HIGH (ref 0–100)

## 2018-01-07 LAB — TYPE AND SCREEN
AB Screen Gel: NEGATIVE
ABO Rh: O POS

## 2018-01-07 LAB — TROPONIN I
Troponin I: 9.83 ng/mL (ref 0.00–0.05)
Troponin I: 9.95 ng/mL (ref 0.00–0.05)

## 2018-01-07 LAB — GFR: EGFR: 19.8

## 2018-01-07 LAB — I-STAT TROPONIN: i-STAT Troponin: 5.7 ng/mL — ABNORMAL HIGH (ref 0.00–0.09)

## 2018-01-07 LAB — LIPASE: Lipase: 17 U/L (ref 8–78)

## 2018-01-07 MED ORDER — HEPARIN SODIUM (PORCINE) 5000 UNIT/ML IJ SOLN
4000.00 [IU] | Freq: Once | INTRAMUSCULAR | Status: AC
Start: 2018-01-07 — End: 2018-01-07
  Administered 2018-01-07: 21:00:00 4000 [IU] via INTRAVENOUS
  Filled 2018-01-07: qty 1

## 2018-01-07 MED ORDER — INSULIN LISPRO 100 UNIT/ML SC SOLN
1.00 [IU] | Freq: Three times a day (TID) | SUBCUTANEOUS | Status: DC
Start: 2018-01-08 — End: 2018-01-10
  Administered 2018-01-08 (×2): 2 [IU] via SUBCUTANEOUS
  Administered 2018-01-10: 09:00:00 1 [IU] via SUBCUTANEOUS
  Administered 2018-01-10: 13:00:00 2 [IU] via SUBCUTANEOUS
  Filled 2018-01-07: qty 3
  Filled 2018-01-07 (×4): qty 6

## 2018-01-07 MED ORDER — ASPIRIN 81 MG PO CHEW
81.00 mg | CHEWABLE_TABLET | Freq: Every day | ORAL | Status: DC
Start: 2018-01-08 — End: 2018-01-10
  Administered 2018-01-08 – 2018-01-10 (×3): 81 mg via ORAL
  Filled 2018-01-07 (×3): qty 1

## 2018-01-07 MED ORDER — GLUCAGON 1 MG IJ SOLR (WRAP)
1.00 mg | INTRAMUSCULAR | Status: DC | PRN
Start: 2018-01-07 — End: 2018-01-10

## 2018-01-07 MED ORDER — NALOXONE HCL 0.4 MG/ML IJ SOLN (WRAP)
0.20 mg | INTRAMUSCULAR | Status: DC | PRN
Start: 2018-01-07 — End: 2018-01-10

## 2018-01-07 MED ORDER — HEPARIN (PORCINE) IN D5W 50-5 UNIT/ML-% IV SOLN (UNITS/KG/HR ONLY)
12.00 [IU]/kg/h | INTRAVENOUS | Status: DC
Start: 2018-01-07 — End: 2018-01-08
  Administered 2018-01-07: 21:00:00 12 [IU]/kg/h via INTRAVENOUS
  Administered 2018-01-08: 08:00:00 9 [IU]/kg/h via INTRAVENOUS
  Filled 2018-01-07: qty 500

## 2018-01-07 MED ORDER — DEXTROSE 50 % IV SOLN
12.50 g | INTRAVENOUS | Status: DC | PRN
Start: 2018-01-07 — End: 2018-01-10

## 2018-01-07 MED ORDER — GABAPENTIN 100 MG PO CAPS
100.00 mg | ORAL_CAPSULE | Freq: Three times a day (TID) | ORAL | Status: DC
Start: 2018-01-07 — End: 2018-01-10
  Administered 2018-01-08 – 2018-01-10 (×7): 100 mg via ORAL
  Filled 2018-01-07 (×8): qty 1

## 2018-01-07 MED ORDER — INSULIN GLARGINE 100 UNIT/ML SC SOLN
10.00 [IU] | Freq: Every evening | SUBCUTANEOUS | Status: DC
Start: 2018-01-07 — End: 2018-01-10
  Administered 2018-01-08 – 2018-01-09 (×3): 10 [IU] via SUBCUTANEOUS
  Filled 2018-01-07 (×2): qty 10
  Filled 2018-01-07: qty 1

## 2018-01-07 MED ORDER — SODIUM BICARBONATE 650 MG PO TABS
1300.00 mg | ORAL_TABLET | Freq: Two times a day (BID) | ORAL | Status: DC
Start: 2018-01-07 — End: 2018-01-10
  Administered 2018-01-08 – 2018-01-10 (×6): 1300 mg via ORAL
  Filled 2018-01-07 (×6): qty 2

## 2018-01-07 MED ORDER — ATORVASTATIN CALCIUM 20 MG PO TABS
20.00 mg | ORAL_TABLET | Freq: Every evening | ORAL | Status: DC
Start: 2018-01-07 — End: 2018-01-08
  Filled 2018-01-07: qty 1

## 2018-01-07 MED ORDER — METOPROLOL SUCCINATE ER 25 MG PO TB24
12.50 mg | ORAL_TABLET | Freq: Every day | ORAL | Status: DC
Start: 2018-01-07 — End: 2018-01-10
  Administered 2018-01-08 – 2018-01-10 (×3): 12.5 mg via ORAL
  Filled 2018-01-07 (×4): qty 1

## 2018-01-07 MED ORDER — GLUCOSE 40 % PO GEL
15.00 g | ORAL | Status: DC | PRN
Start: 2018-01-07 — End: 2018-01-10

## 2018-01-07 MED ORDER — RISAQUAD PO CAPS
1.00 | ORAL_CAPSULE | Freq: Every day | ORAL | Status: DC
Start: 2018-01-08 — End: 2018-01-10
  Administered 2018-01-08 – 2018-01-10 (×3): 1 via ORAL
  Filled 2018-01-07 (×3): qty 1

## 2018-01-07 MED ORDER — INSULIN LISPRO 100 UNIT/ML SC SOLN
1.00 [IU] | Freq: Every evening | SUBCUTANEOUS | Status: DC
Start: 2018-01-08 — End: 2018-01-10

## 2018-01-07 MED ORDER — SODIUM CHLORIDE 0.9 % IV BOLUS
1000.00 mL | Freq: Once | INTRAVENOUS | Status: AC
Start: 2018-01-07 — End: 2018-01-07
  Administered 2018-01-07: 19:00:00 1000 mL via INTRAVENOUS

## 2018-01-07 MED ORDER — ACETAMINOPHEN 500 MG PO TABS
1000.00 mg | ORAL_TABLET | Freq: Once | ORAL | Status: AC
Start: 2018-01-07 — End: 2018-01-07
  Administered 2018-01-07: 20:00:00 1000 mg via ORAL
  Filled 2018-01-07: qty 2

## 2018-01-07 MED ORDER — SODIUM CHLORIDE 0.9 % IV BOLUS
1000.00 mL | Freq: Once | INTRAVENOUS | Status: DC
Start: 2018-01-07 — End: 2018-01-09

## 2018-01-07 MED ORDER — ONDANSETRON 4 MG PO TBDP
4.00 mg | ORAL_TABLET | Freq: Once | ORAL | Status: AC
Start: 2018-01-07 — End: 2018-01-07
  Administered 2018-01-07: 19:00:00 4 mg via ORAL
  Filled 2018-01-07: qty 1

## 2018-01-07 MED ORDER — CETIRIZINE HCL 10 MG PO TABS
10.00 mg | ORAL_TABLET | Freq: Every day | ORAL | Status: DC
Start: 2018-01-08 — End: 2018-01-10
  Administered 2018-01-08 – 2018-01-10 (×3): 10 mg via ORAL
  Filled 2018-01-07 (×3): qty 1

## 2018-01-07 MED ORDER — SENNOSIDES-DOCUSATE SODIUM 8.6-50 MG PO TABS
2.00 | ORAL_TABLET | Freq: Two times a day (BID) | ORAL | Status: DC
Start: 2018-01-08 — End: 2018-01-10
  Administered 2018-01-08 – 2018-01-09 (×4): 2 via ORAL
  Filled 2018-01-07 (×5): qty 2

## 2018-01-07 MED ORDER — INSULIN REGULAR HUMAN 100 UNIT/ML IJ SOLN
5.00 [IU] | Freq: Once | INTRAMUSCULAR | Status: AC
Start: 2018-01-07 — End: 2018-01-07
  Administered 2018-01-07: 20:00:00 5 [IU] via INTRAVENOUS
  Filled 2018-01-07: qty 3

## 2018-01-07 MED ORDER — PANTOPRAZOLE SODIUM 40 MG PO TBEC
40.00 mg | DELAYED_RELEASE_TABLET | Freq: Every morning | ORAL | Status: DC
Start: 2018-01-08 — End: 2018-01-10
  Administered 2018-01-08 – 2018-01-10 (×3): 40 mg via ORAL
  Filled 2018-01-07 (×3): qty 1

## 2018-01-07 MED ORDER — ACETAMINOPHEN 325 MG PO TABS
650.00 mg | ORAL_TABLET | Freq: Four times a day (QID) | ORAL | Status: DC
Start: 2018-01-08 — End: 2018-01-10
  Administered 2018-01-08 – 2018-01-10 (×9): 650 mg via ORAL
  Filled 2018-01-07 (×10): qty 2

## 2018-01-07 MED ORDER — ALLOPURINOL 100 MG PO TABS
100.00 mg | ORAL_TABLET | Freq: Every day | ORAL | Status: DC
Start: 2018-01-08 — End: 2018-01-10
  Administered 2018-01-08 – 2018-01-10 (×3): 100 mg via ORAL
  Filled 2018-01-07 (×3): qty 1

## 2018-01-07 MED ORDER — LIDOCAINE 5 % EX PTCH
2.00 | MEDICATED_PATCH | CUTANEOUS | Status: DC
Start: 2018-01-08 — End: 2018-01-10
  Administered 2018-01-08 – 2018-01-10 (×3): 2 via TRANSDERMAL
  Filled 2018-01-07 (×3): qty 2

## 2018-01-07 NOTE — ED Notes (Signed)
Took 1g of tylenol this morning w/o relief.

## 2018-01-07 NOTE — Consults (Addendum)
INTERVENTIONAL CARDIOLOGY ON-CALL CONSULTATION    Date Time: 01/07/18 8:14 PM  Patient Name: [name]  Requesting Physician: Molly Maduro, MD  Admission Date: 01/07/2018   Primary Cardiologist: Darnelle Spangle ([facility])    Reason for Consultation:   Acute coronary syndrome   History:   Marco Bruce is a 84 y.o. male with a history of stage C cardiomyopathy (LVEF 40% with moderate MR), pacemaker in place, CKD IV but has declined HD in the past, HTN and DM for whom we have been asked to provide cardiac consultation re: acute coronary syndrome.    Pt history obtained from daughter who was present in the room and provided translation. She reports that he has been feeling increasingly weak over the last 24 hours. First started to complain of bilateral knee pain and now nausea and vomiting. No CP or SOB. At baseline has underlying frailty as he uses a walker for ambulation. Labs notable for BUN/creatinine 38/3.0, troponin I 9.83 and BNP 4529, lactate 1.4. 12 lead EKG ventricular paced rhythm 80 bpm ? ST elevation in I and aVL. Recently hospitalized in September with acute on chronic systolic HF and ? Infection. Pt at that time still continued to decline HD.    Past Medical History:     Past Medical History:   Diagnosis Date   . Acute diastolic heart failure 33/38/3291   . Acute on chronic diastolic congestive heart failure 08/13/2016   . Acute pulmonary edema 05/26/2015   . ARF (acute renal failure) 05/26/2015   . Chronic diastolic heart failure 91/66/0600   . Chronic gout    . Chronic pain syndrome 07/07/2015   . CKD (chronic kidney disease) stage 4, GFR 15-29 ml/min     2018 baseline creatinine in EPIC is 1.5-2.3.   . Congestive heart failure (CHF)     EF 40% 5/18 echo, on lasix   . Current chronic use of systemic steroids     For Polymyalgia rheumatica   . Essential hypertension    . Mixed hyperlipidemia    . NSTEMI (non-ST elevated myocardial infarction) 08/13/2016   . Osteoarthritis of both knees    . Peripheral edema  06/16/2015   . Pneumonia due to infectious organism, unspecified laterality, unspecified part of lung 06/25/2015   . Polymyalgia rheumatica     chronic steroids   . 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 Second degree heart block by electrocardiogram (ECG) 04/27/2015   . Type 2 diabetes mellitus with complication, with long-term current use of insulin     12/01/2016 Hemoglobin A1c 8.2%      Past Surgical History:   Procedure Laterality Date   . ABDOMINAL SURGERY     . CHOLECYSTECTOMY     . PACEMAKER Left 2017      Family History:     Family History   Problem Relation Age of Onset   . Hypertension Father      Social History:     Social History     Tobacco Use   . Smoking status: Never Smoker   . Smokeless tobacco: Never Used   Substance Use Topics   . Alcohol use: No     Comment: former alcoholic   . Drug use: No     Allergies:   He has No Known Allergies.  Medications:   Home Meds:   Current Outpatient Medications   Medication Sig   . acetaminophen (TYLENOL) 325 MG tablet Take 2 tablets (650 mg total) by mouth every 4 (four) hours  as needed for Fever (Temperature greater than 38 C/100.4 F).   Marland Kitchen albuterol-ipratropium (DUO-NEB) 2.5-0.5(3) mg/3 mL nebulizer Take 3 mLs by nebulization 2 (two) times daily as needed (SOB)   . allopurinol (ZYLOPRIM) 100 MG tablet Take 1 tablet (100 mg total) by mouth daily   . aspirin 81 MG chewable tablet Chew 1 tablet (81 mg total) by mouth daily   . atorvastatin (LIPITOR) 20 MG tablet Take 1 tablet (20 mg total) by mouth daily   . cetirizine (ZYRTEC) 10 MG tablet Take 1 tablet (10 mg total) by mouth daily as needed for Rhinitis.   Marland Kitchen dextromethorphan-guaiFENesin (MUCINEX DM) 30-600 MG per 12 hr tablet Take 1 tablet by mouth 2 (two) times daily as needed (cough)   . furosemide (LASIX) 20 MG tablet Take 2 tablets (40 mg total) by mouth daily   . gabapentin (NEURONTIN) 100 MG capsule TAKE ONE CAPSULE BY MOUTH EVERY 8 HOURS   . hydrALAZINE (APRESOLINE) 50 MG tablet Take 1 tablet (50 mg  total) by mouth every morning   . insulin glargine (LANTUS SOLOSTAR) 100 UNIT/ML injection pen Inject 10 Units into the skin nightly   . linaGLIPtin 5 MG Tab Take 1 tablet (5 mg total) by mouth daily   . metoprolol succinate XL (TOPROL-XL) 25 MG 24 hr tablet Take 1 tablet (25 mg total) by mouth daily   . pantoprazole (PROTONIX) 40 MG tablet TAKE ONE TABLET BY MOUTH EVERY DAY   . Probiotic Product (RISAQUAD PO) Take 1 capsule by mouth daily.   Marland Kitchen senna-docusate (PERICOLACE) 8.6-50 MG per tablet Take 2 tablets by mouth 2 (two) times daily   . sodium bicarbonate 650 MG tablet Take 2 tablets (1,300 mg total) by mouth 2 (two) times daily   . valsartan (DIOVAN) 80 MG tablet Take 1 tablet (80 mg total) by mouth daily.    (Not in a hospital admission)     Scheduled Meds:     heparin (porcine), 4,000 Units, Intravenous, Once  sodium chloride, 1,000 mL, Intravenous, Once  sodium chloride, 1,000 mL, Intravenous, Once      Continuous Infusions:  . heparin infusion 25,000 units/500 mL (Cardiac/Low Intensity)         PRN Meds:      Review of Systems:   All other systems were reviewed and negative except as mentioned above.    Physical Exam:   Vital Signs: BP 138/63   Pulse 80   Temp 99.1 F (37.3 C) (Oral)   Resp 16   Ht 1.575 m (5\' 2" )   Wt 72.1 kg (159 lb)   SpO2 97%   BMI 29.08 kg/m    No intake or output data in the 24 hours ending 01/07/18 2014  General Appearance:  Frail, elderly man, non-English speaking, no apparent distress.    HEENT: Sclera anicteric, conjunctiva without pallor, moist mucous membranes, normal dentition. No arcus.   Neck: Supple without jugular venous distention. Thyroid nonpalpable. Normal carotid upstroke without bruits.   Chest: Crackles to mid lung fields b/l  Cardiovascular: RRR; normal S1/S2; +S3; soft II/VI holosystolic murmur in LLSB, JVP 10 cm H20  Abdomen: Soft, nontender, nondistended, with normoactive bowel sounds. No organomegaly.  No pulsatile masses, or bruits.   Extremities: warm   Skin: Normal coloration and turgor, no rash, xanthoma or xanthelasma.   Musculoskeletal: no joint tenderness, deformity or swelling  Neuro: Alert and oriented x3. Grossly intact. Strength is symmetrical. Normal mood and affect.     Labs Reviewed:  Recent Labs     01/07/18  1829   WBC 9.28   Hgb 11.7*   Hematocrit 35.7*   Platelets 123*     Recent Labs     01/07/18  1922 01/07/18  1829   Troponin I  --  9.83*   i-STAT Troponin 5.70*  --    B-Natriuretic Peptide  --  4,529*    Recent Labs     01/07/18  1829   Sodium 135*   Potassium 4.9   CO2 15*   BUN 38.0*   Creatinine 3.0*   Glucose 236*    Estimated Creatinine Clearance: 14.5 mL/min (A) (based on SCr of 3 mg/dL (H)).   Recent Labs     01/07/18  1829   PT INR 1.2*        Rads:     Radiology Results (24 Hour)     Procedure Component Value Units Date/Time    Knee 4+ Views Right [[id number]] Collected:  01/07/18 1843    Order Status:  Completed Updated:  01/07/18 1848    Narrative:       CLINICAL HISTORY: Arthritis, swelling and pain.    FINDINGS: AP, oblique and lateral views of the right knee. Comparison is  made to 12/01/2017.    No acute fracture or dislocation. Tricompartment degenerative changes  most severe in the medial compartment where there is near complete loss  of joint space. Suprapatellar joint effusion is present. Extensive  arterial vascular calcifications.      Impression:          1. No acute fracture.  2. Severe tricompartment degenerative changes.  3. Joint effusion.    Loel Ro, MD   01/07/2018 6:44 PM    Knee 4+ Views Left [[id number]] Collected:  01/07/18 1841    Order Status:  Completed Updated:  01/07/18 1846    Narrative:       CLINICAL HISTORY: Pain, arthritis and swelling.    FINDINGS: AP, oblique and lateral views of the left knee. Comparison is  made to 06/03/2017.    No acute fracture or dislocation identified. Tricompartment degenerative  changes, greatest in the medial compartment where there is moderate  joint space narrowing,  progressed since prior. Suprapatellar joint  effusion is present. Extensive arterial vascular calcifications.      Impression:          1. No acute fracture identified.  2. Progressive tricompartment degenerative changes.  3. Joint effusion.    Loel Ro, MD   01/07/2018 6:42 PM    Chest AP Portable [[id number]] Collected:  01/07/18 1838    Order Status:  Completed Updated:  01/07/18 1845    Narrative:       CLINICAL HISTORY: Vomiting    FINDINGS: Portable semiupright AP view of the chest obtained.  Comparison: 12/03/2017    Left dual-lead pacemaker. No pneumothorax. No focal infiltrate. No overt  vascular congestion. No pleural effusion. Cardiac silhouette is mildly  enlarged but stable. Atherosclerotic calcification of the aorta. Mild  elevation the right hemidiaphragm is unchanged. Surgical clips in the  right upper quadrant abdomen.      Impression:        No acute disease. Stable cardiomegaly.    Loel Ro, MD   01/07/2018 6:41 PM        Assessment:     1) Stage C systolic HF (LVEF 56% with moderate MR) with acute on chronic systolic HF  2) Acute coronary syndrome - unable to  diagnose STE with underlying ventricular paced rhythm  3) Nausea/vomiting  4) Biotronic pacemaker 11/17  5) CKD IV  6) Essential hypertension  7) DM    Plan:     I discussed with pt and his daughter at length his clinical presentation. He is chest pain free and hemodynamically stable at this point. Diagnosis is ACS with worsening HF in the setting of end stage CKD, for which he has declined HD in the past. Unable to diagnose STE in the setting of V paced rhythm.    I explained to the patient and his family that given his severe underlying CKD, advanced age (>44), cardiomyopathy and DM, he will almost certainly require renal replacement therapy if he were to undergo coronary angiography. The patient and his family were explicit with me that they would not to initiate HD and he would not want invasive and resuscitative measures. Given  these considerations along with his advanced age and frailty, I have advised admission to [facility] for anticoagulation with UF heparin IV gtt, IV diuresis and HF GDMT. Would also consider consulting palliative care to establish goals of care.    Discussed with ED attending and Dr. Annie Main Day, interventionalist on call for [place].    Signed by: Waldemar Dickens, MD, [facility]   [facility] NP [place] [phone] or [phone] (M-F 8 am-5 pm)   [place] [phone] (M-F 8 am-5 pm)   [place] [phone]  (M-F 8 am-5 pm)  After Hours Consult Line: [phone]    cc:Danella Sensing, MD

## 2018-01-07 NOTE — H&P (Signed)
ADMISSION HISTORY AND PHYSICAL EXAM    Date Time: 01/07/18 9:55 PM  Patient Name: Marco Bruce  Attending Physician: Jerrye Beavers, MD  Primary Care Physician: Marco Sensing, MD    CC: NSTEMI, arthritis      Assessment:   [name] is a 84 yo male with history of HFrEF EF 40%, arthritis, CKD IV, presenting with severe arthritis pain found to have an STEMI with troponin to 10 without chest pain and without EKG changes although EKG is AV paced.  Evaluated by cardiology and deemed not a candidate for catheterization due to the CKD and and family's goals to decline CRRT.  Plan:   #NSTEMI   -given elevation of troponin to 10 likely secondary to plaque rupture compounded by demand ischemia due to pain  -EKG findings: AV paced rhythm   -Trend troponin q3h until peak  -ASA 81mg  daily  -Low intensity heparin drip  -Atorvastatin 40mg  daily  -Echocardiogram   -Metoprolol Succinate 12.5 mg daily reduced from 25 mg daily due to low blood pressures  -[facility] consulted by ED    #Chronic CM w/ reduced EF 40%, G1DD in 07/2017  -moderate MV regurg, mild AV regurg  -Appears to be volume depleted  -Continue IVF @ 75 cc/hr for 10 hrs  -Strict intake and output with daily standing weights.   -1.5 L fluid restriction. Goal net negative 1-2L  -Continue telemetry  -Keep electrolytes K>4 and Mg>2    #Arthritis  -moderate but progressive joint space narrowing bilaterally, suprapatellar joint effusions bilaterally.  -Concerning for gout  -Continue allopurinol  -Tylenol scheduled for pain, p.o. Dilaudid for severe pain  -colchicine 0.3 mg daily  -Follow-up uric acid  -assess for improvement  -May need to use intra-articular steroids    #Thrombocytopenia  -Unclear etiology, may need further workup  -may have some component of bone marrow failure  -Continue to monitor with CBC on heparin gtt    #T2DM Hyperglycemia  -Continue home Lantus  -LD SSI    #CKD IV  -oliguric  -Continue sodium bicarb  -Family has decided not to pursue  catheterization due to likely needing CRRT.  -Has refused hemodialysis.  -Renally dose medications    #AGMA  -AG = 17  -delta delta = 0.5 concerning for mixed anion gap and non anion gap metabolic acidosis  -Likely multifactorial from ketonuria, uremia    #HTN  -Continue metoprolol at lower dose  -Hold hydralazine, valsartan due to softer blood pressures      F   E replete to K>4, Mg>2  N Supervise For Meals Frequency: All meals  Diet Mechanical Soft Na restriction, if any: NO ADDED SALT; Fluid restriction: 1200 ML FLUID  G   DVT ppx SCDs    CODE: NO CPR  -  ALLOW NATURAL DEATH    Case discussed with attending, Dr. Kerby Less    Disposition: (Please see PAF column for Expected D/C Date)   Today's date: 01/07/2018  Admit Date: 01/07/2018  4:41 PM  Service status: Inpatient: risk of morbidity and mortality  Clinical Milestones: resolved NSTEMI  Anticipated discharge needs:     History of Presenting Illness:   Marco [facility] is a 84 y.o. male who presents to the [facility] with significant pain from arthritis found to have NSTEMI.    Coughing since January but worse yesterday improved with mucinex. Joint pains since last night and stoped moving. Nausea and vomiting started this morning. Usually ambulates with a walker. No falls.    Fluctuates between feeling  cold and hot. Denied any chest pain. No worsening shortness of breath with walking. Denies urinary symptoms.    Gout many years ago. L knee hurts more than R knee. States that diovan causes him pain.     Has been cutting back on drinking water due to prior admission for volume overload and heart failure exacerbation. Decreased appetite and endorses insomnia. 7 lb weight loss in the few months.    Daughter applies [place] "cupping" technique to alleviate remedies.    Past Medical History:     Past Medical History:   Diagnosis Date   . Acute diastolic heart failure 76/16/0737   . Acute on chronic diastolic congestive heart failure 08/13/2016   . Acute pulmonary edema 05/26/2015    . ARF (acute renal failure) 05/26/2015   . Chronic diastolic heart failure 10/62/6948   . Chronic gout    . Chronic pain syndrome 07/07/2015   . CKD (chronic kidney disease) stage 4, GFR 15-29 ml/min     2018 baseline creatinine in EPIC is 1.5-2.3.   . Congestive heart failure (CHF)     EF 40% 5/18 echo, on lasix   . Current chronic use of systemic steroids     For Polymyalgia rheumatica   . Essential hypertension    . Mixed hyperlipidemia    . NSTEMI (non-ST elevated myocardial infarction) 08/13/2016   . Osteoarthritis of both knees    . Peripheral edema 06/16/2015   . Pneumonia due to infectious organism, unspecified laterality, unspecified part of lung 06/25/2015   . Polymyalgia rheumatica     chronic steroids   . 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 Second degree heart block by electrocardiogram (ECG) 04/27/2015   . Type 2 diabetes mellitus with complication, with long-term current use of insulin     12/01/2016 Hemoglobin A1c 8.2%       Available old records reviewed, including: epic    Past Surgical History:     Past Surgical History:   Procedure Laterality Date   . ABDOMINAL SURGERY     . CHOLECYSTECTOMY     . PACEMAKER Left 2017       Family History:     Family History   Problem Relation Age of Onset   . Hypertension Father        Social History:     Social History     Tobacco Use   Smoking Status Never Smoker   Smokeless Tobacco Never Used     Social History     Substance and Sexual Activity   Alcohol Use No    Comment: former alcoholic     Social History     Substance and Sexual Activity   Drug Use No       Allergies:   No Known Allergies    Medications:     Home Medications     Med List Status:  In Progress Set By: Angelia Mould, RN at 01/07/2018  5:03 PM                acetaminophen (TYLENOL) 325 MG tablet     Take 2 tablets (650 mg total) by mouth every 4 (four) hours as needed for Fever (Temperature greater than 38 C/100.4 F).     albuterol-ipratropium (DUO-NEB) 2.5-0.5(3) mg/3 mL nebulizer      Take 3 mLs by nebulization 2 (two) times daily as needed (SOB)     allopurinol (ZYLOPRIM) 100 MG tablet     Take 1 tablet (100 mg total)  by mouth daily     aspirin 81 MG chewable tablet     Chew 1 tablet (81 mg total) by mouth daily     atorvastatin (LIPITOR) 20 MG tablet     Take 1 tablet (20 mg total) by mouth daily     cetirizine (ZYRTEC) 10 MG tablet     Take 1 tablet (10 mg total) by mouth daily as needed for Rhinitis.     dextromethorphan-guaiFENesin (MUCINEX DM) 30-600 MG per 12 hr tablet     Take 1 tablet by mouth 2 (two) times daily as needed (cough)     furosemide (LASIX) 20 MG tablet     Take 2 tablets (40 mg total) by mouth daily     gabapentin (NEURONTIN) 100 MG capsule     TAKE ONE CAPSULE BY MOUTH EVERY 8 HOURS     hydrALAZINE (APRESOLINE) 50 MG tablet     Take 1 tablet (50 mg total) by mouth every morning     insulin glargine (LANTUS SOLOSTAR) 100 UNIT/ML injection pen     Inject 10 Units into the skin nightly     linaGLIPtin 5 MG Tab     Take 1 tablet (5 mg total) by mouth daily     metoprolol succinate XL (TOPROL-XL) 25 MG 24 hr tablet     Take 1 tablet (25 mg total) by mouth daily     pantoprazole (PROTONIX) 40 MG tablet     Take 40 mg by mouth nightly     Probiotic Product (RISAQUAD PO)     Take 1 capsule by mouth daily.     senna-docusate (PERICOLACE) 8.6-50 MG per tablet     Take 2 tablets by mouth 2 (two) times daily     sodium bicarbonate 650 MG tablet     Take 2 tablets (1,300 mg total) by mouth 2 (two) times daily     valsartan (DIOVAN) 80 MG tablet     Take 1 tablet (80 mg total) by mouth daily.            Method by which medications were confirmed on admission: epic    Review of Systems:   All other systems were reviewed and are negative except: as stated in HPI.    Physical Exam:     Patient Vitals for the past 24 hrs:   BP Temp Temp src Pulse Resp SpO2 Height Weight   01/08/18 0010 115/64 - - 90 - - - -   01/07/18 2314 115/64 98.9 F (37.2 C) Axillary 80 18 94 % - -   01/07/18 2130  126/68 99.8 F (37.7 C) - 82 19 97 % - -   01/07/18 2100 155/71 - - 81 16 97 % - -   01/07/18 2000 150/64 - - 88 20 97 % - -   01/07/18 1930 138/63 - - 80 16 97 % - -   01/07/18 1900 128/65 - - 77 - 97 % - -   01/07/18 1834 102/65 - - 82 - 97 % - -   01/07/18 1538 129/61 99.1 F (37.3 C) Oral - - - 1.575 m (5\' 2" ) 72.1 kg (159 lb)   01/07/18 1535 - - - 78 18 97 % - -     Body mass index is 29.08 kg/m.  No intake or output data in the 24 hours ending 01/07/18 2155    Physical Exam  Constitutional:       General: He is not in acute distress.  Appearance: He is not toxic-appearing.   HENT:      Mouth/Throat:      Mouth: Mucous membranes are moist.   Cardiovascular:      Rate and Rhythm: Normal rate and regular rhythm.      Pulses: Normal pulses.      Heart sounds: Normal heart sounds. No murmur.      Comments: No JVD  Pulmonary:      Effort: Pulmonary effort is normal. No respiratory distress.   Abdominal:      General: There is no distension.      Palpations: Abdomen is soft.      Tenderness: There is no tenderness.   Musculoskeletal:         General: Swelling (bilateral knees without warmth) and tenderness present.      Right lower leg: No edema.      Left lower leg: No edema.   Skin:     General: Skin is warm and dry.   Neurological:      Mental Status: He is alert and oriented to person, place, and time.           Labs:     Results     Procedure Component Value Units Date/Time    Type and Screen [[id number]] Collected:  01/07/18 1829    Specimen:  Blood Updated:  01/07/18 1939     ABO Rh O POS     AB Screen Gel NEG    i-Stat Troponin [[id number]]  (Abnormal) Collected:  01/07/18 1922     Updated:  01/07/18 1935     i-STAT Troponin 5.70 ng/mL     i-Stat CG4 Venous CartrIDge [[id number]]  (Abnormal) Collected:  01/07/18 1920     Updated:  01/07/18 1924     i-STAT pH Venous 7.387     i-STAT pCO2 Venous 33.3     i-STAT pO2 Venous 35.0     i-STAT HCO3 Bicarbonate Venous 20.0 mEq/L      i-STAT Total CO2 Venous 21.0  mEq/L      i-STAT Base Excess Venous -4.0 mEq/L      i-STAT O2 Saturation Venous 67.0 %      i-STAT Lactic acid 1.4 mmol/L      i-STAT Patient Temperature 99.0 F     i-STAT FIO2 21     i-STAT Allen's Test NA     i-STAT Draw Site Venous    B-type Natriuretic Peptide [[id number]]  (Abnormal) Collected:  01/07/18 1829     Updated:  01/07/18 1921     B-Natriuretic Peptide 4,529 pg/mL     Troponin I [[id number]]  (Abnormal) Collected:  01/07/18 1829    Specimen:  Blood Updated:  01/07/18 1916     Troponin I 9.83 ng/mL     PT/APTT [[id number]]  (Abnormal) Collected:  01/07/18 1829     Updated:  01/07/18 1908     PT 15.0 sec      PT INR 1.2     PTT 46 sec     GFR [[id number]] Collected:  01/07/18 1829     Updated:  01/07/18 1902     EGFR 12.7    Basic Metabolic Panel [[id number]]  (Abnormal) Collected:  01/07/18 1829    Specimen:  Blood Updated:  01/07/18 1902     Glucose 236 mg/dL      BUN 38.0 mg/dL      Creatinine 3.0 mg/dL      Calcium 9.6 mg/dL      Sodium 135 mEq/L  Potassium 4.9 mEq/L      Chloride 103 mEq/L      CO2 15 mEq/L     Hepatic function panel (LFT) [[id number]]  (Abnormal) Collected:  01/07/18 1829    Specimen:  Blood Updated:  01/07/18 1902     Bilirubin, Total 1.0 mg/dL      Bilirubin, Direct 0.4 mg/dL      Bilirubin, Indirect 0.6 mg/dL      AST (SGOT) 41 U/L      ALT 6 U/L      Alkaline Phosphatase 45 U/L      Protein, Total 6.9 g/dL      Albumin 4.1 g/dL      Globulin 2.8 g/dL      Albumin/Globulin Ratio 1.5    Lipase [[id number]] Collected:  01/07/18 1829    Specimen:  Blood Updated:  01/07/18 1902     Lipase 17 U/L     CBC with differential [[id number]]  (Abnormal) Collected:  01/07/18 1829    Specimen:  Blood Updated:  01/07/18 1856     WBC 9.28 x10 3/uL      Hgb 11.7 g/dL      Hematocrit 35.7 %      Platelets 123 x10 3/uL      RBC 3.87 x10 6/uL      MCV 92.2 fL      MCH 30.2 pg      MCHC 32.8 g/dL      RDW 14 %      MPV 10.1 fL      Neutrophils 67.3 %      Lymphocytes Automated 22.1 %      Monocytes  9.5 %      Eosinophils Automated 0.3 %      Basophils Automated 0.3 %      Immature Granulocyte 0.5 %      Nucleated RBC 0.0 /100 WBC      Neutrophils Absolute 6.24 x10 3/uL      Abs Lymph Automated 2.05 x10 3/uL      Abs Mono Automated 0.88 x10 3/uL      Abs Eos Automated 0.03 x10 3/uL      Absolute Baso Automated 0.03 x10 3/uL      Absolute Immature Granulocyte 0.05 x10 3/uL      Absolute NRBC 0.00 x10 3/uL           Imaging personally reviewed, including:     Knee 4+ Views Left    Result Date: 01/07/2018   1. No acute fracture identified. 2. Progressive tricompartment degenerative changes. 3. Joint effusion. Loel Ro, MD 01/07/2018 6:42 PM    Knee 4+ Views Right    Result Date: 01/07/2018   1. No acute fracture. 2. Severe tricompartment degenerative changes. 3. Joint effusion. Loel Ro, MD 01/07/2018 6:44 PM    Chest Ap Portable    Result Date: 01/07/2018   No acute disease. Stable cardiomegaly. Loel Ro, MD 01/07/2018 6:41 PM      EKG: AV paced rhythm @ 79 bpm  CXR: bilateral blunting of costophrenic angles, no significant change from prior.    Safety Checklist  DVT prophylaxis:  CHEST guideline (See page e199S) Mechanical   Foley:  [place] Rn Foley protocol Not present   IVs:  Peripheral IV   PT/OT: Ordered   Daily CBC & or Chem ordered:  SHM/ABIM guidelines (see #5) Yes, due to clinical and lab instability   Reference for approximate charges of common labs: CBC auto diff - $  76  BMP - $99  Mg - $79    Signed by:  Ollen Gross, MD PGY-2  [facility]    cc:Marco Sensing, MD

## 2018-01-07 NOTE — ED Notes (Signed)
IV attempted x4. RN to [employer].

## 2018-01-07 NOTE — Progress Notes (Signed)
Readmission huddle held in ED regarding Medicare focused diagnosis patient.    Physician/Midlevel: Dr. Christell Constant  Time: 5:30pm    Neoma Laming, [place], ACM-SW  Emergency Department Social Worker  Case Management  [phone]  [email or web address] [email or web address]

## 2018-01-07 NOTE — EDIE (Signed)
COLLECTIVE?NOTIFICATION?01/07/2018 15:35?Marco Bruce?MRN: [id number]    Criteria Met      5 ED Visits in 12 Months    Security and Safety  No recent Security Events currently on file    ED Care Guidelines  There are currently no ED Care Guidelines for this patient. Please check your facility's medical records system.      Prescription Monitoring Program  000??- Narcotic Use Score  000??- Sedative Use Score  000??- Stimulant Use Score  000??- Overdose Risk Score  - All Scores range from 000-999 with 75% of the population scoring < 200 and on 1% scoring above 650  - The last digit of the narcotic, sedative, and stimulant score indicates the number of active prescriptions of that type  - Higher Use scores correlate with increased prescribers, pharmacies, mg equiv, and overlapping prescriptions  - Higher Overdose Risk Scores correlate with increased risk of unintentional overdose death   Concerning or unexpectedly high scores should prompt a review of the PMP record; this does not constitute checking PMP for prescribing purposes.      E.D. Visit Count (12 mo.)  Facility Visits   [facility] 1   Total 5   Note: Visits indicate total known visits.      Recent Emergency Department Visit Summary  Date Facility [facility] Type Diagnoses or Chief Complaint   Jan 07, 2018 [place]      triage      Dec 01, 2017 [place]      Abd pain      Shortness of Breath      Knee Pain      Jun 02, 2017 [place] [facility]      illness      Fever      Hip Pain      Joint Pain      Weakness      Hypo-osmolality and hyponatremia      Mixed hyperlipidemia      Essential (primary) hypertension      Long term (current) use of insulin      Chronic kidney disease, stage 4 (severe)      Apr 29, 2017 [place]      abdominal pain      Nausea with vomiting, unspecified      Generalized abdominal pain       Disorder of kidney and ureter, unspecified      Mar 18, 2017 [place]      headache, vomiting,weakness      Nausea      Generalized weakness      Cough      Hypo-osmolality and hyponatremia      Acute kidney failure, unspecified          Recent Inpatient Visit Summary  Date [place] Type Diagnoses or Chief Complaint   Dec 01, 2017 [place]      Unilateral primary osteoarthritis, right knee      Heart failure, unspecified      Shortness of breath      Acute kidney failure, unspecified      Chronic kidney disease, stage 4 (severe)      Type 2 diabetes mellitus with diabetic chronic kidney disease      Long term (current) use  of insulin      Jun 02, 2017 [place]. [facility]      Weakness      Hypo-osmolality and hyponatremia      Essential (primary) hypertension      Chronic kidney disease, stage 4 (severe)      Mixed hyperlipidemia      Type 2 diabetes mellitus with unspecified complications      Long term (current) use of insulin      Acute on chronic diastolic (congestive) heart failure      Chronic systolic (congestive) heart failure      Mar 18, 2017 [place]. [facility]      Hypo-osmolality and hyponatremia      Acute kidney failure, unspecified      Long term (current) use of insulin      Essential (primary) hypertension      Fever, unspecified      Type 2 diabetes mellitus with hyperglycemia      Polymyalgia rheumatica      Cough          Care Team  Provider Specialty Phone Fax Service Dates   Rhea Bleacher  Case Manager/Care Coordinator [phone]  Current    Ena Dawley  Case Manager/Care Coordinator [phone]  Current    [facility] [phone]  Current      Collect[employer] Portal  This patient has registered at the [facility] Emergency Department   For more information visit:  [email or web address]     PLEASE NOTE:    1.   Any care recommendations and other clinical information are provided as guidelines or for historical purposes only, and providers should exercise their own clinical judgment when providing care.    2.   You may only use this information for purposes of treatment, payment or health care operations activities, and subject to the limitations of applicable [employer] Polic[employer].    3.   You should consult directly with the organization that provided a care guideline or other clinical history with any questions about additional information or accuracy or completeness of information provided.    ? 2019 [employer] - [email or web address]

## 2018-01-07 NOTE — ED Provider Notes (Signed)
[facility] EMERGENCY DEPARTMENT H&P      Visit date: 01/07/2018      CLINICAL SUMMARY           Diagnosis:    .     Final diagnoses:   Vomiting, intractability of vomiting not specified, presence of nausea not specified, unspecified vomiting type   NSTEMI (non-ST elevated myocardial infarction)         MDM Notes:      Patient is an 84 year old male with known history of heart disease kidney disease and bad arthritis in today because of bilateral knee pain.  He states that his pain in his knees as his #1 complaint.  However the family is also concerned because over the last day he has had several episodes of vomiting.  On exam his knees show no  erythema warmth, but do have decreased range of motion some mild swelling.  Given that there is no warmth or erythema and it is bilateral my suspicion that this is septic is low.  My suspicion is that this is from his arthritis.  Suspicion is confirmed on x-ray which shows bad arthritis in bilateral knees.  For me the big concern was the patient's vomiting as he appeared somewhat peaked.  Initial EKG was performed and showed a paced rhythm with very mild concordance in aVL as well as possibly in 1.  Given the fact that the patient had no chest pain or shortness of breath at the time decision was made to get the troponin to work-up ACS further.  Troponin was somewhat delayed because the patient's axis was quite difficult and after multiple attempts at getting the IV axis we are able to get a troponin as well as basic lab work.  The BMP came back first showing a gap of roughly 16 and an elevated creatinine above his last creatinine a month ago.  His creatinine today was 3.  His sugar was also elevated so was a little bit concerned for possibility of DKA causing his symptoms but his sugar is not that elevated.  While waiting for the other lab work to come back decision was made to admit him to the hospital but while attempting this process his  troponin came back elevated greater than 9.  Repeat EKG was performed at this time and showed no obvious acute change from the one from earlier today.  However review of EKG from a month ago showed that today's EKGs looked slightly different than priors.  Given this dynamic change in the elevated troponin decision was made to call the on-call interventional cardiologist Dr.Tehrani.  Evaluate the EKGs with me and was also concerned but was concerned about the pt's kidney function and fact that in the past he had refused dialysis.  His concern was that the multiple dye loads would require dialysis particularly with the acidosis. Therefore together we decided not to activate the Cath Lab but have him come in and talk to the patient.  While awaiting for him to come and talk to the patient I discussed the findings with the patient's family and they concurred that the patient did not want dialysis and would never want to have dialysis.  Dr. Rae Halsted came and talked with the patient and their family as well and confirmed that they did not want dialysis ever and given this he was not a candidate subsequently for percutaneous intervention.  Therefore he will require medical management.  Dr. Rae Halsted is going to call [facility] which is the patient's  cardiology service and let them know about the patient and the fact that he will be admitted.  We will start the patient on a heparin drip.  Furthermore patient's family told Dr. Rae Halsted that he would not likely want any chest compression or intubation or invasive procedures.  Given this decision was made to admit to the hospitalist will place the patient in the [facility] you.  Again the patient has been started on heparin we will admit for further management.         Disposition:         Inpatient Admit      ED Disposition     ED Disposition Condition Date/Time Comment    Admit  Sat Jan 07, 2018  7:33 PM Admitting Physician: Kerby Less [facility] [[id number]]   Diagnosis: Vomiting [160109]    Estimated Length of Stay: > or = to 2 midnights   Tentative Discharge Plan?: Home or Self Care [1]   Patient Class: Inpatient [101]   Bed request comments: Tele with [facility]           CASE ACUITY SUMMARY        NSTEMI                CLINICAL INFORMATION        HPI:      Chief Complaint: Knee Pain; Hematemesis; and Cough  .    Careem Yasui is a 84 y.o. male with PMHx NSTEMI, CHF, peripheral edema, pulmonary edema, ARF, gout, CKD, HTN, HLD, DM2, afib, pacemaker, and cholecystectomy who presents with moderate persistent BLE knee pain and swelling a/w episode of emesis after eating last night, and reduced UOP. Family reports pt ate today with no emesis. Pt with blood sugar in 120s recently per son. Pt has been unable to walk due to the pain. Pt reports pain is worse with movement.    Denies blood thinners, smoking.    History obtained from: Patient, family          ROS:      Positive and negative ROS elements as per HPI.  All other systems reviewed and negative.      Physical Exam:      Pulse 78  BP 129/61  Resp 18  SpO2 97 %  Temp 99.1 F (37.3 C)    Physical Exam   Constitutional: Oriented to person, place, and time. Appears well-developed and well-nourished. No distress.   HENT:   Head: Normocephalic and atraumatic.   Eyes: EOM are normal. Right eye exhibits no discharge. Left eye exhibits no discharge.   Neck: Normal range of motion. Neck supple. No tracheal deviation present.   Cardiovascular: Normal rate, regular rhythm, normal heart sounds and intact distal pulses.  Exam reveals no gallop and no friction rub.    No murmur heard.  Pulmonary/Chest: Effort normal. No respiratory distress. No wheezes. no rales. no tenderness. crackles at bilateral bases, clear with coughing.  Abdominal: Soft. Bowel sounds are normal. no distension and no mass. There is no tenderness. There is no rebound and no guarding. old midline surgical scar on abdomen.  Musculoskeletal:   RUE: Normal range of motion. no edema, tenderness or  deformity.   LUE: Normal range of motion. no edema, tenderness or deformity.  RLE: no deformity. knee edema, no warmth, no erythema, decreased ROM secondary to pain.  LLE: no deformity. knee edema, no warmth, no erythema, decreased ROM secondary to pain.  Neurological: alert and oriented to person, place, and time.   No  focal deficit   Skin: Skin is warm and dry. No rash noted. Not diaphoretic. No erythema. No pallor.   Psychiatric: normal mood and affect. Behavior is normal.   Nursing note and vitals reviewed.              PAST HISTORY        Primary Care Provider: Danella Sensing, MD        PMH/PSH:    .     Past Medical History:   Diagnosis Date   . Acute diastolic heart failure 00/17/4944   . Acute on chronic diastolic congestive heart failure 08/13/2016   . Acute pulmonary edema 05/26/2015   . ARF (acute renal failure) 05/26/2015   . Chronic diastolic heart failure 96/75/9163   . Chronic gout    . Chronic pain syndrome 07/07/2015   . CKD (chronic kidney disease) stage 4, GFR 15-29 ml/min     2018 baseline creatinine in EPIC is 1.5-2.3.   . Congestive heart failure (CHF)     EF 40% 5/18 echo, on lasix   . Current chronic use of systemic steroids     For Polymyalgia rheumatica   . Essential hypertension    . Mixed hyperlipidemia    . NSTEMI (non-ST elevated myocardial infarction) 08/13/2016   . Osteoarthritis of both knees    . Peripheral edema 06/16/2015   . Pneumonia due to infectious organism, unspecified laterality, unspecified part of lung 06/25/2015   . Polymyalgia rheumatica     chronic steroids   . 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 Second degree heart block by electrocardiogram (ECG) 04/27/2015   . Type 2 diabetes mellitus with complication, with long-term current use of insulin     12/01/2016 Hemoglobin A1c 8.2%       He has a past surgical history that includes PACEMAKER (Left, 2017); Abdominal surgery; and Cholecystectomy.      Social/Family History:      He reports that he has never  smoked. He has never used smokeless tobacco. He reports that he does not drink alcohol or use drugs.    Family History   Problem Relation Age of Onset   . Hypertension Father          Listed Medications on Arrival:    .     Home Medications     Med List Status:  In Progress Set By: Angelia Mould, RN at 01/07/2018  5:03 PM                acetaminophen (TYLENOL) 325 MG tablet     Take 2 tablets (650 mg total) by mouth every 4 (four) hours as needed for Fever (Temperature greater than 38 C/100.4 F).     albuterol-ipratropium (DUO-NEB) 2.5-0.5(3) mg/3 mL nebulizer     Take 3 mLs by nebulization 2 (two) times daily as needed (SOB)     allopurinol (ZYLOPRIM) 100 MG tablet     Take 1 tablet (100 mg total) by mouth daily     aspirin 81 MG chewable tablet     Chew 1 tablet (81 mg total) by mouth daily     atorvastatin (LIPITOR) 20 MG tablet     Take 1 tablet (20 mg total) by mouth daily     cetirizine (ZYRTEC) 10 MG tablet     Take 1 tablet (10 mg total) by mouth daily as needed for Rhinitis.     dextromethorphan-guaiFENesin (MUCINEX DM) 30-600 MG per 12 hr tablet  Take 1 tablet by mouth 2 (two) times daily as needed (cough)     furosemide (LASIX) 20 MG tablet     Take 2 tablets (40 mg total) by mouth daily     gabapentin (NEURONTIN) 100 MG capsule     TAKE ONE CAPSULE BY MOUTH EVERY 8 HOURS     hydrALAZINE (APRESOLINE) 50 MG tablet     Take 1 tablet (50 mg total) by mouth every morning     insulin glargine (LANTUS SOLOSTAR) 100 UNIT/ML injection pen     Inject 10 Units into the skin nightly     linaGLIPtin 5 MG Tab     Take 1 tablet (5 mg total) by mouth daily     metoprolol succinate XL (TOPROL-XL) 25 MG 24 hr tablet     Take 1 tablet (25 mg total) by mouth daily     pantoprazole (PROTONIX) 40 MG tablet     TAKE ONE TABLET BY MOUTH EVERY DAY     Probiotic Product (RISAQUAD PO)     Take 1 capsule by mouth daily.     senna-docusate (PERICOLACE) 8.6-50 MG per tablet     Take 2 tablets by mouth 2 (two) times daily      sodium bicarbonate 650 MG tablet     Take 2 tablets (1,300 mg total) by mouth 2 (two) times daily     valsartan (DIOVAN) 80 MG tablet     Take 1 tablet (80 mg total) by mouth daily.         Allergies: He has No Known Allergies.            VISIT INFORMATION        Clinical Course in the ED:                 Medications Given in the ED:    .     ED Medication Orders (From admission, onward)    Start Ordered     Status Ordering Provider    01/07/18 1922 01/07/18 1921  heparin (porcine) injection 4,000 Units  Once     Route: Intravenous  Ordered Dose: 4,000 Units     Ordered Sabino Gasser C    01/07/18 1920 01/07/18 1921  heparin 25,000 units in dextrose 5% 500 mL infusion (premix)  Continuous     Route: Intravenous  Ordered Dose: 12 Units/kg/hr     Ordered Percilla Tweten C    01/07/18 1916 01/07/18 1915  sodium chloride 0.9 % bolus 1,000 mL  Once     Route: Intravenous  Ordered Dose: 1,000 mL     Last MAR action:  New Bag Molly Maduro    01/07/18 1915 01/07/18 1915  insulin regular (HumuLIN R,NovoLIN R) injection 5 Units  Once     Route: Intravenous  Ordered Dose: 5 Units     Last MAR action:  Given Sabino Gasser C    01/07/18 1906 01/07/18 1905  sodium chloride 0.9 % bolus 1,000 mL  Once     Route: Intravenous  Ordered Dose: 1,000 mL     Ordered Pippa Hanif C    01/07/18 1824 01/07/18 1823  ondansetron (ZOFRAN-ODT) disintegrating tablet 4 mg  Once     Route: Oral  Ordered Dose: 4 mg     Last MAR action:  Given Darrel Baroni C    01/07/18 1813 01/07/18 1812  acetaminophen (TYLENOL) tablet 1,000 mg  Once     Route: Oral  Ordered Dose: 1,000 mg  Last MAR action:  Given Ellamarie Naeve C            Procedures:      Procedures      Interpretations:      O2 sat-           saturation: 97 %; Oxygen use: room air; Interpretation: Normal    EKG -             interpreted by me: paced rhythm at 80. Left bundle morphology, no ectopy, possibly positive sgarbossa with very mild concordance in  AVL and I.  These leads looks slightly different from prior EKG.      EKG #2 -paced rhythm rate 78 left axis no obvious ectopy.Again, possible concordance in I and AVL.    Monitor -         interpreted by me: paced rhythm in 70s.       Critical Care Time(not including procedures): 60 minutes.   Due to the high risk of critical illness or multi-organ failure at initial presentation and/or during ED course.    System(s) at risk for compromise:  circulatory and respiratory  Critical Diagnosis:   1. Vomiting, intractability of vomiting not specified, presence of nausea not specified, unspecified vomiting type    2. NSTEMI (non-ST elevated myocardial infarction)         The patient was Hypotensive:   No     The patient was Hypoxic:   No     This does not including time spent performing other reported procedures or services.   Critical care time involved full attention to the patient's condition and included:   Review of nursing notes and/or old charts - Yes  Documentation time - Yes  Care, transfer of care, and discharge plans - Yes  Obtaining necessary history from family, EMS, nursing home staff and/or treating physicians - Yes  Review of medications, allergies, and vital signs - Yes   Consultant collaboration on findings and treatment options - Yes  Ordering, interpreting, and reviewing diagnostic studies/tab tests - Yes                   RESULTS        Lab Results:      Results     Procedure Component Value Units Date/Time    Type and Screen [[id number]] Collected:  01/07/18 1829    Specimen:  Blood Updated:  01/07/18 1939     ABO Rh O POS     AB Screen Gel NEG    i-Stat Troponin [[id number]]  (Abnormal) Collected:  01/07/18 1922     Updated:  01/07/18 1935     i-STAT Troponin 5.70 ng/mL     i-Stat CG4 Venous CartrIDge [[id number]]  (Abnormal) Collected:  01/07/18 1920     Updated:  01/07/18 1924     i-STAT pH Venous 7.387     i-STAT pCO2 Venous 33.3     i-STAT pO2 Venous 35.0     i-STAT HCO3 Bicarbonate Venous  20.0 mEq/L      i-STAT Total CO2 Venous 21.0 mEq/L      i-STAT Base Excess Venous -4.0 mEq/L      i-STAT O2 Saturation Venous 67.0 %      i-STAT Lactic acid 1.4 mmol/L      i-STAT Patient Temperature 99.0 F     i-STAT FIO2 21     i-STAT Allen's Test NA     i-STAT Draw Site Venous    B-type Natriuretic Peptide [  [id number]]  (Abnormal) Collected:  01/07/18 1829     Updated:  01/07/18 1921     B-Natriuretic Peptide 4,529 pg/mL     Troponin I [[id number]]  (Abnormal) Collected:  01/07/18 1829    Specimen:  Blood Updated:  01/07/18 1916     Troponin I 9.83 ng/mL     PT/APTT [[id number]]  (Abnormal) Collected:  01/07/18 1829     Updated:  01/07/18 1908     PT 15.0 sec      PT INR 1.2     PTT 46 sec     GFR [[id number]] Collected:  01/07/18 1829     Updated:  01/07/18 1902     EGFR 10.2    Basic Metabolic Panel [[id number]]  (Abnormal) Collected:  01/07/18 1829    Specimen:  Blood Updated:  01/07/18 1902     Glucose 236 mg/dL      BUN 38.0 mg/dL      Creatinine 3.0 mg/dL      Calcium 9.6 mg/dL      Sodium 135 mEq/L      Potassium 4.9 mEq/L      Chloride 103 mEq/L      CO2 15 mEq/L     Hepatic function panel (LFT) [[id number]]  (Abnormal) Collected:  01/07/18 1829    Specimen:  Blood Updated:  01/07/18 1902     Bilirubin, Total 1.0 mg/dL      Bilirubin, Direct 0.4 mg/dL      Bilirubin, Indirect 0.6 mg/dL      AST (SGOT) 41 U/L      ALT 6 U/L      Alkaline Phosphatase 45 U/L      Protein, Total 6.9 g/dL      Albumin 4.1 g/dL      Globulin 2.8 g/dL      Albumin/Globulin Ratio 1.5    Lipase [[id number]] Collected:  01/07/18 1829    Specimen:  Blood Updated:  01/07/18 1902     Lipase 17 U/L     CBC with differential [[id number]]  (Abnormal) Collected:  01/07/18 1829    Specimen:  Blood Updated:  01/07/18 1856     WBC 9.28 x10 3/uL      Hgb 11.7 g/dL      Hematocrit 35.7 %      Platelets 123 x10 3/uL      RBC 3.87 x10 6/uL      MCV 92.2 fL      MCH 30.2 pg      MCHC 32.8 g/dL      RDW 14 %      MPV 10.1 fL      Neutrophils 67.3 %       Lymphocytes Automated 22.1 %      Monocytes 9.5 %      Eosinophils Automated 0.3 %      Basophils Automated 0.3 %      Immature Granulocyte 0.5 %      Nucleated RBC 0.0 /100 WBC      Neutrophils Absolute 6.24 x10 3/uL      Abs Lymph Automated 2.05 x10 3/uL      Abs Mono Automated 0.88 x10 3/uL      Abs Eos Automated 0.03 x10 3/uL      Absolute Baso Automated 0.03 x10 3/uL      Absolute Immature Granulocyte 0.05 x10 3/uL      Absolute NRBC 0.00 x10 3/uL  Radiology Results:      Knee 4+ Views Right   Final Result       1. No acute fracture.   2. Severe tricompartment degenerative changes.   3. Joint effusion.      Loel Ro, MD    01/07/2018 6:44 PM      Knee 4+ Views Left   Final Result       1. No acute fracture identified.   2. Progressive tricompartment degenerative changes.   3. Joint effusion.      Loel Ro, MD    01/07/2018 6:42 PM      Chest AP Portable   Final Result    No acute disease. Stable cardiomegaly.      Loel Ro, MD    01/07/2018 6:41 PM                  Scribe Attestation:      I was acting as a Education administrator for [employer], MD on Denzil Magnuson    I am the first provider for this patient and I personally performed the services documented. Shellia Cleverly is scribing for me on [facility]. This note accurately reflects work and decisions made by me.  Molly Maduro, MD            Molly Maduro, MD  01/07/18 2014

## 2018-01-08 LAB — CBC AND DIFFERENTIAL
Absolute NRBC: 0 10*3/uL (ref 0.00–0.00)
Basophils Absolute Automated: 0.02 10*3/uL (ref 0.00–0.08)
Basophils Automated: 0.2 %
Eosinophils Absolute Automated: 0.02 10*3/uL (ref 0.00–0.44)
Eosinophils Automated: 0.2 %
Hematocrit: 32.3 % — ABNORMAL LOW (ref 37.6–49.6)
Hgb: 11 g/dL — ABNORMAL LOW (ref 12.5–17.1)
Immature Granulocytes Absolute: 0.03 10*3/uL (ref 0.00–0.07)
Immature Granulocytes: 0.4 %
Lymphocytes Absolute Automated: 1.46 10*3/uL (ref 0.42–3.22)
Lymphocytes Automated: 18 %
MCH: 31.2 pg (ref 25.1–33.5)
MCHC: 34.1 g/dL (ref 31.5–35.8)
MCV: 91.5 fL (ref 78.0–96.0)
MPV: 10.9 fL (ref 8.9–12.5)
Monocytes Absolute Automated: 0.82 10*3/uL (ref 0.21–0.85)
Monocytes: 10.1 %
Neutrophils Absolute: 5.76 10*3/uL (ref 1.10–6.33)
Neutrophils: 71.1 %
Nucleated RBC: 0 /100 WBC (ref 0.0–0.0)
Platelets: 112 10*3/uL — ABNORMAL LOW (ref 142–346)
RBC: 3.53 10*6/uL — ABNORMAL LOW (ref 4.20–5.90)
RDW: 14 % (ref 11–15)
WBC: 8.11 10*3/uL (ref 3.10–9.50)

## 2018-01-08 LAB — TROPONIN I
Troponin I: 9.91 ng/mL (ref 0.00–0.05)
Troponin I: 7.95 ng/mL (ref 0.00–0.05)

## 2018-01-08 LAB — ECG 12-LEAD
Atrial Rate: 80 {beats}/min
Atrial Rate: 83 {beats}/min
P Axis: 39 degrees
P-R Interval: 150 ms
Q-T Interval: 490 ms
Q-T Interval: 472 ms
QRS Duration: 190 ms
QRS Duration: 174 ms
QTC Calculation (Bezet): 565 ms
QTC Calculation (Bezet): 538 ms
R Axis: -79 degrees
R Axis: -73 degrees
T Axis: 121 degrees
T Axis: 111 degrees
Ventricular Rate: 80 {beats}/min
Ventricular Rate: 78 {beats}/min

## 2018-01-08 LAB — GLUCOSE WHOLE BLOOD - POCT
Whole Blood Glucose POCT: 144 mg/dL — ABNORMAL HIGH (ref 70–100)
Whole Blood Glucose POCT: 172 mg/dL — ABNORMAL HIGH (ref 70–100)
Whole Blood Glucose POCT: 201 mg/dL — ABNORMAL HIGH (ref 70–100)
Whole Blood Glucose POCT: 203 mg/dL — ABNORMAL HIGH (ref 70–100)

## 2018-01-08 LAB — URIC ACID: Uric acid: 9 mg/dL (ref 3.6–9.7)

## 2018-01-08 LAB — BASIC METABOLIC PANEL
BUN: 42 mg/dL — ABNORMAL HIGH (ref 9.0–28.0)
CO2: 17 mEq/L — ABNORMAL LOW (ref 22–29)
Calcium: 9.4 mg/dL (ref 7.9–10.2)
Chloride: 104 mEq/L (ref 100–111)
Creatinine: 3 mg/dL — ABNORMAL HIGH (ref 0.7–1.3)
Glucose: 211 mg/dL — ABNORMAL HIGH (ref 70–100)
Potassium: 4.5 mEq/L (ref 3.5–5.1)
Sodium: 135 mEq/L — ABNORMAL LOW (ref 136–145)

## 2018-01-08 LAB — SEDIMENTATION RATE: Sed Rate: 93 mm/Hr — ABNORMAL HIGH (ref 0–15)

## 2018-01-08 LAB — GFR: EGFR: 19.8

## 2018-01-08 LAB — APTT: PTT: 142 s (ref 23–37)

## 2018-01-08 LAB — C-REACTIVE PROTEIN: C-Reactive Protein: 26.4 mg/dL — ABNORMAL HIGH (ref 0.0–0.8)

## 2018-01-08 MED ORDER — ATORVASTATIN CALCIUM 40 MG PO TABS
40.00 mg | ORAL_TABLET | Freq: Every evening | ORAL | Status: DC
Start: 2018-01-08 — End: 2018-01-10
  Administered 2018-01-08 – 2018-01-09 (×2): 40 mg via ORAL
  Filled 2018-01-08 (×2): qty 1

## 2018-01-08 MED ORDER — HYDROMORPHONE HCL 2 MG PO TABS
1.00 mg | ORAL_TABLET | ORAL | Status: DC | PRN
Start: 2018-01-08 — End: 2018-01-10
  Administered 2018-01-08 – 2018-01-09 (×4): 1 mg via ORAL
  Filled 2018-01-08 (×5): qty 1

## 2018-01-08 MED ORDER — PLASMA-LYTE A IV INFUSION
INTRAVENOUS | Status: AC
Start: 2018-01-08 — End: 2018-01-08

## 2018-01-08 MED ORDER — FUROSEMIDE 40 MG PO TABS
40.00 mg | ORAL_TABLET | Freq: Every day | ORAL | Status: DC
Start: 2018-01-08 — End: 2018-01-10
  Administered 2018-01-08 – 2018-01-10 (×3): 40 mg via ORAL
  Filled 2018-01-08 (×3): qty 1

## 2018-01-08 MED ORDER — COLCHICINE 0.6 MG PO TABS
0.30 mg | ORAL_TABLET | Freq: Every day | ORAL | Status: DC
Start: 2018-01-08 — End: 2018-01-08
  Administered 2018-01-08: 09:00:00 0.3 mg via ORAL
  Filled 2018-01-08: qty 1

## 2018-01-08 NOTE — Plan of Care (Addendum)
Shift Note:  0800: Bedside report received from outgoing RN.  Introduced to pt and white board updated.   IV in place with Plasmalyte A infusing; heparin drip restarted at 9 units/kg/hr per protocol. Tele in place with NSR with bundle branch block and occasional v pacing. Fall level high with bed alarm active.  VSS on room air with soft BP.  Patient drowsy and frequently falls asleep.  Lying quietly in bed with no grimacing or thrashing patient reports pain as 10/10.  Improved to 5/10 following gabapentin.       Heparin drip discontinued and removed.      Pain managed throughout shift.  Patient became drowsy following administration of dilaudid for severe pain exacerbated with movement in the bed.  Wakes to light touch and responds appropriately but falls asleep quickly.  Breathing even, non labored.    Patient declined OOB activity.  Discussed the importance of early mobility but patient declines offers of assistance.  Reports anxiety of increased pain with ambulation. Per wife patient ambulates at home with walker but activity has been limited since pain in knee started.        Discharge Plan: TBD    Social/Family Visits: Patient's adult grandson is at bedside.  Patient refuses hospital interpreter and prefers to use family member to interpret.      POC update:   ECHO    Physical therapy.    Home Lasix restarted  Scheduled tylenol and gabapentin with lidocaine patches for pain management.  Dilaudid for break through pain      Vitals:    01/08/18 0400 01/08/18 0530 01/08/18 0800 01/08/18 0900   BP: 108/64  97/54 103/59   Pulse: 79 80 74 78   Resp: 16  14    Temp: 98.6 F (37 C)  98 F (36.7 C)    TempSrc: Oral  Oral    SpO2: 96%  93% 98%   Weight:       Height:           Patient Lines/Drains/Airways Status      Active Lines, Drains and Airways       Name:   Placement date:   Placement time:   Site:   Days:    Peripheral IV 01/07/18 Left Hand   01/07/18    1832    Hand   less than 1    Peripheral IV 01/07/18  Right Antecubital   01/07/18    2044    Antecubital   less than 1

## 2018-01-08 NOTE — Progress Notes (Signed)
MEDICINE PROGRESS NOTE    Date Time: 01/08/18 12:24 PM  Patient Name: Marco Bruce  Attending Physician: Archie Patten, MD    Assessment:     Active Hospital Problems    Diagnosis   . Vomiting   . Acute on chronic systolic heart failure   . NSTEMI (non-ST elevated myocardial infarction)   . CKD (chronic kidney disease), stage IV     Marco Bruce is a 84 yo male with history of HFrEF EF 40%, gouty arthritis, CKD IV, DMII, HTN presenting with severe arthritis pain found to have an STEMI with troponin to 9.9 without angina or anginal equivalents, and without EKG changes although EKG is AV paced.  Evaluated by cardiology and deemed not a candidate for catheterization due to the CKD and and family's goals to decline CRRT.    Plan:   #NSTEMI - likely demand ischemia, troponins peaked at 9.9. EKG - AV paced rhythm. Was briefly on heparin drip until troponins downtended to 7.95 on 10/6 AM.   -- GDMT: Continue ASA, Lipitor 40, Metoprolol 12.5 BID (decreased from home dose for high BP). Hold Valsartan given low BP.  -- D/C Heparin gtt per cardiology  -- TTE looking for wall motion abnormalities.  -- [facility] consulted    #Chronic CM w/ reduced EF 40%, G1DD  (Echo in 07/2017) - Does not appear to be acutely volume overloaded despite BNP > 4000. CXR no acute changes. No HF symptoms.  -- s/p IVF 1750L since admission  -- Will encourage PO for now  -- Continue home Lasix 40mg  qday   -- GDMT: Continue Metoprolol, hold Valsartan and Hydralazine (can resume if BP improves)  -- Strict intake and output with daily standing weights.   --1.5 L fluid restriction. Goal net negative 1-2L  -- Continue telemetry  -- Keep electrolytes K>4 and Mg>2    #Arthritis, likely gouty vs. OA; improved on medical therapy.  -- B Knee X-ray: DJD with suprapatellar joint effusions; Uric Acid above goal at 9; CRP and ESR elevated.  -- Discontinue Colchicine 0.3mg /d (as it is relatively contraindicated in renal failure), APAP q6 scheduled, Lidocaine patches,  Allopurinol 100mg /d (home dose) and Neurontin 100mg  TID (home dose)    #CKD IV  -Continue sodium bicarb 1300 mg BID  -Family has decided not to pursue LHC due to likely needing CRRT and refusing HD  -Renally dose medications  -- Monitor UOP closely. Bladder scan if needed.    #Thrombocytopenia  -Unclear etiology, may need further workup  -may have some component of bone marrow failure  -Continue to monitor with CBC     #T2DM Hyperglycemia  -Continue home Lantus 10u QHS  -LD SSI    #HTN  -Continue metoprolol at lower dose  -Hold hydralazine, valsartan due to low blood pressures; Re-introduce when possible.    F   E replete to K>4, Mg>2  N Supervise For Meals Frequency: All meals  Diet Mechanical Soft Na restriction, if any: NO ADDED SALT; Fluid restriction: 1200 ML FLUID  G   DVT ppx SCDs    CODE: NO CPR  -  ALLOW NATURAL DEATH    Safety Checklist:     DVT prophylaxis:  CHEST guideline (See page [id number]) Chemical   Foley: Not present   IVs:  Peripheral IV   PT/OT: Ordered   Daily CBC & or Chem ordered:  SHM/ABIM guidelines (see #5) Yes, due to clinical and lab instability     Patient Lines/Drains/Airways Status    Active PICC  Line / CVC Line / PIV Line / Drain / Airway / Intraosseous Line / Epidural Line / ART Line / Line / Wound / Pressure Ulcer / NG/OG Tube     Name:   Placement date:   Placement time:   Site:   Days:    Peripheral IV 01/07/18 Left Hand   01/07/18    1832    Hand   less than 1    Peripheral IV 01/07/18 Right Antecubital   01/07/18    2044    Antecubital   less than 1                Disposition:     Anticipated discharge needs: TBD    Subjective     CC: bilateral knee pain, elevated troponin    Interval History/24 hour events: admitted with heparin gtt    HPI/Subjective:   -- Feels better with improved knee pain  -- has not gotten out of bed yet  -- ROS as below    Review of Systems:   Review of Systems - Review of Systems   Constitutional: Negative for fever.   Respiratory: Negative for cough.     Cardiovascular: Negative for chest pain, palpitations, orthopnea, leg swelling and PND.   Gastrointestinal: Negative for diarrhea, nausea and vomiting.   Musculoskeletal: Positive for joint pain (bilateral knees).     Physical Exam:       VITAL SIGNS PHYSICAL EXAM   Temp:  [98 F (36.7 C)-99.8 F (37.7 C)] 98.9 F (37.2 C)  Heart Rate:  [73-90] 73  Resp Rate:  [14-20] 16  BP: (97-155)/(54-71) 111/57  Telemetry: paced rhythm       Intake/Output Summary (Last 24 hours) at 01/08/2018 1224  Last data filed at 01/08/2018 0700  Gross per 24 hour   Intake 515.12 ml   Output 320 ml   Net 195.12 ml    Physical Exam  Gen: no acute distress, lying comfortably in bed  HEENT: EOMI, no icterus  Cardiac: RRR no m/r/g   Lungs: CTA bilaterally  Abdomen: obese, soft, non-tender, non-distended  Ext: no edema  Neuro: distal n/v intact, alert  MSK: bilateral knees wrapped in ace wrap with lidocaine patches, small effusion palpable above patella, mild pain with ROM  Skin: clean dry and intact       Meds:     Medications were reviewed.    Labs:       Labs (last 72 hours):    Recent Labs   Lab 01/08/18  0115 01/07/18  1829   WBC 8.11 9.28   Hgb 11.0* 11.7*   Hematocrit 32.3* 35.7*   Platelets 112* 123*       Recent Labs   Lab 01/08/18  0517 01/07/18  1829   PT  --  15.0   PT INR  --  1.2*   PTT 142* 46*    Recent Labs   Lab 01/08/18  0115 01/07/18  1829   Sodium 135* 135*   Potassium 4.5 4.9   Chloride 104 103   CO2 17* 15*   BUN 42.0* 38.0*   Creatinine 3.0* 3.0*   Calcium 9.4 9.6   Albumin  --  4.1   Protein, Total  --  6.9   Bilirubin, Total  --  1.0   Alkaline Phosphatase  --  45   ALT  --  6   AST (SGOT)  --  41*   Glucose 211* 236*  Microbiology, reviewed and are significant for:  Microbiology Results     None        Imaging personally reviewed, including:   Knee 4+ Views Left    Result Date: 01/07/2018   1. No acute fracture identified. 2. Progressive tricompartment degenerative changes. 3. Joint effusion. Loel Ro, MD 01/07/2018 6:42 PM    Knee 4+ Views Right    Result Date: 01/07/2018   1. No acute fracture. 2. Severe tricompartment degenerative changes. 3. Joint effusion. Loel Ro, MD 01/07/2018 6:44 PM    Chest Ap Portable    Result Date: 01/07/2018   No acute disease. Stable cardiomegaly. Loel Ro, MD 01/07/2018 6:41 PM      Signed by: Darcella Gasman, DO

## 2018-01-08 NOTE — UM Notes (Addendum)
Myocardial Infarction - Clinical Indications for Admission to Inpatient Care    01/07/2018 19:33 Inpatient  Care day#1  PCCU    HPI: 84y.o. M arrived [facility] ED c/o vomiting/nausea, weakness and dehydration. Crackles in bilateral bases   found to be having a STEMI, trop - 7.68    Pmh: diastolic heart failure, acute pulmonary edema, ARF, gout, chronic pain syndrome, CKD, CHF, HTN, NSTEMI, PNA, DMT2    Vs:  129/61, p 78, t 99.1, rr 18, sats 97%ra    Gluc - 236  Trop - 5.70>9.95>7.05  BUN - 38.8, Cr - 3.0  C-reactive protein - 26.4  Sed rate - 93  BNP - 2529    TX ED: NS bolus x1L, zofran iv x1, insulin 5 units x1    Cardiology conuslt - not a candidate for catheterization due to the CKD      Vs q4h, tele, pulse ox, ivfs, heparin gtt, ASA po daily plasma-Lyte infusion  Dilaudid po q3h prn  Statin  ECHO  Strict I&Os  Keep electrolytes K>4 and Mg>2    AGMA  -AG = 17  -delta delta = 0.5 concerning for mixed anion gap and non anion gap metabolic acidosis  -Likely multifactorial from ketonuria, uremia    CODE: NO CPR  -  ALLOW NATURAL DEATH    16-Jan-2018  Care day#2    resting in bed, Drowsy, frequently falls asleep. Reports pain 10/10 improved t 5/10 after gabapentin given    Plasma-lyte infusing  TELE - NSR with BBB and occasional V pacing  Heparin gtt - d/c'd  ECHO - pending  Family has refused dialysis if needed  Continue sodium bicarb  Daily BMP  fluid restriction of 1.5L  POCT gluc  q ac, ssi    DIspo - tbd    Completed by;   Bonnita Levan, RN CM  Utilization [place]  [phone]

## 2018-01-08 NOTE — Plan of Care (Signed)
Problem: Moderate/High Fall Risk Score >5  Goal: Patient will remain free of falls  Outcome: Progressing  Flowsheets (Taken 01/07/2018 2300)  High (Greater than 13): HIGH-Consider use of low bed;HIGH-Initiate use of floor mats as appropriate;HIGH-Pharmacy to initiate evaluation and intervention per protocol;HIGH-Apply yellow "Fall Risk" arm band;HIGH-Activate bed/chair exit alarm where available     Problem: Safety  Goal: Patient will be free from injury during hospitalization  Outcome: Progressing  Flowsheets (Taken 01/08/2018 0455)  Patient will be free from injury during hospitalization : Assess patient's risk for falls and implement fall prevention plan of care per policy; Provide and maintain safe environment; Use appropriate transfer methods; Ensure appropriate safety devices are available at the bedside; Hourly rounding; Include patient/ family/ care giver in decisions related to safety; Assess for patients risk for elopement and implement [place] per policy; Provide alternative method of communication if needed (communication boards, writing)  Goal: Patient will be free from infection during hospitalization  Outcome: Progressing  Flowsheets (Taken 01/08/2018 0455)  Free from Infection during hospitalization: Assess and monitor for signs and symptoms of infection; Monitor lab/diagnostic results; Monitor all insertion sites (i.e. indwelling lines, tubes, urinary catheters, and drains); Encourage patient and family to use good hand hygiene technique     Problem: Pain  Goal: Pain at adequate level as identified by patient  Outcome: Progressing  Flowsheets (Taken 01/08/2018 0455)  Pain at adequate level as identified by patient: Identify patient comfort function goal; Assess for risk of opioid induced respiratory depression, including snoring/sleep apnea. Alert healthcare team of risk factors identified.; Assess pain on admission, during daily assessment and/or before any "as needed" intervention(s);  Reassess pain within 30-60 minutes of any procedure/intervention, per Pain Assessment, Intervention, Reassessment (AIR) Cycle; Evaluate if patient comfort function goal is met; Offer non-pharmacological pain management interventions     Problem: Psychosocial and Spiritual Needs  Goal: Demonstrates ability to cope with hospitalization/illness  Outcome: Progressing  Flowsheets (Taken 01/08/2018 0455)  Demonstrates ability to cope with hospitalizations/illness: Encourage verbalization of feelings/concerns/expectations; Assist patient to identify own strengths and abilities; Encourage patient to set small goals for self; Encourage participation in diversional activity; Provide quiet environment; Include patient/ patient care companion in decisions; Reinforce positive adaptation of new coping behaviors; Communicate referral to spiritual care as appropriate      Received patient from ER at 2130 on Heparin gtt, patient AV paced via PPM, then V paced, then NSR. MD Aware, PPM card copied and placed in chart. A&Ox2, confused at times, denies CP/SOB, troponin's trending, afebrile, c/o bilateral knee pain with lidocaine patch relief. Denies all PM meds, MD aware. [place] speaking with family at bedside and refusal to use  approved interpreter. DNR/DNI confirmed with Patient's POA, RA, diet order placed, UA sample sent, IVF started per order. Will continue to monitor patient.

## 2018-01-08 NOTE — Progress Notes (Signed)
Initial Discharge Planning Assessment     S-   Patient: Marco Bruce,Marco Bruce , 84 y.o., male  Hospital Day:  1  Admitting diagnosis: Acute on chronic systolic heart failure [F42.39]  Pacemaker [Z95.0]  CKD (chronic kidney disease), stage IV [N18.4]  NSTEMI (non-ST elevated myocardial infarction) [I21.4]  Vomiting, intractability of vomiting not specified, presence of nausea not specified, unspecified vomiting type [R11.10]     B-  Living arrangements:  w daughter son in law and grandson   DME at home:             cane  Home Care Services: [facility] Medicaid Waiver Aides  Outpatient Services:   none  Transportation:            family or friends of family/[facility] aide     A-Pt. Readmitted, no contact with skilled home health upon recent [place]. Encourage ongoing communication with skilled services for home.     R- Have PT/OT evaluations while inpatient for discharge recommendations.      Discharge barriers identified:  None at this time     01/08/18 1554   Patient Type   Within 30 Days of Previous Admission? Yes   Healthcare Decisions   Interviewed: Family   Orientation/Decision Making Abilities of Patient Other (coment)  (defers to grandson present at bedside)   Advance Directive Patient does not have advance directive   Healthcare Agent Appointed Yes   (RETIRED) Healthcare Agent's Name Pt. daughter as listed, grandson as Optometrist   Prior to admission   Prior level of function Ambulates with assistive device;Needs assistance with ADLs   Type of Residence Private residence   Home Layout One level   Have running water, electricity, heat, etc? Yes   Living Arrangements Children;Family members   How do you get to your MD appointments? family   How do you get your groceries? family   Who fixes your meals? family   Who does your laundry? family   Who picks up your prescriptions? family   Dressing Independent   Grooming Needs assistance   Feeding Independent   Bathing Independent   Toileting Independent   DME Currently at [place]  [facility]   Name of Prior [place] aide   Name of Agency [facility]   Adult Scientist, forensic (APS) involved? No   Discharge Planning   Support Systems Children;Family members   Patient expects to be discharged to: home w home health aid   Anticipated [place] plan discussed with: Same as interviewed   Potential barriers to discharge: Cultural / spiritual;Pt/Family disagreement with discharge plan   Mode of transportation: Private car (family member)   Does the patient have perscription coverage? Yes   Consults/Providers   PT Evaluation Needed 1   OT Evalulation Needed 1   SLP Evaluation Needed 1   Outcome Palliative Care Screen   (unknown)   Correct PCP listed in Epic? Yes   PCP   PCP on file was verified as the current PCP? Yes   Important Message from [facility] Notice   Patient received 1st IMM Letter? Yes   Date of most recent IMM given: 01/07/18   Domingo Madeira, MSW, [place] Work Case Manager [facility]  Case Management Department  [phone]

## 2018-01-08 NOTE — Progress Notes (Addendum)
[facility]      Date Time: 01/08/18 9:00 AM  Patient Name: [facility]  Medical Record #:  [id number]  Account#:  [id number]  Admission Date:  01/07/2018         Patient Active Problem List   Diagnosis   . CKD (chronic kidney disease), stage IV   . Renovascular hypertension   . NSTEMI (non-ST elevated myocardial infarction)   . Hyponatremia   . Essential hypertension   . Acute on chronic diastolic congestive heart failure   . Hypokalemia   . Mixed hyperlipidemia   . Type 2 diabetes mellitus with complication, with long-term current use of insulin   . Acute gout of right knee   . Acute renal failure superimposed on stage 3 chronic kidney disease   . Acute on chronic systolic heart failure   . Acute bronchitis due to other specified organisms   . Pacemaker   . Bronchitis   . Diaphragm paralysis   . Vomiting       Subjective:   Denies chest pain, SOB or palpitations.  Khmer gentleman with his grandson translating at bedside  Has productive cough with while sputum  Vomiting yesterday; none today  Assessment:    Admitted with weakness, N/V.    Elevated sed rate and CRP   Elevated BNP   Troponin 10 in setting of CKD with Cr 3--likely demand process with CHF with underlying CKD. Not a candidate for cath    Stage C systolic HF (LVEF 82% with moderate MR)   Biotronic pacemaker 11/17   Advanced CKD IV/V --declined HD last admission   HTN   DM      Recommendations:    Troponins downtrending. Can turn off heparin gtt today   Asa, Toprol, no ace/arb given CKD. His BPS are low as well--hold hydralazine   Nephrology consult for recs. He is on Lasix 40mg  po daily at home--will resume this dose here   Goals of care and palliative care discussions    Medications:      Scheduled Meds:    acetaminophen, 650 mg, Oral, 4 times per day  allopurinol, 100 mg, Oral, Daily  aspirin, 81 mg, Oral, Daily  atorvastatin, 40 mg, Oral, QHS  cetirizine, 10 mg, Oral, Daily  colchicine, 0.3 mg, Oral,  Daily  gabapentin, 100 mg, Oral, Q8H  insulin glargine, 10 Units, Subcutaneous, QHS  insulin lispro, 1-3 Units, Subcutaneous, QHS  insulin lispro, 1-5 Units, Subcutaneous, TID AC  lactobacillus/streptococcus, 1 capsule, Oral, Daily  lidocaine, 2 patch, Transdermal, Q24H  metoprolol succinate XL, 12.5 mg, Oral, Daily  pantoprazole, 40 mg, Oral, QAM AC  senna-docusate, 2 tablet, Oral, BID  sodium bicarbonate, 1,300 mg, Oral, Q12H SCH  sodium chloride, 1,000 mL, Intravenous, Once        Continuous Infusions:  . heparin infusion 25,000 units/500 mL (Cardiac/Low Intensity) 9 Units/kg/hr (01/08/18 0758)   . Plasma-Lyte A 75 mL/hr at 01/08/18 0700            Physical Exam:       VITAL SIGNS PHYSICAL EXAM   Vitals:    01/08/18 0400 01/08/18 0530 01/08/18 0800 01/08/18 0900   BP: 108/64  97/54 103/59   Pulse: 79 80 74 78   Resp: 16  14    Temp: 98.6 F (37 C)  98 F (36.7 C)    TempSrc: Oral  Oral    SpO2: 96%  93% 98%   Weight:  Height:           Telemetry: Reviewed VP      Intake/Output Summary (Last 24 hours) at 01/08/2018 0900  Last data filed at 01/08/2018 0700  Gross per 24 hour   Intake 515.12 ml   Output 320 ml   Net 195.12 ml    Physical Exam  General: awake, alert, breathing comfortably, no acute distress  Head: normocephalic  Eyes: EOM's intact  Cardiovascular: regular rate and rhythm, normal S1, S2, no S3, no S4, no murmurs, rubs or gallops  Neck: no carotid bruits or JVD  Lungs: clear to auscultation bilateraly, without wheezing, rhonchi, or rales  Abdomen: soft, non-tender, non-distended; no palpable masses,  normoactive bowel sounds  Extremities: no edema  Pulse: equal pulses, 4/4 symmetric  Neurological: Alert and oriented X3, mood and affect normal  Musculoskeletal: normal strength and tone         Labs:     Recent Labs   Lab 01/08/18  0430 01/08/18  0115 01/07/18  2236   Troponin I 7.95* 9.91* 9.95*             Recent Labs   Lab 01/07/18  1829   Bilirubin, Total 1.0   Bilirubin, Direct 0.4   Protein,  Total 6.9   Albumin 4.1   ALT 6   AST (SGOT) 41*         Recent Labs   Lab 01/08/18  0517 01/07/18  1829   PT  --  15.0   PT INR  --  1.2*   PTT 142* 46*     Recent Labs   Lab 01/08/18  0115 01/07/18  1829   WBC 8.11 9.28   Hgb 11.0* 11.7*   Hematocrit 32.3* 35.7*   Platelets 112* 123*     Recent Labs   Lab 01/08/18  0115 01/07/18  1829   Sodium 135* 135*   Potassium 4.5 4.9   Chloride 104 103   CO2 17* 15*   BUN 42.0* 38.0*   Creatinine 3.0* 3.0*   EGFR 19.8 19.8   Glucose 211* 236*   Calcium 9.4 9.6           Invalid input(s): FREET4    .  Lab Results   Component Value Date    BNP 4,529 (H) 01/07/2018        Weight Monitoring 12/05/2017 12/06/2017 12/07/2017 12/08/2017 12/30/2017 01/07/2018 01/07/2018   Height - - - - - 157.5 cm 157.5 cm   Height Method - - - - - Stated Stated   Weight 90.402 kg 88.678 kg 80.559 kg 77.384 kg 72.122 kg 72.122 kg 72 kg   Weight Method Bed Scale Bed Scale Standing Scale Standing Scale - - Bed Scale   BMI (calculated) - - - - - 29.1 kg/m2 29.1 kg/m2       Imaging:       ____________________________________________    Signed by: Jeri Modena, [place]  NP [place] (8am-5pm)  MD Spectralink [phone] or [phone] (8am-5pm)  Arrhythmia Spectralink [phone] (8am-4:30pm)  After hours, non urgent consult line [phone]  After Hours, urgent consults [phone]    I saw and examined the patient. RRR. My exam and finding duplicated as documented in the note above on [place].  I have personally edited and added to the text of this note to reflect my exam and medical assessment and plan.  I have personally reviewed the patient's history and  24 hour interval events, along with vitals, labs, radiology images, and additional findings found in detail with in the note above.  I concur with the above documented assessment and care plan which was developed with and reviewed by me.  See above impressions/recommendations which I have edited to reflect my thoughts.     Chanetta Marshall. Camille Bal, MD,  [place]

## 2018-01-08 NOTE — Plan of Care (Signed)
Problem: Moderate/High Fall Risk Score >5  Goal: Patient will remain free of falls  01/08/2018 1122 by Alinda Money, RN  Outcome: Progressing  Flowsheets (Taken 01/08/2018 1104)  High (Greater than 13): HIGH-Consider use of low bed;HIGH-Activate bed/chair exit alarm where available;HIGH-Initiate use of floor mats as appropriate;HIGH-Apply yellow "Fall Risk" arm band;MOD-Include family in multidisciplinary POC discussions;MOD-Utilize diversion activities;MOD-Use of assistive devices -Bedside Commode if appropriate;MOD-Consider a move closer to Nurses Station;MOD-Initiate Yellow "Fall Risk" magnet communication tool;LOW-Anticoagulation education for injury risk;MOD-Request PT/OT consult order for patients with gait/mobility impairment;MOD-Place Fall Risk level on whiteboard in room;MOD-Remain with patient during toileting;MOD-Re-orient confused patients;MOD-Use of chair-pad alarm when appropriate  01/08/2018 1104 by Alinda Money, RN  Outcome: Progressing  Flowsheets (Taken 01/08/2018 1104)  High (Greater than 13): HIGH-Consider use of low bed;HIGH-Activate bed/chair exit alarm where available;HIGH-Initiate use of floor mats as appropriate;HIGH-Apply yellow "Fall Risk" arm band;MOD-Include family in multidisciplinary POC discussions;MOD-Utilize diversion activities;MOD-Use of assistive devices -Bedside Commode if appropriate;MOD-Consider a move closer to Nurses Station;MOD-Initiate Yellow "Fall Risk" magnet communication tool;LOW-Anticoagulation education for injury risk;MOD-Request PT/OT consult order for patients with gait/mobility impairment;MOD-Place Fall Risk level on whiteboard in room;MOD-Remain with patient during toileting;MOD-Re-orient confused patients;MOD-Use of chair-pad alarm when appropriate     Problem: Safety  Goal: Patient will be free from injury during hospitalization  01/08/2018 1122 by Alinda Money, RN  Outcome: Progressing  Flowsheets (Taken 01/08/2018 1104)  Patient will be free from  injury during hospitalization : Assess patient's risk for falls and implement fall prevention plan of care per policy;Ensure appropriate safety devices are available at the bedside;Assess for patients risk for elopement and implement [place] per policy;Provide alternative method of communication if needed [employer], writing);Include patient/ family/ care giver in decisions related to safety;Provide and maintain safe environment;Use appropriate transfer methods;Hourly rounding  01/08/2018 1104 by Alinda Money, RN  Outcome: Progressing  Flowsheets (Taken 01/08/2018 1104)  Patient will be free from injury during hospitalization : Assess patient's risk for falls and implement fall prevention plan of care per policy;Ensure appropriate safety devices are available at the bedside;Assess for patients risk for elopement and implement [place] per policy;Provide alternative method of communication if needed [employer], writing);Include patient/ family/ care giver in decisions related to safety;Provide and maintain safe environment;Use appropriate transfer methods;Hourly rounding  Goal: Patient will be free from infection during hospitalization  01/08/2018 1122 by Alinda Money, RN  Outcome: Progressing  Flowsheets (Taken 01/08/2018 1104)  Free from Infection during hospitalization: Assess and monitor for signs and symptoms of infection;Monitor all insertion sites (i.e. indwelling lines, tubes, urinary catheters, and drains);Monitor lab/diagnostic results;Encourage patient and family to use good hand hygiene technique  01/08/2018 1104 by Alinda Money, RN  Outcome: Progressing  Flowsheets (Taken 01/08/2018 1104)  Free from Infection during hospitalization: Assess and monitor for signs and symptoms of infection;Monitor all insertion sites (i.e. indwelling lines, tubes, urinary catheters, and drains);Monitor lab/diagnostic results;Encourage patient and family to use good hand  hygiene technique     Problem: Pain  Goal: Pain at adequate level as identified by patient  01/08/2018 1122 by Alinda Money, RN  Outcome: Progressing  Flowsheets (Taken 01/08/2018 1104)  Pain at adequate level as identified by patient: Identify patient comfort function goal;Assess for risk of opioid induced respiratory depression, including snoring/sleep apnea. Alert healthcare team of risk factors identified.;Assess pain on admission, during daily assessment and/or before any "as needed" intervention(s);Evaluate patient's satisfaction with pain management progress;Reassess pain within 30-60  minutes of any procedure/intervention, per Pain Assessment, Intervention, Reassessment (AIR) Cycle;Evaluate if patient comfort function goal is met;Consult/collaborate with Pain Service;Offer non-pharmacological pain management interventions;Consult/collaborate with Physical Therapy, Occupational Therapy, and/or Speech Therapy  01/08/2018 1104 by Alinda Money, RN  Outcome: Progressing  Flowsheets (Taken 01/08/2018 1104)  Pain at adequate level as identified by patient: Identify patient comfort function goal;Assess for risk of opioid induced respiratory depression, including snoring/sleep apnea. Alert healthcare team of risk factors identified.;Assess pain on admission, during daily assessment and/or before any "as needed" intervention(s);Evaluate patient's satisfaction with pain management progress;Reassess pain within 30-60 minutes of any procedure/intervention, per Pain Assessment, Intervention, Reassessment (AIR) Cycle;Evaluate if patient comfort function goal is met;Consult/collaborate with Pain Service;Offer non-pharmacological pain management interventions;Consult/collaborate with Physical Therapy, Occupational Therapy, and/or Speech Therapy     Problem: Side Effects from Pain Analgesia  Goal: Patient will experience minimal side effects of analgesic therapy  01/08/2018 1122 by Alinda Money, RN  Outcome:  Progressing  Flowsheets (Taken 01/08/2018 1104)  Patient will experience minimal side effects of analgesic therapy: Monitor/assess patient's respiratory status (RR depth, effort, breath sounds);Assess for changes in cognitive function;Prevent/manage side effects per LIP orders (i.e. nausea, vomiting, pruritus, constipation, urinary retention, etc.);Evaluate for opioid-induced sedation with appropriate assessment tool (i.e. POSS)  01/08/2018 1104 by Alinda Money, RN  Outcome: Progressing  Flowsheets (Taken 01/08/2018 1104)  Patient will experience minimal side effects of analgesic therapy: Monitor/assess patient's respiratory status (RR depth, effort, breath sounds);Assess for changes in cognitive function;Prevent/manage side effects per LIP orders (i.e. nausea, vomiting, pruritus, constipation, urinary retention, etc.);Evaluate for opioid-induced sedation with appropriate assessment tool (i.e. POSS)     Problem: Discharge Barriers  Goal: Patient will be discharged home or other facility with appropriate resources  01/08/2018 1122 by Alinda Money, RN  Outcome: Progressing  Flowsheets (Taken 01/08/2018 1104)  Discharge to home or other facility with appropriate resources: Provide information on available health resources;Provide appropriate patient education;Initiate discharge planning  01/08/2018 1104 by Alinda Money, RN  Outcome: Progressing  Flowsheets (Taken 01/08/2018 1104)  Discharge to home or other facility with appropriate resources: Provide information on available health resources;Provide appropriate patient education;Initiate discharge planning     Problem: Psychosocial and Spiritual Needs  Goal: Demonstrates ability to cope with hospitalization/illness  01/08/2018 1122 by Alinda Money, RN  Outcome: Progressing  Flowsheets (Taken 01/08/2018 1104)  Demonstrates ability to cope with hospitalizations/illness: Encourage verbalization of feelings/concerns/expectations;Encourage patient to set small goals  for self;Include patient/ patient care companion in decisions;Communicate referral to spiritual care as appropriate;Reinforce positive adaptation of new coping behaviors;Provide quiet environment;Assist patient to identify own strengths and abilities;Encourage participation in diversional activity  01/08/2018 1104 by Alinda Money, RN  Outcome: Progressing  Flowsheets (Taken 01/08/2018 1104)  Demonstrates ability to cope with hospitalizations/illness: Encourage verbalization of feelings/concerns/expectations;Encourage patient to set small goals for self;Include patient/ patient care companion in decisions;Communicate referral to spiritual care as appropriate;Reinforce positive adaptation of new coping behaviors;Provide quiet environment;Assist patient to identify own strengths and abilities;Encourage participation in diversional activity     Problem: Compromised Tissue integrity  Goal: Damaged tissue is healing and protected  01/08/2018 1122 by Alinda Money, RN  Outcome: Progressing  Flowsheets (Taken 01/08/2018 1104)  Damaged tissue is healing and protected : Monitor/assess Braden scale every shift;Increase activity as tolerated/progressive mobility;Provide wound care per wound care algorithm;Relieve pressure to bony prominences for patients at moderate and high risk;Reposition patient every 2 hours and as needed unless able to reposition self;Avoid  shearing injuries;Use incontinence wipes for cleaning urine, stool and caustic drainage. Foley care as needed;Keep intact skin clean and dry;Use bath wipes, not soap and water, for daily bathing;Consult/collaborate with wound care nurse;Monitor external devices/tubes for correct placement to prevent pressure, friction and shearing;Monitor patient's hygiene practices;Encourage use of lotion/moisturizer on skin;Utilize specialty bed  01/08/2018 1104 by Alinda Money, RN  Outcome: Progressing  Flowsheets (Taken 01/08/2018 1104)  Damaged tissue is healing and protected  : Monitor/assess Braden scale every shift;Increase activity as tolerated/progressive mobility;Provide wound care per wound care algorithm;Relieve pressure to bony prominences for patients at moderate and high risk;Reposition patient every 2 hours and as needed unless able to reposition self;Avoid shearing injuries;Use incontinence wipes for cleaning urine, stool and caustic drainage. Foley care as needed;Keep intact skin clean and dry;Use bath wipes, not soap and water, for daily bathing;Consult/collaborate with wound care nurse;Monitor external devices/tubes for correct placement to prevent pressure, friction and shearing;Monitor patient's hygiene practices;Encourage use of lotion/moisturizer on skin;Utilize specialty bed  Goal: Nutritional status is improving  01/08/2018 1122 by Alinda Money, RN  Outcome: Progressing  Flowsheets (Taken 01/08/2018 1104)  Nutritional status is improving: Assist patient with eating;Allow adequate time for meals;Include patient/patient care companion in decisions related to nutrition;Collaborate with Clinical Nutritionist;Encourage patient to take dietary supplement(s) as ordered  01/08/2018 1104 by Alinda Money, RN  Outcome: Progressing  Flowsheets (Taken 01/08/2018 1104)  Nutritional status is improving: Assist patient with eating;Allow adequate time for meals;Include patient/patient care companion in decisions related to nutrition;Collaborate with Clinical Nutritionist;Encourage patient to take dietary supplement(s) as ordered     Problem: Impaired Mobility  Goal: Mobility/Activity is maintained at optimal level for patient  01/08/2018 1122 by Alinda Money, RN  Outcome: Progressing  Flowsheets (Taken 01/08/2018 1104)  Mobility/activity is maintained at optimal level for patient: Increase mobility as tolerated/progressive mobility;Plan activities to conserve energy, plan rest periods;Assess for changes in respiratory status, level of consciousness and/or development of  fatigue;Consult/collaborate with Physical Therapy and/or Occupational Therapy;Reposition patient every 2 hours and as needed unless able to reposition self;Maintain proper body alignment;Encourage independent activity per ability;Perform active/passive ROM  01/08/2018 1104 by Alinda Money, RN  Outcome: Progressing  Flowsheets (Taken 01/08/2018 1104)  Mobility/activity is maintained at optimal level for patient: Increase mobility as tolerated/progressive mobility;Plan activities to conserve energy, plan rest periods;Assess for changes in respiratory status, level of consciousness and/or development of fatigue;Consult/collaborate with Physical Therapy and/or Occupational Therapy;Reposition patient every 2 hours and as needed unless able to reposition self;Maintain proper body alignment;Encourage independent activity per ability;Perform active/passive ROM     Problem: Hemodynamic Status: Cardiac  Goal: Stable vital signs and fluid balance  01/08/2018 1122 by Alinda Money, RN  Outcome: Progressing  Flowsheets (Taken 01/08/2018 1104)  Stable vital signs and fluid balance: Monitor/assess vital signs and telemetry per unit protocol;Assess signs and symptoms associated with cardiac rhythm changes;Monitor for leg swelling/edema and report to LIP if abnormal;Monitor intake/output per unit protocol and/or LIP order;Weigh on admission and record weight daily;Monitor lab values  01/08/2018 1104 by Alinda Money, RN  Outcome: Progressing  Flowsheets (Taken 01/08/2018 1104)  Stable vital signs and fluid balance: Monitor/assess vital signs and telemetry per unit protocol;Assess signs and symptoms associated with cardiac rhythm changes;Monitor for leg swelling/edema and report to LIP if abnormal;Monitor intake/output per unit protocol and/or LIP order;Weigh on admission and record weight daily;Monitor lab values

## 2018-01-08 NOTE — Plan of Care (Signed)
Attending Attestation:     I have seen and personally examined the patient.  I agree with the findings and exam as documented by Dr. Elba Barman with following caveats:     Assessment:  84 yo male with history of HFrEF EF 40%, arthritis, CKD IV, presenting with severe arthritis pain found to have an STEMI with troponin to 10 without chest pain and without EKG changes although EKG is AV paced.  Evaluated by cardiology and deemed not a candidate for catheterization due to the CKD and and family's goals to decline CRRT.    #Acute coronary syndrome-  -Given his underlying CKD and advanced age, we will not pursue cardiac catheterization since patient would need would most likely need hemodialysis.  The family is very clear that they do not want to start hemodialysis or any other invasive measures.  We will continue with medical management  -d/c heparin drip  -Continue aspirin and statin  -Continue toprol  -awaiting echo    #Chronic systolic heart failure with an EF of 40% with moderate MR  -Strict I's and O's, daily weights, fluid restriction of 1.5 L  -resume home lasix and d/c IVF    # Knee pain suspect gout  -would d/c colchicine   -cont allopurinol, tylenol, lidoderm    #CKD 5  -Family has refused dialysis if needed  -Continue sodium bicarb  -Daily BMP    # Hypertension    # Diabetes  -Continue Lantus 10 units daily  -Accu-Cheks q. before meals    DVT prophylaxis change to heparin sq since heparin gtt d/c  Discussed with family at bedside.  Trio rounding completed      Disposition:     Today's date: 01/08/2018  Admit Date: 01/07/2018  4:41 PM  Clinical Milestones: acs  Anticipated discharge needs: none    Archie Patten, MD

## 2018-01-09 ENCOUNTER — Inpatient Hospital Stay (HOSPITAL_COMMUNITY): Payer: Medicare Other

## 2018-01-09 DIAGNOSIS — Z7952 Long term (current) use of systemic steroids: Secondary | ICD-10-CM

## 2018-01-09 DIAGNOSIS — M25462 Effusion, left knee: Secondary | ICD-10-CM

## 2018-01-09 DIAGNOSIS — I214 Non-ST elevation (NSTEMI) myocardial infarction: Secondary | ICD-10-CM

## 2018-01-09 DIAGNOSIS — N189 Chronic kidney disease, unspecified: Secondary | ICD-10-CM

## 2018-01-09 DIAGNOSIS — M25461 Effusion, right knee: Secondary | ICD-10-CM

## 2018-01-09 DIAGNOSIS — M1A9XX0 Chronic gout, unspecified, without tophus (tophi): Secondary | ICD-10-CM

## 2018-01-09 DIAGNOSIS — M170 Bilateral primary osteoarthritis of knee: Secondary | ICD-10-CM

## 2018-01-09 DIAGNOSIS — E790 Hyperuricemia without signs of inflammatory arthritis and tophaceous disease: Secondary | ICD-10-CM

## 2018-01-09 LAB — CBC AND DIFFERENTIAL
Absolute NRBC: 0 10*3/uL (ref 0.00–0.00)
Basophils Absolute Automated: 0.04 10*3/uL (ref 0.00–0.08)
Basophils Automated: 0.5 %
Eosinophils Absolute Automated: 1.53 10*3/uL — ABNORMAL HIGH (ref 0.00–0.44)
Eosinophils Automated: 18.1 %
Hematocrit: 27.8 % — ABNORMAL LOW (ref 37.6–49.6)
Hgb: 9.1 g/dL — ABNORMAL LOW (ref 12.5–17.1)
Immature Granulocytes Absolute: 0.02 10*3/uL (ref 0.00–0.07)
Immature Granulocytes: 0.2 %
Lymphocytes Absolute Automated: 1.41 10*3/uL (ref 0.42–3.22)
Lymphocytes Automated: 16.7 %
MCH: 30.1 pg (ref 25.1–33.5)
MCHC: 32.7 g/dL (ref 31.5–35.8)
MCV: 92.1 fL (ref 78.0–96.0)
MPV: 11.6 fL (ref 8.9–12.5)
Monocytes Absolute Automated: 0.78 10*3/uL (ref 0.21–0.85)
Monocytes: 9.2 %
Neutrophils Absolute: 4.67 10*3/uL (ref 1.10–6.33)
Neutrophils: 55.3 %
Nucleated RBC: 0 /100 WBC (ref 0.0–0.0)
Platelets: 105 10*3/uL — ABNORMAL LOW (ref 142–346)
RBC: 3.02 10*6/uL — ABNORMAL LOW (ref 4.20–5.90)
RDW: 14 % (ref 11–15)
WBC: 8.45 10*3/uL (ref 3.10–9.50)

## 2018-01-09 LAB — BASIC METABOLIC PANEL
BUN: 47 mg/dL — ABNORMAL HIGH (ref 9.0–28.0)
CO2: 19 mEq/L — ABNORMAL LOW (ref 22–29)
Calcium: 8.1 mg/dL (ref 7.9–10.2)
Chloride: 104 mEq/L (ref 100–111)
Creatinine: 2.8 mg/dL — ABNORMAL HIGH (ref 0.7–1.3)
Glucose: 172 mg/dL — ABNORMAL HIGH (ref 70–100)
Potassium: 3.8 mEq/L (ref 3.5–5.1)
Sodium: 134 mEq/L — ABNORMAL LOW (ref 136–145)

## 2018-01-09 LAB — GLUCOSE WHOLE BLOOD - POCT
Whole Blood Glucose POCT: 134 mg/dL — ABNORMAL HIGH (ref 70–100)
Whole Blood Glucose POCT: 177 mg/dL — ABNORMAL HIGH (ref 70–100)
Whole Blood Glucose POCT: 132 mg/dL — ABNORMAL HIGH (ref 70–100)
Whole Blood Glucose POCT: 172 mg/dL — ABNORMAL HIGH (ref 70–100)

## 2018-01-09 LAB — GFR: EGFR: 21.4

## 2018-01-09 MED ORDER — ATORVASTATIN CALCIUM 40 MG PO TABS
40.00 mg | ORAL_TABLET | Freq: Every evening | ORAL | Status: DC
Start: 2018-01-09 — End: 2018-02-01

## 2018-01-09 MED ORDER — HEPARIN SODIUM (PORCINE) 5000 UNIT/ML IJ SOLN
5000.00 [IU] | Freq: Three times a day (TID) | INTRAMUSCULAR | Status: DC
Start: 2018-01-09 — End: 2018-01-10
  Administered 2018-01-09 – 2018-01-10 (×3): 5000 [IU] via SUBCUTANEOUS
  Filled 2018-01-09 (×3): qty 1

## 2018-01-09 MED ORDER — PREDNISONE 5 MG PO TABS
30.00 mg | ORAL_TABLET | Freq: Every day | ORAL | Status: DC
Start: 2018-01-09 — End: 2018-01-10
  Administered 2018-01-09 – 2018-01-10 (×2): 30 mg via ORAL
  Filled 2018-01-09: qty 2
  Filled 2018-01-09: qty 1
  Filled 2018-01-09: qty 2

## 2018-01-09 MED ORDER — FUROSEMIDE 40 MG PO TABS
40.00 mg | ORAL_TABLET | Freq: Every day | ORAL | Status: DC
Start: 2018-01-10 — End: 2018-11-06

## 2018-01-09 NOTE — Progress Notes (Signed)
Transitional Care Management    Patient was readmitted to [facility]  on 01/07/2018(date) with NSTEMI(dx). Patient is currently inpatient status. Writer placed call to inpatient CM Arnold Long [phone]) for collaboration in care. Writer informed Alene Mires that patient daughter was main contact for patient and Probation officer has not been able to reach patient daughter since mid September.(Insert discussion of concerns). Patient will be discharged from TCM program at this time. Writer will continue to follow and assess for program eligibility upon hospital discharge.    Gae Dry, RN MSN  [facility]  Case Manager  [phone]

## 2018-01-09 NOTE — Progress Notes (Signed)
MEDICINE PROGRESS NOTE    Date Time: 01/09/18 6:14 AM  Patient Name: Marco Bruce  Attending Physician: Gean Maidens, MD    Assessment:     Active Hospital Problems    Diagnosis   . Vomiting   . Acute on chronic systolic heart failure   . NSTEMI (non-ST elevated myocardial infarction)   . CKD (chronic kidney disease), stage IV     Mr. Abdalla is a 84 yo male with history of HFrEF EF 40%, gouty arthritis, CKD IV, DMII, HTN presenting with severe arthritis pain found to have an STEMI with troponin to 9.9 without angina or anginal equivalents, and without EKG changes although EKG is AV paced.  Evaluated by cardiology and deemed not a candidate for catheterization due to Marco CKD and and family's goals to decline CRRT.    Plan:   #NSTEMI - likely demand ischemia, troponins peaked at 9.9. EKG - AV paced rhythm. Was briefly on heparin drip until troponins downtended to 7.95 on 10/6 AM.   -- GDMT: Continue ASA, Lipitor 40, Metoprolol 12.5 BID (decreased from home dose for high BP). Hold Valsartan given low BP.  -- D/C Heparin gtt per cardiology. Will start on Heparin for DVT prophylaxis.  -- TTE looking for wall motion abnormalities.  -- [facility] consulted    #Chronic CM w/ reduced EF 40%, G1DD  (Echo in 07/2017) - Does not appear to be acutely volume overloaded despite BNP > 4000. CXR no acute changes. No HF symptoms.  -- s/p IVF 1750L since admission  -- Will encourage PO for now  -- Continue home Lasix 40mg  qday   -- GDMT: Continue Metoprolol, hold Valsartan and Hydralazine (can resume if BP improves)  -- Strict intake and output with daily standing weights.   --1.5 L fluid restriction. Goal net negative 1-2L  -- Continue telemetry  -- Keep electrolytes K>4 and Mg>2    #Arthritis, likely gouty vs. OA; improved on medical therapy, but still limiting mobility.  -- B Knee X-ray: DJD with suprapatellar joint effusions; Uric Acid above goal at 9; CRP and ESR elevated.  -- Discontinue Colchicine 0.3mg /d (as it is relatively  contraindicated in renal failure)  -- Continue APAP q6 scheduled, Lidocaine patches, Allopurinol 100mg /d (home dose) and Neurontin 100mg  TID (home dose), Dilaudid 1mg  q3 prn  -- Low purine diet.  -- Have to avoid NSAIDs given CKD.  -- Rheumatology consulted - added PO Prednisone taper and f/u as outpatient.  -- PT/OT: required max assist to stand due to pain in bilateral knees.    #CKD IV  -Continue sodium bicarb 1300 mg BID  -Family has decided not to pursue LHC due to likely needing CRRT and refusing HD  -Renally dose medications  -- Monitor UOP closely. Bladder scan if needed.    #Thrombocytopenia  -Unclear etiology, may need further workup  -may have some component of bone marrow failure  -Continue to monitor with CBC     #T2DM Hyperglycemia  -Continue home Lantus 10u QHS  -LD SSI  -- Monitor closely while on steroids.    #HTN  -Continue metoprolol at lower dose  -Hold hydralazine, valsartan due to low blood pressures; Re-introduce when possible.    F   E replete to K>4, Mg>2  N Supervise For Meals Frequency: All meals  Diet Mechanical Soft Na restriction, if any: NO ADDED SALT; Fluid restriction: 1200 ML FLUID  G   DVT ppx SCDs    CODE: NO CPR  -  ALLOW NATURAL  DEATH    Safety Checklist:     DVT prophylaxis:  CHEST guideline (See page [id number]) Chemical   Foley: Not present   IVs:  Peripheral IV   PT/OT: Ordered   Daily CBC & or Chem ordered:  SHM/ABIM guidelines (see #5) Yes, due to clinical and lab instability     Patient Lines/Drains/Airways Status    Active PICC Line / CVC Line / PIV Line / Drain / Airway / Intraosseous Line / Epidural Line / ART Line / Line / Wound / Pressure Ulcer / NG/OG Tube     Name:   Placement date:   Placement time:   Site:   Days:    Peripheral IV 01/07/18 Left Hand   01/07/18    1832    Hand   less than 1    Peripheral IV 01/07/18 Right Antecubital   01/07/18    2044    Antecubital   less than 1                Disposition:     Anticipated discharge needs: TBD    Subjective      CC: bilateral knee pain, elevated troponin    Interval History/24 hour events: NAEON    HPI/Subjective:   -- Feels better with improved B knee pain, but has not gotten out of bed yet  -- ROS as below    Review of Systems:   Review of Systems - Review of Systems   Constitutional: Negative for fever.   Respiratory: Negative for cough.    Cardiovascular: Negative for chest pain, palpitations, orthopnea, leg swelling and PND.   Gastrointestinal: Negative for diarrhea, nausea and vomiting.   Musculoskeletal: Positive for joint pain (bilateral knees).     Physical Exam:       VITAL SIGNS PHYSICAL EXAM   Temp:  [97.8 F (36.6 C)-99.9 F (37.7 C)] 98.5 F (36.9 C)  Heart Rate:  [73-85] 74  Resp Rate:  [14-18] 18  BP: (97-122)/(54-60) 122/58  Telemetry: paced rhythm       Intake/Output Summary (Last 24 hours) at 01/09/2018 0355  Last data filed at 01/09/2018 0539  Gross per 24 hour   Intake 619 ml   Output 550 ml   Net 69 ml    Physical Exam  Gen: no acute distress, lying comfortably in bed  HEENT: EOMI, no icterus  Cardiac: RRR no m/r/g   Lungs: CTA bilaterally  Abdomen: obese, soft, non-tender, non-distended  Ext: no edema  Neuro: distal n/v intact, alert  MSK: bilateral knees with lidocaine patches, mild TTP, small effusion palpable above patella, mild pain with ROM; no increased warmth or erythema.  Skin: clean dry and intact       Meds:     Medications were reviewed.    Labs:       Labs (last 72 hours):    Recent Labs   Lab 01/09/18  0239 01/08/18  0115   WBC 8.45 8.11   Hgb 9.1* 11.0*   Hematocrit 27.8* 32.3*   Platelets 105* 112*       Recent Labs   Lab 01/08/18  0517 01/07/18  1829   PT  --  15.0   PT INR  --  1.2*   PTT 142* 46*    Recent Labs   Lab 01/09/18  0239 01/08/18  0115 01/07/18  1829   Sodium 134* 135* 135*   Potassium 3.8 4.5 4.9   Chloride 104 104 103   CO2 19*  17* 15*   BUN 47.0* 42.0* 38.0*   Creatinine 2.8* 3.0* 3.0*   Calcium 8.1 9.4 9.6   Albumin  --   --  4.1   Protein, Total  --   --  6.9    Bilirubin, Total  --   --  1.0   Alkaline Phosphatase  --   --  45   ALT  --   --  6   AST (SGOT)  --   --  41*   Glucose 172* 211* 236*                 Microbiology, reviewed and are significant for:  Microbiology Results     None        Imaging personally reviewed, including:   Knee 4+ Views Left    Result Date: 01/07/2018   1. No acute fracture identified. 2. Progressive tricompartment degenerative changes. 3. Joint effusion. Loel Ro, MD 01/07/2018 6:42 PM    Knee 4+ Views Right    Result Date: 01/07/2018   1. No acute fracture. 2. Severe tricompartment degenerative changes. 3. Joint effusion. Loel Ro, MD 01/07/2018 6:44 PM    Chest Ap Portable    Result Date: 01/07/2018   No acute disease. Stable cardiomegaly. Loel Ro, MD 01/07/2018 6:41 PM      Signed by: Darcella Gasman, DO

## 2018-01-09 NOTE — Discharge Summary (Signed)
MEDICINE DISCHARGE SUMMARY    Date Time: 01/10/18 4:21 PM  Patient Name: Marco Bruce  Attending Physician: No att. providers found  Primary Care Physician: Danella Sensing, MD    Date of Admission: 01/07/2018  Date of Discharge: 01/10/2018    Discharge Diagnoses:     Principal Diagnosis (Diagnosis after study, that is chiefly responsible for admission to inpatient status): NSTEMI (non-ST elevated myocardial infarction)  Active Hospital Problems    Diagnosis POA   . Principal Problem: NSTEMI (non-ST elevated myocardial infarction) Yes   . Gout of right knee Yes   . Gout of left knee Yes   . Vomiting Yes   . CKD (chronic kidney disease), stage IV Yes     Chronic      Resolved Hospital Problems   No resolved problems to display.       Disposition:      Home with family    Pending Results, Recommendations & Instructions to providers after discharge:     1. Micro / Labs / Path pending:   Unresulted Labs     None      2. Date of completion for antibiotics or other medications: Prednisone taper 30mg  x 1, 20mg  x 3, 10mg  x 3 (Stop Date 01/17/2018)    Recent Labs - Last 2:       Recent Labs   Lab 01/10/18  0348 01/09/18  0239   WBC 4.49 8.45   Hgb 9.8* 9.1*   Hematocrit 29.7* 27.8*   Platelets 121* 105*       Recent Labs   Lab 01/08/18  0517 01/07/18  1829   PT  --  15.0   PT INR  --  1.2*   PTT 142* 46*     Recent Labs   Lab 01/07/18  1829   Alkaline Phosphatase 45   Bilirubin, Total 1.0   Bilirubin, Direct 0.4   Protein, Total 6.9   Albumin 4.1   ALT 6   AST (SGOT) 41*      Recent Labs   Lab 01/10/18  0348 01/09/18  0239   Sodium 133* 134*   Potassium 4.4 3.8   Chloride 100 104   CO2 20* 19*   BUN 56.0* 47.0*   Glucose 211* 172*   Calcium 8.1 8.1                 Invalid input(s): FREET4         Procedures/Radiology performed:   Radiology: all results from this admission  Knee 4+ Views Left    Result Date: 01/07/2018   1. No acute fracture identified. 2. Progressive tricompartment degenerative changes. 3. Joint  effusion. Loel Ro, MD 01/07/2018 6:42 PM    Knee 4+ Views Right    Result Date: 01/07/2018   1. No acute fracture. 2. Severe tricompartment degenerative changes. 3. Joint effusion. Loel Ro, MD 01/07/2018 6:44 PM    Chest Ap Portable    Result Date: 01/07/2018   No acute disease. Stable cardiomegaly. Loel Ro, MD 01/07/2018 6:41 PM    Surgery: all results from this admission  * No surgery found [name] Course:     Reason for admission/ HPI: As per Dr. Edger House (admitting resident)    Marco Bruce is a 84 y.o. male who presents to the hospital with significant pain from arthritis found to have NSTEMI.    Coughing since January but worse yesterday improved with mucinex. Joint pains since last  night and stoped moving. Nausea and vomiting started this morning. Usually ambulates with a walker. No falls.    Fluctuates between feeling cold and hot. Denied any chest pain. No worsening shortness of breath with walking. Denies urinary symptoms.    Gout many years ago. L knee hurts more than R knee. States that diovan causes him pain.     Has been cutting back on drinking water due to prior admission for volume overload and heart failure exacerbation. Decreased appetite and endorses insomnia. 7 lb weight loss in the few months.    Daughter applies [place] "cupping" technique to alleviate remedies.     Hospital Course:     Cardiac  Patient was admitted for elevated Troponin and to 9.9 and trended to 7.95. BNP also elevated at 4529. Because the patient and family declines dialysis, he does not wish to undergo cardiac catheretization. He was initially started on a Heparin GTT, but as his troponins had peaked by D#2 of admission and he was symptom free without angina or anginal equivalents, this was discontinued. While BNP was elevated, he appeared clinically euvolemic without HF symptoms and was resumed on his home dose of Lasix 40mg  PO. He is to follow up with both     MSK  To address his primary concern of  knee pain, he was started on home dose of Gabapentin 100 TID (cannot increase dose due to CKD IV), Allopurinol 100 daily, APAP 650 q6, Lidocaine 5% patches. This helped with pain at rest but his pain was exacerbated by movement and thus limited his baseline mobility of walking with a walker. Rheumatology was consulted and he was started on a Prednisone taper (30mg  x 3, 20mg  x 3, 10mg  x 3) and this dramatically improved his symptoms. On discharge, knee pain was much improved after initiation of Prednisone. He was able to get up and ambulate with physical therapy and near his usual baseline with a walker prior to discharge. He received two doses of Prednisone while here in the hospital. He is being discharged with HHOT and HHPT and f/u with rheumatology to address chronic gout flares (uric acid was above goal at 9, but urate lowering options are limited by his CKD)    [name]  Of note, patient has had recurrent admissions and ED visits for bilateral knee pain (gout vs. Osteoarthritis) and has been evaluated frequently for cardiac problems. It should be recognized that at this time the patient has well established GOC and has declined cardiac catherization and hemodialysis. He is no CPR-AND. No formal geriatrics/palliative consultation was done at this time. While this may change in the future, consideration of his goals of care prior to admission should be addressed. To address recurrent complaints of knee pain, he is to follow up with outpatient rheumatology to better manage his Gout flares.      Discharge Day Exam:  Temp:  [97.7 F (36.5 C)-98.9 F (37.2 C)] 97.8 F (36.6 C)  Heart Rate:  [69-77] 71  Resp Rate:  [18-20] 18  BP: (117-125)/(58-61) 118/61    Gen: no acute distress, lying comfortably in bed  HEENT: EOMI, no icterus  Cardiac: RRR no m/r/g   Lungs: CTA bilaterally  Abdomen: obese, soft, non-tender, non-distended  Ext: no edema  Neuro: distal n/v intact, alert  MSK: bilateral knees with lidocaine patches,  mild TTP to R lateral joint line, minimal effusion palpable above patella, full bilateral ROM except slightly decreased on R 2/2 tight hamstrings; no increased warmth or erythema.  Skin: clean dry and intact    Consultations:     Treatment Team:   Resident: Gerrit Friends, MD  Resident: Ezekiel Slocumb, MD  Resident: Darcella Gasman, DO  Consulting Physician: Sherin Quarry, MD    Discharge Condition:     Stable    Discharge Instructions & Follow Up Plan for Patient:     Diet: mechanical soft with no salt added; 1200 fluid restriction; low purine    Activity/Weight Bearing Status: as tolerated with walker    Patient was instructed to follow up with:     Follow-up Information     Danella Sensing, MD Follow up.    Specialty:  Family Medicine  Why:  Follow up in 3-5 days.   Contact information:  [address]  [place] [address]  [phone]             Queen Slough, MD Follow up in 1 week(s).    Specialties:  Nephrology, Internal Medicine  Contact information:  [address]  [place] [address]  [phone]             Waymond Cera, MD Follow up in 1 week(s).    Specialty:  Internal Medicine  Why:  Regarding the pain in your knees in 1-2 weeks or earlier if the pain in your knees get worse.  Contact information:  [place] [address]  [phone]             Mosetta Putt, MD Follow up in 1 week(s).    Specialties:  Rheumatology, Internal Medicine  Why:  If Dr. Jimmy Picket is not available to see you in 1-2 weeks.  Contact information:  [address]  [place] [address]  [phone]             [facility] Follow up in 1 week(s).    Contact information:  [address]  [place]  [phone]           [place] Call in 2 days.    Why:  As needed if no contact from home health nurse  Contact information:  [place] [address]  [phone]               Discharge  Code Status: NO CPR    Complete instructions and follow up are in the patient's After Visit Summary    Minutes spent coordinating discharge and reviewing discharge plan:60 minutes    Discharge Medications:        Discharge Medication List      Taking    acetaminophen 325 MG tablet  Dose:  650 mg  What changed:     when to take this   reasons to take this  Commonly known as:  TYLENOL  Take 2 tablets (650 mg total) by mouth every 6 (six) hours for 3 days     albuterol-ipratropium 2.5-0.5(3) mg/3 mL nebulizer  Dose:  3 mL  Commonly known as:  DUO-NEB  Take 3 mLs by nebulization 2 (two) times daily as needed (SOB)     allopurinol 100 MG tablet  Dose:  100 mg  Commonly known as:  ZYLOPRIM  Take 1 tablet (100 mg total) by mouth daily     aspirin 81 MG chewable tablet  Dose:  81 mg  Chew 1 tablet (81 mg total) by mouth daily     atorvastatin 40  MG tablet  Dose:  40 mg  What changed:     medication strength   how much to take   when to take this  Commonly known as:  LIPITOR  Take 1 tablet (40 mg total) by mouth nightly  Notes to patient:  Lowers cholesterol     cetirizine 10 MG tablet  Dose:  10 mg  Commonly known as:  ZyrTEC  Take 1 tablet (10 mg total) by mouth daily as needed for Rhinitis.     dextromethorphan-guaiFENesin 30-600 MG per 12 hr tablet  Dose:  1 tablet  Commonly known as:  MUCINEX DM  Take 1 tablet by mouth 2 (two) times daily as needed (cough)     furosemide 40 MG tablet  Dose:  40 mg  What changed:  medication strength  Commonly known as:  LASIX  Take 1 tablet (40 mg total) by mouth daily     gabapentin 100 MG capsule  Commonly known as:  NEURONTIN  TAKE ONE CAPSULE BY MOUTH EVERY 8 HOURS     insulin glargine 100 UNIT/ML injection pen  Dose:  10 Units  For:  Type 2 Diabetes  Inject 10 Units into the skin nightly     lidocaine 5 %  Dose:  2 patch  Commonly known as:  LIDODERM  Start taking on:  January 11, 2018  Place 2 patches onto the skin every 24 hours for 3 days Remove & Discard patch within 12  hours or as directed by MD     linaGLIPtin 5 MG Tabs  Dose:  5 mg  Take 1 tablet (5 mg total) by mouth daily     metoprolol succinate XL 25 MG 24 hr tablet  Dose:  12.5 mg  What changed:  how much to take  Commonly known as:  TOPROL-XL  For:  Heart Failure, High Blood Pressure Disorder  Start taking on:  January 11, 2018  Take 0.5 tablets (12.5 mg total) by mouth daily     pantoprazole 40 MG tablet  Dose:  40 mg  Commonly known as:  PROTONIX  Take 40 mg by mouth nightly     predniSONE 10 MG tablet  Commonly known as:  DELTASONE  For:  Acute Joint Inflammation in Gout  Take 30mg  01/11/2018 morning with food (3 tabs), 20mg  for 3 more days (2 tabs), then 10mg  for 3 more days (1 tab)     RISAQUAD PO  Dose:  1 capsule  Take 1 capsule by mouth daily.     senna-docusate 8.6-50 MG per tablet  Dose:  2 tablet  Commonly known as:  PERICOLACE  Take 2 tablets by mouth 2 (two) times daily     sodium bicarbonate 650 MG tablet  Dose:  1,300 mg  Take 2 tablets (1,300 mg total) by mouth 2 (two) times daily        STOP taking these medications    hydrALAZINE 50 MG tablet  Commonly known as:  APRESOLINE     valsartan 80 MG tablet  Commonly known as:  [place]  Department of Medicine  P: [phone]  F: [phone]    Signed by: Darcella Gasman, DO    CC: Danella Sensing, MD

## 2018-01-09 NOTE — Plan of Care (Addendum)
Problem: Safety  Goal: Patient will be free from injury during hospitalization  Outcome: Progressing  Flowsheets (Taken 01/09/2018 1210)  Patient will be free from injury during hospitalization : Assess patient's risk for falls and implement fall prevention plan of care per policy; Provide and maintain safe environment; Use appropriate transfer methods; Ensure appropriate safety devices are available at the bedside; Include patient/ family/ care giver in decisions related to safety; Hourly rounding; Assess for patients risk for elopement and implement Elopement Risk Plan per policy  Goal: Patient will be free from infection during hospitalization  Outcome: Progressing  Flowsheets (Taken 01/09/2018 1210)  Free from Infection during hospitalization: Assess and monitor for signs and symptoms of infection; Monitor lab/diagnostic results; Monitor all insertion sites (i.e. indwelling lines, tubes, urinary catheters, and drains); Encourage patient and family to use good hand hygiene technique     Problem: Pain  Goal: Pain at adequate level as identified by patient  Outcome: Progressing  Flowsheets (Taken 01/09/2018 1210)  Pain at adequate level as identified by patient: Identify patient comfort function goal; Assess for risk of opioid induced respiratory depression, including snoring/sleep apnea. Alert healthcare team of risk factors identified.; Assess pain on admission, during daily assessment and/or before any "as needed" intervention(s); Reassess pain within 30-60 minutes of any procedure/intervention, per Pain Assessment, Intervention, Reassessment (AIR) Cycle; Evaluate if patient comfort function goal is met; Evaluate patient's satisfaction with pain management progress; Offer non-pharmacological pain management interventions; Include patient/patient care companion in decisions related to pain management as needed     Problem: Impaired Mobility  Goal: Mobility/Activity is maintained at optimal level for  patient  Outcome: Progressing  Flowsheets (Taken 01/09/2018 1210)  Mobility/activity is maintained at optimal level for patient: Increase mobility as tolerated/progressive mobility; Encourage independent activity per ability; Maintain proper body alignment; Perform active/passive ROM; Plan activities to conserve energy, plan rest periods; Reposition patient every 2 hours and as needed unless able to reposition self; Assess for changes in respiratory status, level of consciousness and/or development of fatigue; Consult/collaborate with Physical Therapy and/or Occupational Therapy     Problem: Hemodynamic Status: Cardiac  Goal: Stable vital signs and fluid balance  Outcome: Progressing  Flowsheets (Taken 01/09/2018 1210)  Stable vital signs and fluid balance: Monitor/assess vital signs and telemetry per unit protocol; Weigh on admission and record weight daily; Assess signs and symptoms associated with cardiac rhythm changes; Monitor intake/output per unit protocol and/or LIP order; Monitor lab values; Monitor for leg swelling/edema and report to LIP if abnormal     Comments: Pt is AOx4. HR 70s to 80s. SBP low 100s. NSR w/ a BBB. SpO2 WNL on RA. Per PT/OT, pt is max assist to chair d/t knee pain. PRN Dilaudid given. Pt is afebrile and denies SOB and dizziness. Safety maintained. Hourly rounding. Bed low and locked, bedside table and call bell within reach, bed alarm on, and fall mat in place.    Plan: SNF recommended upon discharge             Echo pending             Rheumatology consulted (started on prednisone)

## 2018-01-09 NOTE — Consults (Signed)
CONSULTATION    Date Time: 01/09/18 2:01 PM  Patient Name: [facility]  Requesting Physician: Gean Maidens, MD      Reason for Consultation:   gout    Assessment:   Marco Bruce is a 84 y.o. male with PMH of HFrEF, OA, gout, CKD admitted with recurrent b/l knee pain/swelling and found to have NSTEMI    Plan:   1. B/l acute knee pain/swelling  -xrays with DJD and small effusions  -symptoms c/w previous gout flares, no fevers/chills and given b/l unlikely infectious  -suggest treating flare with prednisone 30mg /day x 3 days, 20mg /day x 3 days, 10mg /d x 3 days then stop  -cont allopurinol at current dose for now    2. Chronic gout/hyperuricemia  -cont allopurinol home dose of allopurinol 100mg /d  -uric acid not at goal, currently 9  -will eventually need increase dosing, but not during acute flare  -low purine diet  -pt to f/up with outpt rheumatology for further management     3. NSTEMI/HF  -as per cardiology/primary team    4. CKD  -avoid nsaids/colchicine  -hyperuricemia can contribute to renal ds    5. High risk medication use  -with steroids, start ca/vitD for bone health  -with steroids start GI ppx      Pt to f/up with outpt rheumatology 1-2 weeks after discharge.  Pt lives in [place], prefers rheumatologist there: can see Dr. Jimmy Picket or Dr. Karren Burly.  Otherwise if needed can f/up with [facility]ology.    Discussed plan with pt, pts son and Dr. Noberto Retort    History:   Marco Bruce is a 84 y.o. male who presents to the hospital on 01/07/2018 with PMH of HFrEF, OA, gout, CKD admitted with recurrent b/l knee pain/swelling and found to have NSTEMI.  Pt has long standing gout with recently more frequent flares. Per son, pt has had known gout for >20 years. He has been on allopurinol 100mg /day for quite some time.  He has had frequent flares esp in the knees over the past year.  He gets relief with prednisone tapers.  This flare started 3 days ago. He has trouble walking due to the knee pain.  No other tender/swollen  joints. He denies fevers/chills night sweats.  He has not missed allopurinol doses.  He does not drink etoh.  He denies FH of gout.           Past Medical History:     Past Medical History:   Diagnosis Date   . Acute diastolic heart failure 71/09/2692   . Acute on chronic diastolic congestive heart failure 08/13/2016   . Acute pulmonary edema 05/26/2015   . ARF (acute renal failure) 05/26/2015   . Chronic diastolic heart failure 85/46/2703   . Chronic gout    . Chronic pain syndrome 07/07/2015   . CKD (chronic kidney disease) stage 4, GFR 15-29 ml/min     2018 baseline creatinine in EPIC is 1.5-2.3.   . Congestive heart failure (CHF)     EF 40% 5/18 echo, on lasix   . Current chronic use of systemic steroids     For Polymyalgia rheumatica   . Essential hypertension    . Mixed hyperlipidemia    . NSTEMI (non-ST elevated myocardial infarction) 08/13/2016   . Osteoarthritis of both knees    . Peripheral edema 06/16/2015   . Pneumonia due to infectious organism, unspecified laterality, unspecified part of lung 06/25/2015   . Polymyalgia rheumatica     chronic steroids   .  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 Second degree heart block by electrocardiogram (ECG) 04/27/2015   . Type 2 diabetes mellitus with complication, with long-term current use of insulin     12/01/2016 Hemoglobin A1c 8.2%       Past Surgical History:     Past Surgical History:   Procedure Laterality Date   . ABDOMINAL SURGERY     . CHOLECYSTECTOMY     . PACEMAKER Left 2017       Family History:     Family History   Problem Relation Age of Onset   . Hypertension Father        Social History:     Social History     Socioeconomic History   . Marital status: Widowed     Spouse name: Not on file   . Number of children: Not on file   . Years of education: Not on file   . Highest education level: Not on file   Occupational History   . Not on file   Social Needs   . Financial resource strain: Not on file   . Food insecurity:     Worry: Not on file      Inability: Not on file   . Transportation needs:     Medical: Not on file     Non-medical: Not on file   Tobacco Use   . Smoking status: Never Smoker   . Smokeless tobacco: Never Used   Substance and Sexual Activity   . Alcohol use: No     Comment: former alcoholic   . Drug use: No   . Sexual activity: Not on file   Lifestyle   . Physical activity:     Days per week: Not on file     Minutes per session: Not on file   . Stress: Not on file   Relationships   . Social connections:     Talks on phone: Not on file     Gets together: Not on file     Attends religious service: Not on file     Active member of club or organization: Not on file     Attends meetings of clubs or organizations: Not on file     Relationship status: Not on file   . Intimate partner violence:     Fear of current or ex partner: Not on file     Emotionally abused: Not on file     Physically abused: Not on file     Forced sexual activity: Not on file   Other Topics Concern   . Not on file   Social History Narrative   . Not on file       Allergies:   No Known Allergies    Medications:     Current Facility-Administered Medications   Medication Dose Route Frequency   . acetaminophen  650 mg Oral 4 times per day   . allopurinol  100 mg Oral Daily   . aspirin  81 mg Oral Daily   . atorvastatin  40 mg Oral QHS   . cetirizine  10 mg Oral Daily   . furosemide  40 mg Oral Daily   . gabapentin  100 mg Oral Q8H   . insulin glargine  10 Units Subcutaneous QHS   . insulin lispro  1-3 Units Subcutaneous QHS   . insulin lispro  1-5 Units Subcutaneous TID AC   . lactobacillus/streptococcus  1 capsule Oral Daily   . lidocaine  2  patch Transdermal Q24H   . metoprolol succinate XL  12.5 mg Oral Daily   . pantoprazole  40 mg Oral QAM AC   . senna-docusate  2 tablet Oral BID   . sodium bicarbonate  1,300 mg Oral Q12H [place]   . sodium chloride  1,000 mL Intravenous Once       Review of Systems:   All systems reviewed and negative except as per HPI     Physical Exam:      Vitals:    01/09/18 1100   BP: 101/57   Pulse: 73   Resp: 18   Temp: 98.6 F (37 C)   SpO2: 96%       Intake and Output Summary (Last 24 hours) at Date Time    Intake/Output Summary (Last 24 hours) at 01/09/2018 1401  Last data filed at 01/09/2018 1157  Gross per 24 hour   Intake 550 ml   Output 775 ml   Net -225 ml       General appearance - alert, tired appearing, and in minimal distress  Mental status - alert, oriented to person, place, and time  Eyes - pupils equal and reactive, extraocular eye movements intact  Ears - no tophi noted  Chest - clear to auscultation, no wheezes, rales or rhonchi, symmetric air entry  Heart - normal rate, regular rhythm, normal S1, S2, no murmurs, rubs, clicks or gallops  Abdomen - soft, nontender, nondistended, no masses or organomegaly  Musculoskeletal - b/l knees warm, swollen, ttp, no erythema. No other tender/swollen joints on comprehensive joint exam  Extremities - pedal edema none+, intact peripheral pulses  Skin - no rash, no tophi    Labs Reviewed:     Results     Procedure Component Value Units Date/Time    Glucose Whole Blood - POCT [[id number]]  (Abnormal) Collected:  01/09/18 1146     Updated:  01/09/18 1155     POCT - Glucose Whole blood 177 mg/dL     Glucose Whole Blood - POCT [[id number]]  (Abnormal) Collected:  01/09/18 0640     Updated:  01/09/18 0649     POCT - Glucose Whole blood 134 mg/dL     CBC with differential [[id number]]  (Abnormal) Collected:  01/09/18 0239    Specimen:  Blood Updated:  01/09/18 0345     WBC 8.45 x10 3/uL      Hgb 9.1 g/dL      Hematocrit 27.8 %      Platelets 105 x10 3/uL      RBC 3.02 x10 6/uL      MCV 92.1 fL      MCH 30.1 pg      MCHC 32.7 g/dL      RDW 14 %      MPV 11.6 fL      Neutrophils 55.3 %      Lymphocytes Automated 16.7 %      Monocytes 9.2 %      Eosinophils Automated 18.1 %      Basophils Automated 0.5 %      Immature Granulocyte 0.2 %      Nucleated RBC 0.0 /100 WBC      Neutrophils Absolute 4.67 x10 3/uL      Abs Lymph  Automated 1.41 x10 3/uL      Abs Mono Automated 0.78 x10 3/uL      Abs Eos Automated 1.53 x10 3/uL      Absolute Baso Automated 0.04 x10 3/uL  Absolute Immature Granulocyte 0.02 x10 3/uL      Absolute NRBC 0.00 x10 3/uL     Basic Metabolic Panel [[id number]]  (Abnormal) Collected:  01/09/18 0239    Specimen:  Blood Updated:  01/09/18 0324     Glucose 172 mg/dL      BUN 47.0 mg/dL      Creatinine 2.8 mg/dL      Calcium 8.1 mg/dL      Sodium 134 mEq/L      Potassium 3.8 mEq/L      Chloride 104 mEq/L      CO2 19 mEq/L     GFR [[id number]] Collected:  01/09/18 0239     Updated:  01/09/18 0324     EGFR 21.4    Glucose Whole Blood - POCT [[id number]]  (Abnormal) Collected:  01/08/18 2112     Updated:  01/08/18 2213     POCT - Glucose Whole blood 144 mg/dL     Glucose Whole Blood - POCT [[id number]]  (Abnormal) Collected:  01/08/18 1543     Updated:  01/08/18 1550     POCT - Glucose Whole blood 201 mg/dL           Rads:   Radiological Procedure reviewed.     Signed by: Sherin Quarry

## 2018-01-09 NOTE — OT Eval Note (Signed)
[facility]   Occupational Therapy Evaluation     Patient: Marco Bruce    MRN#: [id number]   [facility]: [facility]  Bed: QA834/HD622-29                                     Discharge Recommendations:   Discharge Recommendation: SNF   DME Recommended for Discharge: (TBD at SNF)    If SNF recommended discharge disposition is not available, patient will need Max-Total assist for self-care, mobility and transfers, [facility] lift, [facility] and hospital bed equipment, and [place] with additional HHA.       Assessment:   Marco Bruce is a 84 y.o. male admitted 01/07/2018.   Expanded chart review completed including review of labs, review of imaging, review of vitals, review of H&P and physician progress notes and review of consulting physician notes .  Pt's ability to complete ADLs and functional transfers is impaired due to the following deficits:  decreased activity tolerance, decreased balance, gait impairment, pain, decreased strength and transfers .  Pt demonstrates performance deficits with feeding, grooming, bathing, dressing, toileting and functional mobility. There are a few comorbidities or other factors that affect plan of care and require modification of task including: assistive device needed for mobility, chronic pain and has stairs to manage. Pt pleasant and cooperative, requiring MaxA for bed mobility, scooting, sit <> stand transfer x3 trials, SPT EOB -> BSC -> Chair and for LB ADLs. Pt and pt's grandson educated extensively on POC, therapeutic process and all questions answered. Pt below baseline and will benefit from continued OT services while in house and at time of D/C to promote a return to highest level of functional performance.     Therapy Diagnosis: Decreased balance, decreased strength  Rehabilitation Potential:  Good for stated goals with continued Occupational Therapy treatment intervention    Treatment Activities: OT evaluation, self-care, thera-ex, patient/family received education and  demonstrates understanding and carryover.   Educated the patient to role of occupational therapy, plan of care, goals of therapy and safety with mobility and ADLs.    Plan:   OT Frequency Recommended: 2-3x/wk     Treatment/Interventions: ADL training, functional transfer training, modified bed mobility training, education on energy conservation/fall prevention techniques, compensatory technique education, UE strengthening, endurance training, Relaxation/pain management techniques, education on medical equipment beneficial for increasing ADL performance and safety in home environment.      Risks/benefits/POC discussed: Yes       Precautions and Contraindications:    Fall risk, Khmer speaking - grandson interprets     Scientific laboratory technician received for [name] for OT Evaluation and Treatment.  Patient's medical condition is appropriate for Occupational Therapy intervention at this time.      History of Present Illness:    Marco Bruce is a 84 y.o. male admitted on 01/07/2018 with "history of HFrEF EF 40%,arthritis,CKDIV,presenting with severe arthritis pain found to have an STEMI with troponin to 10 without chest pain and without EKG changes although EKG is AV paced.Evaluated by cardiology and deemed not a candidate for catheterization due to the CKD and and family's goals to decline CRRT" - MD note    Admitting Diagnosis: Acute on chronic systolic heart failure [N98.92]  Pacemaker [Z95.0]  CKD (chronic kidney disease), stage IV [N18.4]  NSTEMI (non-ST elevated myocardial infarction) [I21.4]  Vomiting, intractability of vomiting not specified, presence of nausea not specified, unspecified vomiting type [  R11.10]    Past Medical/Surgical History:  Past Medical History:   Diagnosis Date   . Acute diastolic heart failure 67/89/3810   . Acute on chronic diastolic congestive heart failure 08/13/2016   . Acute pulmonary edema 05/26/2015   . ARF (acute renal failure) 05/26/2015   . Chronic diastolic heart failure 17/51/0258   . Chronic  gout    . Chronic pain syndrome 07/07/2015   . CKD (chronic kidney disease) stage 4, GFR 15-29 ml/min     2018 baseline creatinine in EPIC is 1.5-2.3.   . Congestive heart failure (CHF)     EF 40% 5/18 echo, on lasix   . Current chronic use of systemic steroids     For Polymyalgia rheumatica   . Essential hypertension    . Mixed hyperlipidemia    . NSTEMI (non-ST elevated myocardial infarction) 08/13/2016   . Osteoarthritis of both knees    . Peripheral edema 06/16/2015   . Pneumonia due to infectious organism, unspecified laterality, unspecified part of lung 06/25/2015   . Polymyalgia rheumatica     chronic steroids   . 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 Second degree heart block by electrocardiogram (ECG) 04/27/2015   . Type 2 diabetes mellitus with complication, with long-term current use of insulin     12/01/2016 Hemoglobin A1c 8.2%     Past Surgical History:   Procedure Laterality Date   . ABDOMINAL SURGERY     . CHOLECYSTECTOMY     . PACEMAKER Left 2017       Imaging/Tests/Labs:  Knee 4+ Views Left    Result Date: 01/07/2018   1. No acute fracture identified. 2. Progressive tricompartment degenerative changes. 3. Joint effusion. Loel Ro, MD 01/07/2018 6:42 PM    Knee 4+ Views Right    Result Date: 01/07/2018   1. No acute fracture. 2. Severe tricompartment degenerative changes. 3. Joint effusion. Loel Ro, MD 01/07/2018 6:44 PM    Chest Ap Portable    Result Date: 01/07/2018   No acute disease. Stable cardiomegaly. Loel Ro, MD 01/07/2018 6:41 PM      Social History:   Prior Level of Function: Grandson assists with ADLs and family provides all IADLs  DME Currently at Home: RW, Manual W/C  Home Living Arrangements: Home with family, 24/7 (S) and support  Type of Home: Multi-level  Home Layout: 12 STE with 1st floor set-up    Subjective:     Patient is agreeable to participation in the therapy session. Nursing clears patient for therapy.     Patient Goal:  "Go Home"  Pain:   Scale: 0/10  Location:  NA  Intervention: RN made aware, positioning and rest     Objective:   Patient is in bed with telemetry in place.      Cognitive Status and Neuro Exam:   Alert and oriented x4    Musculoskeletal Examination  RUE ROM: grossly WFL  LUE ROM: grossly WFL  RLE ROM: WFL grossly   LLE ROM: WFL grossly     RUE Strength: 3+ /5 grossly   LUE Strength: 3+ /5 grossly   RLE Strength: 3+/5 grossly   LLE Strength: 3+/5 grossly     Sensory/Oculomotor Examination  Auditory:  appears WFL, pt able to hear writer without noted difficulty   Tactile:  grossly intact  Vision:  appears WFL; no deficits noted in acuity or tracking.    Coordination: WFL       Activities of Daily Living  Eating:  hand to mouth WFL   Grooming: MinA   Bathing: NT  UE Dressing: MinA  LE Dressing: MaxA  Toileting: MaxA    Functional Mobility:  Supine to Sit: MaxA  Sit to Stand: MaxA  Transfers: MaxA     Balance  Static Sitting: CGA  Dynamic Sitting: CGA  Static Standing: MaxA  Dynamic Standing: MaxA - SPT    Participation and Activity Tolerance  Participation Effort: good  Endurance:  Fair -    Patient left with call bell within reach, all needs met, SCDs off, fall mat down, chair alarm on and all questions answered. RN notified of session outcome and patient response.       Goals:  Time For Goal Achievement: 5 visits  ADL Goals  Patient will groom self: Minimal Assist, at edge of bed  Mobility and Transfer Goals  Pt will transfer bed to toilet: Minimal Assist, with rolling walker  Neuro Re-Ed Goals  Pt will perform dynamic standing balance: Minimal Assist, for 5 minutes, to complete standing ADLs safely                        Rae Halsted OTR/L, CSRS  Pager [phone]      Time of treatment:   OT Received On: 01/09/18  Start Time: 5852  Stop Time: 1145  Time Calculation (min): 40 min

## 2018-01-09 NOTE — Plan of Care (Addendum)
Patient A/Ox3, speaks Khmer, family at bedside to translate, waiver signed and in chart. VS as per flow sheet, NSR with BBB on tele, HR 70s-90s. Patient denies chest pain/discomfort or SOB, remains on RA throughout shift. Pt reports pain to bilateral knees, relieved with scheduled Tylenol, gabapentin and lidocaine patches. All needs addressed, remains on bedrest. Bed alarm and floor mat in place. Call bell and family at bedside.      Plan: Monitor/manage bilateral knee pain. Lasix 40 mg PO daily. Nephrology consult. PT/OT ordered. ECHO ordered. Discharge planning?

## 2018-01-09 NOTE — Progress Notes (Signed)
Patient seen and examined by me. Updated plan below, full progress note to follow.    Pt reports still having pain in knees, especially after working with PT, 10/10 today.    GEN: NAD  CV: RRR, no m/r/g  Pulm: CTA b/l, no w/r/r  Abd: soft, NT/ND, +BS  Ext: no LE edema    A/P:  84yo male with h/o CKD, HTN, HFpEF, DM, gout who p/w b/l knee pain concerning for gout flare. Found to have elevated troponin 9.9 and possible ST elevations on EKG although difficult to interpret given pt is atrially paced.    # NSTEMI  # gout flare  # knee pain  # HFpEF   # CKD stage IV  # HTN  # DM    Plan:  - cardiology following, appreciate recs. Cont ASA, metoprolol, lasix. Not on ACEi given renal function  - renal function at baseline. No plan for cardiac catheterization given concern that pt will require HD post-cath. Pt has refused HD.  - rheumatology consult, recommended prednisone taper for gout flare. Will need outpt rheumatology   - lantus 10U, SSI  - goals of care discussion with pt and family. Given frequent hospitalizations and CKD/HFpEF and refusal to start HD, may benefit from palliative care discussion  DVT ppx: heparin SC

## 2018-01-09 NOTE — Progress Notes (Addendum)
[facility]      Date Time: 01/09/18 8:31 AM  Patient Name: [facility]  Medical Record #:  [id number]  Account#:  [id number]  Admission Date:  01/07/2018         Patient Active Problem List   Diagnosis   . CKD (chronic kidney disease), stage IV   . Renovascular hypertension   . NSTEMI (non-ST elevated myocardial infarction)   . Hyponatremia   . Essential hypertension   . Acute on chronic diastolic congestive heart failure   . Hypokalemia   . Mixed hyperlipidemia   . Type 2 diabetes mellitus with complication, with long-term current use of insulin   . Acute gout of right knee   . Acute renal failure superimposed on stage 3 chronic kidney disease   . Acute on chronic systolic heart failure   . Acute bronchitis due to other specified organisms   . Pacemaker   . Bronchitis   . Diaphragm paralysis   . Vomiting       Subjective:   Denies chest pain, SOB or palpitations.      Assessment:    Admitted with weakness, N/V. Arthritis pain, possible gout   Elevated sed rate and CRP   Elevated BNP   Troponin 10 in setting of CKD with Cr 3--likely demand process with CHF with underlying CKD. Not a candidate for cath given CKD and risk for CRRT    Stage C systolic HF (LVEF 82% with moderate MR)   Biotronic pacemaker 11/17   Advanced CKD IV/V --declined HD last admission   HTN   DM      Recommendations:    Echo pending/being performed today   Continue Asa, Toprol, PO Lasix, no ace/arb given CKD. His BPS are low as well--hold hydralazine   Nephrology consult for diuetic recs   Goals of care and palliative care discussions   Stable from CV POV    ADDENDUM:  Patient seen and examined, my assessment and plan as above. Lungs CTAB. Continue medical management of NSTEMI. Echo pending.     Encarnacion Chu, MD      Medications:      Scheduled Meds:    acetaminophen, 650 mg, Oral, 4 times per day  allopurinol, 100 mg, Oral, Daily  aspirin, 81 mg, Oral, Daily  atorvastatin, 40 mg, Oral,  QHS  cetirizine, 10 mg, Oral, Daily  furosemide, 40 mg, Oral, Daily  gabapentin, 100 mg, Oral, Q8H  insulin glargine, 10 Units, Subcutaneous, QHS  insulin lispro, 1-3 Units, Subcutaneous, QHS  insulin lispro, 1-5 Units, Subcutaneous, TID AC  lactobacillus/streptococcus, 1 capsule, Oral, Daily  lidocaine, 2 patch, Transdermal, Q24H  metoprolol succinate XL, 12.5 mg, Oral, Daily  pantoprazole, 40 mg, Oral, QAM AC  senna-docusate, 2 tablet, Oral, BID  sodium bicarbonate, 1,300 mg, Oral, Q12H [place]  sodium chloride, 1,000 mL, Intravenous, Once        Continuous Infusions:           Physical Exam:       VITAL SIGNS PHYSICAL EXAM   Vitals:    01/08/18 2329 01/09/18 0500 01/09/18 0527 01/09/18 0804   BP: 118/58  122/58 101/51   Pulse: 85  74 69   Resp: 18  18 18    Temp: 99.9 F (37.7 C)  98.5 F (36.9 C) 98.7 F (37.1 C)   TempSrc: Oral  Oral Oral   SpO2: 97%  96% 96%   Weight:  73 kg (161 lb)  Height:           Telemetry: Reviewed VP      Intake/Output Summary (Last 24 hours) at 01/09/2018 0831  Last data filed at 01/09/2018 0800  Gross per 24 hour   Intake 594 ml   Output 675 ml   Net -81 ml    Physical Exam  General: awake, alert, breathing comfortably, no acute distress  Head: normocephalic  Eyes: EOM's intact  Cardiovascular: regular rate and rhythm, normal S1, S2, no S3, no S4, no murmurs, rubs or gallops  Neck: no carotid bruits or JVD  Lungs: clear to auscultation bilateraly, without wheezing, rhonchi, or rales  Abdomen: soft, non-tender, non-distended; no palpable masses,  normoactive bowel sounds  Extremities: no edema  Pulse: equal pulses, 4/4 symmetric  Neurological: Alert and oriented X3, mood and affect normal  Musculoskeletal: normal strength and tone         Labs:     Recent Labs   Lab 01/08/18  0430 01/08/18  0115 01/07/18  2236   Troponin I 7.95* 9.91* 9.95*             Recent Labs   Lab 01/07/18  1829   Bilirubin, Total 1.0   Bilirubin, Direct 0.4   Protein, Total 6.9   Albumin 4.1   ALT 6   AST (SGOT)  41*         Recent Labs   Lab 01/08/18  0517 01/07/18  1829   PT  --  15.0   PT INR  --  1.2*   PTT 142* 46*     Recent Labs   Lab 01/09/18  0239 01/08/18  0115 01/07/18  1829   WBC 8.45 8.11 9.28   Hgb 9.1* 11.0* 11.7*   Hematocrit 27.8* 32.3* 35.7*   Platelets 105* 112* 123*     Recent Labs   Lab 01/09/18  0239 01/08/18  0115 01/07/18  1829   Sodium 134* 135* 135*   Potassium 3.8 4.5 4.9   Chloride 104 104 103   CO2 19* 17* 15*   BUN 47.0* 42.0* 38.0*   Creatinine 2.8* 3.0* 3.0*   EGFR 21.4 19.8 19.8   Glucose 172* 211* 236*   Calcium 8.1 9.4 9.6           Invalid input(s): FREET4    .  Lab Results   Component Value Date    BNP 4,529 (H) 01/07/2018        Weight Monitoring 12/06/2017 12/07/2017 12/08/2017 12/30/2017 01/07/2018 01/07/2018 01/09/2018   Height - - - - 157.5 cm 157.5 cm -   Height Method - - - - Stated Stated -   Weight 88.678 kg 80.559 kg 77.384 kg 72.122 kg 72.122 kg 72 kg 73.029 kg   Weight Method Bed Scale Standing Scale Standing Scale - - Bed Scale Bed Scale   BMI (calculated) - - - - 29.1 kg/m2 29.1 kg/m2 -       Imaging:       ____________________________________________    Signed by: Jeri Modena, [facility]  NP [place] (8am-5pm)  MD Spectralink [phone] or [phone] (8am-5pm)  Arrhythmia Spectralink [phone] (8am-4:30pm)  After hours, non urgent consult line [phone]  After Hours, urgent consults [phone]

## 2018-01-09 NOTE — Discharge Instr - AVS First Page (Addendum)
Reason for your Hospital Admission:  You were admitted for knee pain and possibly an injury to your heart.    For knee pain, we think this is related to your Gout that you have had for a while. For this we gave you pain medications (Tylenol, Gabapentin, Lidocaine patches) and started you on steroids (Prednisone). This helped with your pain. We had a rheumatologist (joint doctor) who recommends that you see one when you leave the hospital.    For the heart, we did an echocardiogram (an ultrasound of your heart) and this was fairly unchanged compared to the echocardiogram in April 2019. You declined further evaluation with a cardiac catherization (evaluation of the arteries of the heart).    Instructions for after your discharge:  Please continue taking the medications for knee pain as instructed.  -- Tylenol 650 every 6 hours for 3 days then stop.  -- Lidocaine 5% patches to knees every 24 hours for 3 days, then stop.  -- Prednisone (Steroids): Take 30mg  (3 tablets) tomorrow morning with food for 1 day, 20 mg (2 tablets) for the next 3 days, then 10mg  (1 tablet) for the next 3 days. Then stop. Last date is 01/17/2018.  -- Allopurinol 100mg  - take daily  -- Gabapentin 100mg  three times a day    For Blood pressure: we decreased your Metoprolol to 12.5mg  twice a day. We also stopped your Valsartan and Hydralazine because your blood pressure was low. You can follow up with your heart doctor or primary care doctor to resume these in the future.    Follow Up  Please follow up with your primary care doctor later this week. Please follow up with a rheumatologist (joint doctor), nephrologist (kidney doctor) and cardiologist (heart doctor) in 1-2 weeks.    If you have significantly worsening knee pain, please call your primary care doctor or the rheumatologist to make an appointment. If your knee pain gets worse and you have a fever of 100.4 or greater, please go the ED.  If you start developing chest pain or worsening  shortness of breath, please go to the emergency department (ED).    Home Health Discharge Information     Your doctor has ordered Skilled Nursing, Physical Therapy and Occupational Therapy in-home service(s) for you while you recuperate at home, to assist you in the transition from hospital to home.      The agency that you or your representative chose to provide the service:  Name of [place] Placement: [place]  [phone]    The above services were set up by:  Beverely Risen  ([place])   Phone [phone]    IF YOU HAVE NOT HEARD [place] 24-48 HOURS AFTER [place] FIRST VISIT. FOR ANY SCHEDULING CONCERNS OR QUESTIONS RELATED TO HOME HEALTH, SUCH AS TIME OR DATE PLEASE CONTACT YOUR HOME HEALTH AGENCY AT THE NUMBER LISTED ABOVE.

## 2018-01-09 NOTE — PT Eval Note (Signed)
[facility]   Physical Therapy Evaluation     Patient: Marco Bruce    MRN#: [id number]   Unit: [facility]  Bed: TG256/LS937-34    Discharge Recommendations:   Discharge Recommendation: SNF       DME Recommendation: TBD at SNF    If SNF recommended discharge disposition is not available, patient will need hands on assist for all transfers, (owns RW, wheelchair) equipment, and HHPT with increased assist for all transfers. Transport assist to enter the house.        Assessment:   Marco Bruce is a 84 y.o. male admitted 01/07/2018 with joint pains and NSTEMI. Patient limited to trials x 2 standing due to severe pain in b/l knees, unable to extend the knees to come to full standing. Squat pivot to [facility] and the chair with max.assist x 2.    Pt's functional mobility is impacted by:  decreased activity tolerance, decreased balance, decreased bed mobility, pain, ROM deficits, decreased strength and transfers .  There are a few comorbidities or other factors that affect plan of care and require modification of task including: assistive device needed for mobility, chronic pain and has stairs to manage.  Standardized tests and exams incorporated into evaluation include AMPAC mobility, balance, ROM  and Strength.  Pt demonstrates a evolving clinical presentation due to pain in all joints mainly b/l knees.  Pt would continue to benefit from PT to address these deficits and increase functional independence.     Therapy Diagnosis: Impaired functional mobility    Rehabilitation Potential: Good    Treatment Activities: PT Eval, standing trials, therex, safety with transfers  Educated the patient  And caregiver (his Marco Bruce, who is 84 yrs old) to role of physical therapy, plan of care, goals of therapy and HEP, safety with mobility and ADLs, pursed lip breathing, discharge instructions, home safety.    Plan:   PT Frequency: 3-4x/wk    Treatment/Interventions: bed mobility,transfers, gait training, therex,  safety    Risks/benefits/POC discussed with the patient       Precautions and Contraindications:   Falls  Arthritic pain  Language Khmer from [place]-Marco Bruce assist with translation    Consult received for Marco Bruce for PT Evaluation and Treatment.  Patient's medical condition is appropriate for Physical Therapy intervention at this time.    History of Present Illness:   Marco Bruce is a 84 y.o. male admitted on 01/07/2018 per H&P,"who presents to the hospital with significant pain from arthritis found to have NSTEMI.     Coughing since January but worse yesterday improved with mucinex. Joint pains since last night and stoped moving. Nausea and vomiting started this morning. Usually ambulates with a walker. No falls."         Medical Diagnosis: Acute on chronic systolic heart failure [K87.68]  Pacemaker [Z95.0]  CKD (chronic kidney disease), stage IV [N18.4]  NSTEMI (non-ST elevated myocardial infarction) [I21.4]  Vomiting, intractability of vomiting not specified, presence of nausea not specified, unspecified vomiting type [R11.10]    Past Medical/Surgical History:  HFrEF EF 40%, arthritis, CKD IV,CHF,Chronic gout,pacemaker  Imaging/Tests/Labs:    Knee 4+ Views Left   Impression:     1. No acute fracture identified.  2. Progressive tricompartment degenerative changes.  3. Joint effusion.   Knee 4+ Views Right   Impression:     1. No acute fracture.  2. Severe tricompartment degenerative changes.  3. Joint effusion.       Social History:information gathered from  Marco Bruce-patient's caregiver   Prior Level of Function: Assist with mobility and ADL'. Uses RW all the time.  Assistive Devices: RW  Baseline Activity: Household  DME Currently at Home: RW, wheelchair  Home Living Arrangements: Lives with Marco Bruce, son and daughter-in law. Marco Bruce is primary caregiver and assist the patient 24 hrs for 2 years now.  Type of Home: [facility]  Home Layout: 10 STE (b/l rails and family assist); bed/bath on same floor.    Subjective:"I am  in pain"   Patient is agreeable to participation in the therapy session. Family and/or guardian are agreeable to patient's participation in the therapy session. Nursing clears patient for therapy.     Patient Goal: None stated    Pain:   Scale: 10/10  Location: Arms and legs  Intervention: RN aware; rest    Objective:   Patient is in bed with IV access, tele in place. Vitals stable throughout the session.  Marco Bruce at bedside.      Cognitive Status and Neuro Exam:  Alert and able to follow commands. Oriented x 3.    Musculoskeletal Examination  RUE ROM: WFL; please see OT notes  LUE ROM: WFL; please see OT notes  RLE ROM: WFL; except knee ROM limited extension  LLE ROM: WFL; except knee ROM limited extension    Grossly  RUE Strength: See OT notes  LUE Strength: See OT notes  RLE Strength: 3/5  LLE Strength: 3/5    Functional Mobility  Rolling: NT  Supine to Sit: Mod.assist with HOB elevated; increased time and cues  Scooting: Mod.assist x 2  Sit to Stand: Max.assistx 2 (attempted x2, flexed knees, unable to come to full standing).Slightly better with Increased bed height. Increased cues and time for activity.   Stand to Sit: Max.assist x 2  Transfers: Max.assist x 2 for Squat pivot transfers to [facility] and then to Chair.        Ambulation  PMP - Progressive Mobility Protocol   PMP Activity: Step 5 - Chair    Balance  Static Sitting: Fair+  Dynamic Sitting: Fair  Static Standing: Poor with RW      Participation and Activity Tolerance  Participation Effort: Good  Endurance: Fair; limited by pain    AM-PACT Inpatient Short Forms  Inpatient AM-PACT Performed? (PT): Basic Mobility Inpatient Short Form  AM-PACT "6 Clicks" Basic Mobility Inpatient Short Form  Turning Over in Bed: A lot  Sitting Down On/Standing From Armchair: A lot  Lying on Back to Sitting on Side of Bed: A lot  Assist Moving to/from Bed to Chair: A lot  Assist to Walk in Hospital Room: Total  Assist to Climb 3-5 Steps with Railing: Total  PT Basic Mobility  Raw Score: 10  CMS 0-100% Score: 76.75%      Patient left with call bell within reach, all needs met, SCDs off as found, fall mat in place, chair alarm on and all questions answered. RN notified of session outcome and patient response. Spoke to RN and tech regarding increased assist with transfers. No hoyer lift on the floor.    Goals:  Goals  Goal Formulation: With patient/family  Time for Goal Acheivement: 5 visits  Goals: Select goal  Pt Will Go Supine To Sit: with contact guard assist  Pt Will Perform Sit To Supine: with contact guard assist  Pt Will Perform Sit to Stand: with contact guard assist, to maximize functional mobility and independence  Pt Will Demo Standing Balance: 2/5 indep, requires both UE support  Pt Will Transfer Bed/Chair: with rolling walker, with contact guard assist, to maximize functional mobility and independence  Pt Will Ambulate: 1-10 feet, with rolling walker, with contact guard assist, to maximize functional mobility and independence  Pt Will Perform Home Exer Program: independent    Marco Bruce  PT  Pager [place]    Time of Treatment  PT Received On: 01/09/18  Start Time: 1100  Stop Time: 1145  Time Calculation (min): 45 min

## 2018-01-10 ENCOUNTER — Encounter (INDEPENDENT_AMBULATORY_CARE_PROVIDER_SITE_OTHER): Payer: Self-pay | Admitting: Family Medicine

## 2018-01-10 DIAGNOSIS — M109 Gout, unspecified: Secondary | ICD-10-CM | POA: Diagnosis present

## 2018-01-10 LAB — CBC AND DIFFERENTIAL
Absolute NRBC: 0 10*3/uL (ref 0.00–0.00)
Basophils Absolute Automated: 0.01 10*3/uL (ref 0.00–0.08)
Basophils Automated: 0.2 %
Eosinophils Absolute Automated: 0 10*3/uL (ref 0.00–0.44)
Eosinophils Automated: 0 %
Hematocrit: 29.7 % — ABNORMAL LOW (ref 37.6–49.6)
Hgb: 9.8 g/dL — ABNORMAL LOW (ref 12.5–17.1)
Immature Granulocytes Absolute: 0.01 10*3/uL (ref 0.00–0.07)
Immature Granulocytes: 0.2 %
Lymphocytes Absolute Automated: 0.95 10*3/uL (ref 0.42–3.22)
Lymphocytes Automated: 21.2 %
MCH: 30.2 pg (ref 25.1–33.5)
MCHC: 33 g/dL (ref 31.5–35.8)
MCV: 91.7 fL (ref 78.0–96.0)
MPV: 11.1 fL (ref 8.9–12.5)
Monocytes Absolute Automated: 0.1 10*3/uL — ABNORMAL LOW (ref 0.21–0.85)
Monocytes: 2.2 %
Neutrophils Absolute: 3.42 10*3/uL (ref 1.10–6.33)
Neutrophils: 76.2 %
Nucleated RBC: 0 /100 WBC (ref 0.0–0.0)
Platelets: 121 10*3/uL — ABNORMAL LOW (ref 142–346)
RBC: 3.24 10*6/uL — ABNORMAL LOW (ref 4.20–5.90)
RDW: 14 % (ref 11–15)
WBC: 4.49 10*3/uL (ref 3.10–9.50)

## 2018-01-10 LAB — BASIC METABOLIC PANEL
BUN: 56 mg/dL — ABNORMAL HIGH (ref 9.0–28.0)
CO2: 20 mEq/L — ABNORMAL LOW (ref 22–29)
Calcium: 8.1 mg/dL (ref 7.9–10.2)
Chloride: 100 mEq/L (ref 100–111)
Creatinine: 2.8 mg/dL — ABNORMAL HIGH (ref 0.7–1.3)
Glucose: 211 mg/dL — ABNORMAL HIGH (ref 70–100)
Potassium: 4.4 mEq/L (ref 3.5–5.1)
Sodium: 133 mEq/L — ABNORMAL LOW (ref 136–145)

## 2018-01-10 LAB — GFR: EGFR: 21.4

## 2018-01-10 LAB — GLUCOSE WHOLE BLOOD - POCT
Whole Blood Glucose POCT: 197 mg/dL — ABNORMAL HIGH (ref 70–100)
Whole Blood Glucose POCT: 239 mg/dL — ABNORMAL HIGH (ref 70–100)

## 2018-01-10 MED ORDER — PREDNISONE 10 MG PO TABS
ORAL_TABLET | ORAL | 0 refills | Status: DC
Start: 2018-01-10 — End: 2018-04-09

## 2018-01-10 MED ORDER — ACETAMINOPHEN 325 MG PO TABS
650.00 mg | ORAL_TABLET | Freq: Four times a day (QID) | ORAL | Status: AC
Start: 2018-01-10 — End: 2018-01-13

## 2018-01-10 MED ORDER — METOPROLOL SUCCINATE ER 25 MG PO TB24
12.50 mg | ORAL_TABLET | Freq: Every day | ORAL | 1 refills | Status: DC
Start: 2018-01-11 — End: 2018-05-02

## 2018-01-10 MED ORDER — PREDNISONE 10 MG PO TABS
ORAL_TABLET | ORAL | 0 refills | Status: DC
Start: 2018-01-10 — End: 2018-01-10

## 2018-01-10 MED ORDER — LIDOCAINE 5 % EX PTCH
2.00 | MEDICATED_PATCH | CUTANEOUS | 0 refills | Status: DC
Start: 2018-01-11 — End: 2018-01-10

## 2018-01-10 MED ORDER — LIDOCAINE 5 % EX PTCH
2.00 | MEDICATED_PATCH | CUTANEOUS | 0 refills | Status: AC
Start: 2018-01-11 — End: 2018-01-14

## 2018-01-10 MED ORDER — METOPROLOL SUCCINATE ER 25 MG PO TB24
12.50 mg | ORAL_TABLET | Freq: Every day | ORAL | 1 refills | Status: DC
Start: 2018-01-11 — End: 2018-01-10

## 2018-01-10 MED ORDER — GABAPENTIN 100 MG PO CAPS
ORAL_CAPSULE | ORAL | 0 refills | Status: DC
Start: 2018-01-10 — End: 2018-02-01

## 2018-01-10 NOTE — Progress Notes (Signed)
PROGRESS NOTE    Date Time: 01/10/18 10:09 AM  Patient Name: [name]      Assessment:   Marco Bruce is a 84 y.o. male with PMH of HFrEF, OA, gout, CKD admitted with recurrent b/l knee pain/swelling and found to have NSTEMI    Plan:   1. B/l acute knee pain/swelling  -xrays with DJD and small effusions  -symptoms c/w previous gout flares, no fevers/chills and given b/l unlikely infectious  -improving with steroids, continue prednisone 30mg /day x 2 more days, 20mg /day x 3 days, 10mg /d x 3 days then stop  -cont allopurinol at current dose for now    2. Chronic gout/hyperuricemia  -cont allopurinol home dose of allopurinol 100mg /d  -uric acid not at goal, currently 9  -will eventually need increase dosing, but not during acute flare as can worsen flare  -low purine diet  -pt to f/up with outpt rheumatology for further management     3. NSTEMI/HF  -as per cardiology/primary team    4. CKD  -avoid nsaids/colchicine  -hyperuricemia can contribute to renal ds    5. High risk medication use  -with steroids, start ca/vitD for bone health  -with steroids start GI ppx    Pt to f/up with outpt rheumatology 1-2 weeks after discharge.  Pt lives in [place], prefers rheumatologist there: can see Dr. Jimmy Picket or Dr. Karren Burly.  Otherwise if needed can f/up with [facility]ology.    Discussed plan with pt, pts son     Subjective:   Pt reports improved knee symptoms, able to bear weight and ambulate now.     Medications:     Current Facility-Administered Medications   Medication Dose Route Frequency   . acetaminophen  650 mg Oral 4 times per day   . allopurinol  100 mg Oral Daily   . aspirin  81 mg Oral Daily   . atorvastatin  40 mg Oral QHS   . cetirizine  10 mg Oral Daily   . furosemide  40 mg Oral Daily   . gabapentin  100 mg Oral Q8H   . heparin (porcine)  5,000 Units Subcutaneous Q8H [place]   . insulin glargine  10 Units Subcutaneous QHS   . insulin lispro  1-3 Units Subcutaneous QHS   . insulin lispro  1-5 Units  Subcutaneous TID AC   . lactobacillus/streptococcus  1 capsule Oral Daily   . lidocaine  2 patch Transdermal Q24H   . metoprolol succinate XL  12.5 mg Oral Daily   . pantoprazole  40 mg Oral QAM AC   . predniSONE  30 mg Oral Daily   . senna-docusate  2 tablet Oral BID   . sodium bicarbonate  1,300 mg Oral Q12H [place]       Review of Systems:   All systems reviewed and negative except as per HPI   Physical Exam:     Vitals:    01/10/18 0833   BP: 118/61   Pulse: 71   Resp:    Temp:    SpO2:        Intake and Output Summary (Last 24 hours) at Date Time    Intake/Output Summary (Last 24 hours) at 01/10/2018 1009  Last data filed at 01/10/2018 0500  Gross per 24 hour   Intake 948 ml   Output 535 ml   Net 413 ml       General appearance - alert, well appearing, and in no distress  Mental status - alert, oriented to person, place, and  time  Musculoskeletal - no swelling/warmth or ttp of b/l knees  Extremities - peripheral pulses normal, no pedal edema, no clubbing or cyanosis  Skin - normal coloration and turgor, no rashes, no suspicious skin lesions noted    Labs:     Results     Procedure Component Value Units Date/Time    Glucose Whole Blood - POCT [[id number]]  (Abnormal) Collected:  01/10/18 0721     Updated:  01/10/18 0726     POCT - Glucose Whole blood 197 mg/dL     CBC with differential [[id number]]  (Abnormal) Collected:  01/10/18 0348    Specimen:  Blood Updated:  01/10/18 0608     WBC 4.49 x10 3/uL      Hgb 9.8 g/dL      Hematocrit 29.7 %      Platelets 121 x10 3/uL      RBC 3.24 x10 6/uL      MCV 91.7 fL      MCH 30.2 pg      MCHC 33.0 g/dL      RDW 14 %      MPV 11.1 fL      Neutrophils 76.2 %      Lymphocytes Automated 21.2 %      Monocytes 2.2 %      Eosinophils Automated 0.0 %      Basophils Automated 0.2 %      Immature Granulocyte 0.2 %      Nucleated RBC 0.0 /100 WBC      Neutrophils Absolute 3.42 x10 3/uL      Abs Lymph Automated 0.95 x10 3/uL      Abs Mono Automated 0.10 x10 3/uL      Abs Eos Automated 0.00  x10 3/uL      Absolute Baso Automated 0.01 x10 3/uL      Absolute Immature Granulocyte 0.01 x10 3/uL      Absolute NRBC 0.00 x10 3/uL     Basic Metabolic Panel [[id number]]  (Abnormal) Collected:  01/10/18 0348    Specimen:  Blood Updated:  01/10/18 0444     Glucose 211 mg/dL      BUN 56.0 mg/dL      Creatinine 2.8 mg/dL      Calcium 8.1 mg/dL      Sodium 133 mEq/L      Potassium 4.4 mEq/L      Chloride 100 mEq/L      CO2 20 mEq/L     GFR [[id number]] Collected:  01/10/18 0348     Updated:  01/10/18 0444     EGFR 21.4    Glucose Whole Blood - POCT [[id number]]  (Abnormal) Collected:  01/09/18 2052     Updated:  01/09/18 2112     POCT - Glucose Whole blood 172 mg/dL     Glucose Whole Blood - POCT [[id number]]  (Abnormal) Collected:  01/09/18 1538     Updated:  01/09/18 1548     POCT - Glucose Whole blood 132 mg/dL     Glucose Whole Blood - POCT [[id number]]  (Abnormal) Collected:  01/09/18 1146     Updated:  01/09/18 1155     POCT - Glucose Whole blood 177 mg/dL               Rads:   Radiological Procedure reviewed.    Signed by: Sherin Quarry

## 2018-01-10 NOTE — Progress Notes (Signed)
CM met with pt and grandson at bedside to discuss PT/OT rec for SNF. Pt lives with his daughter and his grandson Georgina Snell who is his home health aide through [place]. [place] states,pt/family are not interested in SNF due to a bad experience in the past .Pt/family would rather go home with [facility] (SN/PT/OT). Georgina Snell states pt has been with [facility] home [facility] in the past and would like to go back with them. Home health [facility] notified. Family advised to f/u with [facility] clinic as recommended in past to prevent/decrease readmission rate ( pt is [facility] has been following pt, but family will not pick up clinic's calls). CM will continue to follow.    Arnold Long, RN, BSN  RN Case Manager  Critical Care Unit  [phone]

## 2018-01-10 NOTE — Discharge Instructions (Signed)
Treating Gout Attacks     Raising the joint above the level of your heart can help reduce gout symptoms.     Gout is a disease that affects the joints. It is caused by excess uric acid in your blood that may lead to crystals forming in your joints. Left untreated, it can lead to painful foot and joint deformities and even kidney problems. But, by treating gout early, you can relieve pain and help prevent future problems. Gout can usually be treated with medicine and proper diet. In severe cases, surgery may be needed.  Gout attacks are painful and often happen more than once. Taking medicines may reduce pain and prevent attacks in the future. There are also some things you can do at home to relieve symptoms.  Medicines for gout  Your healthcare provider may prescribe a daily medicine to reduce levels of uric acid. Reducing your uric acid levels may help prevent gout attacks. Allopurinol is one commonly used medicine takendaily to reduce uric acid levels. Other daily medicines used to reduce uric acid levels include febuxostat, lesinurad, and probencid. Other medicines can help relieve pain and swelling during an acuteattack.Medicines such as NSAIDs (nonsteroidal anti-inflammatory medicines), steroids, and colchicine may be prescribed for intermittent use to relieve an acute gout attack. Be sure to take your medicine as directed.  What you can do  Below are some things you can do at home to relieve gout symptoms. Your healthcare provider may have other tips.   Rest the painful joint as much as you can.   Raise the painful joint so it is at a level higher than your heart.   Use ice for 10 minutes every 1 to 2 hours as possible.  How can I prevent gout?  With a little effort, you may be able to prevent gout attacks in the future. Here are some things you can do:   Don't eat foods high in purines  ? Certain meats (red meat, processed meat, turkey)  ? Organ meats (kidney, liver, sweetbread)  ? Shellfish  (lobster, crab, shrimp, scallop, mussel)  ? Certain fish (anchovy, sardine, herring, mackerel)   Take any medicines prescribed by your healthcare provider.   Lose weight if you need to.   Reduce high fructose corn syrup in meals and drinks.   Reduce or cut out alcohol, particularly beer, but also red wine and spirits.   Control blood pressure, diabetes, and cholesterol.   Drink plenty of water to help flush uric acid from your body.  Date Last Reviewed: 07/04/2016   2000-2019 [employer] [address], [place] [address]. All rights reserved. This information is not intended as a substitute for professional medical care. Always follow your healthcare professional's instructions.

## 2018-01-10 NOTE — PT Progress Note (Signed)
[facility]   Physical Therapy Treatment  Patient:  Marco Bruce MRN#:  [id number]  Unit: [facility]  Bed: MA263/FH545-62    Discharge Recommendations:   D/C Recommendations: Home with home health PT(prior level of assist)   DME Recommendations: in place      Assessment:   Patient progressing with therapy, able to ambulate increased distance with less pain and also did stairs. Patient c/o some tightness and mild pain around left knee . Patient presents with decreased endurance, strength,balance affecting functional mobility. Patient would benefit from continued therapy to maximize functional independence with mobility and safety.    Treatment Activities: bed mobility,transfers, gait training, stairs training, therex    Educated the patient to role of physical therapy, plan of care, goals of therapy and HEP, safety with mobility and ADLs, pursed lip breathing, discharge instructions, home safety.    Plan:   PT Frequency: 2-3x/wk    Continue plan of care.       Precautions and Contraindications:   Falls  Language Khmer from [place]-Grandson assist with translation    Updated Medical Status/Imaging/Labs: Reviewed    Subjective:    "I am feeling much better"  Patient's medical condition is appropriate for Physical Therapy intervention at this time.  Patient is agreeable to participation in the therapy session. Nursing clears patient for therapy.    Pain:   Scale:3/10  Location:Right knee  Intervention:Rest      Objective:   Patient is in bed with IV access,tele in place. Grandson at bedside.    Cognition  Alert and Oriented x 4. Able to follow commands.     Functional Mobility  Rolling: Independent  Supine to Sit: SBA  Scooting: SBA  Sit to Stand: SBA  Stand to Sit: SBA  Transfers: SBA with RW    Ambulation  PMP - Progressive Mobility Protocol   PMP Activity: Step 7 - Walks out of Room  Distance Walked (ft) (Step 6,7): 150 Feet   Level of Assistance required: SBA  Pattern: small steps,  decreased cadence, slightly flexed posture  Device Used: RW  Weightbearing Status: FWB BLE  Stair Management: 2 steps with b/l rails; CGA;step to pattern          Balance  Static Sitting: Good  Dynamic Sitting: Good  Static Standing: Good with RW  Dynamic Standing: Fair+w/RW    Therapeutic Exercises  AP's<LAQ's<hip flexion<UE shoulder flexion:10 reps    Patient Participation: Good  Patient Endurance: Good    Patient left with call bell within reach, all needs met, SCDs off as found, fall mat in place, bed alarm on(wants to sit on side of bed, Grand son at bedside), RN aware and all questions answered. RN notified of session outcome and patient response.     Goals:  Goals  Goal Formulation: With patient/family  Time for Goal Acheivement: 5 visits  Goals: Select goal  Pt Will Go Supine To Sit: Goal met  Pt Will Perform Sit To Supine: Goal met  Pt Will Perform Sit to Stand: Goal met  Pt Will Demo Standing Balance: Goal met  Pt Will Transfer Bed/Chair: Goal met(new Goal:Supervision with transfers)  Pt Will Ambulate: Goal met(New Goal:Supervision/Mod I with RW >200 feet)  Pt Will Go Up / Down Stairs: 3-5 stairs, with supervision, with stand by assist  Pt Will Perform Home Exer Program: independent      Gasper Sells  PT  Pager Number:[phone]    Time of Treatment:  PT Received On:  01/10/18  Start Time: 1140  Stop Time: 1203  Time Calculation (min): 23 min  Treatment # 1 out of 5 visits

## 2018-01-10 NOTE — Plan of Care (Addendum)
Patient A/Ox3-4, [place] speaking, family at bedside to translate, waiver signed and in chart. VS as per flow sheet, NSR with BBB and V paced on tele, HR 70s-90s. Patient denies chest pain/discomfort or SOB, remains on RA throughout shift. Pt reports pain to bilateral knees, relieved with scheduled Tylenol, gabapentin and lidocaine patches. All needs addressed, OOB with max assist to bedside commode/chair. Pt had 1 large loose BM this shift. Bed alarm and floor mat in place. Call bell and family at bedside.      Plan: Continue to monitor/manage pain. Continue Prednisone per Rheumatology. Per MD note, may benefit from palliative care. Per cardiology note, Nephrology consult?? Lasix 40 mg PO daily.

## 2018-01-10 NOTE — Progress Notes (Signed)
RN Navigator note:  Patient is a 84 year old male admitted for NSTEMI / and vomiting. Not a candidate for cardiac catheterization as patient is declining hemodialysis, so will be medically managed per cardiology.    Heart Failure folder provided and reviewed the following information with grandson , Tommi Rumps, as patient speaks mainly [place] language and declined interpreter:  CHF warning zones reviewed: Yes  Daily weights:Yes  Strict I/Os:Yes  Fluid restriction: Yes  Heart healthy diet:Yes , handout provided    ACE/ARB reviewed: None , due to CKD  Beta blocker reviewed: Yes  Echo/EF: 35-40%  Cardiac rehab: Brochure provided and discussed, declined at this time.  90 days commitment to care handout: Yes  Follow up Cardiology appt: [facility] to follow up with patient    AMI folder provided and reviewed the following information:  Antiplatelet therapy reviewed: Yes  ACE/ARB reviewed:None, due to CKD  Beta blocker reviewed:Yes  Statin:Yes  Echo/EF:35-40%  Cardiac rehab: Declined at this time  90 Commitment to Care: Yes  Follow up Cardiology appt:[facility] to follow up with patient  Heart healthy diet: Yes   Verbalized understanding, has no further questions at this time.

## 2018-01-10 NOTE — Plan of Care (Addendum)
Pt understood the discharge and medication instructions. Pt go home w/son and all belongings. Pt will pick the discharge meds up any pharmacy. Pt doesn't need a cardiac rehab per MD [facility].    Dual signed AVS reviewed, discharge and medication teaching: Completed  All questions answered: yes  Care plan goals completed: yes  Education resolved: yes  IV out, tele monitor removed: yes  Home medications delivered back to patient: N/A   New prescriptions filled or pick up location verified: Yes   Flu shot documentation complete: yes  Safely transported to: Wheelchair  Patient left with all belongings, medications, and discharge paperwork: yes      Problem: Moderate/High Fall Risk Score >5  Goal: Patient will remain free of falls  Outcome: Completed     Problem: Safety  Goal: Patient will be free from injury during hospitalization  Outcome: Completed  Goal: Patient will be free from infection during hospitalization  Outcome: Completed     Problem: Pain  Goal: Pain at adequate level as identified by patient  Outcome: Completed     Problem: Side Effects from Pain Analgesia  Goal: Patient will experience minimal side effects of analgesic therapy  Outcome: Completed     Problem: Discharge Barriers  Goal: Patient will be discharged home or other facility with appropriate resources  Outcome: Completed     Problem: Psychosocial and Spiritual Needs  Goal: Demonstrates ability to cope with hospitalization/illness  Outcome: Completed     Problem: Compromised Tissue integrity  Goal: Damaged tissue is healing and protected  Outcome: Completed  Goal: Nutritional status is improving  Outcome: Completed     Problem: Impaired Mobility  Goal: Mobility/Activity is maintained at optimal level for patient  Outcome: Completed     Problem: Hemodynamic Status: Cardiac  Goal: Stable vital signs and fluid balance  Outcome: Completed

## 2018-01-10 NOTE — Progress Notes (Signed)
Home Health Referral          Referral from Arnold Long (Case Manager) for home health care upon discharge.    By [employer], the patient has the right to freely choose a home care provider.  Arrangements have been made with:     A company of the patients choosing. We have supplied the patient with a listing of providers in your area who asked to be included and participate in Medicare.   [facility], [facility], a home care agency that provides adult home care services and participates in Medicare   The preferred provider of your insurance company. Choosing a home care provider other than your insurance company's preferred provider may affect your insurance coverage.      Home Health Discharge Information     Your doctor has ordered Skilled Nursing, Physical Therapy and Occupational Therapy in-home service(s) for you while you recuperate at home, to assist you in the transition from hospital to home.      The agency that you or your representative chose to provide the service:  Name of [facility]: [facility]  [phone]    The above services were set up by:  Beverely Risen  ([place])   Phone [phone]    IF YOU HAVE NOT HEARD [place] 24-48 HOURS AFTER [place] FIRST VISIT. FOR ANY SCHEDULING CONCERNS OR QUESTIONS RELATED TO HOME HEALTH, SUCH AS TIME OR DATE PLEASE CONTACT YOUR HOME HEALTH AGENCY AT THE NUMBER LISTED ABOVE.    HOME HEALTH REFERRAL    PATIENT"S DEMOGRAPHICS:        Name: Marco Bruce    Discharge Address: [address]  [place] [address]      Primary Telephone Number: [phone], land line  Secondary Telephone Number: [phone], grandson's cell  Emergency Contact and Number: Extended Emergency Contact Information  Primary Emergency Contact: [name] [place] Phone: [phone]  Work Phone: [phone]  Mobile  Phone: [phone]  Relation: Daughter  Secondary Emergency Contact: [name] [place] Phone: [phone]  Relation: [place]        Ordering Physician: Gean Maidens, MD    Following Physician: Danella Sensing, MD    PCP: Danella Sensing, MD, [phone]    Agreeable to Follow: Yes  Date/Time of Call: 01/10/18 @ 1120    Language/Communication Barrier:  Yes, speak [place]    Primary Diagnosis and Reason for Services:    Marco Bruce is a 84 y.o. male who presents to the hospital with significant pain from arthritis found to have NSTEMI.     Additional comments:  Declining SNF      Discharge Date: 01/10/18  Referral Source (PACC/Hospital/Unit): Beverely Risen, RN  Referral Date: 01/10/18     Home Health face-to-face (FTF) Encounter (Order [id number])   Consult   Date: 01/10/2018 Department[facility] Ordering/Authorizing: Gean Maidens, MD   Order Information     Order Date/Time Release Date/Time Start Date/Time End Date/Time   01/10/18 02:31 PM None 01/10/18 02:25 PM 01/10/18 02:25 PM   Order Details     Frequency Duration Priority Order Class   Once 1 occurrence Routine Hospital Performed   Standing Order Information     Remaining Occurrences Interval Last Released      0/1  Once 01/10/2018            Provider Information     Ordering User Ordering Provider Authorizing Provider   Beverely Risen, RN Gean Maidens, MD Gean Maidens, MD   Attending Provider(s) Admitting Provider PCP   Molly Maduro, MD; Jerrye Beavers, MD; Archie Patten, MD; Gean Maidens, MD Jerrye Beavers, MD Danella Sensing, MD   Verbal Order Info     Action Created on Order Mode Entered by Responsible Provider Signed by Signed on   Ordering 01/10/18 1431 Telephone with Darliss Ridgel, RN Gean Maidens, MD     Comments     Home nursing required for skilled assessment including cardiopulmonary assessment and dietary  education for disease management, and medication instruction. Home PT/OT required for gait and balance training, strengthening, mobility, fall prevention, and ADL training.     Diagnosis:    Marco Bruce is a 84 y.o. male who presents to the hospital with significant pain from arthritis found to have NSTEMI.         Order Questions     Question Answer Comment   Date of face-to-face (FTF) encounter: 01/10/2018    Medical conditions that necessitate Home Health care: B. Functional impairment due to recent hospitalization/procedure/treatment     D. Chronic illness & risk for re-hospitalization due to unstable disease status     E. Exacerbation of disease requiring follow up monitoring     H. Multiple new medications requiring management and monitoring    Clinical findings that support the need for Skilled Nursing. SN will: C. Monitor for signs and symptoms of exacerbation of disease and management     D. Review medication reconciliation, manage and educate on use and side effects     H. Assess cardiopulmonary status and monitor for signs &symptoms of exacerbation     I. Educate dietary and or fluid restrictions and weight management    Clinical findings that support the need for Physical Therapy. PT will A. Evaluate and treat functional impairment and improve mobility     C. Educate on weight bearing status, stair/gait training, balance & coordination     E. Educate on functional mobility; bed, chair, sit, stand and transfer activities     F. Perform home safety assessment & develop safe in home exercise program     H. Educate on the safe use of assistive device/ durable medical equipment    Clinical findings support the need for OT (needs SN/PT order).OT will A. Develop in home program to improve ability to perform ADLs     C. Educate on recovery and maintenance skills     E. Educate on basic motor function and reasoning abilities    Clinical findings that support the need for SLP. ST will F. N/A    Per  clinical findings, following services are medically necessary: Skilled Nursing     PT     OT    Evidence this patient is homebound because: C. Decreased endurance, strength, ROM, cadence, safety/judgment during mobility     F. Deconditioned due to advance disease process requiring assistance to leave home     G. Fall risk due to impaired coordination, gait and decreased balance     J. Advanced age with frailty factors affecting safe ambulation & needs supervision     L. Transfer/ambulation requires stand by assist and verbal cues to perform safely     M. Unsteady gait, poor ambulation with history of falls  N. Impaired mobility d/t pain, arthritis, weakness that compromises patient safety    Other (please specify) Dr. Ernest Mallick to follow in community          Process Instructions     Please select [place] medically necessary.     Based on the above findings, I certify that this patient is confined to the home and needs intermittent skilled nursing care, physical therapry and / or speech therapy or continues to need occupational therapy. The patient is under my care, and I have initiated the establishment of the plan of care. This patient will be followed by a physician who will periodically review the plan of care.    Collection Information     Consult Order Info     ID Description Priority Start Date Start Time   [id number] [place] face-to-face (FTF) Encounter Routine 01/10/2018 2:25 PM   Provider Specialty Referred to   ______________________________________ _____________________________________   Acknowledgement Info     For At Acknowledged By Acknowledged On   Placing Order 01/10/18 1431 Beverely Risen, RN 01/10/18 1431   Verbal Order Info     Action Created on Order Mode Entered by Responsible Provider Signed by Signed on   Ordering 01/10/18 1431 Telephone with Darliss Ridgel, RN Gean Maidens, MD     Patient Information     Patient Name  Maleki, Hippe Sex  Male  DOB  [birth year]   Additional Information     Associated Reports External References   Priority and Order Details [facility]

## 2018-01-10 NOTE — Progress Notes (Signed)
This note also relates to the following rows which could not be included:  LACE Score for RWB Report - Cannot attach notes to read only rows       01/10/18 1330   Discharge Disposition   Patient preference/choice provided? Yes   Physical Discharge Disposition Home, Home Health   Name of [place]   Mode of Transportation Car  (Private/family)   Patient/Family/POA notified of transfer plan Yes   Patient agreeable to discharge plan/expected d/c date? Yes   Family/POA agreeable to discharge plan/expected d/c date? Yes   Bedside nurse notified of transport plan? Yes   CM Interventions   Follow up appointment scheduled? Yes   Referral made for home health RN visit? Yes   Multidisciplinary rounds/family meeting before d/c? Yes   Medicare Checklist   Is this a Medicare patient? Yes   Patient received 1st IMM Letter? Yes   If LOS 3 days or greater, did patient received 2nd IMM Letter? n/a     Arnold Long, RN, BSN  RN Case Manager  Critical Care Unit  [phone]

## 2018-01-10 NOTE — Progress Notes (Addendum)
[facility]      Date Time: 01/10/18 9:52 AM  Patient Name: [facility]  Medical Record #:  [id number]  Account#:  [id number]  Admission Date:  01/07/2018         Patient Active Problem List   Diagnosis   . CKD (chronic kidney disease), stage IV   . Renovascular hypertension   . NSTEMI (non-ST elevated myocardial infarction)   . Hyponatremia   . Essential hypertension   . Acute on chronic diastolic congestive heart failure   . Hypokalemia   . Mixed hyperlipidemia   . Type 2 diabetes mellitus with complication, with long-term current use of insulin   . Acute gout of right knee   . Acute renal failure superimposed on stage 3 chronic kidney disease   . Acute on chronic systolic heart failure   . Acute bronchitis due to other specified organisms   . Pacemaker   . Bronchitis   . Diaphragm paralysis   . Vomiting       Subjective:   Denies chest pain, SOB or palpitations.      Assessment:    Admitted with weakness, N/V. Arthritis pain, possible gout   Elevated sed rate and CRP   Elevated BNP   Troponin 10 in setting of CKD with Cr 3--likely demand process with CHF with underlying CKD. Not a candidate for cath given CKD and risk for CRRT    Stage C systolic HF (LVEF 29% with moderate MR)   Repeat echo here with EF the same 35-40%   Biotronic pacemaker 11/17   Advanced CKD IV/V --declined HD last admission   HTN   DM      Recommendations:    Echo stable with EF essentially the same as on the echo in April of this year   Continue Asa, Toprol, PO Lasix, no ace/arb given CKD. His BPS are low  To normal as well--hold hydralazine   Will sign off and arrange followup  PT seen and examined. CP free. Denies any CP os SOB. Not a great candidate for LHC. Appears to be euvolemic.  Continue the current cardiac meds. Will arrange for a follow up.   Please call with questions. Can get [place] from cardiac point of view.     Medications:      Scheduled Meds:    acetaminophen, 650 mg, Oral, 4 times  per day  allopurinol, 100 mg, Oral, Daily  aspirin, 81 mg, Oral, Daily  atorvastatin, 40 mg, Oral, QHS  cetirizine, 10 mg, Oral, Daily  furosemide, 40 mg, Oral, Daily  gabapentin, 100 mg, Oral, Q8H  heparin (porcine), 5,000 Units, Subcutaneous, Q8H SCH  insulin glargine, 10 Units, Subcutaneous, QHS  insulin lispro, 1-3 Units, Subcutaneous, QHS  insulin lispro, 1-5 Units, Subcutaneous, TID AC  lactobacillus/streptococcus, 1 capsule, Oral, Daily  lidocaine, 2 patch, Transdermal, Q24H  metoprolol succinate XL, 12.5 mg, Oral, Daily  pantoprazole, 40 mg, Oral, QAM AC  predniSONE, 30 mg, Oral, Daily  senna-docusate, 2 tablet, Oral, BID  sodium bicarbonate, 1,300 mg, Oral, Q12H SCH        Continuous Infusions:           Physical Exam:       VITAL SIGNS PHYSICAL EXAM   Vitals:    01/09/18 2300 01/10/18 0346 01/10/18 0400 01/10/18 0833   BP: 125/58 120/59  118/61   Pulse: 69 75  71   Resp: 20 20     Temp: 98.4 F (36.9 C) 97.7  F (36.5 C)     TempSrc: Axillary Axillary     SpO2: 95% 97%     Weight:   71.7 kg (158 lb)    Height:           Telemetry: Reviewed VP, occ pvc      Intake/Output Summary (Last 24 hours) at 01/10/2018 0952  Last data filed at 01/10/2018 0500  Gross per 24 hour   Intake 948 ml   Output 535 ml   Net 413 ml    Physical Exam  General: awake, alert, breathing comfortably, no acute distress  Head: normocephalic  Eyes: EOM's intact  Cardiovascular: regular rate and rhythm, normal S1, S2, no S3, no S4, no murmurs, rubs or gallops  Neck: no carotid bruits or JVD  Lungs: clear to auscultation bilateraly, without wheezing, rhonchi, or rales  Abdomen: soft, non-tender, non-distended; no palpable masses,  normoactive bowel sounds  Extremities: no edema  Pulse: equal pulses, 4/4 symmetric  Neurological: Alert and oriented X3, mood and affect normal  Musculoskeletal: normal strength and tone         Labs:     Recent Labs   Lab 01/08/18  0430 01/08/18  0115 01/07/18  2236   Troponin I 7.95* 9.91* 9.95*              Recent Labs   Lab 01/07/18  1829   Bilirubin, Total 1.0   Bilirubin, Direct 0.4   Protein, Total 6.9   Albumin 4.1   ALT 6   AST (SGOT) 41*         Recent Labs   Lab 01/08/18  0517 01/07/18  1829   PT  --  15.0   PT INR  --  1.2*   PTT 142* 46*     Recent Labs   Lab 01/10/18  0348 01/09/18  0239 01/08/18  0115   WBC 4.49 8.45 8.11   Hgb 9.8* 9.1* 11.0*   Hematocrit 29.7* 27.8* 32.3*   Platelets 121* 105* 112*     Recent Labs   Lab 01/10/18  0348 01/09/18  0239 01/08/18  0115   Sodium 133* 134* 135*   Potassium 4.4 3.8 4.5   Chloride 100 104 104   CO2 20* 19* 17*   BUN 56.0* 47.0* 42.0*   Creatinine 2.8* 2.8* 3.0*   EGFR 21.4 21.4 19.8   Glucose 211* 172* 211*   Calcium 8.1 8.1 9.4           Invalid input(s): FREET4    .  Lab Results   Component Value Date    BNP 4,529 (H) 01/07/2018        Weight Monitoring 12/07/2017 12/08/2017 12/30/2017 01/07/2018 01/07/2018 01/09/2018 01/10/2018   Height - - - 157.5 cm 157.5 cm - -   Height Method - - - Stated Stated - -   Weight 80.559 kg 77.384 kg 72.122 kg 72.122 kg 72 kg 73.029 kg 71.668 kg   Weight Method Standing Scale Standing Scale - - Bed Scale Bed Scale Bed Scale   BMI (calculated) - - - 29.1 kg/m2 29.1 kg/m2 - -       Imaging:       ____________________________________________    Signed by: Jeri Modena, [facility]  NP [place] (8am-5pm)  MD Spectralink [phone] or [phone] (8am-5pm)  Arrhythmia Spectralink [phone] (8am-4:30pm)  After hours, non urgent consult line [phone]  After Hours, urgent consults [phone]

## 2018-01-11 ENCOUNTER — Telehealth: Payer: Self-pay

## 2018-01-11 NOTE — Telephone Encounter (Signed)
Discharge phone call completed.  Talked to : Tommi Rumps (grandson)  States that his grandfather is doing fine. No intervention done as patient had declined hemodialysis.    BP & HR:  SBP 130's and HR 60's  Chest pain: No  Shortness of breath:No  Aspirin:Yes  Follow up Cardiology appt: [facility] to schedule (needs to be seen in one week)  Follow up PCP appt:Yes  Has no further questions/ issues/ or concerns.

## 2018-01-11 NOTE — Progress Notes (Signed)
Transitional Care Management    Writer contacted patient, daughter, Nachum Derossett, [phone], and left a voicemail for follow up at [phone].    Gorden Harms, MSW, LCSW, ACM-SW  Social Worker Case Freight forwarder II  [facility]  [phone]

## 2018-01-12 ENCOUNTER — Ambulatory Visit (INDEPENDENT_AMBULATORY_CARE_PROVIDER_SITE_OTHER): Payer: Medicare Other | Admitting: Family Medicine

## 2018-01-12 ENCOUNTER — Encounter (INDEPENDENT_AMBULATORY_CARE_PROVIDER_SITE_OTHER): Payer: Self-pay | Admitting: Family Medicine

## 2018-01-12 VITALS — BP 134/67 | HR 68 | Temp 97.7°F

## 2018-01-12 DIAGNOSIS — M109 Gout, unspecified: Secondary | ICD-10-CM

## 2018-01-12 DIAGNOSIS — N184 Chronic kidney disease, stage 4 (severe): Secondary | ICD-10-CM

## 2018-01-12 DIAGNOSIS — I10 Essential (primary) hypertension: Secondary | ICD-10-CM

## 2018-01-12 DIAGNOSIS — Z23 Encounter for immunization: Secondary | ICD-10-CM

## 2018-01-12 DIAGNOSIS — E118 Type 2 diabetes mellitus with unspecified complications: Secondary | ICD-10-CM

## 2018-01-12 DIAGNOSIS — Z794 Long term (current) use of insulin: Secondary | ICD-10-CM

## 2018-01-12 NOTE — Progress Notes (Signed)
Have you seen any specialists/other providers since your last visit with [place]?    Yes- hospital visit    Arm preference verified?   Yes    The patient is due for eye exam, foot exam, shingles vaccine and AWV  Subjective:      Date: 01/12/2018 2:13 PM   Patient ID: Marco Bruce is a 84 y.o. male.    Chief Complaint:  Chief Complaint   Patient presents with   . URI     patient is improving according to grandson       HPI:  84 yr old male with DM , CHF , multiple med problem in for URI fu    In with Grandson - Marco Bruce who translated entire visit  Patient states that he was using over-the-counter cough medication and was not better.  Was seen in our walk-in clinic.  Gradual worsening of the shortness of breath and was admitted to the hospital  During his stay at the hospital.  He ended up having chest pain, and was diagnosed with the non-ST elevated MI.  Also was having a flareup on the ground on his right knee and left knee.  Patient was started on prednisone     DM - checking sugar and runs in the 120  Today FBS 150  No hypoglycemia episode  On Lantus at 20 unit at bedtime  Not on meal time insulin  Last eye exam - last year     Has CHD - dias dysfunction  Sees cardio q 3 months to check the pacemaker-seen them last month  Feels his weight has gone up - 3 to 4 pound sin 1 week  No change in breathing  No chest pain, no orthopnea, No PND         Problem List:  Patient Active Problem List   Diagnosis   . CKD (chronic kidney disease), stage IV   . Renovascular hypertension   . NSTEMI (non-ST elevated myocardial infarction)   . Hyponatremia   . Essential hypertension   . Acute on chronic diastolic congestive heart failure   . Hypokalemia   . Mixed hyperlipidemia   . Type 2 diabetes mellitus with complication, with long-term current use of insulin   . Acute gout of right knee   . Acute renal failure superimposed on stage 3 chronic kidney disease   . Acute on chronic systolic heart failure   . Acute bronchitis due to other  specified organisms   . Pacemaker   . Bronchitis   . Diaphragm paralysis   . Vomiting   . Gout of right knee   . Gout of left knee       Current Medications:  Current Outpatient Medications   Medication Sig Dispense Refill   . albuterol-ipratropium (DUO-NEB) 2.5-0.5(3) mg/3 mL nebulizer Take 3 mLs by nebulization 2 (two) times daily as needed (SOB) 3 mL 0   . allopurinol (ZYLOPRIM) 100 MG tablet Take 1 tablet (100 mg total) by mouth daily 90 tablet 3   . aspirin 81 MG chewable tablet Chew 1 tablet (81 mg total) by mouth daily     . atorvastatin (LIPITOR) 40 MG tablet Take 1 tablet (40 mg total) by mouth nightly     . cetirizine (ZYRTEC) 10 MG tablet Take 1 tablet (10 mg total) by mouth daily as needed for Rhinitis.     Marland Kitchen dextromethorphan-guaiFENesin (MUCINEX DM) 30-600 MG per 12 hr tablet Take 1 tablet by mouth 2 (two) times daily as  needed (cough)     . furosemide (LASIX) 40 MG tablet Take 1 tablet (40 mg total) by mouth daily     . gabapentin (NEURONTIN) 100 MG capsule TAKE ONE CAPSULE BY MOUTH EVERY 8 HOURS 90 capsule 0   . insulin glargine (LANTUS SOLOSTAR) 100 UNIT/ML injection pen Inject 10 Units into the skin nightly 9 mL 3   . linaGLIPtin 5 MG Tab Take 1 tablet (5 mg total) by mouth daily 90 tablet 0   . metoprolol succinate XL (TOPROL-XL) 25 MG 24 hr tablet Take 0.5 tablets (12.5 mg total) by mouth daily 30 tablet 1   . pantoprazole (PROTONIX) 40 MG tablet Take 40 mg by mouth nightly     . predniSONE (DELTASONE) 10 MG tablet Take 30mg  01/11/2018 morning with food (3 tabs), 20mg  for 3 more days (2 tabs), then 10mg  for 3 more days (1 tab) 12 tablet 0   . Probiotic Product (RISAQUAD PO) Take 1 capsule by mouth daily.     Marland Kitchen senna-docusate (PERICOLACE) 8.6-50 MG per tablet Take 2 tablets by mouth 2 (two) times daily     . sodium bicarbonate 650 MG tablet Take 2 tablets (1,300 mg total) by mouth 2 (two) times daily 60 tablet 0     No current facility-administered medications for this visit.        Allergies:  No  Known Allergies    Past Medical History:  Past Medical History:   Diagnosis Date   . Acute diastolic heart failure 67/59/1638   . Acute on chronic diastolic congestive heart failure 08/13/2016   . Acute pulmonary edema 05/26/2015   . ARF (acute renal failure) 05/26/2015   . Chronic diastolic heart failure 46/65/9935   . Chronic gout    . Chronic pain syndrome 07/07/2015   . CKD (chronic kidney disease) stage 4, GFR 15-29 ml/min     2018 baseline creatinine in EPIC is 1.5-2.3.   . Congestive heart failure (CHF)     EF 40% 5/18 echo, on lasix   . Current chronic use of systemic steroids     For Polymyalgia rheumatica   . Essential hypertension    . Mixed hyperlipidemia    . NSTEMI (non-ST elevated myocardial infarction) 08/13/2016   . Osteoarthritis of both knees    . Peripheral edema 06/16/2015   . Pneumonia due to infectious organism, unspecified laterality, unspecified part of lung 06/25/2015   . Polymyalgia rheumatica     chronic steroids   . 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 Second degree heart block by electrocardiogram (ECG) 04/27/2015   . Type 2 diabetes mellitus with complication, with long-term current use of insulin     12/01/2016 Hemoglobin A1c 8.2%       Past Surgical History:  Past Surgical History:   Procedure Laterality Date   . ABDOMINAL SURGERY     . CHOLECYSTECTOMY     . PACEMAKER Left 2017       Family History:  Family History   Problem Relation Age of Onset   . Hypertension Father        Social History:  Social History     Socioeconomic History   . Marital status: Widowed     Spouse name: Not on file   . Number of children: Not on file   . Years of education: Not on file   . Highest education level: Not on file   Occupational History   . Not on file   Social Needs   .  Financial resource strain: Not on file   . Food insecurity:     Worry: Not on file     Inability: Not on file   . Transportation needs:     Medical: Not on file     Non-medical: Not on file   Tobacco Use   . Smoking status: Never  Smoker   . Smokeless tobacco: Never Used   Substance and Sexual Activity   . Alcohol use: No     Comment: former alcoholic   . Drug use: No   . Sexual activity: Not on file   Lifestyle   . Physical activity:     Days per week: Not on file     Minutes per session: Not on file   . Stress: Not on file   Relationships   . Social connections:     Talks on phone: Not on file     Gets together: Not on file     Attends religious service: Not on file     Active member of club or organization: Not on file     Attends meetings of clubs or organizations: Not on file     Relationship status: Not on file   . Intimate partner violence:     Fear of current or ex partner: Not on file     Emotionally abused: Not on file     Physically abused: Not on file     Forced sexual activity: Not on file   Other Topics Concern   . Not on file   Social History Narrative   . Not on file       The following sections were reviewed this encounter by the provider:   Tobacco  Allergies  Meds  Problems  Med Hx  Surg Hx  Fam Hx         Vitals:  BP 134/67 (BP Site: Right arm, Patient Position: Sitting, Cuff Size: Medium)   Pulse 68   Temp 97.7 F (36.5 C) (Oral)   SpO2 95%       ROS:  General/Constitutional:   Denies Chills. Denies Fatigue. Denies Fever.   Ophthalmologic:   Denies Blurred vision. Denies Eye Pain.   ENT:   Denies Nasal Discharge. Denies Ear pain. Denies Sinus pain.   Endocrine:   Denies Polydipsia. Denies Polyuria.   Respiratory:   occ Cough. Denies Orthopnea. Denies Shortness of breath. +Wheezing.   Cardiovascular:   Denies Chest pain. Denies Chest pain with exertion. Denies Leg Claudication. Denies  Palpitations. Denies Swelling in hands/feet.   Gastrointestinal:   Denies Abdominal pain. Denies Blood in stool. Denies Constipation. Denies Diarrhea.  Denies Heartburn. Denies Nausea. Denies Vomiting.   Genitourinary:   Denies Blood in urine. Denies Frequent urination. Denies Painful urination.   + knee pain      Physical  Exam:  Constitutional: Well-developed, well-nourished. In no acute distress.   Head: Atraumatic. Normocephalic.  Neck: Normal range of motion. Neck supple. No carotid bruits.  Cardiovascular: Normal rate.  Regular rhythm, no S3, normal S1, normal S2, no S4.  No murmur heard. No rubs, thrills, or lifts.   Pulmonary/Chest: Breath sounds normal. No wheezing. Left LL rales ++  Abdominal: Bowel sounds are normal. Soft, non-tender, non-distended. No palpable masses. There is no rebound, rigidity, or guarding.   Musculoskeletal: Normal range of motion, + knee tenderness   Neurological: Alert. Awake.  Oriented to person, place, and time.  He walks with a cane due to B/L knee pain.  Extremities: Normal distal pulses. No edema on exam, No clubbing.  No cyanosis.  Skin: Skin is warm and moist. No exanthem  No pitting edema  Sensory exam of the foot is normal, tested with the monofilament. Good pulses, no lesions or ulcers, good peripheral pulses.  Swollen knee without redness    Lab Results   Component Value Date    CHOL 113 (L) 05/31/2006    CHOL 163 01/12/2006    TRIG 125 05/31/2006    TRIG 76 01/12/2006    LDL 45 (L) 05/31/2006    LDL 106 01/12/2006       Lab Results   Component Value Date    HGBA1C 7.6 (H) 06/02/2017    HGBA1C 8.2 (A) 12/01/2016    HGBA1C 7.1 (H) 08/13/2016     Lab Results   Component Value Date    WBC 4.49 01/10/2018    HGB 9.8 (L) 01/10/2018    HCT 29.7 (L) 01/10/2018    PLT 121 (L) 01/10/2018    CHOL 113 (L) 05/31/2006    TRIG 125 05/31/2006    LDL 45 (L) 05/31/2006    ALT 6 01/07/2018    AST 41 (H) 01/07/2018    NA 133 (L) 01/10/2018    K 4.4 01/10/2018    CL 100 01/10/2018    CREAT 2.8 (H) 01/10/2018    BUN 56.0 (H) 01/10/2018    CO2 20 (L) 01/10/2018    TSH 2.54 06/02/2017    PSA 1.0 01/12/2006    INR 1.2 (H) 01/07/2018    GLU 211 (H) 01/10/2018    HGBA1C 7.6 (H) 06/02/2017     Lab Results   Component Value Date    URICACID 9.0 01/08/2018         Assessment/Plan:       1. Gout, unspecified cause,  unspecified chronicity, unspecified site  - Ambulatory referral to Rheumatology  Uncontrolled.   uric acid level is at 9  Patient to continue allopurinol once a day given the renal function.  Follow-up with nephrology for Ross switch over to Uniretic if possible   also referring to Rheumatologist    2. Need for vaccination  - Flu vacc quad recombinant pres free 18 yrs& up; Future    3. Type 2 diabetes mellitus with complication, with long-term current use of insulin  Continue current medication.  Monitor blood pressure and sugar when on steroids  4. CKD (chronic kidney disease), stage IV  Patient not interested in the hemodialysis.  Had refused to have cardiac cath at the hospital because of the same stent.  Follow-up with nephrology  5. Essential hypertension  Chronic  Blood pressure is stable.  Monitor weights and output  If weight increases more than 2-3 lb or more than 5 pounds in a week.  Advised to call Fu up with cardiology closely        This note was generated by the Epic EMR system/ Dragon speech recognition and may contain inherent errors or omissions not intended by the user. Grammatical errors, random word insertions, deletions, pronoun errors and incomplete sentences are occasional consequences of this technology due to software limitations. Not all errors are caught or corrected. If there are questions or concerns about the content of this note or information contained within the body of this dictation they should be addressed directly with the author for clarification      Mattia Liford Sidonie Dickens, MD

## 2018-01-12 NOTE — Progress Notes (Signed)
Transitional Care Management    Writer contacted patient's daughter, Sair Faulcon, [phone] and Conlee Sliter, [phone], grandson, and left voicemail to follow up at [phone]. This was  2nd attempt for initial call.    Gorden Harms, MSW, LCSW, ACM-SW  Social Worker Case Freight forwarder II  [facility]  [phone]

## 2018-01-13 ENCOUNTER — Ambulatory Visit (INDEPENDENT_AMBULATORY_CARE_PROVIDER_SITE_OTHER): Payer: Self-pay | Admitting: Medical

## 2018-01-13 NOTE — Progress Notes (Signed)
Transitional Care Management    Writer contacted patient's daughter, [phone] and [phone] and left a voicemail to follow up at [phone]. This the 3rd attempt to reach for initial call.    Gorden Harms, MSW, LCSW, ACM-SW  Social Worker Case Freight forwarder II  [facility]  [phone]

## 2018-01-14 NOTE — Progress Notes (Signed)
Transitional Care Management    CM called pt and daughter Chrishun Scheer at home number to begin TCM disease education and management on AMI/CHF.  CM left voice mail message with program information TCM CM contact information and request for return call.  CM will close case after several attempts to reach with no call back.      Osie Bond, MSW  Case Manager  Transitional Care Management  [phone]

## 2018-01-16 ENCOUNTER — Encounter (INDEPENDENT_AMBULATORY_CARE_PROVIDER_SITE_OTHER): Payer: Self-pay | Admitting: Family Medicine

## 2018-01-16 NOTE — Progress Notes (Signed)
Medicare Focus Navigator is currently managing the patient for [facility] 10/05-10/11/2017 index admission. Per AVS patient was discharged with [facility] [phone]). Navigator made outreach to facility and spoke with receptionist Kennyth Lose. Receptionist stated, patient's start of care was 01/12/2018. Navigator made outreach to patient PCP Dr. Ernest Mallick at [phone]) and spoke with receptionist Almyra Free, whom stated patient was last seen on 01/12/2018 at 89. Navigator also, made outreach attempt to the patient at [phone] (home) and [phone]) Mobile. Navigator left message explaining the MFN program and contact information was provided for return call. Navigator will continue to manage the patient per MFN program protocol        Will Neila Gear  [facility] Coordinator  [facility]  [address], [place]  [place], [address]  T [phone]  F [phone]

## 2018-01-20 ENCOUNTER — Telehealth (INDEPENDENT_AMBULATORY_CARE_PROVIDER_SITE_OTHER): Payer: Self-pay | Admitting: Family Medicine

## 2018-01-20 NOTE — Telephone Encounter (Signed)
Received call from Baxter Flattery with [facility] to report patient is having increase in bilateral knee pain and is currently taking gabapentin and tylenol extra strength as needed. She is asking if you would like any medication changes and is also calling to report the update. Please advise. Thanks!

## 2018-01-20 NOTE — Telephone Encounter (Signed)
Baxter Flattery with [facility] was calling to report an increase in patient's chronic bilateral knee pain and was asking if you wanted to make any medication changes and also to report the update.  He is taking gabapentin for this and tylenol extra strength. Please advise. Thanks!

## 2018-01-20 NOTE — Telephone Encounter (Addendum)
Please call pt. He has gout - if pain worse should see his [place] to take Tyelnol for pain

## 2018-01-23 NOTE — Progress Notes (Signed)
Medicare Focus Navigator is currently managing the patient for [facility] 10/05-10/11/2017 index admission.    Per AVS patient was discharged with [facility] [phone]). Navigator made outreach to facility and spoke with receptionist [place]. Receptionist stated, patient's start of care and was seen on 01/12/2018.    Per Epic, patient does not have any upcoming appointments with the PCP Danella Sensing, MD [phone].     Navigator also, made outreach attempt to the patient at [phone] (home) and [phone]) Mobile. Navigator left message explaining the MFN program and contact information was provided for return call.    Navigator will continue to manage the patient per MFN program protocol        Will Neila Gear  [facility] Coordinator  [facility]  [address], [place]  [place], [address]  T [phone]  F [phone]

## 2018-01-23 NOTE — Telephone Encounter (Signed)
Left detailed message on phone.

## 2018-01-24 ENCOUNTER — Encounter (INDEPENDENT_AMBULATORY_CARE_PROVIDER_SITE_OTHER): Payer: Self-pay

## 2018-01-30 ENCOUNTER — Telehealth: Payer: Self-pay

## 2018-01-30 NOTE — Telephone Encounter (Signed)
TCM Medicare Focus Diagnosis Navigator      Medicare Focus Navigator managing the patient for [facility] 10/05-10/11/2017 index admission.     Navigator made outreach to the patient Marco Bruce at [phone]), left a message and contact information to contact.      Navigator will continue to manage the patient per MFN program protocol.      Will [place], [place] Medicare Coordinator  [facility]  [address] [place]  [place], [address]  T [phone]  F [phone]

## 2018-02-01 ENCOUNTER — Other Ambulatory Visit (INDEPENDENT_AMBULATORY_CARE_PROVIDER_SITE_OTHER): Payer: Self-pay | Admitting: Family Medicine

## 2018-02-01 DIAGNOSIS — M170 Bilateral primary osteoarthritis of knee: Secondary | ICD-10-CM

## 2018-02-01 DIAGNOSIS — E782 Mixed hyperlipidemia: Secondary | ICD-10-CM

## 2018-02-01 DIAGNOSIS — M79604 Pain in right leg: Secondary | ICD-10-CM

## 2018-02-01 DIAGNOSIS — E119 Type 2 diabetes mellitus without complications: Secondary | ICD-10-CM

## 2018-02-06 ENCOUNTER — Telehealth: Payer: Self-pay

## 2018-02-06 NOTE — Telephone Encounter (Signed)
Medicare Focus Navigator managing the patient for [facility] 10/05-10/11/2017 index admission.     Navigator made outreach to the patient Marco Bruce at [phone]), left a message informing patient daugther that this will be the final call made.    Navigator left and contact information to contact.      Medicare Focus Navigator will discharge the patient due to completion of 30 days following index admission which occurred dates 10/05-10/11/2017.          Will [place], [place] Medicare Coordinator  [facility]  [address], [place]  [place], [address]  T [phone]  F [phone]

## 2018-02-10 MED ORDER — GABAPENTIN 100 MG PO CAPS
ORAL_CAPSULE | ORAL | 2 refills | Status: DC
Start: 2018-02-10 — End: 2018-05-02

## 2018-02-10 MED ORDER — GABAPENTIN 100 MG PO CAPS
ORAL_CAPSULE | ORAL | 2 refills | Status: DC
Start: 2018-02-10 — End: 2018-02-10

## 2018-02-10 MED ORDER — LINAGLIPTIN 5 MG PO TABS
5.00 mg | ORAL_TABLET | Freq: Every day | ORAL | 2 refills | Status: DC
Start: 2018-02-10 — End: 2018-12-18

## 2018-02-10 MED ORDER — ATORVASTATIN CALCIUM 40 MG PO TABS
40.00 mg | ORAL_TABLET | Freq: Every evening | ORAL | 2 refills | Status: DC
Start: 2018-02-10 — End: 2018-05-02

## 2018-02-10 NOTE — Telephone Encounter (Signed)
Please fax gabapentin script to [facility].

## 2018-02-10 NOTE — Telephone Encounter (Signed)
Faxed gabapentin script to [id number]

## 2018-02-13 ENCOUNTER — Other Ambulatory Visit (INDEPENDENT_AMBULATORY_CARE_PROVIDER_SITE_OTHER): Payer: Self-pay | Admitting: Family Medicine

## 2018-02-13 DIAGNOSIS — E1122 Type 2 diabetes mellitus with diabetic chronic kidney disease: Secondary | ICD-10-CM

## 2018-02-13 MED ORDER — LANTUS SOLOSTAR 100 UNIT/ML SC SOPN
10.00 [IU] | PEN_INJECTOR | Freq: Every evening | SUBCUTANEOUS | 3 refills | Status: DC
Start: 2018-02-13 — End: 2018-04-11

## 2018-02-13 MED ORDER — SODIUM BICARBONATE 650 MG PO TABS
1300.00 mg | ORAL_TABLET | Freq: Two times a day (BID) | ORAL | 0 refills | Status: DC
Start: 2018-02-13 — End: 2018-03-06

## 2018-02-13 NOTE — Telephone Encounter (Signed)
Name, strength, directions of requested refill(s):    sodium bicarbonate 650 MG tablet    insulin glargine (LANTUS SOLOSTAR) 100 UNIT/ML injection pen      Pharmacy to send refill to or patient to pick up rx from office (mark requested pharmacy in BOLD):      [facility]  [place] [address]  Phone: [phone] Fax: [phone]    [facility] [address] [place] - [address] [address]  [address] [address]  [place] [address]  Phone: [phone] Fax: [phone]    [facility] - [place], [place]  [address]  [place] [address]  Phone: [phone] Fax: [phone]        Please mark "X" next to the preferred call back number:    Mobile:   Telephone Information:   Mobile [phone]       Home: @HOMEPHONE @    Work: @WORKPHONE @          Next visit: Visit date not found

## 2018-02-15 ENCOUNTER — Encounter (INDEPENDENT_AMBULATORY_CARE_PROVIDER_SITE_OTHER): Payer: Self-pay

## 2018-02-18 ENCOUNTER — Telehealth (INDEPENDENT_AMBULATORY_CARE_PROVIDER_SITE_OTHER): Payer: Self-pay | Admitting: Family Medicine

## 2018-02-18 DIAGNOSIS — Z794 Long term (current) use of insulin: Secondary | ICD-10-CM

## 2018-02-18 MED ORDER — INSULIN PEN NEEDLE 31G X 5 MM MISC
11 refills | Status: DC
Start: 2018-02-18 — End: 2018-04-09

## 2018-02-18 NOTE — Telephone Encounter (Signed)
Sent 11/2017 100 with 1 refill. Sent more

## 2018-02-18 NOTE — Telephone Encounter (Signed)
Needs pen needle RX sent to [facility]  Not in med list

## 2018-02-22 ENCOUNTER — Encounter (INDEPENDENT_AMBULATORY_CARE_PROVIDER_SITE_OTHER): Payer: Self-pay

## 2018-02-23 ENCOUNTER — Telehealth (INDEPENDENT_AMBULATORY_CARE_PROVIDER_SITE_OTHER): Payer: Self-pay | Admitting: Family Medicine

## 2018-02-23 NOTE — Telephone Encounter (Signed)
Name, strength, directions of requested refill(s):  Insulin Pen Needle (B-D UF III MINI PEN NEEDLES) 31G X 5 MM Misc  Pt has not had them in a week. Please advise    Pharmacy to send refill to or patient to pick up rx from office (mark requested pharmacy in BOLD):    [facility] [address] - [place], [place]  [address]  [place] [address]  Phone: [phone] Fax: [phone]    Please mark "X" next to the preferred call back number:    Mobile:   Telephone Information:   Mobile [phone]    X   Home: @HOMEPHONE @    Work: @WORKPHONE @          Next visit: Visit date not found

## 2018-02-23 NOTE — Telephone Encounter (Signed)
RX was sent to [facility] in error. I called RX in to [facility] in [facility] in [place]. Pt notified.  Pt was advised to call insurance to check in network eye doctors.

## 2018-02-23 NOTE — Telephone Encounter (Signed)
Pt's daughter is requesting for a recommendation for an eye doctor within their network. She can best be reached at [phone]  Please advise

## 2018-03-06 ENCOUNTER — Other Ambulatory Visit (INDEPENDENT_AMBULATORY_CARE_PROVIDER_SITE_OTHER): Payer: Self-pay | Admitting: Family Medicine

## 2018-03-06 DIAGNOSIS — N184 Chronic kidney disease, stage 4 (severe): Secondary | ICD-10-CM

## 2018-03-06 NOTE — Telephone Encounter (Signed)
Last Encounter:  01/12/2018  Next Appointment:  No pending  Last refill: 02/13/2018 #60, 0    Last Primary Care Labs: 01/10/2018  Lab Results   Component Value Date    WBC 4.49 01/10/2018    HGB 9.8 (L) 01/10/2018    HCT 29.7 (L) 01/10/2018    PLT 121 (L) 01/10/2018    CHOL 113 (L) 05/31/2006    TRIG 125 05/31/2006    LDL 45 (L) 05/31/2006    ALT 6 01/07/2018    AST 41 (H) 01/07/2018    NA 133 (L) 01/10/2018    K 4.4 01/10/2018    CL 100 01/10/2018    CREAT 2.8 (H) 01/10/2018    BUN 56.0 (H) 01/10/2018    CO2 20 (L) 01/10/2018    TSH 2.54 06/02/2017    PSA 1.0 01/12/2006    INR 1.2 (H) 01/07/2018    GLU 211 (H) 01/10/2018    HGBA1C 7.6 (H) 06/02/2017

## 2018-03-06 NOTE — Telephone Encounter (Signed)
LOV 01/12/18    Last Refill 02/13/18    Last in house Labs 08/08/17 BUT HAS MORE CURRENT HOSP LABS    Next appt  NONE

## 2018-03-07 MED ORDER — SODIUM BICARBONATE 650 MG PO TABS
1300.00 mg | ORAL_TABLET | Freq: Two times a day (BID) | ORAL | 0 refills | Status: DC
Start: 2018-03-07 — End: 2018-04-04

## 2018-03-07 NOTE — Telephone Encounter (Signed)
Daughter informed. She is asking about what they can give him for his arthritis which causes bilateral knee pain. She is asking if you can prescribe something. Please advise. thanks

## 2018-03-07 NOTE — Telephone Encounter (Signed)
I have been refilling this but he should call his nephrologist for future refill son this medication. Please let grandson or daughter know. Thanks

## 2018-03-13 ENCOUNTER — Encounter (INDEPENDENT_AMBULATORY_CARE_PROVIDER_SITE_OTHER): Payer: Self-pay

## 2018-03-22 ENCOUNTER — Encounter (INDEPENDENT_AMBULATORY_CARE_PROVIDER_SITE_OTHER): Payer: Self-pay

## 2018-04-03 ENCOUNTER — Other Ambulatory Visit (INDEPENDENT_AMBULATORY_CARE_PROVIDER_SITE_OTHER): Payer: Self-pay | Admitting: Family Medicine

## 2018-04-03 MED ORDER — PANTOPRAZOLE SODIUM 40 MG PO TBEC
40.00 mg | DELAYED_RELEASE_TABLET | Freq: Every evening | ORAL | 0 refills | Status: DC
Start: 2018-04-03 — End: 2018-06-29

## 2018-04-03 NOTE — Telephone Encounter (Signed)
Last Encounter:  01/12/2018  Next Appointment: None    Last Primary Care Labs:   Lab Results   Component Value Date    WBC 4.49 01/10/2018    HGB 9.8 (L) 01/10/2018    HCT 29.7 (L) 01/10/2018    PLT 121 (L) 01/10/2018    CHOL 113 (L) 05/31/2006    TRIG 125 05/31/2006    LDL 45 (L) 05/31/2006    ALT 6 01/07/2018    AST 41 (H) 01/07/2018    NA 133 (L) 01/10/2018    K 4.4 01/10/2018    CL 100 01/10/2018    CREAT 2.8 (H) 01/10/2018    BUN 56.0 (H) 01/10/2018    CO2 20 (L) 01/10/2018    TSH 2.54 06/02/2017    PSA 1.0 01/12/2006    INR 1.2 (H) 01/07/2018    GLU 211 (H) 01/10/2018    HGBA1C 7.6 (H) 06/02/2017

## 2018-04-04 ENCOUNTER — Other Ambulatory Visit (INDEPENDENT_AMBULATORY_CARE_PROVIDER_SITE_OTHER): Payer: Self-pay | Admitting: Family Medicine

## 2018-04-04 DIAGNOSIS — N184 Chronic kidney disease, stage 4 (severe): Secondary | ICD-10-CM

## 2018-04-04 MED ORDER — SODIUM BICARBONATE 650 MG PO TABS
1300.00 mg | ORAL_TABLET | Freq: Two times a day (BID) | ORAL | 0 refills | Status: DC
Start: 2018-04-04 — End: 2018-05-02

## 2018-04-04 NOTE — Telephone Encounter (Signed)
Last Encounter:  01/12/2018  Next Appointment:  none    Last Primary Care Labs: 01/2018  Lab Results   Component Value Date    WBC 4.49 01/10/2018    HGB 9.8 (L) 01/10/2018    HCT 29.7 (L) 01/10/2018    PLT 121 (L) 01/10/2018    CHOL 113 (L) 05/31/2006    TRIG 125 05/31/2006    LDL 45 (L) 05/31/2006    ALT 6 01/07/2018    AST 41 (H) 01/07/2018    NA 133 (L) 01/10/2018    K 4.4 01/10/2018    CL 100 01/10/2018    CREAT 2.8 (H) 01/10/2018    BUN 56.0 (H) 01/10/2018    CO2 20 (L) 01/10/2018    TSH 2.54 06/02/2017    PSA 1.0 01/12/2006    INR 1.2 (H) 01/07/2018    GLU 211 (H) 01/10/2018    HGBA1C 7.6 (H) 06/02/2017

## 2018-04-07 ENCOUNTER — Encounter (INDEPENDENT_AMBULATORY_CARE_PROVIDER_SITE_OTHER): Payer: Self-pay | Admitting: Family Medicine

## 2018-04-08 ENCOUNTER — Inpatient Hospital Stay
Admission: EM | Admit: 2018-04-08 | Discharge: 2018-04-11 | DRG: 554 | Disposition: A | Discharge status: Home Health | Payer: Medicare Other | Attending: Hospitalist | Admitting: Hospitalist

## 2018-04-08 ENCOUNTER — Emergency Department: Payer: Medicare Other

## 2018-04-08 DIAGNOSIS — I5042 Chronic combined systolic (congestive) and diastolic (congestive) heart failure: Secondary | ICD-10-CM | POA: Diagnosis present

## 2018-04-08 DIAGNOSIS — D649 Anemia, unspecified: Secondary | ICD-10-CM | POA: Diagnosis present

## 2018-04-08 DIAGNOSIS — M25462 Effusion, left knee: Secondary | ICD-10-CM | POA: Diagnosis present

## 2018-04-08 DIAGNOSIS — N184 Chronic kidney disease, stage 4 (severe): Secondary | ICD-10-CM | POA: Diagnosis present

## 2018-04-08 DIAGNOSIS — M1990 Unspecified osteoarthritis, unspecified site: Secondary | ICD-10-CM | POA: Diagnosis present

## 2018-04-08 DIAGNOSIS — I2510 Atherosclerotic heart disease of native coronary artery without angina pectoris: Secondary | ICD-10-CM | POA: Diagnosis present

## 2018-04-08 DIAGNOSIS — IMO0001 Reserved for inherently not codable concepts without codable children: Secondary | ICD-10-CM | POA: Diagnosis present

## 2018-04-08 DIAGNOSIS — N19 Unspecified kidney failure: Secondary | ICD-10-CM

## 2018-04-08 DIAGNOSIS — M170 Bilateral primary osteoarthritis of knee: Secondary | ICD-10-CM | POA: Diagnosis present

## 2018-04-08 DIAGNOSIS — I252 Old myocardial infarction: Secondary | ICD-10-CM

## 2018-04-08 DIAGNOSIS — Z79899 Other long term (current) drug therapy: Secondary | ICD-10-CM

## 2018-04-08 DIAGNOSIS — G8929 Other chronic pain: Secondary | ICD-10-CM

## 2018-04-08 DIAGNOSIS — Z950 Presence of cardiac pacemaker: Secondary | ICD-10-CM

## 2018-04-08 DIAGNOSIS — R627 Adult failure to thrive: Secondary | ICD-10-CM | POA: Diagnosis present

## 2018-04-08 DIAGNOSIS — Z7982 Long term (current) use of aspirin: Secondary | ICD-10-CM

## 2018-04-08 DIAGNOSIS — M1A9XX0 Chronic gout, unspecified, without tophus (tophi): Secondary | ICD-10-CM | POA: Diagnosis present

## 2018-04-08 DIAGNOSIS — N179 Acute kidney failure, unspecified: Secondary | ICD-10-CM | POA: Diagnosis not present

## 2018-04-08 DIAGNOSIS — Z7952 Long term (current) use of systemic steroids: Secondary | ICD-10-CM

## 2018-04-08 DIAGNOSIS — I509 Heart failure, unspecified: Secondary | ICD-10-CM | POA: Diagnosis present

## 2018-04-08 DIAGNOSIS — M25461 Effusion, right knee: Secondary | ICD-10-CM | POA: Diagnosis present

## 2018-04-08 DIAGNOSIS — M109 Gout, unspecified: Principal | ICD-10-CM | POA: Diagnosis present

## 2018-04-08 DIAGNOSIS — M353 Polymyalgia rheumatica: Secondary | ICD-10-CM | POA: Diagnosis present

## 2018-04-08 DIAGNOSIS — Z8249 Family history of ischemic heart disease and other diseases of the circulatory system: Secondary | ICD-10-CM

## 2018-04-08 DIAGNOSIS — E1122 Type 2 diabetes mellitus with diabetic chronic kidney disease: Secondary | ICD-10-CM | POA: Diagnosis present

## 2018-04-08 DIAGNOSIS — R9431 Abnormal electrocardiogram [ECG] [EKG]: Secondary | ICD-10-CM

## 2018-04-08 DIAGNOSIS — E782 Mixed hyperlipidemia: Secondary | ICD-10-CM | POA: Diagnosis present

## 2018-04-08 DIAGNOSIS — Z794 Long term (current) use of insulin: Secondary | ICD-10-CM

## 2018-04-08 DIAGNOSIS — Z6829 Body mass index (BMI) 29.0-29.9, adult: Secondary | ICD-10-CM

## 2018-04-08 DIAGNOSIS — Z66 Do not resuscitate: Secondary | ICD-10-CM | POA: Diagnosis present

## 2018-04-08 DIAGNOSIS — I10 Essential (primary) hypertension: Secondary | ICD-10-CM | POA: Diagnosis present

## 2018-04-08 DIAGNOSIS — E1165 Type 2 diabetes mellitus with hyperglycemia: Secondary | ICD-10-CM | POA: Diagnosis not present

## 2018-04-08 DIAGNOSIS — I130 Hypertensive heart and chronic kidney disease with heart failure and stage 1 through stage 4 chronic kidney disease, or unspecified chronic kidney disease: Secondary | ICD-10-CM | POA: Diagnosis present

## 2018-04-08 LAB — COMPREHENSIVE METABOLIC PANEL
ALT: <6 U/L (ref 0–55)
AST (SGOT): 19 U/L (ref 5–34)
Albumin/Globulin Ratio: 1.4 (ref 0.9–2.2)
Albumin: 3.9 g/dL (ref 3.5–5.0)
Alkaline Phosphatase: 54 U/L (ref 38–106)
BUN: 45 mg/dL — ABNORMAL HIGH (ref 9.0–28.0)
Bilirubin, Total: 1 mg/dL (ref 0.2–1.2)
CO2: 19 mEq/L — ABNORMAL LOW (ref 22–29)
Calcium: 9.7 mg/dL (ref 7.9–10.2)
Chloride: 105 mEq/L (ref 100–111)
Creatinine: 2.4 mg/dL — ABNORMAL HIGH (ref 0.7–1.3)
Globulin: 2.7 g/dL (ref 2.0–3.6)
Glucose: 148 mg/dL — ABNORMAL HIGH (ref 70–100)
Potassium: 4.6 mEq/L (ref 3.5–5.1)
Protein, Total: 6.6 g/dL (ref 6.0–8.3)
Sodium: 139 mEq/L (ref 136–145)

## 2018-04-08 LAB — CBC AND DIFFERENTIAL
Absolute NRBC: 0 10*3/uL (ref 0.00–0.00)
Basophils Absolute Automated: 0.05 10*3/uL (ref 0.00–0.08)
Basophils Automated: 0.5 %
Eosinophils Absolute Automated: 0.55 10*3/uL — ABNORMAL HIGH (ref 0.00–0.44)
Eosinophils Automated: 5.9 %
Hematocrit: 35 % — ABNORMAL LOW (ref 37.6–49.6)
Hgb: 11.1 g/dL — ABNORMAL LOW (ref 12.5–17.1)
Immature Granulocytes Absolute: 0.04 10*3/uL (ref 0.00–0.07)
Immature Granulocytes: 0.4 %
Lymphocytes Absolute Automated: 1.9 10*3/uL (ref 0.42–3.22)
Lymphocytes Automated: 20.3 %
MCH: 29.4 pg (ref 25.1–33.5)
MCHC: 31.7 g/dL (ref 31.5–35.8)
MCV: 92.6 fL (ref 78.0–96.0)
MPV: 10.3 fL (ref 8.9–12.5)
Monocytes Absolute Automated: 0.78 10*3/uL (ref 0.21–0.85)
Monocytes: 8.4 %
Neutrophils Absolute: 6.02 10*3/uL (ref 1.10–6.33)
Neutrophils: 64.5 %
Nucleated RBC: 0 /100 WBC (ref 0.0–0.0)
Platelets: 175 10*3/uL (ref 142–346)
RBC: 3.78 10*6/uL — ABNORMAL LOW (ref 4.20–5.90)
RDW: 15 % (ref 11–15)
WBC: 9.34 10*3/uL (ref 3.10–9.50)

## 2018-04-08 LAB — GFR: EGFR: 25.6

## 2018-04-08 LAB — PT AND APTT
PT INR: 1.2 — ABNORMAL HIGH (ref 0.9–1.1)
PT: 14.8 s (ref 12.6–15.0)
PTT: 47 s — ABNORMAL HIGH (ref 23–37)

## 2018-04-08 LAB — LIPASE: Lipase: 28 U/L (ref 8–78)

## 2018-04-08 MED ORDER — ONDANSETRON HCL 4 MG/2ML IJ SOLN
4.00 mg | Freq: Once | INTRAMUSCULAR | Status: AC
Start: 2018-04-08 — End: 2018-04-08
  Administered 2018-04-08: 4 mg via INTRAVENOUS
  Filled 2018-04-08: qty 2

## 2018-04-08 NOTE — ED Provider Notes (Signed)
EMERGENCY DEPT VISIT NOTE    Patient information was obtained from patient, family    History/Exam limitations: language barrier    History of Present Illness    Chief Complaint  Emesis; Knee Pain; and Abdominal Pain    Marco Bruce is a 85 y.o. male with PMHx significant for T2DM, CKD, HTN, CHF, pacemaker, NSTEMI and chronic pain syndrome p/w moderate constant worsening pain in his knees that shoots up into his groin.   Current pain began last night but he has had this pain over the past year. They were told pain was d/t arthritis in his knees. Family notes he is too weak for knee replacement. He is having difficulty using the stairs at home.     Patient pain is severe, sharp, left more than right knee radiating proximally to his femur and groin.  No associated rash, no urinary symptoms    Also c/o cough, nausea and vomiting. No diarrhea, CP or SOB. No recent falls.   No h/o lung disease or lung problems. No sick contacts. No abd pain or back pain    Past Medical History:   Diagnosis Date    Acute diastolic heart failure 01/65/5374    Acute on chronic diastolic congestive heart failure 08/13/2016    Acute pulmonary edema 05/26/2015    ARF (acute renal failure) 05/26/2015    Chronic diastolic heart failure 82/70/7867    Chronic gout     Chronic pain syndrome 07/07/2015    CKD (chronic kidney disease) stage 4, GFR 15-29 ml/min     2018 baseline creatinine in EPIC is 1.5-2.3.    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      Current Outpatient Medications   Medication Sig Dispense Refill    albuterol-ipratropium (DUO-NEB) 2.5-0.5(3) mg/3 mL nebulizer Take 3 mLs by nebulization 2 (two) times daily as needed (SOB) 3 mL 0    allopurinol (ZYLOPRIM) 100 MG tablet Take 1 tablet (100 mg total) by mouth daily 90 tablet 3    aspirin 81 MG chewable tablet Chew 1 tablet (81 mg total) by mouth daily      atorvastatin (LIPITOR) 40 MG tablet Take 1 tablet (40 mg total) by mouth nightly 90 tablet 2    cetirizine (ZYRTEC) 10 MG tablet Take 1 tablet (10 mg total) by mouth daily as needed for Rhinitis.      dextromethorphan-guaiFENesin (MUCINEX DM) 30-600 MG per 12 hr tablet Take 1 tablet by mouth 2 (two) times daily as needed (cough)      furosemide (LASIX) 40 MG tablet Take 1 tablet (40 mg total) by  mouth daily      gabapentin (NEURONTIN) 100 MG capsule TAKE ONE CAPSULE BY MOUTH EVERY 8 HOURS 90 capsule 2    insulin glargine (LANTUS SOLOSTAR) 100 UNIT/ML injection pen Inject 10 Units into the skin nightly 9 mL 3    Insulin Pen Needle (B-D UF III MINI PEN NEEDLES) 31G X 5 MM Misc Use with insulin 30 each 11    linaGLIPtin 5 MG Tab Take 1 tablet (5 mg total) by mouth daily 90 tablet 2    metoprolol succinate XL (TOPROL-XL) 25 MG 24 hr tablet Take 0.5 tablets (12.5 mg total) by mouth daily 30 tablet 1    pantoprazole (PROTONIX) 40 MG tablet Take 1 tablet (40 mg total) by mouth nightly 90 tablet 0    predniSONE (DELTASONE) 10 MG tablet Take 30mg  01/11/2018 morning with food (3 tabs), 20mg  for 3 more days (2 tabs), then 10mg  for 3 more days (1 tab) 12 tablet 0    Probiotic Product (RISAQUAD PO) Take 1 capsule by mouth daily.       senna-docusate (PERICOLACE) 8.6-50 MG per tablet Take 2 tablets by mouth 2 (two) times daily      sodium bicarbonate 650 MG tablet Take 2 tablets (1,300 mg total) by mouth 2 (two) times daily 60 tablet 0     No Known Allergies  Social History     Socioeconomic History    Marital status: Widowed     Spouse name: Not on file    Number of children: Not on file    Years of education: Not on file    Highest education level: Not on file   Occupational History    Not on file   Social Needs    Financial resource strain: Not on file    Food insecurity:     Worry: Not on file     Inability: Not on file    Transportation needs:     Medical: Not on file     Non-medical: Not on file   Tobacco Use    Smoking status: Never Smoker    Smokeless tobacco: Never Used   Substance and Sexual Activity    Alcohol use: No     Comment: former alcoholic    Drug use: No    Sexual activity: Not on file   Lifestyle    Physical activity:     Days per week: Not on file     Minutes per session: Not on file    Stress: Not on file   Relationships    Social connections:     Talks on phone: Not on file     Gets together: Not on file     Attends religious service: Not on file     Active member of club or organization: Not on file     Attends meetings of clubs or organizations: Not on file     Relationship status: Not on file    Intimate partner violence:     Fear of current or ex partner: Not on file     Emotionally abused: Not on file     Physically abused: Not on file     Forced sexual activity: Not on file   Other Topics Concern    Not on file   Social History Narrative    Not on file       Review of Systems    Positive and negative systems as per HPI.  All other systems reviewed and are negative.  Physical Exam  BP 135/66    Pulse 81    Temp 99.4 F (37.4 C) (Oral)    Resp 17    Ht 5\' 2"  (1.575 m)    Wt 72.1 kg    SpO2 94%    BMI 29.08 kg/m     Nursing note and vitals reviewed  Constitutional: Elderly, chronically  ill-appearing. Mild distress. Laying flat, interactive, speaks clearly & completely  Head: Normocephalic and atraumatic.  Eyes: Conjunctiva and EOM are normal. PERRL, 45mm bilat  ENT: Nose clear. No redness. MM moist, no tonsillar exudate or asymmetry  Cardiovascular: Normal rate, regular rhythm. 1+ DP pulses bilat  Pulmonary: Effort normal and breath sounds normal. No crackles/wheezing.  Abdominal: Soft, No distention. There is no focal tenderness. There is no  rebound and no guarding.  GU: Testicles normal, nontender. No discoloration. Normal lie. Normal cremasteric reflex  No evidence inguinal hernia    Musculoskeletal: Limited range of motion bilateral knees with associated crepitus, unable to flex past 90 degrees. + Effusion left more than right, no associated warmth, no overlying rash.  No instability with anterior, posterior, medial, lateral stress  No pain with passive logroll of bilat legs  Trace edema left pretibial, no edema right pretibial  Neurological: Alert and oriented to person, place, and time. Grossly normal sensation and strength.  5/5 bilateral dorsiflexion and plantarflexion  Skin: Skin is warm and dry. No rash noted.      ED treatment:    Medications   ondansetron (ZOFRAN) injection 4 mg (4 mg Intravenous Given 04/08/18 2339)   traMADol (ULTRAM) tablet 50 mg (50 mg Oral Given 04/09/18 0034)   oxyCODONE-acetaminophen (PERCOCET) 5-325 MG per tablet 1 tablet (1 tablet Oral Given 04/09/18 0416)         Medical records reviewed    Labs reviewed    Labs Reviewed   CBC AND DIFFERENTIAL - Abnormal; Notable for the following components:       Result Value    Hgb 11.1 (*)     Hematocrit 35.0 (*)     RBC 3.78 (*)     Abs Eos Automated 0.55 (*)     All other components within normal limits    Narrative:     Replace urinary catheter prior to obtaining the urine culture  if it has been in place for greater than or equal to 14  days:->N/A No Foley  Indications for U/A Reflex to Micro - Reflex  to  Culture:->Suprapubic Pain/Tenderness or Dysuria   PT AND APTT - Abnormal; Notable for the following components:    PT INR 1.2 (*)     PTT 47 (*)     All other components within normal limits    Narrative:     Replace urinary catheter prior to obtaining the urine culture  if it has been in place for greater than or equal to 14  days:->N/A No Foley  Indications for U/A Reflex to Micro - Reflex to  Culture:->Suprapubic Pain/Tenderness or Dysuria   COMPREHENSIVE METABOLIC PANEL - Abnormal; Notable for the following components:    Glucose 148 (*)     BUN 45.0 (*)     Creatinine 2.4 (*)     CO2 19 (*)     All other components within normal limits    Narrative:     Replace urinary catheter prior to obtaining the urine culture  if it has been in place for greater than or equal to 14  days:->N/A No Foley  Indications for U/A Reflex to Micro - Reflex to  Culture:->Suprapubic Pain/Tenderness or Dysuria   URINALYSIS REFLEX TO MICROSCOPIC EXAM - REFLEX TO CULTURE - Abnormal; Notable for the following components:    Protein, UR 100 (*)     Ketones UA 20 (*)     Blood, UA Small (*)     All other components within normal limits    Narrative:     Replace urinary catheter prior to obtaining the urine culture  if it has been in place for greater than or equal to 14  days:->N/A No Foley  Indications for U/A Reflex to Micro - Reflex to  Culture:->Suprapubic Pain/Tenderness or Dysuria   LIPASE    Narrative:     Replace urinary catheter prior to obtaining the urine culture  if it has been in place for greater than or equal to 14  days:->N/A No Foley  Indications for U/A Reflex to Micro - Reflex to  Culture:->Suprapubic Pain/Tenderness or Dysuria   GFR    Narrative:     Replace urinary catheter prior to obtaining the urine culture  if it has been in place for greater than or equal to 14  days:->N/A No Foley  Indications for U/A Reflex to Micro - Reflex to  Culture:->Suprapubic Pain/Tenderness or Dysuria       O2           saturation:  94 %; Oxygen use: room air; Interpretation: Normal    Down to 88% in the room, abnormal    Radiology -             interpreted by me with the following observations: CXR nonacute    EKG: Reviewed by myself. 60s V paced. No signs of concerning ST  depression/elevation, + PR, QRS, QT prolongation. Nml Twaves compared to  Prior (T inv laterally).      Imaging reviewed    US Venous Duplex Doppler Leg Bilateral   Final Result          No deep venous thrombosis of the BILATERAL lower extremities.      Lytle Michaels, MD    04/09/2018 2:46 AM      XR Pelvis Limited 1 Or 2 Views   Final Result          No acute abnormality.      Loyal Buba, MD    04/09/2018 1:48 AM      CT Chest without Contrast   Final Result      No acute abnormality.      Nonacute findings detailed above.             Loyal Buba, MD    04/09/2018 1:26 AM      CT Head WO Contrast   Final Result    No acute intracranial abnormality is seen.              [name], MD    04/08/2018 9:52 PM      XR Chest  AP Portable   Final Result     Cardiomegaly.      Virl Diamond, MD    04/08/2018 8:47 PM            Consults:  Medicine - to consult in ED      ED Course & MDM:     Abd is soft/benign on exam with no rebound/guarding. I have minimal suspicion for such acute process as bowel perforation/ischemia/obstruction, cholecystitis, pancreatitis, appendicitis, or complicated diverticulitis.  No signs or symptoms acutely concerning for genitourinary pathology including Fournier's gangrene/necrotizing fasciitis.      I am concerned that he had intermittent hypoxia during my exam to the upper 80s, although on further questioning he denies any hemoptysis or chest pain or dyspnea.  I recommended noncontrast CT scan given his degree of chronic kidney disease  Evaluate ultrasound of bilateral lower extremities for DVT    Meds, reeval    410a -CT of the chest with no anatomic pathology to explain patient's intermittent hypoxia, no evidence of pneumonia or pulmonary  infarct  No evidence DVT in the bilateral lower extremities.  Continues to have severe pain despite Ultram, attempt Percocet, but family states regardless they have had to carry him around the house and he has become completely dependent and unable to ambulate with his walker which is his usual baseline.  I involved case mgmt to assist w potential increased home health assistance and home safety evaluation.  Admit given his inability to ambulate    Diagnosis:   1. Other chronic pain    2. Renal failure, unspecified chronicity      Disposition:   ED Disposition     ED Disposition Condition Date/Time Comment    Observation  Sun Apr 09, 2018  4:16 AM Admitting Physician: [name] [[id number]]   Diagnosis: Chronic pain [767341]   Estimated Length of Stay: < 2 midnights   Tentative Discharge Plan?: Home or Self Care [1]   Patient Class: Observation [104]          Condition: Stable      Arvin Collard, MD  4:29 AM    I was acting as a scribe for Roxanne Gates, MD on [place]    Treatment Team: Scribe: Henrene Pastor is scribing for me on [facility]. This note and the patient instructions accurately reflect work and decisions made by me.  Deshaun Weisinger E, MD    *This note was generated by the Epic EMR system/ Dragon speech recognition and may contain inherent errors or omissions not intended by the user. Grammatical errors, random word insertions, deletions, pronoun errors and incomplete sentences are occasional consequences of this technology due to software limitations. Not all errors are caught or corrected. If there are questions or concerns about the content of this note or information contained within the body of this dictation they should be addressed directly with the author for clarification.Durene Fruits, Christel Mormon, MD  04/09/18 [phone]

## 2018-04-08 NOTE — ED Provider Notes (Signed)
Triage MD Note:  This patient has been seen by me or placed for labs/radiology studies per request of the Triage RN staff. I am not the primary provider.  Casimiro Needle, M.D.   8:00 PM         Marti Sleigh Gladys Damme, MD  04/08/18 [place]

## 2018-04-08 NOTE — EDIE (Signed)
COLLECTIVE?NOTIFICATION?04/08/2018 19:55?Marco Bruce?MRN: [id number]    Criteria Met      5 ED Visits in 12 Months    Security and Safety  No recent Security Events currently on file    ED Care Guidelines  There are currently no ED Care Guidelines for this patient. Please check your facility's medical records system.        Prescription Monitoring Program  000??- Narcotic Use Score  000??- Sedative Use Score  000??- Stimulant Use Score  000??- Overdose Risk Score  - All Scores range from 000-999 with 75% of the population scoring < 200 and on 1% scoring above 650  - The last digit of the narcotic, sedative, and stimulant score indicates the number of active prescriptions of that type  - Higher Use scores correlate with increased prescribers, pharmacies, mg equiv, and overlapping prescriptions  - Higher Overdose Risk Scores correlate with increased risk of unintentional overdose death   Concerning or unexpectedly high scores should prompt a review of the PMP record; this does not constitute checking PMP for prescribing purposes.      E.D. Visit Count (12 mo.)  Facility Visits   [facility] 2   Total 5   Note: Visits indicate total known visits.      Recent Emergency Department Visit Summary  Date Facility [facility] Type Diagnoses or Chief Complaint   Apr 08, 2018 [place] [place]      TRIAGE-      Jan 07, 2018 [place] [place]      triage      triage knee pain      triage knee pain/ vomiting      Cough      Hematemesis      Knee Pain      Vomiting, unspecified      Non-ST elevation (NSTEMI) myocardial infarction      1 Chronic kidney disease, stage 4 (severe)      Presence of cardiac pacemaker      Dec 01, 2017 [place]      Abd pain      Shortness of Breath      Knee Pain      Jun 02, 2017 [facility]      illness      Fever      Hip Pain      Joint Pain      Weakness       Hypo-osmolality and hyponatremia      Mixed hyperlipidemia      Essential (primary) hypertension      Long term (current) use of insulin      1 Chronic kidney disease, stage 4 (severe)      Apr 29, 2017 [place]      abdominal pain      Nausea with vomiting, unspecified      Generalized abdominal pain      Disorder of kidney and ureter, unspecified          Recent Inpatient Visit Summary  Date [place] Type Diagnoses or Chief Complaint   Jan 07, 2018 [place] [place]      1 Chronic kidney disease, stage 4 (severe)      Presence of cardiac pacemaker      Acute on chronic  systolic (congestive) heart failure      Vomiting, unspecified      Non-ST elevation (NSTEMI) myocardial infarction      Bilateral primary osteoarthritis of knee      Low back pain      Acute on chronic diastolic (congestive) heart failure      Dec 01, 2017 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      Weakness      Hypo-osmolality and hyponatremia      Essential (primary) hypertension      1 Chronic kidney disease, stage 4 (severe)      Mixed hyperlipidemia      1 Type 2 diabetes mellitus with unspecified complications      Long term (current) use of insulin      Acute on chronic diastolic (congestive) heart failure      Chronic systolic (congestive) heart failure          Care Team  Provider Specialty Phone Fax Service Dates   Ernest Mallick , MD Family Medicine   Current    [facility] [phone]  Current      Collect[employer] Portal  This patient has registered at the [facility] Emergency  Department   For more information visit: [email or web address]     PLEASE NOTE:     1.   Any care recommendations and other clinical information are provided as guidelines or for historical purposes only, and providers should exercise their own clinical judgment when providing care.    2.   You may only use this information for purposes of treatment, payment or health care operations activities, and subject to the limitations of applicable [employer] Policies.    3.   You should consult directly with the organization that provided a care guideline or other clinical history with any questions about additional information or accuracy or completeness of information provided.    ? 2020 [employer] - [email or web address]

## 2018-04-09 ENCOUNTER — Emergency Department: Payer: Medicare Other

## 2018-04-09 ENCOUNTER — Observation Stay: Payer: Medicare Other

## 2018-04-09 ENCOUNTER — Encounter: Payer: Self-pay | Admitting: Hospitalist

## 2018-04-09 DIAGNOSIS — M25561 Pain in right knee: Secondary | ICD-10-CM

## 2018-04-09 DIAGNOSIS — R627 Adult failure to thrive: Secondary | ICD-10-CM

## 2018-04-09 DIAGNOSIS — N189 Chronic kidney disease, unspecified: Secondary | ICD-10-CM

## 2018-04-09 DIAGNOSIS — R262 Difficulty in walking, not elsewhere classified: Secondary | ICD-10-CM

## 2018-04-09 DIAGNOSIS — E1122 Type 2 diabetes mellitus with diabetic chronic kidney disease: Secondary | ICD-10-CM

## 2018-04-09 DIAGNOSIS — G8929 Other chronic pain: Secondary | ICD-10-CM | POA: Diagnosis present

## 2018-04-09 DIAGNOSIS — D649 Anemia, unspecified: Secondary | ICD-10-CM

## 2018-04-09 DIAGNOSIS — I130 Hypertensive heart and chronic kidney disease with heart failure and stage 1 through stage 4 chronic kidney disease, or unspecified chronic kidney disease: Secondary | ICD-10-CM

## 2018-04-09 DIAGNOSIS — Z794 Long term (current) use of insulin: Secondary | ICD-10-CM

## 2018-04-09 DIAGNOSIS — M1A9XX0 Chronic gout, unspecified, without tophus (tophi): Secondary | ICD-10-CM | POA: Diagnosis present

## 2018-04-09 DIAGNOSIS — I509 Heart failure, unspecified: Secondary | ICD-10-CM

## 2018-04-09 DIAGNOSIS — Z950 Presence of cardiac pacemaker: Secondary | ICD-10-CM

## 2018-04-09 DIAGNOSIS — M25562 Pain in left knee: Secondary | ICD-10-CM

## 2018-04-09 LAB — COMPREHENSIVE METABOLIC PANEL
ALT: <6 U/L (ref 0–55)
AST (SGOT): 28 U/L (ref 5–34)
Albumin/Globulin Ratio: 1.3 (ref 0.9–2.2)
Albumin: 3.7 g/dL (ref 3.5–5.0)
Alkaline Phosphatase: 45 U/L (ref 38–106)
BUN: 52 mg/dL — ABNORMAL HIGH (ref 9.0–28.0)
Bilirubin, Total: 0.9 mg/dL (ref 0.2–1.2)
CO2: 19 mEq/L — ABNORMAL LOW (ref 22–29)
Calcium: 9.6 mg/dL (ref 7.9–10.2)
Chloride: 105 mEq/L (ref 100–111)
Creatinine: 2.8 mg/dL — ABNORMAL HIGH (ref 0.7–1.3)
Globulin: 2.8 g/dL (ref 2.0–3.6)
Glucose: 183 mg/dL — ABNORMAL HIGH (ref 70–100)
Potassium: 4.9 mEq/L (ref 3.5–5.1)
Protein, Total: 6.5 g/dL (ref 6.0–8.3)
Sodium: 138 mEq/L (ref 136–145)

## 2018-04-09 LAB — URINALYSIS REFLEX TO MICROSCOPIC EXAM - REFLEX TO CULTURE
Bilirubin, UA: NEGATIVE
Glucose, UA: NEGATIVE
Ketones UA: 20 — AB
Leukocyte Esterase, UA: NEGATIVE
Nitrite, UA: NEGATIVE
Protein, UR: 100 — AB
Specific Gravity UA: 1.017 (ref 1.001–1.035)
Urine pH: 5 (ref 5.0–8.0)
Urobilinogen, UA: NORMAL mg/dL (ref 0.2–2.0)

## 2018-04-09 LAB — ECG 12-LEAD
Atrial Rate: 70 {beats}/min
P Axis: -23 degrees
P-R Interval: 152 ms
Q-T Interval: 468 ms
QRS Duration: 162 ms
QTC Calculation (Bezet): 497 ms
R Axis: -60 degrees
T Axis: 118 degrees
Ventricular Rate: 68 {beats}/min

## 2018-04-09 LAB — CBC AND DIFFERENTIAL
Absolute NRBC: 0 10*3/uL (ref 0.00–0.00)
Basophils Absolute Automated: 0.04 10*3/uL (ref 0.00–0.08)
Basophils Automated: 0.5 %
Eosinophils Absolute Automated: 0.01 10*3/uL (ref 0.00–0.44)
Eosinophils Automated: 0.1 %
Hematocrit: 35.1 % — ABNORMAL LOW (ref 37.6–49.6)
Hgb: 11.2 g/dL — ABNORMAL LOW (ref 12.5–17.1)
Immature Granulocytes Absolute: 0.02 10*3/uL (ref 0.00–0.07)
Immature Granulocytes: 0.3 %
Lymphocytes Absolute Automated: 1.63 10*3/uL (ref 0.42–3.22)
Lymphocytes Automated: 20.9 %
MCH: 29.4 pg (ref 25.1–33.5)
MCHC: 31.9 g/dL (ref 31.5–35.8)
MCV: 92.1 fL (ref 78.0–96.0)
MPV: 11.2 fL (ref 8.9–12.5)
Monocytes Absolute Automated: 0.76 10*3/uL (ref 0.21–0.85)
Monocytes: 9.8 %
Neutrophils Absolute: 5.33 10*3/uL (ref 1.10–6.33)
Neutrophils: 68.4 %
Nucleated RBC: 0 /100 WBC (ref 0.0–0.0)
Platelets: 180 10*3/uL (ref 142–346)
RBC: 3.81 10*6/uL — ABNORMAL LOW (ref 4.20–5.90)
RDW: 15 % (ref 11–15)
WBC: 7.79 10*3/uL (ref 3.10–9.50)

## 2018-04-09 LAB — GFR: EGFR: 21.4

## 2018-04-09 LAB — URIC ACID: Uric acid: 10.3 mg/dL — ABNORMAL HIGH (ref 3.6–9.7)

## 2018-04-09 LAB — GLUCOSE WHOLE BLOOD - POCT
Whole Blood Glucose POCT: 163 mg/dL — ABNORMAL HIGH (ref 70–100)
Whole Blood Glucose POCT: 182 mg/dL — ABNORMAL HIGH (ref 70–100)
Whole Blood Glucose POCT: 201 mg/dL — ABNORMAL HIGH (ref 70–100)
Whole Blood Glucose POCT: 202 mg/dL — ABNORMAL HIGH (ref 70–100)

## 2018-04-09 LAB — PT/INR
PT INR: 1.2 — ABNORMAL HIGH (ref 0.9–1.1)
PT: 15 s (ref 12.6–15.0)

## 2018-04-09 LAB — C-REACTIVE PROTEIN: C-Reactive Protein: 17.4 mg/dL — ABNORMAL HIGH (ref 0.0–0.8)

## 2018-04-09 LAB — SEDIMENTATION RATE: Sed Rate: 88 mm/Hr — ABNORMAL HIGH (ref 0–15)

## 2018-04-09 MED ORDER — ASPIRIN 81 MG PO CHEW
81.00 mg | CHEWABLE_TABLET | Freq: Every day | ORAL | Status: DC
Start: 2018-04-09 — End: 2018-04-11
  Administered 2018-04-09 – 2018-04-11 (×3): 81 mg via ORAL
  Filled 2018-04-09 (×3): qty 1

## 2018-04-09 MED ORDER — PANTOPRAZOLE SODIUM 40 MG PO TBEC
40.00 mg | DELAYED_RELEASE_TABLET | Freq: Every morning | ORAL | Status: DC
Start: 2018-04-09 — End: 2018-04-11
  Administered 2018-04-09 – 2018-04-11 (×3): 40 mg via ORAL
  Filled 2018-04-09 (×3): qty 1

## 2018-04-09 MED ORDER — HEPARIN SODIUM (PORCINE) 5000 UNIT/ML IJ SOLN
5000.00 [IU] | Freq: Three times a day (TID) | INTRAMUSCULAR | Status: DC
Start: 2018-04-09 — End: 2018-04-11
  Administered 2018-04-09 – 2018-04-11 (×7): 5000 [IU] via SUBCUTANEOUS
  Filled 2018-04-09 (×7): qty 1

## 2018-04-09 MED ORDER — SENNOSIDES-DOCUSATE SODIUM 8.6-50 MG PO TABS
2.0000 | ORAL_TABLET | Freq: Two times a day (BID) | ORAL | Status: DC
Start: 2018-04-09 — End: 2018-04-11
  Administered 2018-04-09 – 2018-04-11 (×5): 2 via ORAL
  Filled 2018-04-09 (×2): qty 2
  Filled 2018-04-09: qty 1
  Filled 2018-04-09 (×2): qty 2

## 2018-04-09 MED ORDER — OXYCODONE-ACETAMINOPHEN 5-325 MG PO TABS
1.00 | ORAL_TABLET | Freq: Once | ORAL | Status: AC
Start: 2018-04-09 — End: 2018-04-09
  Administered 2018-04-09: 04:00:00 1 via ORAL
  Filled 2018-04-09: qty 1

## 2018-04-09 MED ORDER — ONDANSETRON HCL 4 MG/2ML IJ SOLN
4.00 mg | Freq: Four times a day (QID) | INTRAMUSCULAR | Status: DC | PRN
Start: 2018-04-09 — End: 2018-04-11

## 2018-04-09 MED ORDER — ALBUTEROL-IPRATROPIUM 2.5-0.5 (3) MG/3ML IN SOLN
3.00 mL | Freq: Four times a day (QID) | RESPIRATORY_TRACT | Status: DC | PRN
Start: 2018-04-09 — End: 2018-04-11

## 2018-04-09 MED ORDER — PREDNISONE 20 MG PO TABS
20.00 mg | ORAL_TABLET | Freq: Once | ORAL | Status: AC
Start: 2018-04-09 — End: 2018-04-09
  Administered 2018-04-09: 15:00:00 20 mg via ORAL
  Filled 2018-04-09: qty 1

## 2018-04-09 MED ORDER — INSULIN GLARGINE 100 UNIT/ML SC SOLN
10.00 [IU] | Freq: Every evening | SUBCUTANEOUS | Status: DC
Start: 2018-04-09 — End: 2018-04-10
  Administered 2018-04-09: 22:00:00 10 [IU] via SUBCUTANEOUS
  Filled 2018-04-09: qty 10

## 2018-04-09 MED ORDER — ENOXAPARIN SODIUM 40 MG/0.4ML SC SOLN
40.00 mg | Freq: Every day | SUBCUTANEOUS | Status: DC
Start: 2018-04-09 — End: 2018-04-09

## 2018-04-09 MED ORDER — PREDNISONE 20 MG PO TABS
30.0000 mg | ORAL_TABLET | Freq: Every day | ORAL | Status: DC
Start: 2018-04-10 — End: 2018-04-11
  Administered 2018-04-10 – 2018-04-11 (×2): 30 mg via ORAL
  Filled 2018-04-09 (×2): qty 2

## 2018-04-09 MED ORDER — GLUCAGON 1 MG IJ SOLR (WRAP)
1.00 mg | INTRAMUSCULAR | Status: DC | PRN
Start: 2018-04-09 — End: 2018-04-11

## 2018-04-09 MED ORDER — ATORVASTATIN CALCIUM 40 MG PO TABS
40.0000 mg | ORAL_TABLET | Freq: Every evening | ORAL | Status: DC
Start: 2018-04-09 — End: 2018-04-11
  Administered 2018-04-09 – 2018-04-10 (×2): 40 mg via ORAL
  Filled 2018-04-09 (×2): qty 1

## 2018-04-09 MED ORDER — GABAPENTIN 100 MG PO CAPS
100.00 mg | ORAL_CAPSULE | Freq: Once | ORAL | Status: AC
Start: 2018-04-09 — End: 2018-04-09
  Administered 2018-04-09: 07:00:00 100 mg via ORAL
  Filled 2018-04-09: qty 1

## 2018-04-09 MED ORDER — TRAMADOL HCL 50 MG PO TABS
50.00 mg | ORAL_TABLET | Freq: Once | ORAL | Status: AC
Start: 2018-04-09 — End: 2018-04-09
  Administered 2018-04-09: 50 mg via ORAL
  Filled 2018-04-09: qty 1

## 2018-04-09 MED ORDER — DEXTROSE 50 % IV SOLN
12.50 g | INTRAVENOUS | Status: DC | PRN
Start: 2018-04-09 — End: 2018-04-11

## 2018-04-09 MED ORDER — SODIUM BICARBONATE 650 MG PO TABS
1300.0000 mg | ORAL_TABLET | Freq: Two times a day (BID) | ORAL | Status: DC
Start: 2018-04-09 — End: 2018-04-11
  Administered 2018-04-09 – 2018-04-11 (×5): 1300 mg via ORAL
  Filled 2018-04-09 (×5): qty 2

## 2018-04-09 MED ORDER — ONDANSETRON 4 MG PO TBDP
4.00 mg | ORAL_TABLET | Freq: Four times a day (QID) | ORAL | Status: DC | PRN
Start: 2018-04-09 — End: 2018-04-11

## 2018-04-09 MED ORDER — GLUCOSE 40 % PO GEL
15.00 g | ORAL | Status: DC | PRN
Start: 2018-04-09 — End: 2018-04-11

## 2018-04-09 MED ORDER — FUROSEMIDE 40 MG PO TABS
40.0000 mg | ORAL_TABLET | Freq: Every day | ORAL | Status: DC
Start: 2018-04-09 — End: 2018-04-11
  Administered 2018-04-09 – 2018-04-11 (×3): 40 mg via ORAL
  Filled 2018-04-09 (×3): qty 1

## 2018-04-09 MED ORDER — COLCHICINE 0.6 MG PO TABS
0.60 mg | ORAL_TABLET | Freq: Once | ORAL | Status: AC
Start: 2018-04-09 — End: 2018-04-09
  Administered 2018-04-09: 15:00:00 0.6 mg via ORAL
  Filled 2018-04-09: qty 1

## 2018-04-09 MED ORDER — ALLOPURINOL 100 MG PO TABS
100.0000 mg | ORAL_TABLET | Freq: Every day | ORAL | Status: DC
Start: 2018-04-09 — End: 2018-04-09
  Administered 2018-04-09: 10:00:00 100 mg via ORAL
  Filled 2018-04-09: qty 1

## 2018-04-09 MED ORDER — INSULIN LISPRO 100 UNIT/ML SC SOLN
1.00 [IU] | Freq: Three times a day (TID) | SUBCUTANEOUS | Status: DC
Start: 2018-04-09 — End: 2018-04-11
  Administered 2018-04-09: 17:00:00 2 [IU] via SUBCUTANEOUS
  Administered 2018-04-10: 18:00:00 4 [IU] via SUBCUTANEOUS
  Administered 2018-04-10: 10:00:00 2 [IU] via SUBCUTANEOUS
  Administered 2018-04-10: 12:00:00 4 [IU] via SUBCUTANEOUS
  Administered 2018-04-11: 08:00:00 2 [IU] via SUBCUTANEOUS
  Filled 2018-04-09 (×2): qty 6
  Filled 2018-04-09 (×2): qty 12
  Filled 2018-04-09: qty 6

## 2018-04-09 MED ORDER — METOPROLOL SUCCINATE ER 25 MG PO TB24
12.50 mg | ORAL_TABLET | Freq: Every day | ORAL | Status: DC
Start: 2018-04-09 — End: 2018-04-11
  Administered 2018-04-09 – 2018-04-11 (×3): 12.5 mg via ORAL
  Filled 2018-04-09 (×4): qty 1

## 2018-04-09 MED ORDER — PREDNISONE 20 MG PO TABS
10.0000 mg | ORAL_TABLET | Freq: Every day | ORAL | Status: DC
Start: 2018-04-09 — End: 2018-04-09
  Administered 2018-04-09: 10:00:00 10 mg via ORAL
  Filled 2018-04-09: qty 1

## 2018-04-09 NOTE — UM Notes (Signed)
85 YEAR OLD MALE CAME TO [facility] ER WITH C/O ACUTE WORSENING PAIN IN KNEES. PAIN RADIATES TO GROIN THAT IS CHRONIC BUT BECAME ACUTE WORSE THE PAST 3 DAYS. IN ER, HE DESATED TO 80s ON RA.    VS- BP- 133/63, P- 80, R- 16, T- 99.4  LOP 8-10/10    EKG-   AV dual-paced rhythm  ABNORMAL ECG  WHEN COMPARED WITH ECG OF 07-Jan-2018 19:29,  VENTRICULAR RATE HAS DECREASED BY 10 BPM        LABS- H/H- 11/35, BUN 45, CR 2.4    CXR- CARDIOMEGALY  CT HEAD- NO ACUTE PROCESS  CT CHEST- NO ACUTE PROCESS    PMH- CHF, PULMONARY EDEMA, ARF, GOUT, CKD, HLD,HTN, NSTEMI, OSTEOARTHRITIS, DM, POLYMYALGIA RHEUMATICA, ABD SURGERY, CHOLE, PACER    PLAN- ADMIT TO OBSERVATION CLASS 04/09/18 0416    Consult to case management  Previously was given prednisone taper which helped with Knee pain  XR of knees ordered and pending  May benefit from ortho consult for knee effusion drainage after XR  Given Oxycodone and Tramadol in ER for pain  Discussed with family about home health care, they currently have [place] in place  Continue home medications  Insulin lantus 10u and sliding scale  Started on 10mg  prednisone  Tramadol ordered 50mg  PRN    CHANGED TO INPATIENT CLASS 04/09/18 1249 R/T     Picture c/w acute on chr gout of bilat knees  Effusions noted  Does not seem like infection clinically - will hold on arthrocentesis  Increase prednisone to 30  Colchicine 0.6 x 1  PT eval  Rheum eval tomorrow - needs better outpatient management (uric acid level is > 10).  Admit to inpt given that he will need 2 MN of care, ie monitroing for improvement, PT eval, adjustment in insulin with higher dose prednisone.      Admit to Inpatient (Order #[id number]) on 04/09/18    Musculoskeletal Disease [facility] - Clinical Indications for Admission to [place] RN, BSN  Utilization Review Case Manager  [place]  [facility] Case Mgmt.  [phone]- VM-Secure    NOTES TO REVIEWER:    This clinical review is based on/compiled from documentation provided by the  treatment team within the patients medical record.

## 2018-04-09 NOTE — Plan of Care (Signed)
Brief visit  Picture c/w acute on chr gout of bilat knees  Effusions noted  Does not seem like infection clinically - will hold on arthrocentesis  Increase prednisone to 30  Colchicine 0.6 x 1  PT eval  Rheum eval tomorrow - needs better outpatient management (uric acid level is > 10).  Admit to inpt given that he will need 2 MN of care, ie monitroing for improvement, PT eval, adjustment in insulin with higher dose prednisone.

## 2018-04-09 NOTE — ED Notes (Signed)
NURSING NOTE FOR THE RECEIVING INPATIENT NURSE     ED NURSE Tawny Asal [id number]   ED CHARGE NURSE [phone]   ADMISSION INFORMATION   Lisa Blakeman is a 85 y.o. male admitted with a diagnosis of:    1. Other chronic pain    2. Renal failure, unspecified chronicity       Isolation:  None  Place of residence / Living situation:  Home/Family Care   NURSING CARE   Mental Status alert and oriented   ADL ADLs:            Dependent with ADLs  Ambulation:  Ambulates with: walker   Pertinent Information  and Safety Concerns Pt being admitted for fatigue, unable to bear weight on bilateral knees due to increase in pain. Family is currently providing maximal assistance to pt for ADLs. Pt will require care management and/or SW consults for assistance for care at home      VITAL SIGNS     Time of Last Set of Vitals 0430   Temperature 99.4   BP 138/69   Pulse 82   Respirations 16   Pulse OX 95% RA        IV LINES     Peripheral IV 04/08/18 Left Antecubital (Active)   Placement Date/Time: 04/08/18 2214   IV Change Due: 04/15/18  Size (Gauge): 18 G  Orientation: Left  Location: Antecubital  Site Prep: Chlorhexidine   Patient Prep: Comfort measures  Local Anesthetic: None  Inserted by: mike  Insertion attempts: 1  Pa...        LAB RESULTS     Labs Reviewed   CBC AND DIFFERENTIAL - Abnormal; Notable for the following components:       Result Value    Hgb 11.1 (*)     Hematocrit 35.0 (*)     RBC 3.78 (*)     Abs Eos Automated 0.55 (*)     All other components within normal limits    Narrative:     Replace urinary catheter prior to obtaining the urine culture  if it has been in place for greater than or equal to 14  days:->N/A No Foley  Indications for U/A Reflex to Micro - Reflex to  Culture:->Suprapubic Pain/Tenderness or Dysuria   PT AND APTT - Abnormal; Notable for the following components:    PT INR 1.2 (*)     PTT 47 (*)     All other components within normal limits    Narrative:     Replace urinary catheter prior to obtaining  the urine culture  if it has been in place for greater than or equal to 14  days:->N/A No Foley  Indications for U/A Reflex to Micro - Reflex to  Culture:->Suprapubic Pain/Tenderness or Dysuria   COMPREHENSIVE METABOLIC PANEL - Abnormal; Notable for the following components:    Glucose 148 (*)     BUN 45.0 (*)     Creatinine 2.4 (*)     CO2 19 (*)     All other components within normal limits    Narrative:     Replace urinary catheter prior to obtaining the urine culture  if it has been in place for greater than or equal to 14  days:->N/A No Foley  Indications for U/A Reflex to Micro - Reflex to  Culture:->Suprapubic Pain/Tenderness or Dysuria   URINALYSIS REFLEX TO MICROSCOPIC EXAM - REFLEX TO CULTURE - Abnormal; Notable for the following components:    Protein, UR 100 (*)  Ketones UA 20 (*)     Blood, UA Small (*)     All other components within normal limits    Narrative:     Replace urinary catheter prior to obtaining the urine culture  if it has been in place for greater than or equal to 14  days:->N/A No Foley  Indications for U/A Reflex to Micro - Reflex to  Culture:->Suprapubic Pain/Tenderness or Dysuria   LIPASE    Narrative:     Replace urinary catheter prior to obtaining the urine culture  if it has been in place for greater than or equal to 14  days:->N/A No Foley  Indications for U/A Reflex to Micro - Reflex to  Culture:->Suprapubic Pain/Tenderness or Dysuria   GFR    Narrative:     Replace urinary catheter prior to obtaining the urine culture  if it has been in place for greater than or equal to 14  days:->N/A No Foley  Indications for U/A Reflex to Micro - Reflex to  Culture:->Suprapubic Pain/Tenderness or Dysuria        PATIENT BELONGINGS   Pt has clothing from home- shirt, pants, socks, shoes

## 2018-04-09 NOTE — Plan of Care (Signed)
Admit after midnight, patient seen & examined this AM, family member at bedside. Patient continues to report bilateral knee pain. Please refer to full H&P from Dr. Humphrey Rolls earlier this AM.     Comfortable appearing, appreciate bilateral knee effusions, knees warm to touch, gait not assessed.     Assessment/ Plan:    1. Bilateral knee swelling, ambulatory dysfunction; concern for gout flare   - Increased Prednisone, continue concurrent PPI   - Colchicine 0.6 mg x 1 dose (renal dosing)   - Discussed with Ortho, recommend Rheum consulted (ordered)   - Uric Acid 10.3, CRP 17, ESR 88   - Doppler negative, Bilateral XRs show effusion  2. CKD   - SCr 2.8, essentially at baseline CrCl   - Renally dose medications, avoiding nephrotoxic agents.     Ricki Miller [place], [place]  [facility]  [phone]

## 2018-04-09 NOTE — Progress Notes (Signed)
CM met with pt's family to discuss barriers at home and provide resources. Pt has home health services 7 days/week. Pt lives with his family in a two-story home and is unable to get up/down stairs without assistance. Pt's family is requesting assistance for a motorized wheelchair to transfer pt up/down stairs. CM explained that this service isn't provided through [facility]. CM provided family with information on [place] [facility] and specific programs to help with home improvement. Pt's family is also encouraged to discuss these needs with home health agency.    Vertell Limber, LCSW  ED Social Worker/Case Manager  [phone]

## 2018-04-09 NOTE — Progress Note - Problem Oriented Charting Notewrit (Signed)
NURSING ADMISSION NOTE    Date Time: 04/09/18 7:31 PM  Patient Name: [facility]  Attending Physician: Shirlean Schlein, MD    Date of Admission:   04/08/2018    Reason for Admission:   Other chronic pain [G89.29]  Renal failure, unspecified chronicity [N19]    Admitted from:   ED  Nursing Note:   Patient AOx4, Khmer speaking, and VSS. On tele, NSR, occasional PVC's and BBB, confirmed with [facility]. Oriented patient to the floor and POC. Bed to low position. Call bell within reach.     Patient scores as low/mod/high: High Falls risk. Patient bedrest with: 2-person assist.  Bed alarm on and floor mats in place. Patient belongings reviewed.. Patient skin intact, old scares, 4 eyes in 4 hours with RN Hedwig Morton RN. Patient reports pain as 3/10, Fall interventions in place.    Active Lines:    Patient Lines/Drains/Airways Status    Active Lines, Drains and Airways     Name:   Placement date:   Placement time:   Site:   Days:    Peripheral IV 04/08/18 Left Antecubital   04/08/18    2214    Antecubital   less than 1                  Vitals:    04/09/18 0948 04/09/18 1129 04/09/18 1517 04/09/18 1916   BP: 121/61 121/61 124/73 125/62   Pulse: 79 79 68 68   Resp:  16  16   Temp:  98.7 F (37.1 C) 98.4 F (36.9 C) 97.9 F (36.6 C)   TempSrc:   Oral Oral   SpO2:   96% 97%   Weight:  72.1 kg (159 lb)     Height:  1.575 m (5\' 2" )

## 2018-04-09 NOTE — ED Notes (Signed)
Bed: N 29  Expected date:   Expected time:   Means of arrival:   Comments:  18 here

## 2018-04-09 NOTE — H&P (Signed)
ADMISSION HISTORY AND PHYSICAL EXAM    Date Time: 04/09/18 6:13 AM  Patient Name: [facility]  Attending Physician: Roxanne Gates*  Primary Care Physician: Danella Sensing, MD    CC: Unable to ambulate        History of Presenting Illness:   Marco Bruce is a 85 y.o. male w history of Dm2, CKD, HTN, CHF, Pacemaker, Nstemi, chronic pain, Gout who presents with acute worsening of pain in his knees.  Per family at bedside his pain in his knees has been radiating up into his groin, this is been ongoing for the past year but acutely worsened over the past 3 days.  He normally ambulates using a seated walker, but now has been carried around due to difficulty ambulating secondary to pain.  He has great difficulty using the stairs at home due to the pain in his knees.     They deny any chest pain or shortness of breath, but has had some nausea after receiving Percocet in the ER on an empty stomach.  Was noted that he desaturated to the 80s on room air in the ER.    Review of systems: All other systems were reviewed and are negative except: Diffuse knee pain, groin pain, weakness, fatigue, poor appetite    Past Medical History:     Past Medical History:   Diagnosis Date    Acute diastolic heart failure 14/43/1540    Acute on chronic diastolic congestive heart failure 08/13/2016    Acute pulmonary edema 05/26/2015    ARF (acute renal failure) 05/26/2015    Chronic diastolic heart failure 08/67/6195    Chronic gout     Chronic pain syndrome 07/07/2015    CKD (chronic kidney disease) stage 4, GFR 15-29 ml/min     2018 baseline creatinine in EPIC is 1.5-2.3.    Congestive heart failure (CHF)     EF 40% 5/18 echo, on lasix    Current chronic use of systemic steroids     For Polymyalgia rheumatica    Essential hypertension     Mixed hyperlipidemia     NSTEMI (non-ST elevated myocardial infarction) 08/13/2016    Osteoarthritis of both knees     Peripheral edema 06/16/2015    Pneumonia due to  infectious organism, unspecified laterality, unspecified part of lung 06/25/2015    Polymyalgia rheumatica     chronic steroids    Renovascular hypertension 08/04/2015    Second degree heart block by electrocardiogram (ECG) 04/27/2015    Type 2 diabetes mellitus with complication, with long-term current use of insulin     12/01/2016 Hemoglobin A1c 8.2%       Past Surgical History:     Past Surgical History:   Procedure Laterality Date    ABDOMINAL SURGERY      CHOLECYSTECTOMY      PACEMAKER Left 2017       Family History:     Family History   Problem Relation Age of Onset    Hypertension Father        Social History:     Social History     Tobacco Use   Smoking Status Never Smoker   Smokeless Tobacco Never Used     Social History     Substance and Sexual Activity   Alcohol Use No    Comment: former alcoholic     Social History     Substance and Sexual Activity   Drug Use No       Allergies:  No Known Allergies    Medications:     Home Medications     Med List Status:  In Progress Set By: Winnifred Friar, RN at 04/08/2018 11:25 PM                albuterol-ipratropium (DUO-NEB) 2.5-0.5(3) mg/3 mL nebulizer     Take 3 mLs by nebulization 2 (two) times daily as needed (SOB)     allopurinol (ZYLOPRIM) 100 MG tablet     Take 1 tablet (100 mg total) by mouth daily     aspirin 81 MG chewable tablet     Chew 1 tablet (81 mg total) by mouth daily     atorvastatin (LIPITOR) 40 MG tablet     Take 1 tablet (40 mg total) by mouth nightly     cetirizine (ZYRTEC) 10 MG tablet     Take 1 tablet (10 mg total) by mouth daily as needed for Rhinitis.     dextromethorphan-guaiFENesin (MUCINEX DM) 30-600 MG per 12 hr tablet     Take 1 tablet by mouth 2 (two) times daily as needed (cough)     furosemide (LASIX) 40 MG tablet     Take 1 tablet (40 mg total) by mouth daily     gabapentin (NEURONTIN) 100 MG capsule     TAKE ONE CAPSULE BY MOUTH EVERY 8 HOURS     insulin glargine (LANTUS SOLOSTAR) 100 UNIT/ML injection pen      Inject 10 Units into the skin nightly     Insulin Pen Needle (B-D UF III MINI PEN NEEDLES) 31G X 5 MM Misc     Use with insulin     linaGLIPtin 5 MG Tab     Take 1 tablet (5 mg total) by mouth daily     metoprolol succinate XL (TOPROL-XL) 25 MG 24 hr tablet     Take 0.5 tablets (12.5 mg total) by mouth daily     pantoprazole (PROTONIX) 40 MG tablet     Take 1 tablet (40 mg total) by mouth nightly     predniSONE (DELTASONE) 10 MG tablet     Take 30mg  01/11/2018 morning with food (3 tabs), 20mg  for 3 more days (2 tabs), then 10mg  for 3 more days (1 tab)     Probiotic Product (RISAQUAD PO)     Take 1 capsule by mouth daily.     senna-docusate (PERICOLACE) 8.6-50 MG per tablet     Take 2 tablets by mouth 2 (two) times daily     sodium bicarbonate 650 MG tablet     Take 2 tablets (1,300 mg total) by mouth 2 (two) times daily          Physical Exam:     Patient Vitals for the past 24 hrs:   BP Temp Temp src Pulse Resp SpO2 Height Weight   04/09/18 0430 138/69   82 16 95 %     04/09/18 0416 135/66   81  94 %     04/09/18 0127 146/70   82  94 %     04/09/18 0040    80  96 %     04/09/18 0031 156/73   78       04/09/18 0026      94 %     04/09/18 0000 138/78   82  93 %     04/08/18 2332 166/77   77  94 %     04/08/18 2004 133/63 99.4 F (37.4 C) Oral  70 17 96 % 1.575 m (5\' 2" ) 72.1 kg (159 lb)   04/08/18 1956    80 18 94 %       Body mass index is 29.08 kg/m.  No intake or output data in the 24 hours ending 04/09/18 0613    General: awake, alert, oriented x 3; no acute distress   HEENT: perrla, eomi, sclera anicteric, oropharynx clear without lesions, mucous membranes moist  Neck: supple, no lymphadenopathy, no JVD  Cardiovascular: regular rate and rhythm, no murmurs, rubs or gallops  Lungs: mild bibasilar crackles  Abdomen: soft, non-tender, non-distended; no palpable masses, normoactive bowel sounds, no rebound or guarding  Extremities: no clubbing. BL knees tender to  palpation, Left knee effusion > R knee  Neuro: cranial nerves grossly intact, strength 5/5 in upper and lower extremities, sensation intact  Skin: no rashes or lesions noted    Labs/Imaging:     Results     Procedure Component Value Units Date/Time    UA Reflex to Micro - Reflex to Culture [[id number]]  (Abnormal) Collected:  04/09/18 0123     Updated:  04/09/18 0156     Urine Type Urine, Clean Ca     Color, UA Yellow     Clarity, UA Clear     Specific Gravity UA 1.017     Urine pH 5.0     Leukocyte Esterase, UA Negative     Nitrite, UA Negative     Protein, UR 100     Glucose, UA Negative     Ketones UA 20     Urobilinogen, UA Normal mg/dL      Bilirubin, UA Negative     Blood, UA Small     RBC, UA 0 - 2 /hpf      WBC, UA 0 - 5 /hpf     Narrative:       Replace urinary catheter prior to obtaining the urine culture  if it has been in place for greater than or equal to 14  days:->N/A No Foley  Indications for U/A Reflex to Micro - Reflex to  Culture:->Suprapubic Pain/Tenderness or Dysuria    Lipase [[id number]] Collected:  04/08/18 2215    Specimen:  Blood Updated:  04/08/18 2318     Lipase 28 U/L     Narrative:       Replace urinary catheter prior to obtaining the urine culture  if it has been in place for greater than or equal to 14  days:->N/A No Foley  Indications for U/A Reflex to Micro - Reflex to  Culture:->Suprapubic Pain/Tenderness or Dysuria    GFR [[id number]] Collected:  04/08/18 2215     Updated:  04/08/18 2318     EGFR 25.6    Narrative:       Replace urinary catheter prior to obtaining the urine culture  if it has been in place for greater than or equal to 14  days:->N/A No Foley  Indications for U/A Reflex to Micro - Reflex to  Culture:->Suprapubic Pain/Tenderness or Dysuria    Comprehensive metabolic panel [[id number]]  (Abnormal) Collected:  04/08/18 2215    Specimen:  Blood Updated:  04/08/18 2318     Glucose 148 mg/dL      BUN 45.0 mg/dL      Creatinine 2.4 mg/dL      Sodium 139 mEq/L      Potassium  4.6 mEq/L      Chloride 105 mEq/L      CO2 19 mEq/L  Calcium 9.7 mg/dL      Protein, Total 6.6 g/dL      Albumin 3.9 g/dL      AST (SGOT) 19 U/L      ALT <6 U/L      Alkaline Phosphatase 54 U/L      Bilirubin, Total 1.0 mg/dL      Globulin 2.7 g/dL      Albumin/Globulin Ratio 1.4    Narrative:       Replace urinary catheter prior to obtaining the urine culture  if it has been in place for greater than or equal to 14  days:->N/A No Foley  Indications for U/A Reflex to Micro - Reflex to  Culture:->Suprapubic Pain/Tenderness or Dysuria    PT/APTT [[id number]]  (Abnormal) Collected:  04/08/18 2215     Updated:  04/08/18 2318     PT 14.8 sec      PT INR 1.2     PTT 47 sec     Narrative:       Replace urinary catheter prior to obtaining the urine culture  if it has been in place for greater than or equal to 14  days:->N/A No Foley  Indications for U/A Reflex to Micro - Reflex to  Culture:->Suprapubic Pain/Tenderness or Dysuria    CBC and differential [[id number]]  (Abnormal) Collected:  04/08/18 2215    Specimen:  Blood Updated:  04/08/18 2307     WBC 9.34 x10 3/uL      Hgb 11.1 g/dL      Hematocrit 35.0 %      Platelets 175 x10 3/uL      RBC 3.78 x10 6/uL      MCV 92.6 fL      MCH 29.4 pg      MCHC 31.7 g/dL      RDW 15 %      MPV 10.3 fL      Neutrophils 64.5 %      Lymphocytes Automated 20.3 %      Monocytes 8.4 %      Eosinophils Automated 5.9 %      Basophils Automated 0.5 %      Immature Granulocyte 0.4 %      Nucleated RBC 0.0 /100 WBC      Neutrophils Absolute 6.02 x10 3/uL      Abs Lymph Automated 1.90 x10 3/uL      Abs Mono Automated 0.78 x10 3/uL      Abs Eos Automated 0.55 x10 3/uL      Absolute Baso Automated 0.05 x10 3/uL      Absolute Immature Granulocyte 0.04 x10 3/uL      Absolute NRBC 0.00 x10 3/uL     Narrative:       Replace urinary catheter prior to obtaining the urine culture  if it has been in place for greater than or equal to 14  days:->N/A No Foley  Indications for U/A Reflex to Micro - Reflex  to  Culture:->Suprapubic Pain/Tenderness or Dysuria          Imaging reviewed:  Ct Head Wo Contrast    Result Date: 04/08/2018   No acute intracranial abnormality is seen.     [name], MD 04/08/2018 9:52 PM    Ct Chest Without Contrast    Result Date: 04/09/2018  No acute abnormality. Nonacute findings detailed above. Loyal Buba, MD 04/09/2018 1:26 AM    Xr Chest  Ap Portable    Result Date: 04/08/2018    Cardiomegaly.  Virl Diamond, MD 04/08/2018 8:47 PM    Xr Pelvis Limited 1 Or 2 Views    Result Date: 04/09/2018   No acute abnormality. Loyal Buba, MD 04/09/2018 1:48 AM    US Venous Duplex Doppler Leg Bilateral    Result Date: 04/09/2018   No deep venous thrombosis of the BILATERAL lower extremities. Lytle Michaels, MD 04/09/2018 2:46 AM      Signed by: Lesle Chris, MD   OK:[id number], Philis Nettle, MD      Assessment and plan     85 y.o. male w history of Dm2, CKD, HTN, CHF, Pacemaker, Nstemi, chronic pain, Gout who presents with acute worsening of pain in his knees.     Failure to ambulate  Acute Knee pain  Chronic pain   Failure to thrive  Anemia  DM2  CKD  HTN  HLD  CHF  Pacer in place      Place to observation  Consult to case management  Previously was given prednisone taper which helped with Knee pain  XR of knees ordered and pending  May benefit from ortho consult for knee effusion drainage after XR  Given Oxycodone and Tramadol in ER for pain  Discussed with family about home health care, they currently have [place] in place  Continue home medications  Insulin lantus 10u and sliding scale  Started on 10mg  prednisone  Tramadol ordered 50mg  PRN        DVT ppx: / Heparin / SCD  Foley:  Code Status: NO CPR - SUPPORT OK          Disposition:    Service status:  Observation due to: failure to ambulate  Anticipated discharge needs:         Contact Information:  Monday to Friday 7am-6pm:   Primary Contact- Medicine NP/PA- Specific hours/contact information per Sticky Note   Secondary- Medicine Attending   [facility] Hospitalist at pager [phone]  Monday to Friday after 6pm-   Primary Contact- On-Call Hospitalist at pager [place] Attending  Secondary- On-Call Hospitalist at pager [phone]

## 2018-04-09 NOTE — ED Notes (Signed)
Urinal at bedside.  Patient and family informed that urine sample needed.

## 2018-04-09 NOTE — Plan of Care (Signed)
Problem: Moderate/High Fall Risk Score >5  Goal: Patient will remain free of falls  Outcome: Progressing  Flowsheets (Taken 04/09/2018 0900)  Moderate Risk (6-13): MOD-(VH Only) Yellow slippers;MOD-(VH Only) Yellow "Fall Risk" signage;MOD-([facility] Only) Apply yellow "Fall Risk" arm band     Problem: Safety  Goal: Patient will be free from injury during hospitalization  Outcome: Progressing  Flowsheets (Taken 04/09/2018 1118)  Patient will be free from injury during hospitalization : Provide and maintain safe environment; Use appropriate transfer methods; Assess patient's risk for falls and implement fall prevention plan of care per policy; Ensure appropriate safety devices are available at the bedside; Include patient/ family/ care giver in decisions related to safety  Goal: Patient will be free from infection during hospitalization  Outcome: Progressing  Flowsheets (Taken 04/09/2018 1118)  Free from Infection during hospitalization: Monitor all insertion sites (i.e. indwelling lines, tubes, urinary catheters, and drains); Monitor lab/diagnostic results; Encourage patient and family to use good hand hygiene technique; Assess and monitor for signs and symptoms of infection     Problem: Pain  Goal: Pain at adequate level as identified by patient  Outcome: Progressing  Flowsheets (Taken 04/09/2018 1118)  Pain at adequate level as identified by patient: Identify patient comfort function goal; Assess for risk of opioid induced respiratory depression, including snoring/sleep apnea. Alert healthcare team of risk factors identified.; Assess pain on admission, during daily assessment and/or before any "as needed" intervention(s); Evaluate if patient comfort function goal is met     Problem: Side Effects from Pain Analgesia  Goal: Patient will experience minimal side effects of analgesic therapy  Outcome: Progressing  Flowsheets (Taken 04/09/2018 1118)  Patient will experience minimal side effects of analgesic therapy: Monitor/assess  patient's respiratory status (RR depth, effort, breath sounds); Assess for changes in cognitive function; Prevent/manage side effects per LIP orders (i.e. nausea, vomiting, pruritus, constipation, urinary retention, etc.)     Problem: Discharge Barriers  Goal: Patient will be discharged home or other facility with appropriate resources  Outcome: Progressing  Flowsheets (Taken 04/09/2018 1118)  Discharge to home or other facility with appropriate resources: Provide information on available health resources; Provide appropriate patient education     Problem: Psychosocial and Spiritual Needs  Goal: Demonstrates ability to cope with hospitalization/illness  Outcome: Progressing  Flowsheets (Taken 04/09/2018 1118)  Demonstrates ability to cope with hospitalizations/illness: Encourage verbalization of feelings/concerns/expectations; Provide quiet environment; Assist patient to identify own strengths and abilities; Encourage patient to set small goals for self; Encourage participation in diversional activity     Problem: Compromised Tissue integrity  Goal: Damaged tissue is healing and protected  Outcome: Progressing  Flowsheets (Taken 04/09/2018 1118)  Damaged tissue is healing and protected : Monitor/assess Braden scale every shift; Provide wound care per wound care algorithm; Increase activity as tolerated/progressive mobility  Goal: Nutritional status is improving  Outcome: Progressing  Flowsheets (Taken 04/09/2018 1118)  Nutritional status is improving: Assist patient with eating; Allow adequate time for meals; Encourage patient to take dietary supplement(s) as ordered

## 2018-04-10 DIAGNOSIS — M25461 Effusion, right knee: Secondary | ICD-10-CM

## 2018-04-10 DIAGNOSIS — I10 Essential (primary) hypertension: Secondary | ICD-10-CM

## 2018-04-10 DIAGNOSIS — Z7952 Long term (current) use of systemic steroids: Secondary | ICD-10-CM

## 2018-04-10 DIAGNOSIS — M109 Gout, unspecified: Principal | ICD-10-CM

## 2018-04-10 DIAGNOSIS — M25462 Effusion, left knee: Secondary | ICD-10-CM

## 2018-04-10 DIAGNOSIS — N184 Chronic kidney disease, stage 4 (severe): Secondary | ICD-10-CM

## 2018-04-10 LAB — BASIC METABOLIC PANEL
BUN: 69 mg/dL — ABNORMAL HIGH (ref 9.0–28.0)
CO2: 22 mEq/L (ref 22–29)
Calcium: 9 mg/dL (ref 7.9–10.2)
Chloride: 104 mEq/L (ref 100–111)
Creatinine: 3.3 mg/dL — ABNORMAL HIGH (ref 0.7–1.3)
Glucose: 270 mg/dL — ABNORMAL HIGH (ref 70–100)
Potassium: 4.8 mEq/L (ref 3.5–5.1)
Sodium: 136 mEq/L (ref 136–145)

## 2018-04-10 LAB — GFR: EGFR: 17.7

## 2018-04-10 LAB — GLUCOSE WHOLE BLOOD - POCT
Whole Blood Glucose POCT: 247 mg/dL — ABNORMAL HIGH (ref 70–100)
Whole Blood Glucose POCT: 304 mg/dL — ABNORMAL HIGH (ref 70–100)
Whole Blood Glucose POCT: 349 mg/dL — ABNORMAL HIGH (ref 70–100)
Whole Blood Glucose POCT: 306 mg/dL — ABNORMAL HIGH (ref 70–100)

## 2018-04-10 MED ORDER — INSULIN GLARGINE 100 UNIT/ML SC SOLN
15.00 [IU] | Freq: Every evening | SUBCUTANEOUS | Status: DC
Start: 2018-04-10 — End: 2018-04-11
  Administered 2018-04-10: 22:00:00 15 [IU] via SUBCUTANEOUS
  Filled 2018-04-10: qty 15

## 2018-04-10 MED ORDER — ALLOPURINOL 100 MG PO TABS
100.0000 mg | ORAL_TABLET | Freq: Every day | ORAL | Status: DC
Start: 2018-04-10 — End: 2018-04-11
  Administered 2018-04-10 – 2018-04-11 (×2): 100 mg via ORAL
  Filled 2018-04-10 (×2): qty 1

## 2018-04-10 MED ORDER — PLASMA-LYTE A IV INFUSION
INTRAVENOUS | Status: DC
Start: 2018-04-10 — End: 2018-04-11

## 2018-04-10 MED ORDER — PLASMA-LYTE A IV INFUSION
INTRAVENOUS | Status: DC
Start: 2018-04-10 — End: 2018-04-10

## 2018-04-10 NOTE — Progress Notes (Addendum)
Home Health Referral          Referral from Chyrel Masson (Case Manager) for home health care upon discharge.    By [employer], the patient has the right to freely choose a home care provider.  Arrangements have been made with:     A company of the patients choosing. We have supplied the patient with a listing of providers in your area who asked to be included and participate in Medicare.   [facility], formerly [facility], a home care agency that provides adult home care services and participates in Medicare   The preferred provider of your insurance company. Choosing a home care provider other than your insurance company's preferred provider may affect your insurance coverage.      Home Health Discharge Information     Your doctor has ordered Skilled Nursing, Physical Therapy and Occupational Therapy in-home service(s) for you while you recuperate at home, to assist you in the transition from hospital to home.      The agency that you or your representative chose to provide the service:  Name of [place] Placement: [facility] [phone]       The above services were set up by:  Oliver Barre  (Industry)   Phone    [phone]                                         Additional comments: pt declines PT/OT. Wants SN only.    IF YOU HAVE NOT HEARD FROM YOUR HOME YOUR HOME HEALTH AGENCY WITHIN 24-48 HOURS AFTER DISCHARGE PLEASE CALL YOUR AGENCY TO ARRANGE A TIME FOR YOUR FIRST VISIT. FOR ANY SCHEDULING CONCERNS OR QUESTIONS RELATED TO HOME HEALTH, SUCH AS TIME OR DATE PLEASE CONTACT YOUR HOME HEALTH AGENCY AT THE NUMBER LISTED ABOVE.    Signed by: Oliver Barre  Date Time: 04/10/18 2:29 PM    [place]:        Name: Irene Mitcham    Discharge Address: [place] [address]      Primary Telephone Number: [phone]   Secondary Telephone Number: [phone], grandson's cell  Emergency Contact and Number: Extended  Emergency Contact Information  Primary Emergency Contact: [name] [place] Phone: [phone]  Work Phone: [phone]  Mobile Phone: [phone]  Relation: Daughter  Secondary Emergency Contact: [place] Phone: [phone]  Relation: Grandchild        Ordering Physician: Particia Jasper, MD    Following Physician:Malarmathi Sidonie Dickens, MD    PCP: Danella Sensing, MD, [phone]    Agreeable to Follow: Yes  Date/Time of Call: 04/10/2017@1432     Language/Communication Barrier:  Yes        Yes, speak [place]    Primary Diagnosis and Reason for Services:    Khiyan Crace is a 85 y.o. male w history of Dm2, CKD, HTN, CHF, Pacemaker, Nstemi, chronic pain, Gout who presents with acute worsening of pain in his knees.  Per family at bedside his pain in his knees has been radiating up into his groin, this is been ongoing for the past year but acutely worsened over the past 3 days.  He normally ambulates using a seated walker, but now has been carried around due  to difficulty ambulating secondary to pain.  He has great difficulty using the stairs at home due to the pain in his knees.   CKD (chronic kidney disease), stage IV (Chronic) N18.4   Essential hypertension (Chronic) I10   T2DM (type 2 diabetes mellitus) (Chronic) E11.9   Pacemaker (Chronic) Z95.0   Chronic pain (Chronic) G89.29   Acute gout of knees M10.9  impaired mobility      [employer] (Labs, Wounds, Infusions, etc.):      Additional Comments:  NO SOC CALL IS NEEDED        Discharge Date: 04/11/2017  Referral Source (PACC/Hospital/Unit): Oliver Barre, RN  Referral Date: 04/10/18    Home Health face-to-face (FTF) Encounter (Order [id number])   Consult   Date: 04/10/2018 Department: [facility] Ordering/Authorizing: Particia Jasper, MD   Order Information     Order Date/Time Release Date/Time Start Date/Time End Date/Time   04/10/18 02:26 PM None 04/10/18 02:22 PM  04/10/18 02:22 PM   Order Details     Frequency Duration Priority Order Class   Once 1 occurrence Routine Hospital Performed   Standing Order Information     Remaining Occurrences Interval Last Released      0/1 Once 04/10/2018            Provider Information     Ordering User Ordering Provider Authorizing Provider   Oliver Barre, RN Particia Jasper, MD Particia Jasper, MD   Attending Provider(s) Admitting Provider PCP   Salome Spotted, MD; [name], MD; Shirlean Schlein, MD; Particia Jasper, MD [name], MD Danella Sensing, MD   Verbal Order Info     Action Created on Order Mode Entered by Responsible Provider Signed by Signed on   Ordering 04/10/18 1426 Telephone with Gaynelle Arabian, RN Particia Jasper, MD     Comments     Home nursing required for skilled assessment including cardiopulmonary assessment and dietary education for disease management, and medication instruction. Home PT/OT required for gait and balance training, strengthening, mobility, fall prevention, and ADL training.   DX:  CKD (chronic kidney disease), stage IV (Chronic) N18.4   Essential hypertension (Chronic) I10   T2DM (type 2 diabetes mellitus) (Chronic) E11.9   Pacemaker (Chronic) Z95.0   Chronic pain (Chronic) G89.29   Acute gout of knees M10.9  impaired mobility    Following [id number], Philis Nettle, MD         Order Questions     Question Answer Comment   Date of face-to-face (FTF) encounter: 04/10/2018    Medical conditions that necessitate Home Health care: B. Functional impairment due to recent hospitalization/procedure/treatment     D. Chronic illness & risk for re-hospitalization due to unstable disease status     E. Exacerbation of disease requiring follow up monitoring     F. New diagnosis & treatment requiring follow up monitoring and management     A. Post operative procedure requiring follow up care & monitoring for complication    Clinical  findings that support the need for Skilled Nursing. SN will: C. Monitor for signs and symptoms of exacerbation of disease and management    Clinical findings that support the need for Physical Therapy. PT will A. Evaluate and treat functional impairment and improve mobility     C. Educate on weight bearing status, stair/gait training, balance & coordination     D. Provide services to help restore function, mobility, and releive pain  E. Educate on functional mobility; bed, chair, sit, stand and transfer activities     F. Perform home safety assessment & develop safe in home exercise program     G. Implement activities to improve stance time, cadence & step length     H. Educate on the safe use of assistive device/ durable medical equipment     I. Instruct on restorative activities to restore ability to perform ADL    Clinical findings support the need for OT (needs SN/PT order).OT will A. Develop in home program to improve ability to perform ADLs     B. Develop restorative program to improve mobility and independence     C. Educate on recovery and maintenance skills     D. Instruct on strategies to compensate for loss of function     E. Educate on basic motor function and reasoning abilities    Clinical findings that support the need for SLP. ST will F. N/A    Per clinical findings, following services are medically necessary: Skilled Nursing     PT     OT    Evidence this patient is homebound because: B. Profound weakness, poor balance/unsteady gait d/t illness/treatment/procedure     C. Decreased endurance, strength, ROM, cadence, safety/judgment during mobility     G. Fall risk due to impaired coordination, gait and decreased balance     M. Unsteady gait, poor ambulation with history of falls     L. Transfer/ambulation requires stand by assist and verbal cues to perform safely          Process Instructions     Please select Home Care Services medically necessary.     Based on the above  findings, I certify that this patient is confined to the home and needs intermittent skilled nursing care, physical therapry and / or speech therapy or continues to need occupational therapy. The patient is under my care, and I have initiated the establishment of the plan of care. This patient will be followed by a physician who will periodically review the plan of care.    Collection Information     Consult Order Info     ID Description Priority Start Date Start Time   [id number] [place] face-to-face (FTF) Encounter Routine 04/10/2018 2:22 PM   Provider Specialty Referred to   ______________________________________ _____________________________________   Acknowledgement Info     For At Acknowledged By Acknowledged On   Placing Order 04/10/18 1426 Oliver Barre, RN 04/10/18 1426   Verbal Order Info     Action Created on Order Mode Entered by Responsible Provider Signed by Signed on   Ordering 04/10/18 1426 Telephone with Gaynelle Arabian, RN Particia Jasper, MD     Patient Information     Patient Name  Valor, Quaintance Sex  Male DOB  [birth year]   Additional Information     Associated Reports External References   Priority and Order Details [facility]

## 2018-04-10 NOTE — Discharge Instr - AVS First Page (Addendum)
Reason for your Hospital Admission:  Knee pain, unable to walk; found to have gout flare      Instructions for after your discharge:  1. Please call your PCP & schedule follow up visit after hospital stay. We recommend that you follow up in 1-2 weeks.   2. Knee pain is likely due to gout flare, you were given dose of Colchicine (anti-gout medication) and started on Prednisone taper (gradually decreasing dose).   3. Starting 1/8 you will take Prednisone 20 mg x 3 days, then on 1/11 you will take 10 mg x 3 days. After this, you should continue 5 mg daily until Rheumatologist gives you further instructions.   4. Prednisone medication can be irritating to stomach, and cause insomnia. Take with food/ milk; and try to take with breakfast or early in the day. Continue taking Protonix to help protect stomach.   5. Prednisone increases your blood sugar, so while on higher doses of Prednisone; we recommend increasing your dose of Lantus to 25 units. Carefully monitor your blood sugar while on this medication.   6. PT/OT evaluated you, recommended Home Health PT/OT, to help you get your strength back. Case Management will help coordinate services if you are interested.           Home Health Discharge Information     Your doctor has ordered Skilled Nursing, Physical Therapy and Occupational Therapy in-home service(s) for you while you recuperate at home, to assist you in the transition from hospital to home.      The agency that you or your representative chose to provide the service:  Name of [place] Placement: [facility] [phone]                  The above services were set up by:  Oliver Barre  ([facility])   Phone    [phone]                                         Additional comments:     IF YOU HAVE NOT HEARD [place] 24-48 HOURS AFTER DISCHARGE PLEASE CALL YOUR AGENCY TO ARRANGE A TIME FOR YOUR FIRST VISIT. FOR ANY SCHEDULING CONCERNS OR  QUESTIONS RELATED TO HOME HEALTH, SUCH AS TIME OR DATE PLEASE CONTACT YOUR HOME HEALTH AGENCY AT THE NUMBER LISTED ABOVE.

## 2018-04-10 NOTE — Plan of Care (Signed)
Problem: Moderate/High Fall Risk Score >5  Goal: Patient will remain free of falls  Outcome: Progressing  Flowsheets (Taken 04/10/2018 1849)  Moderate Risk (6-13): MOD-Remain with patient during toileting; MOD-Use of chair-pad alarm when appropriate; MOD-Consider activation of bed alarm if appropriate; MOD-Initiate Yellow "Fall Risk" magnet communication tool     Problem: Safety  Goal: Patient will be free from injury during hospitalization  Outcome: Progressing  Flowsheets (Taken 04/10/2018 0004 by Clearnce Hasten, RN)  Patient will be free from injury during hospitalization : Assess patient's risk for falls and implement fall prevention plan of care per policy;Provide and maintain safe environment;Hourly rounding;Include patient/ family/ care giver in decisions related to safety;Assess for patients risk for elopement and implement [place] per policy;Provide alternative method of communication if needed (communication boards, writing)  Goal: Patient will be free from infection during hospitalization  Outcome: Progressing  Flowsheets (Taken 04/10/2018 0004 by Clearnce Hasten, RN)  Free from Infection during hospitalization: Assess and monitor for signs and symptoms of infection;Monitor lab/diagnostic results;Monitor all insertion sites (i.e. indwelling lines, tubes, urinary catheters, and drains);Encourage patient and family to use good hand hygiene technique     Problem: Pain  Goal: Pain at adequate level as identified by patient  Outcome: Progressing  Flowsheets (Taken 04/10/2018 0004 by Clearnce Hasten, RN)  Pain at adequate level as identified by patient: Identify patient comfort function goal;Assess for risk of opioid induced respiratory depression, including snoring/sleep apnea. Alert healthcare team of risk factors identified.;Assess pain on admission, during daily assessment and/or before any "as needed" intervention(s);Reassess pain within 30-60 minutes of any procedure/intervention, per Pain  Assessment, Intervention, Reassessment (AIR) Cycle;Evaluate if patient comfort function goal is met;Consult/collaborate with Physical Therapy, Occupational Therapy, and/or Speech Therapy;Include patient/patient care companion in decisions related to pain management as needed;Evaluate patient's satisfaction with pain management progress;Offer non-pharmacological pain management interventions     Problem: Side Effects from Pain Analgesia  Goal: Patient will experience minimal side effects of analgesic therapy  Outcome: Progressing  Flowsheets (Taken 04/10/2018 0004 by Clearnce Hasten, RN)  Patient will experience minimal side effects of analgesic therapy: Monitor/assess patient's respiratory status (RR depth, effort, breath sounds);Assess for changes in cognitive function;Prevent/manage side effects per LIP orders (i.e. nausea, vomiting, pruritus, constipation, urinary retention, etc.);Evaluate for opioid-induced sedation with appropriate assessment tool (i.e. POSS)     Problem: Discharge Barriers  Goal: Patient will be discharged home or other facility with appropriate resources  Outcome: Progressing  Flowsheets (Taken 04/10/2018 1849)  Discharge to home or other facility with appropriate resources: Provide appropriate patient education     Problem: Psychosocial and Spiritual Needs  Goal: Demonstrates ability to cope with hospitalization/illness  Outcome: Progressing  Flowsheets (Taken 04/10/2018 0004 by Clearnce Hasten, RN)  Demonstrates ability to cope with hospitalizations/illness: Encourage verbalization of feelings/concerns/expectations;Provide quiet environment;Encourage patient to set small goals for self;Assist patient to identify own strengths and abilities;Encourage participation in diversional activity;Reinforce positive adaptation of new coping behaviors;Include patient/ patient care companion in decisions;Communicate referral to spiritual care as appropriate     Problem: Compromised Tissue integrity  Goal:  Damaged tissue is healing and protected  Outcome: Progressing  Flowsheets (Taken 04/10/2018 0004 by Clearnce Hasten, RN)  Damaged tissue is healing and protected : Monitor/assess Braden scale every shift;Keep intact skin clean and dry  Goal: Nutritional status is improving  Outcome: Progressing  Flowsheets (Taken 04/10/2018 0004 by Clearnce Hasten, RN)  Nutritional status is improving: Assist patient with eating;Allow adequate time for meals;Include patient/patient care companion in  decisions related to nutrition

## 2018-04-10 NOTE — Plan of Care (Addendum)
Adult Observation Progress Note      Shift Note: Seen patient on bed, non English speaking. Speaks [place], which grand son translates - waiver has been signed. No complaints of pain, patient AAOx3. Kept on moderate fall risk. 18LAC with 9ml/hr Plasmalyte. On strict I/O's. All needs attended. Call light placed within reach. Will continue to monitor.     Post residual bladder scan done: 0ML    BM during shift: No     Pending Orders: OT eval    Discharge Plan: pending [place]    Social/Family Visits: grandchildren    POC update: patient and grandchildren      Vitals:    04/10/18 1120 04/10/18 1832 04/10/18 1914 04/10/18 2322   BP: 131/75 109/55 109/61 105/65   Pulse: 72 68 68 71   Resp: 19 16 16 16    Temp: 98.2 F (36.8 C)  97.9 F (36.6 C) 97.5 F (36.4 C)   TempSrc: Oral  Oral Oral   SpO2: 96% 95% 94% 97%   Weight:       Height:           Patient Lines/Drains/Airways Status      Active Lines, Drains and Airways       Name:   Placement date:   Placement time:   Site:   Days:    Peripheral IV 04/08/18 Left Antecubital   04/08/18    2214    Antecubital   2                   Problem: Moderate/High Fall Risk Score >5  Goal: Patient will remain free of falls  Outcome: Progressing  Flowsheets (Taken 04/10/2018 2330)  Moderate Risk (6-13): MOD-(V[facility] Only) Yellow "Fall Risk" signage     Problem: Safety  Goal: Patient will be free from injury during hospitalization  Outcome: Progressing  Flowsheets (Taken 04/10/2018 2330)  Patient will be free from injury during hospitalization : Assess patient's risk for falls and implement fall prevention plan of care per policy; Provide and maintain safe environment; Use appropriate transfer methods; Ensure appropriate safety devices are available at the bedside  Goal: Patient will be free from infection during hospitalization  Outcome: Progressing  Flowsheets (Taken 04/10/2018 2330)  Free from Infection during hospitalization: Assess and monitor for signs and symptoms of infection; Monitor  lab/diagnostic results     Problem: Pain  Goal: Pain at adequate level as identified by patient  Outcome: Progressing  Flowsheets (Taken 04/10/2018 2330)  Pain at adequate level as identified by patient: Identify patient comfort function goal; Reassess pain within 30-60 minutes of any procedure/intervention, per Pain Assessment, Intervention, Reassessment (AIR) Cycle; Evaluate if patient comfort function goal is met; Evaluate patient's satisfaction with pain management progress; Offer non-pharmacological pain management interventions     Problem: Side Effects from Pain Analgesia  Goal: Patient will experience minimal side effects of analgesic therapy  Outcome: Progressing  Flowsheets (Taken 04/10/2018 2330)  Patient will experience minimal side effects of analgesic therapy: Monitor/assess patient's respiratory status (RR depth, effort, breath sounds); Assess for changes in cognitive function     Problem: Discharge Barriers  Goal: Patient will be discharged home or other facility with appropriate resources  Outcome: Progressing  Flowsheets (Taken 04/10/2018 2330)  Discharge to home or other facility with appropriate resources: Provide appropriate patient education; Provide information on available health resources     Problem: Psychosocial and Spiritual Needs  Goal: Demonstrates ability to cope with hospitalization/illness  Outcome: Progressing  Flowsheets (Taken  04/10/2018 2330)  Demonstrates ability to cope with hospitalizations/illness: Encourage verbalization of feelings/concerns/expectations; Provide quiet environment; Assist patient to identify own strengths and abilities; Encourage patient to set small goals for self     Problem: Compromised Tissue integrity  Goal: Damaged tissue is healing and protected  Outcome: Progressing  Flowsheets (Taken 04/10/2018 2330)  Damaged tissue is healing and protected : Monitor/assess Braden scale every shift; Keep intact skin clean and dry; Use bath wipes, not soap and water, for daily  bathing  Goal: Nutritional status is improving  Outcome: Progressing  Flowsheets (Taken 04/10/2018 2330)  Nutritional status is improving: Allow adequate time for meals

## 2018-04-10 NOTE — Progress Notes (Signed)
MEDICINE PROGRESS NOTE    Date Time: 04/10/18 10:55 AM  Patient Name: Marco Bruce, Marco Bruce  JQB:[id number]  W409/B353.29  Attending Physician: Particia Jasper, MD    CC: bilateral knee pain, left worse than right    Hospital Course: Winfield Caba is a 85 y.o. male with a PMHx of HTN, DMII, HLD, CHF admitted 04/08/2018 for gout with bilateral knee swelling and pain. ESR 88, CRP 17, uric acid 10.3. Right knee XR has moderate joint effusion, left knee XR shows moderate-large joint effusion. BLE venous doppler negative for DVT. Pelvic XR negative.       Interval History:  1/5: increased prednisone dosing.  Gave colchicine 9.6mg  x 1 dose.  Discussed with ortho who recommended Rheum consult.  1/6: Rheumatology consulted and recommended a prednisone taper and to resume allopurinol. Pain is much improved.  Left knee is swollen but family and patient state it is not worse than yesterday, no overt redness or warmth.  Creatinine increased from 2.8 to 3.3 overnight.  UA negative. IVF started.     Expected [place]: likely tomorrow if creatinine improves.     Assessment/Plan:   1. Gout with Bilateral Knee swelling L>R   -Rheumatology consulted, appreciate recommendations   -prednisone taper: 30mg /day x3 moredays, 20mg /day x 3 days, 10mg /d x 3 days then would continue prednisone at 5mg /day    -OP Rheum in 2 weeks   -ace wrap to left knee   -low purine diet   -restart home dose allopurinol 100mg /day   -PT recommending SNF    2. AKI on CKD   -SrCr 2.8 is baseline   -creatinine is 3.3 today, up from 2.8 yesterday   -UA negative   -start plasmalyte 54ml/hr x 1L--watch closely for fluid overload    -start a 1252ml Fluid restriction to accommodate IV fluids.    -repeat BMP in AM   -avoid further dosing of colchicine, NSAIDs, and nephrotoxic agents   -renally dose meds   -strict I&Os    Chronic Diseases:   DMII: check a1c in AM.  Accuchecks achs, SSI, lantus 10units at bedtime  HTN, CHF, CAD s/p pacemaker: continue asa, lasix, toprol xl.  Appears  euvolemic  HLD: continue statin      Please see attending note that follows this mid-level encounter note.   Review of Systems:     Review of Systems   Constitutional: Negative.    HENT: Negative.    Eyes: Negative.    Respiratory: Negative.    Cardiovascular: Negative.    Gastrointestinal: Negative.    Genitourinary: Negative.    Musculoskeletal: Positive for joint pain (bilateral knee pain and swelling).   Skin: Negative.    Neurological: Negative.    Endo/Heme/Allergies: Negative.    Psychiatric/Behavioral: Negative.      Physical Exam:   BP (!) 207/80    Pulse 98    Temp 98.4 F (36.9 C) (Oral)    Resp 18    Ht 1.575 m (5\' 2" )    Wt 72.1 kg (159 lb)    SpO2 96%    BMI 29.08 kg/m   Body mass index is 29.08 kg/m.    Intake/Output Summary (Last 24 hours) at 04/10/2018 1055  Last data filed at 04/10/2018 0710  Gross per 24 hour   Intake    Output 125 ml   Net -125 ml       Physical Exam  Vitals signs and nursing note reviewed.   Constitutional:       Appearance: Normal appearance.  HENT:      Head: Normocephalic.      Right Ear: External ear normal.      Left Ear: External ear normal.      Nose: Nose normal. No congestion or rhinorrhea.      Mouth/Throat:      Mouth: Mucous membranes are moist.      Pharynx: Oropharynx is clear.   Eyes:      Extraocular Movements: Extraocular movements intact.      Conjunctiva/sclera: Conjunctivae normal.      Pupils: Pupils are equal, round, and reactive to light.   Neck:      Musculoskeletal: Normal range of motion and neck supple.   Cardiovascular:      Rate and Rhythm: Normal rate and regular rhythm.      Pulses: Normal pulses.      Heart sounds: Normal heart sounds. No murmur. No gallop.    Pulmonary:      Effort: Pulmonary effort is normal. No respiratory distress.      Breath sounds: Normal breath sounds. No wheezing, rhonchi or rales.   Chest:      Chest wall: No tenderness.   Abdominal:      General: Abdomen is flat. Bowel sounds are normal. There is no distension.       Palpations: Abdomen is soft. There is no mass.      Tenderness: There is no abdominal tenderness. There is no right CVA tenderness, left CVA tenderness, guarding or rebound.      Hernia: No hernia is present.   Musculoskeletal: Normal range of motion.         General: Swelling (left knee) and tenderness (bilateral knees, L >R) present.   Skin:     General: Skin is warm and dry.      Capillary Refill: Capillary refill takes 2 to 3 seconds.      Coloration: Skin is not jaundiced or pale.      Findings: No bruising, erythema, lesion or rash.   Neurological:      General: No focal deficit present.      Mental Status: He is alert and oriented to person, place, and time.   Psychiatric:         Mood and Affect: Mood normal.         Behavior: Behavior normal.         Thought Content: Thought content normal.         Judgment: Judgment normal.         Meds:     Current Facility-Administered Medications   Medication Dose Route Frequency    allopurinol  100 mg Oral Daily    aspirin  81 mg Oral Daily    atorvastatin  40 mg Oral QHS    furosemide  40 mg Oral Daily    heparin (porcine)  5,000 Units Subcutaneous Q8H [place]    insulin glargine  10 Units Subcutaneous QHS    insulin lispro  1-5 Units Subcutaneous TID AC    metoprolol succinate XL  12.5 mg Oral Daily    pantoprazole  40 mg Oral QAM AC    predniSONE  30 mg Oral Daily    senna-docusate  2 tablet Oral BID    sodium bicarbonate  1,300 mg Oral BID     Labs:     Recent Labs     04/09/18  0848 04/08/18  2215   WBC 7.79 9.34   Hgb 11.2* 11.1*   Hematocrit 35.1* 35.0*  Platelets 180 175   MCV 92.1 92.6     Recent Labs     04/10/18  0819 04/09/18  0848   Sodium 136 138   Potassium 4.8 4.9   Chloride 104 105   CO2 22 19*   BUN 69.0* 52.0*   Creatinine 3.3* 2.8*   Glucose 270* 183*   Calcium 9.0 9.6     Recent Labs     04/09/18  0848 04/08/18  2215   AST (SGOT) 28 19   ALT <6 <6   Alkaline Phosphatase 45 54   Protein, Total 6.5 6.6   Albumin 3.7 3.9     Recent Labs      04/09/18  0848 04/08/18  2215   PTT  --  47*   PT 15.0 14.8   PT INR 1.2* 1.2*     Imaging personally reviewed.    Safety Checklist  DVT prophylaxis:  CHEST guideline (See page [id number]) Chemical and Mechanical   Foley:   Rn Foley protocol Not present   IVs:  Peripheral IV   PT/OT: Ordered   Daily CBC & or Chem ordered:  SHM/ABIM guidelines (see #5) Yes, due to clinical and lab instability     Disposition:   Today's date: 04/10/2018   Admit Date: 04/08/2018 11:09 PM  Anticipated medical stability for discharge:Yellow - maybe tomorrow  Service status: Inpatient non-IMC Status: Due to: AKI on CKD needing IV fluids and renal monitoring  Reason for ongoing hospitalization: same as above  Anticipated discharge needs: TBD  I have discussed with Attending: Particia Jasper, MD  Signed by: Tilden Dome, [place]  Adult Observation Unit Nurse Practitioner  [phone] or [phone] on [phone]

## 2018-04-10 NOTE — Progress Notes (Signed)
S: admitted w/ unable to ambulate. PT/OT consulted    B: Patient lives home with his grandson Marco Bruce and daughter [name].  He needs assistance with all of his ADLs, has a wheelchair and walker for mobility, and no longer drives.  He had home health with [facility], family wants to resume these services, writer relayed this to [employer] [phone].  Family also stated last year patient went to a SNF, it was not a good experience, their plan is to take patient home.      04/10/18 1414   Patient Type   Within 30 Days of Previous Admission? No   Healthcare Decisions   Interviewed: Family  (grandson Marco Bruce, daughter Research officer, trade union at bedside)   Orientation/Decision Making Abilities of Patient Alert and Oriented x3, able to make decisions   Advance Directive Patient does not have advance directive   Healthcare Agent Appointed Yes   (RETIRED) Healthcare Agent's Name grandson corey   Prior to admission   Prior level of function Ambulates independently;Needs assistance with ADLs   Type of Residence Private residence   Home Layout Two level   Living Arrangements Children;Family members   How do you get to your MD appointments? family drives   How do you get your groceries? family drives   Who fixes your meals? family cooks   Who does your laundry? family does the laundry   Who picks up your prescriptions? family drives   Dressing Needs assistance   Grooming Needs assistance   Feeding Needs assistance   Bathing Needs assistance   Toileting Needs assistance   DME Currently at [employer], [employer];Wheelchair, [place] PT/OT/Speech  (had home health with [place] [facility])   Prior SNF admission? (Detail) in SNF a year ago, does not recall the name of the facility.  Family stated they do not want patient to go back to SNF   Prior Rehab admission? (Detail) na   Adult Protective Services (APS) involved? No   Discharge Planning   Support Systems Children;Family members   Patient expects to be discharged  to: home w/ HHPT/OT/RN   Anticipated [place] plan discussed with: Same as interviewed   Potential barriers to discharge:   (na)   Mode of transportation: Private car (family member)   Does the patient have perscription coverage? Yes   Consults/Providers   PT Evaluation Needed 1   OT Evalulation Needed 1   SLP Evaluation Needed 2   Correct PCP listed in Epic? Yes   PCP   PCP on file was verified as the current PCP? Yes   Important Message from Medicare Notice   Patient received 1st IMM Letter? n/a   A: has insurance, family support, grandson to drive him home    R: will follow for discharge planning needs      [facility]  [phone]  [email or web address]

## 2018-04-10 NOTE — Plan of Care (Signed)
Adult Observation Progress Note      Shift Note: Pt Khmer speaking. Pt's family at bedside to translate. Pt denies any pain. VSS. Will continue to monitor.     BM during shift: No.    Pending Orders: needs rheumatology consult    Discharge Plan: pending PT eval    Social/Family Visits: Family at bedside entire shift.     POC update: Pt and family updated.       Vitals:    04/09/18 1129 04/09/18 1517 04/09/18 1916 04/09/18 2314   BP: 121/61 124/73 125/62 126/69   Pulse: 79 68 68 67   Resp: 16  16 18    Temp: 98.7 F (37.1 C) 98.4 F (36.9 C) 97.9 F (36.6 C) 97.3 F (36.3 C)   TempSrc:  Oral Oral Oral   SpO2:  96% 97% 97%   Weight: 72.1 kg (159 lb)      Height: 1.575 m (5\' 2" )          Patient Lines/Drains/Airways Status      Active Lines, Drains and Airways       Name:   Placement date:   Placement time:   Site:   Days:    Peripheral IV 04/08/18 Left Antecubital   04/08/18    2214    Antecubital   1                    Problem: Moderate/High Fall Risk Score >5  Goal: Patient will remain free of falls  Outcome: Progressing  Flowsheets (Taken 04/09/2018 1956)  Moderate Risk (6-13): MOD-Floor mat at bedside (where available) if appropriate;MOD-Remain with patient during toileting;MOD-(VH Only) Apply yellow "Fall Risk" arm band;MOD-(VH Only) Yellow "Fall Risk" signage;MOD-Initiate Yellow "Fall Risk" magnet communication tool     Problem: Safety  Goal: Patient will be free from injury during hospitalization  Outcome: Progressing  Flowsheets (Taken 04/10/2018 0004)  Patient will be free from injury during hospitalization : Assess patient's risk for falls and implement fall prevention plan of care per policy; Provide and maintain safe environment; Hourly rounding; Include patient/ family/ care giver in decisions related to safety; Assess for patients risk for elopement and implement Elopement Risk Plan per policy; Provide alternative method of communication if needed (communication boards, writing)  Goal: Patient will be  free from infection during hospitalization  Outcome: Progressing  Flowsheets (Taken 04/10/2018 0004)  Free from Infection during hospitalization: Assess and monitor for signs and symptoms of infection; Monitor lab/diagnostic results; Monitor all insertion sites (i.e. indwelling lines, tubes, urinary catheters, and drains); Encourage patient and family to use good hand hygiene technique     Problem: Pain  Goal: Pain at adequate level as identified by patient  Outcome: Progressing  Flowsheets (Taken 04/10/2018 0004)  Pain at adequate level as identified by patient: Identify patient comfort function goal; Assess for risk of opioid induced respiratory depression, including snoring/sleep apnea. Alert healthcare team of risk factors identified.; Assess pain on admission, during daily assessment and/or before any "as needed" intervention(s); Reassess pain within 30-60 minutes of any procedure/intervention, per Pain Assessment, Intervention, Reassessment (AIR) Cycle; Evaluate if patient comfort function goal is met; Consult/collaborate with Physical Therapy, Occupational Therapy, and/or Speech Therapy; Include patient/patient care companion in decisions related to pain management as needed; Evaluate patient's satisfaction with pain management progress; Offer non-pharmacological pain management interventions     Problem: Side Effects from Pain Analgesia  Goal: Patient will experience minimal side effects of analgesic therapy  Outcome: Progressing  Flowsheets (  Taken 04/10/2018 0004)  Patient will experience minimal side effects of analgesic therapy: Monitor/assess patient's respiratory status (RR depth, effort, breath sounds); Assess for changes in cognitive function; Prevent/manage side effects per LIP orders (i.e. nausea, vomiting, pruritus, constipation, urinary retention, etc.); Evaluate for opioid-induced sedation with appropriate assessment tool (i.e. POSS)     Problem: Discharge Barriers  Goal: Patient will be discharged  home or other facility with appropriate resources  Outcome: Progressing  Flowsheets (Taken 04/10/2018 0004)  Discharge to home or other facility with appropriate resources: Provide appropriate patient education; Provide information on available health resources; Initiate discharge planning     Problem: Psychosocial and Spiritual Needs  Goal: Demonstrates ability to cope with hospitalization/illness  Outcome: Progressing  Flowsheets (Taken 04/10/2018 0004)  Demonstrates ability to cope with hospitalizations/illness: Encourage verbalization of feelings/concerns/expectations; Provide quiet environment; Encourage patient to set small goals for self; Assist patient to identify own strengths and abilities; Encourage participation in diversional activity; Reinforce positive adaptation of new coping behaviors; Include patient/ patient care companion in decisions; Communicate referral to spiritual care as appropriate     Problem: Compromised Tissue integrity  Goal: Damaged tissue is healing and protected  Outcome: Progressing  Flowsheets (Taken 04/10/2018 0004)  Damaged tissue is healing and protected : Monitor/assess Braden scale every shift; Keep intact skin clean and dry  Goal: Nutritional status is improving  Outcome: Progressing  Flowsheets (Taken 04/10/2018 0004)  Nutritional status is improving: Assist patient with eating; Allow adequate time for meals; Include patient/patient care companion in decisions related to nutrition

## 2018-04-10 NOTE — Progress Notes (Signed)
Met with patient in hospital room. Discussed home health services recommended. Patient declines PT/OT desires SN only.  Agency placement pending.

## 2018-04-10 NOTE — Consults (Signed)
CONSULTATION    Date Time: 04/10/18 10:21 AM  Patient Name: [facility]  Requesting Physician: Particia Jasper, MD      Reason for Consultation:   gout    Assessment:   Marco Bruce is a 85 y.o. male with PMH of HTN, DM, HLD, CHF, and known gout admitted with b/l knee swelling and pain.     Plan:   1. B/l acute knee pain/swelling  -xrays with DJD and moderate effusions  -symptoms c/w previous gout flares, no fevers/chills and given b/l unlikely infectious  -improving with steroids, continue prednisone 30mg /day x 3 more days, 20mg /day x 3 days, 10mg /d x 3 days then would continue prednisone at 5mg /day to be used as ppx while eventually escalating urate lowering therapy. Will need f/up in ~ 2 weeks  -cont allopurinol at his home dose for now (100mg /d), stopping, starting or changing urate lowering meds during a flare can worsen flare  -avoid colchicine given renal dysfunction    2. Chronic gout/hyperuricemia  -cont allopurinol home dose of allopurinol 100mg /d  -urate not at goal, currently 10.3  -will need increase dose of allopurinol or to switch to uloric but not during acute flare as can worsen flare-will need to be done outpt  -low purine diet  -pt to f/up with outpt rheumatology for further management    3. CKD  -avoid nsaids/colchicine  -hyperuricemia can contribute to renal ds    5. High risk medication use  -with steroids, start ca/vitD for bone health  -with steroids start GI ppx    Pt to f/up with outpt rheumatology 1-2 weeks after discharge  Discussed plan with pt, daughter in room and other daughter over phone      History:   Marco Bruce is a 85 y.o. male who presents to the hospital on 04/08/2018 with  PMH of HTN, DM, HLD, CHF, and known gout admitted with b/l knee swelling and pain     Pt has long standing gout with recently more frequent flares. Per son, pt has had known gout for >20 years. He has been on allopurinol 100mg /day for quite some time.  He has had frequent flares esp in the knees over the past  year.  He gets relief with prednisone tapers. He was recently seen by me in 10/19 hospitalization with similar flare. He was treated with steroid taper then with improvement.  Now he returns with 3 days of b/l knee pain swelling. Symptoms consistent with previous gout flares. He denies fevers/chills/night sweats. Per daughter, L knee has been painful for almost 2 weeks.         He does not drink etoh.  He denies FH of gout.     Past Medical History:     Past Medical History:   Diagnosis Date    Acute diastolic heart failure 95/28/4132    Acute on chronic diastolic congestive heart failure 08/13/2016    Acute pulmonary edema 05/26/2015    Chronic diastolic heart failure 44/04/270    Chronic gout     Chronic pain syndrome 07/07/2015    CKD (chronic kidney disease) stage 4, GFR 15-29 ml/min     2018 baseline creatinine in EPIC is 1.5-2.3.    Congestive heart failure (CHF)     EF 40% 5/18 echo, on lasix    Current chronic use of systemic steroids     For Polymyalgia rheumatica    Essential hypertension     Mixed hyperlipidemia     NSTEMI (non-ST elevated myocardial infarction) 08/13/2016  Osteoarthritis of both knees     Peripheral edema 06/16/2015    Pneumonia due to infectious organism, unspecified laterality, unspecified part of lung 06/25/2015    Polymyalgia rheumatica     chronic steroids    Renovascular hypertension 08/04/2015    Second degree heart block by electrocardiogram (ECG) 04/27/2015    Type 2 diabetes mellitus with complication, with long-term current use of insulin     12/01/2016 Hemoglobin A1c 8.2%       Past Surgical History:     Past Surgical History:   Procedure Laterality Date    ABDOMINAL SURGERY      CHOLECYSTECTOMY      PACEMAKER Left 2017       Family History:     Family History   Problem Relation Age of Onset    Hypertension Father        Social History:     Social History     Socioeconomic History    Marital status: Widowed     Spouse name: Not on file    Number of  children: Not on file    Years of education: Not on file    Highest education level: Not on file   Occupational History    Not on file   Social Needs    Financial resource strain: Not on file    Food insecurity:     Worry: Not on file     Inability: Not on file    Transportation needs:     Medical: Not on file     Non-medical: Not on file   Tobacco Use    Smoking status: Never Smoker    Smokeless tobacco: Never Used   Substance and Sexual Activity    Alcohol use: No     Comment: former alcoholic    Drug use: No    Sexual activity: Not on file   Lifestyle    Physical activity:     Days per week: Not on file     Minutes per session: Not on file    Stress: Not on file   Relationships    Social connections:     Talks on phone: Not on file     Gets together: Not on file     Attends religious service: Not on file     Active member of club or organization: Not on file     Attends meetings of clubs or organizations: Not on file     Relationship status: Not on file    Intimate partner violence:     Fear of current or ex partner: Not on file     Emotionally abused: Not on file     Physically abused: Not on file     Forced sexual activity: Not on file   Other Topics Concern    Not on file   Social History Narrative    Not on file       Allergies:   No Known Allergies    Medications:     Current Facility-Administered Medications   Medication Dose Route Frequency    aspirin  81 mg Oral Daily    atorvastatin  40 mg Oral QHS    furosemide  40 mg Oral Daily    heparin (porcine)  5,000 Units Subcutaneous Q8H [place]    insulin glargine  10 Units Subcutaneous QHS    insulin lispro  1-5 Units Subcutaneous TID AC    metoprolol succinate XL  12.5 mg Oral Daily  pantoprazole  40 mg Oral QAM AC    predniSONE  30 mg Oral Daily    senna-docusate  2 tablet Oral BID    sodium bicarbonate  1,300 mg Oral BID       Review of Systems:   All systems reviewed and negative except as per HPI     Physical Exam:     Vitals:     04/10/18 1015   BP: (!) 207/80   Pulse: 98   Resp:    Temp:    SpO2: 96%       Intake and Output Summary (Last 24 hours) at Date Time    Intake/Output Summary (Last 24 hours) at 04/10/2018 1021  Last data filed at 04/10/2018 0710  Gross per 24 hour   Intake    Output 125 ml   Net -125 ml       General appearance - alert, well appearing, and in no distress  Mental status - alert, oriented to person, place, and time  Eyes - pupils equal and reactive, extraocular eye movements intact  Nose - normal and patent, no erythema, discharge or polyps  Mouth - mucous membranes moist, pharynx normal without lesions  Chest - clear to auscultation, no wheezes, rales or rhonchi, symmetric air entry  Heart - normal rate, regular rhythm, normal S1, S2, no murmurs, rubs, clicks or gallops  Abdomen - soft, nontender, nondistended, no masses or organomegaly  Neurological - alert, oriented, normal speech, no focal findings or movement disorder noted  Musculoskeletal - L>R knee effusions, no warmth, erythema. Minimal ttp. No other painful/swollen joints  Extremities - peripheral pulses normal, no pedal edema, no clubbing or cyanosis  Skin - no rash, no tophi noted    Labs Reviewed:     Results     Procedure Component Value Units Date/Time    Basic Metabolic Panel [[id number]]  (Abnormal) Collected:  04/10/18 0819    Specimen:  Blood Updated:  04/10/18 0854     Glucose 270 mg/dL      BUN 69.0 mg/dL      Creatinine 3.3 mg/dL      Calcium 9.0 mg/dL      Sodium 136 mEq/L      Potassium 4.8 mEq/L      Chloride 104 mEq/L      CO2 22 mEq/L     GFR [[id number]] Collected:  04/10/18 0819     Updated:  04/10/18 0854     EGFR 17.7    Glucose Whole Blood - POCT [[id number]]  (Abnormal) Collected:  04/10/18 0718     Updated:  04/10/18 0734     POCT - Glucose Whole blood 247 mg/dL     Glucose Whole Blood - POCT [[id number]]  (Abnormal) Collected:  04/09/18 2101     Updated:  04/09/18 2106     POCT - Glucose Whole blood 202 mg/dL     Glucose Whole Blood - POCT  [[id number]]  (Abnormal) Collected:  04/09/18 1651     Updated:  04/09/18 1711     POCT - Glucose Whole blood 201 mg/dL     Glucose Whole Blood - POCT [[id number]]  (Abnormal) Collected:  04/09/18 1151     Updated:  04/09/18 1237     POCT - Glucose Whole blood 182 mg/dL             Rads:   Radiological Procedure reviewed.   Knee xrays:Moderate tricompartmental degenerative changes, appearing worse in the  medial compartment compared to prior but  the projection is different. No  visualized fracture or dislocation. Redemonstrated moderate to large  joint effusion. Extensive vascular calcifications.    IMPRESSION:        1. Moderate check for metal degenerative changes. Apparent worsening of  the medial compartment joint space loss may be in part projectional.    2. Demonstrated moderate to large joint effusion.    Lytle Michaels, MD   04/09/2018 6:09 AM      Signed by: Sherin Quarry

## 2018-04-10 NOTE — PT Eval Note (Signed)
[facility]   Physical Therapy Evaluation   Patient: Marco Bruce    MRN#: [id number]   Unit: [facility] TOWER 6  Bed: F628/F628.01    Post Acute Care Therapy Recommendations:   Discharge Recommendations: SNF     Milestones to be reached to achieve recommendation: NA    DME Recommended for Discharge: (TBD at rehab)    If SNF recommended discharge disposition is not available, patient will need max assist for mobility and most ADL's, TBD equipment, and return to home with full assist during all OOB tasks as pt with inability to ambulate at this time.     Therapy discharge recommendations may change with patient status.  Please refer to most recent note for up-to-date recommendations.    Assessment:   Significant Findings: severe pain to B knees with L worse than R, poor full extension at B knees    Marco Bruce is a 85 y.o. male admitted 04/08/2018.  Patient presents with significant pain to B knees and poor ability to tolerate ROM into extension.  Pt with good strength at BUE except with grip.  Needing max A for supine to sit to EOB and tolerated minimal therex.  Pt refusing to attempt to stand due to severity of pain and inability to extend at knees to perform.  Returned to supine and placed in bed/chair position with hips positioned in neutral as pt tends to roll into ER bilaterally.  Educated pt's dtr in need to work on knee extension and positioning.  Pt will need improved pain control to attempt any standing trials.    Impairments: Assessment: Decreased UE ROM;Decreased LE ROM;Decreased UE strength;Decreased LE strength;Decreased endurance/activity tolerance;Impaired motor control;Decreased functional mobility;Decreased balance;Gait impairment.     Therapy Diagnosis: impaired mobility    Rehabilitation Potential: Prognosis: Fair;With continued PT status post acute discharge    Treatment Activities: Evaluation, bed mobility, transfers, gait, neuro re-ed, therex, safety education    Educated the  patient and dtr to role of physical therapy, plan of care, goals of therapy and HEP, safety with mobility and ADLs, home safety.    Plan:   Treatment/Interventions: Exercise, Neuromuscular re-education, Functional transfer training, LE strengthening/ROM, Patient/family training, Equipment eval/education, Bed mobility, Compensatory technique education     PT Frequency: 2-3x/wk   Risks/Benefits/POC Discussed with Pt/Family: With patient/family        Precautions and Contraindications:   Weight Bearing Status: no restrictionsfalls    Activity Orders:  PMP    Consult received for Marco Bruce for PT Evaluation and Treatment.  Patients medical condition is appropriate for Physical therapy intervention at this time.    Medical Diagnosis: Other chronic pain [G89.29]  Renal failure, unspecified chronicity [N19]    History of Present Illness:   Marco Bruce is a 85 y.o. male admitted on 04/08/2018 with worsening severe pain in knees that shoots to groin.  MD's diagnosing with acute on chronic gout.      Past Medical/Surgical History:  Past Medical History:   Diagnosis Date    Acute diastolic heart failure 94/76/5465    Acute on chronic diastolic congestive heart failure 08/13/2016    Acute pulmonary edema 05/26/2015    Chronic diastolic heart failure 03/54/6568    Chronic gout     Chronic pain syndrome 07/07/2015    CKD (chronic kidney disease) stage 4, GFR 15-29 ml/min     2018 baseline creatinine in EPIC is 1.5-2.3.    Congestive heart failure (CHF)     EF  40% 5/18 echo, on lasix    Current chronic use of systemic steroids     For Polymyalgia rheumatica    Essential hypertension     Mixed hyperlipidemia     NSTEMI (non-ST elevated myocardial infarction) 08/13/2016    Osteoarthritis of both knees     Peripheral edema 06/16/2015    Pneumonia due to infectious organism, unspecified laterality, unspecified part of lung 06/25/2015    Polymyalgia rheumatica     chronic steroids    Renovascular hypertension 08/04/2015     Second degree heart block by electrocardiogram (ECG) 04/27/2015    Type 2 diabetes mellitus with complication, with long-term current use of insulin     12/01/2016 Hemoglobin A1c 8.2%     Past Surgical History:   Procedure Laterality Date    ABDOMINAL SURGERY      CHOLECYSTECTOMY      PACEMAKER Left 2017       X-Rays/Tests/Labs:  Xr Knee 1 Or 2 Views Left    Result Date: 04/09/2018   1. Moderate check for metal degenerative changes. Apparent worsening of the medial compartment joint space loss may be in part projectional. 2. Demonstrated moderate to large joint effusion. Lytle Michaels, MD 04/09/2018 6:09 AM    Xr Knee 1 Or 2 Views Right    Result Date: 04/09/2018   Moderate to severe degenerative changes. Persistent moderate joint effusion. Lytle Michaels, MD 04/09/2018 6:11 AM    Ct Head Wo Contrast    Result Date: 04/08/2018   No acute intracranial abnormality is seen.     [name], MD 04/08/2018 9:52 PM    Ct Chest Without Contrast    Result Date: 04/09/2018  No acute abnormality. Nonacute findings detailed above. Loyal Buba, MD 04/09/2018 1:26 AM    Xr Chest  Ap Portable    Result Date: 04/08/2018    Cardiomegaly. Virl Diamond, MD 04/08/2018 8:47 PM    Xr Pelvis Limited 1 Or 2 Views    Result Date: 04/09/2018   No acute abnormality. Loyal Buba, MD 04/09/2018 1:48 AM    US Venous Duplex Doppler Leg Bilateral    Result Date: 04/09/2018   No deep venous thrombosis of the BILATERAL lower extremities. Lytle Michaels, MD 04/09/2018 2:46 AM    Lab Results   Component Value Date/Time    HGB 11.2 (L) 04/09/2018 08:48 AM          HCT 35.1 (L) 04/09/2018 08:48 AM    K 4.8 04/10/2018 08:19 AM    NA 136 04/10/2018 08:19 AM    INR 1.2 (H) 04/09/2018 08:48 AM     Social History:      Prior Level of Function:   Pt has home health services 7days/week  Pt needs assist to do stairs at home.  Per chart, family asking for motorized WC to transfer pt up/down stairs  Pt has been needing full assist for ADL's  Can ambulate with use of walker  but has not for ~2 weeks per dtr  Has WC, rollator, BSC at home    Home Living Arrangements:  Living Arrangements: Children   2 story house with stairs    Subjective: "Thank you very much"   Patient is agreeable to participation in the therapy session. Family and/or guardian are agreeable to patient's participation in the therapy session. Nursing clears patient for therapy.     Patient Goal: get rid of the pain    Pain Assessment  Pain Assessment:  Numeric Scale (0-10)  Pain Score: 10-severe pain  Pain Location: Knee(bilateral with L worse than R)  Pain Intervention(s): Repositioned;Medication (See eMAR)    Objective:   Observation of Patient/Vital Signs:  Patient is in bed with telemetry and peripheral IV in place.    Observation of Patient/Vital signs:    Vital signs:  stable    Inspection/Posture: supine in bed with bilateral hips in ER and knees flexed    Cognition/Neuro Status  Arousal/Alertness: Appropriate responses to stimuli  Attention Span: Appears intact  Orientation Level: Oriented X4  Memory: Appears intact  Following Commands: Follows multistep commands without difficulty  Safety Awareness: unable to assess  Problem Solving: Unable to assess  Behavior: calm;cooperative  Motor Planning: intact  Coordination: intact    Sensation:  Unable to assess  Vision:  Intact to baseline    Musculoskeletal Examination:  Gross ROM  Right Upper Extremity ROM: (grossly WFL except grip limited)  Left Upper Extremity ROM: (Grossly WFL except limited grip)  Right Lower Extremity ROM: (hip limited with IR, knee 0-45-90)  Left Lower Extremity ROM: (hip limited IR, knee 0-60-90)    Gross Strength  Right Upper Extremity Strength: (shdr and elbow ~4/5, grip 3-/5)  Left Upper Extremity Strength: (shdr and elbow ~4/5, grip 3-/5)  Right Lower Extremity Strength: (hip 3-/5, knee unable to assess due to pain, ankle 3/5)  Left Lower Extremity Strength: (hip 3-/5, knee unable to assess due to pain, ankle 3/5)    Tone  Tone: within  functional limits    Functional Mobility:  Rolling: Maximal Assist  Supine to Sit: Maximal Assist  Sit to Supine: Maximal Assist  Sit to Stand: Unable to assess (Comment)(pt with too much pain to perform)    Transfers  Bed to Chair: Unable to assess (Comment)    Ambulation:  PMP - Progressive Mobility Protocol   PMP Activity: Step 4 - Dangle at Bedside  Ambulation: Unable to assess (Comment)    Balance:  Balance: needs focused assessment  Sitting - Static: (initially fair- but improved to fair and short ability to maintain midline with supervision)  Sitting - Dynamic: Poor(falls posteriorly with any leg movement)         Participation and Activity Tolerance:  Participation Effort: fair  Endurance: Tolerates < 10 min exercise, no significant change in vital signs    AM-PAC Inpatient Short Forms  Inpatient AM-PAC Performed? (PT): Basic Mobility Inpatient Short Form  AM-PAC "6 Clicks" Basic Mobility Inpatient Short Form  Turning Over in Bed: A lot  Sitting Down On/Standing From Armchair: Unable  Lying on Back to Sitting on Side of Bed: A lot  Assist Moving to/from Bed to Chair: Total  Assist to Walk in Hospital Room: Total  Assist to Climb 3-5 Steps with Railing: Total  PT Basic Mobility Raw Score: 8  CMS 0-100% Score: 86.62%    Patient left with call bell within reach, all needs met, SCDs off(not present in room), fall mat in place, bed alarm on, and all questions answered. RN notified of session outcome and patient response.     Goals:   Goals  Goal Formulation: With patient/family  Time for Goal Acheivement: 5 visits  Pt Will Go Supine To Sit: with minimal assist  Pt Will Achieve Sitting Balance: (in unsupported position for >5 minutes without LOB)  Pt Will Perform Sit to Stand: with moderate assist  Pt Will Transfer Bed/Chair: with moderate assist  Pt Will Perform Home Exer Program: with minimal assist  Pt  Will Increase ROM: By 25 degrees(at bilateral knees)     Dion Body, PT 78:41 AM 05/14/2079  Pager [phone]      Time of treatment:   PT Received On: 04/10/18  Start Time: 9597  Stop Time: 1005  Time Calculation (min): 40 min

## 2018-04-10 NOTE — Progress Notes (Signed)
Adult Observation Progress Note    Shift note: Patient received to shift at 0700. Bedside report received from outgoing RN.  Introduced self to patient and family. White board updated.  VSS and A&Ox4. No signs of distress at this time.  Pt rates pain 0/10. IV in place, infusing Plasma Lyte 75 ml/hr. Tele in place with NSR. Moderate Fall level with precautions in place. Patient is bedfast. Ace wrap applied to left knee per order.     Patient is [place] speaking ([place] dialect) Patient signed waiver for grandson to interpret. Waiver in chart.     BM during shift:  No, last BM yesterday 04/10/2018    Pending Orders: Rheumatology, I/Os, 1200 ml fluid restriction     Discharge Plan:  TBD    Social/Family Visits:  Family at bedside    POC update: Patient updated on plan of care, verbalized understanding.       Vitals:    04/10/18 0916 04/10/18 1015 04/10/18 1120 04/10/18 1832   BP: 130/71 (!) 207/80 131/75 109/55   Pulse: 78 98 72 68   Resp:   19 16   Temp:   98.2 F (36.8 C)    TempSrc:   Oral    SpO2:  96% 96% 95%   Weight:       Height:           Patient Lines/Drains/Airways Status    Active Lines, Drains and Airways     Name:   Placement date:   Placement time:   Site:   Days:    Peripheral IV 04/08/18 Left Antecubital   04/08/18    2214    Antecubital   1         Inactive Lines, Drains and Airways     None

## 2018-04-11 ENCOUNTER — Encounter (INDEPENDENT_AMBULATORY_CARE_PROVIDER_SITE_OTHER): Payer: Self-pay | Admitting: Family Medicine

## 2018-04-11 LAB — CBC AND DIFFERENTIAL
Absolute NRBC: 0 10*3/uL (ref 0.00–0.00)
Basophils Absolute Automated: 0 10*3/uL (ref 0.00–0.08)
Basophils Automated: 0 %
Eosinophils Absolute Automated: 0 10*3/uL (ref 0.00–0.44)
Eosinophils Automated: 0 %
Hematocrit: 28.7 % — ABNORMAL LOW (ref 37.6–49.6)
Hgb: 9.5 g/dL — ABNORMAL LOW (ref 12.5–17.1)
Immature Granulocytes Absolute: 0.04 10*3/uL (ref 0.00–0.07)
Immature Granulocytes: 0.6 %
Lymphocytes Absolute Automated: 0.9 10*3/uL (ref 0.42–3.22)
Lymphocytes Automated: 12.9 %
MCH: 29.9 pg (ref 25.1–33.5)
MCHC: 33.1 g/dL (ref 31.5–35.8)
MCV: 90.3 fL (ref 78.0–96.0)
MPV: 11.8 fL (ref 8.9–12.5)
Monocytes Absolute Automated: 0.42 10*3/uL (ref 0.21–0.85)
Monocytes: 6 %
Neutrophils Absolute: 5.63 10*3/uL (ref 1.10–6.33)
Neutrophils: 80.5 %
Nucleated RBC: 0 /100 WBC (ref 0.0–0.0)
Platelets: 169 10*3/uL (ref 142–346)
RBC: 3.18 10*6/uL — ABNORMAL LOW (ref 4.20–5.90)
RDW: 15 % (ref 11–15)
WBC: 6.99 10*3/uL (ref 3.10–9.50)

## 2018-04-11 LAB — BASIC METABOLIC PANEL
BUN: 77 mg/dL — ABNORMAL HIGH (ref 9.0–28.0)
CO2: 20 mEq/L — ABNORMAL LOW (ref 22–29)
Calcium: 8.4 mg/dL (ref 7.9–10.2)
Chloride: 101 mEq/L (ref 100–111)
Creatinine: 2.8 mg/dL — ABNORMAL HIGH (ref 0.7–1.3)
Glucose: 329 mg/dL — ABNORMAL HIGH (ref 70–100)
Potassium: 4.3 mEq/L (ref 3.5–5.1)
Sodium: 134 mEq/L — ABNORMAL LOW (ref 136–145)

## 2018-04-11 LAB — GLUCOSE WHOLE BLOOD - POCT: Whole Blood Glucose POCT: 250 mg/dL — ABNORMAL HIGH (ref 70–100)

## 2018-04-11 LAB — GFR: EGFR: 21.4

## 2018-04-11 LAB — HEMOGLOBIN A1C
Average Estimated Glucose: 131.2 mg/dL
Hemoglobin A1C: 6.2 % — ABNORMAL HIGH (ref 4.6–5.9)

## 2018-04-11 MED ORDER — INSULIN GLARGINE 100 UNIT/ML SC SOLN
25.00 [IU] | Freq: Every evening | SUBCUTANEOUS | Status: DC
Start: 2018-04-11 — End: 2018-04-11

## 2018-04-11 MED ORDER — PREDNISONE 5 MG PO TABS
ORAL_TABLET | ORAL | 0 refills | Status: DC
Start: 2018-04-11 — End: 2018-04-26

## 2018-04-11 MED ORDER — INSULIN GLARGINE 100 UNIT/ML SC SOLN
10.00 [IU] | Freq: Once | SUBCUTANEOUS | Status: AC
Start: 2018-04-11 — End: 2018-04-11
  Administered 2018-04-11: 06:00:00 10 [IU] via SUBCUTANEOUS
  Filled 2018-04-11: qty 10

## 2018-04-11 NOTE — OT Eval Note (Signed)
[facility]   Occupational Therapy Evaluation     Patient: Marco Bruce    MRN#: [id number]   Unit: [facility] TOWER 6  Bed: F628/F628.01                                     Post Acute Care Therapy Recommendations:   Discharge Recommendations: Home with supervision, Home with home health OT, previous assist from grandson    Milestones to be reached to achieve recommendation: n/a  Anticipate achievement in n/a sessions    DME Recommended for Discharge: (in place)    If Home with supervision, Home with home health OT  recommended discharge disposition is not available, patient will need SNF.     Therapy discharge recommendations may change with patient status.  Please refer to most recent note for up-to-date recommendations.    Assessment:   Significant Findings: none    Marco Bruce is a 85 y.o. male admitted 04/08/2018 with worsening knee pain and unable to ambulate. Patient presents with improving knee pain, remains unable to ambulate. Pt sits EOB with min A maintaining balance with SBA. Pt unable to scoot towards [facility] requiring boost from grandson and therapist. Pt's grandson present throughout, reports he feels comfortable with stand pivot t/f and declines practice- reports he assists pt with all ADLs and has been trained on correct form for all transfers. Pt tolerating more activity and will benefit from OT services to address functional deficits and maximize pt independence and safety.     Therapy Diagnosis: decrease in ADLs    Rehabilitation Potential: good for OT goals    Treatment Activities: therapeutic act, patient/family education, activity tolerance  Educated the patient to role of occupational therapy, plan of care, goals of therapy and safety with mobility and ADLs.    Plan:   OT Frequency Recommended: 2-3x/wk     Treatment/Interventions: therapeutic act, activity tolerance, patient/family education, ADL retraining    Risks/benefits/POC discussed with patient and patient's grandson         Precautions and Contraindications:   [place] speaking- grandson translates    Consult received for Marco Bruce for OT Evaluation and Treatment.  Patients medical condition is appropriate for Occupational Therapy intervention at this time.      History of Present Illness:    Marco Bruce is a 85 y.o. male admitted on 04/08/2018 with worsening knee pain unable to ambulate.    Admitting Diagnosis: Other chronic pain [G89.29]  Renal failure, unspecified chronicity [N19]    Past Medical/Surgical History:  DM2, polymalgia rheumatica, OA knees, NSTEMI, chronic pain syndrome    Imaging/Tests/Labs:    1/5: XR L Knee  IMPRESSION:        1. Moderate check for metal degenerative changes. Apparent worsening of  the medial compartment joint space loss may be in part projectional.    2. Demonstrated moderate to large joint effusion.    1/5: XR R Knee  IMPRESSION:        Moderate to severe degenerative changes. Persistent moderate joint  effusion.    Social History:   Prior Level of Function: stand pivot for t/f with grandson past couple weeks, assist ADLs  Assistive Devices: stand pivot, was using rollator  Baseline Activity: minimal household mobility  DME Currently at Home: w/c, rollator, tub t/f bench, BSC, grab bar  Home Living Arrangements: with family, grandson with pt all day  Type of Home: house  Home Layout: multi-level, tub/shower with tub t/f bench    Subjective: "He's doing better today." -pt's grandson    Patient is agreeable to participation in the therapy session. Family and/or guardian are agreeable to patient's participation in the therapy session.     Patient Goal: walk again  Pain:   Scale: Not rated, 5/10 FACEs scale  Location: bilateral knees  Intervention: RN premedicated    Objective:   Patient is in bed with peripheral IV in place.      Cognitive Status and Neuro Exam:    Alert & Oriented x3  Safety Awareness: intact  Behavior: cooperative  Following Commands: appropriate  Motor Planning: intact  Problem  Solving: intact      Musculoskeletal Examination  RUE ROM: shoulder up to 90 degrees  LUE ROM: shoulder up to 90 degrees  RLE ROM: dec knee extension 2/2 arthritis/pain  LLE ROM: "    RUE Strength: WFL  LUE Strength: WFL  RLE Strength: observed 3/5 grossly  LLE Strength: observed 3/5 grossly    Motor Coordination: WFL    Sensory/Oculomotor Examination  Auditory: intact to conversational speech  Vision: per grandson, vision is "50%"    Activities of Daily Living  Eating: setup  Grooming: setup  Bathing: mod A  UE Dressing: mod A  LE Dressing: max A  Toileting: max A    Functional Mobility:  Supine to Sit: min A  Sit to Stand: unable to stand 2/2 pain  Transfers: pt and pt's son decline stand pivot practice    PMP Activity: Step 4 - Dangle at Bedside      Balance  Static Sitting: SBA  Dynamic Sitting: CGA  Static Standing: pt declines 2/2 pain  Dynamic Standing: "    Participation and Activity Tolerance  Participation Effort: good  Endurance: good-    Patient left with call bell within reach, all needs met, SCDs not in use, fall mat in place, bed alarm on, chair alarm n/a and all questions answered. RN notified of session outcome and patient response.       Goals:  Time For Goal Achievement: 5 visits  ADL Goals  Patient will dress upper body: Supervision  Mobility and Transfer Goals  Pt will perform functional transfers: Moderate Assist                         Marco Bruce, OTR/L  #[id number]         Time of treatment:   OT Received On: 04/11/18  Start Time: 0905  Stop Time: 0935  Time Calculation (min): 30 min

## 2018-04-11 NOTE — Plan of Care (Signed)
Marco Bruce  [id number]  F628/F628.01  04/11/18  5:46 AM        AM Labs reviewed.  BG still ranging in 300s, despite increase in home Lantus from 10 to 15 units.  Currently on Prednisone for presumed gout flare    Plan   Increase Lantus to 25 units QHS   Give 10 units this morning to help with hyperglycemia (received 15 units last night)   Can consider premeal humalog if persistent hyperglycemia       Marco Abbey, FNP  [facility]   [phone] or [phone] on NT6

## 2018-04-11 NOTE — Discharge Instructions (Signed)
Treating Gout Attacks     Raising the joint above the level of your heart can help reduce gout symptoms.     Gout is a disease that affects the joints. It is caused by excess uric acid in your blood that may lead to crystals forming in your joints. Left untreated, it can lead to painful foot and joint deformities and even kidney problems. But, by treating gout early, you can relieve pain and help prevent future problems. Gout can usually be treated with medicine and proper diet. In severe cases, surgery may be needed.  Gout attacks are painful and often happen more than once. Taking medicines may reduce pain and prevent attacks in the future. There are also some things you can do at home to relieve symptoms.  Medicines for gout  Your healthcare provider may prescribe a daily medicine to reduce levels of uric acid. Reducing your uric acid levels may help prevent gout attacks. Allopurinol is one commonly used medicine takendaily to reduce uric acid levels. Other daily medicines used to reduce uric acid levels include febuxostat, lesinurad, and probencid. Other medicines can help relieve pain and swelling during an acuteattack.Medicines such as NSAIDs (nonsteroidal anti-inflammatory medicines), steroids, and colchicine may be prescribed for intermittent use to relieve an acute gout attack. Be sure to take your medicine as directed.  What you can do  Below are some things you can do at home to relieve gout symptoms. Your healthcare provider may have other tips.   Rest the painful joint as much as you can.   Raise the painful joint so it is at a level higher than your heart.   Use ice for 10 minutes every 1 to 2 hours as possible.  How can I prevent gout?  With a little effort, you may be able to prevent gout attacks in the future. Here are some things you can do:   Don't eat foods high in purines  ? Certain meats (red meat, processed meat, [place])  ? Organ meats (kidney, liver, sweetbread)  ? Shellfish  (lobster, crab, shrimp, scallop, mussel)  ? Certain fish (anchovy, sardine, herring, mackerel)   Take any medicines prescribed by your healthcare provider.   Lose weight if you need to.   Reduce high fructose corn syrup in meals and drinks.   Reduce or cut out alcohol, particularly beer, but also red wine and spirits.   Control blood pressure, diabetes, and cholesterol.   Drink plenty of water to help flush uric acid from your body.  [employer] last reviewed this educational content on 07/04/2016   2000-2019 [employer]. [address], [place] [address]. All rights reserved. This information is not intended as a substitute for professional medical care. Always follow your healthcare professional's instructions.        Prednisone tablets  Brand Names: Deltasone, Predone, Sterapred, Sterapred DS  What is this medicine?  PREDNISONE (PRED ni sone) is a corticosteroid. It is commonly used to treat inflammation of the skin, joints, lungs, and other organs. Common conditions treated include asthma, allergies, and arthritis. It is also used for other conditions, such as blood disorders and diseases of the adrenal glands.  How should I use this medicine?  Take this medicine by mouth with a glass of water. Follow the directions on the prescription label. Take this medicine with food. If you are taking this medicine once a day, take it in the morning. Do not take more medicine than you are told to take.  Do not suddenly stop taking your medicine because you may develop a severe reaction. Your doctor will tell you how much medicine to take. If your doctor wants you to stop the medicine, the dose may be slowly lowered over time to avoid any side effects.  Talk to your pediatrician regarding the use of this medicine in children. Special care may be needed.  What side effects may I notice from receiving this medicine?  Side effects that you should report to your doctor or health care professional as soon  as possible:   allergic reactions like skin rash, itching or hives, swelling of the face, lips, or tongue   changes in emotions or moods   changes in vision   depressed mood   eye pain   fever or chills, cough, sore throat, pain or difficulty passing urine   increased thirst   swelling of ankles, feet  Side effects that usually do not require medical attention (report to your doctor or health care professional if they continue or are bothersome):   confusion, excitement, restlessness   headache   nausea, vomiting   skin problems, acne, thin and shiny skin   trouble sleeping   weight gain  What may interact with this medicine?  Do not take this medicine with any of the following medications:   metyrapone   mifepristone  This medicine may also interact with the following medications:   aminoglutethimide   amphotericin B   aspirin and aspirin-like medicines   barbiturates   certain medicines for diabetes, like glipizide or glyburide   cholestyramine   cholinesterase inhibitors   cyclosporine   digoxin   diuretics   ephedrine   male hormones, like estrogens and birth control pills   isoniazid   ketoconazole   NSAIDS, medicines for pain and inflammation, like ibuprofen or naproxen   phenytoin   rifampin   toxoids   vaccines   warfarin  What if I miss a dose?  If you miss a dose, take it as soon as you can. If it is almost time for your next dose, talk to your doctor or health care professional. You may need to miss a dose or take an extra dose. Do not take double or extra doses without advice.  Where should I keep my medicine?  Keep out of the reach of children.  Store at room temperature between 15 and 30 degrees C (59 and 86 degrees F). Protect from light. Keep container tightly closed. Throw away any unused medicine after the expiration date.  What should I tell my health care provider before I take this medicine?  They need to know if you have any of these  conditions:   Cushing's syndrome   diabetes   glaucoma   heart disease   high blood pressure   infection (especially a virus infection such as chickenpox, cold sores, or herpes)   kidney disease   liver disease   mental illness   myasthenia gravis   osteoporosis   seizures   stomach or intestine problems   thyroid disease   an unusual or allergic reaction to lactose, prednisone, other medicines, foods, dyes, or preservatives   pregnant or trying to get pregnant   breast-feeding  What should I watch for while using this medicine?  Visit your doctor or health care professional for regular checks on your progress. If you are taking this medicine over a prolonged period, carry an identification card with your name and address, the type and  dose of your medicine, and your doctor's name and address.  This medicine may increase your risk of getting an infection. Tell your doctor or health care professional if you are around anyone with measles or chickenpox, or if you develop sores or blisters that do not heal properly.  If you are going to have surgery, tell your doctor or health care professional that you have taken this medicine within the last twelve months.  Ask your doctor or health care professional about your diet. You may need to lower the amount of salt you eat.  This medicine may affect blood sugar levels. If you have diabetes, check with your doctor or health care professional before you change your diet or the dose of your diabetic medicine.  NOTE:This sheet is a summary. It may not cover all possible information. If you have questions about this medicine, talk to your doctor, pharmacist, or health care provider. Copyright 2019 [employer]

## 2018-04-11 NOTE — Progress Notes (Signed)
PROGRESS NOTE    Date Time: 04/11/18 9:23 AM  Patient Name: [name]      Assessment:   Marco Bruce is a 85 y.o. male with PMH of HTN, DM, HLD, CHF, and known gout admitted with b/l knee swelling and pain.     Plan:   1. B/l acute knee pain/swelling  -xrays with DJD and moderate effusions  -symptoms c/w previous gout flares, no fevers/chills and given b/l unlikely infectious  -improving with steroids, continueprednisone 30mg /day x2 moredays, 20mg /day x 3 days, 10mg /d x 3 days then would continue prednisone at 5mg /day to be used as ppx while eventually escalating urate lowering therapy. Will need f/up in ~ 2 weeks  -cont allopurinol at his home dose for now (100mg /d), stopping, starting or changing urate lowering meds during a flare can worsen flare  -avoid colchicine given renal dysfunction    2. Chronic gout/hyperuricemia  -cont allopurinol home dose of allopurinol 100mg /d  -urate not at goal, currently 10.3  -will need increase dose of allopurinol or to switch to uloric but not during acute flareas can worsen flare-will need to be done outpt  -low purine diet  -pt to f/up with outpt rheumatology for further management    3. CKD  -avoid nsaids/colchicine  -hyperuricemia can contribute to renal ds    4. R>L knee OA  -has chronic R knee pain and limitation due to severe OA  -worse now with gout flare  -cont above management  -cont PT    5. High risk medication use  -with steroids, start ca/vitD for bone health  -with steroids start GI ppx  -monitor BS /BP with steroids    Pt to f/up with outpt rheumatology 1-2 weeks after discharge  Discussed plan with pt, daughter in room and other daughter over phone    Subjective:   Pt reports improvement of b/l knee pain and swelling. R knee still more painful but attributes to his known OA pain too. No other new joint pain    Medications:     Current Facility-Administered Medications   Medication Dose Route Frequency    allopurinol  100 mg Oral Daily    aspirin  81 mg  Oral Daily    atorvastatin  40 mg Oral QHS    furosemide  40 mg Oral Daily    heparin (porcine)  5,000 Units Subcutaneous Q8H [place]    insulin glargine  25 Units Subcutaneous QHS    insulin lispro  1-5 Units Subcutaneous TID AC    metoprolol succinate XL  12.5 mg Oral Daily    pantoprazole  40 mg Oral QAM AC    predniSONE  30 mg Oral Daily    senna-docusate  2 tablet Oral BID    sodium bicarbonate  1,300 mg Oral BID       Review of Systems:   All systems reviewed and negative except as per HPI     Physical Exam:     Vitals:    04/11/18 0658   BP: 110/66   Pulse: 67   Resp: 18   Temp: 97.9 F (36.6 C)   SpO2: 97%       Intake and Output Summary (Last 24 hours) at Date Time    Intake/Output Summary (Last 24 hours) at 04/11/2018 0923  Last data filed at 04/11/2018 0300  Gross per 24 hour   Intake 700 ml   Output 1121 ml   Net -421 ml       General appearance - alert, well  appearing, and in no distress  Mental status - alert, oriented to person, place, and time  Chest - clear to auscultation, no wheezes, rales or rhonchi, symmetric air entry  Heart - normal rate, regular rhythm, normal S1, S2, no murmurs, rubs, clicks or gallops  Abdomen - soft, nontender, nondistended, no masses or organomegaly  Musculoskeletal - less swollen/tender knees. No warmth or erythema.   Extremities - peripheral pulses normal, no pedal edema, no clubbing or cyanosis  Skin - no tophi noted    Labs:     Results     Procedure Component Value Units Date/Time    Glucose Whole Blood - POCT [[id number]]  (Abnormal) Collected:  04/11/18 0658     Updated:  04/11/18 0713     POCT - Glucose Whole blood 250 mg/dL     Hemoglobin A1C [[id number]]  (Abnormal) Collected:  04/11/18 0305    Specimen:  Blood Updated:  04/11/18 0526     Hemoglobin A1C 6.2 %      Average Estimated Glucose 131.2 mg/dL     Narrative:       This is NOT the correct Test for Patients with  Hemoglobinopathy.    Basic Metabolic Panel [[id number]]  (Abnormal) Collected:  04/11/18 0305     Specimen:  Blood Updated:  04/11/18 0426     Glucose 329 mg/dL      BUN 77.0 mg/dL      Creatinine 2.8 mg/dL      Calcium 8.4 mg/dL      Sodium 134 mEq/L      Potassium 4.3 mEq/L      Chloride 101 mEq/L      CO2 20 mEq/L     Narrative:       This is NOT the correct Test for Patients with  Hemoglobinopathy.    GFR [[id number]] Collected:  04/11/18 0305     Updated:  04/11/18 0426     EGFR 21.4    Narrative:       This is NOT the correct Test for Patients with  Hemoglobinopathy.    CBC and differential [[id number]]  (Abnormal) Collected:  04/11/18 0305    Specimen:  Blood Updated:  04/11/18 0400     WBC 6.99 x10 3/uL      Hgb 9.5 g/dL      Hematocrit 28.7 %      Platelets 169 x10 3/uL      RBC 3.18 x10 6/uL      MCV 90.3 fL      MCH 29.9 pg      MCHC 33.1 g/dL      RDW 15 %      MPV 11.8 fL      Neutrophils 80.5 %      Lymphocytes Automated 12.9 %      Monocytes 6.0 %      Eosinophils Automated 0.0 %      Basophils Automated 0.0 %      Immature Granulocyte 0.6 %      Nucleated RBC 0.0 /100 WBC      Neutrophils Absolute 5.63 x10 3/uL      Abs Lymph Automated 0.90 x10 3/uL      Abs Mono Automated 0.42 x10 3/uL      Abs Eos Automated 0.00 x10 3/uL      Absolute Baso Automated 0.00 x10 3/uL      Absolute Immature Granulocyte 0.04 x10 3/uL      Absolute NRBC 0.00 x10 3/uL  Narrative:       This is NOT the correct Test for Patients with  Hemoglobinopathy.    Glucose Whole Blood - POCT [[id number]]  (Abnormal) Collected:  04/10/18 2105     Updated:  04/10/18 2109     POCT - Glucose Whole blood 306 mg/dL     Glucose Whole Blood - POCT [[id number]]  (Abnormal) Collected:  04/10/18 1625     Updated:  04/10/18 1630     POCT - Glucose Whole blood 349 mg/dL     Glucose Whole Blood - POCT [[id number]]  (Abnormal) Collected:  04/10/18 1119     Updated:  04/10/18 1135     POCT - Glucose Whole blood 304 mg/dL               Rads:   Radiological Procedure reviewed.    Signed by: Sherin Quarry

## 2018-04-11 NOTE — Discharge Summary (Signed)
PROGRESS NOTE/DISCHARGE SUMMARY      Date Time: 04/11/18 9:29 AM  Patient Name: Marco Bruce, Marco Bruce  DOB: November 27, 1926  MRN: [id number]  Attending Physician: Particia Jasper, MD  Primary Care Physician: Danella Sensing, MD  Date of Admission:   04/08/2018  Date of Discharge:    04/11/18   Discharge Dx:   Principal Diagnosis (Diagnosis after study, that is chiefly responsible for admission to observation status): Acute gout of knee    Active Hospital Problems    Diagnosis POA    Principal Problem: Acute gout of knees Yes    Chronic pain Yes     Chronic    Pacemaker Not Applicable     Chronic    T2DM (type 2 diabetes mellitus) Yes     Chronic    Essential hypertension Yes     Chronic    CKD (chronic kidney disease), stage IV Yes     Chronic      Resolved Hospital Problems    Diagnosis POA    CHF (congestive heart failure) Yes     Chronic    OA (osteoarthritis) Yes     Chronic     Disposition:  home     Discharge MEDICATIONS        Medication List      CHANGE how you take these medications    insulin glargine 100 UNIT/ML injection pen  What changed:  how much to take     predniSONE 5 MG tablet  Commonly known as:  DELTASONE  Starting 1/8: Take 20 mg x 3 days, then on 1/11: take 10 mg x 3 days. Then continue taking 5 mg daily until you receive further instructions  What changed:     medication strength   additional instructions        CONTINUE taking these medications    albuterol-ipratropium 2.5-0.5(3) mg/3 mL nebulizer  Commonly known as:  DUO-NEB  Take 3 mLs by nebulization 2 (two) times daily as needed (SOB)     allopurinol 100 MG tablet  Commonly known as:  ZYLOPRIM  Take 1 tablet (100 mg total) by mouth daily     aspirin 81 MG chewable tablet  Chew 1 tablet (81 mg total) by mouth daily     atorvastatin 40 MG tablet  Commonly known as:  LIPITOR  Take 1 tablet (40 mg total) by mouth nightly     furosemide 40 MG tablet  Commonly known as:  LASIX  Take 1 tablet (40 mg total) by mouth daily     gabapentin 100 MG  capsule  Commonly known as:  NEURONTIN  TAKE ONE CAPSULE BY MOUTH EVERY 8 HOURS     linaGLIPtin 5 MG Tabs  Take 1 tablet (5 mg total) by mouth daily     metoprolol succinate XL 25 MG 24 hr tablet  Commonly known as:  TOPROL-XL  Take 0.5 tablets (12.5 mg total) by mouth daily     pantoprazole 40 MG tablet  Commonly known as:  PROTONIX  Take 1 tablet (40 mg total) by mouth nightly     sodium bicarbonate 650 MG tablet  Take 2 tablets (1,300 mg total) by mouth 2 (two) times daily           Where to Get Your Medications      These medications were sent to [id number] - [place], [place] [address]  [address], [place] [address]    Phone:  [phone]  predniSONE 5 MG tablet       Pending Results, Recommendations & Instructions to providers after discharge:   No labs or tests pending at time of discharge.     Instructions given to patient at time of discharge:    1. Please call your PCP & schedule follow up visit after hospital stay. We recommend that you follow up in 1-2 weeks.   2. Knee pain is likely due to gout flare, you were given dose of Colchicine (anti-gout medication) and started on Prednisone taper (gradually decreasing dose).   3. Starting 1/8 you will take Prednisone 20 mg x 3 days, then on 1/11 you will take 10 mg x 3 days. After this, you should continue 5 mg daily until Rheumatologist gives you further instructions.   4. Prednisone medication can be irritating to stomach, and cause insomnia. Take with food/ milk; and try to take with breakfast or early in the day. Continue taking Protonix to help protect stomach.   5. Prednisone increases your blood sugar, so while on higher doses of Prednisone; we recommend increasing your dose of Lantus to 25 units. Carefully monitor your blood sugar while on this medication.   6. PT/OT evaluated you, recommended Home Health PT/OT, to help you get your strength back. Case Management will help coordinate services if you are  interested.     Consultations:   Treatment Team: Attending Provider: Particia Jasper, MD; Nurse Practitioner: Lorette Ang, NP; Consulting Physician: Sherin Quarry, MD; Case Manager: Chyrel Masson; Respiratory Care Practitioner: Smith-Cobbs, Arnette Norris, RT; Registered Nurse: Orbie Pyo, RN; Occupational Therapist: Delanna Notice, OT   Recent Labs:   Recent Labs   Lab 04/11/18  0305   WBC 6.99   Hgb 9.5*   Hematocrit 28.7*   Platelets 169     Recent Labs   Lab 04/11/18  0305   Sodium 134*   Potassium 4.3   Chloride 101   CO2 20*   BUN 77.0*   Creatinine 2.8*   EGFR 21.4   Glucose 329*   Calcium 8.4             Invalid input(s): FREET4   No results found for: T4.                   Microbiology Results     None        Procedures/Radiology performed:   Radiology: all results from this admission  Xr Knee 1 Or 2 Views Left    Result Date: 04/09/2018   1. Moderate check for metal degenerative changes. Apparent worsening of the medial compartment joint space loss may be in part projectional. 2. Demonstrated moderate to large joint effusion. Lytle Michaels, MD 04/09/2018 6:09 AM    Xr Knee 1 Or 2 Views Right    Result Date: 04/09/2018   Moderate to severe degenerative changes. Persistent moderate joint effusion. Lytle Michaels, MD 04/09/2018 6:11 AM    Ct Head Wo Contrast    Result Date: 04/08/2018   No acute intracranial abnormality is seen.     [name], MD 04/08/2018 9:52 PM    Ct Chest Without Contrast    Result Date: 04/09/2018  No acute abnormality. Nonacute findings detailed above. Loyal Buba, MD 04/09/2018 1:26 AM    Xr Chest  Ap Portable    Result Date: 04/08/2018    Cardiomegaly. Virl Diamond, MD 04/08/2018 8:47 PM    Xr Pelvis Limited 1 Or 2 Views  Result Date: 04/09/2018   No acute abnormality. Loyal Buba, MD 04/09/2018 1:48 AM    US Venous Duplex Doppler Leg Bilateral    Result Date: 04/09/2018   No deep venous thrombosis of the BILATERAL lower extremities. Lytle Michaels, MD 04/09/2018 2:46 AM    ECHOCARDIOGRAM    Echo Results     None        Hospital Course:   Reason for admission/ HPI: Marco Bruce is a 85 y.o. male who has  has a past medical history of Acute diastolic heart failure (23/76/2831), Acute on chronic diastolic congestive heart failure (08/13/2016), Acute pulmonary edema (05/26/2015), Chronic diastolic heart failure (51/76/1607), Chronic gout, Chronic pain syndrome (07/07/2015), CKD (chronic kidney disease) stage 4, GFR 15-29 ml/min, Congestive heart failure (CHF), Current chronic use of systemic steroids, Essential hypertension, Mixed hyperlipidemia, NSTEMI (non-ST elevated myocardial infarction) (08/13/2016), Osteoarthritis of both knees, Peripheral edema (06/16/2015), Pneumonia due to infectious organism, unspecified laterality, unspecified part of lung (06/25/2015), Polymyalgia rheumatica, 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 He was admitted 04/08/2018 to observation for knee pain, ambulatory dysfunction. In ER, patient's family reported that he was non-ambulatory x several days. Lab work up revealed elevated uric acid & XRs showed bilateral knee effusions (LEFT greater than RIGHT).     Hospital Course: This is a 85 y.o. male admitted 04/08/2018 for gout flare. Case discussed with Ortho, recommended Rheum consultation. Patient was given dose of Colchicine 0.6 mg x 1 (given baseline kidney disease), started on Prednisone taper. PT/OT recommended SNF, patient will discharge with home health services. After colchicine dose, SCr increased 2.8 --> 3.3; he was started on gentle IV hydration & kept overnight for further monitoring. SCr returned to baseline with repeat labs on 1/7, Lantus dose adjusted given hyperglycemia (glucose 270- 329 x 24 hours) while on Prednisone. Prior to discharge, instructions for follow up care & hospital work up were reviewed with patient;  all questions answered to their satisfaction. Family provided assistance with translation, as patient is [place] speaking only.      D/C: Home today    Should f/u with PCP in 3-5 days.   PCP communication: IMG PCP-EPIC Inbasket message sent to update    Currently the patient is hemodynamically stable for discharge with outpatient follow up as outlined below.    Discharge Day Exam:  DISCHARGE DAY EXAM:  BP 110/66    Pulse 67    Temp 97.9 F (36.6 C) (Oral)    Resp 18    Ht 1.575 m (5\' 2" )    Wt 72.1 kg (159 lb)    SpO2 97%    BMI 29.08 kg/m   Body mass index is 29.08 kg/m.    Discharge Instructions:   Diet:: Cardiac Consistent Carbohydrates, Renal Consistent Carbohydrates, Low Saturated Fat / Low Cholesterol Diet  Activity: as tolerated    Follow-up Information     [facility].    Why:  home health services  Contact information:  [place] [address]  [phone]           Danella Sensing, MD. Schedule an appointment as soon as possible for a visit in 1 week(s).    Specialty:  Family Medicine  Why:  Follow up with PCP after hospital stay, please call to make an appointment.   Contact information:  [address] [place] [address]  [facility]. Schedule an appointment as soon as possible for a visit in 1 week(s).    Specialty:  Rheumatology  Why:  Follow up with Rheumatologist (Gout doctor) after hospital stay, please call to make an appointment.   Contact information:  [address] [place] [address]                    Signed by: Lorette Ang, NP  Discussed in detail with my attending provider: Particia Jasper, MD  [facility]   ADULT OBSERVATION UNIT [phone] F: [phone]  CC: Danella Sensing, MD  Please see attending note that follows this mid-level encounter note.

## 2018-04-11 NOTE — Progress Notes (Signed)
ADULT OBSERVATION UNIT  DISCHARGE NOTE      Date Time: 04/11/18 10:29 AM  Patient Name: [facility]  Attending Physician: Particia Jasper, MD      Date of Admission:   04/08/2018        Discharge Instructions:        Patient stable for discharge. Discharge instructions given, patient teachings done and verbalized understanding. All questions answered at this time. Aware to follow-up and schedule appointments as necessary. IV access and cardiac monitor discontinued. Discharge to home with family.  Wheelchair  Transport requested.    Patient aware to follow-up with MD and PMD as needed.    Signed by: Orbie Pyo

## 2018-04-12 ENCOUNTER — Telehealth (INDEPENDENT_AMBULATORY_CARE_PROVIDER_SITE_OTHER): Payer: Self-pay

## 2018-04-12 ENCOUNTER — Encounter (INDEPENDENT_AMBULATORY_CARE_PROVIDER_SITE_OTHER): Payer: Self-pay

## 2018-04-12 NOTE — Telephone Encounter (Signed)
Hospital D/C Template    Chart Review:     Type of Encounter (Inpatient or Obs): inpatient  Facility: [facility]  Discharge Date: 04/11/18 3days  Primary Discharge Dx: gout  Prior ER Visits/Hospitalizations (past year): 5  Follow Up Appt with PCP/Specialist: pcp rhumatology  Home Care/Home Health Ordered? yes  Have you been contacted to arrange care?    Call to patient as follow up from hospital Wing to assess current status, address questions/concerns regarding discharge instructions / medications and encourage patient to schedule hosp [place] fu appt with PCP. [place].

## 2018-04-13 ENCOUNTER — Encounter (INDEPENDENT_AMBULATORY_CARE_PROVIDER_SITE_OTHER): Payer: Self-pay

## 2018-04-21 ENCOUNTER — Encounter (INDEPENDENT_AMBULATORY_CARE_PROVIDER_SITE_OTHER): Payer: Self-pay | Admitting: Family Medicine

## 2018-04-21 ENCOUNTER — Telehealth (INDEPENDENT_AMBULATORY_CARE_PROVIDER_SITE_OTHER): Payer: Self-pay | Admitting: Family Medicine

## 2018-04-21 DIAGNOSIS — Z794 Long term (current) use of insulin: Secondary | ICD-10-CM

## 2018-04-21 NOTE — Telephone Encounter (Signed)
Need diabetes fu appt. Patient insulin dose was increased to hospital as on prednisone,. Need to see if need to reduce dose after prednisone is done. Recommend monitor blood sugar daily before meals and bedtime and call us with levels next week.     Also He has refills on the script. Advised check with pharmacy and call.

## 2018-04-21 NOTE — Telephone Encounter (Signed)
Please see message. Thanks!

## 2018-04-21 NOTE — Telephone Encounter (Signed)
Patient's daughter called regarding her father. She said that her father needs insulin glargine (LANTUS SOLOSTAR) 100 UNIT/ML injection pen and it has to be changed from 10 to 25.     The patient's daughter said that it has to be changed to 25 otherwise the insurance will not cover it. This message was sent with high importance. Please advise.      Pt's preferred pharmacy is     [facility] [id number] - [address], [place] [phone] (Phone)  [phone] (Fax)       Patient contact info: [phone]        Thank you!

## 2018-04-21 NOTE — Telephone Encounter (Signed)
Spoke with patient's daughter [name] who states patient is having trouble walking due to gout flare-up and he is not able to come now for a diabetes follow up. She states they will monitor his blood sugar and call next week with results. She is aware to check with pharmacy for lantus refills and will call us back if she still has difficulty getting it.

## 2018-04-21 NOTE — Telephone Encounter (Signed)
See note regarding change of medication.  Patient requesting refill due to an increased dosage.

## 2018-04-21 NOTE — Telephone Encounter (Signed)
I called and spoke with the daughter.  Because the patient is on prednisone.  His insulin was increased from 10 units to 25 units at the hospital.  Looks like he picked up a 9 mL of 100 units per mL insulin from the pharmacy on December 29.  He should have enough until the end of this month, the patient daughter is calling for sooner refill.  I called and spoke with her and she say she is concerned because half the bottle is empty, so she wants to make sure he has enough.  I explained to the daughter that he is on 25 units only because he is on prednisone, as the prednisone dose gets reduced.  He will need only less insulin.  Asked what his blood sugar was like, she is told me that they have not been checking.  Importance of checking blood sugar before each meal and before giving insulin at night  Discussed at length.  Hypoglycemia can induce coma, especially at his age has been discussed in detail.  Advised to call us back with his blood sugar reading.  Daughter voices stating that her son is very aware off it, will start monitoring his sugar closely and will call us back.  She has been advised to call me back for the refill next week based on his blood sugars will titrate insulin, patient also needs a follow-up for diabetes management immediately.

## 2018-04-21 NOTE — Telephone Encounter (Signed)
Patient's wife stated patient was recently discharged from hospital and advised to change medication insulin glargine (LANTUS SOLOSTAR) 100 UNIT/ML injection pen from 10 to 25. Wife stated patient is running low on insulin and requested nurse to call in new prescription.    Preferred pharmacy:  [facility] [id number] - [address], [place]  [phone] (Phone)  [phone] (Fax)

## 2018-04-23 NOTE — Progress Notes (Signed)
04/23/18 @-Received email from [facility] intake stating that agency has been unable to contact patient. Three atttempts to contact patient with no return phone call. Patient non admitted for [facility] services.        Jeani Hawking Alizon Schmeling, RN, Programmer, applications, [place]  [facility]  [phone]

## 2018-04-25 ENCOUNTER — Telehealth (INDEPENDENT_AMBULATORY_CARE_PROVIDER_SITE_OTHER): Payer: Self-pay | Admitting: Family Medicine

## 2018-04-25 NOTE — Telephone Encounter (Signed)
Please see message. Thanks!

## 2018-04-25 NOTE — Telephone Encounter (Signed)
Pharmacy requesting refill of BD Pen Needle Mini 31 G x 5 MM Misc, quantity 100

## 2018-04-25 NOTE — Telephone Encounter (Signed)
Pts son would like inform Dr.Anbarasan of pts blood sugar readings:    1/19- Before breakfast-164  Before Dinner- 150    1/20- Before breakfast- 220  Before dinner -168

## 2018-04-25 NOTE — Telephone Encounter (Addendum)
Getting voicemail.  Fasting blood sugar is high but at his age risky to have such high insulin dose.Continue current lantus insulin dose but recheck and call us in 2 days.

## 2018-04-26 ENCOUNTER — Emergency Department: Payer: Medicare Other

## 2018-04-26 ENCOUNTER — Other Ambulatory Visit: Payer: Medicare Other

## 2018-04-26 ENCOUNTER — Inpatient Hospital Stay
Admission: EM | Admit: 2018-04-26 | Discharge: 2018-04-28 | DRG: 603 | Disposition: A | Discharge status: Home Health | Payer: Medicare Other | Attending: Internal Medicine | Admitting: Internal Medicine

## 2018-04-26 DIAGNOSIS — M109 Gout, unspecified: Secondary | ICD-10-CM | POA: Diagnosis present

## 2018-04-26 DIAGNOSIS — I2510 Atherosclerotic heart disease of native coronary artery without angina pectoris: Secondary | ICD-10-CM | POA: Diagnosis present

## 2018-04-26 DIAGNOSIS — M00861 Arthritis due to other bacteria, right knee: Secondary | ICD-10-CM

## 2018-04-26 DIAGNOSIS — N189 Chronic kidney disease, unspecified: Secondary | ICD-10-CM

## 2018-04-26 DIAGNOSIS — Z66 Do not resuscitate: Secondary | ICD-10-CM | POA: Diagnosis present

## 2018-04-26 DIAGNOSIS — G894 Chronic pain syndrome: Secondary | ICD-10-CM | POA: Diagnosis present

## 2018-04-26 DIAGNOSIS — A419 Sepsis, unspecified organism: Secondary | ICD-10-CM

## 2018-04-26 DIAGNOSIS — G8929 Other chronic pain: Secondary | ICD-10-CM

## 2018-04-26 DIAGNOSIS — I5030 Unspecified diastolic (congestive) heart failure: Secondary | ICD-10-CM

## 2018-04-26 DIAGNOSIS — Z794 Long term (current) use of insulin: Secondary | ICD-10-CM

## 2018-04-26 DIAGNOSIS — E11649 Type 2 diabetes mellitus with hypoglycemia without coma: Secondary | ICD-10-CM | POA: Diagnosis present

## 2018-04-26 DIAGNOSIS — L03116 Cellulitis of left lower limb: Secondary | ICD-10-CM | POA: Diagnosis present

## 2018-04-26 DIAGNOSIS — E162 Hypoglycemia, unspecified: Secondary | ICD-10-CM

## 2018-04-26 DIAGNOSIS — Z950 Presence of cardiac pacemaker: Secondary | ICD-10-CM

## 2018-04-26 DIAGNOSIS — M353 Polymyalgia rheumatica: Secondary | ICD-10-CM | POA: Diagnosis present

## 2018-04-26 DIAGNOSIS — I130 Hypertensive heart and chronic kidney disease with heart failure and stage 1 through stage 4 chronic kidney disease, or unspecified chronic kidney disease: Secondary | ICD-10-CM | POA: Diagnosis present

## 2018-04-26 DIAGNOSIS — L03115 Cellulitis of right lower limb: Principal | ICD-10-CM | POA: Diagnosis present

## 2018-04-26 DIAGNOSIS — I150 Renovascular hypertension: Secondary | ICD-10-CM

## 2018-04-26 DIAGNOSIS — E1122 Type 2 diabetes mellitus with diabetic chronic kidney disease: Secondary | ICD-10-CM | POA: Diagnosis present

## 2018-04-26 DIAGNOSIS — Z79899 Other long term (current) drug therapy: Secondary | ICD-10-CM

## 2018-04-26 DIAGNOSIS — M10061 Idiopathic gout, right knee: Secondary | ICD-10-CM

## 2018-04-26 DIAGNOSIS — R509 Fever, unspecified: Secondary | ICD-10-CM

## 2018-04-26 DIAGNOSIS — L0390 Cellulitis, unspecified: Secondary | ICD-10-CM | POA: Diagnosis present

## 2018-04-26 DIAGNOSIS — E782 Mixed hyperlipidemia: Secondary | ICD-10-CM

## 2018-04-26 DIAGNOSIS — M00862 Arthritis due to other bacteria, left knee: Secondary | ICD-10-CM

## 2018-04-26 DIAGNOSIS — N184 Chronic kidney disease, stage 4 (severe): Secondary | ICD-10-CM | POA: Diagnosis present

## 2018-04-26 DIAGNOSIS — I5042 Chronic combined systolic (congestive) and diastolic (congestive) heart failure: Secondary | ICD-10-CM | POA: Diagnosis present

## 2018-04-26 DIAGNOSIS — M10062 Idiopathic gout, left knee: Secondary | ICD-10-CM

## 2018-04-26 DIAGNOSIS — M1A9XX0 Chronic gout, unspecified, without tophus (tophi): Secondary | ICD-10-CM | POA: Diagnosis present

## 2018-04-26 DIAGNOSIS — Z7982 Long term (current) use of aspirin: Secondary | ICD-10-CM

## 2018-04-26 DIAGNOSIS — I4891 Unspecified atrial fibrillation: Secondary | ICD-10-CM | POA: Diagnosis present

## 2018-04-26 DIAGNOSIS — I252 Old myocardial infarction: Secondary | ICD-10-CM

## 2018-04-26 LAB — GROUP A STREP, RAPID ANTIGEN: Group A Strep, Rapid Antigen: NEGATIVE

## 2018-04-26 LAB — BODY FLUID CELL COUNT
Body Fluid Lymphocytes: 1 %
Body Fluid Lymphocytes: 2 %
Body Fluid Monocyte/Macrophage Cells: 20 %
Body Fluid Monocyte/Macrophage Cells: 25 %
Body Fluid Polymorphonuclear Cell: 74 % — ABNORMAL HIGH (ref <25)
Body Fluid Polymorphonuclear Cell: 78 % — ABNORMAL HIGH (ref <25)
Body Fluid RBC: 6000 /mm3
Body Fluid RBC: 87000 /mm3
Body Fluid WBC: 5081 /mm3
Body Fluid WBC: 9450 /mm3

## 2018-04-26 LAB — COMPREHENSIVE METABOLIC PANEL
ALT: 9 U/L (ref 0–55)
AST (SGOT): 26 U/L (ref 5–34)
Albumin/Globulin Ratio: 1.6 (ref 0.9–2.2)
Albumin: 4.1 g/dL (ref 3.5–5.0)
Alkaline Phosphatase: 49 U/L (ref 38–106)
Anion Gap: 11 (ref 5.0–15.0)
BUN: 40.2 mg/dL — ABNORMAL HIGH (ref 9.0–28.0)
Bilirubin, Total: 0.9 mg/dL (ref 0.2–1.2)
CO2: 23 mEq/L (ref 22–29)
Calcium: 9.9 mg/dL (ref 7.9–10.2)
Chloride: 104 mEq/L (ref 100–111)
Creatinine: 2.3 mg/dL — ABNORMAL HIGH (ref 0.7–1.3)
Globulin: 2.6 g/dL (ref 2.0–3.6)
Glucose: 36 mg/dL — CL (ref 70–100)
Potassium: 4.7 mEq/L (ref 3.5–5.1)
Protein, Total: 6.7 g/dL (ref 6.0–8.3)
Sodium: 138 mEq/L (ref 136–145)

## 2018-04-26 LAB — LACTIC ACID, PLASMA: Lactic Acid: 1 mmol/L (ref 0.2–2.0)

## 2018-04-26 LAB — CBC AND DIFFERENTIAL
Absolute NRBC: 0 10*3/uL (ref 0.00–0.00)
Basophils Absolute Automated: 0.04 10*3/uL (ref 0.00–0.08)
Basophils Automated: 0.3 %
Eosinophils Absolute Automated: 0.01 10*3/uL (ref 0.00–0.44)
Eosinophils Automated: 0.1 %
Hematocrit: 38.3 % (ref 37.6–49.6)
Hgb: 12.5 g/dL (ref 12.5–17.1)
Immature Granulocytes Absolute: 0.07 10*3/uL (ref 0.00–0.07)
Immature Granulocytes: 0.6 %
Lymphocytes Absolute Automated: 1.59 10*3/uL (ref 0.42–3.22)
Lymphocytes Automated: 13.8 %
MCH: 29.8 pg (ref 25.1–33.5)
MCHC: 32.6 g/dL (ref 31.5–35.8)
MCV: 91.2 fL (ref 78.0–96.0)
MPV: 10 fL (ref 8.9–12.5)
Monocytes Absolute Automated: 1.36 10*3/uL — ABNORMAL HIGH (ref 0.21–0.85)
Monocytes: 11.8 %
Neutrophils Absolute: 8.49 10*3/uL — ABNORMAL HIGH (ref 1.10–6.33)
Neutrophils: 73.4 %
Nucleated RBC: 0 /100 WBC (ref 0.0–0.0)
Platelets: 199 10*3/uL (ref 142–346)
RBC: 4.2 10*6/uL (ref 4.20–5.90)
RDW: 17 % — ABNORMAL HIGH (ref 11–15)
WBC: 11.56 10*3/uL — ABNORMAL HIGH (ref 3.10–9.50)

## 2018-04-26 LAB — ECG 12-LEAD
Atrial Rate: 74 {beats}/min
P Axis: 62 degrees
P-R Interval: 172 ms
Q-T Interval: 572 ms
QRS Duration: 174 ms
QTC Calculation (Bezet): 634 ms
R Axis: -72 degrees
T Axis: 85 degrees
Ventricular Rate: 74 {beats}/min

## 2018-04-26 LAB — URINALYSIS REFLEX TO MICROSCOPIC EXAM - REFLEX TO CULTURE
Bilirubin, UA: NEGATIVE
Glucose, UA: NEGATIVE
Ketones UA: NEGATIVE
Leukocyte Esterase, UA: NEGATIVE
Nitrite, UA: NEGATIVE
Protein, UR: 100 — AB
Specific Gravity UA: 1.017 (ref 1.001–1.035)
Urine pH: 8 (ref 5.0–8.0)
Urobilinogen, UA: 0.2 mg/dL (ref 0.2–2.0)

## 2018-04-26 LAB — SEDIMENTATION RATE: Sed Rate: 48 mm/Hr — ABNORMAL HIGH (ref 0–15)

## 2018-04-26 LAB — PT AND APTT
PT INR: 1.1 (ref 0.9–1.1)
PT: 14.1 s (ref 12.6–15.0)
PTT: 43 s — ABNORMAL HIGH (ref 23–37)

## 2018-04-26 LAB — GLUCOSE WHOLE BLOOD - POCT
Whole Blood Glucose POCT: 137 mg/dL — ABNORMAL HIGH (ref 70–100)
Whole Blood Glucose POCT: 92 mg/dL (ref 70–100)
Whole Blood Glucose POCT: 120 mg/dL — ABNORMAL HIGH (ref 70–100)
Whole Blood Glucose POCT: 157 mg/dL — ABNORMAL HIGH (ref 70–100)
Whole Blood Glucose POCT: 231 mg/dL — ABNORMAL HIGH (ref 70–100)
Whole Blood Glucose POCT: 288 mg/dL — ABNORMAL HIGH (ref 70–100)

## 2018-04-26 LAB — PROTEIN, BODY FLUID
Body Fluid Protein: 4 g/dL
Body Fluid Protein: 3.8 g/dL

## 2018-04-26 LAB — MAGNESIUM: Magnesium: 1.8 mg/dL (ref 1.6–2.6)

## 2018-04-26 LAB — BODY FLUID CRYSTAL

## 2018-04-26 LAB — GLUCOSE, BODY FLUID
Body Fluid Glucose: 82 mg/dL
Body Fluid Glucose: 80 mg/dL

## 2018-04-26 LAB — GFR: EGFR: 26.9

## 2018-04-26 LAB — C-REACTIVE PROTEIN: C-Reactive Protein: 19.1 mg/dL — ABNORMAL HIGH (ref 0.0–0.8)

## 2018-04-26 MED ORDER — ALLOPURINOL 100 MG PO TABS
100.0000 mg | ORAL_TABLET | Freq: Every day | ORAL | Status: DC
Start: 2018-04-26 — End: 2018-04-28
  Administered 2018-04-26 – 2018-04-28 (×3): 100 mg via ORAL
  Filled 2018-04-26 (×4): qty 1

## 2018-04-26 MED ORDER — DEXTROSE 50 % IV SOLN
12.50 g | INTRAVENOUS | Status: DC | PRN
Start: 2018-04-26 — End: 2018-04-28

## 2018-04-26 MED ORDER — METHYLPREDNISOLONE SODIUM SUCC 40 MG IJ SOLR
40.00 mg | Freq: Four times a day (QID) | INTRAMUSCULAR | Status: DC
Start: 2018-04-26 — End: 2018-04-27
  Administered 2018-04-26 – 2018-04-27 (×5): 40 mg via INTRAVENOUS
  Filled 2018-04-26 (×5): qty 1

## 2018-04-26 MED ORDER — ACETAMINOPHEN 325 MG PO TABS
650.0000 mg | ORAL_TABLET | Freq: Four times a day (QID) | ORAL | Status: DC
Start: 2018-04-26 — End: 2018-04-28
  Administered 2018-04-26 – 2018-04-28 (×7): 650 mg via ORAL
  Filled 2018-04-26 (×9): qty 2

## 2018-04-26 MED ORDER — SODIUM CHLORIDE 0.9 % IV BOLUS
500.00 mL | Freq: Once | INTRAVENOUS | Status: AC
Start: 2018-04-26 — End: 2018-04-26
  Administered 2018-04-26: 07:00:00 500 mL via INTRAVENOUS

## 2018-04-26 MED ORDER — HEPARIN SODIUM (PORCINE) 5000 UNIT/ML IJ SOLN
5000.00 [IU] | Freq: Two times a day (BID) | INTRAMUSCULAR | Status: DC
Start: 2018-04-26 — End: 2018-04-28
  Administered 2018-04-26 – 2018-04-28 (×4): 5000 [IU] via SUBCUTANEOUS
  Filled 2018-04-26 (×4): qty 1

## 2018-04-26 MED ORDER — INSULIN GLARGINE 100 UNIT/ML SC SOLN
15.00 [IU] | Freq: Every evening | SUBCUTANEOUS | Status: DC
Start: 2018-04-26 — End: 2018-04-28
  Administered 2018-04-26 – 2018-04-27 (×2): 15 [IU] via SUBCUTANEOUS
  Filled 2018-04-26 (×2): qty 15

## 2018-04-26 MED ORDER — SODIUM CHLORIDE 0.9 % IV SOLN
1500.00 mg | Freq: Once | INTRAVENOUS | Status: AC
Start: 2018-04-26 — End: 2018-04-26
  Administered 2018-04-26: 12:00:00 1500 mg via INTRAVENOUS
  Filled 2018-04-26: qty 1500

## 2018-04-26 MED ORDER — ALBUTEROL-IPRATROPIUM 2.5-0.5 (3) MG/3ML IN SOLN
3.00 mL | Freq: Four times a day (QID) | RESPIRATORY_TRACT | Status: DC | PRN
Start: 2018-04-26 — End: 2018-04-28

## 2018-04-26 MED ORDER — FUROSEMIDE 40 MG PO TABS
40.0000 mg | ORAL_TABLET | Freq: Every day | ORAL | Status: DC
Start: 2018-04-26 — End: 2018-04-28
  Administered 2018-04-26 – 2018-04-28 (×3): 40 mg via ORAL
  Filled 2018-04-26 (×3): qty 1

## 2018-04-26 MED ORDER — ATORVASTATIN CALCIUM 20 MG PO TABS
40.0000 mg | ORAL_TABLET | Freq: Every evening | ORAL | Status: DC
Start: 2018-04-26 — End: 2018-04-28
  Administered 2018-04-26 – 2018-04-27 (×2): 40 mg via ORAL
  Filled 2018-04-26 (×2): qty 2

## 2018-04-26 MED ORDER — MORPHINE SULFATE 2 MG/ML IJ/IV SOLN (WRAP)
2.0000 mg | Freq: Once | Status: AC
Start: 2018-04-26 — End: 2018-04-26
  Administered 2018-04-26: 08:00:00 2 mg via INTRAVENOUS
  Filled 2018-04-26: qty 1

## 2018-04-26 MED ORDER — GABAPENTIN 100 MG PO CAPS
100.00 mg | ORAL_CAPSULE | Freq: Three times a day (TID) | ORAL | Status: DC
Start: 2018-04-26 — End: 2018-04-28
  Administered 2018-04-26 – 2018-04-28 (×6): 100 mg via ORAL
  Filled 2018-04-26 (×6): qty 1

## 2018-04-26 MED ORDER — GLUCOSE 40 % PO GEL
15.00 g | ORAL | Status: DC | PRN
Start: 2018-04-26 — End: 2018-04-28

## 2018-04-26 MED ORDER — LIDOCAINE-EPINEPHRINE 1 %-1:100000 IJ SOLN
1.00 mL | Freq: Once | INTRAMUSCULAR | Status: DC
Start: 2018-04-26 — End: 2018-04-26
  Filled 2018-04-26: qty 20

## 2018-04-26 MED ORDER — METOPROLOL SUCCINATE ER 25 MG PO TB24
12.50 mg | ORAL_TABLET | Freq: Every day | ORAL | Status: DC
Start: 2018-04-26 — End: 2018-04-28
  Administered 2018-04-26 – 2018-04-28 (×3): 12.5 mg via ORAL
  Filled 2018-04-26 (×4): qty 1

## 2018-04-26 MED ORDER — ONDANSETRON HCL 4 MG/2ML IJ SOLN
4.00 mg | Freq: Once | INTRAMUSCULAR | Status: AC
Start: 2018-04-26 — End: 2018-04-26
  Administered 2018-04-26: 08:00:00 4 mg via INTRAVENOUS
  Filled 2018-04-26: qty 2

## 2018-04-26 MED ORDER — SODIUM BICARBONATE 650 MG PO TABS
1300.0000 mg | ORAL_TABLET | Freq: Two times a day (BID) | ORAL | Status: DC
Start: 2018-04-26 — End: 2018-04-28
  Administered 2018-04-27 – 2018-04-28 (×3): 1300 mg via ORAL
  Filled 2018-04-26 (×8): qty 2

## 2018-04-26 MED ORDER — TRAMADOL HCL 50 MG PO TABS
50.0000 mg | ORAL_TABLET | Freq: Two times a day (BID) | ORAL | Status: DC | PRN
Start: 2018-04-26 — End: 2018-04-28
  Administered 2018-04-27 – 2018-04-28 (×2): 50 mg via ORAL
  Filled 2018-04-26 (×2): qty 1

## 2018-04-26 MED ORDER — METHYLPREDNISOLONE SODIUM SUCC 40 MG IJ SOLR
40.00 mg | Freq: Once | INTRAMUSCULAR | Status: AC
Start: 2018-04-26 — End: 2018-04-26
  Administered 2018-04-26: 12:00:00 40 mg via INTRAVENOUS
  Filled 2018-04-26: qty 1

## 2018-04-26 MED ORDER — INSULIN LISPRO 100 UNIT/ML SC SOLN
1.00 [IU] | Freq: Three times a day (TID) | SUBCUTANEOUS | Status: DC
Start: 2018-04-26 — End: 2018-04-28
  Administered 2018-04-27 (×2): 5 [IU] via SUBCUTANEOUS
  Administered 2018-04-27: 17:00:00 3 [IU] via SUBCUTANEOUS
  Administered 2018-04-28: 11:00:00 7 [IU] via SUBCUTANEOUS
  Administered 2018-04-28: 08:00:00 3 [IU] via SUBCUTANEOUS
  Filled 2018-04-26: qty 15
  Filled 2018-04-26: qty 9
  Filled 2018-04-26: qty 6
  Filled 2018-04-26: qty 9
  Filled 2018-04-26: qty 15

## 2018-04-26 MED ORDER — SODIUM CHLORIDE 0.9 % IV SOLN
INTRAVENOUS | Status: AC
Start: 2018-04-26 — End: 2018-04-27

## 2018-04-26 MED ORDER — CEFTRIAXONE SODIUM 1 G IJ SOLR
1.00 g | INTRAMUSCULAR | Status: DC
Start: 2018-04-27 — End: 2018-04-28
  Administered 2018-04-27 – 2018-04-28 (×2): 1 g via INTRAVENOUS
  Filled 2018-04-26 (×2): qty 1000

## 2018-04-26 MED ORDER — ACETAMINOPHEN 500 MG PO TABS
1000.0000 mg | ORAL_TABLET | Freq: Once | ORAL | Status: AC
Start: 2018-04-26 — End: 2018-04-26
  Administered 2018-04-26: 08:00:00 1000 mg via ORAL
  Filled 2018-04-26: qty 2

## 2018-04-26 MED ORDER — DEXTROSE 50 % IV SOLN
25.00 mL | Freq: Once | INTRAVENOUS | Status: AC
Start: 2018-04-26 — End: 2018-04-26
  Administered 2018-04-26: 07:00:00 25 mL via INTRAVENOUS
  Filled 2018-04-26: qty 50

## 2018-04-26 MED ORDER — NALOXONE HCL 0.4 MG/ML IJ SOLN (WRAP)
0.20 mg | INTRAMUSCULAR | Status: DC | PRN
Start: 2018-04-26 — End: 2018-04-28

## 2018-04-26 MED ORDER — ASPIRIN 81 MG PO CHEW
81.00 mg | CHEWABLE_TABLET | Freq: Every day | ORAL | Status: DC
Start: 2018-04-26 — End: 2018-04-28
  Administered 2018-04-26 – 2018-04-28 (×3): 81 mg via ORAL
  Filled 2018-04-26 (×3): qty 1

## 2018-04-26 MED ORDER — PANTOPRAZOLE SODIUM 40 MG PO TBEC
40.00 mg | DELAYED_RELEASE_TABLET | Freq: Every morning | ORAL | Status: DC
Start: 2018-04-26 — End: 2018-04-28
  Administered 2018-04-27 – 2018-04-28 (×2): 40 mg via ORAL
  Filled 2018-04-26 (×2): qty 1

## 2018-04-26 MED ORDER — SODIUM CHLORIDE 0.9 % IV SOLN
INTRAVENOUS | Status: DC
Start: 2018-04-26 — End: 2018-04-27

## 2018-04-26 MED ORDER — GLUCAGON 1 MG IJ SOLR (WRAP)
1.00 mg | INTRAMUSCULAR | Status: DC | PRN
Start: 2018-04-26 — End: 2018-04-28

## 2018-04-26 MED ORDER — RISAQUAD PO CAPS
1.00 | ORAL_CAPSULE | Freq: Every day | ORAL | Status: DC
Start: 2018-04-26 — End: 2018-04-28
  Administered 2018-04-26 – 2018-04-28 (×3): 1 via ORAL
  Filled 2018-04-26 (×3): qty 1

## 2018-04-26 MED ORDER — SODIUM CHLORIDE 0.9 % IV MBP
1.00 g | Freq: Once | INTRAVENOUS | Status: AC
Start: 2018-04-26 — End: 2018-04-26
  Administered 2018-04-26: 10:00:00 1 g via INTRAVENOUS
  Filled 2018-04-26: qty 1000

## 2018-04-26 MED ORDER — LIDOCAINE-EPINEPHRINE 1 %-1:100000 IJ SOLN
10.00 mL | Freq: Once | INTRAMUSCULAR | Status: AC
Start: 2018-04-26 — End: 2018-04-26
  Administered 2018-04-26: 10:00:00 10 mL via INTRADERMAL

## 2018-04-26 MED ORDER — ACETAMINOPHEN 325 MG PO TABS
650.0000 mg | ORAL_TABLET | ORAL | Status: DC | PRN
Start: 2018-04-26 — End: 2018-04-28

## 2018-04-26 MED ORDER — MORPHINE SULFATE 2 MG/ML IJ/IV SOLN (WRAP)
2.0000 mg | Freq: Once | Status: AC
Start: 2018-04-26 — End: 2018-04-26
  Administered 2018-04-26: 13:00:00 2 mg via INTRAVENOUS
  Filled 2018-04-26: qty 1

## 2018-04-26 NOTE — ED Notes (Signed)
Bed: 13  Expected date:   Expected time:   Means of arrival:   Comments:  Terminal

## 2018-04-26 NOTE — H&P (Addendum)
[facility] H&P Note    Patient Info:   Date/Time: 04/26/2018 / 12:45 PM   Admit Date:04/26/2018  Patient Name:Marco Bruce   SWN:[id number]   PCP: Danella Sensing, MD  Attending Physician:Virmani, Diamantina Providence, MD  Assessment:Plan   Acute on chronic gout with septic arthritis  -S/p bedside arthrocentesis of both knees-we will follow-up on culture results  -Continue IV Rocephin  -Continue IV steroids -will taper as tolerated  - cont allopurinol home dose of allopurinol 100mg /d  -will need increase dose of allopurinol or to switch to uloricbut not during acute flareas can worsen flare-will need to be done outpt  -low purine diet  -pt to f/up with outpt rheumatology for further management    History of PMR    Insulin dependent diabetes type 2  -Continue Lantus and sliding scale insulin  -Hold linagliptin while in the hospital    Stage IV chronic kidney disease   -Creatinine at baseline  -Hold nephrotoxins    Renovascular hypertension  -Continue Toprol-XL  -Blood pressure stable    Mixed hyperlipidemia  -Continue Lipitor    Chronic systolic congestive heart failure ejection fraction of 35 to 40% per echo on 01/2018  -Continue Lasix, statins, aspirin and beta-blockers    History of heart block status post pacemaker placement in 2017    Grandson helped with translation.  Patient speaks Khmer only.     DVT Prohylaxis:heparin   Central Line/Foley Catheter/PICC line status: none   Code Status: NO CPR  -  ALLOW NATURAL DEATH  Disposition:home  Type of Admission:Observation  Estimated Length of Stay (including stay in the ER receiving treatment): 1-2 days   Milestones required for discharge: Clinical improvement  Clinical Information and History:   Chief Complaint:  Chief Complaint   Patient presents with    Knee Pain    Fever     Past Medical History:  Past Medical History:   Diagnosis Date    Acute diastolic heart failure 09/38/1829    Acute on chronic diastolic congestive heart failure 08/13/2016     Acute pulmonary edema 05/26/2015    Chronic diastolic heart failure 93/71/6967    Chronic gout     Chronic pain syndrome 07/07/2015    CKD (chronic kidney disease) stage 4, GFR 15-29 ml/min     2018 baseline creatinine in EPIC is 1.5-2.3.    Congestive heart failure (CHF)     EF 40% 5/18 echo, on lasix    Current chronic use of systemic steroids     For Polymyalgia rheumatica    Essential hypertension     Mixed hyperlipidemia     NSTEMI (non-ST elevated myocardial infarction) 08/13/2016    Osteoarthritis of both knees     Peripheral edema 06/16/2015    Pneumonia due to infectious organism, unspecified laterality, unspecified part of lung 06/25/2015    Polymyalgia rheumatica     chronic steroids    Renovascular hypertension 08/04/2015    Second degree heart block by electrocardiogram (ECG) 04/27/2015    Type 2 diabetes mellitus with complication, with long-term current use of insulin     12/01/2016 Hemoglobin A1c 8.2%     Past Surgical History:  Past Surgical History:   Procedure Laterality Date    ABDOMINAL SURGERY      CHOLECYSTECTOMY      PACEMAKER Left 2017     Family History:  Family History   Problem Relation Age of Onset    Hypertension Father      Social History:  Social History     Substance and Sexual Activity   Alcohol Use No    Comment: former alcoholic     Social History     Substance and Sexual Activity   Drug Use No     Social History     Tobacco Use   Smoking Status Never Smoker   Smokeless Tobacco Never Used     Social History     Socioeconomic History    Marital status: Widowed     Spouse name: None    Number of children: None    Years of education: None    Highest education level: None   Occupational History    None   Social Transport planner strain: None    Food insecurity:     Worry: None     Inability: None    Transportation needs:     Medical: None     Non-medical: None   Tobacco Use    Smoking status: Never Smoker    Smokeless tobacco: Never Used   Substance  and Sexual Activity    Alcohol use: No     Comment: former alcoholic    Drug use: No    Sexual activity: None   Lifestyle    Physical activity:     Days per week: None     Minutes per session: None    Stress: None   Relationships    Social connections:     Talks on phone: None     Gets together: None     Attends religious service: None     Active member of club or organization: None     Attends meetings of clubs or organizations: None     Relationship status: None    Intimate partner violence:     Fear of current or ex partner: None     Emotionally abused: None     Physically abused: None     Forced sexual activity: None   Other Topics Concern    None   Social History Narrative    None     Allergies:No Known Allergies  Medications:(Not in a hospital admission)    Clinical Presentation: HPI:   Marco Bruce is a 85 y.o. male who has history of   Past Surgical History:   Procedure Laterality Date    ABDOMINAL SURGERY      CHOLECYSTECTOMY      PACEMAKER Left 2017      Past Medical History:   Diagnosis Date    Acute diastolic heart failure 19/14/7829    Acute on chronic diastolic congestive heart failure 08/13/2016    Acute pulmonary edema 05/26/2015    Chronic diastolic heart failure 56/21/3086    Chronic gout     Chronic pain syndrome 07/07/2015    CKD (chronic kidney disease) stage 4, GFR 15-29 ml/min     2018 baseline creatinine in EPIC is 1.5-2.3.    Congestive heart failure (CHF)     EF 40% 5/18 echo, on lasix    Current chronic use of systemic steroids     For Polymyalgia rheumatica    Essential hypertension     Mixed hyperlipidemia     NSTEMI (non-ST elevated myocardial infarction) 08/13/2016    Osteoarthritis of both knees     Peripheral edema 06/16/2015    Pneumonia due to infectious organism, unspecified laterality, unspecified part of lung 06/25/2015    Polymyalgia rheumatica     chronic steroids    Renovascular  hypertension 08/04/2015    Second degree heart block by electrocardiogram  (ECG) 04/27/2015    Type 2 diabetes mellitus with complication, with long-term current use of insulin     12/01/2016 Hemoglobin A1c 8.2%    came with the chief complaint of Knee Pain and Fever    85 year old male with multiple medical problems as mentioned above presents with bilateral knee pain.  Patient was admitted for the similar complaint at [facility] for acute gout and discharged on 04/11/2018 with tapering doses of prednisone.  Grandson informs that his pain was better at the time of discharge but started having the pain again 4 days ago and had been progressively getting worse.  Grandson says the pain is equally severe in both knees and patient says it is 10/10 in severity.  Patient had fever 101 yesterday and had 7 episodes of vomiting.  Patient was able to stand before but not able to bear weight due to pain for past couple days.    Procedures:   Date/Procedure: Arthrocentesis    Review of Systems:   Chief Complaint:  Knee Pain and Fever    Review of Systems   Constitutional: Positive for fever and malaise/fatigue.   HENT: Negative for hearing loss and tinnitus.    Respiratory: Negative for cough and shortness of breath.    Cardiovascular: Negative for chest pain and palpitations.   Gastrointestinal: Positive for nausea and vomiting. Negative for abdominal pain and heartburn.   Genitourinary: Negative for dysuria, frequency and urgency.   Musculoskeletal: Positive for joint pain and myalgias.   Skin: Negative for itching and rash.   Neurological: Negative for dizziness and headaches.   Endo/Heme/Allergies: Negative for environmental allergies. Does not bruise/bleed easily.   Psychiatric/Behavioral: Negative for depression and suicidal ideas.     Physical Exam:     Vitals:    04/26/18 1000 04/26/18 1138 04/26/18 1140 04/26/18 1200   BP: 134/49 116/43 116/43 119/47   Pulse: 72 72 69 73   Resp: 19 (!) 24 (!) 23 22   Temp:   99.2 F (37.3 C)    TempSrc:   Oral    SpO2:   94% 92%   Weight:       Height:          Physical Exam:   Physical Exam  Vitals signs and nursing note reviewed.   Constitutional:       Comments: Sleepy but easily arousable   HENT:      Head: Normocephalic and atraumatic.   Cardiovascular:      Rate and Rhythm: Normal rate and regular rhythm.   Pulmonary:      Effort: Pulmonary effort is normal.      Breath sounds: Normal breath sounds.   Abdominal:      General: Bowel sounds are normal.      Palpations: Abdomen is soft.   Musculoskeletal:         General: Swelling and tenderness present.   Skin:     General: Skin is warm.      Comments: Bilateral knee edema erythema and tenderness   Neurological:      General: No focal deficit present.      Comments: Oriented to place and person       Results of Labs/imaging:   Labs have been reviewed:   Coagulation Profile:   Recent Labs   Lab 04/26/18  0629   PT 14.1   PT INR 1.1   PTT 43*  CBC review:   Recent Labs   Lab 04/26/18  0629   WBC 11.56*   Hgb 12.5   Hematocrit 38.3   Platelets 199   MCV 91.2   RDW 17*   Neutrophils 73.4   Lymphocytes Automated 13.8   Eosinophils Automated 0.1   Immature Granulocyte 0.6   Neutrophils Absolute 8.49*   Absolute Immature Granulocyte 0.07     Chem Review:  Recent Labs   Lab 04/26/18  0629   Sodium 138   Potassium 4.7   Chloride 104   CO2 23   BUN 40.2*   Creatinine 2.3*   Glucose 36*   Calcium 9.9   Magnesium 1.8   Bilirubin, Total 0.9   AST (SGOT) 26   ALT 9   Alkaline Phosphatase 49     Results     Procedure Component Value Units Date/Time    Glucose, Body Fluid [[id number]] Collected:  04/26/18 0942     Updated:  04/26/18 1217     Body Fluid Source: L KNEE     Body Fluid Glucose 82 mg/dL     Protein, Body Fluid [[id number]] Collected:  04/26/18 0942     Updated:  04/26/18 1217     Body Fluid Protein 4.0 g/dL     Glucose - Body Fluid [[id number]] Collected:  04/26/18 0946    Specimen:  Joint Fluid from Synovial Fluid Updated:  04/26/18 1115     Body Fluid Source: Synovial Fluid    Cell Count - Body Fluid [[id number]]   (Abnormal) Collected:  04/26/18 0945    Specimen:  Joint Fluid from Body Fluid Updated:  04/26/18 1115     Body Fluid Source: Synovial Fluid     Body Fluid WBC 9,450 /cumm      Body Fluid RBC 6,000 /cumm      Body Fluid Polymorphonuclear Cell 74 %      Body Fluid Lymphocytes 1 %      Body Fluid Monocyte/Macrophage Cells 25 %     BODY FLUID CRYSTAL [[id number]] Collected:  04/26/18 0942     Updated:  04/26/18 1113     Body Fluid Source: synovial     Crystals UA see below    Crystals - Body Fluid [[id number]] Collected:  04/26/18 0945    Specimen:  Joint Fluid from Synovial Fluid Updated:  04/26/18 1111     Body Fluid Source: Synovial     Crystals UA see below    Cell Count - Body Fluid [[id number]]  (Abnormal) Collected:  04/26/18 0945    Specimen:  Joint Fluid from Body Fluid Updated:  04/26/18 1105     Body Fluid Source: # SYN     Body Fluid WBC 5,081 /cumm      Body Fluid RBC 87,000 /cumm      Body Fluid Polymorphonuclear Cell 78 %      Body Fluid Lymphocytes 2 %      Body Fluid Monocyte/Macrophage Cells 20 %     Preliminary Gram Stain [[id number]] Collected:  04/26/18 9198    Specimen:  Synovial Fluid Updated:  04/26/18 1103    Narrative:       ORDER#: [id number]                                    ORDERED BY: Cleotis Nipper  SOURCE: Synovial Fluid RIGHT KNEE  COLLECTED:  04/26/18 09:42  ANTIBIOTICS AT COLL.:                                RECEIVED :  04/26/18 10:34  Preliminary Gram Stain                     FINAL       04/26/18 11:03  04/26/18   Many WBCs             No organisms seen             Stain performed on Cytospin (concentrated) specimen             For final Gram Stain report, see order # [id number]             This is a preliminary result. The gram stain will be reviewed             by the Microbiology Department at the [facility].             See final result under Stain, Gram.      Preliminary Gram Stain [[id number]] Collected:  04/26/18 0942    Specimen:  Synovial Fluid  Updated:  04/26/18 1100    Narrative:       ORDER#: [id number]                                    ORDERED BY: Cleotis Nipper  SOURCE: Synovial Fluid LEFT KNEE                     COLLECTED:  04/26/18 09:42  ANTIBIOTICS AT COLL.:                                RECEIVED :  04/26/18 10:33  Preliminary Gram Stain                     FINAL       04/26/18 11:00  04/26/18   Many WBCs             No organisms seen             Stain performed on Cytospin (concentrated) specimen             For final Gram Stain report, see order #[id number]             This is a preliminary result. The gram stain will be reviewed             by the Microbiology Department at the [facility].             See final result under Stain, Gram.      Blood Culture Aerobic/Anaerobic #1 [[id number]] Collected:  04/26/18 0740    Specimen:  Arm from Blood, Venipuncture Updated:  04/26/18 1022    Narrative:       1 BLUE+1 PURPLE    Blood Culture Aerobic/Anaerobic #2 [[id number]] Collected:  04/26/18 0740    Specimen:  Arm from Blood, Venipuncture Updated:  04/26/18 1022    Narrative:       1 BLUE+1 PURPLE    Protein, Body Fluid [[id number]] Resulted:  04/26/18 1008     Updated:  04/26/18 1009     Body Fluid  Source: R KNEE    Culture and Gram Stain - Body Fluid [[id number]] Collected:  04/26/18 0942    Specimen:  Joint Fluid from Synovial Fluid Updated:  04/26/18 0946    Protein - Body Fluid [[id number]] Collected:  04/26/18 0946    Specimen:  Joint Fluid from Synovial Fluid Updated:  04/26/18 0946    Narrative:       Attention:Body Fluid    Culture and Gram Stain - Body Fluid [[id number]] Collected:  04/26/18 0942    Specimen:  Joint Fluid from Synovial Fluid Updated:  04/26/18 0946    Glucose Whole Blood - POCT [[id number]] Collected:  04/26/18 0838     Updated:  04/26/18 0841     POCT - Glucose Whole blood 92 mg/dL     Glucose Whole Blood - POCT [[id number]]  (Abnormal) Collected:  04/26/18 0739     Updated:  04/26/18 0742     POCT - Glucose Whole  blood 137 mg/dL     C Reactive Protein [[id number]]  (Abnormal) Collected:  04/26/18 0629    Specimen:  Blood Updated:  04/26/18 0715     C-Reactive Protein 19.1 mg/dL     Narrative:       Replace urinary catheter prior to obtaining the urine culture  if it has been in place for greater than or equal to 14  days:->N/A No Foley  Indications for U/A Reflex to Micro - Reflex to  Culture:->Suprapubic Pain/Tenderness or Dysuria    Magnesium [[id number]] Collected:  04/26/18 7078    Specimen:  Blood Updated:  04/26/18 0715     Magnesium 1.8 mg/dL     Narrative:       Replace urinary catheter prior to obtaining the urine culture  if it has been in place for greater than or equal to 14  days:->N/A No Foley  Indications for U/A Reflex to Micro - Reflex to  Culture:->Suprapubic Pain/Tenderness or Dysuria    GFR [[id number]] Collected:  04/26/18 0629     Updated:  04/26/18 0715     EGFR 26.9    Narrative:       Replace urinary catheter prior to obtaining the urine culture  if it has been in place for greater than or equal to 14  days:->N/A No Foley  Indications for U/A Reflex to Micro - Reflex to  Culture:->Suprapubic Pain/Tenderness or Dysuria    Comprehensive metabolic panel [[id number]]  (Abnormal) Collected:  04/26/18 0629    Specimen:  Blood Updated:  04/26/18 0715     Glucose 36 mg/dL      BUN 40.2 mg/dL      Creatinine 2.3 mg/dL      Sodium 138 mEq/L      Potassium 4.7 mEq/L      Chloride 104 mEq/L      CO2 23 mEq/L      Calcium 9.9 mg/dL      Protein, Total 6.7 g/dL      Albumin 4.1 g/dL      AST (SGOT) 26 U/L      ALT 9 U/L      Alkaline Phosphatase 49 U/L      Bilirubin, Total 0.9 mg/dL      Globulin 2.6 g/dL      Albumin/Globulin Ratio 1.6     Anion Gap 11.0    Narrative:       Replace urinary catheter prior to obtaining the urine culture  if it has been in place for greater than or equal to 14  days:->N/A No Foley  Indications for U/A Reflex to Micro - Reflex to  Culture:->Suprapubic Pain/Tenderness or Dysuria    UA  Reflex to Micro - Reflex to Culture [[id number]]  (Abnormal) Collected:  04/26/18 0629     Updated:  04/26/18 0708     Urine Type Urine, Clean Ca     Color, UA YELLOW     Clarity, UA CLEAR     Specific Gravity UA 1.017     Urine pH 8.0     Leukocyte Esterase, UA NEGATIVE     Nitrite, UA NEGATIVE     Protein, UR 100     Glucose, UA NEGATIVE     Ketones UA NEGATIVE     Urobilinogen, UA 0.2 mg/dL      Bilirubin, UA NEGATIVE     Blood, UA SMALL     RBC, UA 3 - 5 /hpf      WBC, UA 0 - 5 /hpf      Squamous Epithelial Cells, Urine 0 - 5 /hpf     Narrative:       Replace urinary catheter prior to obtaining the urine culture  if it has been in place for greater than or equal to 14  days:->N/A No Foley  Indications for U/A Reflex to Micro - Reflex to  Culture:->Suprapubic Pain/Tenderness or Dysuria    Sedimentation rate (ESR) [[id number]]  (Abnormal) Collected:  04/26/18 0629    Specimen:  Blood Updated:  04/26/18 0659     Sed Rate 48 mm/Hr     Narrative:       Replace urinary catheter prior to obtaining the urine culture  if it has been in place for greater than or equal to 14  days:->N/A No Foley  Indications for U/A Reflex to Micro - Reflex to  Culture:->Suprapubic Pain/Tenderness or Dysuria    PT/APTT [[id number]]  (Abnormal) Collected:  04/26/18 0629     Updated:  04/26/18 0654     PT 14.1 sec      PT INR 1.1     PTT 43 sec     Narrative:       Replace urinary catheter prior to obtaining the urine culture  if it has been in place for greater than or equal to 14  days:->N/A No Foley  Indications for U/A Reflex to Micro - Reflex to  Culture:->Suprapubic Pain/Tenderness or Dysuria    Lactic acid, plasma [[id number]] Collected:  04/26/18 0629    Specimen:  Blood Updated:  04/26/18 0653     Lactic acid 1.0 mmol/L     Rapid influenza A/B antigens [[id number]] Collected:  04/26/18 6144    Specimen:  Nasopharyngeal from Nasal Aspirate Updated:  04/26/18 0650    Narrative:       ORDER#: [id number]                                     ORDERED BY: Cleotis Nipper  SOURCE: Nasal Aspirate                               COLLECTED:  04/26/18 06:29  ANTIBIOTICS AT COLL.:                                RECEIVED :  04/26/18 06:36  Influenza Rapid Antigen A&B  FINAL       04/26/18 06:50  04/26/18   Negative for Influenza A and B             Reference Range: Negative      Rapid Strep (Group A Antigen) [[id number]] Collected:  04/26/18 0629    Specimen:  Throat Updated:  04/26/18 0645     Group A Strep, Rapid Antigen Negative    CBC and differential [[id number]]  (Abnormal) Collected:  04/26/18 0629    Specimen:  Blood Updated:  04/26/18 0642     WBC 11.56 x10 3/uL      Hgb 12.5 g/dL      Hematocrit 38.3 %      Platelets 199 x10 3/uL      RBC 4.20 x10 6/uL      MCV 91.2 fL      MCH 29.8 pg      MCHC 32.6 g/dL      RDW 17 %      MPV 10.0 fL      Neutrophils 73.4 %      Lymphocytes Automated 13.8 %      Monocytes 11.8 %      Eosinophils Automated 0.1 %      Basophils Automated 0.3 %      Immature Granulocyte 0.6 %      Nucleated RBC 0.0 /100 WBC      Neutrophils Absolute 8.49 x10 3/uL      Abs Lymph Automated 1.59 x10 3/uL      Abs Mono Automated 1.36 x10 3/uL      Abs Eos Automated 0.01 x10 3/uL      Absolute Baso Automated 0.04 x10 3/uL      Absolute Immature Granulocyte 0.07 x10 3/uL      Absolute NRBC 0.00 x10 3/uL     Narrative:       Replace urinary catheter prior to obtaining the urine culture  if it has been in place for greater than or equal to 14  days:->N/A No Foley  Indications for U/A Reflex to Micro - Reflex to  Culture:->Suprapubic Pain/Tenderness or Dysuria        Radiology reports have been reviewed:  Radiology Results (24 Hour)     Procedure Component Value Units Date/Time    XR Knee 1 Or 2 Views Right [[id number]] Collected:  04/26/18 0848    Order Status:  Completed Updated:  04/26/18 0853    Narrative:       History: Pain.    FINDINGS: AP and lateral views were obtained and are compared to  04/09/2018.    There is a moderate  right knee joint effusion which has increased since  the last exam. There is no evidence of a fracture or bony erosion. There  is moderate degenerative arthritis with joint space narrowing and  osteophyte formation. Vascular calcifications are present.      Impression:         1. Moderate right knee joint effusion increasing since 04/09/2018.  2. Moderate degenerative arthritis in the right knee.    Marjean Donna, MD   04/26/2018 8:49 AM    Abdomen 2 View With Chest 1 View [[id number]] Collected:  04/26/18 0844    Order Status:  Completed Updated:  04/26/18 1027    Narrative:       AP upright view of the chest   Upright and supine views of the abdomen    CLINICAL INDICATION: Vomiting. Comparison 04/08/2018.    FINDINGS: Pacemaker  leads overlie the right ventricle and right atrium.  The heart is enlarged. Aorta is uncoiled.. Lungs are clear with normal  pulmonary vascularity. No pleural effusion, hilar or mediastinal  prominence is evident.    No free intraperitoneal air is seen. Colon is mildly distended with air.  No air-fluid levels are seen. No dilated small bowel loops are seen.  Patient is status post cholecystectomy. Atherosclerotic plaque in the  abdominal aorta and bilateral common iliac arteries.      Impression:         1. Cardiomegaly. No pulmonary edema.  2. Mildly distended air-filled colon which may be due to ileus.    Ivin Booty  D'Heureux, MD   04/26/2018 8:48 AM    XR Knee 1 Or 2 Views Left [[id number]] Collected:  04/26/18 0845    Order Status:  Completed Updated:  04/26/18 0852    Narrative:       History: Pain.    FINDINGS: AP and lateral views were obtained and are compared to  04/09/2018.    Again noted is a large left knee joint effusion. There is no evidence of  a fracture or bony erosion. There is moderate degenerative arthritis  with joint space narrowing and marginal osteophyte formation. Extensive  vascular calcifications are present.      Impression:         1. Unchanged since 04/09/2018.  2.  Large left knee joint effusion.  3. Moderate degenerative arthritis in the left knee.    Marjean Donna, MD   04/26/2018 8:48 AM        Hospitalist:   Signed by:   Sherre Scarlet  04/26/2018 12:45 PM    *This note was generated by the Epic EMR system/ Dragon speech recognition and may contain inherent errors or omissions not intended by the user. Grammatical errors, random word insertions, deletions, pronoun errors and incomplete sentences are occasional consequences of this technology due to software limitations. Not all errors are caught or corrected. If there are questions or concerns about the content of this note or information contained within the body of this dictation they should be addressed directly with the author for clarification.

## 2018-04-26 NOTE — ED Triage Notes (Signed)
Developed worse knee pain from baseline on Monday night.  Tuesday felt nauseated and vomited 7 times, was unable to tolerate his home medications.  Around 1730 he began to feel better but his knee pain worsened and persisted overnight.  This morning he arrives by EMS with the chief complaint of bilateral knee pain, history of arthritis and gout.  Also presents to triage febrile, 101.4 oral.  Admitted in December for gout flare up, has been unable to follow up yet per his family.

## 2018-04-26 NOTE — ED Notes (Signed)
138 dexi

## 2018-04-26 NOTE — EDIE (Signed)
COLLECTIVE?NOTIFICATION?04/26/2018 06:01?Marco Bruce?MRN: [id number]    [place] patient encounter information:   DHR:?[id number]  Account [id number]  Billing Account [id number]      Criteria Met      5 ED Visits in 12 Months    Security and Safety  No recent Security Events currently on file    ED Care Guidelines  There are currently no ED Care Guidelines for this patient. Please check your facility's medical records system.        Prescription Monitoring Program  000??- Narcotic Use Score  000??- Sedative Use Score  000??- Stimulant Use Score  000??- Overdose Risk Score  - All Scores range from 000-999 with 75% of the population scoring < 200 and on 1% scoring above 650  - The last digit of the narcotic, sedative, and stimulant score indicates the number of active prescriptions of that type  - Higher Use scores correlate with increased prescribers, pharmacies, mg equiv, and overlapping prescriptions  - Higher Overdose Risk Scores correlate with increased risk of unintentional overdose death   Concerning or unexpectedly high scores should prompt a review of the PMP record; this does not constitute checking PMP for prescribing purposes.      E.D. Visit Count (12 mo.)  Facility Visits   [facility] 2   Total 6   Note: Visits indicate total known visits.      Recent Emergency Department Visit Summary  Date Facility [facility] Type Diagnoses or Chief Complaint   Apr 26, 2018 [place]      gout      Apr 08, 2018 [place]      TRIAGE-      Knee Pain      Nausea      Emesis      Abdominal Pain      Other chronic pain      Unspecified kidney failure      Jan 07, 2018 [place]      triage      triage knee pain      triage knee pain/ vomiting      Cough      Hematemesis      Knee Pain      Vomiting, unspecified      Non-ST elevation (NSTEMI) myocardial infarction       1 Chronic kidney disease, stage 4 (severe)      Presence of cardiac pacemaker      Dec 01, 2017 [place]      Abd pain      Shortness of Breath      Knee Pain      Jun 02, 2017 [place] [facility]      illness      Fever      Hip Pain      Joint Pain      Weakness      Hypo-osmolality and hyponatremia      Mixed hyperlipidemia      Essential (primary) hypertension      Long term (current) use of insulin      1 Chronic kidney disease, stage 4 (severe)      Apr 29, 2017 [place]      abdominal pain      Nausea with  vomiting, unspecified      Generalized abdominal pain      Disorder of kidney and ureter, unspecified          Recent Inpatient Visit Summary  Date [place] Type Diagnoses or Chief Complaint   Apr 08, 2018 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      1 Chronic kidney disease, stage 4 (severe)      Presence of cardiac pacemaker      Acute on chronic systolic (congestive) heart failure      Vomiting, unspecified      Non-ST elevation (NSTEMI) myocardial infarction      Bilateral primary osteoarthritis of knee      Low back pain      Acute on chronic diastolic (congestive) heart failure      Dec 01, 2017 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      Weakness      Hypo-osmolality and hyponatremia      Essential (primary) hypertension      1 Chronic  kidney disease, stage 4 (severe)      Mixed hyperlipidemia      1 Type 2 diabetes mellitus with unspecified complications      Long term (current) use of insulin      Acute on chronic diastolic (congestive) heart failure      Chronic systolic (congestive) heart failure          Care Team  Provider Specialty Phone Fax Service Dates   Ernest Mallick , MD Family Medicine [phone] Current    Rhea Bleacher Case Manager/Care Coordinator [phone]  Current    Rhea Bleacher Primary Care [phone]  Current      Collective Portal  This patient has registered at the [facility] Emergency Department   For more information visit: [email or web address]     PLEASE NOTE:     1.   Any care recommendations and other clinical information are provided as guidelines or for historical purposes only, and providers should exercise their own clinical judgment when providing care.    2.   You may only use this information for purposes of treatment, payment or health care operations activities, and subject to the limitations of applicable [employer].    3.   You should consult directly with the organization that provided a care guideline or other clinical history with any questions about additional information or accuracy or completeness of information provided.    ? 2020 [employer] - [email or web address]

## 2018-04-26 NOTE — Plan of Care (Signed)
Problem: Compromised Tissue integrity  Goal: Damaged tissue is healing and protected  Flowsheets (Taken 04/26/2018 1718)  Damaged tissue is healing and protected : Monitor/assess Braden scale every shift; Provide wound care per wound care algorithm; Reposition patient every 2 hours and as needed unless able to reposition self; Increase activity as tolerated/progressive mobility; Relieve pressure to bony prominences for patients at moderate and high risk; Avoid shearing injuries; Use bath wipes, not soap and water, for daily bathing; Keep intact skin clean and dry; Use incontinence wipes for cleaning urine, stool and caustic drainage. Foley care as needed; Encourage use of lotion/moisturizer on skin; Monitor external devices/tubes for correct placement to prevent pressure, friction and shearing  Goal: Nutritional status is improving  Flowsheets (Taken 04/26/2018 1718)  Nutritional status is improving: Assist patient with eating; Allow adequate time for meals; Encourage patient to take dietary supplement(s) as ordered; Include patient/patient care companion in decisions related to nutrition

## 2018-04-26 NOTE — Telephone Encounter (Signed)
Patient's daughter [name] aware of Dr. Delos Haring recommendations. He is currently in the hospital for vomiting, sneezing, and gout attack.

## 2018-04-26 NOTE — ED Provider Notes (Signed)
Physician/Midlevel provider first contact with patient: 04/26/18 1941         History     Chief Complaint   Patient presents with    Knee Pain    Fever     HPI     Patient Name: Marco Bruce, 85 y.o., Marco Bruce      DR. Paralee Pendergrass  is the primary attending for this patient and performed the HPI, PE, and medical decision making for this patient.    History obtained by: patient/family.  CC/Onset/Duration/Quality/Location/Severity/Context/Associated Symptoms/Aggravating or Alleviating Factors: Presents with chronic knee pain presenting with worsening bilateral knee pain since Monday.  Patient has a history of chronic knee pain that exacerbates for the last 2 years and had a recent evaluation at [facility] with a negative work-up.  However yesterday developed nausea and vomiting x7 which is currently improved alongside with fever and chills.  Is a chronic cough per family.  Patient denies any abdominal pain, chest pain, shortness of breath, headache, neck pain.  Pain is worse with movement of the knees bilaterally, no alleviating factors.      I have obtained and reviewed old records for this patient. Findings indicate recent admission to hospital from January 4-7 with evidence of possible gouty attack treated with colchicine which led to some acute kidney injury.      *This note was generated by the Epic EMR system/Voice recognition system and may contain inherent errors or omissions not intended by the user. Grammatical errors, random word insertions, deletions, pronoun errors and incomplete sentences are occasional consequences of this technology due to software limitations. Not all errors are caught or corrected. If there are questions or concerns about the content of this note or information contained within the body of this dictation they should be addressed directly with the author for clarification          Past Medical History:   Diagnosis Date    Acute diastolic heart failure 74/11/1446    Acute on  chronic diastolic congestive heart failure 08/13/2016    Acute pulmonary edema 05/26/2015    Chronic diastolic heart failure 18/Marco/3149    Chronic gout     Chronic pain syndrome 07/07/2015    CKD (chronic kidney disease) stage 4, GFR 15-29 ml/min     2018 baseline creatinine in EPIC is 1.5-2.3.    Congestive heart failure (CHF)     EF 40% 5/18 echo, on lasix    Current chronic use of systemic steroids     For Polymyalgia rheumatica    Essential hypertension     Mixed hyperlipidemia     NSTEMI (non-ST elevated myocardial infarction) 08/13/2016    Osteoarthritis of both knees     Peripheral edema 06/16/2015    Pneumonia due to infectious organism, unspecified laterality, unspecified part of lung 06/25/2015    Polymyalgia rheumatica     chronic steroids    Renovascular hypertension 08/04/2015    Second degree heart block by electrocardiogram (ECG) 04/27/2015    Type 2 diabetes mellitus with complication, with long-term current use of insulin     12/01/2016 Hemoglobin A1c 8.2%       Past Surgical History:   Procedure Laterality Date    ABDOMINAL SURGERY      CHOLECYSTECTOMY      PACEMAKER Left 2017       Family History   Problem Relation Age of Onset    Hypertension Father        Social  Social History  Tobacco Use    Smoking status: Never Smoker    Smokeless tobacco: Never Used   Substance Use Topics    Alcohol use: No     Comment: former alcoholic    Drug use: No       .     No Known Allergies    Home Medications             albuterol-ipratropium (DUO-NEB) 2.5-0.5(3) mg/3 mL nebulizer     Take 3 mLs by nebulization 2 (two) times daily as needed (SOB)     allopurinol (ZYLOPRIM) 100 MG tablet     Take 1 tablet (100 mg total) by mouth daily     aspirin 81 MG chewable tablet     Chew 1 tablet (81 mg total) by mouth daily     atorvastatin (LIPITOR) 40 MG tablet     Take 1 tablet (40 mg total) by mouth nightly     furosemide (LASIX) 40 MG tablet     Take 1 tablet (40 mg total) by mouth daily      gabapentin (NEURONTIN) 100 MG capsule     TAKE ONE CAPSULE BY MOUTH EVERY 8 HOURS     insulin glargine (LANTUS SOLOSTAR) 100 UNIT/ML injection pen     Inject 25 Units into the skin nightly     linaGLIPtin 5 MG Tab     Take 1 tablet (5 mg total) by mouth daily     metoprolol succinate XL (TOPROL-XL) 25 MG 24 hr tablet     Take 0.5 tablets (12.5 mg total) by mouth daily     pantoprazole (PROTONIX) 40 MG tablet     Take 1 tablet (40 mg total) by mouth nightly     predniSONE (DELTASONE) 5 MG tablet     Starting 1/8: Take 20 mg x 3 days, then on 1/11: take 10 mg x 3 days. Then continue taking 5 mg daily until you receive further instructions     sodium bicarbonate 650 MG tablet     Take 2 tablets (1,300 mg total) by mouth 2 (two) times daily           Review of Systems   Constitutional: Positive for fever.   Respiratory: Negative for shortness of breath.    Cardiovascular: Negative for chest pain.   Gastrointestinal: Positive for nausea and vomiting. Negative for abdominal pain.   Musculoskeletal: Negative for neck pain.   Skin: Negative for pallor and rash.   Neurological: Negative for syncope and headaches.   All other systems reviewed and are negative.      Physical Exam    BP: 170/73, Heart Rate: 75, Temp: (!) 101.4 F (38.6 C), Resp Rate: 22, SpO2: 97 %, Weight: 68 kg    Physical Exam  Vitals signs and nursing note reviewed.   Constitutional:       General: He is not in acute distress.  HENT:      Head: Normocephalic and atraumatic.      Right Ear: External ear normal.      Left Ear: External ear normal.      Nose: Nose normal.      Mouth/Throat:      Pharynx: Oropharynx is clear. No oropharyngeal exudate or posterior oropharyngeal erythema.   Eyes:      General:         Right eye: No discharge.         Left eye: No discharge.      Extraocular Movements: Extraocular  movements intact.      Conjunctiva/sclera: Conjunctivae normal.      Pupils: Pupils are equal, round, and reactive to light.   Neck:       Musculoskeletal: Normal range of motion and neck supple.   Cardiovascular:      Rate and Rhythm: Normal rate and regular rhythm.      Pulses: Normal pulses.      Heart sounds: Normal heart sounds.   Pulmonary:      Effort: Pulmonary effort is normal. No respiratory distress.      Breath sounds: Normal breath sounds.   Abdominal:      General: Bowel sounds are normal.      Palpations: Abdomen is soft.      Tenderness: There is no abdominal tenderness. There is no right CVA tenderness or left CVA tenderness.   Musculoskeletal:      Comments: Pain along bilateral knees equally upon range of motion.  No evidence of erythema around the knees.  Left knee is slightly swollen compared to the right.  No evidence of cellulitis, erythema, warmth around the knee.  There is a mild rash in the right mid thigh that is not overtly cellulitic looking but is mildly erythematous.  Minimal pain in that area.  No proximal lymphangitis.   Skin:     General: Skin is warm and dry.      Findings: No rash.   Neurological:      General: No focal deficit present.      Mental Status: He is alert and oriented to person, place, and time.      Cranial Nerves: No cranial nerve deficit.      Coordination: Coordination normal.           MDM and ED Course     ED Medication Orders (From admission, onward)    Start Ordered     Status Ordering Provider    04/26/18 1030 04/26/18 0952  vancomycin (VANCOCIN) 1,500 mg in sodium chloride 0.9 % 500 mL IVPB  Once in ED     Route: Intravenous  Ordered Dose: 1,500 mg     Orlena Sheldon, Tiyah Zelenak    04/26/18 5974 04/26/18 0952  cefTRIAXone (ROCEPHIN) 1 g in sodium chloride 0.9 % 100 mL IVPB mini-bag plus  Once     Route: Intravenous  Ordered Dose: 1 g     Ordered Regan Rakers, Masiah Lewing    04/26/18 0912 04/26/18 0911  lidocaine-EPINEPHrine (XYLOCAINE W/EPI) 1 %-1:100000 injection 10 mL  Once     Route: Intradermal  Ordered Dose: 10 mL     Last MAR action:  Given by Other [facility], [facility]    04/26/18 0909 04/26/18 0908     Once     Route: Intradermal  Ordered Dose: 1 mL     Discontinued Gaige Fussner    04/26/18 0732 04/26/18 0731  morphine injection 2 mg  Once     Route: Intravenous  Ordered Dose: 2 mg     Last MAR action:  Given Ashya Nicolaisen    04/26/18 0732 04/26/18 0731  ondansetron (ZOFRAN) injection 4 mg  Once     Route: Intravenous  Ordered Dose: 4 mg     Last MAR action:  Given Garrit Marrow    04/26/18 0731 04/26/18 0730  acetaminophen (TYLENOL) tablet 1,000 mg  Once     Route: Oral  Ordered Dose: 1,000 mg     Last MAR action:  Given [facility], [facility]    04/26/18 0717 04/26/18 0716  dextrose 50 % bolus 25 mL  Once     Route: Intravenous  Ordered Dose: 25 mL     Last MAR action:  Given Regan Rakers, [facility]    04/26/18 0629 04/26/18 0628  sodium chloride 0.9 % bolus 500 mL  Once     Route: Intravenous  Ordered Dose: 500 mL     Last [facility] action:  [employer], [facility]    04/26/18 0629 04/26/18 0628  0.9%  NaCl infusion  Continuous     Route: Intravenous     Last MAR action:  Hold Dorrell Mitcheltree             MDM  Number of Diagnoses or Management Options  Diagnosis management comments:   Differential Diagnosis to include but not limited to : viral, pneumonia, sepsis, uti, pharnygtis.    Oxygen saturation by pulse oximetry is 95%-100%, Normal.  Interventions: None Needed.    Attending Dr. Cleotis Nipper         Amount and/or Complexity of Data Reviewed  Clinical lab tests: ordered and reviewed  Tests in the radiology section of CPT: ordered and reviewed  Independent visualization of images, tracings, or specimens: yes    Risk of Complications, Morbidity, and/or Mortality  Presenting problems: high  Diagnostic procedures: high  Management options: high          ED Course as of Apr 27 1003   Wed Apr 26, 2018   6384 EKG Interpretation  EKG interpreted by Dr. Cleotis Nipper    Rate: Normal  Rhythm: sinus rhythm  Axis: paced  ST-T Segments: paced, no acute changes   Conduction: paced  Impression: paced rhythm, further eval limited.       Cleotis Nipper, MD        [PV]      ED Course User Index  [PV] Cleotis Nipper, MD       ED course and reassessment at time of admission and final disposition -patient stable in emergency department.  No distress.  Arthrocentesis done of bilateral knees with evidence of serosanguineous fluid most consistent with inflammatory versus infection.  IV antibiotics given after blood cultures drawn.  With hypoglycemia which is improved with IV dextrose and oral challenge.    Results discussed with patient and/or family.    I spoke to admitting physician regarding admission. Pt presentation, course and results relayed to admitting doctor.    Results     Procedure Component Value Units Date/Time    Crystals - Body Fluid [[id number]] Collected:  04/26/18 0945    Specimen:  Joint Fluid from Synovial Fluid Updated:  04/26/18 0953    Culture and Gram Stain - Body Fluid [[id number]] Collected:  04/26/18 0942    Specimen:  Joint Fluid from Synovial Fluid Updated:  04/26/18 0946    Protein - Body Fluid [[id number]] Collected:  04/26/18 0946    Specimen:  Joint Fluid from Synovial Fluid Updated:  04/26/18 0946    Narrative:       Attention:Body Fluid    Culture and Gram Stain - Body Fluid [[id number]] Collected:  04/26/18 0942    Specimen:  Joint Fluid from Synovial Fluid Updated:  04/26/18 0946    Cell Count - Body Fluid [[id number]] Resulted:  04/26/18 0909    Specimen:  Joint Fluid from Body Fluid Updated:  04/26/18 0909     Body Fluid Source: Synovial Fluid    Glucose - Body Fluid [[id number]] Resulted:  04/26/18 4801    Specimen:  Joint Fluid  from Synovial Fluid Updated:  04/26/18 0909     Body Fluid Source: Synovial Fluid    Cell Count - Body Fluid [[id number]] Resulted:  04/26/18 0909    Specimen:  Joint Fluid from Body Fluid Updated:  04/26/18 0909     Body Fluid Source: Synovial Fluid    Glucose Whole Blood - POCT [[id number]] Collected:  04/26/18 0838     Updated:  04/26/18 0841     POCT - Glucose Whole blood 92 mg/dL      Glucose Whole Blood - POCT [[id number]]  (Abnormal) Collected:  04/26/18 0739     Updated:  04/26/18 0742     POCT - Glucose Whole blood 137 mg/dL     Blood Culture Aerobic/Anaerobic #1 [[id number]] Collected:  04/26/18 0740    Specimen:  Arm from Blood, Venipuncture Updated:  04/26/18 0741    Narrative:       1 BLUE+1 PURPLE    Blood Culture Aerobic/Anaerobic #2 [[id number]] Collected:  04/26/18 0740    Specimen:  Arm from Blood, Venipuncture Updated:  04/26/18 0741    Narrative:       1 BLUE+1 PURPLE    C Reactive Protein [[id number]]  (Abnormal) Collected:  04/26/18 0629    Specimen:  Blood Updated:  04/26/18 0715     C-Reactive Protein 19.1 mg/dL     Narrative:       Replace urinary catheter prior to obtaining the urine culture  if it has been in place for greater than or equal to 14  days:->N/A No Foley  Indications for U/A Reflex to Micro - Reflex to  Culture:->Suprapubic Pain/Tenderness or Dysuria    Magnesium [[id number]] Collected:  04/26/18 0629    Specimen:  Blood Updated:  04/26/18 0715     Magnesium 1.8 mg/dL     Narrative:       Replace urinary catheter prior to obtaining the urine culture  if it has been in place for greater than or equal to 14  days:->N/A No Foley  Indications for U/A Reflex to Micro - Reflex to  Culture:->Suprapubic Pain/Tenderness or Dysuria    GFR [[id number]] Collected:  04/26/18 0629     Updated:  04/26/18 0715     EGFR 26.9    Narrative:       Replace urinary catheter prior to obtaining the urine culture  if it has been in place for greater than or equal to 14  days:->N/A No Foley  Indications for U/A Reflex to Micro - Reflex to  Culture:->Suprapubic Pain/Tenderness or Dysuria    Comprehensive metabolic panel [[id number]]  (Abnormal) Collected:  04/26/18 0629    Specimen:  Blood Updated:  04/26/18 0715     Glucose 36 mg/dL      BUN 40.2 mg/dL      Creatinine 2.3 mg/dL      Sodium 138 mEq/L      Potassium 4.7 mEq/L      Chloride 104 mEq/L      CO2 23 mEq/L      Calcium 9.9 mg/dL       Protein, Total 6.7 g/dL      Albumin 4.1 g/dL      AST (SGOT) 26 U/L      ALT 9 U/L      Alkaline Phosphatase 49 U/L      Bilirubin, Total 0.9 mg/dL      Globulin 2.6 g/dL      Albumin/Globulin Ratio 1.6     Anion Gap 11.0  Narrative:       Replace urinary catheter prior to obtaining the urine culture  if it has been in place for greater than or equal to 14  days:->N/A No Foley  Indications for U/A Reflex to Micro - Reflex to  Culture:->Suprapubic Pain/Tenderness or Dysuria    UA Reflex to Micro - Reflex to Culture [[id number]]  (Abnormal) Collected:  04/26/18 0629     Updated:  04/26/18 0708     Urine Type Urine, Clean Ca     Color, UA YELLOW     Clarity, UA CLEAR     Specific Gravity UA 1.017     Urine pH 8.0     Leukocyte Esterase, UA NEGATIVE     Nitrite, UA NEGATIVE     Protein, UR 100     Glucose, UA NEGATIVE     Ketones UA NEGATIVE     Urobilinogen, UA 0.2 mg/dL      Bilirubin, UA NEGATIVE     Blood, UA SMALL     RBC, UA 3 - 5 /hpf      WBC, UA 0 - 5 /hpf      Squamous Epithelial Cells, Urine 0 - 5 /hpf     Narrative:       Replace urinary catheter prior to obtaining the urine culture  if it has been in place for greater than or equal to 14  days:->N/A No Foley  Indications for U/A Reflex to Micro - Reflex to  Culture:->Suprapubic Pain/Tenderness or Dysuria    Sedimentation rate (ESR) [[id number]]  (Abnormal) Collected:  04/26/18 0629    Specimen:  Blood Updated:  04/26/18 0659     Sed Rate 48 mm/Hr     Narrative:       Replace urinary catheter prior to obtaining the urine culture  if it has been in place for greater than or equal to 14  days:->N/A No Foley  Indications for U/A Reflex to Micro - Reflex to  Culture:->Suprapubic Pain/Tenderness or Dysuria    PT/APTT [[id number]]  (Abnormal) Collected:  04/26/18 0629     Updated:  04/26/18 0654     PT 14.1 sec      PT INR 1.1     PTT 43 sec     Narrative:       Replace urinary catheter prior to obtaining the urine culture  if it has been in place for greater than  or equal to 14  days:->N/A No Foley  Indications for U/A Reflex to Micro - Reflex to  Culture:->Suprapubic Pain/Tenderness or Dysuria    Lactic acid, plasma [[id number]] Collected:  04/26/18 0629    Specimen:  Blood Updated:  04/26/18 0653     Lactic acid 1.0 mmol/L     Rapid influenza A/B antigens [[id number]] Collected:  04/26/18 2505    Specimen:  Nasopharyngeal from Nasal Aspirate Updated:  04/26/18 0650    Narrative:       ORDER#: [id number]                                    ORDERED BY: Cleotis Nipper  SOURCE: Nasal Aspirate                               COLLECTED:  04/26/18 06:29  ANTIBIOTICS AT COLL.:  RECEIVED :  04/26/18 06:36  Influenza Rapid Antigen A&B                FINAL       04/26/18 06:50  04/26/18   Negative for Influenza A and B             Reference Range: Negative      Rapid Strep (Group A Antigen) [[id number]] Collected:  04/26/18 0629    Specimen:  Throat Updated:  04/26/18 0645     Group A Strep, Rapid Antigen Negative    CBC and differential [[id number]]  (Abnormal) Collected:  04/26/18 0629    Specimen:  Blood Updated:  04/26/18 0642     WBC 11.Marco x10 3/uL      Hgb 12.5 g/dL      Hematocrit 38.3 %      Platelets 199 x10 3/uL      RBC 4.20 x10 6/uL      MCV 91.2 fL      MCH 29.8 pg      MCHC 32.6 g/dL      RDW 17 %      MPV 10.0 fL      Neutrophils 73.4 %      Lymphocytes Automated 13.8 %      Monocytes 11.8 %      Eosinophils Automated 0.1 %      Basophils Automated 0.3 %      Immature Granulocyte 0.6 %      Nucleated RBC 0.0 /100 WBC      Neutrophils Absolute 8.49 x10 3/uL      Abs Lymph Automated 1.59 x10 3/uL      Abs Mono Automated 1.36 x10 3/uL      Abs Eos Automated 0.01 x10 3/uL      Absolute Baso Automated 0.04 x10 3/uL      Absolute Immature Granulocyte 0.07 x10 3/uL      Absolute NRBC 0.00 x10 3/uL     Narrative:       Replace urinary catheter prior to obtaining the urine culture  if it has been in place for greater than or equal to 14  days:->N/A No  Foley  Indications for U/A Reflex to Micro - Reflex to  Culture:->Suprapubic Pain/Tenderness or Dysuria          Radiology Results (24 Hour)     Procedure Component Value Units Date/Time    XR Knee 1 Or 2 Views Right [[id number]] Collected:  04/26/18 0848    Order Status:  Completed Updated:  04/26/18 0853    Narrative:       History: Pain.    FINDINGS: AP and lateral views were obtained and are compared to  04/09/2018.    There is a moderate right knee joint effusion which has increased since  the last exam. There is no evidence of a fracture or bony erosion. There  is moderate degenerative arthritis with joint space narrowing and  osteophyte formation. Vascular calcifications are present.      Impression:         1. Moderate right knee joint effusion increasing since 04/09/2018.  2. Moderate degenerative arthritis in the right knee.    Marjean Donna, MD   04/26/2018 8:49 AM    Abdomen 2 View With Chest 1 View [[id number]] Collected:  04/26/18 0844    Order Status:  Completed Updated:  04/26/18 7482    Narrative:       AP upright view of the chest  Upright and supine views of the abdomen    CLINICAL INDICATION: Vomiting. Comparison 04/08/2018.    FINDINGS: Pacemaker leads overlie the right ventricle and right atrium.  The heart is enlarged. Aorta is uncoiled.. Lungs are clear with normal  pulmonary vascularity. No pleural effusion, hilar or mediastinal  prominence is evident.    No free intraperitoneal air is seen. Colon is mildly distended with air.  No air-fluid levels are seen. No dilated small bowel loops are seen.  Patient is status post cholecystectomy. Atherosclerotic plaque in the  abdominal aorta and bilateral common iliac arteries.      Impression:         1. Cardiomegaly. No pulmonary edema.  2. Mildly distended air-filled colon which may be due to ileus.    Ivin Booty  D'Heureux, MD   04/26/2018 8:48 AM    XR Knee 1 Or 2 Views Left [[id number]] Collected:  04/26/18 0845    Order Status:  Completed Updated:   04/26/18 0852    Narrative:       History: Pain.    FINDINGS: AP and lateral views were obtained and are compared to  04/09/2018.    Again noted is a large left knee joint effusion. There is no evidence of  a fracture or bony erosion. There is moderate degenerative arthritis  with joint space narrowing and marginal osteophyte formation. Extensive  vascular calcifications are present.      Impression:         1. Unchanged since 04/09/2018.  2. Large left knee joint effusion.  3. Moderate degenerative arthritis in the left knee.    Marjean Donna, MD   04/26/2018 8:48 AM                      Arthrocentesis  Date/Time: 04/26/2018 10:03 AM  Performed by: Cleotis Nipper, MD  Authorized by: Cleotis Nipper, MD     Consent:     Consent obtained:  Written    Consent given by:  Guardian    Risks discussed:  Bleeding, infection, nerve damage and pain    Alternatives discussed:  No treatment  Location:     Location:  Knee    Knee:  R knee  Anesthesia (see MAR for exact dosages):     Anesthesia method:  Local infiltration    Local anesthetic:  Lidocaine 1% WITH epi  Procedure details:     Preparation: Patient was prepped and draped in usual sterile fashion      Needle gauge:  20 G    Approach:  Lateral    Aspirate amount:  25 ml    Aspirate characteristics:  Blood-tinged    Steroid injected: no      Specimen collected: yes    Post-procedure details:     Dressing:  Adhesive bandage    Patient tolerance of procedure:  Tolerated well, no immediate complications  Arthrocentesis  Date/Time: 04/26/2018 10:04 AM  Performed by: Cleotis Nipper, MD  Authorized by: Cleotis Nipper, MD     Consent:     Consent obtained:  Written    Consent given by:  Guardian    Risks discussed:  Bleeding, infection, pain and nerve damage    Alternatives discussed:  No treatment  Location:     Location:  Knee    Knee:  L knee  Anesthesia (see MAR for exact dosages):     Anesthesia method:  Local infiltration    Local anesthetic:  Lidocaine 1% WITH epi  Procedure details:     Preparation: Patient was prepped and draped in usual sterile fashion      Needle gauge:  20 G    Approach:  Medial    Aspirate amount:  50 ml    Aspirate characteristics:  Serous    Steroid injected: no    Post-procedure details:     Dressing:  Adhesive bandage    Patient tolerance of procedure:  Tolerated well, no immediate complications        Clinical Impression & Disposition     Clinical Impression  Final diagnoses:   Fever in adult   Hypoglycemia   Chronic pain of both knees   Chronic kidney disease, unspecified CKD stage        ED Disposition     ED Disposition Condition Date/Time Comment    Admit  Wed Apr 26, 2018  9:51 AM            New Prescriptions    No medications on file                 Cleotis Nipper, MD  04/26/18 1005

## 2018-04-26 NOTE — ED Notes (Signed)
Nursing Communication - Admission Information:  Diagnosis:   1. Fever in adult     2. Hypoglycemia     3. Chronic pain of both knees     4. Chronic kidney disease, unspecified CKD stage       Mobility:ambulatory with walker   Isolation: No active isolations  Patient comes from: home   Neuro: gcs -15  Special needs:   Drips:   Current Facility-Administered Medications   Medication Dose Route Frequency Last Rate    sodium chloride   Intravenous Continuous Stopped (04/26/18 0657)

## 2018-04-26 NOTE — Consults (Signed)
INFECTIOUS DISEASE CONSULT  Marco Royals, MD, Marco Bruce          Date Time: 04/26/18 3:45 PM  Patient Name: Marco Bruce  Referring Physician: Sherre Scarlet, MD      Reason for consult:     Acute gouty arthritis; Cellulitis    History of present illness:     Marco Bruce CSN:[id number],MRN:[id number] is a 85 y.o. male, whose history is obtained from the records and family as he is not providing any history at present. According to the records, he has a history of congestive heart failure, coronary artery disease, chronic kidney disease, atrial fibrillation, status post pacemaker placement, hypertension, diabetes mellitus, anemia, polymyalgia rheumatica, gout, who was recently discharged from the [facility] with acute gouty arthropathy and presented back to the emergency room with worsening bilateral knee pain, 10/10, sharp, constant, crampy, associated with fevers, nausea, vomiting, inability to walk and bear any weight, no relieving factors.  He underwent arthrocentesis which revealed 5081 WBCs and positive for mono sodium urate crystals.    Review of systems:     Constitutional:   Complains of fevers, chills, malaise/fatigue.  HEENT: Denies any double vision, photophobia, pain, discharge and redness.   Respiratory:  Complains of cough, and wheezing.   Cardiovascular: Denies any chest pain, palpitations, orthopnea, claudication.  Gastrointestinal:  Complains of nausea and vomiting.   Genitourinary: Denies any dysuria, urgency, frequency, hematuria and flank pain.   Musculoskeletal:  Complains of myalgias, back pain, joint pain  Skin: Denies any itching and rash.   Neurological:  Complains of generalized weakness, malaise, difficult to ambulate   Endo/Heme/Allergies: Denies any environmental allergies and polydipsia. Does not bruise/bleed easily.   Psychiatric/Behavioral:  Anxious and nervous  Other review of systems are noncontributory.    Allergies:     No Known Allergies    Past medical history:      Past Medical History:   Diagnosis Date    Acute diastolic heart failure 80/99/8338    Acute on chronic diastolic congestive heart failure 08/13/2016    Acute pulmonary edema 05/26/2015    Chronic diastolic heart failure 25/08/3974    Chronic gout     Chronic pain syndrome 07/07/2015    CKD (chronic kidney disease) stage 4, GFR 15-29 ml/min     2018 baseline creatinine in EPIC is 1.5-2.3.    Congestive heart failure (CHF)     EF 40% 5/18 echo, on lasix    Current chronic use of systemic steroids     For Polymyalgia rheumatica    Essential hypertension     Mixed hyperlipidemia     NSTEMI (non-ST elevated myocardial infarction) 08/13/2016    Osteoarthritis of both knees     Peripheral edema 06/16/2015    Pneumonia due to infectious organism, unspecified laterality, unspecified part of lung 06/25/2015    Polymyalgia rheumatica     chronic steroids    Renovascular hypertension 08/04/2015    Second degree heart block by electrocardiogram (ECG) 04/27/2015    Type 2 diabetes mellitus with complication, with long-term current use of insulin     12/01/2016 Hemoglobin A1c 8.2%       Past surgical history:     Past Surgical History:   Procedure Laterality Date    ABDOMINAL SURGERY      CHOLECYSTECTOMY      PACEMAKER Left 2017       Family history:     Family History   Problem Relation Age of Onset    Hypertension Father  Social history:     Social History     Substance and Sexual Activity   Alcohol Use No    Comment: former alcoholic     Social History     Substance and Sexual Activity   Drug Use No     Social History     Tobacco Use   Smoking Status Never Smoker   Smokeless Tobacco Never Used       Medications:     Current Facility-Administered Medications   Medication Dose Route Frequency    acetaminophen  650 mg Oral 4 times per day    allopurinol  100 mg Oral Daily    aspirin  81 mg Oral Daily    atorvastatin  40 mg Oral QHS    [START ON 04/27/2018] cefTRIAXone  1 g Intravenous Q24H     furosemide  40 mg Oral Daily    gabapentin  100 mg Oral Q8H    heparin (porcine)  5,000 Units Subcutaneous Q12H Marco Bruce    insulin glargine  15 Units Subcutaneous QHS    insulin lispro  1-8 Units Subcutaneous TID AC    lactobacillus/streptococcus  1 capsule Oral Daily    methylPREDNISolone  40 mg Intravenous Q6H    metoprolol succinate XL  12.5 mg Oral Daily    pantoprazole  40 mg Oral QAM AC    sodium bicarbonate  1,300 mg Oral BID       Physical Exam:     Blood pressure 119/47, pulse 73, temperature 99.2 F (37.3 C), temperature source Oral, resp. rate 22, height 1.575 m (5\' 2" ), weight 68 kg (150 lb), SpO2 92 %.    General Appearance:  Sick-looking.   HEENT: Pallor positive, Anicteric sclera.   Neck: Supple  Lungs:   Decreased breath sound at bases  Chest Wall: Symmetric chest wall expansion.   Heart : S1 and S2.   Abdomen: Abdomen is soft. There are no signs of ascites. Bowel sounds are normal. There is no abdominal tenderness. There is no mass. There is no splenomegaly or hepatomegaly.  Neurological:  Sleepy and lethargic  Extremities: Multiple joint pains, inability to bend knees, especially the right; decreased range of motion     Labs:     Recent Labs     04/26/18  0629   WBC 11.56*   Hgb 12.5   Hematocrit 38.3   Platelets 199   MCV 91.2       Recent Labs     04/26/18  0629   Sodium 138   Potassium 4.7   Chloride 104   CO2 23   BUN 40.2*   Creatinine 2.3*   Glucose 36*   Calcium 9.9   Magnesium 1.8       Recent Labs     04/26/18  0629   AST (SGOT) 26   ALT 9   Alkaline Phosphatase 49   Protein, Total 6.7   Albumin 4.1   Bilirubin, Total 0.9       Imaging studies:     X-ray: 1. Moderate right knee joint effusion   2. Moderate degenerative arthritis in the right knee.    Assessment :     Marco Bruce is a 85 y.o. male, with:     Systemic inflammatory response syndrome   Acute gouty arthropathy   Possible cellulitis; unlikely septic arthritis   Congestive heart  failure   Hypertension   Diabetes mellitus   Mitral regurgitation   Polymyalgia rheumatica   Chronic kidney disease  Status post pacemaker placement   Coronary artery disease    Recommendations:     I would like to suggest following approach:     Continue steroids   Rocephin 1 g IV daily   Correction of electrolytes   Blood cultures if spikes more than 100.5    Discussed with family   CBC, CMP tomorrow   We'll adjust the antimicrobials according to the cultures and clinical course     I will follow this patient closely with you    Thank you Deepmala for involving me in care of Marco Bruce          Signed by: Marco Royals, MD, FACP  Date Time: 04/26/18 3:45 PM      *This note was generated by the Epic EMR system/ Dragon speech recognition and may contain inherent errors or omissions not intended by the user. Grammatical errors, random word insertions, deletions, pronoun errors and incomplete sentences are occasional consequences of this technology due to software limitations. Not all errors are caught or corrected. If there are questions or concerns about the content of this note or information contained within the body of this dictation they should be addressed directly with the author for clarification

## 2018-04-27 ENCOUNTER — Other Ambulatory Visit (INDEPENDENT_AMBULATORY_CARE_PROVIDER_SITE_OTHER): Payer: Self-pay

## 2018-04-27 DIAGNOSIS — E1122 Type 2 diabetes mellitus with diabetic chronic kidney disease: Secondary | ICD-10-CM

## 2018-04-27 DIAGNOSIS — Z794 Long term (current) use of insulin: Secondary | ICD-10-CM

## 2018-04-27 DIAGNOSIS — I10 Essential (primary) hypertension: Secondary | ICD-10-CM

## 2018-04-27 DIAGNOSIS — L0390 Cellulitis, unspecified: Secondary | ICD-10-CM | POA: Diagnosis present

## 2018-04-27 DIAGNOSIS — Z950 Presence of cardiac pacemaker: Secondary | ICD-10-CM

## 2018-04-27 DIAGNOSIS — M109 Gout, unspecified: Secondary | ICD-10-CM

## 2018-04-27 DIAGNOSIS — N184 Chronic kidney disease, stage 4 (severe): Secondary | ICD-10-CM

## 2018-04-27 DIAGNOSIS — M009 Pyogenic arthritis, unspecified: Secondary | ICD-10-CM

## 2018-04-27 LAB — CBC
Absolute NRBC: 0 10*3/uL (ref 0.00–0.00)
Hematocrit: 30.7 % — ABNORMAL LOW (ref 37.6–49.6)
Hgb: 10.1 g/dL — ABNORMAL LOW (ref 12.5–17.1)
MCH: 30 pg (ref 25.1–33.5)
MCHC: 32.9 g/dL (ref 31.5–35.8)
MCV: 91.1 fL (ref 78.0–96.0)
MPV: 9.7 fL (ref 8.9–12.5)
Nucleated RBC: 0 /100 WBC (ref 0.0–0.0)
Platelets: 134 10*3/uL — ABNORMAL LOW (ref 142–346)
RBC: 3.37 10*6/uL — ABNORMAL LOW (ref 4.20–5.90)
RDW: 17 % — ABNORMAL HIGH (ref 11–15)
WBC: 5 10*3/uL (ref 3.10–9.50)

## 2018-04-27 LAB — GLUCOSE WHOLE BLOOD - POCT
Whole Blood Glucose POCT: 265 mg/dL — ABNORMAL HIGH (ref 70–100)
Whole Blood Glucose POCT: 257 mg/dL — ABNORMAL HIGH (ref 70–100)
Whole Blood Glucose POCT: 217 mg/dL — ABNORMAL HIGH (ref 70–100)
Whole Blood Glucose POCT: 289 mg/dL — ABNORMAL HIGH (ref 70–100)

## 2018-04-27 LAB — BASIC METABOLIC PANEL
Anion Gap: 8 (ref 5.0–15.0)
BUN: 53.5 mg/dL — ABNORMAL HIGH (ref 9.0–28.0)
CO2: 21 mEq/L — ABNORMAL LOW (ref 22–29)
Calcium: 8.5 mg/dL (ref 7.9–10.2)
Chloride: 107 mEq/L (ref 100–111)
Creatinine: 2.4 mg/dL — ABNORMAL HIGH (ref 0.7–1.3)
Glucose: 312 mg/dL — ABNORMAL HIGH (ref 70–100)
Potassium: 4.7 mEq/L (ref 3.5–5.1)
Sodium: 136 mEq/L (ref 136–145)

## 2018-04-27 LAB — GFR: EGFR: 25.6

## 2018-04-27 MED ORDER — PREDNISONE 20 MG PO TABS
20.0000 mg | ORAL_TABLET | Freq: Every morning | ORAL | Status: DC
Start: 2018-04-28 — End: 2018-04-28
  Administered 2018-04-28: 08:00:00 20 mg via ORAL
  Filled 2018-04-27: qty 1

## 2018-04-27 NOTE — Progress Notes (Signed)
Situation Pt is a 85 y.o M readmitted for acute gout arthropathy.      Background   Pt had a recent hospitalization at [facility] for acute Gout flare and was discharged on 1/7.  Daughter reported that pt did not f/u w/ PCP as she felt that he was doing OK and that she did talk to PCP over the phone regarding insulin dose adjustments. Per [facility] note, they were not able to reach pt and daughter did not return calls and as such pt did not receive H.H services. Grandson reported that pt always refused home nursing services and he'll be agreeable to Home P.T/O.T services.      Pt lives w/ daughter and grandson in a two level house w/ 13 STE and bedroom is on the main floor. Yolanda Bonine is pt's Theatre manager and assists as needed. Per grandson, he is with the pt during day time and daughter in the evenings. Per grandson, someone is with pt at all times. Pt ambulates inside the house w/ a R.W and one person assist. He uses the W/C outside. Pt requires assistance w/ ADLs. Yolanda Bonine drives him to appointments.    Assessment Met w/ pt and grandson at bedside. Pt speaks [place] and grandson interpreted. Yolanda Bonine is now requesting home P.T/O.T services. Advised daughter, [name] to call [employer] for [employer] transportation to MD appointments. Daughter, [name] prefers for pt to return to prior living arrangements after d/c. Referred pt to the [facility] Liaison.      Recommendation Home w/ family support; H.H services; Medicaid Waiver services and out patient f/u.          04/27/18 1736   Patient Type   Within 30 Days of Previous Admission? Yes   Healthcare Decisions   Interviewed: Patient;Family   Interviewee Contact Information: Danielle Rankin and Dtr, [name]   Orientation/Decision Making Abilities of Patient Alert and Oriented x3, able to make decisions  (Speaks [place] and grandson interpreted. )   Advance Directive Patient does not have advance directive   Healthcare Agent Appointed No   Additional Emergency  Contacts? [place], [place] [phone]   Prior to admission   Prior level of function Ambulates with assistive device;Needs assistance with ADLs  (Needs one person assistance)   Type of Residence Private residence   Home Layout Two level;Able to live on main level with bedroom/bathroom  (13 STE)   Living Arrangements Children;Family members  (Pt lives w/ daughter and grandson)   How do you get to your MD appointments? Grandson drives   Who fixes your meals? family cooks   Who picks up your prescriptions? family   Dressing Needs assistance   Grooming Needs assistance   Feeding Independent   Bathing Needs assistance   Toileting Needs assistance   DME Currently at [employer], [facility];[place], [place]   Name of [place] is the [employer] Aide    Frequency of services 8 hrs a day, 7 days a week   Prior SNF admission? (Detail) No   Adult Protective Services (APS) involved? No   Discharge Planning   Support Systems Children;Family members   Patient expects to be discharged to: Home   Anticipated [place] plan discussed with: Same as interviewed   Potential barriers to discharge: Decreased mobility   Mode of transportation: Private car (family member)   Does the patient have perscription coverage? Yes   Consults/Providers   PT Evaluation Needed 1  OT Evalulation Needed 1   SLP Evaluation Needed 2   Correct PCP listed in Epic? Yes   PCP   PCP on file was verified as the current PCP? Yes   Important Message from [facility] Notice   Patient received 1st IMM Letter? Yes   Date of most recent IMM given: 04/26/18

## 2018-04-27 NOTE — Telephone Encounter (Signed)
Please inform family that pt is at hospital and also had severe hypoglycemia when he went it.   Previously he was given 3 refills on lantus at last discharge.  So I suggest wait till discharge. Call back if pharmacy has no refill.

## 2018-04-27 NOTE — UM Notes (Signed)
[facility] Utilization Review   NPI [id number], Tax ID [id number]  Please call Billey Chang @ [phone] with any questions or concerns.  Email:  [email or web address]  Fax final authorization and requests for additional information to [phone]    OBS admission 1/22    04/26/18 0613 -- 101.4 F (38.6 C) Oral 75 97 % -- 22 170/73 -- -- -- -- 10     Abn labs;  WBC 11.56, Glucose 36, BUN 40.2, Cr 2.3, PTT 43, sed rate 48, c-reactive 19.1    Urine;  Protein 100, small blood    Rapid flu a/b negative    Group A step negative    Blood and throat cx sent    Gram stain and body fluid sent: synovial fluid        XR knee right: 1. Moderate right knee joint effusion increasing since 04/09/2018.  2. Moderate degenerative arthritis in the right knee.    XR knee left:  1. Unchanged since 04/09/2018.  2. Large left knee joint effusion.  3. Moderate degenerative arthritis in the left knee.    Abdomen 2 view  And chest 1 view:  1. Cardiomegaly. No pulmonary edema.  2. Mildly distended air-filled colon which may be due to ileus.    EKG:  Atrial-sensed ventricular-paced rhythm  ABNORMAL ECG    ED meds:  NS 500 cc bolus, dextrose 50% 25 cc bolus, tylenol 1000 mg po, rocephin 1 gram IV, vancomycin 1,500 mg IV, Solumedrol 40 mg IV, risaqaud po, sodium bicarb 1,300 mg po, tylenol 650 mg po, insulin lispro 1 units sq x 2, heparin 5,000 units sq    Patient admitted to Med/Surg Unit as OBS     Systemic inflammatory response syndrome   Acute gouty arthropathy   Possible cellulitis; unlikely septic arthritis   Congestive heart failure   Hypertension   Diabetes mellitus   Mitral regurgitation   Polymyalgia rheumatica   Chronic kidney disease   Status post pacemaker placement   Coronary artery disease    Recommendations:     I would like to suggest following approach:     Continue steroids   Rocephin 1 g IV daily   Correction of electrolytes   Blood cultures if spikes more than 100.5    Discussed with  family   CBC, CMP tomorrow    MED NOTES:  Assessment:Plan   Acute on chronic gout with septic arthritis  -S/p bedside arthrocentesis of both knees-we will follow-up on culture results  -Continue IV Rocephin  -Continue IV steroids -will taper as tolerated  - cont allopurinol home dose of allopurinol 100mg /d  -will need increase dose of allopurinol or to switch to uloricbut not during acute flareas can worsen flare-will need to be done outpt  -low purine diet  -pt to f/up with outpt rheumatology for further management    History of PMR    Insulin dependent diabetes type 2  -Continue Lantus and sliding scale insulin  -Hold linagliptin while in the hospital    Stage IV chronic kidney disease   -Creatinine at baseline  -Hold nephrotoxins    Renovascular hypertension  -Continue Toprol-XL  -Blood pressure stable    Mixed hyperlipidemia  -Continue Lipitor    Chronic systolic congestive heart failure ejection fraction of 35 to 40% per echo on 01/2018  -Continue Lasix, statins, aspirin and beta-blockers    History of heart block status post pacemaker placement in 2017    Grandson helped with translation.  Patient speaks Khmer only.  DVT Prohylaxis:heparin

## 2018-04-27 NOTE — Progress Notes (Signed)
[facility]ITAL[facility]  Progress Note  Patient Info:   Date/Time: 04/27/2018 / 6:03 PM   Admit Date:04/26/2018  Patient Name:Marco Bruce   YKZ:[id number]   PCP: Danella Sensing, MD  Attending Physician:Miel Wisener, Raeanne Barry, MD     Assessment and Plan:     Acute on chronic gout with cellulitis  -S/p bedside arthrocentesis of both knees on 04/26/2017-we will follow-up on culture results  -Continue IV Rocephin  -Stopped IV steroids and start on prednisone  -Cont home dose of allopurinol 100mg /d  -will need increase dose of allopurinol or to switch to uloricbut not during acute flareas can worsen flare-will need to be done outpt  -low purine diet  -pt to f/up with outpt rheumatology for further management    Gram-positive cocci bacteremia  -Most likely contaminant -follow-up on blood culture     History of PMR    Insulin dependent diabetes type 2  -Continue Lantus and sliding scale insulin  -Hold linagliptin while in the hospital    Stage IV chronic kidney disease   -Creatinine at baseline  -Hold nephrotoxins    Renovascular hypertension  -Continue Toprol-XL  -Blood pressure stable    Mixed hyperlipidemia  -Continue Lipitor    Chronic systolic congestive heart failure ejection fraction of 35 to 40% per echo on 01/2018  -Continue Lasix, statins, aspirin and beta-blockers    History of heart block status post pacemaker placement in 2017    Daughter helped with translation.  Patient speaks Khmer only.     Clarification of admission status: Patient is to be inpatient status due positive blood cultures and need to wait until we have the identification and sensitivities    Please review the H & P dated 04/26/2017 for the details on patient's PMH, PSH, Family history and social history.      DVT Prohylaxis:heparin   Central Line/Foley Catheter/PICC line status: none   Code Status: NO CPR  -  ALLOW NATURAL DEATH  Disposition:home  Type of Admission: inPatient  Estimated Length of Stay (including stay in the  ER receiving treatment): 1-2 days   Milestones required for discharge: Clinical improvement    Hospital Problems:   Active Problems:    Gout flare    Cellulitis    Subjective:   04/27/18 patient informs his pain is better controlled today , says the pain is 6/10 in severity.  No fever or chills    chief Complaint:  Knee Pain and Fever    ROS  Objective:     Vitals:    04/27/18 0822 04/27/18 0911 04/27/18 1244 04/27/18 1633   BP: 121/67  124/78 122/78   Pulse: 77  78 78   Resp:   16 16   Temp: 97 F (36.1 C) 97.5 F (36.4 C) 97.6 F (36.4 C) 97.4 F (36.3 C)   TempSrc:  Temporal Oral Oral   SpO2: 96% 97% 96% 98%   Weight:       Height:         Physical Exam:   Physical Exam   Constitutional: He is oriented to person, place, and time. No distress.   HENT:   Head: Normocephalic and atraumatic.   Cardiovascular: Normal rate and regular rhythm.   Pulmonary/Chest: Effort normal and breath sounds normal.   Abdominal: Soft. Bowel sounds are normal.   Musculoskeletal:         General: Tenderness (Bilateral knees) present. No edema.   Neurological: He is alert and oriented to person, place, and time.  Skin: Skin is warm and dry. He is not diaphoretic.   Nursing note and vitals reviewed.    Results of Labs/imaging   Labs and radiology reports have been reviewed.    Hospitalist   Signed by:   Sherre Scarlet  04/27/2018 6:03 PM    *This note was generated by the Epic EMR system/ Dragon speech recognition and may contain inherent errors or omissions not intended by the user. Grammatical errors, random word insertions, deletions, pronoun errors and incomplete sentences are occasional consequences of this technology due to software limitations. Not all errors are caught or corrected. If there are questions or concerns about the content of this note or information contained within the body of this dictation they should be addressed directly with the author for clarification

## 2018-04-27 NOTE — Plan of Care (Signed)
Problem: Compromised Tissue integrity  Goal: Damaged tissue is healing and protected  Outcome: Progressing  Flowsheets (Taken 04/27/2018 0031)  Damaged tissue is healing and protected : Monitor/assess Braden scale every shift; Increase activity as tolerated/progressive mobility; Reposition patient every 2 hours and as needed unless able to reposition self; Provide wound care per wound care algorithm; Relieve pressure to bony prominences for patients at moderate and high risk; Keep intact skin clean and dry; Use incontinence wipes for cleaning urine, stool and caustic drainage. Foley care as needed; Use bath wipes, not soap and water, for daily bathing; Avoid shearing injuries; Monitor external devices/tubes for correct placement to prevent pressure, friction and shearing; Encourage use of lotion/moisturizer on skin; Consult/collaborate with wound care nurse; Consider placing an indwelling catheter if incontinence interferes with healing of stage 3 or 4 pressure injury; Monitor patient's hygiene practices; Utilize specialty bed  Goal: Nutritional status is improving  Outcome: Progressing  Flowsheets (Taken 04/27/2018 0031)  Nutritional status is improving: Assist patient with eating; Encourage patient to take dietary supplement(s) as ordered; Include patient/patient care companion in decisions related to nutrition; Allow adequate time for meals     Problem: Safety  Goal: Patient will be free from injury during hospitalization  Outcome: Progressing  Flowsheets (Taken 04/27/2018 0031)  Patient will be free from injury during hospitalization : Assess patient's risk for falls and implement fall prevention plan of care per policy; Provide and maintain safe environment; Ensure appropriate safety devices are available at the bedside; Include patient/ family/ care giver in decisions related to safety; Assess for patients risk for elopement and implement [place] per policy; Provide alternative method of  communication if needed (communication boards, writing); Use appropriate transfer methods; Hourly rounding  Goal: Patient will be free from infection during hospitalization  Outcome: Progressing  Flowsheets (Taken 04/27/2018 0031)  Free from Infection during hospitalization: Assess and monitor for signs and symptoms of infection; Monitor all insertion sites (i.e. indwelling lines, tubes, urinary catheters, and drains); Encourage patient and family to use good hand hygiene technique; Monitor lab/diagnostic results     Problem: Pain  Goal: Pain at adequate level as identified by patient  Outcome: Progressing  Flowsheets (Taken 04/27/2018 0031)  Pain at adequate level as identified by patient: Identify patient comfort function goal; Assess for risk of opioid induced respiratory depression, including snoring/sleep apnea. Alert healthcare team of risk factors identified.; Assess pain on admission, during daily assessment and/or before any "as needed" intervention(s); Reassess pain within 30-60 minutes of any procedure/intervention, per Pain Assessment, Intervention, Reassessment (AIR) Cycle; Evaluate if patient comfort function goal is met; Evaluate patient's satisfaction with pain management progress; Offer non-pharmacological pain management interventions; Consult/collaborate with Physical Therapy, Occupational Therapy, and/or Speech Therapy     Problem: Side Effects from Pain Analgesia  Goal: Patient will experience minimal side effects of analgesic therapy  Outcome: Progressing  Flowsheets (Taken 04/27/2018 0031)  Patient will experience minimal side effects of analgesic therapy: Monitor/assess patient's respiratory status (RR depth, effort, breath sounds); Assess for changes in cognitive function; Prevent/manage side effects per LIP orders (i.e. nausea, vomiting, pruritus, constipation, urinary retention, etc.); Evaluate for opioid-induced sedation with appropriate assessment tool (i.e. POSS)

## 2018-04-27 NOTE — Progress Notes (Signed)
04/27/18 1729   Readmission Patient Interview/Contributing Factors   At discharge, discuss signs/symptoms? Yes   At discharge, discuss what to do for worsening of disease? Yes   At discharge, discuss who to contact? Yes   Asked if you understood instructions? Yes   D/C Instructions written, given to you? Yes   D/C Instructions easy to read? Yes   Post D/C Follow Up Made? No  (Per daughter, pt did not follow up w/ PCP as he was feeling OK. )   Contributing Factors to Readmission Non-compliant with discharge instructions   Patient active with Home Health? Yes, but did not receive services  ( )   Why did patient not receive services? Other (enter comment)  (Per [facility]'s note, they were unable to contact pt and daughter did not return calls)   Patient active with home hospice? No   Was patient readmitted from a facility? Not readmitted from a facility   Could admission have been avoided? Yes

## 2018-04-27 NOTE — Plan of Care (Signed)
Problem: Compromised Tissue integrity  Goal: Nutritional status is improving  Flowsheets (Taken 04/27/2018 1431)  Nutritional status is improving: Assist patient with eating; Allow adequate time for meals     Problem: Safety  Goal: Patient will be free from injury during hospitalization  Flowsheets (Taken 04/27/2018 0031 by Arvilla Meres, RN)  Patient will be free from injury during hospitalization : Assess patient's risk for falls and implement fall prevention plan of care per policy;Provide and maintain safe environment;Ensure appropriate safety devices are available at the bedside;Include patient/ family/ care giver in decisions related to safety;Assess for patients risk for elopement and implement [place] per policy;Provide alternative method of communication if needed (communication boards, writing);Use appropriate transfer methods;Hourly rounding     Problem: Pain  Goal: Pain at adequate level as identified by patient  Flowsheets (Taken 04/27/2018 1431)  Pain at adequate level as identified by patient: Identify patient comfort function goal; Assess for risk of opioid induced respiratory depression, including snoring/sleep apnea. Alert healthcare team of risk factors identified.; Assess pain on admission, during daily assessment and/or before any "as needed" intervention(s); Reassess pain within 30-60 minutes of any procedure/intervention, per Pain Assessment, Intervention, Reassessment (AIR) Cycle; Evaluate if patient comfort function goal is met

## 2018-04-27 NOTE — Progress Notes (Signed)
Infectious Disease            Progress Note    04/27/2018   Auther Lyerly SWH:[id number],ZLD:[id number] is a 85 y.o. male, history of congestive heart failure, coronary artery disease, chronic kidney disease, atrial fibrillation, status post pacemaker placement, hypertension, diabetes mellitus, anemia, polymyalgia rheumatica, gout admitted with acute gouty arthritis, cellulitis.    Subjective:     Marco Bruce today Symptoms: Afebrile, still complains of pain, wants to go home, one blood cultures came back positive for gram-positive cocci.No shortness of breath,cough, chest pain, chest pressure. Other review of system is non contributory.    Objective:     Blood pressure 121/67, pulse 77, temperature 97.5 F (36.4 C), temperature source Temporal, resp. rate 17, height 1.575 m (5' 2.01"), weight 68 kg (150 lb), SpO2 97 %.    General Appearance:In no acute distress  HEENT: Pallorpositive, Anicteric sclera.   Neck: Supple  Lungs:  Decreased breath sound at bases  Chest Wall:Symmetric chest wall expansion.   Heart :S1 and S2.   Abdomen:Abdomen is soft. There are no signs of ascites. Bowel sounds are normal. There is no abdominal tenderness. There is no mass. There is no splenomegaly or hepatomegaly.  Neurological:Sleepy and lethargic  Extremities:Multiple joint pains, inability to bend knees, especially the right; decreased range of motion     Laboratory And Diagnostic Studies:     Recent Labs     04/27/18  0639 04/26/18  0629   WBC 5.00 11.56*   Hgb 10.1* 12.5   Hematocrit 30.7* 38.3   Platelets 134* 199   Neutrophils  --  73.4     Recent Labs     04/27/18  0639 04/26/18  0629   Sodium 136 138   Potassium 4.7 4.7   Chloride 107 104   CO2 21* 23   BUN 53.5* 40.2*   Creatinine 2.4* 2.3*   Glucose 312* 36*   Calcium 8.5 9.9     Recent Labs     04/26/18  0629   AST (SGOT) 26   ALT 9   Alkaline Phosphatase 49   Protein, Total 6.7   Albumin 4.1   Bilirubin, Total 0.9       Current Med's:     Current  Facility-Administered Medications   Medication Dose Route Frequency    acetaminophen  650 mg Oral 4 times per day    allopurinol  100 mg Oral Daily    aspirin  81 mg Oral Daily    atorvastatin  40 mg Oral QHS    cefTRIAXone  1 g Intravenous Q24H    furosemide  40 mg Oral Daily    gabapentin  100 mg Oral Q8H    heparin (porcine)  5,000 Units Subcutaneous Q12H [place]    insulin glargine  15 Units Subcutaneous QHS    insulin lispro  1-8 Units Subcutaneous TID AC    lactobacillus/streptococcus  1 capsule Oral Daily    methylPREDNISolone  40 mg Intravenous Q6H    metoprolol succinate XL  12.5 mg Oral Daily    pantoprazole  40 mg Oral QAM AC    sodium bicarbonate  1,300 mg Oral BID       Lines/Drains:     Patient Lines/Drains/Airways Status    Active Lines, Drains and Airways     Name:   Placement date:   Placement time:   Site:   Days:    Peripheral IV 04/26/18 Left Antecubital   04/26/18    3903  Antecubital   1    Peripheral IV 04/26/18 Right Wrist   04/26/18    0637    Wrist   1                Assessment:      Condition: Guarded   Systemic inflammatory response syndrome   Acute gouty arthropathy   Bacteremia; most likely contaminant   Possible cellulitis; unlikely septic arthritis   Congestive heart failure   Hypertension   Diabetes mellitus   Mitral regurgitation   Polymyalgia rheumatica   Chronic kidney disease   Status post pacemaker placement   Coronary artery disease    Plan:      Continue Rocephin   Continue steroids   Will follow cultures   Continue supportive care   Discussed with family   Rheumatology follow-up as an outpatient   Discussed with Dr.Jadiga          Serafina Royals, M.D.,FACP  04/27/2018  9:44 AM          *This note was generated by the Epic EMR system/ Dragon speech recognition and may contain inherent errors or omissions not intended by the user. Grammatical errors, random word insertions, deletions, pronoun errors and incomplete sentences are occasional  consequences of this technology due to software limitations. Not all errors are caught or corrected. If there are questions or concerns about the content of this note or information contained within the body of this dictation they should be addressed directly with the author for clarification

## 2018-04-28 LAB — CBC
Absolute NRBC: 0 10*3/uL (ref 0.00–0.00)
Hematocrit: 28.7 % — ABNORMAL LOW (ref 37.6–49.6)
Hgb: 9.4 g/dL — ABNORMAL LOW (ref 12.5–17.1)
MCH: 29.1 pg (ref 25.1–33.5)
MCHC: 32.8 g/dL (ref 31.5–35.8)
MCV: 88.9 fL (ref 78.0–96.0)
MPV: 10.7 fL (ref 8.9–12.5)
Nucleated RBC: 0 /100 WBC (ref 0.0–0.0)
Platelets: 142 10*3/uL (ref 142–346)
RBC: 3.23 10*6/uL — ABNORMAL LOW (ref 4.20–5.90)
RDW: 17 % — ABNORMAL HIGH (ref 11–15)
WBC: 10.52 10*3/uL — ABNORMAL HIGH (ref 3.10–9.50)

## 2018-04-28 LAB — BASIC METABOLIC PANEL
Anion Gap: 8 (ref 5.0–15.0)
BUN: 58.1 mg/dL — ABNORMAL HIGH (ref 9.0–28.0)
CO2: 22 mEq/L (ref 22–29)
Calcium: 8.1 mg/dL (ref 7.9–10.2)
Chloride: 106 mEq/L (ref 100–111)
Creatinine: 2.1 mg/dL — ABNORMAL HIGH (ref 0.7–1.3)
Glucose: 259 mg/dL — ABNORMAL HIGH (ref 70–100)
Potassium: 4.1 mEq/L (ref 3.5–5.1)
Sodium: 136 mEq/L (ref 136–145)

## 2018-04-28 LAB — GFR: EGFR: 29.8

## 2018-04-28 LAB — GLUCOSE WHOLE BLOOD - POCT
Whole Blood Glucose POCT: 246 mg/dL — ABNORMAL HIGH (ref 70–100)
Whole Blood Glucose POCT: 317 mg/dL — ABNORMAL HIGH (ref 70–100)
Whole Blood Glucose POCT: 127 mg/dL — ABNORMAL HIGH (ref 70–100)

## 2018-04-28 MED ORDER — TRAMADOL HCL 50 MG PO TABS
50.00 mg | ORAL_TABLET | Freq: Two times a day (BID) | ORAL | 0 refills | Status: AC | PRN
Start: 2018-04-28 — End: 2018-05-05

## 2018-04-28 MED ORDER — RISAQUAD PO CAPS
1.00 | ORAL_CAPSULE | Freq: Every day | ORAL | Status: DC
Start: 2018-04-29 — End: 2018-12-28

## 2018-04-28 MED ORDER — CEPHALEXIN 250 MG PO CAPS
250.00 mg | ORAL_CAPSULE | Freq: Three times a day (TID) | ORAL | 0 refills | Status: AC
Start: 2018-04-29 — End: 2018-05-01

## 2018-04-28 MED ORDER — PREDNISONE 10 MG PO TABS
ORAL_TABLET | ORAL | 0 refills | Status: DC
Start: 2018-04-28 — End: 2018-06-08

## 2018-04-28 MED ORDER — ACETAMINOPHEN 325 MG PO TABS
650.0000 mg | ORAL_TABLET | Freq: Four times a day (QID) | ORAL | Status: DC
Start: 2018-04-28 — End: 2018-12-28

## 2018-04-28 NOTE — Progress Notes (Signed)
Patient discharged home with family and home PT/OT. All discharge teaching and instructions completed with patient and family members. Patient and family verbalized understanding of teaching. All new medications sent to home pharmacy and patient and family educated. Patient stable and denying pain at time of discharge. All personal belongings in hand. PIV d/c- CDI at time of d/c. All personal belongings in hand.

## 2018-04-28 NOTE — Progress Notes (Signed)
Home Health Referral      Reommendations are for SNF   Referral from [facility], [phone] for home health care upon discharge.            By [employer], the patient has the right to freely choose a home care provider.  Arrangements have been made with:     A company of the patients choosing. We have supplied the patient with a listing of providers in your area who asked to be included and participate in Medicare.   The preferred provider of your insurance company. Choosing a home care provider other than your insurance company's preferred provider may affect your insurance coverage.      Home Health Discharge Information     Your discharge planner is Norton Blizzard, [phone].  Your doctor has ordered Physical Therapy and Occupational Therapy in-home service(s) for you while you recuperate at home, to assist you in the transition from hospital to home.        The agency that you or your representative chose to provide the service:  Name of [place] Placement: [facility] [phone]          The above services were set up by:  Chaney Malling  ([place])   Phone      [phone]      Additional comments:   If you have not heard from your home health agency within 24-48 hours after discharge please call your agency to arrange a time for your first visit.  For any scheduling concerns or questions related to home health, such as time or date please contact your home health agency at the number listed above.     HOME HEALTH REFERRAL      RECS  ARE FOR SNF- PATIENT AND FAMILY DEClINED   PATIENT"S DEMOGRAPHICS:      Name: Marco Bruce    Discharge Address: [place] [address]      Primary Telephone Number:  [phone] son Marco Bruce Telephone Number: [phone] daughter, Marco Bruce  Emergency Contact and Number: Extended Emergency Contact Information  Primary Emergency Contact: Marco Bruce [place] Phone: [phone]  Work Phone:  [phone]  Mobile Phone: [phone]  Relation: Daughter  Secondary Emergency Contact: Marco Bruce [place] Phone: [phone]  Relation: [place]        Ordering Physician: Lorna Dibble, MD     Following Physician: same below     PCP: Danella Sensing, MD, [phone]    Agreeable to Follow: Yes  Date/Time of Call: 1/24 1200  Language/Communication Barrier:  Yes  - grandson interprets and is with the patient daily (vietnamese)    Primary Diagnosis and Reason for Services:  Pain, bilateral gout knees, ckd, DM with gen weakness        [employer] (Labs, Wounds, Infusions, etc.): na      Additional Comments:  NO SOC CALL IS NEEDED  RECS are for SNF        Discharge Date: 04/30/2018  Referral Source (PACC/Hospital/Unit): Chaney Malling, RN  Referral Date: 04/28/18     Home Health face-to-face (FTF) Encounter (Order [id number])   Consult   Date: 04/28/2018 Department: [facility] Ordering/Authorizing: Sherre Scarlet, MD   Order Information     Order Date/Time Release Date/Time Start Date/Time End Date/Time   04/28/18 12:01 PM None 04/28/18 11:56 AM 04/28/18 11:56 AM   Order Details  Frequency Duration Priority Order Class   Once 1 occurrence Routine Hospital Performed   Standing Order Information     Remaining Occurrences Interval Last Released      0/1 Once 04/28/2018            Provider Information     Ordering User Ordering Provider Authorizing Provider   Chaney Malling, RN Sherre Scarlet, MD Sherre Scarlet, MD   Attending Provider(s) Admitting Provider PCP   Cleotis Nipper, MD; Sherre Scarlet, MD Sherre Scarlet, MD Danella Sensing, MD   Verbal Order Info     Action Created on Order Mode Entered by Responsible Provider Signed by Signed on   Ordering 04/28/18 1201 Telephone with Kathryne Sharper, RN Sherre Scarlet, MD     Comments     Home PT/OT required for gait and balance training, strengthening, mobility, fall prevention, and  ADL training. Acute pain, gout bilateral knees, DM, CKD, gen weakness    Dr. Jerilynn Mages. [place], following in the community.         Order Questions     Question Answer Comment   Date of face-to-face (FTF) encounter: 04/28/2018    Medical conditions that necessitate Home Health care: B. Functional impairment due to recent hospitalization/procedure/treatment     C. Risk for complication/infection/pain requiring follow up and monitoring     D. Chronic illness & risk for re-hospitalization due to unstable disease status     F. New diagnosis & treatment requiring follow up monitoring and management    Clinical findings that support the need for Skilled Nursing. SN will: O. N/A    Clinical findings that support the need for Physical Therapy. PT will A. Evaluate and treat functional impairment and improve mobility    Clinical findings support the need for OT (needs SN/PT order).OT will A. Develop in home program to improve ability to perform ADLs    Clinical findings that support the need for SLP. ST will F. N/A    Per clinical findings, following services are medically necessary: PT     OT    Evidence this patient is homebound because: B. Profound weakness, poor balance/unsteady gait d/t illness/treatment/procedure     C. Decreased endurance, strength, ROM, cadence, safety/judgment during mobility     G. Fall risk due to impaired coordination, gait and decreased balance     I. Restricted to home to decrease risk of infection     J. Advanced age with frailty factors affecting safe ambulation & needs supervision          Process Instructions     Please select Home Care Services medically necessary.     Based on the above findings, I certify that this patient is confined to the home and needs intermittent skilled nursing care, physical therapry and / or speech therapy or continues to need occupational therapy. The patient is under my care, and I have initiated the establishment of the plan of care. This patient  will be followed by a physician who will periodically review the plan of care.    Collection Information     Consult Order Info     ID Description Priority Start Date Start Time   [id number] [place] face-to-face (FTF) Encounter Routine 04/28/2018 11:56 AM   Provider Specialty Referred to   ______________________________________ _____________________________________   Acknowledgement Info     For At Acknowledged By Acknowledged On   Placing Order 04/28/18 1201 Chaney Malling, RN 04/28/18 1201   Verbal Order  Info     Action Created on Order Mode Entered by Responsible Provider Signed by Signed on   Ordering 04/28/18 1201 Telephone with Kathryne Sharper, RN Sherre Scarlet, MD     Patient Information     Patient Name  Marco Bruce, Marco Bruce Sex  Male DOB  1926-12-04   Additional Information     Associated Reports External References   Priority and Order Details [facility]

## 2018-04-28 NOTE — Plan of Care (Signed)
Problem: Safety  Goal: Patient will be free from injury during hospitalization  Outcome: Progressing  Flowsheets (Taken 04/28/2018 0408)  Patient will be free from injury during hospitalization : Provide and maintain safe environment; Assess patient's risk for falls and implement fall prevention plan of care per policy; Ensure appropriate safety devices are available at the bedside; Use appropriate transfer methods; Include patient/ family/ care giver in decisions related to safety; Assess for patients risk for elopement and implement [place] per policy; Provide alternative method of communication if needed (communication boards, writing); Hourly rounding  Goal: Patient will be free from infection during hospitalization  Outcome: Progressing  Flowsheets (Taken 04/28/2018 0408)  Free from Infection during hospitalization: Assess and monitor for signs and symptoms of infection; Monitor all insertion sites (i.e. indwelling lines, tubes, urinary catheters, and drains); Encourage patient and family to use good hand hygiene technique; Monitor lab/diagnostic results     Problem: Pain  Goal: Pain at adequate level as identified by patient  Outcome: Progressing  Flowsheets (Taken 04/28/2018 0408)  Pain at adequate level as identified by patient: Identify patient comfort function goal; Assess for risk of opioid induced respiratory depression, including snoring/sleep apnea. Alert healthcare team of risk factors identified.; Assess pain on admission, during daily assessment and/or before any "as needed" intervention(s); Reassess pain within 30-60 minutes of any procedure/intervention, per Pain Assessment, Intervention, Reassessment (AIR) Cycle; Evaluate if patient comfort function goal is met; Evaluate patient's satisfaction with pain management progress; Offer non-pharmacological pain management interventions; Consult/collaborate with Physical Therapy, Occupational Therapy, and/or Speech Therapy; Include  patient/patient care companion in decisions related to pain management as needed; Consult/collaborate with Pain Service     Problem: Side Effects from Pain Analgesia  Goal: Patient will experience minimal side effects of analgesic therapy  Outcome: Progressing  Flowsheets (Taken 04/28/2018 0408)  Patient will experience minimal side effects of analgesic therapy: Monitor/assess patient's respiratory status (RR depth, effort, breath sounds); Assess for changes in cognitive function; Prevent/manage side effects per LIP orders (i.e. nausea, vomiting, pruritus, constipation, urinary retention, etc.); Evaluate for opioid-induced sedation with appropriate assessment tool (i.e. POSS)

## 2018-04-28 NOTE — Discharge Summary (Signed)
[facility] HOSPITALIST   [place] Summary   Patient Info:   Date/Time: 04/28/2018 / 3:00 PM   Admit Date:04/26/2018  Patient Name:Marco Bruce   LDJ:[id number]   PCP: Danella Sensing, MD  Attending Physician:Manpreet Strey, Raeanne Barry, MD     Hospital Course:   Please see H&P for complete details of HPI and ROS. The patient was admitted to [facility] and has been taken care as mentioned below.    Recurrent acuteonchronic gout with cellulitis  History of PMR  -S/p bedside arthrocentesis of both knees on 04/26/2017-synovial fluid cultures have been negative  -Initially treated with IV Rocephin -will discharge home on oral Keflex to complete 5 days coursediscussed with Dr. Su Ley  -Stopped IV steroids and started on prednisone taper  -Cont home dose of allopurinol 100mg /d  -will need increase dose of allopurinol or to switch to uloricbut not during acute flareas can worsen flare-will need to be done outpt  -low purine diet  -pt to f/up with outpt rheumatology for further management    Gram-positive cocci bacteremia -coagulase-negative staph  -Most likely contaminant     Insulin dependent diabetes type 2  -Continue Lantus and linagliptin    Stage IV chronic kidney disease  -Creatinine at baseline    Renovascular hypertension  -Continue Toprol-XL  -Blood pressure stable    Mixedhyperlipidemia  -Continue Lipitor    Chronic systolic congestive heart failure ejection fraction of 35 to 40% per echo on 01/2018  -Continue Lasix, statins, aspirin and beta-blockers    History of heart block status post pacemaker placement in 2017    Grandson helpedwith translation.Patient speaks Khmer only.    Disposition:home  Condition at Discharge and Prognosis: Stable now but guarded prognosis given multiple medical problems as mentioned above  Admission Date:04/26/2018  Discharge Date: 04/28/18  Type of Admission:Inpatient   Code Status: NO CPR  -  ALLOW NATURAL DEATH  Subjective at the time of discharge:    Patient informs the pain in his knees has significantly improved.  Denies any fever or chills.      Chief Complaint:  Knee Pain and Fever    Objective:     Vitals:    04/28/18 0100 04/28/18 0524 04/28/18 0713 04/28/18 1111   BP: 124/70 132/64 128/70 129/70   Pulse: 70 75 69 76   Resp: 16 16 14 16    Temp: 97.8 F (36.6 C) 97.5 F (36.4 C) 97.7 F (36.5 C) 97.5 F (36.4 C)   TempSrc: Temporal Oral Oral Oral   SpO2: 98% 98% 99% 99%   Weight:       Height:         Physical Exam:   Heart - S1, S2 heard.  No murmur  Lungs - bilateral air entry present, no wheeze or rales  Abdomen - soft, bowel sounds normal, nondistended, no tenderness  Central nervous system - no focal deficits  Musculoskeletal - no edema     Clinical Presentation:   History of Presenting Illness: Please refer to HPI in the Detailed H&P  Discharge Medications:   Discharge Medications:      Discharge Medication List      Taking    acetaminophen 325 MG tablet  Dose:  650 mg  Commonly known as:  TYLENOL  Take 2 tablets (650 mg total) by mouth every 6 (six) hours     allopurinol 100 MG tablet  Dose:  100 mg  Commonly known as:  ZYLOPRIM  Take 1 tablet (100 mg total) by  mouth daily     aspirin 81 MG chewable tablet  Dose:  81 mg  Chew 1 tablet (81 mg total) by mouth daily     atorvastatin 40 MG tablet  Dose:  40 mg  Commonly known as:  LIPITOR  Take 1 tablet (40 mg total) by mouth nightly     cephALEXin 250 MG capsule  Dose:  250 mg  Commonly known as:  KEFLEX  Start taking on:  April 29, 2018  Take 1 capsule (250 mg total) by mouth 3 (three) times daily for 2 days     furosemide 40 MG tablet  Dose:  40 mg  Commonly known as:  LASIX  Take 1 tablet (40 mg total) by mouth daily     gabapentin 100 MG capsule  Commonly known as:  NEURONTIN  TAKE ONE CAPSULE BY MOUTH EVERY 8 HOURS     insulin glargine 100 UNIT/ML injection pen  Dose:  25 Units  For:  Type 2 Diabetes  Inject 25 Units into the skin nightly     lactobacillus/streptococcus Caps  Dose:  1 capsule   Start taking on:  April 29, 2018  Take 1 capsule by mouth daily     linaGLIPtin 5 MG Tabs  Dose:  5 mg  Take 1 tablet (5 mg total) by mouth daily     metoprolol succinate XL 25 MG 24 hr tablet  Dose:  12.5 mg  Commonly known as:  TOPROL-XL  For:  Heart Failure, High Blood Pressure Disorder  Take 0.5 tablets (12.5 mg total) by mouth daily     pantoprazole 40 MG tablet  Dose:  40 mg  Commonly known as:  PROTONIX  Take 1 tablet (40 mg total) by mouth nightly     predniSONE 10 MG tablet  Commonly known as:  DELTASONE  Take 2 tabs daily for 3 days Take 1 tabs daily until seen by rheumatology     sodium bicarbonate 650 MG tablet  Dose:  1,300 mg  Take 2 tablets (1,300 mg total) by mouth 2 (two) times daily     traMADol 50 MG tablet  Dose:  50 mg  Commonly known as:  ULTRAM  Take 1 tablet (50 mg total) by mouth every 12 (twelve) hours as needed for Pain          Follow up recommendations:   Follow up:   Follow-up Information     [facility] In 1 day.    Why:  home health services   Contact information:  [place] [address]  [phone]           Danella Sensing, MD .    Specialty:  Family Medicine  Contact information:  [address]  [place] [address]  [phone]                  Results of Labs/imaging:   Labs have been reviewed:   Coagulation Profile:   Recent Labs   Lab 04/26/18  0629   PT 14.1   PT INR 1.1   PTT 43*       CBC review:   Recent Labs   Lab 04/28/18  0659 04/27/18  0639 04/26/18  0629   WBC 10.52* 5.00 11.56*   Hgb 9.4* 10.1* 12.5   Hematocrit 28.7* 30.7* 38.3   Platelets 142 134* 199   MCV 88.9 91.1 91.2   RDW 17* 17* 17*   Neutrophils  --   --  73.4   Lymphocytes Automated  --   --  13.8   Eosinophils Automated  --   --  0.1   Immature Granulocyte  --   --  0.6   Neutrophils Absolute  --   --  8.49*   Absolute Immature Granulocyte  --   --  0.07     Chem Review:  Recent Labs   Lab 04/28/18  0659 04/27/18  0639 04/26/18  0629   Sodium 136  136 138   Potassium 4.1 4.7 4.7   Chloride 106 107 104   CO2 22 21* 23   BUN 58.1* 53.5* 40.2*   Creatinine 2.1* 2.4* 2.3*   Glucose 259* 312* 36*   Calcium 8.1 8.5 9.9   Magnesium  --   --  1.8   Bilirubin, Total  --   --  0.9   AST (SGOT)  --   --  26   ALT  --   --  9   Alkaline Phosphatase  --   --  49     Results     Procedure Component Value Units Date/Time    Throat Culture [[id number]] Collected:  04/26/18 0629    Specimen:  Throat Updated:  04/28/18 1144    Narrative:       ORDER#: [id number]                                    ORDERED BY: Cleotis Nipper  SOURCE: Throat                                       COLLECTED:  04/26/18 06:29  ANTIBIOTICS AT COLL.:                                RECEIVED :  04/26/18 10:30  Throat Culture                             FINAL       04/28/18 11:44  04/28/18   No Beta Hemolytic Streptococcus Group A, C, or G isolated,             no further work.      Glucose Whole Blood - POCT [[id number]]  (Abnormal) Collected:  04/28/18 1107     Updated:  04/28/18 1122     POCT - Glucose Whole blood 317 mg/dL     Blood Culture Aerobic/Anaerobic #2 [[id number]] Collected:  04/26/18 0740    Specimen:  Arm from Blood, Venipuncture Updated:  04/28/18 1121    Narrative:       ORDER#: [id number]                                    ORDERED BY: Cleotis Nipper  SOURCE: Blood, Venipuncture R Wrist                  COLLECTED:  04/26/18 07:40  ANTIBIOTICS AT COLL.:                                RECEIVED :  04/26/18 10:22  Culture Blood Aerobic and Anaerobic        PRELIM      04/28/18 11:21  04/27/18   No Growth after 1 day/s of incubation.  04/28/18   No Growth after 2 day/s of incubation.      Blood Culture Aerobic/Anaerobic #1 [[id number]] Collected:  04/26/18 0740    Specimen:  Arm from Blood, Venipuncture Updated:  04/28/18 1101    Narrative:       Gram stain Results called to [id number] by [id number]     .  Readback confirmed, by  [id number] on 04/27/2018 at 10:54  ORDER#: [id number]                                     ORDERED BY: Cleotis Nipper  SOURCE: Blood, Venipuncture L AC                     COLLECTED:  04/26/18 07:40  ANTIBIOTICS AT COLL.:                                RECEIVED :  04/26/18 10:22  Gram stain Results called to [id number] by [id number]     .  Readback confirmed, by [id number] on 04/27/2018 at 10:54  Culture Blood Aerobic and Anaerobic        PRELIM      04/28/18 11:01   +  04/27/18   Anaerobic Blood Culture Positive in less than 24 hrs             Gram Stain Shows: Gram positive cocci in clusters             Resembling Staphylococcus species             Further workup to follow including susceptibility testing  04/28/18   Aerobic culture no growth to date, final report to follow  04/28/18   Growth of Staphylococcus (coagulase negative)               Possible skin contaminant, susceptibility testing not             performed without request unless additional blood cultures,             collected within 48 hours of this culture, become positive.             Contact the laboratory for further information if necessary.        CBC without differential [[id number]]  (Abnormal) Collected:  04/28/18 0659    Specimen:  Blood Updated:  04/28/18 0758     WBC 10.52 x10 3/uL      Hgb 9.4 g/dL      Hematocrit 28.7 %      Platelets 142 x10 3/uL      RBC 3.23 x10 6/uL      MCV 88.9 fL      MCH 29.1 pg      MCHC 32.8 g/dL      RDW 17 %      MPV 10.7 fL      Nucleated RBC 0.0 /100 WBC      Absolute NRBC 0.00 x10 3/uL     Basic Metabolic Panel [[id number]]  (Abnormal) Collected:  04/28/18 0659    Specimen:  Blood Updated:  04/28/18 0758     Glucose 259 mg/dL      BUN 58.1 mg/dL  Creatinine 2.1 mg/dL      Calcium 8.1 mg/dL      Sodium 136 mEq/L      Potassium 4.1 mEq/L      Chloride 106 mEq/L      CO2 22 mEq/L      Anion Gap 8.0    GFR [[id number]] Collected:  04/28/18 0659     Updated:  04/28/18 0758     EGFR 29.8    Glucose Whole Blood - POCT [[id number]]  (Abnormal) Collected:  04/28/18 0710     Updated:  04/28/18 0730     POCT -  Glucose Whole blood 246 mg/dL     Glucose Whole Blood - POCT [[id number]]  (Abnormal) Collected:  04/27/18 2153     Updated:  04/27/18 2159     POCT - Glucose Whole blood 289 mg/dL     Glucose Whole Blood - POCT [[id number]]  (Abnormal) Collected:  04/27/18 1626     Updated:  04/27/18 1629     POCT - Glucose Whole blood 217 mg/dL         Radiology reports have been reviewed:  Radiology Results (24 Hour)     ** No results found for the last 24 hours. **        Xr Knee 1 Or 2 Views Left    Result Date: 04/26/2018  History: Pain. FINDINGS: AP and lateral views were obtained and are compared to 04/09/2018. Again noted is a large left knee joint effusion. There is no evidence of a fracture or bony erosion. There is moderate degenerative arthritis with joint space narrowing and marginal osteophyte formation. Extensive vascular calcifications are present.     1. Unchanged since 04/09/2018. 2. Large left knee joint effusion. 3. Moderate degenerative arthritis in the left knee. Marjean Donna, MD 04/26/2018 8:48 AM    Xr Knee 1 Or 2 Views Left    Result Date: 04/09/2018  TECHNIQUE: AP and lateral views of the left knee. INDICATION: effusion, increased pain COMPARISON: 01/07/2018. FINDINGS: Moderate tricompartmental degenerative changes, appearing worse in the medial compartment compared to prior but the projection is different. No visualized fracture or dislocation. Redemonstrated moderate to large joint effusion. Extensive vascular calcifications.      1. Moderate check for metal degenerative changes. Apparent worsening of the medial compartment joint space loss may be in part projectional. 2. Demonstrated moderate to large joint effusion. Lytle Michaels, MD 04/09/2018 6:09 AM    Xr Knee 1 Or 2 Views Right    Result Date: 04/26/2018  History: Pain. FINDINGS: AP and lateral views were obtained and are compared to 04/09/2018. There is a moderate right knee joint effusion which has increased since the last exam. There is no evidence of  a fracture or bony erosion. There is moderate degenerative arthritis with joint space narrowing and osteophyte formation. Vascular calcifications are present.     1. Moderate right knee joint effusion increasing since 04/09/2018. 2. Moderate degenerative arthritis in the right knee. Marjean Donna, MD 04/26/2018 8:49 AM    Xr Knee 1 Or 2 Views Right    Result Date: 04/09/2018  TECHNIQUE: AP and lateral views of the right knee INDICATION: increased swelling, pain COMPARISON: 01/07/2018. FINDINGS: Tricompartmental degenerative changes, worst in the medial compartment where it is moderate to severe. No visualized fracture or dislocation. Redemonstrated moderate joint effusion. Extensive atherosclerotic calcifications.      Moderate to severe degenerative changes. Persistent moderate joint effusion. Lytle Michaels, MD 04/09/2018 6:11 AM  Ct Head Wo Contrast    Result Date: 04/08/2018  HISTORY: Left facial droop. COMPARISON: None. TECHNIQUE: Axial noncontrast imaging through the head was performed. This CT study was performed using radiation dose reduction techniques including one or more of the following: automated exposure control, adjustment of the mA and/or kV according to patient size, and the use of iterative reconstruction technique. FINDINGS: There is mild-moderate diffuse parenchymal volume loss with compensatory enlargement of the sulci and ventricles. There is mild supratentorial chronic small vessel ischemic disease. No acute infarct is noted. No intracranial hemorrhage is seen. The paranasal sinuses and mastoid air cells appear clear. Internal carotid and vertebral artery calcifications are visualized.      No acute intracranial abnormality is seen.     [name], MD 04/08/2018 9:52 PM    Ct Chest Without Contrast    Result Date: 04/09/2018  TECHNIQUE: CT of the chest WITHOUT intravenous contrast. The following dose reduction techniques were utilized: Automated exposure control and/or adjustment of the mA  and/or kV according to patient size, and the use of iterative reconstruction technique. INDICATION: Shortness of breath, cough, hypoxia. COMPARISON: No relevant prior examination available for comparison. FINDINGS: LINES/TUBES: None. LUNGS: No consolidation, edema or mass. PLEURA: No pleural effusions or pneumothorax. HEART: Cardiomegaly. Coronary artery calcifications. No pericardial effusion. MEDIASTINUM:  Calcified right hilar lymph node. Multiple subcentimeter mediastinal lymph nodes, nonspecific. PULMONARY ARTERIES: Enlarged pulmonary artery measuring 3.9 cm suggestive of pulmonary hypertension. AORTA:  Aortic atherosclerosis. No aneurysm. UPPER ABDOMEN:  Prior cholecystectomy. BONES AND SOFT TISSUES:  Unremarkable.     No acute abnormality. Nonacute findings detailed above. Loyal Buba, MD 04/09/2018 1:26 AM    Xr Chest  Ap Portable    Result Date: 04/08/2018  HISTORY:  Nausea and vomiting. COMPARISON:  01/07/2018 AP CHEST: Left dual-lead pacemaker is unchanged. Post cholecystectomy. The heart is enlarged.  The pulmonary vascular pattern is unremarkable. The lungs are clear. There is no effusion. There is no pneumothorax. Stable elevation of the right hemidiaphragm is noted.  Bony structures are unremarkable.       Cardiomegaly. Virl Diamond, MD 04/08/2018 8:47 PM    Xr Pelvis Limited 1 Or 2 Views    Result Date: 04/09/2018  TECHNIQUE: AP view of the pelvis INDICATION: Pain COMPARISON: 12/01/2017 FINDINGS: No acute fracture or dislocation.          No acute abnormality. Loyal Buba, MD 04/09/2018 1:48 AM    US Venous Duplex Doppler Leg Bilateral    Result Date: 04/09/2018  TECHNIQUE: Venous Doppler of the BILATERAL lower extremities INDICATION: Pain, swelling, leg DVT suspected. COMPARISON:  No relevant prior examination available for comparison. FINDINGS: Mildly limited evaluation of the calf veins. RIGHT: COMMON FEMORAL VEIN:  Compressible, with normal color and spectral Doppler. FEMORAL VEIN PROXIMAL:   Compressible, with normal color and spectral Doppler. FEMORAL VEIN MID:  Compressible, with normal color and spectral Doppler. FEMORAL VEIN DISTAL:  Compressible, with normal color and spectral Doppler. POPLITEAL VEIN:  Compressible, with normal color and spectral Doppler. POSTERIOR TIBIAL/PERONEAL VEINS:  Visualized calf veins are patent. OTHER:  Atherosclerotic calcifications. LEFT: COMMON FEMORAL VEIN:  Compressible, with normal color and spectral Doppler. FEMORAL VEIN PROXIMAL:  Compressible, with normal color and spectral Doppler. FEMORAL VEIN MID:  Compressible, with normal color and spectral Doppler. FEMORAL VEIN DISTAL:  Compressible, with normal color and spectral Doppler. POPLITEAL VEIN:  Compressible, with normal color and spectral Doppler. POSTERIOR TIBIAL/PERONEAL VEINS:  Visualized calf veins are patent. OTHER:  Atherosclerotic calcifications.      No deep venous thrombosis of the BILATERAL lower extremities. Lytle Michaels, MD 04/09/2018 2:46 AM    Abdomen 2 View With Chest 1 View    Result Date: 04/26/2018  AP upright view of the chest Upright and supine views of the abdomen CLINICAL INDICATION: Vomiting. Comparison 04/08/2018. FINDINGS: Pacemaker leads overlie the right ventricle and right atrium. The heart is enlarged. Aorta is uncoiled.. Lungs are clear with normal pulmonary vascularity. No pleural effusion, hilar or mediastinal prominence is evident. No free intraperitoneal air is seen. Colon is mildly distended with air. No air-fluid levels are seen. No dilated small bowel loops are seen. Patient is status post cholecystectomy. Atherosclerotic plaque in the abdominal aorta and bilateral common iliac arteries.     1. Cardiomegaly. No pulmonary edema. 2. Mildly distended air-filled colon which may be due to ileus. Ivin Booty  D'Heureux, MD 04/26/2018 8:48 AM    Pathology:   Specimens (From admission, onward)    None        Pending Lab Results:   Labs/Images to be followed at your PCP office:   Unresulted  Labs     None        Hospitalist:   Signed by: Sherre Scarlet  04/28/2018 3:00 PM  Time spent for discharge: 50 minutes      *This note was generated by the Epic EMR system/ Dragon speech recognition and may contain inherent errors or omissions not intended by the user. Grammatical errors, random word insertions, deletions, pronoun errors and incomplete sentences are occasional consequences of this technology due to software limitations. Not all errors are caught or corrected. If there are questions or concerns about the content of this note or information contained within the body of this dictation they should be addressed directly with the author for clarification

## 2018-04-28 NOTE — Plan of Care (Signed)
Patient and family educated on low purine diet as per MD order. Handout given to patient's family member. Patient and family verbalize understanding of teaching.

## 2018-04-28 NOTE — Plan of Care (Signed)
Family is at the bedside and patient requests family translate. Medications given as ordered. PT/OT today. Patient medicated with scheduled tylenol and tramadol PRN prior to PT. Patient c/o pain level 8/10 during PT while applying weight to legs. Will continue to monitor.

## 2018-04-28 NOTE — Progress Notes (Signed)
Infectious Disease            Progress Note    04/28/2018   Marco Bruce CYO:[id number],UEB:[id number] is a 85 y.o. male, history of congestive heart failure, coronary artery disease, chronic kidney disease, atrial fibrillation, status post pacemaker placement, hypertension, diabetes mellitus, anemia, polymyalgia rheumatica, gout admitted with acute gouty arthritis, cellulitis.    Subjective:     [name] today Symptoms: Afebrile.  Denies any new complaints.  No shortness of breath,cough, chest pain, chest pressure. Other review of system is non contributory.    Objective:     Blood pressure 128/70, pulse 69, temperature 97.7 F (36.5 C), temperature source Oral, resp. rate 14, height 1.575 m (5' 2.01"), weight 68 kg (150 lb), SpO2 99 %.    General Appearance:In no acute distress; looks comfortable  HEENT: Pallorpositive, Anicteric sclera.   Neck: Supple  Lungs:  Decreased breath sound at bases  Chest Wall:Symmetric chest wall expansion.   Heart :S1 and S2.   Abdomen:Abdomen is soft. There are no signs of ascites. Bowel sounds are normal. There is no abdominal tenderness. There is no mass. There is no splenomegaly or hepatomegaly.  Neurological:Sleepy and lethargic  Extremities:Multiple joint pains     Laboratory And Diagnostic Studies:     Recent Labs     04/28/18  0659 04/27/18  0639 04/26/18  0629   WBC 10.52* 5.00 11.56*   Hgb 9.4* 10.1* 12.5   Hematocrit 28.7* 30.7* 38.3   Platelets 142 134* 199   Neutrophils  --   --  73.4     Recent Labs     04/28/18  0659 04/27/18  0639   Sodium 136 136   Potassium 4.1 4.7   Chloride 106 107   CO2 22 21*   BUN 58.1* 53.5*   Creatinine 2.1* 2.4*   Glucose 259* 312*   Calcium 8.1 8.5     Recent Labs     04/26/18  0629   AST (SGOT) 26   ALT 9   Alkaline Phosphatase 49   Protein, Total 6.7   Albumin 4.1   Bilirubin, Total 0.9       Current Med's:     Current Facility-Administered Medications   Medication Dose Route Frequency    acetaminophen  650 mg Oral 4  times per day    allopurinol  100 mg Oral Daily    aspirin  81 mg Oral Daily    atorvastatin  40 mg Oral QHS    cefTRIAXone  1 g Intravenous Q24H    furosemide  40 mg Oral Daily    gabapentin  100 mg Oral Q8H    heparin (porcine)  5,000 Units Subcutaneous Q12H [place]    insulin glargine  15 Units Subcutaneous QHS    insulin lispro  1-8 Units Subcutaneous TID AC    lactobacillus/streptococcus  1 capsule Oral Daily    metoprolol succinate XL  12.5 mg Oral Daily    pantoprazole  40 mg Oral QAM AC    predniSONE  20 mg Oral QAM W/BREAKFAST    sodium bicarbonate  1,300 mg Oral BID       Lines/Drains:     Patient Lines/Drains/Airways Status    Active Lines, Drains and Airways     Name:   Placement date:   Placement time:   Site:   Days:    Peripheral IV 04/26/18 Left Antecubital   04/26/18    0621    Antecubital   2  Peripheral IV 04/26/18 Right Wrist   04/26/18    0637    Wrist   2                Assessment:      Condition: Guarded   Systemic inflammatory response syndrome   Acute gouty arthropathy   Bacteremia; most likely contaminant   Possible cellulitis; unlikely septic arthritis   Congestive heart failure   Hypertension   Diabetes mellitus   Mitral regurgitation   Polymyalgia rheumatica   Chronic kidney disease   Status post pacemaker placement   Coronary artery disease    Plan:      Continue Rocephin   Continue steroids   Continue supportive care   Discussed with family   Rheumatology follow-up as an outpatient   Discussed with Dr.Jadiga   Discussed with family   Can be discharged from ID perspective with close monitoring          [name] A Berklee Battey, M.D.,FACP  04/28/2018  9:30 AM          *This note was generated by the Epic EMR system/ Dragon speech recognition and may contain inherent errors or omissions not intended by the user. Grammatical errors, random word insertions, deletions, pronoun errors and incomplete sentences are occasional consequences of this technology due to  software limitations. Not all errors are caught or corrected. If there are questions or concerns about the content of this note or information contained within the body of this dictation they should be addressed directly with the author for clarification

## 2018-04-28 NOTE — OT Eval Note (Addendum)
[facility]  [place]  [phone]    Occupational Therapy Evaluation    Patient: Marco Bruce    MRN#: [id number]     [id number]    Time of treatment: Time Calculation  OT Received On: 04/28/18  Start Time: 0815  Stop Time: 0856  Time Calculation (min): 41 min  OT Visit Number: 1    Consult received for [name] for OT Evaluation and Treatment.  Patients medical condition is appropriate for Occupational therapy intervention at this time.    Assessment:   Mate Alegria is a 85 y.o. male admitted 04/26/2018.   Brief chart review completed including review of labs, review of imaging, review of vitals and review of H&P and physician progress notes.  Pt's ability to complete ADLs and functional transfers is impaired due to the following deficits:  decreased activity tolerance, decreased balance, decreased bed mobility, edema, gait impairment, pain, decreased strength and transfers .  Pt demonstrates performance deficits with grooming, bathing, dressing, toileting and functional mobility. There are a few comorbidities or other factors that affect plan of care and require modification of task including: assistive device needed for mobility, has stairs to manage and see pmh.  Pt would continue to benefit from OT to address these deficits and increase functional independence.    Assessment: decreased strength;balance deficits;decreased independence with ADLs;decreased independence with IADLs;decreased endurance/activity tolerance     Complexity Chart Review Performance Deficits Clinical Decision Making Hx/Comorbidities Assistance needed   Low Brief 1-3 Limited options None None (or at baseline)   Moderate Expanded 3-5 Several Options 1-2 Min/Mod assist (not at baseline)   High Extensive 5 or more Multiple options 3 or more Max/dependent (not at baseline     Therapy Diagnosis: decreased independence with ADL's due to knee pain and fever . Without  therapy interventions, patient is at risk for failure to return to PLOF.    Rehabilitation Potential: Prognosis: Good;With continued OT s/p acute discharge      Plan:   OT Frequency Recommended: 2-3x/wk   Treatment Interventions: ADL retraining;Functional transfer training;UE strengthening/ROM;Patient/Family training;Equipment eval/education     Patient Goal  Patient Goal: Grandson in room reports goal to take patient home with family assist.     Risks/Benefits/POC Discussed with Pt/Family: With patient    Goals:   Goal Formulation: Patient  Time For Goal Achievement: by time of discharge  ADL Goals  Patient will groom self: Modified Independent;at sinkside;3 visits  Patient will dress lower body: Modified Independent;5 visits  Patient will toilet: Modified Independent;5 visits  Mobility and Transfer Goals  Pt will transfer bed to toilet: Modified Independent;with rolling walker;5 visits                         Discharge Recommendations:   Based on today's session patient's discharge recommendation is the following: Discharge Recommendation: Home with Home health, Home with supervision        If Discharge Recommendation: Home with Home health, Home with supervision is not available, then the patient will need assistance with mobility, assistance with ADL's and assistance with IADL's.        Precautions and Contraindications:   Precautions  Other Precautions: Fall precautions      Medical Diagnosis: Hypoglycemia [E16.2]  Fever in adult [R50.9]  Gout [M10.9]  Chronic pain of both knees [M25.561, M25.562, G89.29]  Chronic kidney disease, unspecified CKD stage [N18.9]  History of Present Illness: Marco Bruce is a 84 y.o. male admitted on 04/26/2018 with "knee pain and fever." per h and p.     Patient Active Problem List   Diagnosis    CKD (chronic kidney disease), stage IV    Essential hypertension    T2DM (type 2 diabetes mellitus)    Pacemaker    Chronic pain    Acute gout of knees    Gout flare    Cellulitis         Past Medical/Surgical History:  Past Medical History:   Diagnosis Date    Acute diastolic heart failure 15/37/9432    Acute on chronic diastolic congestive heart failure 08/13/2016    Acute pulmonary edema 05/26/2015    Chronic diastolic heart failure 76/14/7092    Chronic gout     Chronic pain syndrome 07/07/2015    CKD (chronic kidney disease) stage 4, GFR 15-29 ml/min     2018 baseline creatinine in EPIC is 1.5-2.3.    Congestive heart failure (CHF)     EF 40% 5/18 echo, on lasix    Current chronic use of systemic steroids     For Polymyalgia rheumatica    Essential hypertension     Mixed hyperlipidemia     NSTEMI (non-ST elevated myocardial infarction) 08/13/2016    Osteoarthritis of both knees     Peripheral edema 06/16/2015    Pneumonia due to infectious organism, unspecified laterality, unspecified part of lung 06/25/2015    Polymyalgia rheumatica     chronic steroids    Renovascular hypertension 08/04/2015    Second degree heart block by electrocardiogram (ECG) 04/27/2015    Type 2 diabetes mellitus with complication, with long-term current use of insulin     12/01/2016 Hemoglobin A1c 8.2%      Past Surgical History:   Procedure Laterality Date    ABDOMINAL SURGERY      CHOLECYSTECTOMY      PACEMAKER Left 2017         X-Rays/Tests/Labs:  Xr Knee 1 Or 2 Views Left    Result Date: 04/26/2018  1. Unchanged since 04/09/2018. 2. Large left knee joint effusion. 3. Moderate degenerative arthritis in the left knee. Marjean Donna, MD 04/26/2018 8:48 AM    Xr Knee 1 Or 2 Views Left    Result Date: 04/09/2018   1. Moderate check for metal degenerative changes. Apparent worsening of the medial compartment joint space loss may be in part projectional. 2. Demonstrated moderate to large joint effusion. Lytle Michaels, MD 04/09/2018 6:09 AM    Xr Knee 1 Or 2 Views Right    Result Date: 04/26/2018  1. Moderate right knee joint effusion increasing since 04/09/2018. 2. Moderate degenerative arthritis in the  right knee. Marjean Donna, MD 04/26/2018 8:49 AM    Xr Knee 1 Or 2 Views Right    Result Date: 04/09/2018   Moderate to severe degenerative changes. Persistent moderate joint effusion. Lytle Michaels, MD 04/09/2018 6:11 AM    Abdomen 2 View With Chest 1 View    Result Date: 04/26/2018  1. Cardiomegaly. No pulmonary edema. 2. Mildly distended air-filled colon which may be due to ileus. Ivin Booty  D'Heureux, MD 04/26/2018 8:48 AM  -per radiology reports.     Social History:  Prior Level of Function  Prior level of function: Ambulates with assistive device, Needs assistance with ADLs(Needs one person assistance)  Driving: No  Cooking: No  DME Currently at Home: Wheelchair, Manual, Environmental consultant, [employer]  Wheel, ADLChief Technology Officer    Home Living Arrangements  Living Arrangements: Children, Family members(Pt lives w/ daughter and grandson)  Home Layout: Two level, Able to live on main level with bedroom/bathroom(13 STE)  Bathroom Shower/Tub: Tub shower, grab bars, shower chair  DME Currently at Home: Wheelchair, Manual, Environmental consultant, [employer], ADL- Shower Chair      Subjective:   Patient is agreeable to participation in the therapy session. Nursing clears patient for therapy.  Subjective: Patient reports c/o pain 7/10 R knee.   Pain Assessment  Pain Assessment: Numeric Scale (0-10)  Pain Score: 7-severe pain  POSS Score: Awake and Alert  Pain Location: Knee  Pain Orientation: Left;Right  Pain Intervention(s): Heat applied;Elevated(Nursing notified).        Objective:   Observation of Patient/Vital Signs:  Patient is in bed with no medical equipment, heat packs R knee in place.         Cognition/Neuro Status  Arousal/Alertness: Appropriate responses to stimuli  Attention Span: Appears intact  Orientation Level: Oriented X4  Memory: Appears intact  Following Commands: independent  Safety Awareness: minimal verbal instruction  Insights: Educated in safety awareness  Problem Solving: Assistance required to identify errors made  Behavior:  attentive;calm;cooperative  Motor Planning: decreased processing speed(? due to c/o pain in B knees)  Coordination: [place] impaired  Hand Dominance: right handed    Gross ROM  Right Upper Extremity ROM: within functional limits  Left Upper Extremity ROM: within functional limits  Gross Strength  Right Upper Extremity Strength: within functional limits  Left Upper Extremity Strength: within functional limits     Tone: within functional limits    Sensory  Auditory: intact  Tactile - Light Touch: intact  Visual Acuity: intact(reading glasses only)       Self-care and Home Management  Eating: Independent;at edge of bed;Beverage management  Grooming: Stand by Assist;standing at sink;standing with assistive device;wash/dry hands  UB Dressing: Independent  LB Dressing: Supervision;sitting;edge of bed;Don/doff R sock;Don/doff L sock  Toileting: Supervision  Functional Transfers: Minimal Assist w/ RW, very slow pace.    Mobility and Transfers  Scooting to EOB: Supervision;Increased Time;Increased Effort  Supine to Sit: Supervision;Increased Time;Increased Effort;using bedrail;HOB raised; c/o dizziness upon initial supine to sit and was educated in pursed lip breathing.   Sit to Stand: Minimal Assist;Increased Time;Increased Effort;with instruction for hand placement to increase safety.  Functional Mobility/Ambulation: Supervision; to ambulate from bed<--->toilet/sink using RW, very slow pace, c/o pain in B knees 7/10.     Balance  Static Sitting Balance: Independent  Dyanamic Sitting Balance: Independent  Static Standing Balance: Minimal Assist with RW  Dynamic Standing Balance: Minimal Assist with RW    Participation and Endurance  Participation Effort: good    AM-PAC "6 Clicks" Daily Activity Inpatient Short Form  Inpatient AM-PAC Performed?: yes  Put On/Take Off Lower Body Clothing: A little  Assist with Bathing: A little  Assist with Toileting: A little  Put On/Take Off Upper Body Clothing: None  Assist with Grooming: A  little  Assist with Eating: None  OT Daily Activity Raw Score: 20  CMS 0-100% Score: 38.32%    PMP - Progressive Mobility Protocol   PMP Activity: Step 6 - Walks in Room  Distance Walked (ft) (Step 6,7): 10 Feet    Treatment Activities:     Educated the patient to role of occupational therapy, plan of care, goals of therapy and HEP, safety with mobility and ADLs, energy conservation techniques, pursed lip breathing, home safety.  Patient was seen for OT evaluation and followed with participation of the above listed therapeutic activities. Verbal instruction provided for pt to pace self during functional mobility and ADLs for increased safety and reduced fall risk, ie: to move slowly and to sit for a moment after transitional movements before starting to move in case of dizziness and to allow time to make sure equipment is in correct/safe position, and to scan environment for any obstacles. Verbal and visual instruction provided for correct/safe placement of hands and walker during functional activities to reduce fall risks.  Verbally instructed pt to perform the following UB therex intermittently t/o the day in order to increase patient's strength and endurance for ADL performance:10 reps of B/L shoulder flex/ext, elbow flex/ext, and digit flex/ext of B hands using thera band and HEP provided. Verbally instructed pt in seated performance of all ADLs and benefit from supervision with all mobility/activities upon initial d/c home. Patient  left seated in bedside chair w/ call-bell and phone w/in reach.  Verbally instructed pt in calling for nsg w/ any OOB needs. Patient receptive to the recommendations and verbalized understanding of the same. Grandsons in room visiting. Nursing notified of session outcomes.       Cristi Loron OTR/L, [facility]  Physical Medicine and Rehabilitation Dept  Pager #: [phone]

## 2018-04-28 NOTE — PT Eval Note (Signed)
[facility]  [place], [place]    Department of Rehabilitation  [phone]    Physical Therapy Evaluation    Patient: Marco Bruce    MRN#: [id number]     [id number]/[id number]    Time of treatment: Time Calculation  PT Received On: 04/28/18  Start Time: 1215  Stop Time: 1235  Time Calculation (min): 20 min    PT Visit Number: 1    Consult received for [name] for PT Evaluation and Treatment.  Patients medical condition is appropriate for Physical therapy intervention at this time.      Assessment:   Marco Bruce is a 85 y.o. male admitted 04/26/2018.  Pt's functional mobility is impacted by:  decreased activity tolerance, decreased balance, gait impairment, pain, decreased strength and transfers .  There are a few comorbidities or other factors that affect plan of care and require modification of task including: assistive device needed for mobility, chronic pain and has stairs to manage.  Standardized tests and exams incorporated into evaluation include AMPAC mobility.  Pt demonstrates a evolving clinical presentation due to inc in pain with activity impacting mobility.   Pt would continue to benefit from PT to address these deficits and increase functional independence.     Complexity Level Hx and Co  morbidites Examination Clinical Decision Making Clinical Presentation   Low no impact 1-2 elements Limited options Stable   Moderate   1-2 factors 3 or more   Several options Evolving, plan may alter   High 3 or more 4 or more Multiple options Unstable, unpredictable       Impairments: Assessment: Decreased LE strength;Gait impairment;Decreased balance;Decreased functional mobility.     Therapy Diagnosis: generalized weakness, decreased functional mobility , decreased independence with ADL's and increased gait dysfunction due to Acute on chronic gout with septic arthritis Without therapy interventions, patient is at risk for falls and decreased quality of life.    Rehabilitation Potential:  Prognosis: Good;With continued PT status post acute discharge      Plan:    Treatment/Interventions: Exercise;Gait training;Neuromuscular re-education;Functional transfer training;LE strengthening/ROM;Continued evaluation PT Frequency: 2-3x/wk    Risks/Benefits/POC Discussed with Pt/Family: With patient          Goals:   Goals  Goal Formulation: With patient  Time for Goal Acheivement: By time of discharge  Goals: Select goal  Pt Will Stand: with contact guard assist;to maximize functional mobility and independence;3 visits  Pt Will Transfer Bed/Chair: to maximize functional mobility and independence;3 visits;with contact guard assist  Pt Will Ambulate: 11-30 feet;with rolling walker;to maximize functional mobility and independence;5 visits;with contact guard assist      Discharge Recommendations:   Based on today's session patient's discharge recommendation is the following: Discharge Recommendation: Home with home health PT;Home with supervision   DME Recommended for Discharge: Stretcher transport to the destination       Precautions and Contraindications:   Precautions  Weight Bearing Status: no restrictions  Other Precautions: Fall precautions    Medical Diagnosis: Hypoglycemia [E16.2]  Fever in adult [R50.9]  Gout [M10.9]  Chronic pain of both knees [M25.561, M25.562, G89.29]  Chronic kidney disease, unspecified CKD stage [N18.9]    History of Present Illness: Marco Bruce is a 85 y.o. male admitted on 04/26/2018 with Acute on chronic gout with septic arthritis  As per H and P:  "85 year old male with multiple medical problems as mentioned above presents with bilateral knee pain.  Patient was admitted for the similar complaint at  [place] for acute gout and discharged on 04/11/2018 with tapering doses of prednisone.  Grandson informs that his pain was better at the time of discharge but started having the pain again 4 days ago and had been progressively getting worse.  Grandson says the pain is equally severe in both  knees and patient says it is 10/10 in severity.  Patient had fever 101 yesterday and had 7 episodes of vomiting.  Patient was able to stand before but not able to bear weight due to pain for past couple days."    Patient Active Problem List   Diagnosis    CKD (chronic kidney disease), stage IV    Essential hypertension    T2DM (type 2 diabetes mellitus)    Pacemaker    Chronic pain    Acute gout of knees    Gout flare    Cellulitis        Past Medical/Surgical History:  Past Medical History:   Diagnosis Date    Acute diastolic heart failure 78/24/2353    Acute on chronic diastolic congestive heart failure 08/13/2016    Acute pulmonary edema 05/26/2015    Chronic diastolic heart failure 61/44/3154    Chronic gout     Chronic pain syndrome 07/07/2015    CKD (chronic kidney disease) stage 4, GFR 15-29 ml/min     2018 baseline creatinine in EPIC is 1.5-2.3.    Congestive heart failure (CHF)     EF 40% 5/18 echo, on lasix    Current chronic use of systemic steroids     For Polymyalgia rheumatica    Essential hypertension     Mixed hyperlipidemia     NSTEMI (non-ST elevated myocardial infarction) 08/13/2016    Osteoarthritis of both knees     Peripheral edema 06/16/2015    Pneumonia due to infectious organism, unspecified laterality, unspecified part of lung 06/25/2015    Polymyalgia rheumatica     chronic steroids    Renovascular hypertension 08/04/2015    Second degree heart block by electrocardiogram (ECG) 04/27/2015    Type 2 diabetes mellitus with complication, with long-term current use of insulin     12/01/2016 Hemoglobin A1c 8.2%      Past Surgical History:   Procedure Laterality Date    ABDOMINAL SURGERY      CHOLECYSTECTOMY      PACEMAKER Left 2017         X-Rays/Tests/Labs:  Xr Knee 1 Or 2 Views Left    Result Date: 04/26/2018  1. Unchanged since 04/09/2018. 2. Large left knee joint effusion. 3. Moderate degenerative arthritis in the left knee. Marjean Donna, MD 04/26/2018 8:48 AM    Xr  Knee 1 Or 2 Views Right    Result Date: 04/26/2018  1. Moderate right knee joint effusion increasing since 04/09/2018. 2. Moderate degenerative arthritis in the right knee. Marjean Donna, MD 04/26/2018 8:49 AM    Abdomen 2 View With Chest 1 View    Result Date: 04/26/2018  1. Cardiomegaly. No pulmonary edema. 2. Mildly distended air-filled colon which may be due to ileus. Ivin Booty  D'Heureux, MD 04/26/2018 8:48 AM        Social History:  Prior Level of Function ( PLOF obtained from [place] who was present at bedside. As per him [place] has progressive decline in functional mobility since last august and requires one person assist for mobility. Mostly uses WC in the house)  Prior level of function: Up to chair with assistance(very short distance ambulation with one  person assist)  Baseline Activity Level: No independent activity  Driving: does not drive  Cooking: No  DME Currently at Home: Wheelchair, Manual, Environmental consultant, [employer], ADL- Civil engineer, contracting  Home Living Arrangements  Living Arrangements: Children, Family members(Pt lives w/ daughter and grandson)  Type of Home: House(town house)  Home Layout: Multi-level, Bed/bath upstairs(13 steps to main level )  DME Currently at Home: Wheelchair, Manual, Walker, [employer], ADL- Shower Chair      Subjective:    Patient is agreeable to participation in the therapy session. Nursing clears patient for therapy.   Patient Goal  Patient Goal: " I will try to walk"  Pain Assessment  Pain Assessment: Numeric Scale (0-10)  Pain Score: 8-severe pain  POSS Score: Awake and Alert  Pain Location: Knee(B/L)  Pain Intervention(s): (RN notified)    Objective:   Observation of Patient/Vital Signs:  Patient is in bed with peripheral IV in place.       Cognition/Neuro Status  Arousal/Alertness: Appropriate responses to stimuli  Attention Span: Appears intact  Orientation Level: Oriented X4  Memory: Appears intact  Following Commands: Follows multistep commands consistently  Safety Awareness:  minimal verbal instruction  Insights: Educated in safety awareness  Behavior: calm;cooperative    Gross ROM  Right Lower Extremity ROM: within functional limits  Left Lower Extremity ROM: within functional limits  Gross Strength  Right Lower Extremity Strength: 4-/5  Left Lower Extremity Strength: 4-/5       Functional Mobility  Supine to Sit: Supervision  Scooting to EOB: Supervision  Sit to Supine: Supervision  Sit to Stand: with instruction for hand placement to increase safety;Minimal Assist  Stand to Sit: Minimal Assist(instructions to reach back)     Locomotion  Ambulation: Minimal Assist;with front-wheeled walker  Pattern: decreased step length;decreased cadence  Distance Walked (ft) (Step 6,7): 10 Feet  PMP Activity: Step 6 - Walks in Room   Verbal instructions for step through sequencing to include correct RW placement with advancement of bilateral LE and proper use of both arms to help compensate for LE weakness and unsteady gait.Verbal instruction provided for all above functional mobility by therapist to facilitate safe technique. Verbal instruction provided for all above functional mobility with facilitation of correct postural alignmentensuring upright posture with shoulder and hip alignment. Educated patient on the importance of coming to a complete stand and establishing posture prior to attempting ambulation or transfers. Facilitated lateral weight shifting through hip and pelvis to facilitate natural postural adjustments during gait        Balance  Balance: needs focused assessment  Standing - Static: Fair(with RW)  Standing - Dynamic: (fair- with RW)    Participation and Endurance  Participation Effort: good  Endurance: Tolerates 10 - 20 min exercise with multiple rests         AM-PAC Inpatient Short Forms  Inpatient AM-PAC Performed? (PT): Basic Mobility Inpatient Short Form  AM-PAC "6 Clicks" Basic Mobility Inpatient Short Form  Turning Over in Bed: None  Sitting Down On/Standing From  Armchair: A little  Lying on Back to Sitting on Side of Bed: None  Assist Moving to/from Bed to Chair: A little  Assist to Walk in Hospital Room: A little  Assist to Climb 3-5 Steps with Railing: Total  PT Basic Mobility Raw Score: 18  CMS 0-100% Score: 46.58%    Treatment Activities: PT evaluation, Energy conservation technique, demonstrated and performed Therapeutic exs : AP, LAQ's, Hip fl, Ankle pumps, Pt was educated on Increasing activity and  getting assist for OOB  Mobility. Encouraged to perform LE therex throughout the day to decrease effects of immobility. Encouraged to ambulate with RN staff to maintain and improve functional mobility. Pt educated on safety awareness and fall prevention strategies.Pt educated on getting up slowly from lying down to sitting at the EOB and wait for 30 -45 seconds prior to standing in case of dizziness in order to reduce fall risk. Pt verbally instructed to scan the environment for obstacles and make sure RW is with in reach. Pt verbally instructed on correct/safe hand placement and transfer techniques,use/management of RW for all functional mobility/ambulation to optimize safety and to reduce fall risk.  Pt verbalized understanding for all education provided.  .  Educated the patient to role of physical therapy, plan of care, goals of therapy and safety with mobility and ADLs.    At end of session pt seated upright in chair, call bell and items in reach. RN aware.    Myrla Halsted, PT

## 2018-04-28 NOTE — Discharge Instr - AVS First Page (Signed)
Home Health Discharge Information     Your discharge planner is Norton Blizzard, [phone].  Your doctor has ordered Physical Therapy and Occupational Therapy in-home service(s) for you while you recuperate at home, to assist you in the transition from hospital to home.        The agency that you or your representative chose to provide the service:  Name of [place] Placement: [facility] [phone]          The above services were set up by:  Chaney Malling  ([place])   Phone      [phone]      Additional comments:   If you have not heard from your home health agency within 24-48 hours after discharge please call your agency to arrange a time for your first visit.  For any scheduling concerns or questions related to home health, such as time or date please contact your home health agency at the number listed above.

## 2018-04-28 NOTE — Telephone Encounter (Signed)
lmtcb x1 

## 2018-04-28 NOTE — Progress Notes (Signed)
Situation   Pt is cleared for d/c.   Background Pt lives w/ daughter and grandson in a two level house w/ 13 STE and bedroom is on the main floor. Yolanda Bonine is pt's Theatre manager and assists as needed. Per grandson, he is with the pt during day time and daughter in the evenings. Per grandson, someone is with pt at all times. Pt ambulates inside the house w/ a R.W and one person assist. He uses the W/C outside. Pt requires assistance w/ ADLs. Yolanda Bonine drives him to appointments.     Assessment   Met w/ pt and grandson at bedside. P.T recommended ambulance transport to home due to acute bilateral knee pain, weakness, and pt has 13 steps to enter. Grandson also requested ambulance transportation to home. Skilled [facility] services arranged through [facility].    Recommendation   Home w/ family support; H.H services; Medicaid Waiver services and out patient f/u. PTS pick up is at 5 p.m.         04/28/18 1515   Discharge Disposition   Patient preference/choice provided? Yes   Physical Discharge Disposition Home, Home Health   Name of [facility]   Mode of Transportation Ambulance   Patient/Family/POA notified of transfer plan Yes   Patient agreeable to discharge plan/expected d/c date? Yes   Bedside nurse notified of transport plan? Yes   [facility]   CM Interventions   Follow up appointment scheduled? No   Reason no follow up scheduled? Family to schedule   Referral made for home health RN visit? Yes   Multidisciplinary rounds/family meeting before d/c? Yes   Medicare Checklist   Is this a Medicare patient? Yes   Patient received 1st IMM Letter? Yes   If LOS 3 days or greater, did patient received 2nd IMM Letter? n/a

## 2018-04-29 ENCOUNTER — Telehealth (INDEPENDENT_AMBULATORY_CARE_PROVIDER_SITE_OTHER): Payer: Self-pay

## 2018-04-29 MED ORDER — LANTUS SOLOSTAR 100 UNIT/ML SC SOPN
25.00 [IU] | PEN_INJECTOR | Freq: Every evening | SUBCUTANEOUS | 0 refills | Status: DC
Start: 2018-04-29 — End: 2018-05-30

## 2018-04-29 NOTE — Telephone Encounter (Signed)
Daughter called and states that patient does not have lantus 25 units for tonight since their discharge last night. See previous notes from Dr. Herbert Pun.

## 2018-04-29 NOTE — Telephone Encounter (Signed)
Called daughter to inform her about med refill with lantus. No answer. Will try again at a later time.

## 2018-05-02 ENCOUNTER — Other Ambulatory Visit (INDEPENDENT_AMBULATORY_CARE_PROVIDER_SITE_OTHER): Payer: Self-pay | Admitting: Family Medicine

## 2018-05-02 ENCOUNTER — Telehealth (INDEPENDENT_AMBULATORY_CARE_PROVIDER_SITE_OTHER): Payer: Self-pay | Admitting: Family Medicine

## 2018-05-02 DIAGNOSIS — M545 Low back pain, unspecified: Secondary | ICD-10-CM

## 2018-05-02 DIAGNOSIS — N184 Chronic kidney disease, stage 4 (severe): Secondary | ICD-10-CM

## 2018-05-02 DIAGNOSIS — M170 Bilateral primary osteoarthritis of knee: Secondary | ICD-10-CM

## 2018-05-02 DIAGNOSIS — E782 Mixed hyperlipidemia: Secondary | ICD-10-CM

## 2018-05-02 MED ORDER — METOPROLOL SUCCINATE ER 25 MG PO TB24
12.50 mg | ORAL_TABLET | Freq: Every day | ORAL | 0 refills | Status: DC
Start: 2018-05-02 — End: 2018-08-07

## 2018-05-02 MED ORDER — ATORVASTATIN CALCIUM 40 MG PO TABS
40.00 mg | ORAL_TABLET | Freq: Every evening | ORAL | 0 refills | Status: AC
Start: 2018-05-02 — End: 2018-06-01

## 2018-05-02 MED ORDER — GABAPENTIN 100 MG PO CAPS
ORAL_CAPSULE | ORAL | 0 refills | Status: DC
Start: 2018-05-02 — End: 2018-06-01

## 2018-05-02 MED ORDER — SODIUM BICARBONATE 650 MG PO TABS
1300.0000 mg | ORAL_TABLET | Freq: Two times a day (BID) | ORAL | 0 refills | Status: DC
Start: 2018-05-02 — End: 2018-06-01

## 2018-05-02 NOTE — Telephone Encounter (Signed)
Noted, follow-up with PCP.

## 2018-05-02 NOTE — Telephone Encounter (Signed)
Received a call from someone at [facility] regarding a home visit they had today with the patient.  They wanted to inform us they are starting home health as well as OT, which he will have twice weekly for the first several weeks and then decreased to once weekly until discharge.  There main concern today is that there are possibly drug interactions between several of his medications as stated below-  1. 81 mg aspirin and prednisone  2. Gabapentin and tramadol  They stated both are listed as severe interactions in their computer and they wanted to double check this with [place]. Please advise. Thanks!

## 2018-05-02 NOTE — Telephone Encounter (Signed)
Spoke with patient's daughter [name] who was given the information that the home health nurse relayed to our office as well as Dr. Mayra Reel recommendations to follow up with his PCP. Daughter was also offered appointment with Dr. Earley Favor if she felt patient cannot wait until PCP returns to the office. Per daughter, patient is unable to physically come in to the office due to ambulation difficulties and will continue to be monitored by home health until his PCP comes back, at which time she will try to make an appointment.

## 2018-05-03 ENCOUNTER — Encounter (INDEPENDENT_AMBULATORY_CARE_PROVIDER_SITE_OTHER): Payer: Self-pay | Admitting: Family Medicine

## 2018-05-03 NOTE — Telephone Encounter (Signed)
Faxed to pharmacy by nurse AS,RN

## 2018-05-04 ENCOUNTER — Encounter (INDEPENDENT_AMBULATORY_CARE_PROVIDER_SITE_OTHER): Payer: Self-pay | Admitting: Family Medicine

## 2018-05-06 ENCOUNTER — Encounter (INDEPENDENT_AMBULATORY_CARE_PROVIDER_SITE_OTHER): Payer: Self-pay | Admitting: Family Medicine

## 2018-05-15 ENCOUNTER — Telehealth: Payer: Self-pay | Admitting: Family Medicine

## 2018-05-15 NOTE — Telephone Encounter (Signed)
Spoke with patient's daughter Marco Bruce who was advised by Dr. Herbert Pun to call and report patient's blood sugars. Average blood sugar in AM is 70-76 and evenings 156 on average. He is currently 25 units of Lantus at bedtime. Daughter states he does not have any observable symptoms in the mornings but if he feels it is low, he drinks "something sweet, like ensure or eats a banana".

## 2018-05-15 NOTE — Telephone Encounter (Signed)
Patient's daughter given the results/recommendations and verbalized understanding.

## 2018-05-15 NOTE — Telephone Encounter (Signed)
Patient daughter called stated her father blood sugar low in morning may have to decrease  insulin please call her back # [phone]

## 2018-05-15 NOTE — Telephone Encounter (Signed)
Please advise the patient's daughter that Lantus does not decrease the blood sugar or drop the blood sugar test significantly.  However the linagliptin also known as Tradjenta can drop the blood sugar.  Therefore I would hold the linagliptin if the blood sugar is less than 100.  Please advised the patient is to eat and stay hydrated, if you skip meals he should not be using the linagliptin.

## 2018-05-17 ENCOUNTER — Encounter (INDEPENDENT_AMBULATORY_CARE_PROVIDER_SITE_OTHER): Payer: Self-pay

## 2018-05-24 ENCOUNTER — Telehealth (INDEPENDENT_AMBULATORY_CARE_PROVIDER_SITE_OTHER): Payer: Self-pay | Admitting: Family Medicine

## 2018-05-24 NOTE — Telephone Encounter (Signed)
Home Health called to inform that the patient has been discharged from [facility] as of yesterday, Tuesday May 23, 2018

## 2018-05-25 ENCOUNTER — Encounter (INDEPENDENT_AMBULATORY_CARE_PROVIDER_SITE_OTHER): Payer: Self-pay | Admitting: Family Medicine

## 2018-05-30 ENCOUNTER — Other Ambulatory Visit (INDEPENDENT_AMBULATORY_CARE_PROVIDER_SITE_OTHER): Payer: Self-pay | Admitting: Family Medicine

## 2018-05-30 DIAGNOSIS — Z794 Long term (current) use of insulin: Secondary | ICD-10-CM

## 2018-05-30 NOTE — Telephone Encounter (Signed)
Last office Visit: 01/12/18    Future visits: None    Last refill: 04/29/18  Amt given: 60ml  # of refills: 0    Last labs: Multiple labs in 04/2018

## 2018-05-31 MED ORDER — LANTUS SOLOSTAR 100 UNIT/ML SC SOPN
25.00 [IU] | PEN_INJECTOR | Freq: Every evening | SUBCUTANEOUS | 0 refills | Status: DC
Start: 2018-05-31 — End: 2018-06-20

## 2018-05-31 NOTE — Telephone Encounter (Signed)
Pt need seen as scheduled in few days. Bring blood sugar log for visit. Been in hosp few times ,also with hypoglycemia. I do not know what dose insulin he is on currently. I sent one lantus refill but advise follow what was told by recent discharge (regarding Insulin dose).  Should check sugar daily three times a day.

## 2018-06-01 ENCOUNTER — Encounter (INDEPENDENT_AMBULATORY_CARE_PROVIDER_SITE_OTHER): Payer: Self-pay | Admitting: Family Medicine

## 2018-06-01 ENCOUNTER — Other Ambulatory Visit (INDEPENDENT_AMBULATORY_CARE_PROVIDER_SITE_OTHER): Payer: Self-pay

## 2018-06-01 DIAGNOSIS — M170 Bilateral primary osteoarthritis of knee: Secondary | ICD-10-CM

## 2018-06-01 DIAGNOSIS — M79605 Pain in left leg: Secondary | ICD-10-CM

## 2018-06-01 DIAGNOSIS — N184 Chronic kidney disease, stage 4 (severe): Secondary | ICD-10-CM

## 2018-06-02 ENCOUNTER — Encounter (INDEPENDENT_AMBULATORY_CARE_PROVIDER_SITE_OTHER): Payer: Self-pay | Admitting: Family Medicine

## 2018-06-02 ENCOUNTER — Telehealth (INDEPENDENT_AMBULATORY_CARE_PROVIDER_SITE_OTHER): Payer: Self-pay

## 2018-06-02 MED ORDER — SODIUM BICARBONATE 650 MG PO TABS
1300.00 mg | ORAL_TABLET | Freq: Two times a day (BID) | ORAL | 5 refills | Status: AC
Start: 2018-06-02 — End: 2018-07-02

## 2018-06-02 MED ORDER — GABAPENTIN 100 MG PO CAPS
ORAL_CAPSULE | ORAL | 0 refills | Status: DC
Start: 2018-06-02 — End: 2018-06-29

## 2018-06-02 NOTE — Telephone Encounter (Signed)
Faxed Gabapentin rx

## 2018-06-05 ENCOUNTER — Telehealth: Payer: Self-pay | Admitting: Family Medicine

## 2018-06-05 NOTE — Telephone Encounter (Signed)
Mimi from [facility] would like you to call her. [phone]. She has a question about Lantus.

## 2018-06-05 NOTE — Telephone Encounter (Signed)
Returned pharmacies call regarding lantus. Insurance was asking for 3 month supply. Per Dr. Herbert Pun, 3 month supply was not authorized. Pharmacy informed.

## 2018-06-06 ENCOUNTER — Encounter (INDEPENDENT_AMBULATORY_CARE_PROVIDER_SITE_OTHER): Payer: Self-pay

## 2018-06-06 NOTE — Telephone Encounter (Signed)
Spoke to the individual that answered the phone, states pt does not speak english.  Advised to please have pt bring finger stick log to visit Thursday. States she will pass this message on.

## 2018-06-08 ENCOUNTER — Ambulatory Visit (INDEPENDENT_AMBULATORY_CARE_PROVIDER_SITE_OTHER): Payer: Medicare Other | Admitting: Family Medicine

## 2018-06-08 ENCOUNTER — Encounter (INDEPENDENT_AMBULATORY_CARE_PROVIDER_SITE_OTHER): Payer: Self-pay | Admitting: Family Medicine

## 2018-06-08 VITALS — BP 119/62 | HR 79 | Temp 97.7°F | Resp 16 | Wt 155.0 lb

## 2018-06-08 DIAGNOSIS — M109 Gout, unspecified: Secondary | ICD-10-CM

## 2018-06-08 DIAGNOSIS — IMO0001 Reserved for inherently not codable concepts without codable children: Secondary | ICD-10-CM

## 2018-06-08 DIAGNOSIS — E119 Type 2 diabetes mellitus without complications: Secondary | ICD-10-CM

## 2018-06-08 DIAGNOSIS — Z09 Encounter for follow-up examination after completed treatment for conditions other than malignant neoplasm: Secondary | ICD-10-CM

## 2018-06-08 DIAGNOSIS — M353 Polymyalgia rheumatica: Secondary | ICD-10-CM

## 2018-06-08 DIAGNOSIS — Z2821 Immunization not carried out because of patient refusal: Secondary | ICD-10-CM

## 2018-06-08 DIAGNOSIS — N184 Chronic kidney disease, stage 4 (severe): Secondary | ICD-10-CM

## 2018-06-08 DIAGNOSIS — Z794 Long term (current) use of insulin: Secondary | ICD-10-CM

## 2018-06-08 NOTE — Progress Notes (Signed)
Have you seen any specialists/other providers since your last visit with [place]?    No    Arm preference verified?   Yes    The patient is due for eye exam, foot exam, depression screening, shingles vaccine and Medicare Wellness      Subjective:      Patient ID: Marco Bruce is a 85 y.o. male.    Chief Complaint:  Chief Complaint   Patient presents with    Hypertension    Diabetes Follow-up       HPI:    In with 70 yr grandson  Xle hosp fu for gout attacks  Admitted to hosp with Rheum consult    Hosp FU as below:    Recurrent acuteonchronic gout withcellulitis  History of PMR  -S/p bedside arthrocentesis of both kneeson 04/26/2017-synovial fluid cultures have been negative  -Initially treated with IV Rocephin -will discharge home on oral Keflex to complete 5 days course-discussed with Dr. Su Ley  -Stopped IV steroids and started on prednisone taper  -Cont home dose of allopurinol 100mg /d  -will need increase dose of allopurinol or to switch to uloricbut not during acute flareas can worsen flare-will need to be done outpt  -low purine diet  -pt to f/up with outpt rheumatology for further management    Gram-positive cocci bacteremia -coagulase-negative staph  -Most likely contaminant    Insulin dependent diabetes type 2  -Continue Lantus and linagliptin    Stage IV chronic kidney disease  -Creatinine at baseline    Renovascular hypertension  -Continue Toprol-XL  -Blood pressure stable    Mixedhyperlipidemia  -Continue Lipitor    Chronic systolic congestive heart failure ejection fraction of 35 to 40% per echo on 01/2018  -Continue Lasix, statins, aspirin and beta-blockers    History of heart block status post pacemaker placement in 2017    Grandsonhelpedwith translation.Patient speaks Khmer only.    Disposition:home    25 units Insulin at night since last hosp discharge    Feeling better  Knee pain much better    DM- on 25 units  Family not checking sugar before and was told to call.  Used to be  on 10 units and with prednisoen at hosp was increased to 25 units  But with going off prednisone need reduce Insulin based on sugar  Grandson states his mom never wanted sugar tested and always wanted Insulin given no matter what!  He is testing sugar and brought log on his phone  FBS 70's    Problem List:  Patient Active Problem List   Diagnosis    CKD (chronic kidney disease), stage IV    Essential hypertension    IDDM (insulin dependent diabetes mellitus)    Pacemaker    Chronic pain    Acute gout of knees    Gout flare    Cellulitis    PMR (polymyalgia rheumatica)       Current Medications:  Current Outpatient Medications   Medication Sig Dispense Refill    acetaminophen (TYLENOL) 325 MG tablet Take 2 tablets (650 mg total) by mouth every 6 (six) hours      allopurinol (ZYLOPRIM) 100 MG tablet Take 1 tablet (100 mg total) by mouth daily 90 tablet 3    aspirin 81 MG chewable tablet Chew 1 tablet (81 mg total) by mouth daily      furosemide (LASIX) 40 MG tablet Take 1 tablet (40 mg total) by mouth daily      gabapentin (NEURONTIN) 100 MG capsule TAKE  ONE CAPSULE BY MOUTH EVERY 8 HOURS 90 capsule 0    insulin glargine (LANTUS SOLOSTAR) 100 UNIT/ML injection pen Inject 25 Units into the skin nightly Check blood sugar prior to administration. Hold if BS <100. 9 mL 0    lactobacillus/streptococcus (RISAQUAD) Cap Take 1 capsule by mouth daily      linaGLIPtin 5 MG Tab Take 1 tablet (5 mg total) by mouth daily 90 tablet 2    metoprolol succinate XL (TOPROL-XL) 25 MG 24 hr tablet Take 0.5 tablets (12.5 mg total) by mouth daily 30 tablet 0    pantoprazole (PROTONIX) 40 MG tablet Take 1 tablet (40 mg total) by mouth nightly 90 tablet 0    sodium bicarbonate 650 MG tablet Take 2 tablets (1,300 mg total) by mouth 2 (two) times daily 60 tablet 5     No current facility-administered medications for this visit.        Allergies:  No Known Allergies    Past Medical History:  Past Medical History:   Diagnosis  Date    Acute diastolic heart failure 30/16/0109    Acute on chronic diastolic congestive heart failure 08/13/2016    Acute pulmonary edema 05/26/2015    Chronic diastolic heart failure 32/35/5732    Chronic gout     Chronic pain syndrome 07/07/2015    CKD (chronic kidney disease) stage 4, GFR 15-29 ml/min     2018 baseline creatinine in EPIC is 1.5-2.3.    Congestive heart failure (CHF)     EF 40% 5/18 echo, on lasix    Current chronic use of systemic steroids     For Polymyalgia rheumatica    Essential hypertension     Mixed hyperlipidemia     NSTEMI (non-ST elevated myocardial infarction) 08/13/2016    Osteoarthritis of both knees     Peripheral edema 06/16/2015    Pneumonia due to infectious organism, unspecified laterality, unspecified part of lung 06/25/2015    Polymyalgia rheumatica     chronic steroids    Renovascular hypertension 08/04/2015    Second degree heart block by electrocardiogram (ECG) 04/27/2015    Type 2 diabetes mellitus with complication, with long-term current use of insulin     12/01/2016 Hemoglobin A1c 8.2%       Past Surgical History:  Past Surgical History:   Procedure Laterality Date    ABDOMINAL SURGERY      CHOLECYSTECTOMY      PACEMAKER Left 2017       Family History:  Family History   Problem Relation Age of Onset    Hypertension Father        Social History:  Social History     Socioeconomic History    Marital status: Widowed     Spouse name: Not on file    Number of children: Not on file    Years of education: Not on file    Highest education level: Not on file   Occupational History    Not on file   Social Needs    Financial resource strain: Not on file    Food insecurity:     Worry: Not on file     Inability: Not on file    Transportation needs:     Medical: Not on file     Non-medical: Not on file   Tobacco Use    Smoking status: Never Smoker    Smokeless tobacco: Never Used   Substance and Sexual Activity    Alcohol use: No  Comment: former  alcoholic    Drug use: No    Sexual activity: Not on file   Lifestyle    Physical activity:     Days per week: Not on file     Minutes per session: Not on file    Stress: Not on file   Relationships    Social connections:     Talks on phone: Not on file     Gets together: Not on file     Attends religious service: Not on file     Active member of club or organization: Not on file     Attends meetings of clubs or organizations: Not on file     Relationship status: Not on file    Intimate partner violence:     Fear of current or ex partner: Not on file     Emotionally abused: Not on file     Physically abused: Not on file     Forced sexual activity: Not on file   Other Topics Concern    Not on file   Social History Narrative    Not on file       The following sections were reviewed this encounter by the provider:   Tobacco   Allergies   Meds   Problems   Med Hx   Surg Hx   Fam Hx             Vitals:  BP 119/62 (BP Site: Left arm, Patient Position: Sitting, Cuff Size: Medium)    Pulse 79    Temp 97.7 F (36.5 C) (Oral)    Resp 16    Wt 70.3 kg (155 lb) Comment: [facility]SON REPORTED. FROM FEBRUARY   SpO2 97%    BMI 28.34 kg/m     REVIEW OF SYSTEMS:  Review of Systems   Constitutional: Positive for malaise/fatigue. Negative for chills and fever.   HENT: Negative for congestion, ear pain and sore throat.    Eyes: Negative for blurred vision, double vision and pain.   Respiratory: Negative for cough, shortness of breath and wheezing.    Cardiovascular: Negative for chest pain, palpitations and leg swelling.   Gastrointestinal: Negative for abdominal pain, diarrhea, heartburn, nausea and vomiting.   Genitourinary: Negative for dysuria, hematuria and urgency.   Musculoskeletal: Positive for joint pain. Negative for myalgias.   Skin: Negative for rash.        PHYSICAL EXAM:  Physical Exam   Constitutional: He is well-developed, well-nourished, and in no distress. No distress.   HENT:   Right Ear: External ear normal.    Left Ear: External ear normal.   Nose: Nose normal.   Mouth/Throat: Oropharynx is clear and moist. No oropharyngeal exudate.   Eyes: Pupils are equal, round, and reactive to light. Conjunctivae and EOM are normal. No scleral icterus.   Neck: Normal range of motion. Neck supple. No thyromegaly present.   Cardiovascular: Normal rate and regular rhythm.   Murmur heard.  Pulmonary/Chest: Effort normal and breath sounds normal. No respiratory distress. He has no wheezes. He has no rales.   Abdominal: Soft. Bowel sounds are normal. He exhibits no distension. There is no abdominal tenderness.   Musculoskeletal: Normal range of motion.   Neurological: He is alert.   In wheel chair   Skin: Skin is warm and dry. He is not diaphoretic.   Psychiatric: Mood normal.   Nursing note and vitals reviewed.         Lab Results   Component  Value Date    WBC 10.52 (H) 04/28/2018    HGB 9.4 (L) 04/28/2018    HCT 28.7 (L) 04/28/2018    PLT 142 04/28/2018    CHOL 113 (L) 05/31/2006    TRIG 125 05/31/2006    LDL 45 (L) 05/31/2006    ALT 9 04/26/2018    AST 26 04/26/2018    NA 136 04/28/2018    K 4.1 04/28/2018    CL 106 04/28/2018    CREAT 2.1 (H) 04/28/2018    BUN 58.1 (H) 04/28/2018    CO2 22 04/28/2018    TSH 2.54 06/02/2017    PSA 1.0 01/12/2006    INR 1.1 04/26/2018    GLU 259 (H) 04/28/2018    HGBA1C 6.2 (H) 04/11/2018         Assessment:     1. Hospital discharge follow-up  recordsreviewed  2. CKD (chronic kidney disease), stage IV  - Nephrology Referral: Ellery Plunk, MD ([facility])  Been asking [id number] refill  Should see Nephro    3. Gout, unspecified cause, unspecified chronicity, unspecified site  - Nephrology Referral: Ellery Plunk, MD ([facility])  Fu with rheum  4. Refused influenza vaccine  Risks of not doing flu shot discussed   5. IDDM (insulin dependent diabetes mellitus)  FBS always 70  REDUCE INSULIN TO 20 units at bedtime  04/11/18 A1c  6.2    Last ophthal visit - 2019 - due for one  Check sugar three times a day  rtn 1 week with sugar log    6. PMR (polymyalgia rheumatica)    Return in about 2 weeks (around 06/22/2018) for 30 minute dm fu appt.      Ovid Witman Sidonie Dickens, MD

## 2018-06-11 ENCOUNTER — Encounter (INDEPENDENT_AMBULATORY_CARE_PROVIDER_SITE_OTHER): Payer: Self-pay

## 2018-06-11 DIAGNOSIS — M353 Polymyalgia rheumatica: Secondary | ICD-10-CM | POA: Insufficient documentation

## 2018-06-12 ENCOUNTER — Encounter (INDEPENDENT_AMBULATORY_CARE_PROVIDER_SITE_OTHER): Payer: Self-pay

## 2018-06-14 ENCOUNTER — Telehealth (INDEPENDENT_AMBULATORY_CARE_PROVIDER_SITE_OTHER): Payer: Self-pay | Admitting: Family Medicine

## 2018-06-14 DIAGNOSIS — M353 Polymyalgia rheumatica: Secondary | ICD-10-CM

## 2018-06-14 MED ORDER — PREDNISONE 5 MG PO TABS
5.0000 mg | ORAL_TABLET | Freq: Every day | ORAL | 0 refills | Status: DC
Start: 2018-06-14 — End: 2018-12-28

## 2018-06-14 NOTE — Telephone Encounter (Signed)
Spoke with daughter now.state.  The patient is in a lot of pain, but does not have an appointment to see the dermatologist until next month.  She just requesting if she can have prednisone refill for him because he is in a lot of pain.  Also with regards to the blood sugar.  His sugars has been ranging from 90-110, according to her.  Prednisone will increase the blood sugars are needs to monitor her blood sugars 3 times a day and call us back with a blood sugar log in 24-48 hours discussed.

## 2018-06-14 NOTE — Telephone Encounter (Signed)
Pt called regards to getting some medication for gout until he can make a app with the foot doctor.      Pt can be reached [phone]

## 2018-06-14 NOTE — Telephone Encounter (Signed)
Dr Herbert Pun  Please advise

## 2018-06-20 ENCOUNTER — Other Ambulatory Visit (INDEPENDENT_AMBULATORY_CARE_PROVIDER_SITE_OTHER): Payer: Self-pay | Admitting: Family Medicine

## 2018-06-20 DIAGNOSIS — Z794 Long term (current) use of insulin: Secondary | ICD-10-CM

## 2018-06-21 ENCOUNTER — Telehealth (INDEPENDENT_AMBULATORY_CARE_PROVIDER_SITE_OTHER): Payer: Self-pay | Admitting: Family Medicine

## 2018-06-21 ENCOUNTER — Encounter (INDEPENDENT_AMBULATORY_CARE_PROVIDER_SITE_OTHER): Payer: Self-pay | Admitting: Family Medicine

## 2018-06-21 MED ORDER — LANTUS SOLOSTAR 100 UNIT/ML SC SOPN
25.00 [IU] | PEN_INJECTOR | Freq: Every evening | SUBCUTANEOUS | 0 refills | Status: DC
Start: 2018-06-21 — End: 2018-06-26

## 2018-06-21 NOTE — Telephone Encounter (Signed)
Called pt. Daughter discussed with me that she is not bringing him tomorrow for appt due to COVID 19.  But she says he has redness and blister in his foot.  Being diabetic need treated.Risks of untreated foot infection including amputation discussed.  She wants to send picture of mychart.

## 2018-06-22 ENCOUNTER — Ambulatory Visit (INDEPENDENT_AMBULATORY_CARE_PROVIDER_SITE_OTHER): Payer: Medicare Other | Admitting: Family Medicine

## 2018-06-26 ENCOUNTER — Other Ambulatory Visit (INDEPENDENT_AMBULATORY_CARE_PROVIDER_SITE_OTHER): Payer: Self-pay | Admitting: Family Medicine

## 2018-06-26 DIAGNOSIS — E1122 Type 2 diabetes mellitus with diabetic chronic kidney disease: Secondary | ICD-10-CM

## 2018-06-26 NOTE — Telephone Encounter (Signed)
Pharmacy requesting 90 day supply of lantus if approved. Please advise. Thanks!

## 2018-06-27 ENCOUNTER — Encounter (INDEPENDENT_AMBULATORY_CARE_PROVIDER_SITE_OTHER): Payer: Self-pay

## 2018-06-27 MED ORDER — LANTUS SOLOSTAR 100 UNIT/ML SC SOPN
25.00 [IU] | PEN_INJECTOR | Freq: Every evening | SUBCUTANEOUS | 0 refills | Status: DC
Start: 2018-06-27 — End: 2018-08-18

## 2018-06-27 NOTE — Telephone Encounter (Signed)
sent 

## 2018-06-29 ENCOUNTER — Other Ambulatory Visit (INDEPENDENT_AMBULATORY_CARE_PROVIDER_SITE_OTHER): Payer: Self-pay | Admitting: Family Medicine

## 2018-06-29 DIAGNOSIS — M79605 Pain in left leg: Secondary | ICD-10-CM

## 2018-06-29 DIAGNOSIS — M170 Bilateral primary osteoarthritis of knee: Secondary | ICD-10-CM

## 2018-06-29 MED ORDER — PANTOPRAZOLE SODIUM 40 MG PO TBEC
40.00 mg | DELAYED_RELEASE_TABLET | Freq: Every evening | ORAL | 0 refills | Status: DC
Start: 2018-06-29 — End: 2018-10-03

## 2018-06-29 MED ORDER — GABAPENTIN 100 MG PO CAPS
ORAL_CAPSULE | ORAL | 0 refills | Status: DC
Start: 2018-06-29 — End: 2018-07-31

## 2018-06-29 NOTE — Telephone Encounter (Signed)
Gabapentin rx faxed

## 2018-06-29 NOTE — Telephone Encounter (Signed)
Last Encounter:  06/08/2018  Next Appointment: None    Last Primary Care Labs:   Lab Results   Component Value Date    WBC 10.52 (H) 04/28/2018    HGB 9.4 (L) 04/28/2018    HCT 28.7 (L) 04/28/2018    PLT 142 04/28/2018    CHOL 113 (L) 05/31/2006    TRIG 125 05/31/2006    LDL 45 (L) 05/31/2006    ALT 9 04/26/2018    AST 26 04/26/2018    NA 136 04/28/2018    K 4.1 04/28/2018    CL 106 04/28/2018    CREAT 2.1 (H) 04/28/2018    BUN 58.1 (H) 04/28/2018    CO2 22 04/28/2018    TSH 2.54 06/02/2017    PSA 1.0 01/12/2006    INR 1.1 04/26/2018    GLU 259 (H) 04/28/2018    HGBA1C 6.2 (H) 04/11/2018

## 2018-06-30 ENCOUNTER — Encounter (INDEPENDENT_AMBULATORY_CARE_PROVIDER_SITE_OTHER): Payer: Self-pay | Admitting: Family Medicine

## 2018-07-12 ENCOUNTER — Encounter (INDEPENDENT_AMBULATORY_CARE_PROVIDER_SITE_OTHER): Payer: Self-pay

## 2018-07-13 ENCOUNTER — Encounter (INDEPENDENT_AMBULATORY_CARE_PROVIDER_SITE_OTHER): Payer: Self-pay

## 2018-07-31 ENCOUNTER — Encounter (INDEPENDENT_AMBULATORY_CARE_PROVIDER_SITE_OTHER): Payer: Self-pay

## 2018-07-31 ENCOUNTER — Other Ambulatory Visit (INDEPENDENT_AMBULATORY_CARE_PROVIDER_SITE_OTHER): Payer: Self-pay | Admitting: Family Medicine

## 2018-07-31 DIAGNOSIS — M170 Bilateral primary osteoarthritis of knee: Secondary | ICD-10-CM

## 2018-07-31 DIAGNOSIS — M79605 Pain in left leg: Secondary | ICD-10-CM

## 2018-07-31 MED ORDER — GABAPENTIN 100 MG PO CAPS
ORAL_CAPSULE | ORAL | 2 refills | Status: DC
Start: 2018-07-31 — End: 2018-11-06

## 2018-07-31 NOTE — Telephone Encounter (Signed)
Gabapentin Rx has been faxed

## 2018-08-04 ENCOUNTER — Encounter (INDEPENDENT_AMBULATORY_CARE_PROVIDER_SITE_OTHER): Payer: Self-pay | Admitting: Family Medicine

## 2018-08-07 ENCOUNTER — Other Ambulatory Visit (INDEPENDENT_AMBULATORY_CARE_PROVIDER_SITE_OTHER): Payer: Self-pay | Admitting: Family Medicine

## 2018-08-07 NOTE — Telephone Encounter (Signed)
Last Office Visit: 06/08/2018  Next Appointment:  None    Pharmacy:  [facility] [address]    Last Primary Care Labs:   Lab Results   Component Value Date    WBC 10.52 (H) 04/28/2018    HGB 9.4 (L) 04/28/2018    HCT 28.7 (L) 04/28/2018    PLT 142 04/28/2018    CHOL 113 (L) 05/31/2006    TRIG 125 05/31/2006    LDL 45 (L) 05/31/2006    ALT 9 04/26/2018    AST 26 04/26/2018    NA 136 04/28/2018    K 4.1 04/28/2018    CL 106 04/28/2018    CREAT 2.1 (H) 04/28/2018    BUN 58.1 (H) 04/28/2018    CO2 22 04/28/2018    TSH 2.54 06/02/2017    PSA 1.0 01/12/2006    INR 1.1 04/26/2018    GLU 259 (H) 04/28/2018    HGBA1C 6.2 (H) 04/11/2018

## 2018-08-08 ENCOUNTER — Ambulatory Visit
Admission: RE | Admit: 2018-08-08 | Discharge: 2018-08-08 | Disposition: A | Discharge status: Home / Self Care | Payer: Medicare Other | Source: Ambulatory Visit | Attending: Internal Medicine | Admitting: Internal Medicine

## 2018-08-08 ENCOUNTER — Other Ambulatory Visit: Payer: Self-pay | Admitting: Internal Medicine

## 2018-08-08 ENCOUNTER — Other Ambulatory Visit (INDEPENDENT_AMBULATORY_CARE_PROVIDER_SITE_OTHER): Payer: Self-pay | Admitting: Family Medicine

## 2018-08-08 DIAGNOSIS — M25562 Pain in left knee: Secondary | ICD-10-CM

## 2018-08-08 DIAGNOSIS — M1000 Idiopathic gout, unspecified site: Secondary | ICD-10-CM | POA: Insufficient documentation

## 2018-08-08 DIAGNOSIS — M25561 Pain in right knee: Secondary | ICD-10-CM

## 2018-08-08 MED ORDER — METOPROLOL SUCCINATE ER 25 MG PO TB24
12.50 mg | ORAL_TABLET | Freq: Every day | ORAL | 0 refills | Status: DC
Start: 2018-08-08 — End: 2018-08-08

## 2018-08-08 MED ORDER — METOPROLOL SUCCINATE ER 25 MG PO TB24
ORAL_TABLET | ORAL | 0 refills | Status: DC
Start: 2018-08-08 — End: 2018-08-30

## 2018-08-08 NOTE — Telephone Encounter (Signed)
Last Encounter:  06/08/2018  Next Appointment: none    Last Primary Care Labs:   Lab Results   Component Value Date    WBC 10.52 (H) 04/28/2018    HGB 9.4 (L) 04/28/2018    HCT 28.7 (L) 04/28/2018    PLT 142 04/28/2018    CHOL 113 (L) 05/31/2006    TRIG 125 05/31/2006    LDL 45 (L) 05/31/2006    ALT 9 04/26/2018    AST 26 04/26/2018    NA 136 04/28/2018    K 4.1 04/28/2018    CL 106 04/28/2018    CREAT 2.1 (H) 04/28/2018    BUN 58.1 (H) 04/28/2018    CO2 22 04/28/2018    TSH 2.54 06/02/2017    PSA 1.0 01/12/2006    INR 1.1 04/26/2018    GLU 259 (H) 04/28/2018    HGBA1C 6.2 (H) 04/11/2018   ,mh

## 2018-08-11 ENCOUNTER — Encounter (INDEPENDENT_AMBULATORY_CARE_PROVIDER_SITE_OTHER): Payer: Self-pay

## 2018-08-14 ENCOUNTER — Encounter (INDEPENDENT_AMBULATORY_CARE_PROVIDER_SITE_OTHER): Payer: Self-pay

## 2018-08-18 ENCOUNTER — Other Ambulatory Visit (INDEPENDENT_AMBULATORY_CARE_PROVIDER_SITE_OTHER): Payer: Self-pay | Admitting: Family Medicine

## 2018-08-18 DIAGNOSIS — Z794 Long term (current) use of insulin: Secondary | ICD-10-CM

## 2018-08-18 MED ORDER — LANTUS SOLOSTAR 100 UNIT/ML SC SOPN
25.00 [IU] | PEN_INJECTOR | Freq: Every evening | SUBCUTANEOUS | 0 refills | Status: DC
Start: 2018-08-18 — End: 2018-11-07

## 2018-08-18 NOTE — Telephone Encounter (Signed)
Last office Visit: 06/08/18    Future visits: None    Last refill: 06/27/18  #  Given: 55ml  # of refills: 0    Last labs: 04/28/18, 04/11/18 (A1C)

## 2018-08-30 ENCOUNTER — Other Ambulatory Visit (INDEPENDENT_AMBULATORY_CARE_PROVIDER_SITE_OTHER): Payer: Self-pay

## 2018-08-31 MED ORDER — METOPROLOL SUCCINATE ER 25 MG PO TB24
ORAL_TABLET | ORAL | 0 refills | Status: DC
Start: 2018-08-31 — End: 2018-10-04

## 2018-09-08 ENCOUNTER — Telehealth (INDEPENDENT_AMBULATORY_CARE_PROVIDER_SITE_OTHER): Payer: Self-pay | Admitting: Family Medicine

## 2018-09-08 NOTE — Telephone Encounter (Signed)
Please call patient's daughter.  LOV 06/2018

## 2018-09-08 NOTE — Telephone Encounter (Signed)
Patient's daughter, [name], is requesting to speak with PCP regarding patient's arthritis. Daughter stated that the patient is having an arthritis flare up and he cannot move his hands much and has pain. Please advise.    Daughter can be reached at [phone]    Thank you

## 2018-09-11 ENCOUNTER — Encounter (INDEPENDENT_AMBULATORY_CARE_PROVIDER_SITE_OTHER): Payer: Self-pay

## 2018-09-12 ENCOUNTER — Encounter (INDEPENDENT_AMBULATORY_CARE_PROVIDER_SITE_OTHER): Payer: Self-pay

## 2018-09-12 NOTE — Telephone Encounter (Signed)
Spoke with daughter should contact his rheumatologist as he ahs pain with his joints. If unable to reach or any issue can call back discussed.

## 2018-09-18 ENCOUNTER — Telehealth (INDEPENDENT_AMBULATORY_CARE_PROVIDER_SITE_OTHER): Payer: Self-pay | Admitting: Family Medicine

## 2018-09-18 NOTE — Telephone Encounter (Signed)
Error/disregard

## 2018-09-19 ENCOUNTER — Ambulatory Visit (INDEPENDENT_AMBULATORY_CARE_PROVIDER_SITE_OTHER): Payer: Medicare Other | Admitting: Family Medicine

## 2018-09-19 ENCOUNTER — Telehealth (INDEPENDENT_AMBULATORY_CARE_PROVIDER_SITE_OTHER): Payer: Self-pay | Admitting: Family Medicine

## 2018-09-19 ENCOUNTER — Encounter (INDEPENDENT_AMBULATORY_CARE_PROVIDER_SITE_OTHER): Payer: Self-pay

## 2018-10-02 ENCOUNTER — Ambulatory Visit
Admission: RE | Admit: 2018-10-02 | Discharge: 2018-10-02 | Disposition: A | Discharge status: Home / Self Care | Payer: Medicare Other | Source: Ambulatory Visit | Attending: Internal Medicine | Admitting: Internal Medicine

## 2018-10-02 ENCOUNTER — Other Ambulatory Visit: Payer: Self-pay | Admitting: Internal Medicine

## 2018-10-02 DIAGNOSIS — M25512 Pain in left shoulder: Secondary | ICD-10-CM | POA: Insufficient documentation

## 2018-10-02 DIAGNOSIS — M064 Inflammatory polyarthropathy: Secondary | ICD-10-CM

## 2018-10-02 DIAGNOSIS — M25511 Pain in right shoulder: Secondary | ICD-10-CM

## 2018-10-03 ENCOUNTER — Other Ambulatory Visit (INDEPENDENT_AMBULATORY_CARE_PROVIDER_SITE_OTHER): Payer: Self-pay | Admitting: Family Medicine

## 2018-10-03 NOTE — Telephone Encounter (Signed)
Last Encounter:  06/08/2018  Next Appointment: none    Last Primary Care Labs:   Lab Results   Component Value Date    WBC 10.52 (H) 04/28/2018    HGB 9.4 (L) 04/28/2018    HCT 28.7 (L) 04/28/2018    PLT 142 04/28/2018    CHOL 113 (L) 05/31/2006    TRIG 125 05/31/2006    LDL 45 (L) 05/31/2006    ALT 9 04/26/2018    AST 26 04/26/2018    NA 136 04/28/2018    K 4.1 04/28/2018    CL 106 04/28/2018    CREAT 2.1 (H) 04/28/2018    BUN 58.1 (H) 04/28/2018    CO2 22 04/28/2018    TSH 2.54 06/02/2017    PSA 1.0 01/12/2006    INR 1.1 04/26/2018    GLU 259 (H) 04/28/2018    HGBA1C 6.2 (H) 04/11/2018

## 2018-10-04 ENCOUNTER — Other Ambulatory Visit (INDEPENDENT_AMBULATORY_CARE_PROVIDER_SITE_OTHER): Payer: Self-pay | Admitting: Family Medicine

## 2018-10-04 MED ORDER — METOPROLOL SUCCINATE ER 25 MG PO TB24
ORAL_TABLET | ORAL | 0 refills | Status: DC
Start: 2018-10-04 — End: 2018-11-06

## 2018-10-04 MED ORDER — PANTOPRAZOLE SODIUM 40 MG PO TBEC
40.00 mg | DELAYED_RELEASE_TABLET | Freq: Every evening | ORAL | 0 refills | Status: DC
Start: 2018-10-04 — End: 2018-12-25

## 2018-10-07 ENCOUNTER — Telehealth (INDEPENDENT_AMBULATORY_CARE_PROVIDER_SITE_OTHER): Payer: Self-pay | Admitting: Family Medicine

## 2018-10-07 MED ORDER — ONDANSETRON 4 MG PO TBDP
4.00 mg | ORAL_TABLET | Freq: Two times a day (BID) | ORAL | 0 refills | Status: DC | PRN
Start: 2018-10-07 — End: 2018-12-25

## 2018-10-07 NOTE — Telephone Encounter (Signed)
Noted on call MD note. Please call pt daughter. No c/o vomiting previously reported. Check on pt. Need fu if having complaints. Pt not seen since march. Need DM fu appt. Set appt -video visit atleast

## 2018-10-07 NOTE — Telephone Encounter (Signed)
ON CALL NOTE:    Called regarding patient who had been having onset of vomiting (3-4 times)- the day prior and diarrhea today. No blood in the stool nor vomitus. No fever nor chills. No known sick contacts. Daughter indicates this happens maybe once every month to every other month. Has not been addressed by her PCP.   He has a history of DM- Blood sugars (random) are in the 150's and otherwise had been well controlled , arthritic pain and gout.  He is in significant pain with problems with ambulation. He recently completed a course of steroids.    History and medications reviewed - I have advised daughter low threshold  regarding Er OR UC visit- red flags associated with vomiting and diarrhea reviewed.  Encouraged UC or ER visit for evaluation should symptoms persist- and /or  Symptoms of dehydration; inability to keep any food or fluids down; blood in stool or vomitus; abdominal pain etc.   Recommend start Pepcid 20 mg and Zofran as needed.    Follow up with PCP in the upcoming week

## 2018-10-10 NOTE — Telephone Encounter (Signed)
Spoke with pts daughter.  States he is no longer vomiting and feels better.  States she will call back to schedule a video visit.  She is currently driving.

## 2018-10-11 ENCOUNTER — Encounter (INDEPENDENT_AMBULATORY_CARE_PROVIDER_SITE_OTHER): Payer: Self-pay

## 2018-10-12 ENCOUNTER — Encounter (INDEPENDENT_AMBULATORY_CARE_PROVIDER_SITE_OTHER): Payer: Self-pay

## 2018-10-16 ENCOUNTER — Encounter (INDEPENDENT_AMBULATORY_CARE_PROVIDER_SITE_OTHER): Payer: Self-pay | Admitting: Family Medicine

## 2018-11-06 ENCOUNTER — Other Ambulatory Visit (INDEPENDENT_AMBULATORY_CARE_PROVIDER_SITE_OTHER): Payer: Self-pay | Admitting: Family Medicine

## 2018-11-06 DIAGNOSIS — M545 Low back pain, unspecified: Secondary | ICD-10-CM

## 2018-11-06 DIAGNOSIS — M170 Bilateral primary osteoarthritis of knee: Secondary | ICD-10-CM

## 2018-11-06 DIAGNOSIS — I509 Heart failure, unspecified: Secondary | ICD-10-CM

## 2018-11-06 NOTE — Telephone Encounter (Addendum)
Last office Visit: 06/2018    Future visits: None    Last refill: 10/04/18 metoprolol  # of tabs given: 30  # of refills: 0    Last refill: 07/31/18 Gabapentin  # of tabs given: 90  # of refills: 2    Last refill: 01/10/18 Lasix GIVEN BY ANOTHER MD  # of tabs given: 30  # of refills: 0    REFILL REQUEST ALSO FOR TRAJENTA BUT I DO NOT SEE IT ON THE MED LIST        Last labs: 04/2018

## 2018-11-07 ENCOUNTER — Other Ambulatory Visit (INDEPENDENT_AMBULATORY_CARE_PROVIDER_SITE_OTHER): Payer: Self-pay | Admitting: Family Medicine

## 2018-11-07 DIAGNOSIS — E1122 Type 2 diabetes mellitus with diabetic chronic kidney disease: Secondary | ICD-10-CM

## 2018-11-07 MED ORDER — GABAPENTIN 100 MG PO CAPS
ORAL_CAPSULE | ORAL | 2 refills | Status: DC
Start: 2018-11-07 — End: 2018-11-07

## 2018-11-07 MED ORDER — METOPROLOL SUCCINATE ER 25 MG PO TB24
ORAL_TABLET | ORAL | 5 refills | Status: DC
Start: 2018-11-07 — End: 2018-12-28

## 2018-11-07 MED ORDER — GABAPENTIN 100 MG PO CAPS
ORAL_CAPSULE | ORAL | 3 refills | Status: DC
Start: 2018-11-07 — End: 2018-12-13

## 2018-11-07 MED ORDER — FUROSEMIDE 40 MG PO TABS
40.0000 mg | ORAL_TABLET | Freq: Every day | ORAL | 2 refills | Status: DC
Start: 2018-11-07 — End: 2018-12-25

## 2018-11-08 MED ORDER — LANTUS SOLOSTAR 100 UNIT/ML SC SOPN
25.00 [IU] | PEN_INJECTOR | Freq: Every evening | SUBCUTANEOUS | 0 refills | Status: DC
Start: 2018-11-08 — End: 2018-12-28

## 2018-11-11 ENCOUNTER — Encounter (INDEPENDENT_AMBULATORY_CARE_PROVIDER_SITE_OTHER): Payer: Self-pay

## 2018-11-12 ENCOUNTER — Encounter (INDEPENDENT_AMBULATORY_CARE_PROVIDER_SITE_OTHER): Payer: Self-pay

## 2018-12-12 ENCOUNTER — Encounter (INDEPENDENT_AMBULATORY_CARE_PROVIDER_SITE_OTHER): Payer: Self-pay

## 2018-12-13 ENCOUNTER — Other Ambulatory Visit (INDEPENDENT_AMBULATORY_CARE_PROVIDER_SITE_OTHER): Payer: Self-pay | Admitting: Family Medicine

## 2018-12-13 ENCOUNTER — Encounter (INDEPENDENT_AMBULATORY_CARE_PROVIDER_SITE_OTHER): Payer: Self-pay

## 2018-12-13 DIAGNOSIS — M79605 Pain in left leg: Secondary | ICD-10-CM

## 2018-12-13 DIAGNOSIS — M170 Bilateral primary osteoarthritis of knee: Secondary | ICD-10-CM

## 2018-12-13 NOTE — Telephone Encounter (Signed)
Last OV 06/2018-need video visit DM fu ASAP-30 minute appt always

## 2018-12-15 NOTE — Telephone Encounter (Signed)
lvm advising pt to schedule f/u

## 2018-12-18 ENCOUNTER — Other Ambulatory Visit (INDEPENDENT_AMBULATORY_CARE_PROVIDER_SITE_OTHER): Payer: Self-pay | Admitting: Family Medicine

## 2018-12-18 DIAGNOSIS — M79604 Pain in right leg: Secondary | ICD-10-CM

## 2018-12-18 DIAGNOSIS — Z794 Long term (current) use of insulin: Secondary | ICD-10-CM

## 2018-12-18 DIAGNOSIS — M170 Bilateral primary osteoarthritis of knee: Secondary | ICD-10-CM

## 2018-12-18 NOTE — Telephone Encounter (Signed)
LOV: 06/08/2018

## 2018-12-19 MED ORDER — LINAGLIPTIN 5 MG PO TABS
5.0000 mg | ORAL_TABLET | Freq: Every day | ORAL | 1 refills | Status: DC
Start: 2018-12-19 — End: 2018-12-28

## 2018-12-19 NOTE — Telephone Encounter (Signed)
Pt need seen. Not seen in a long time and is insulin dependent DM. Do video visit ASAP

## 2018-12-19 NOTE — Telephone Encounter (Signed)
Left patient vm to set up appt.

## 2018-12-25 ENCOUNTER — Inpatient Hospital Stay
Admission: EM | Admit: 2018-12-25 | Discharge: 2018-12-28 | DRG: 638 | Disposition: A | Discharge status: Home Health | Payer: 59 | Attending: Internal Medicine | Admitting: Internal Medicine

## 2018-12-25 ENCOUNTER — Emergency Department: Payer: 59

## 2018-12-25 DIAGNOSIS — I5042 Chronic combined systolic (congestive) and diastolic (congestive) heart failure: Secondary | ICD-10-CM | POA: Diagnosis present

## 2018-12-25 DIAGNOSIS — I10 Essential (primary) hypertension: Secondary | ICD-10-CM | POA: Diagnosis present

## 2018-12-25 DIAGNOSIS — Z794 Long term (current) use of insulin: Secondary | ICD-10-CM

## 2018-12-25 DIAGNOSIS — E11649 Type 2 diabetes mellitus with hypoglycemia without coma: Principal | ICD-10-CM | POA: Diagnosis present

## 2018-12-25 DIAGNOSIS — IMO0001 Reserved for inherently not codable concepts without codable children: Secondary | ICD-10-CM

## 2018-12-25 DIAGNOSIS — E871 Hypo-osmolality and hyponatremia: Secondary | ICD-10-CM | POA: Diagnosis present

## 2018-12-25 DIAGNOSIS — I2510 Atherosclerotic heart disease of native coronary artery without angina pectoris: Secondary | ICD-10-CM | POA: Diagnosis present

## 2018-12-25 DIAGNOSIS — M353 Polymyalgia rheumatica: Secondary | ICD-10-CM | POA: Diagnosis present

## 2018-12-25 DIAGNOSIS — Z79899 Other long term (current) drug therapy: Secondary | ICD-10-CM

## 2018-12-25 DIAGNOSIS — F0390 Unspecified dementia without behavioral disturbance: Secondary | ICD-10-CM | POA: Diagnosis present

## 2018-12-25 DIAGNOSIS — I509 Heart failure, unspecified: Secondary | ICD-10-CM

## 2018-12-25 DIAGNOSIS — R32 Unspecified urinary incontinence: Secondary | ICD-10-CM | POA: Diagnosis present

## 2018-12-25 DIAGNOSIS — E1122 Type 2 diabetes mellitus with diabetic chronic kidney disease: Secondary | ICD-10-CM | POA: Diagnosis present

## 2018-12-25 DIAGNOSIS — N184 Chronic kidney disease, stage 4 (severe): Secondary | ICD-10-CM | POA: Diagnosis present

## 2018-12-25 DIAGNOSIS — N179 Acute kidney failure, unspecified: Secondary | ICD-10-CM | POA: Diagnosis present

## 2018-12-25 DIAGNOSIS — I252 Old myocardial infarction: Secondary | ICD-10-CM

## 2018-12-25 DIAGNOSIS — Z950 Presence of cardiac pacemaker: Secondary | ICD-10-CM

## 2018-12-25 DIAGNOSIS — D631 Anemia in chronic kidney disease: Secondary | ICD-10-CM | POA: Diagnosis present

## 2018-12-25 DIAGNOSIS — I130 Hypertensive heart and chronic kidney disease with heart failure and stage 1 through stage 4 chronic kidney disease, or unspecified chronic kidney disease: Secondary | ICD-10-CM | POA: Diagnosis present

## 2018-12-25 DIAGNOSIS — R159 Full incontinence of feces: Secondary | ICD-10-CM | POA: Diagnosis present

## 2018-12-25 DIAGNOSIS — M1A9XX0 Chronic gout, unspecified, without tophus (tophi): Secondary | ICD-10-CM | POA: Diagnosis present

## 2018-12-25 DIAGNOSIS — Z7401 Bed confinement status: Secondary | ICD-10-CM

## 2018-12-25 DIAGNOSIS — R7989 Other specified abnormal findings of blood chemistry: Secondary | ICD-10-CM | POA: Diagnosis present

## 2018-12-25 DIAGNOSIS — Z20828 Contact with and (suspected) exposure to other viral communicable diseases: Secondary | ICD-10-CM | POA: Diagnosis present

## 2018-12-25 DIAGNOSIS — Z7982 Long term (current) use of aspirin: Secondary | ICD-10-CM

## 2018-12-25 DIAGNOSIS — E162 Hypoglycemia, unspecified: Secondary | ICD-10-CM | POA: Diagnosis present

## 2018-12-25 DIAGNOSIS — E782 Mixed hyperlipidemia: Secondary | ICD-10-CM | POA: Diagnosis present

## 2018-12-25 DIAGNOSIS — R4182 Altered mental status, unspecified: Secondary | ICD-10-CM | POA: Diagnosis present

## 2018-12-25 DIAGNOSIS — E119 Type 2 diabetes mellitus without complications: Secondary | ICD-10-CM

## 2018-12-25 LAB — CBC AND DIFFERENTIAL
Absolute NRBC: 0 10*3/uL (ref 0.00–0.00)
Hematocrit: 29.6 % — ABNORMAL LOW (ref 37.6–49.6)
Hgb: 10 g/dL — ABNORMAL LOW (ref 12.5–17.1)
MCH: 31 pg (ref 25.1–33.5)
MCHC: 33.8 g/dL (ref 31.5–35.8)
MCV: 91.6 fL (ref 78.0–96.0)
MPV: 9.2 fL (ref 8.9–12.5)
Nucleated RBC: 0 /100 WBC (ref 0.0–0.0)
Platelets: 207 10*3/uL (ref 142–346)
RBC: 3.23 10*6/uL — ABNORMAL LOW (ref 4.20–5.90)
RDW: 15 % (ref 11–15)
WBC: 9.57 10*3/uL — ABNORMAL HIGH (ref 3.10–9.50)

## 2018-12-25 LAB — GLUCOSE WHOLE BLOOD - POCT
Whole Blood Glucose POCT: 43 mg/dL — CL (ref 70–100)
Whole Blood Glucose POCT: 73 mg/dL (ref 70–100)
Whole Blood Glucose POCT: 39 mg/dL — CL (ref 70–100)
Whole Blood Glucose POCT: 131 mg/dL — ABNORMAL HIGH (ref 70–100)
Whole Blood Glucose POCT: 112 mg/dL — ABNORMAL HIGH (ref 70–100)
Whole Blood Glucose POCT: 126 mg/dL — ABNORMAL HIGH (ref 70–100)
Whole Blood Glucose POCT: 146 mg/dL — ABNORMAL HIGH (ref 70–100)
Whole Blood Glucose POCT: 157 mg/dL — ABNORMAL HIGH (ref 70–100)

## 2018-12-25 LAB — B-TYPE NATRIURETIC PEPTIDE: B-Natriuretic Peptide: 2143 pg/mL — ABNORMAL HIGH (ref 0.0–100.0)

## 2018-12-25 LAB — COMPREHENSIVE METABOLIC PANEL
ALT: 6 U/L (ref 0–55)
AST (SGOT): 21 U/L (ref 5–34)
Albumin/Globulin Ratio: 1.5 (ref 0.9–2.2)
Albumin: 3.4 g/dL — ABNORMAL LOW (ref 3.5–5.0)
Alkaline Phosphatase: 46 U/L (ref 38–106)
Anion Gap: 9 (ref 5.0–15.0)
BUN: 21 mg/dL (ref 9.0–28.0)
Bilirubin, Total: 0.3 mg/dL (ref 0.2–1.2)
CO2: 21 mEq/L — ABNORMAL LOW (ref 22–29)
Calcium: 8.5 mg/dL (ref 7.9–10.2)
Chloride: 98 mEq/L — ABNORMAL LOW (ref 100–111)
Creatinine: 1.7 mg/dL — ABNORMAL HIGH (ref 0.7–1.3)
Globulin: 2.3 g/dL (ref 2.0–3.6)
Glucose: 45 mg/dL — CL (ref 70–100)
Potassium: 4 mEq/L (ref 3.5–5.1)
Protein, Total: 5.7 g/dL — ABNORMAL LOW (ref 6.0–8.3)
Sodium: 128 mEq/L — ABNORMAL LOW (ref 136–145)

## 2018-12-25 LAB — MAN DIFF ONLY
Band Neutrophils Absolute: 0.1 10*3/uL (ref 0.00–1.00)
Band Neutrophils: 1 %
Basophils Absolute Manual: 0 10*3/uL (ref 0.00–0.08)
Basophils Manual: 0 %
Eosinophils Absolute Manual: 2.3 10*3/uL — ABNORMAL HIGH (ref 0.00–0.44)
Eosinophils Manual: 24 %
Lymphocytes Absolute Manual: 0.77 10*3/uL (ref 0.42–3.22)
Lymphocytes Manual: 8 %
Monocytes Absolute: 0.1 10*3/uL — ABNORMAL LOW (ref 0.21–0.85)
Monocytes Manual: 1 %
Neutrophils Absolute Manual: 6.32 10*3/uL (ref 1.10–6.33)
Segmented Neutrophils: 66 %

## 2018-12-25 LAB — GFR: EGFR: 38

## 2018-12-25 LAB — CELL MORPHOLOGY
Cell Morphology: NORMAL
Platelet Estimate: NORMAL

## 2018-12-25 LAB — TROPONIN I: Troponin I: 0.05 ng/mL (ref 0.00–0.05)

## 2018-12-25 MED ORDER — DEXTROSE 50 % IV SOLN
INTRAVENOUS | Status: AC
Start: 2018-12-25 — End: 2018-12-25
  Filled 2018-12-25: qty 50

## 2018-12-25 MED ORDER — METOPROLOL SUCCINATE ER 25 MG PO TB24
25.00 mg | ORAL_TABLET | Freq: Every day | ORAL | Status: DC
Start: 2018-12-25 — End: 2018-12-28
  Administered 2018-12-25 – 2018-12-27 (×3): 25 mg via ORAL
  Filled 2018-12-25 (×4): qty 1

## 2018-12-25 MED ORDER — DEXTROSE 10 % IV SOLN
INTRAVENOUS | Status: DC
Start: 2018-12-25 — End: 2018-12-26

## 2018-12-25 MED ORDER — GABAPENTIN 100 MG PO CAPS
100.00 mg | ORAL_CAPSULE | Freq: Three times a day (TID) | ORAL | Status: DC
Start: 2018-12-25 — End: 2018-12-28
  Administered 2018-12-25 – 2018-12-28 (×8): 100 mg via ORAL
  Filled 2018-12-25 (×8): qty 1

## 2018-12-25 MED ORDER — GLUCAGON 1 MG IJ SOLR (WRAP)
INTRAMUSCULAR | Status: AC
Start: 2018-12-25 — End: 2018-12-25
  Filled 2018-12-25: qty 1

## 2018-12-25 MED ORDER — ALLOPURINOL 100 MG PO TABS
100.0000 mg | ORAL_TABLET | Freq: Every day | ORAL | Status: DC
Start: 2018-12-25 — End: 2018-12-28
  Administered 2018-12-25 – 2018-12-27 (×3): 100 mg via ORAL
  Filled 2018-12-25 (×4): qty 1

## 2018-12-25 MED ORDER — METHYLPREDNISOLONE SODIUM SUCC 125 MG IJ SOLR
125.00 mg | Freq: Once | INTRAMUSCULAR | Status: AC
Start: 2018-12-25 — End: 2018-12-25
  Administered 2018-12-25: 12:00:00 125 mg via INTRAVENOUS
  Filled 2018-12-25: qty 2

## 2018-12-25 MED ORDER — DEXTROSE 50 % IV SOLN
25.00 mL | Freq: Once | INTRAVENOUS | Status: AC
Start: 2018-12-25 — End: 2018-12-25
  Administered 2018-12-25: 15:00:00 25 mL via INTRAVENOUS
  Filled 2018-12-25: qty 50

## 2018-12-25 MED ORDER — ALBUTEROL SULFATE (2.5 MG/3ML) 0.083% IN NEBU
2.50 mg | INHALATION_SOLUTION | Freq: Once | RESPIRATORY_TRACT | Status: AC
Start: 2018-12-25 — End: 2018-12-25
  Administered 2018-12-25: 12:00:00 2.5 mg via RESPIRATORY_TRACT
  Filled 2018-12-25: qty 3

## 2018-12-25 MED ORDER — DEXTROSE 50 % IV SOLN
25.00 mL | Freq: Once | INTRAVENOUS | Status: AC
Start: 2018-12-25 — End: 2018-12-25
  Administered 2018-12-25: 12:00:00 25 mL via INTRAVENOUS
  Filled 2018-12-25: qty 50

## 2018-12-25 MED ORDER — DEXTROSE-NACL 5-0.9 % IV SOLN
100.00 mL/h | INTRAVENOUS | Status: DC
Start: 2018-12-25 — End: 2018-12-25

## 2018-12-25 MED ORDER — ASPIRIN 81 MG PO CHEW
81.00 mg | CHEWABLE_TABLET | Freq: Every day | ORAL | Status: DC
Start: 2018-12-25 — End: 2018-12-28
  Administered 2018-12-25 – 2018-12-27 (×3): 81 mg via ORAL
  Filled 2018-12-25 (×4): qty 1

## 2018-12-25 NOTE — H&P (Signed)
[facility])      ADMISSION- HISTORY AND PHYSICAL EXAM        Date Time: 12/25/18 4:12 PM  Patient Name: Marco Bruce  Attending Physician: Almyra Brace, DO  Room: 21/21   Admit Date: 12/25/2018  LOS: 0 days      Chief Compliant:AMS  Assessment:   Problem List: Active Problems:    * No active hospital problems. *        Plan:   Critical Care support by organ system:  PULM:  -Stable    CV:  -Systolic (15-72%) and diastolic congestive heart failure: Last echo on 10/19.  BNP is elevated but does not have any evidence of CHF exacerbation.  -Coronary artery disease  -History of heart block, status post pacemaker placement in 2017.  Maintain MAP between 60-65 mmHg.    Infectious Disease (ID):  -We will rule out COVID    Renal /Fluid, Electrolytes :  -Chronic kidney disease:  -History of gout  -Mild hyponatremia:  Would continue close monitoring of I/O.   Replace electrolytes as needed.     GI/Nutrition:  -Start diet when more awake.   Aspiration precaution.    Hem/Onc:  -Anemia:  We will monitor the hemoglobin.     Endocrine/Rheumatology :  -Persistent hypoglycemia: Patient is diabetic and is on Lantus 25 units daily.Start patient on D10 and monitor blood sugar every 2 hours  -History of polymyalgia rheumatica:    Neuro:  -Altered mental status secondary to hypoglycemia    Prophylaxis:    GI Prophylaxis:  VTE Prophylaxis:+    IV Access:PIV  Foley Catheter:    Code Status: No CPR   Patient is currently is not able to make medical decisions.    History was taken from his grandson.     I have personally reviewed the patients history and 24 hour interval events, along with vitals, labs, radiology images and ventilator settings and additional findings found in detail within [facility] team notes from house staff, NPPs and nursing, with their care plans developed with and reviewed by me.     Disposition:     Consultants:      History of Present Illness:   Marco Bruce is a 85 y.o. male  who presents to the hospital with history of diabetes mellitus, insulin-dependent was brought to the hospital for being unresponsive today.  The history was taken from his grandson who was present during the interview.  Since yesterday patient is having altered mental status with intermittent confusion and today was totally unresponsive and EMS was called.  He was found to be hypoglycemic by the EMS and glucagon was injected intramuscularly.  In the ER his blood sugar improved from initial 27-70 and then dropped again to 40.  Patient has started on D10.  According to his grandson patient had no cold with exposure.  He is bedbound and lives at home with his daughter.  He has chronic cough for the past 3 years.      Past Medical History:     Past Medical History:   Diagnosis Date    Acute diastolic heart failure 62/06/5595    Acute on chronic diastolic congestive heart failure 08/13/2016    Acute pulmonary edema 05/26/2015    Chronic diastolic heart failure 41/63/8453    Chronic gout     Chronic pain syndrome 07/07/2015    CKD (chronic kidney disease) stage 4, GFR 15-29 ml/min     2018 baseline creatinine  in EPIC is 1.5-2.3.    Congestive heart failure (CHF)     EF 40% 5/18 echo, on lasix    Current chronic use of systemic steroids     For Polymyalgia rheumatica    Essential hypertension     Mixed hyperlipidemia     NSTEMI (non-ST elevated myocardial infarction) 08/13/2016    Osteoarthritis of both knees     Peripheral edema 06/16/2015    Pneumonia due to infectious organism, unspecified laterality, unspecified part of lung 06/25/2015    Polymyalgia rheumatica     chronic steroids    Renovascular hypertension 08/04/2015    Second degree heart block by electrocardiogram (ECG) 04/27/2015    Type 2 diabetes mellitus with complication, with long-term current use of insulin     12/01/2016 Hemoglobin A1c 8.2%       Past Surgical History:     Past Surgical History:   Procedure Laterality Date    ABDOMINAL  SURGERY      CHOLECYSTECTOMY      PACEMAKER Left 2017       Family History:     Family History   Problem Relation Age of Onset    Hypertension Father        Social History:     Social History     Socioeconomic History    Marital status: Widowed     Spouse name: Not on file    Number of children: Not on file    Years of education: Not on file    Highest education level: Not on file   Occupational History    Not on file   Social Needs    Financial resource strain: Not on file    Food insecurity     Worry: Not on file     Inability: Not on file    Transportation needs     Medical: Not on file     Non-medical: Not on file   Tobacco Use    Smoking status: Never Smoker    Smokeless tobacco: Never Used   Substance and Sexual Activity    Alcohol use: No     Comment: former alcoholic    Drug use: No    Sexual activity: Not on file   Lifestyle    Physical activity     Days per week: Not on file     Minutes per session: Not on file    Stress: Not on file   Relationships    Social connections     Talks on phone: Not on file     Gets together: Not on file     Attends religious service: Not on file     Active member of club or organization: Not on file     Attends meetings of clubs or organizations: Not on file     Relationship status: Not on file    Intimate partner violence     Fear of current or ex partner: Not on file     Emotionally abused: Not on file     Physically abused: Not on file     Forced sexual activity: Not on file   Other Topics Concern    Not on file   Social History Narrative    Not on file       Allergies:   No Known Allergies    Home Medications:   (Not in a hospital admission)      Review of Systems:     Unobtainable.  Current Medications:   Scheduled Meds:  Current Facility-Administered Medications   Medication Dose Route Frequency     Continuous Infusions:   dextrose 50 mL/hr at 12/25/18 1534     PRN Meds:.      Physical Exam:   Vital signs for last 24 hours:  Heart Rate:  [63-80]  80  Resp Rate:  [13-21] 13  BP: (108-133)/(52-62) 108/55    I/O last 24 hours:  No intake/output data recorded.    I/O this shift:  No intake/output data recorded.    General: awake,lethargic   HEENT: dry mucosa,  no thrush.  Cardiovascular: regular rate and rhythm, no murmurs, rubs or gallops.   Lungs: clear to auscultation bilaterally, without wheezing in ant exam  Abdomen: soft, non-tender, non-distended;   Extremities: no clubbing, cyanosis, , trace  edema.  Neuro: moving extremities.       Data:     Labs:     Results     Procedure Component Value Units Date/Time    CULTURE BLOOD AEROBIC AND ANAEROBIC [[id number]] Collected: 12/25/18 1137    Specimen: Blood, Venipuncture Updated: 12/25/18 1528    Narrative:      The order will result in two separate 8-90ml bottles  Please do NOT order repeat blood cultures if one has been  drawn within the last 48 hours  UNLESS concerned for  endocarditis  AVOID BLOOD CULTURE DRAWS FROM CENTRAL LINE IF POSSIBLE  Indications:->Pneumonia  1 BLUE+1 PURPLE    CULTURE BLOOD AEROBIC AND ANAEROBIC [[id number]] Collected: 12/25/18 1137    Specimen: Blood, Venipuncture Updated: 12/25/18 1528    Narrative:      The order will result in two separate 8-6ml bottles  Please do NOT order repeat blood cultures if one has been  drawn within the last 48 hours  UNLESS concerned for  endocarditis  AVOID BLOOD CULTURE DRAWS FROM CENTRAL LINE IF POSSIBLE  Indications:->Bacteremia  Indications:->Pneumonia  1 BLUE+1 PURPLE    Glucose Whole Blood - POCT [[id number]]  (Abnormal) Collected: 12/25/18 1516     Updated: 12/25/18 1521     Whole Blood Glucose POCT 39 mg/dL     Glucose Whole Blood - POCT [[id number]] Collected: 12/25/18 1303     Updated: 12/25/18 1306     Whole Blood Glucose POCT 73 mg/dL     B-type Natriuretic Peptide [[id number]]  (Abnormal) Collected: 12/25/18 1137    Specimen: Blood Updated: 12/25/18 1242     B-Natriuretic Peptide 2,143.0 pg/mL     GFR [[id number]] Collected: 12/25/18 1137      Updated: 12/25/18 1223     EGFR 38.0    Comprehensive metabolic panel [[id number]]  (Abnormal) Collected: 12/25/18 1137    Specimen: Blood Updated: 12/25/18 1223     Glucose 45 mg/dL      BUN 21.0 mg/dL      Creatinine 1.7 mg/dL      Sodium 128 mEq/L      Potassium 4.0 mEq/L      Chloride 98 mEq/L      CO2 21 mEq/L      Calcium 8.5 mg/dL      Protein, Total 5.7 g/dL      Albumin 3.4 g/dL      AST (SGOT) 21 U/L      ALT 6 U/L      Alkaline Phosphatase 46 U/L      Bilirubin, Total 0.3 mg/dL      Globulin 2.3 g/dL      Albumin/Globulin Ratio 1.5  Anion Gap 9.0    CBC and differential [[id number]]  (Abnormal) Collected: 12/25/18 1137    Specimen: Blood Updated: 12/25/18 1215     WBC 9.57 x10 3/uL      Hgb 10.0 g/dL      Hematocrit 29.6 %      Platelets 207 x10 3/uL      RBC 3.23 x10 6/uL      MCV 91.6 fL      MCH 31.0 pg      MCHC 33.8 g/dL      RDW 15 %      MPV 9.2 fL      Nucleated RBC 0.0 /100 WBC      Absolute NRBC 0.00 x10 3/uL     Cell MorpHology [[id number]] Collected: 12/25/18 1137     Updated: 12/25/18 1215     Cell Morphology Normal     Platelet Estimate Normal    Manual Differential [[id number]]  (Abnormal) Collected: 12/25/18 1137     Updated: 12/25/18 1215     Segmented Neutrophils 66 %      Band Neutrophils 1 %      Lymphocytes Manual 8 %      Monocytes Manual 1 %      Eosinophils Manual 24 %      Basophils Manual 0 %      Neutrophils Absolute Manual 6.32 x10 3/uL      Band Neutrophils Absolute 0.10 x10 3/uL      Lymphocytes Absolute Manual 0.77 x10 3/uL      Monocytes Absolute 0.10 x10 3/uL      Eosinophils Absolute Manual 2.30 x10 3/uL      Basophils Absolute Manual 0.00 x10 3/uL     Troponin I [[id number]] Collected: 12/25/18 1137    Specimen: Blood Updated: 12/25/18 1211     Troponin I 0.05 ng/mL     Glucose Whole Blood - POCT [[id number]]  (Abnormal) Collected: 12/25/18 1134     Updated: 12/25/18 1136     Whole Blood Glucose POCT 43 mg/dL         Recent Labs     12/25/18  1137   Sodium 128*   Potassium  4.0   Chloride 98*   CO2 21*   BUN 21.0   Creatinine 1.7*   Glucose 45*   Calcium 8.5     Recent Labs   Lab 12/25/18  1137   WBC 9.57*   Hgb 10.0*   Hematocrit 29.6*   Platelets 207     Recent Labs   Lab 12/25/18  1137   ALT 6   AST (SGOT) 21   Bilirubin, Total 0.3             Recent Labs   Lab 12/25/18  1137   Troponin I 0.05         ABG:   No results found for: PH      Rads:     Radiology Results (24 Hour)     Procedure Component Value Units Date/Time    Chest AP Only [[id number]] Collected: 12/25/18 1214    Order Status: Completed Updated: 12/25/18 1217    Narrative:      HISTORY: Cough    Portable AP view of the chest shows pacemaker generator is seen in left  chest wall. Mild pulmonary vascular congestion is present..   Cardiomediastinal silhouette is mildly enlarged.  No focal bony lesion  is seen.      Impression:  Mild cardiomegaly and pulmonary vascular congestion    J. Theresia Majors, MD   12/25/2018 12:15 PM          This patient has a high probability of sudden clinically significant deterioration which requires the highest level of physician preparedness to intervene urgently. I managed/supervised life or organ supporting interventions that required frequent physician assessments. I devoted my full attention in the [place]  to the direct care of this patient for this period of time.  Organ systems require intensive critical care support  (and are described in more detail in the note assessment and plan above) .     Any critical care time was performed today and is exclusive of teaching, billable procedures, and not overlapping with any other providers.     Critical care time: 40 minutes.        Signed by: Waynetta Sandy, MD  12/25/2018 4:12 PM  AE:[id number], Philis Nettle, MD

## 2018-12-25 NOTE — Plan of Care (Signed)
Patient arrived from ED, planned covid isolation, awake alert, skin intact, VSS NC 2L D10 running bg check 125, CHG bath, nonconversational at this time r/t language barrier, report given to nightshift RN Rahina.   Family aware of transfer per ED report

## 2018-12-25 NOTE — ED Triage Notes (Signed)
Pt found unresponsive by family this morning with BG of 27.   Given glucagon IM by EMS pta  Which brought him to 47- awake and alert- conversing with daughter in [place].    +cough, lots of phlegm

## 2018-12-25 NOTE — ED Notes (Signed)
[facility] [place]  ED NURSING NOTE FOR THE RECEIVING INPATIENT NURSE   ED NURSE Hamilton Capri [phone]   ED CHARGE RN Melissa   ADMISSION INFORMATION   Marco Bruce is a 85 y.o. male admitted with an ED diagnosis of:    No diagnosis found.     Isolation: Airborne, Droplet   Allergies: Patient has no known allergies.   Holding Orders confirmed? N/A   Belongings Documented? N/A   Home medications sent to pharmacy confirmed? N/A   NURSING CARE   Patient Comes From:   Mental Status: Home/Family Care  alert   ADL: Dependent with ADLs   Ambulation: bedbound   Pertinent Information  and Safety Concerns: family stays with patient     CT / NIH   CT Head ordered on this patient?  N/A   NIH/Dysphagia assessment done prior to admission? N/A   VITAL SIGNS (at the time of this note)      Vitals:    12/25/18 1300   BP: 118/60   Pulse: 76   Resp: 14   SpO2: 96%

## 2018-12-25 NOTE — UM Notes (Signed)
[facility] Utilization Review   NPI [id number], Tax ID [id number]  Fax final authorization and requests for additional information to [phone]    12/25/18      History of present illness: Pt is a 85 y.o. male who arrived to the ER (12/25/2018 at 1055) with C/OHypoglycemia    DIAGNOSIS:     ICD-10-CM    1. Hypoglycemia  E16.2    2. Altered mental status, unspecified altered mental status type  R41.82    3. Acute renal failure, unspecified acute renal failure type  N17.9    4. Congestive heart failure, unspecified HF chronicity, unspecified heart failure type  I50.9    5. Hyponatremia  E87.1        PATIENT NAME: Gasparini,Keno / 03/10/1927 / AGE: 85 y.o.  MRN#: [id number]  CSN#: [id number]   PAYOR:Payor: MEDICAID HMO / Plan: [place] PLUS / Product Type: MANAGED MEDICAID /        PMH:  has a past medical history of Acute diastolic heart failure (63/86/8548), Acute on chronic diastolic congestive heart failure (08/13/2016), Acute pulmonary edema (05/26/2015), Chronic diastolic heart failure (83/04/4157), Chronic gout, Chronic pain syndrome (07/07/2015), CKD (chronic kidney disease) stage 4, GFR 15-29 ml/min, Congestive heart failure (CHF), Current chronic use of systemic steroids, Essential hypertension, Mixed hyperlipidemia, NSTEMI (non-ST elevated myocardial infarction) (08/13/2016), Osteoarthritis of both knees, Peripheral edema (06/16/2015), Pneumonia due to infectious organism, unspecified laterality, unspecified part of lung (06/25/2015), Polymyalgia rheumatica, 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  PSH:  has a past surgical history that includes PACEMAKER (Left, 2017); Abdominal surgery; and Cholecystectomy.    ED TREATMENT  on 12/25/2018 10:55 AM    V/S:  Hr 80, rr 21, 138/62    Labs - wbc 9.57, hbg 10, hct 29.6, rbc 3.23, glucose 45, cr  1.7, na 128, chl 98, co2 21, protein 5.7, albumin 3.4, BNP 2143    Radiologic Studies -   Chest Ap Only    Result Date: 12/25/2018  Mild cardiomegaly and pulmonary vascular congestion J. Theresia Majors, MD  12/25/2018 12:15 PM       EKG:  Ventricular-paced rhythm   ABNORMAL ECG       ED meds:   Solu-medrol 125mg  IV  Albuterol 2.5mg  neb  D50% 49ml IV      Critical Care medicine H/P:  CV:  -Systolic (73-31%) and diastolic congestive heart failure: Last echo on 10/19.  BNP is elevated but does not have any evidence of CHF exacerbation.  -Coronary artery disease  -History of heart block, status post pacemaker placement in 2017.  Maintain MAP between 60-65 mmHg.    Infectious Disease (ID):  -We will rule out COVID    Renal /Fluid, Electrolytes :  -Chronic kidney disease:  -History of gout  -Mild hyponatremia:  Would continue close monitoring of I/O.   Replace electrolytes as needed.     GI/Nutrition:  -Start diet when more awake.   Aspiration precaution.    Hem/Onc:  -Anemia:  We will monitor the hemoglobin.     Endocrine/Rheumatology :  -Persistent hypoglycemia: Patient is diabetic and is on Lantus 25 units daily.Start patient on D10 and monitor blood sugar every 2 hours  -History of polymyalgia rheumatica:

## 2018-12-25 NOTE — ED Provider Notes (Addendum)
History     Chief Complaint   Patient presents with    Hypoglycemia     85 yo male with AMS at home- BS found to be very low and EMS gave glucagon IM which improved MS.  No recent illness, cough, or congestion but noted to be wheezing on arrival.    The history is provided by the patient. No language interpreter was used.   Hypoglycemia  Severity:  Mild  Onset quality:  Sudden  Timing:  Constant  Progression:  Unchanged  Chronicity:  New  Context: not ingestion and not recent illness    Relieved by:  Nothing  Associated symptoms: no anxiety, no dizziness and no shortness of breath         Past Medical History:   Diagnosis Date    Acute diastolic heart failure 32/44/0102    Acute on chronic diastolic congestive heart failure 08/13/2016    Acute pulmonary edema 05/26/2015    Chronic diastolic heart failure 72/53/6644    Chronic gout     Chronic pain syndrome 07/07/2015    CKD (chronic kidney disease) stage 4, GFR 15-29 ml/min     2018 baseline creatinine in EPIC is 1.5-2.3.    Congestive heart failure (CHF)     EF 40% 5/18 echo, on lasix    Current chronic use of systemic steroids     For Polymyalgia rheumatica    Essential hypertension     Mixed hyperlipidemia     NSTEMI (non-ST elevated myocardial infarction) 08/13/2016    Osteoarthritis of both knees     Peripheral edema 06/16/2015    Pneumonia due to infectious organism, unspecified laterality, unspecified part of lung 06/25/2015    Polymyalgia rheumatica     chronic steroids    Renovascular hypertension 08/04/2015    Second degree heart block by electrocardiogram (ECG) 04/27/2015    Type 2 diabetes mellitus with complication, with long-term current use of insulin     12/01/2016 Hemoglobin A1c 8.2%       Past Surgical History:   Procedure Laterality Date    ABDOMINAL SURGERY      CHOLECYSTECTOMY      PACEMAKER Left 2017       Family History   Problem Relation Age of Onset    Hypertension Father        Social  Social History     Tobacco  Use    Smoking status: Never Smoker    Smokeless tobacco: Never Used   Substance Use Topics    Alcohol use: No     Comment: former alcoholic    Drug use: No       .     No Known Allergies    Home Medications     Med List Status: Pharmacy Completed Set By: Servando Snare at 12/25/2018  1:21 PM                acetaminophen (TYLENOL) 325 MG tablet     Take 2 tablets (650 mg total) by mouth every 6 (six) hours     allopurinol (ZYLOPRIM) 100 MG tablet     Take 1 tablet (100 mg total) by mouth daily     aspirin 81 MG chewable tablet     Chew 1 tablet (81 mg total) by mouth daily     gabapentin (NEURONTIN) 100 MG capsule     TAKE ONE CAPSULE BY MOUTH EVERY 8 HOURS     insulin glargine (LANTUS SOLOSTAR) 100 UNIT/ML injection pen  Inject 25 Units into the skin nightly Check blood sugar prior to administration. Hold if BS <100.     lactobacillus/streptococcus (RISAQUAD) Cap     Take 1 capsule by mouth daily     linaGLIPtin 5 MG Tab     Take 1 tablet (5 mg total) by mouth daily     metoprolol succinate XL (TOPROL-XL) 25 MG 24 hr tablet     Hold for SBP less than 100 or HR less than 60 Do not crush or chew.     Patient taking differently: Take 25 mg by mouth daily Hold for SBP less than 100 or HR less than 60 Do not crush or chew.       predniSONE (DELTASONE) 5 MG tablet     Take 1 tablet (5 mg total) by mouth daily     Patient taking differently: Take 5 mg by mouth as needed (gout)                                            Review of Systems   Constitutional: Negative for activity change, chills and fever.   Eyes: Negative for photophobia and pain.   Respiratory: Negative for apnea and shortness of breath.    Cardiovascular: Negative for chest pain and palpitations.   Gastrointestinal: Negative for abdominal distention and abdominal pain.   Genitourinary: Negative for difficulty urinating, dysuria, flank pain and frequency.   Musculoskeletal: Negative for back pain, joint swelling, neck pain and neck stiffness.    Skin: Negative for color change and rash.   Allergic/Immunologic: Negative for environmental allergies.   Neurological: Negative for dizziness and headaches.   Psychiatric/Behavioral: Negative for agitation. The patient is not nervous/anxious.    All other systems reviewed and are negative.      Physical Exam    BP: 133/62, Heart Rate: 77, Resp Rate: 18, SpO2: 96 %, Weight: 66.3 kg    Physical Exam  Vitals signs and nursing note reviewed.   Constitutional:       General: He is not in acute distress.     Appearance: He is well-developed. He is not diaphoretic.   HENT:      Head: Normocephalic and atraumatic.   Eyes:      Conjunctiva/sclera: Conjunctivae normal.      Pupils: Pupils are equal, round, and reactive to light.   Neck:      Musculoskeletal: Normal range of motion and neck supple.      Vascular: No carotid bruit.   Cardiovascular:      Rate and Rhythm: Normal rate and regular rhythm.      Heart sounds: No murmur. No friction rub.   Pulmonary:      Effort: Pulmonary effort is normal. No respiratory distress.      Breath sounds: Normal breath sounds. No stridor. No wheezing.   Chest:      Chest wall: No tenderness.   Abdominal:      General: There is no distension.      Palpations: Abdomen is soft.      Tenderness: There is no abdominal tenderness. There is no guarding or rebound.   Musculoskeletal:         General: No tenderness.   Skin:     General: Skin is warm and dry.      Capillary Refill: Capillary refill takes less than 2 seconds.  Findings: No erythema.   Neurological:      Mental Status: He is alert and oriented to person, place, and time.      Cranial Nerves: No cranial nerve deficit.      Coordination: Coordination normal.   Psychiatric:         Behavior: Behavior normal.         Judgment: Judgment normal.           MDM and ED Course     ED Medication Orders (From admission, onward)    Start Ordered     Status Ordering Provider    12/25/18 1527 12/25/18 1526  dextrose 10 % infusion  Continuous      Route: Intravenous     Last MAR action: New Bag Sherena Machorro J    12/25/18 1521 12/25/18 1520  dextrose 50 % bolus 25 mL  Once     Route: Intravenous  Ordered Dose: 25 mL     Last MAR action: Given Zaela Graley J    12/25/18 1521 12/25/18 1520    Continuous     Route: Intravenous  Ordered Dose: 100 mL/hr     Discontinued Ganesh Deeg J    12/25/18 1137 12/25/18 1136  dextrose 50 % bolus 25 mL  Once     Route: Intravenous  Ordered Dose: 25 mL     Last MAR action: Given Judd Lien J    12/25/18 1134 12/25/18 1133  methylPREDNISolone sodium succinate (Solu-MEDROL) injection 125 mg  Once     Route: Intravenous  Ordered Dose: 125 mg     Last MAR action: Given Jeannia Tatro J    12/25/18 1134 12/25/18 1133  albuterol (PROVENTIL) (2.5 MG/3ML) 0.083% nebulizer solution 2.5 mg  RT - Once     Route: Nebulization  Ordered Dose: 2.5 mg     Last MAR action: Given Kinisha Soper J             MDM  Number of Diagnoses or Management Options  Acute renal failure, unspecified acute renal failure type:   Altered mental status, unspecified altered mental status type:   Congestive heart failure, unspecified HF chronicity, unspecified heart failure type:   Hypoglycemia:   Hyponatremia:   Diagnosis management comments: I, Judd Lien, DO (emergency medicine physician), have been the primary provider for this patient during this Emergency Dept visit.     I have reviewed nursing notes on family and social history, past medical issues, and recorded vital signs.    I have reviewed all laboratory tests and radiographic studies (final reports only if available) and have explained these results to the patient and/or family bedside.    Oxygen saturation by pulse oximetry is 95%-100%, Normal.  Interventions: None Needed.    EKG interpretation by Dr. Arloa Koh, emergency medicine physician.  EKG is Abnormal and nonspecific, paced rhythm  ST segments show nonspecific abnormalities but there is no acute st elevation.  Rate is  normal, 73  No significant widened intervals.    DDX: including but not limited to diabetic emergency, renal failure, CHF    Sugar corrected PTA and pt remains stable. Sugar rechecked and 70s a few hours after arrival.     Given long acting antidiabetic therapy and presenting sugar, observation indicated.  In addition pt with s/s of CHF with elevated BNP, vascular congestion, and wheezing.  Initially albuterol and solumedrol given for wheezing.  Lasix given after BNP/x-ray.    3:24 PM  Given drop in sugar, Conley Canal, NP  prefers d/w [facility] which is reasonable given findings.  D10 at low rate ordered to avoid fluid overload and given supplement dextrose.    D/w Dr Abe People who will see pt and admit.       Amount and/or Complexity of Data Reviewed  Clinical lab tests: ordered and reviewed  Tests in the radiology section of CPT: ordered and reviewed  Tests in the medicine section of CPT: ordered and reviewed  Decide to obtain previous medical records or to obtain history from someone other than the patient: yes  Obtain history from someone other than the patient: yes  Review and summarize past medical records: yes  Discuss the patient with other providers: yes  Independent visualization of images, tracings, or specimens: yes               Results     Procedure Component Value Units Date/Time    CULTURE BLOOD AEROBIC AND ANAEROBIC [[id number]] Collected: 12/25/18 1137    Specimen: Blood, Venipuncture Updated: 12/25/18 1528    Narrative:      The order will result in two separate 8-38ml bottles  Please do NOT order repeat blood cultures if one has been  drawn within the last 48 hours  UNLESS concerned for  endocarditis  AVOID BLOOD CULTURE DRAWS FROM CENTRAL LINE IF POSSIBLE  Indications:->Pneumonia  1 BLUE+1 PURPLE    CULTURE BLOOD AEROBIC AND ANAEROBIC [[id number]] Collected: 12/25/18 1137    Specimen: Blood, Venipuncture Updated: 12/25/18 1528    Narrative:      The order will result in two separate 8-47ml bottles  Please do NOT  order repeat blood cultures if one has been  drawn within the last 48 hours  UNLESS concerned for  endocarditis  AVOID BLOOD CULTURE DRAWS FROM CENTRAL LINE IF POSSIBLE  Indications:->Bacteremia  Indications:->Pneumonia  1 BLUE+1 PURPLE    Glucose Whole Blood - POCT [[id number]]  (Abnormal) Collected: 12/25/18 1516     Updated: 12/25/18 1521     Whole Blood Glucose POCT 39 mg/dL     Glucose Whole Blood - POCT [[id number]] Collected: 12/25/18 1303     Updated: 12/25/18 1306     Whole Blood Glucose POCT 73 mg/dL     B-type Natriuretic Peptide [[id number]]  (Abnormal) Collected: 12/25/18 1137    Specimen: Blood Updated: 12/25/18 1242     B-Natriuretic Peptide 2,143.0 pg/mL     GFR [[id number]] Collected: 12/25/18 1137     Updated: 12/25/18 1223     EGFR 38.0    Comprehensive metabolic panel [[id number]]  (Abnormal) Collected: 12/25/18 1137    Specimen: Blood Updated: 12/25/18 1223     Glucose 45 mg/dL      BUN 21.0 mg/dL      Creatinine 1.7 mg/dL      Sodium 128 mEq/L      Potassium 4.0 mEq/L      Chloride 98 mEq/L      CO2 21 mEq/L      Calcium 8.5 mg/dL      Protein, Total 5.7 g/dL      Albumin 3.4 g/dL      AST (SGOT) 21 U/L      ALT 6 U/L      Alkaline Phosphatase 46 U/L      Bilirubin, Total 0.3 mg/dL      Globulin 2.3 g/dL      Albumin/Globulin Ratio 1.5     Anion Gap 9.0    CBC and differential [[id number]]  (Abnormal) Collected: 12/25/18 1137  Specimen: Blood Updated: 12/25/18 1215     WBC 9.57 x10 3/uL      Hgb 10.0 g/dL      Hematocrit 29.6 %      Platelets 207 x10 3/uL      RBC 3.23 x10 6/uL      MCV 91.6 fL      MCH 31.0 pg      MCHC 33.8 g/dL      RDW 15 %      MPV 9.2 fL      Nucleated RBC 0.0 /100 WBC      Absolute NRBC 0.00 x10 3/uL     Cell MorpHology [[id number]] Collected: 12/25/18 1137     Updated: 12/25/18 1215     Cell Morphology Normal     Platelet Estimate Normal    Manual Differential [[id number]]  (Abnormal) Collected: 12/25/18 1137     Updated: 12/25/18 1215     Segmented Neutrophils 66 %      Band  Neutrophils 1 %      Lymphocytes Manual 8 %      Monocytes Manual 1 %      Eosinophils Manual 24 %      Basophils Manual 0 %      Neutrophils Absolute Manual 6.32 x10 3/uL      Band Neutrophils Absolute 0.10 x10 3/uL      Lymphocytes Absolute Manual 0.77 x10 3/uL      Monocytes Absolute 0.10 x10 3/uL      Eosinophils Absolute Manual 2.30 x10 3/uL      Basophils Absolute Manual 0.00 x10 3/uL     Troponin I [[id number]] Collected: 12/25/18 1137    Specimen: Blood Updated: 12/25/18 1211     Troponin I 0.05 ng/mL     Glucose Whole Blood - POCT [[id number]]  (Abnormal) Collected: 12/25/18 1134     Updated: 12/25/18 1136     Whole Blood Glucose POCT 43 mg/dL         Radiology Results (24 Hour)     Procedure Component Value Units Date/Time    Chest AP Only [[id number]] Collected: 12/25/18 1214    Order Status: Completed Updated: 12/25/18 1217    Narrative:      HISTORY: Cough    Portable AP view of the chest shows pacemaker generator is seen in left  chest wall. Mild pulmonary vascular congestion is present..   Cardiomediastinal silhouette is mildly enlarged.  No focal bony lesion  is seen.      Impression:          Mild cardiomegaly and pulmonary vascular congestion    J. Theresia Majors, MD   12/25/2018 12:15 PM              Critical Care  Performed by: Almyra Brace, DO  Authorized by: Almyra Brace, DO     Critical care provider statement:     Critical care time (minutes):  40    Critical care was necessary to treat or prevent imminent or life-threatening deterioration of the following conditions:  Endocrine crisis    Critical care was time spent personally by me on the following activities:  Ordering and performing treatments and interventions, ordering and review of laboratory studies, development of treatment plan with patient or surrogate, discussions with consultants, discussions with primary provider, evaluation of patient's response to treatment, examination of patient, ordering and review of radiographic  studies, pulse oximetry, re-evaluation of patient's condition and obtaining history from patient or surrogate  I assumed direction of critical care for this patient from another provider in my specialty: no          Clinical Impression & Disposition     Clinical Impression  Final diagnoses:   Hypoglycemia   Altered mental status, unspecified altered mental status type   Acute renal failure, unspecified acute renal failure type   Congestive heart failure, unspecified HF chronicity, unspecified heart failure type   Hyponatremia        ED Disposition     ED Disposition Condition Date/Time Comment    Admit to [facility] Dec 25, 2018 12:52 PM            New Prescriptions    No medications on file                 Almyra Brace, DO  12/25/18 [place], [place], DO  12/25/18 [place], [place], DO  12/25/18 1612

## 2018-12-26 DIAGNOSIS — E119 Type 2 diabetes mellitus without complications: Secondary | ICD-10-CM

## 2018-12-26 DIAGNOSIS — R7989 Other specified abnormal findings of blood chemistry: Secondary | ICD-10-CM | POA: Diagnosis present

## 2018-12-26 LAB — CBC AND DIFFERENTIAL
Absolute NRBC: 0 10*3/uL (ref 0.00–0.00)
Absolute NRBC: 0 10*3/uL (ref 0.00–0.00)
Basophils Absolute Automated: 0 10*3/uL (ref 0.00–0.08)
Basophils Absolute Automated: 0.01 10*3/uL (ref 0.00–0.08)
Basophils Automated: 0 %
Basophils Automated: 0.2 %
Eosinophils Absolute Automated: 0.05 10*3/uL (ref 0.00–0.44)
Eosinophils Absolute Automated: 0.12 10*3/uL (ref 0.00–0.44)
Eosinophils Automated: 2 %
Eosinophils Automated: 2.1 %
Hematocrit: 28 % — ABNORMAL LOW (ref 37.6–49.6)
Hematocrit: 26.2 % — ABNORMAL LOW (ref 37.6–49.6)
Hgb: 9.3 g/dL — ABNORMAL LOW (ref 12.5–17.1)
Hgb: 8.8 g/dL — ABNORMAL LOW (ref 12.5–17.1)
Immature Granulocytes Absolute: 0.01 10*3/uL (ref 0.00–0.07)
Immature Granulocytes Absolute: 0.01 10*3/uL (ref 0.00–0.07)
Immature Granulocytes: 0.4 %
Immature Granulocytes: 0.2 %
Lymphocytes Absolute Automated: 0.63 10*3/uL (ref 0.42–3.22)
Lymphocytes Absolute Automated: 0.96 10*3/uL (ref 0.42–3.22)
Lymphocytes Automated: 25.7 %
Lymphocytes Automated: 16.9 %
MCH: 30.5 pg (ref 25.1–33.5)
MCH: 30.6 pg (ref 25.1–33.5)
MCHC: 33.2 g/dL (ref 31.5–35.8)
MCHC: 33.6 g/dL (ref 31.5–35.8)
MCV: 91.8 fL (ref 78.0–96.0)
MCV: 91 fL (ref 78.0–96.0)
MPV: 9.5 fL (ref 8.9–12.5)
MPV: 10.1 fL (ref 8.9–12.5)
Monocytes Absolute Automated: 0.06 10*3/uL — ABNORMAL LOW (ref 0.21–0.85)
Monocytes Absolute Automated: 0.42 10*3/uL (ref 0.21–0.85)
Monocytes: 2.4 %
Monocytes: 7.4 %
Neutrophils Absolute: 1.7 10*3/uL (ref 1.10–6.33)
Neutrophils Absolute: 4.15 10*3/uL (ref 1.10–6.33)
Neutrophils: 69.5 %
Neutrophils: 73.2 %
Nucleated RBC: 0 /100 WBC (ref 0.0–0.0)
Nucleated RBC: 0 /100 WBC (ref 0.0–0.0)
Platelets: 198 10*3/uL (ref 142–346)
Platelets: 221 10*3/uL (ref 142–346)
RBC: 3.05 10*6/uL — ABNORMAL LOW (ref 4.20–5.90)
RBC: 2.88 10*6/uL — ABNORMAL LOW (ref 4.20–5.90)
RDW: 15 % (ref 11–15)
RDW: 15 % (ref 11–15)
WBC: 2.45 10*3/uL — ABNORMAL LOW (ref 3.10–9.50)
WBC: 5.67 10*3/uL (ref 3.10–9.50)

## 2018-12-26 LAB — GLUCOSE WHOLE BLOOD - POCT
Whole Blood Glucose POCT: 179 mg/dL — ABNORMAL HIGH (ref 70–100)
Whole Blood Glucose POCT: 190 mg/dL — ABNORMAL HIGH (ref 70–100)
Whole Blood Glucose POCT: 210 mg/dL — ABNORMAL HIGH (ref 70–100)
Whole Blood Glucose POCT: 270 mg/dL — ABNORMAL HIGH (ref 70–100)
Whole Blood Glucose POCT: 274 mg/dL — ABNORMAL HIGH (ref 70–100)

## 2018-12-26 LAB — ECG 12-LEAD
Atrial Rate: 70 {beats}/min
Q-T Interval: 508 ms
QRS Duration: 150 ms
QTC Calculation (Bezet): 559 ms
R Axis: 268 degrees
T Axis: 100 degrees
Ventricular Rate: 73 {beats}/min

## 2018-12-26 LAB — COMPREHENSIVE METABOLIC PANEL
ALT: <6 U/L (ref 0–55)
AST (SGOT): 21 U/L (ref 5–34)
Albumin/Globulin Ratio: 1.1 (ref 0.9–2.2)
Albumin: 3.3 g/dL — ABNORMAL LOW (ref 3.5–5.0)
Alkaline Phosphatase: 43 U/L (ref 38–106)
Anion Gap: 10 (ref 5.0–15.0)
BUN: 22.3 mg/dL (ref 9.0–28.0)
Bilirubin, Total: 0.3 mg/dL (ref 0.2–1.2)
CO2: 19 mEq/L — ABNORMAL LOW (ref 22–29)
Calcium: 8.5 mg/dL (ref 7.9–10.2)
Chloride: 96 mEq/L — ABNORMAL LOW (ref 100–111)
Creatinine: 1.8 mg/dL — ABNORMAL HIGH (ref 0.7–1.3)
Globulin: 2.9 g/dL (ref 2.0–3.6)
Glucose: 273 mg/dL — ABNORMAL HIGH (ref 70–100)
Potassium: 4.7 mEq/L (ref 3.5–5.1)
Protein, Total: 6.2 g/dL (ref 6.0–8.3)
Sodium: 125 mEq/L — ABNORMAL LOW (ref 136–145)

## 2018-12-26 LAB — MRSA CULTURE
Culture MRSA Surveillance: NEGATIVE
Culture MRSA Surveillance: NEGATIVE

## 2018-12-26 LAB — PHOSPHORUS: Phosphorus: 3.6 mg/dL (ref 2.3–4.7)

## 2018-12-26 LAB — MAGNESIUM: Magnesium: 1.7 mg/dL (ref 1.6–2.6)

## 2018-12-26 LAB — GFR: EGFR: 35.6

## 2018-12-26 MED ORDER — DEXTROSE-NACL 5-0.9 % IV SOLN
INTRAVENOUS | Status: DC
Start: 2018-12-26 — End: 2018-12-26

## 2018-12-26 MED ORDER — POTASSIUM CHLORIDE IN NACL 20-0.9 MEQ/L-% IV SOLN
INTRAVENOUS | Status: DC
Start: 2018-12-26 — End: 2018-12-26

## 2018-12-26 MED ORDER — MAGNESIUM SULFATE IN D5W 1-5 GM/100ML-% IV SOLN
1.0000 g | INTRAVENOUS | Status: AC
Start: 2018-12-26 — End: 2018-12-26
  Administered 2018-12-26 (×2): 1 g via INTRAVENOUS
  Filled 2018-12-26 (×2): qty 100

## 2018-12-26 MED ORDER — GLUCAGON 1 MG IJ SOLR (WRAP)
1.00 mg | INTRAMUSCULAR | Status: DC | PRN
Start: 2018-12-26 — End: 2018-12-28

## 2018-12-26 MED ORDER — DEXTROSE 50 % IV SOLN
12.50 g | INTRAVENOUS | Status: DC | PRN
Start: 2018-12-26 — End: 2018-12-28

## 2018-12-26 MED ORDER — GLUCOSE 40 % PO GEL
15.00 g | ORAL | Status: DC | PRN
Start: 2018-12-26 — End: 2018-12-28

## 2018-12-26 MED ORDER — SODIUM CHLORIDE 1 G PO TABS
1.0000 g | ORAL_TABLET | Freq: Three times a day (TID) | ORAL | Status: DC
Start: 2018-12-27 — End: 2018-12-28
  Administered 2018-12-27 (×3): 1 g via ORAL
  Filled 2018-12-26 (×9): qty 1

## 2018-12-26 MED ORDER — INSULIN LISPRO 100 UNIT/ML SC SOLN
1.00 [IU] | Freq: Three times a day (TID) | SUBCUTANEOUS | Status: DC
Start: 2018-12-26 — End: 2018-12-28
  Administered 2018-12-26: 17:00:00 3 [IU] via SUBCUTANEOUS
  Administered 2018-12-27: 17:00:00 1 [IU] via SUBCUTANEOUS
  Filled 2018-12-26: qty 9
  Filled 2018-12-26: qty 3

## 2018-12-26 MED ORDER — HEPARIN SODIUM (PORCINE) 5000 UNIT/ML IJ SOLN
5000.00 [IU] | Freq: Two times a day (BID) | INTRAMUSCULAR | Status: DC
Start: 2018-12-26 — End: 2018-12-28
  Administered 2018-12-26 – 2018-12-27 (×4): 5000 [IU] via SUBCUTANEOUS
  Filled 2018-12-26 (×4): qty 1

## 2018-12-26 MED ORDER — INSULIN LISPRO 100 UNIT/ML SC SOLN
1.00 [IU] | Freq: Every evening | SUBCUTANEOUS | Status: DC
Start: 2018-12-26 — End: 2018-12-28

## 2018-12-26 NOTE — Progress Notes (Signed)
Situation Patient was admitted to the [facility] with the dx of hypoglycemia, respiratory failure and AMS    R/O COVID-19, CONTACT AND DROPLET ISOLATION     Background Hx of CHF, CKD, HTN, DM (type II), Pulm edema, pacemaker, Knee Arthritis, chronic systemic steroid and pain medication use.    Has Manual Wheel chair     Assessment As per daughter patient lives with her in a 3 level house, his bedroom is upstairs, he stays there, needs assistance with his ADLs and has MDCD waiver services with the [facility] agency 7 days a week from 7:00 am to 11 am.  Patient is never alone at home his 83 yrs old grandson lives at home as well to help the patient.  Patient does not walk at home, but family picks him up physically and bring him down to got the doctor's office.  Has PCP and insurance.      Patient's daughter said that patient has [employer] as well.  CM requested her to bring the insurance card to the Registration so that they can update the information in the system.      Recommendation Plan is to go home and resume waiver services.  CM will follow up as needed.           12/26/18 1508   Patient Type   Within 30 Days of Previous Admission? No   Healthcare Decisions   Interviewed: Family   Name of interviewee if other than the pt: Tab Rylee, daughter, [phone] Home   Interviewee Contact Information: Deakon Frix, daughter, [phone] Home   Orientation/Decision Making Abilities of Patient Confused, unable to make decisions   Advance Directive Patient has advance directive, copy not in chart   Advance Directive not in Chart Copy requested from family/decision [place] Appointed Yes   (RETIRED) Healthcare Agent's Name Akbar Sacra, grand son, [phone] home   (RETIRED) Healthcare Agent's Phone Number Trennon Torbeck, grand son, [phone] home   Additional Emergency Contacts? Cuinn Westerhold, daughter, [phone] Home   Prior to admission   Prior level of function Needs assistance with ADLs;Bedbound  / Total Care;Up to chair with assistance   Type of Residence Private residence   Home Layout Multi-level;Bed/bath upstairs;Stairs to enter with rails (add number in comment)   Have running water, electricity, heat, etc? Yes   Living Arrangements Children;Family members   How do you get to your MD appointments? family or care giver   How do you get your groceries? family   Who fixes your meals? family or caregiver   Who does your laundry? family or caregiver   Who picks up your prescriptions? family   Dressing Needs assistance   Grooming Needs assistance   Feeding Needs assistance   Bathing Needs assistance   Toileting Needs assistance   DME Currently at Home ADL- 3-in-1 Bedside Commode;Wheelchair, Manual   Name of Prior [place] Waiver Services   Name of [facility]   Frequency of services daily   Prior SNF admission? (Detail) [facility] in the past   Prior Rehab admission? (Detail) none   Adult Protective Services (APS) involved? No   Discharge Planning   Support Systems Children;Family members;Home care staff   Patient expects to be discharged to: home with resumption of care   Anticipated [place] plan discussed with: Same as interviewed;Family   [place] discussion contact information: Aquilla Voiles, daughter, [phone] Home   Potential barriers  to discharge: Testing/procedure   Mode of transportation: Medicaid transport   Consults/Providers   PT Evaluation Needed 1   OT Evalulation Needed 1   SLP Evaluation Needed 2   Outcome Palliative Care Screen Screened, met criteria for intervention   Current State of Palliative Care Physician to provide primary palliative care  (MD to discuss with the family yet)   Correct PCP listed in Epic? Yes   Family and PCP   In case you are admitted, would like family notified? Yes   Name of family member to be notified Viral Schramm, daughter, [phone] Home   In case you are admitted, would like your PCP notified? Yes   PCP on file  was verified as the current PCP? Yes   Important Message from Medicare Notice   Patient received 1st IMM Letter? n/a     Jacklynn Barnacle, RN MSN ACM  Clinical Case Manager II  [phone]

## 2018-12-26 NOTE — UM Notes (Signed)
[facility] Utilization Review   NPI [id number], Tax ID [id number]  Fax final authorization and requests for additional information to [phone]    IP review 12/25/18-12/26/18    Patient admitted to IP for hypoglycemia and hyponatremia     12/25/18  History of present illness: Pt is a 85 y.o. male who arrived to the ER (12/25/2018 at 1055) with C/OHypoglycemia    DIAGNOSIS:     ICD-10-CM    1. Hypoglycemia  E16.2    2. Altered mental status, unspecified altered mental status type  R41.82    3. Acute renal failure, unspecified acute renal failure type  N17.9    4. Congestive heart failure, unspecified HF chronicity, unspecified heart failure type  I50.9    5. Hyponatremia  E87.1        PATIENT NAME: Marco Bruce,Marco Bruce / May 10, 1926 / AGE: 85 y.o.  MRN#: [id number]  CSN#: [id number]    PAYOR:Payor: MEDICAID HMO / Plan: [place] PLUS / Product Type: MANAGED MEDICAID /        PMH:  has a past medical history of Acute diastolic heart failure (59/29/2446), Acute on chronic diastolic congestive heart failure (08/13/2016), Acute pulmonary edema (05/26/2015), Chronic diastolic heart failure (28/63/8177), Chronic gout, Chronic pain syndrome (07/07/2015), CKD (chronic kidney disease) stage 4, GFR 15-29 ml/min, Congestive heart failure (CHF), Current chronic use of systemic steroids, Essential hypertension, Mixed hyperlipidemia, NSTEMI (non-ST elevated myocardial infarction) (08/13/2016), Osteoarthritis of both knees, Peripheral edema (06/16/2015), Pneumonia due to infectious organism, unspecified laterality, unspecified part of lung (06/25/2015), Polymyalgia rheumatica, 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  PSH:  has a past surgical history that includes PACEMAKER (Left, 2017); Abdominal surgery; and Cholecystectomy.    ED TREATMENT  on 12/25/2018  10:55 AM    V/S:  Hr 80, rr 21, 138/62    Labs - wbc 9.57, hbg 10, hct 29.6, rbc 3.23, glucose 45, cr 1.7, na 128, chl 98, co2 21, protein 5.7, albumin 3.4, BNP 2143    Radiologic Studies -   Chest Ap Only    Result Date: 12/25/2018  Mild cardiomegaly and pulmonary vascular congestion J. Theresia Majors, MD  12/25/2018 12:15 PM       EKG:  Ventricular-paced rhythm   ABNORMAL ECG       ED meds:   Solu-medrol 125mg  IV  Albuterol 2.5mg  neb  D50% 40ml IV      Critical Care medicine H/P:  CV:  -Systolic (11-65%) and diastolic congestive heart failure: Last echo on 10/19.BNP is elevated but does not have any evidence of CHF exacerbation.  -Coronary artery disease  -History of heart block, status post pacemaker placement in 2017.  Maintain MAP between 60-65 mmHg.    Infectious Disease (ID):  -We will rule out COVID    Renal /Fluid, Electrolytes :  -Chronic kidney disease:  -History of gout  -Mild hyponatremia:  Would continue close monitoring of I/O.   Replace electrolytes as needed.     GI/Nutrition:  -Start diet when more awake.  Aspiration precaution.    Hem/Onc:  -Anemia:  We will monitor the hemoglobin.     Endocrine/Rheumatology :  -Persistent hypoglycemia: Patient is diabetic and is on Lantus 25 units daily.Start patient on D10 and monitor blood sugar every 2 hours  -History of polymyalgia rheumatica:          12/26/18    Patient remains in [facility]  VS: t 98.1, hr 78, rr 18, 135/94    Labs: wbc 2.45, hbg 9.3, hct 28, rbc 3.05, glucose 273, cr 1.8, na 125, chl 96, co2 19, albumin 3.3    Critical Care medicine significant event note:  Mag repleted  Na decreased from 128 to 125 so IVF changed to [place] progress note:  CV:  -Systolic (87-56%) and diastolic congestive heart failure: Last echo on 10/19.BNP is elevated but does not have any evidence of CHF exacerbation.  We will give as needed Lasix  -Coronary artery disease  -History of heart block, status post pacemaker placement in  2017.  Maintain MAP between 60-65 mmHg.    Infectious Disease (ID):  - rule out COVID, clinically less likely    Renal /Fluid, Electrolytes :  -Chronic kidney disease:  -History of gout  -Mild hyponatremia:  Would continue close monitoring of I/O.   Replace electrolytes as needed.     GI/Nutrition:  -[employer] .  Aspiration precaution.    Hem/Onc:  -Anemia:  We will monitor the hemoglobin.     Endocrine/Rheumatology :  -Resolved  hypoglycemia: Patient is diabetic and is on Lantus 25 units daily.Started patient on D10 which was later switched to D5/normal saline.  I will stop the IV fluids.  -History of polymyalgia rheumatica:      Orders:  Heparin 5000 units SQ  Mag sulfate 1G IV  D50 31ml IV  Carbohydrate consistent diet  Contact & droplet isolation  scd's  D5NS @100ml /hr

## 2018-12-26 NOTE — Plan of Care (Signed)
Problem: Safety  Goal: Patient will be free from injury during hospitalization  Outcome: Progressing  Flowsheets (Taken 12/26/2018 1626)  Patient will be free from injury during hospitalization:   Assess patient's risk for falls and implement fall prevention plan of care per policy   Provide and maintain safe environment   Include patient/ family/ care giver in decisions related to safety   Use appropriate transfer methods   Ensure appropriate safety devices are available at the bedside   Assess for patients risk for elopement and implement [place] per policy   Provide alternative method of communication if needed (communication boards, writing)   Hourly rounding  Note: Bed in low position, bed alarm on, call bell within reached     Problem: Moderate/High Fall Risk Score >5  Goal: Patient will remain free of falls  Flowsheets (Taken 12/26/2018 1626)  Moderate Risk (6-13):   MOD-Apply bed exit alarm if patient is confused   MOD-Place bedside commode and assistive devices out of sight when not in use   MOD-include family in multidisciplinary POC discussions     Problem: Compromised Tissue integrity  Goal: Nutritional status is improving  Flowsheets (Taken 12/26/2018 1626)  Nutritional status is improving:   Assist patient with eating   Allow adequate time for meals   Encourage patient to take dietary supplement(s) as ordered   Include patient/patient care companion in decisions related to nutrition  Note: Encouraged family to bring food from home

## 2018-12-26 NOTE — Significant Event (Signed)
Mag repleted  Na decreased from 128 to 125 so IVF changed to D5NS

## 2018-12-26 NOTE — Progress Notes (Signed)
Marco Bruce) Progress Note          Date / Time: 09/22/207:09 AM Room:   W308/W308-A  Patient:   Marco Bruce   Admitted: 12/25/2018  Attending:   Waynetta Sandy, MD   Status: NO CPR - SUPPORT [place] [facility] Day /  LOS: 1 day           Assessment    Marco Bruce is a 85 y.o. male with history of diabetes mellitus admitted on 12/25/2022 altered mental status and found to have persistent hypoglycemia.    Patient Active Problem List    Diagnosis Date Noted    Hypoglycemia 12/25/2018    PMR (polymyalgia rheumatica) 06/11/2018    Cellulitis 04/27/2018    Gout flare 04/26/2018    Chronic pain 04/09/2018    Acute gout of knees 04/09/2018    Pacemaker 12/01/2017    IDDM (insulin dependent diabetes mellitus)     Essential hypertension 08/13/2016    CKD (chronic kidney disease), stage IV      Last 24 Hrs   Hypoglycemia has resolved    Bruce Course   -12/25/2018: Admitted for altered mental status because of hypoglycemia.    Plan   Critical Care support by organ system:  PULM:  -Stable    CV:  -Systolic (92-42%) and diastolic congestive heart failure: Last echo on 10/19.  BNP is elevated but does not have any evidence of CHF exacerbation.  We will give as needed Lasix  -Coronary artery disease  -History of heart block, status post pacemaker placement in 2017.  Maintain MAP between 60-65 mmHg.    Infectious Disease (ID):  - rule out COVID, clinically less likely    Renal /Fluid, Electrolytes :  -Chronic kidney disease:  -History of gout  -Mild hyponatremia:  Would continue close monitoring of I/O.   Replace electrolytes as needed.     GI/Nutrition:  -[employer] .   Aspiration precaution.    Hem/Onc:  -Anemia:  We will monitor the hemoglobin.     Endocrine/Rheumatology :  -Resolved  hypoglycemia: Patient is diabetic and is on Lantus 25 units daily.Started patient on D10 which was later switched to D5/normal saline.  I will stop the IV fluids.   -History of polymyalgia rheumatica:    Neuro:  -Resolved altered mental status secondary to hypoglycemia    Prophylaxis:    GI Prophylaxis:  VTE Prophylaxis:+ hep sc    IV Access:PIV  Foley Catheter:no    Code Status: No CPR   Patient is currently is  able to make medical decisions.        I have personally reviewed the patients history and 24 hour interval events, along with vitals, labs, radiology images and ventilator settings and additional findings found in detail within [place] team notes from house staff, NPPs and nursing, with their care plans developed with and reviewed by me.     Disposition: Transfer to medical floor     Consultants: None     Physical Exam        Vital signs for last 24 hours:  Temp:  [97.1 F (36.2 C)-97.4 F (36.3 C)] 97.1 F (36.2 C)  Heart Rate:  [62-80] 75  Resp Rate:  [13-21] 13  BP: (93-140)/(42-94) 132/64    I/O last 24 hours:  I/O last 3 completed shifts:  In: 604 [P.O.:50; I.V.:554]  Out: 150 [Urine:150]    I/O this shift:  No intake/output data recorded.    BP 132/64    Pulse 75    Temp 97.1 F (36.2 C) (Temporal)    Resp 13    Ht 1.575 m (5\' 2" )    Wt 63.7 kg (140 lb 6.9 oz)    SpO2 98%    BMI 25.69 kg/m      General: awake,  HEENT: dry mucosa,  no thrush.  Cardiovascular: regular rate and rhythm, no murmurs, rubs or gallops.   Lungs: clear to auscultation bilaterally, without wheezing in ant exam  Abdomen: soft, non-tender, non-distended;   Extremities: no clubbing, cyanosis, ,no  edema.  Neuro: moving extremities.      Data / Meds / Labs / Rads     Current Facility-Administered Medications   Medication Dose Route Frequency    allopurinol  100 mg Oral Daily    aspirin  81 mg Oral Daily    gabapentin  100 mg Oral Q8H [place]    metoprolol succinate XL  25 mg Oral Daily       dextrose 5 % and 0.9% NaCl 100 mL/hr at 12/26/18 0440            Intake/Output Summary (Last 24 hours) at 12/26/2018 0709  Last data filed at 12/26/2018 0525  Gross per 24 hour   Intake 604 ml    Output 150 ml   Net 454 ml     Results     Procedure Component Value Units Date/Time    GFR [[id number]] Collected: 12/26/18 0300     Updated: 12/26/18 0335     EGFR 35.6    Comprehensive metabolic panel [[id number]]  (Abnormal) Collected: 12/26/18 0300    Specimen: Blood Updated: 12/26/18 0335     Glucose 273 mg/dL      BUN 22.3 mg/dL      Creatinine 1.8 mg/dL      Sodium 125 mEq/L      Potassium 4.7 mEq/L      Chloride 96 mEq/L      CO2 19 mEq/L      Calcium 8.5 mg/dL      Protein, Total 6.2 g/dL      Albumin 3.3 g/dL      AST (SGOT) 21 U/L      ALT <6 U/L      Alkaline Phosphatase 43 U/L      Bilirubin, Total 0.3 mg/dL      Globulin 2.9 g/dL      Albumin/Globulin Ratio 1.1     Anion Gap 10.0    Magnesium [[id number]] Collected: 12/26/18 0300    Specimen: Blood Updated: 12/26/18 0335     Magnesium 1.7 mg/dL     Phosphorus [[id number]] Collected: 12/26/18 0300    Specimen: Blood Updated: 12/26/18 0335     Phosphorus 3.6 mg/dL     CBC and differential [[id number]]  (Abnormal) Collected: 12/26/18 0300     Updated: 12/26/18 0321     WBC 2.45 x10 3/uL      Hgb 9.3 g/dL      Hematocrit 28.0 %      Platelets 198 x10 3/uL      RBC 3.05 x10 6/uL      MCV 91.8 fL      MCH 30.5 pg      MCHC 33.2 g/dL      RDW 15 %      MPV 9.5 fL      Neutrophils 69.5 %      Lymphocytes Automated 25.7 %  Monocytes 2.4 %      Eosinophils Automated 2.0 %      Basophils Automated 0.0 %      Immature Granulocytes 0.4 %      Nucleated RBC 0.0 /100 WBC      Neutrophils Absolute 1.70 x10 3/uL      Lymphocytes Absolute Automated 0.63 x10 3/uL      Monocytes Absolute Automated 0.06 x10 3/uL      Eosinophils Absolute Automated 0.05 x10 3/uL      Basophils Absolute Automated 0.00 x10 3/uL      Immature Granulocytes Absolute 0.01 x10 3/uL      Absolute NRBC 0.00 x10 3/uL     Glucose Whole Blood - POCT [[id number]]  (Abnormal) Collected: 12/26/18 0027     Updated: 12/26/18 0055     Whole Blood Glucose POCT 190 mg/dL     MRSA culture - Nares [[id number]]  Collected: 12/25/18 2009    Specimen: Culturette from Nares Updated: 12/26/18 0019    MRSA culture - Throat [[id number]] Collected: 12/25/18 2009    Specimen: Culturette from Throat Updated: 12/26/18 0019    COVID-19 (SARS-COV-2) ([place] Standard test) [[id number]] Collected: 12/25/18 2009    Specimen: Nasopharyngeal Swab from Nasopharynx Updated: 12/26/18 0010     Purpose of COVID testing Diagnostic -PUI     SARS-CoV-2 Specimen Source Nasopharyngeal    Narrative:      o Collect and clearly label specimen type:  o PREFERRED-Upper respiratory specimen: One Nasopharyngeal  Swab in Transport Media.  o Hand deliver to laboratory ASAP    Glucose Whole Blood - POCT [[id number]]  (Abnormal) Collected: 12/25/18 2229     Updated: 12/25/18 2234     Whole Blood Glucose POCT 157 mg/dL     Glucose Whole Blood - POCT [[id number]]  (Abnormal) Collected: 12/25/18 2035     Updated: 12/25/18 2055     Whole Blood Glucose POCT 146 mg/dL     Glucose Whole Blood - POCT [[id number]]  (Abnormal) Collected: 12/25/18 1858     Updated: 12/25/18 1909     Whole Blood Glucose POCT 126 mg/dL     Glucose Whole Blood - POCT [[id number]]  (Abnormal) Collected: 12/25/18 1750     Updated: 12/25/18 1753     Whole Blood Glucose POCT 112 mg/dL     Glucose Whole Blood - POCT [[id number]]  (Abnormal) Collected: 12/25/18 1628     Updated: 12/25/18 1631     Whole Blood Glucose POCT 131 mg/dL     CULTURE BLOOD AEROBIC AND ANAEROBIC [[id number]] Collected: 12/25/18 1137    Specimen: Blood, Venipuncture Updated: 12/25/18 1528    Narrative:      The order will result in two separate 8-58ml bottles  Please do NOT order repeat blood cultures if one has been  drawn within the last 48 hours  UNLESS concerned for  endocarditis  AVOID BLOOD CULTURE DRAWS FROM CENTRAL LINE IF POSSIBLE  Indications:->Pneumonia  1 BLUE+1 PURPLE    CULTURE BLOOD AEROBIC AND ANAEROBIC [[id number]] Collected: 12/25/18 1137    Specimen: Blood, Venipuncture Updated: 12/25/18 1528    Narrative:      The  order will result in two separate 8-55ml bottles  Please do NOT order repeat blood cultures if one has been  drawn within the last 48 hours  UNLESS concerned for  endocarditis  AVOID BLOOD CULTURE DRAWS FROM CENTRAL LINE IF POSSIBLE  Indications:->Bacteremia  Indications:->Pneumonia  1 BLUE+1 PURPLE    Glucose Whole Blood - POCT [[id number]]  (  Abnormal) Collected: 12/25/18 1516     Updated: 12/25/18 1521     Whole Blood Glucose POCT 39 mg/dL     Glucose Whole Blood - POCT [[id number]] Collected: 12/25/18 1303     Updated: 12/25/18 1306     Whole Blood Glucose POCT 73 mg/dL     B-type Natriuretic Peptide [[id number]]  (Abnormal) Collected: 12/25/18 1137    Specimen: Blood Updated: 12/25/18 1242     B-Natriuretic Peptide 2,143.0 pg/mL     GFR [[id number]] Collected: 12/25/18 1137     Updated: 12/25/18 1223     EGFR 38.0    Comprehensive metabolic panel [[id number]]  (Abnormal) Collected: 12/25/18 1137    Specimen: Blood Updated: 12/25/18 1223     Glucose 45 mg/dL      BUN 21.0 mg/dL      Creatinine 1.7 mg/dL      Sodium 128 mEq/L      Potassium 4.0 mEq/L      Chloride 98 mEq/L      CO2 21 mEq/L      Calcium 8.5 mg/dL      Protein, Total 5.7 g/dL      Albumin 3.4 g/dL      AST (SGOT) 21 U/L      ALT 6 U/L      Alkaline Phosphatase 46 U/L      Bilirubin, Total 0.3 mg/dL      Globulin 2.3 g/dL      Albumin/Globulin Ratio 1.5     Anion Gap 9.0    CBC and differential [[id number]]  (Abnormal) Collected: 12/25/18 1137    Specimen: Blood Updated: 12/25/18 1215     WBC 9.57 x10 3/uL      Hgb 10.0 g/dL      Hematocrit 29.6 %      Platelets 207 x10 3/uL      RBC 3.23 x10 6/uL      MCV 91.6 fL      MCH 31.0 pg      MCHC 33.8 g/dL      RDW 15 %      MPV 9.2 fL      Nucleated RBC 0.0 /100 WBC      Absolute NRBC 0.00 x10 3/uL     Cell MorpHology [[id number]] Collected: 12/25/18 1137     Updated: 12/25/18 1215     Cell Morphology Normal     Platelet Estimate Normal    Manual Differential [[id number]]  (Abnormal) Collected: 12/25/18 1137      Updated: 12/25/18 1215     Segmented Neutrophils 66 %      Band Neutrophils 1 %      Lymphocytes Manual 8 %      Monocytes Manual 1 %      Eosinophils Manual 24 %      Basophils Manual 0 %      Neutrophils Absolute Manual 6.32 x10 3/uL      Band Neutrophils Absolute 0.10 x10 3/uL      Lymphocytes Absolute Manual 0.77 x10 3/uL      Monocytes Absolute 0.10 x10 3/uL      Eosinophils Absolute Manual 2.30 x10 3/uL      Basophils Absolute Manual 0.00 x10 3/uL     Troponin I [[id number]] Collected: 12/25/18 1137    Specimen: Blood Updated: 12/25/18 1211     Troponin I 0.05 ng/mL     Glucose Whole Blood - POCT [[id number]]  (Abnormal) Collected: 12/25/18 1134     Updated: 12/25/18 1136  Whole Blood Glucose POCT 43 mg/dL         Recent Labs     12/26/18  0300 12/25/18  1137   Sodium 125* 128*   Potassium 4.7 4.0   Chloride 96* 98*   CO2 19* 21*   BUN 22.3 21.0   Creatinine 1.8* 1.7*   Glucose 273* 45*   Calcium 8.5 8.5   Magnesium 1.7  --    Phosphorus 3.6  --      Recent Labs   Lab 12/26/18  0300 12/25/18  1137   WBC 2.45* 9.57*   Hgb 9.3* 10.0*   Hematocrit 28.0* 29.6*   Platelets 198 207     Recent Labs   Lab 12/26/18  0300 12/25/18  1137   ALT <6 6   AST (SGOT) 21 21   Bilirubin, Total 0.3 0.3             Recent Labs   Lab 12/25/18  1137   Troponin I 0.05           Chest Ap Only    Result Date: 12/25/2018  Mild cardiomegaly and pulmonary vascular congestion J. Theresia Majors, MD  12/25/2018 12:15 PM       _____________________________________________________________________    This patient has a high probability of sudden clinically significant deterioration which requires the highest level of physician preparedness to intervene urgently. I managed/supervised life or organ supporting interventions that required frequent physician assessments. I devoted my full attention in the [place] to the direct care of this patient for this period of time.  Organ systems require intensive critical care support  (and are described in  more detail in the note assessment and plan above) .     Any critical care time was performed today and is exclusive of teaching, billable procedures, and not overlapping with any other providers.       Signed by: Waynetta Sandy, MD

## 2018-12-26 NOTE — Plan of Care (Addendum)
0400: called for am lab, spoke with Dr Jerilee Hoh of [place]. Na 125, bg 273, mag 1.7, WBC 2,4 , urine output is mininal at 50cc and one small wet pad. Patient bladder scan revealed 243cc. Dr Jerilee Hoh will replace magnesium and switch fluids from D10 to D5NS @100cc /hr.      Problem: Safety  Goal: Patient will be free from infection during hospitalization  Outcome: Progressing     Problem: Psychosocial and Spiritual Needs  Goal: Demonstrates ability to cope with hospitalization/illness  Outcome: Progressing     Problem: Side Effects from Pain Analgesia  Goal: Patient will experience minimal side effects of analgesic therapy  Outcome: Progressing

## 2018-12-26 NOTE — Progress Notes (Signed)
[facility]  Progress Note  Patient Info:   Date/Time: 12/26/2018 / 9:15 PM   Admit Date:12/25/2018  Patient Name:Marco Bruce   JOA:[id number]   PCP: Danella Sensing, MD  Attending Physician:Jahmire Ruffins, Lorin Glass, MD     Assessment and Plan:   Patient Active Hospital Problem List:   Hypoglycemia (12/25/2018)    Assessment: This is a 85 year old gentleman with type 2 insulin-dependent diabetes who presented to the emergency room with change in mental status.  At home patient's blood sugar was found to be very low, 27 and EMS gave glucagon IM nonetheless blood sugar was still low in the emergency room at 47.  There have been no alterations in the diabetic regimen.  His appetite has been stable.  Fluid consumption is been stable.  No recent illnesses.  In the emergency room patient took an D50 x2 and then had to be maintained on a D10 infusion given 1 dose of Solu-Medrol 125 mg.  He was then transferred to the [facility] on 12/25/2018 and transferred to a medical floor bed today, 12/26/2018.    Plan: I have spoken with the [facility] attending who would like the patient monitored overnight on medical floor bed and then discharged home.  Blood sugars have been still up and down.  COVID test is pending   CKD (chronic kidney disease), stage IV ()    Assessment: With anemia of chronic disease and hemoglobin of about 9.  Creatinine on admission was 1.7 and normal is about 1.5.  Sodium levels are still low.  BNP levels are greater than 2000    Plan: Fluid restriction and monitor levels   Essential hypertension (08/13/2016)    Assessment: Patient on low-dose metoprolol of 25 mg daily with good control    Plan: Changes   IDDM (insulin dependent diabetes mellitus) ()    Assessment: Patient has no admissions for hypo glycemia.  Will return him to a regular diet that is carb restricted and follow his blood sugars.    Plan: To scale insulin.   Chronic gout (04/09/2018)    Assessment: Patient is chronically taking allopurinol 100  mg daily.  History of gout flares.  Is related to his chronic kidney disease.    Plan: No change in medications.  Hyponatremia  Assessment: Patient sodium levels are low.  There is no medic occasions that could be doing this.  Sodium levels were normal in January  Plan: IV fluid hydration appears to be excellent and BNP is markedly elevated to 2000.  Salt tablets and fluid restriction    DVT Prohylaxis:heparin   Central Line/Foley Catheter/PICC line status: No  Code Status: NO CPR - SUPPORT OK  Disposition:home  Type of Admission:Inpatient  Expected Date of Discharge: Tomorrow  Milestones required for discharge: Stable blood sugar and return of normal sodium levels    Subjective:   Chief Complaint:  Hypoglycemia      ROS  Objective:     Vitals:    12/26/18 1800 12/26/18 1900 12/26/18 2000 12/26/18 2100   BP: 118/59  120/63 108/71   Pulse: 63 76 76 76   Resp: 13 15 14  (!) 23   Temp:   97.5 F (36.4 C)    TempSrc:   Temporal    SpO2: 97% 96% 96% 98%   Weight:       Height:           Intake/Output Summary (Last 24 hours) at 12/26/2018 2115  Last data filed at 12/26/2018 1400  Gross per 24 hour   Intake 604 ml   Output 950 ml   Net -346 ml    Constitutional:       General: He is not in acute distress.  Patient is eating well.  He speaks via the knees.     Appearance: He is well-developed. He is not diaphoretic.   HENT:      Head: Normocephalic and atraumatic.   Eyes:      Conjunctiva/sclera: Conjunctivae normal.      Pupils: Pupils are equal, round, and reactive to light.   Neck:      Musculoskeletal: Normal range of motion and neck supple.      Vascular: No carotid bruit.   Cardiovascular:      Rate and Rhythm: Normal rate and regular rhythm.      Heart sounds: No murmur. No friction rub.   Pulmonary:      Effort: Pulmonary effort is normal. No respiratory distress.      Breath sounds: Normal breath sounds. No stridor. No wheezing.   Chest:      Chest wall: No tenderness.   Abdominal:      General: There is no  distension.      Palpations: Abdomen is soft.      Tenderness: There is no abdominal tenderness. There is no guarding or rebound.   Musculoskeletal:         General: No tenderness.   Skin:     General: Skin is warm and dry.      Capillary Refill: Capillary refill takes less than 2 seconds.      Findings: No erythema.   Neurological:      Mental Status: He is alert and oriented to person, place, and time.      Cranial Nerves: No cranial nerve deficit.      Coordination: Coordination normal.   Psychiatric:         Behavior: Behavior normal.    Determination through interpreter       Judgment: Judgment normal.       Medications:      Scheduled Meds: PRN Meds:    allopurinol, 100 mg, Oral, Daily  aspirin, 81 mg, Oral, Daily  gabapentin, 100 mg, Oral, Q8H [facility]  heparin (porcine), 5,000 Units, Subcutaneous, Q12H [facility]  insulin lispro, 1-3 Units, Subcutaneous, QHS  insulin lispro, 1-5 Units, Subcutaneous, TID AC  metoprolol succinate XL, 25 mg, Oral, Daily        Continuous Infusions:   dextrose, 15 g of glucose, PRN    And  dextrose, 12.5 g, PRN    And  glucagon (rDNA), 1 mg, PRN            Results of Labs/imaging   Labs and radiology reports have been reviewed.    Hospitalist   Signed by:   Timoteo Gaul  12/26/2018 9:15 PM    *This note was generated by the Epic EMR system/ Dragon speech recognition and may contain inherent errors or omissions not intended by the user. Grammatical errors, random word insertions, deletions, pronoun errors and incomplete sentences are occasional consequences of this technology due to software limitations. Not all errors are caught or corrected. If there are questions or concerns about the content of this note or information contained within the body of this dictation they should be addressed directly with the author for clarification

## 2018-12-26 NOTE — Nursing Progress Note (Signed)
Per Dr Jenny Reichmann of eICU patient is to remain on [facility] for the night for further monitoring. A CBC is ordered for recheck WBC level. No antibiotic order at this time. Fluid restriction of 1200cc/day and Na+ tabs TID.

## 2018-12-27 ENCOUNTER — Inpatient Hospital Stay: Payer: 59

## 2018-12-27 LAB — CBC AND DIFFERENTIAL
Absolute NRBC: 0 10*3/uL (ref 0.00–0.00)
Basophils Absolute Automated: 0.01 10*3/uL (ref 0.00–0.08)
Basophils Automated: 0.2 %
Eosinophils Absolute Automated: 0.17 10*3/uL (ref 0.00–0.44)
Eosinophils Automated: 3 %
Hematocrit: 26.7 % — ABNORMAL LOW (ref 37.6–49.6)
Hgb: 9.1 g/dL — ABNORMAL LOW (ref 12.5–17.1)
Immature Granulocytes Absolute: 0.02 10*3/uL (ref 0.00–0.07)
Immature Granulocytes: 0.4 %
Lymphocytes Absolute Automated: 0.98 10*3/uL (ref 0.42–3.22)
Lymphocytes Automated: 17.2 %
MCH: 31 pg (ref 25.1–33.5)
MCHC: 34.1 g/dL (ref 31.5–35.8)
MCV: 90.8 fL (ref 78.0–96.0)
MPV: 9.1 fL (ref 8.9–12.5)
Monocytes Absolute Automated: 0.35 10*3/uL (ref 0.21–0.85)
Monocytes: 6.1 %
Neutrophils Absolute: 4.18 10*3/uL (ref 1.10–6.33)
Neutrophils: 73.1 %
Nucleated RBC: 0 /100 WBC (ref 0.0–0.0)
Platelets: 207 10*3/uL (ref 142–346)
RBC: 2.94 10*6/uL — ABNORMAL LOW (ref 4.20–5.90)
RDW: 15 % (ref 11–15)
WBC: 5.71 10*3/uL (ref 3.10–9.50)

## 2018-12-27 LAB — COVID-19 (SARS-COV-2): SARS CoV 2 Overall Result: NOT DETECTED

## 2018-12-27 LAB — GLUCOSE WHOLE BLOOD - POCT
Whole Blood Glucose POCT: 121 mg/dL — ABNORMAL HIGH (ref 70–100)
Whole Blood Glucose POCT: 132 mg/dL — ABNORMAL HIGH (ref 70–100)
Whole Blood Glucose POCT: 193 mg/dL — ABNORMAL HIGH (ref 70–100)
Whole Blood Glucose POCT: 169 mg/dL — ABNORMAL HIGH (ref 70–100)
Whole Blood Glucose POCT: 120 mg/dL — ABNORMAL HIGH (ref 70–100)

## 2018-12-27 LAB — BASIC METABOLIC PANEL
Anion Gap: 9 (ref 5.0–15.0)
BUN: 29.3 mg/dL — ABNORMAL HIGH (ref 9.0–28.0)
CO2: 19 mEq/L — ABNORMAL LOW (ref 22–29)
Calcium: 8.6 mg/dL (ref 7.9–10.2)
Chloride: 97 mEq/L — ABNORMAL LOW (ref 100–111)
Creatinine: 1.8 mg/dL — ABNORMAL HIGH (ref 0.7–1.3)
Glucose: 141 mg/dL — ABNORMAL HIGH (ref 70–100)
Potassium: 4.4 mEq/L (ref 3.5–5.1)
Sodium: 125 mEq/L — ABNORMAL LOW (ref 136–145)

## 2018-12-27 LAB — MAGNESIUM: Magnesium: 2.2 mg/dL (ref 1.6–2.6)

## 2018-12-27 LAB — GFR: EGFR: 35.6

## 2018-12-27 MED ORDER — ALBUTEROL SULFATE (2.5 MG/3ML) 0.083% IN NEBU
2.50 mg | INHALATION_SOLUTION | Freq: Four times a day (QID) | RESPIRATORY_TRACT | Status: DC | PRN
Start: 2018-12-27 — End: 2018-12-28

## 2018-12-27 NOTE — Plan of Care (Addendum)
Pt si A&Ox1, speaks [place] only. Assisted pt with CHG bath. Incontinent with urine, pad changed a few times. Fed pt with BF and lunch. Pt's covid test came back neg, isolation [place]'ed. Daughter came at 2pm, assisted pt with dinner. IV over left hand is patent. Pt is confused at times, trying to pull leads and lines, not aggressive, just confused about situation, not trying to climb out of bed, needs a lot of reassurance. Pt is ready for transfer. Report given to 60 amin RN at [phone] at 5:20pm.    Problem: Safety  Goal: Patient will be free from injury during hospitalization  Flowsheets (Taken 12/26/2018 1626 by Man, Ubaldo Glassing, RN)  Patient will be free from injury during hospitalization:   Assess patient's risk for falls and implement fall prevention plan of care per policy   Provide and maintain safe environment   Include patient/ family/ care giver in decisions related to safety   Use appropriate transfer methods   Ensure appropriate safety devices are available at the bedside   Assess for patients risk for elopement and implement Elopement Risk Plan per policy   Provide alternative method of communication if needed (communication boards, writing)   Hourly rounding     Problem: Compromised Tissue integrity  Goal: Nutritional status is improving  Flowsheets (Taken 12/26/2018 1626 by Man, Ubaldo Glassing, RN)  Nutritional status is improving:   Assist patient with eating   Allow adequate time for meals   Encourage patient to take dietary supplement(s) as ordered   Include patient/patient care companion in decisions related to nutrition

## 2018-12-27 NOTE — Progress Notes (Signed)
[facility] HOSPITALIST  Progress Note  Patient Info:   Date/Time: 12/27/2018 / 7:34 PM   Admit Date:12/25/2018  Patient Name:Marco Bruce   JGG:[id number]   PCP: Danella Sensing, MD  Attending Physician:Arzella Rehmann, Lorin Glass, MD     Assessment and Plan:   Patient Active Hospital Problem List:   Hypoglycemia (12/25/2018)    Assessment: This is a 85 year old gentleman with type 2 insulin-dependent diabetes who presented to the emergency room with change in mental status.  At home patient's blood sugar was found to be very low, 27 and EMS gave glucagon IM nonetheless blood sugar was still low in the emergency room at 47.  There have been no alterations in the diabetic regimen.  His appetite has decreased but he still takes Lantus 25 units at bedtime and Tradjenta 5 mg daily.  Fluid consumption is been stable.  No recent illnesses.  In the emergency room patient took an D50 x2 and then had to be maintained on a D10 infusion given 1 dose of Solu-Medrol 125 mg.  He was then transferred to the [facility] on 12/25/2018 and transferred to a medical floor bed 12/26/2018.    Plan: I have spoken with the the daughter today who would like to get the patient home now that blood sugars have been stable.  I told her also that the patient is experiencing hypo-natremia as soon as this is improved we will discharge him.  We talked about decreasing his Lantus to 10 units at bedtime and using only when blood sugar at bedtime is greater than 160.  We will continue the Tradjenta.  She asked that I call this into the pharmacy upon discharge.   CKD (chronic kidney disease), stage IV ()    Assessment: With anemia of chronic disease and hemoglobin of about 9.  Creatinine on admission was 1.7 and normal is about 1.5.  Sodium levels are still low.  BNP levels are greater than 2000    Plan: Fluid restriction and monitor sodium levels   Essential hypertension (08/13/2016)    Assessment: Patient on low-dose metoprolol of 25 mg daily with good  control    Plan:  No changes   IDDM (insulin dependent diabetes mellitus) ()    Assessment: Patient has no admissions for hypo glycemia.  Will return him to a regular diet that is carb restricted and follow his blood sugars.    Plan: Sliding scale insulin.   Chronic gout (04/09/2018)    Assessment: Patient is chronically taking allopurinol 100 mg daily.  History of gout flares.  Is related to his chronic kidney disease.    Plan: No change in medications.  Hyponatremia  Assessment: Patient sodium levels are low.  There is no medic occasions that could be doing this.  Sodium levels were normal in January  Plan: IV fluid hydration appears to be excellent and BNP is markedly elevated to 2000.  Salt tablets and fluid restriction.  Daughter knows that the level is low but still wants to take him home as he is fully coherent and does not speak English and was feeling isolated in the hospital.  She agreed to wait till tomorrow and see with the lab level is prior to discharge.    DVT Prohylaxis:heparin   Central Line/Foley Catheter/PICC line status: No  Code Status: NO CPR - SUPPORT OK  Disposition:home  Type of Admission:Inpatient  Expected Date of Discharge: Tomorrow  Milestones required for discharge: Stable blood sugar and return of normal sodium levels  Subjective:   Chief Complaint:  Hypoglycemia      ROS  Objective:     Vitals:    12/27/18 1600 12/27/18 1700 12/27/18 1800 12/27/18 1846   BP: 112/80 130/61 132/65 128/69   Pulse: 77 60 67 65   Resp: 14 17 16 17    Temp:    97.9 F (36.6 C)   TempSrc:    Oral   SpO2: 96% 97% 98% 99%   Weight:       Height:           Intake/Output Summary (Last 24 hours) at 12/27/2018 1934  Last data filed at 12/27/2018 1700  Gross per 24 hour   Intake 700 ml   Output 400 ml   Net 300 ml    Constitutional:       General: He is not in acute distress.  Patient is eating well.  No choking or troubles as he eats his pured meal.  His daughter is in the room and doing the interpretation as per  the patient request.     Appearance: He is well-developed. He is not diaphoretic.   HENT:      Head: Normocephalic and atraumatic.   Eyes:      Conjunctiva/sclera: Conjunctivae normal.      Pupils: Pupils are equal, round, and reactive to light.   Neck:      Musculoskeletal: Normal range of motion and neck supple.      Vascular: No carotid bruit.   Cardiovascular:      Rate and Rhythm: Normal rate and regular rhythm.      Heart sounds: No murmur. No friction rub.   Pulmonary:      Effort: Pulmonary effort is normal. No respiratory distress.      Breath sounds: Normal breath sounds. No stridor. No wheezing.   Chest:      Chest wall: No tenderness.   Abdominal:      General: There is no distension.      Palpations: Abdomen is soft.      Tenderness: There is no abdominal tenderness. There is no guarding or rebound.   Musculoskeletal:         General: No tenderness.   Skin:     General: Skin is warm and dry.      Capillary Refill: Capillary refill takes less than 2 seconds.      Findings: No erythema.   Neurological:      Mental Status: He is alert and oriented to person, place, and time.      Cranial Nerves: No cranial nerve deficit.      Coordination: Coordination normal.   Psychiatric:         Behavior: Behavior normal.    Determination through interpreter       Judgment: Judgment normal.       Medications:      Scheduled Meds: PRN Meds:    allopurinol, 100 mg, Oral, Daily  aspirin, 81 mg, Oral, Daily  gabapentin, 100 mg, Oral, Q8H SCH  heparin (porcine), 5,000 Units, Subcutaneous, Q12H SCH  insulin lispro, 1-3 Units, Subcutaneous, QHS  insulin lispro, 1-5 Units, Subcutaneous, TID AC  metoprolol succinate XL, 25 mg, Oral, Daily  sodium chloride, 1 g, Oral, TID MEALS        Continuous Infusions:   albuterol, 2.5 mg, Q6H PRN  dextrose, 15 g of glucose, PRN    And  dextrose, 12.5 g, PRN    And  glucagon (rDNA), 1 mg, PRN  Results of Labs/imaging   Labs and radiology reports have been reviewed.    Hospitalist    Signed by:   Timoteo Gaul  12/27/2018 7:34 PM    *This note was generated by the Epic EMR system/ Dragon speech recognition and may contain inherent errors or omissions not intended by the user. Grammatical errors, random word insertions, deletions, pronoun errors and incomplete sentences are occasional consequences of this technology due to software limitations. Not all errors are caught or corrected. If there are questions or concerns about the content of this note or information contained within the body of this dictation they should be addressed directly with the author for clarification

## 2018-12-27 NOTE — UM Notes (Signed)
[facility] Utilization Review   NPI [id number], Tax ID [id number]  Fax final authorization and requests for additional information to [phone]      [facility] IP Auth #[id number] Josem Kaufmann covers MCD portion also)  UR to fax clinicals to [phone]  Per Asencion Partridge H @ [facility] [place] [facility] [phone]      IP review 12/25/18-12/26/18    Patient admitted to IP for hypoglycemia and hyponatremia     12/25/18  History of present illness: Pt is a 85 y.o. male who arrived to the ER (12/25/2018 at 1055) with C/OHypoglycemia    DIAGNOSIS:     ICD-10-CM    1. Hypoglycemia  E16.2    2. Altered mental status, unspecified altered mental status type  R41.82    3. Acute renal failure, unspecified acute renal failure type  N17.9    4. Congestive heart failure, unspecified HF chronicity, unspecified heart failure type  I50.9    5. Hyponatremia  E87.1        PATIENT NAME: Marco Bruce,Marco Bruce / [birth year] / AGE: 85 y.o.  MRN#: [id number]  CSN#: [id number]    PAYOR:Payor: MEDICAID HMO / Plan: [place] PLUS / Product Type: MANAGED MEDICAID /        PMH:  has a past medical history of Acute diastolic heart failure (73/22/0254), Acute on chronic diastolic congestive heart failure (08/13/2016), Acute pulmonary edema (05/26/2015), Chronic diastolic heart failure (27/09/2374), Chronic gout, Chronic pain syndrome (07/07/2015), CKD (chronic kidney disease) stage 4, GFR 15-29 ml/min, Congestive heart failure (CHF), Current chronic use of systemic steroids, Essential hypertension, Mixed hyperlipidemia, NSTEMI (non-ST elevated myocardial infarction) (08/13/2016), Osteoarthritis of both knees, Peripheral edema (06/16/2015), Pneumonia due to infectious organism, unspecified laterality, unspecified part of lung (06/25/2015), Polymyalgia rheumatica, 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  PSH:  has a past surgical history that includes PACEMAKER (Left, 2017); Abdominal surgery; and Cholecystectomy.    ED TREATMENT  on 12/25/2018 10:55 AM    V/S:  Hr 80, rr 21, 138/62    Labs - wbc 9.57, hbg 10, hct 29.6, rbc 3.23, glucose 45, cr 1.7, na 128, chl 98, co2 21, protein 5.7, albumin 3.4, BNP 2143    Radiologic Studies -   Chest Ap Only    Result Date: 12/25/2018  Mild cardiomegaly and pulmonary vascular congestion J. Theresia Majors, MD  12/25/2018 12:15 PM       EKG:  Ventricular-paced rhythm   ABNORMAL ECG       ED meds:   Solu-medrol 125mg  IV  Albuterol 2.5mg  neb  D50% 30ml IV      Critical Care medicine H/P:  CV:  -Systolic (28-31%) and diastolic congestive heart failure: Last echo on 10/19.BNP is elevated but does not have any evidence of CHF exacerbation.  -Coronary artery disease  -History of heart block, status post pacemaker placement in 2017.  Maintain MAP between 60-65 mmHg.    Infectious Disease (ID):  -We will rule out COVID    Renal /Fluid, Electrolytes :  -Chronic kidney disease:  -History of gout  -Mild hyponatremia:  Would continue close monitoring of I/O.   Replace electrolytes as needed.     GI/Nutrition:  -Start diet when more awake.  Aspiration precaution.    Hem/Onc:  -Anemia:  We will monitor the hemoglobin.     Endocrine/Rheumatology :  -Persistent hypoglycemia: Patient is diabetic and is on Lantus 25 units daily.Start patient  on D10 and monitor blood sugar every 2 hours  -History of polymyalgia rheumatica:          12/26/18    Patient remains in [facility]    VS: t 98.1, hr 78, rr 18, 135/94    Labs: wbc 2.45, hbg 9.3, hct 28, rbc 3.05, glucose 273, cr 1.8, na 125, chl 96, co2 19, albumin 3.3    Critical Care medicine significant event note:  Mag repleted  Na decreased from 128 to 125 so IVF changed to [place] progress note:  CV:  -Systolic (70-35%) and diastolic congestive heart failure: Last echo on 10/19.BNP is elevated but does not have any  evidence of CHF exacerbation.  We will give as needed Lasix  -Coronary artery disease  -History of heart block, status post pacemaker placement in 2017.  Maintain MAP between 60-65 mmHg.    Infectious Disease (ID):  - rule out COVID, clinically less likely    Renal /Fluid, Electrolytes :  -Chronic kidney disease:  -History of gout  -Mild hyponatremia:  Would continue close monitoring of I/O.   Replace electrolytes as needed.     GI/Nutrition:  -[employer] .  Aspiration precaution.    Hem/Onc:  -Anemia:  We will monitor the hemoglobin.     Endocrine/Rheumatology :  -Resolved  hypoglycemia: Patient is diabetic and is on Lantus 25 units daily.Started patient on D10 which was later switched to D5/normal saline.     Orders:  Heparin 5000 units SQ  Mag sulfate 1G IV  D50 31ml IV  Carbohydrate consistent diet  Contact & droplet isolation  scd's  D5NS @100ml /hr - changed to NACL + 20 KCL @ 75      Plan - need to monitor BS overnight plus monitor Na+ level - remains at 125 - will repeat in AM.  B/C 29.3 creat 1.8. Need Na+ level improved.     BS: 273 -> 210 -> 179 -> 141.    Continues IVF NACL + 20 meq KCL @ 49ml/hr to improve Na level.      Clinicals faxed to [facility].

## 2018-12-27 NOTE — Plan of Care (Addendum)
0600: at around 0500 patient became slightly confused, and attempting to swing his legs OOB. Repositioned, cleaned and adjusted in bed. VSS and s/s of discomfort.           Problem: Safety  Goal: Patient will be free from injury during hospitalization  Outcome: Progressing     Problem: Psychosocial and Spiritual Needs  Goal: Demonstrates ability to cope with hospitalization/illness  Outcome: Progressing     Problem: Compromised Tissue integrity  Goal: Nutritional status is improving  Outcome: Progressing

## 2018-12-28 DIAGNOSIS — M109 Gout, unspecified: Secondary | ICD-10-CM

## 2018-12-28 DIAGNOSIS — Z794 Long term (current) use of insulin: Secondary | ICD-10-CM

## 2018-12-28 DIAGNOSIS — E871 Hypo-osmolality and hyponatremia: Secondary | ICD-10-CM

## 2018-12-28 DIAGNOSIS — N184 Chronic kidney disease, stage 4 (severe): Secondary | ICD-10-CM

## 2018-12-28 DIAGNOSIS — R4182 Altered mental status, unspecified: Secondary | ICD-10-CM | POA: Diagnosis present

## 2018-12-28 DIAGNOSIS — I129 Hypertensive chronic kidney disease with stage 1 through stage 4 chronic kidney disease, or unspecified chronic kidney disease: Secondary | ICD-10-CM

## 2018-12-28 LAB — BASIC METABOLIC PANEL
Anion Gap: 8 (ref 5.0–15.0)
BUN: 30.8 mg/dL — ABNORMAL HIGH (ref 9.0–28.0)
CO2: 20 mEq/L — ABNORMAL LOW (ref 22–29)
Calcium: 8.4 mg/dL (ref 7.9–10.2)
Chloride: 105 mEq/L (ref 100–111)
Creatinine: 1.6 mg/dL — ABNORMAL HIGH (ref 0.7–1.3)
Glucose: 117 mg/dL — ABNORMAL HIGH (ref 70–100)
Potassium: 4.2 mEq/L (ref 3.5–5.1)
Sodium: 133 mEq/L — ABNORMAL LOW (ref 136–145)

## 2018-12-28 LAB — GFR: EGFR: 40.8

## 2018-12-28 LAB — GLUCOSE WHOLE BLOOD - POCT
Whole Blood Glucose POCT: 108 mg/dL — ABNORMAL HIGH (ref 70–100)
Whole Blood Glucose POCT: 95 mg/dL (ref 70–100)

## 2018-12-28 MED ORDER — LANTUS SOLOSTAR 100 UNIT/ML SC SOPN
10.00 [IU] | PEN_INJECTOR | Freq: Every evening | SUBCUTANEOUS | 0 refills | Status: AC
Start: 2018-12-28 — End: ?

## 2018-12-28 MED ORDER — PREDNISONE 5 MG PO TABS
5.0000 mg | ORAL_TABLET | ORAL | Status: DC | PRN
Start: 2018-12-28 — End: 2019-01-01

## 2018-12-28 MED ORDER — SODIUM CHLORIDE 1 G PO TABS
1.0000 g | ORAL_TABLET | Freq: Every day | ORAL | 0 refills | Status: DC
Start: 2018-12-28 — End: 2019-01-01

## 2018-12-28 MED ORDER — LINAGLIPTIN 5 MG PO TABS
5.00 mg | ORAL_TABLET | Freq: Every day | ORAL | 0 refills | Status: AC
Start: 2018-12-28 — End: ?

## 2018-12-28 MED ORDER — METOPROLOL SUCCINATE ER 25 MG PO TB24
25.00 mg | ORAL_TABLET | Freq: Every day | ORAL | Status: AC
Start: 2018-12-28 — End: ?

## 2018-12-28 MED ORDER — ALBUTEROL SULFATE (2.5 MG/3ML) 0.083% IN NEBU
2.50 mg | INHALATION_SOLUTION | Freq: Four times a day (QID) | RESPIRATORY_TRACT | 0 refills | Status: DC | PRN
Start: 2018-12-28 — End: 2019-01-01

## 2018-12-28 NOTE — Progress Notes (Signed)
Signed face to face e-faxed at this time to [place], [place], [address], [place]      Home Health face-to-face (FTF) Encounter (Order [id number])  Consult  Date: 12/28/2018 Department: [facility] Ordering/Authorizing: Timoteo Gaul, MD   Order Information    Order Date/Time Release Date/Time Start Date/Time End Date/Time   12/28/18 11:20 AM None 12/28/18 11:05 AM 12/28/18 11:05 AM   Order Details    Frequency Duration Priority Order Class   Once 1 occurrence Routine Hospital Performed   Standing Order Information    Remaining Occurrences Interval Last Released     0/1 Once 12/28/2018           Provider Information    Ordering User Ordering Provider Authorizing Provider   Emerson Monte, RN Timoteo Gaul, MD Timoteo Gaul, MD   Attending Provider(s) Admitting Provider PCP   Almyra Brace, DO; Waynetta Sandy, MD; Timoteo Gaul, MD Waynetta Sandy, MD Danella Sensing, MD   Verbal Order Info    Action Created on Order Mode Entered by Responsible Provider Signed by Signed on   Ordering 12/28/18 1120 Telephone with [name], RN Timoteo Gaul, MD Timoteo Gaul, MD 12/28/18 1422   Comments    Home nursing required for skilled assessment including cardiopulmonary assessment and dietary education for disease management, and medication instruction.     SN to do Lab draw Chem 8 Next week with results to go to PCP .Ernest Mallick T [phone])     Diagnosis:     Hyponatremia   Hypoglycemia (12/25/2018)    CKD (chronic kidney disease), stage IV ()   Essential hypertension (08/13/2016)    IDDM (insulin dependent diabetes mellitus) ()   Chronic gout (04/09/2018)                     Order Questions    Question Answer Comment   Date of face-to-face (FTF) encounter: 12/28/2018    Medical conditions that necessitate Home Health care: C. Risk for complication/infection/pain requiring follow up  and monitoring     D. Chronic illness & risk for re-hospitalization due to unstable disease status     F. New diagnosis & treatment requiring follow up monitoring and management    Clinical findings that support the need for Skilled Nursing. SN will: C. Monitor for signs and symptoms of exacerbation of disease and management     D. Review medication reconciliation, manage and educate on use and side effects     G. Educate on new diagnosis, treatment & management to prevent re-hospitalization     H. Assess cardiopulmonary status and monitor for signs &symptoms of exacerbation     I. Educate dietary and or fluid restrictions and weight management    Clinical findings that support the need for Physical Therapy. PT will K. N/A    Clinical findings support the need for OT (needs SN/PT order).OT will F. N/A    Clinical findings that support the need for SLP. ST will F. N/A    Per clinical findings, following services are medically necessary: Skilled Nursing    Evidence this patient is homebound because: F. Deconditioned due to advance disease process requiring assistance to leave home     N. Impaired mobility d/t pain, arthritis, weakness that compromises patient safety    Other (please specify) PCP Herbert Pun, [place]          Process Instructions  Please select Home Care Services medically necessary.     Based on the above findings, I certify that this patient is confined to the home and needs intermittent skilled nursing care, physical therapry and / or speech therapy or continues to need occupational therapy. The patient is under my care, and I have initiated the establishment of the plan of care. This patient will be followed by a physician who will periodically review the plan of care.    Collection Information    Consult Order Info    ID Description Priority Start Date Start Time   [id number] [place] face-to-face (FTF) Encounter Routine 12/28/2018 11:05 AM   Provider Specialty Referred to    ______________________________________ _____________________________________   Acknowledgement Info    For At Acknowledged By Acknowledged On   Placing Order 12/28/18 [place] Emerson Monte, RN 12/28/18 1120   Verbal Order Info    Action Created on Order Mode Entered by Responsible Provider Signed by Signed on   Ordering 12/28/18 1120 Telephone with [name], RN Timoteo Gaul, MD Timoteo Gaul, MD 12/28/18 1422   Patient Information    Patient Name   Marco Bruce Sex   Male DOB   [birth year]   Additional Information    Associated Reports External References   Priority and Order Details [employer]

## 2018-12-28 NOTE — Progress Notes (Addendum)
Pt arrived to unit. Has pacemaker device that collects data at bedside.  Pt is confused and not a good historian.  Placed on 1200cc restriction.  Was coughing a lot (with copious oral secretions), but lung sounds diminished. MD aware and ordered chest x-ray. Suction set up at bedside. Instructed pt to use when needing to do so.

## 2018-12-28 NOTE — Discharge Instructions (Signed)
Albuterol inhalation aerosol  Brand Names: Proair HFA, Proventil, Proventil HFA, Respirol, Ventolin, Ventolin HFA  What is this medicine?  ALBUTEROL (al [place]) is a bronchodilator. It helps open up the airways in your lungs to make it easier to breathe. This medicine is used to treat and to prevent bronchospasm.  How should I use this medicine?  This medicine is for inhalation through the mouth. Follow the directions on your prescription label. Take your medicine at regular intervals. Do not use more often than directed. Make sure that you are using your inhaler correctly. Ask you doctor or health care provider if you have any questions.  Talk to your pediatrician regarding the use of this medicine in children. Special care may be needed.  What side effects may I notice from receiving this medicine?  Side effects that you should report to your doctor or health care professional as soon as possible:   allergic reactions like skin rash, itching or hives, swelling of the face, lips, or tongue   breathing problems   chest pain   feeling faint or lightheaded, falls   high blood pressure   irregular heartbeat   fever   muscle cramps or weakness   pain, tingling, numbness in the hands or feet   vomiting  Side effects that usually do not require medical attention (report to your doctor or health care professional if they continue or are bothersome):   cough   difficulty sleeping   headache   nervousness or trembling   stomach upset   stuffy or runny nose   throat irritation   unusual taste  What may interact with this medicine?   anti-infectives like chloroquine and pentamidine   caffeine   cisapride   diuretics   medicines for colds   medicines for depression or for emotional or psychotic conditions   medicines for weight loss including some herbal products   methadone   some antibiotics like clarithromycin, erythromycin, levofloxacin, and linezolid   some heart medicines   steroid  hormones like dexamethasone, cortisone, hydrocortisone   theophylline   thyroid hormones    What if I miss a dose?  If you miss a dose, use it as soon as you can. If it is almost time for your next dose, use only that dose. Do not use double or extra doses.  Where should I keep my medicine?  Keep out of the reach of children.  Store at room temperature between 15 and 30 degrees C (59 and 86 degrees F). The contents are under pressure and may burst when exposed to heat or flame. Do not freeze. This medicine does not work as well if it is too cold. Throw away any unused medicine after the expiration date. Inhalers need to be thrown away after the labeled number of puffs have been used or by the expiration date; whichever comes first. Ventolin HFA should be thrown away 12 months after removing from foil pouch. Check the instructions that come with your medicine.  What should I tell my health care provider before I take this medicine?  They need to know if you have any of the following conditions:   diabetes   heart disease or irregular heartbeat   high blood pressure   pheochromocytoma   seizures   thyroid disease   an unusual or allergic reaction to albuterol, levalbuterol, sulfites, other medicines, foods, dyes, or preservatives   pregnant or trying to get pregnant   breast-feeding  What should I  watch for while using this medicine?  Tell your doctor or health care professional if your symptoms do not improve. Do not use extra albuterol. If your asthma or bronchitis gets worse while you are using this medicine, call your doctor right away.  If your mouth gets dry try chewing sugarless gum or sucking hard candy. Drink water as directed.  NOTE:This sheet is a summary. It may not cover all possible information. If you have questions about this medicine, talk to your doctor, pharmacist, or health care provider. Copyright 2020 Elsevier        Sodium Chloride for oral solution  What is this medicine?  SODIUM  CHLORIDE (SOE dee um KLOOR ide) is a natural salt that is important for normal body functions. Too much or too little sodium in the body can cause health problems. This medicine is used to replace sodium in the body after excessive sweating in order to prevent and treat heat cramps. It is also used to replace sodium when replacement is needed for other medical reasons.  How should I use this medicine?  Take this medicine by mouth. Follow the directions on the prescription label. The tablet is dissolved in water to make a solution that can be swallowed. If dissolving the tablet, mix with 4 ounces (120 ml) of distilled water and use exactly as directed. Do not take your medicine more often than directed.  Talk to your pediatrician regarding the use of this medicine in children. Special care may be needed.  What side effects may I notice from receiving this medicine?  Side effects that you should report to your doctor or health care professional as soon as possible:   allergic reactions like skin rash, itching or hives, swelling of the face, lips, or tongue   confusion   muscle twitching   seizures   swelling of the ankles, feet, hands  Side effects that usually do not require medical attention (report to your doctor or health care professional if they continue or are bothersome):   increased thirst  What may interact with this medicine?     certain medicines for high blood pressure   lithium  What if I miss a dose?  This medicine is not for regular use. Take only those doses prescribed by your doctor or health care professional. Do not take double or extra doses.  Where should I keep my medicine?  Keep out of the reach of children.  Store at room temperature between 15 and 30 degrees C (59 and 86 degrees F). Keep container tightly closed. Throw away any unused medicine after the expiration date.  What should I tell my health care provider before I take this medicine?  They need to know if you have any of these  conditions:   heart disease   high blood pressure   high levels of sodium in the blood   kidney disease   liver disease   low salt or sodium diet   an unusual or allergic reaction to any medicines, foods, dyes, or preservatives   pregnant or trying to get pregnant   breast-feeding  What should I watch for while using this medicine?  Stop taking this medicine and tell your doctor or healthcare professional if your symptoms of heat cramps continue  for more than 24 hours or if they get worse. You may have a serious medical condition.  Each tablet contains 394 mg of sodium. Inform your doctor or healthcare professional if you are on a low  salt or sodium diet.  NOTE:This sheet is a summary. It may not cover all possible information. If you have questions about this medicine, talk to your doctor, pharmacist, or health care provider. Copyright 2020 Elsevier

## 2018-12-28 NOTE — Progress Notes (Signed)
CM spoke with patient daughter [name] over the phone- [phone]. Informed that pt have discharge order. Home health services arranged for blood draw chem 8 next week and follow up with pcp. Daughter [name] will male pcp appt self. Provided transport choices,daughter transport to home at 12pm. Daughter som is in the agreement to discharge patient to home with home health service to follow up.     CM will continue to follow up.        12/28/18 1205   Discharge Disposition   Patient preference/choice provided? Yes   Physical Discharge Disposition Home, Home Health   Name of [place] [facility]acement   ([facility]   Mode of Transportation Car   Patient/Family/POA notified of transfer plan Yes  (pt daughter [name] notified)   Patient agreeable to discharge plan/expected d/c date? Yes   Family/POA agreeable to discharge plan/expected d/c date? Yes   Bedside nurse notified of transport plan? Yes   CM Interventions   Follow up appointment scheduled? No   Reason no follow up scheduled? Family to schedule   Referral made for home health RN visit? Yes   Medicare Checklist   Is this a Medicare patient? Yes   Patient received 1st IMM Letter? Yes     Dahlia Bailiff, RN, BSN  Case Manager-[facility]  [phone]

## 2018-12-28 NOTE — Progress Notes (Addendum)
Referral, H&P, labs, MD note e-faxed to [facility] at this time.    1617: spoke with Verdis Frederickson at [facility]. She reports they have not received the e- fax to [phone],  Request fax to [phone]. Done at this time.  Welton Flakes, RN, [place], [facility]  [phone]    Last result from the past 48 hours)  [place]   Sodium  133  09/24 0610  Potassium  4.2  09/24 0610  Chloride  105  09/24 0610  CO2  20  09/24 0610  BUN  30.8  09/24 0610  Creatinine  1.6  09/24 0610  Hemoglobin  9.1  09/23 0323  Hematocrit  26.7  09/23 0323  WBC  5.71  09/23 0323  Platelets  207  09/23 0323  Glucose  117  09/24 [place], [place], [place], [place]

## 2018-12-28 NOTE — Progress Notes (Addendum)
Discharge instructions reviewed with patient's grandson at bedside. PIV removed by clinical technician. Pt will be escorted out via wheelchair to [facility] entrance. Pt's nephew to transport pt home. Pt BG at discharge is 95 mg/dL. Pt and pt's nephew have no questions or concerns at this time.     Home health added to discharge instructions. New discharge instructions printed and changes reviewed with pt's grandson.

## 2018-12-28 NOTE — Discharge Summary (Addendum)
[facility] HOSPITALIST   [place] Summary   Patient Info:   Date/Time: 12/28/2018 / 2:14 PM   Admit Date:12/25/2018  Patient Name:Marco Bruce   OVP:[id number]   PCP: Danella Sensing, MD  Attending Physician:No att. providers found      Discharge date and time: 12/28/2018 12:25 PM     DIAGNOSIS :   Patient Active Problem List   Diagnosis    Acute renal failure, unspecified acute renal failure type    CKD (chronic kidney disease), stage IV    Hyponatremia    Essential hypertension    IDDM (insulin dependent diabetes mellitus)    Pacemaker    Chronic gout    Hypoglycemia    Elevated brain natriuretic peptide (BNP) level    Altered mental status, unspecified altered mental status type   Mild dementia  Bedbound status      Consults: [place], MD     Hospital Course:   Medical Necessity for stay:85 y.o. male who presents to the hospital with history of diabetes mellitus, insulin-dependent, type II, managed with Tradjenta 5 mg daily and Lantus 25 units at at bedtime.  He was brought to the hospital for being unresponsive.  Patient had been having altered mental status with intermittent confusion for 24 to 48 hours prior to admission and was totally unresponsive and EMS was called.  He was found to be hypoglycemic by the EMS and glucagon was injected intramuscularly.  His daughter typically checks his blood sugar at bedtime and has been giving him Lantus.  Patient is bedbound.  He typically is incontinent of both stool and urine in the bed.  Daughter does a wonderful job caring for him.    Hospital Course:  In the emergency room blood sugar initially improved from 27 in the field to 70 but then dropped again to 40.  He was given D50 x2 and started on D10 and transferred to the [facility].  His insulin and Tradjenta were held.  Blood sugars were monitored.  Electrolytes were also significant for sodium of 125.  He started to eat minimally and pure foods as he normally does.  Salt was  added to this and he was started on salt tablets 1 g 3 times daily.  Over the next 48 hours his sodium improved to 133 and he was discharged home.  I spoke with the daughter and we will keep him off Lantus unless his evening blood sugars are greater than 160.  If he does need Lantus then we will start at 10 units at bedtime and she is to confer with his PCP.  Patient is eating less calories.  We will continue with the [place].  Patient serum creatinine was 1.7 on admission and remained relatively stable.  He had anemia of chronic disease with a hemoglobin of 9.  His white count was normal and no antibiotics were provided.  He had no fevers in the hospital.  Disposition:home    Condition at Discharge and Prognosis: Poor condition at the time of discharge.  His prognosis is overall poor but he is getting wonderful care from his daughter.  Admission Date:12/25/2018  Discharge Date: 12/28/18  Type of Admission:Inpatient    Code Status: NO CPR - SUPPORT OK    Objective:     Vitals:    12/27/18 2357 12/28/18 0105 12/28/18 0452 12/28/18 0749   BP: 130/70  124/67 133/80   Pulse: 62  68 70   Resp: 16  20 18  Temp: 98.1 F (36.7 C) 98.9 F (37.2 C) 98.2 F (36.8 C) 97.7 F (36.5 C)   TempSrc: Oral  Temporal    SpO2: 98%  98% 100%   Weight:       Height:           Discharge Medications:   Discharge Medications:     Discharge Medication List      Taking    albuterol (2.5 MG/3ML) 0.083% nebulizer solution  Dose: 2.5 mg  Commonly known as: PROVENTIL  For: 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  Take 3 mLs (2.5 mg total) by nebulization every 6 (six) hours as needed for Wheezing     allopurinol 100 MG tablet  Dose: 100 mg  Commonly known as: ZYLOPRIM  Take 1 tablet (100 mg total) by mouth daily     aspirin 81 MG chewable tablet  Dose: 81 mg  Chew 1 tablet (81 mg total) by mouth daily     gabapentin 100 MG capsule  Commonly known as: NEURONTIN  TAKE ONE CAPSULE BY MOUTH EVERY 8 HOURS     insulin glargine 100  UNIT/ML injection pen  Dose: 10 Units  What changed:    how much to take   additional instructions  For: Type 2 Diabetes  Inject 10 Units into the skin nightly Check blood sugar prior to administration.Give only if BS > 160.     linaGLIPtin 5 MG Tabs  Dose: 5 mg  Take 1 tablet (5 mg total) by mouth daily     metoprolol succinate XL 25 MG 24 hr tablet  Dose: 25 mg  Commonly known as: TOPROL-XL  For: Heart Failure, High Blood Pressure Disorder  Take 1 tablet (25 mg total) by mouth daily Hold for SBP less than 100 or HR less than 60 Do not crush or chew.     predniSONE 5 MG tablet  Dose: 5 mg  Commonly known as: DELTASONE  Take 1 tablet (5 mg total) by mouth as needed (gout)     sodium chloride 1 g tablet  Dose: 1 g  For: Electrolyte Disorder  Take 1 tablet (1 g total) by mouth daily        STOP taking these medications    acetaminophen 325 MG tablet  Commonly known as: TYLENOL     lactobacillus/streptococcus Caps            Diet: Supervise For Meals Frequency: All meals  Diet dysphagia PUREED (Level 1) Liquid consistency: Thin; Fluid restriction: 1200 ML FLUID    Results of Labs/imaging:   Labs have been reviewed:   Coagulation Profile:       CBC review:   Recent Labs   Lab 12/27/18  0323 12/26/18  2108 12/26/18  0300 12/25/18  1137   WBC 5.71 5.67 2.45* 9.57*   Hgb 9.1* 8.8* 9.3* 10.0*   Hematocrit 26.7* 26.2* 28.0* 29.6*   Platelets 207 221 198 207   MCV 90.8 91.0 91.8 91.6   RDW 15 15 15 15    Neutrophils 73.1 73.2 69.5  --    Segmented Neutrophils  --   --   --  66   Lymphocytes Automated 17.2 16.9 25.7  --    Eosinophils Automated 3.0 2.1 2.0  --    Immature Granulocytes 0.4 0.2 0.4  --    Neutrophils Absolute 4.18 4.15 1.70  --    Immature Granulocytes Absolute 0.02 0.01 0.01  --      Chem Review:  Recent  Labs   Lab 12/28/18  0610 12/27/18  0323 12/26/18  0300 12/25/18  1137   Sodium 133* 125* 125* 128*   Potassium 4.2 4.4 4.7 4.0   Chloride 105 97* 96* 98*   CO2 20* 19* 19* 21*   BUN 30.8* 29.3* 22.3 21.0    Creatinine 1.6* 1.8* 1.8* 1.7*   Glucose 117* 141* 273* 45*   Calcium 8.4 8.6 8.5 8.5   Magnesium  --  2.2 1.7  --    Phosphorus  --   --  3.6  --    Bilirubin, Total  --   --  0.3 0.3   AST (SGOT)  --   --  21 21   ALT  --   --  <6 6   Alkaline Phosphatase  --   --  43 46     Results     Procedure Component Value Units Date/Time    Glucose Whole Blood - POCT [[id number]] Collected: 12/28/18 1120     Updated: 12/28/18 1201     Whole Blood Glucose POCT 95 mg/dL     Glucose Whole Blood - POCT [[id number]]  (Abnormal) Collected: 12/28/18 0747     Updated: 12/28/18 0754     Whole Blood Glucose POCT 108 mg/dL     GFR [[id number]] Collected: 12/28/18 0610     Updated: 12/28/18 0720     EGFR 15.5    Basic Metabolic Panel [[id number]]  (Abnormal) Collected: 12/28/18 0610    Specimen: Blood Updated: 12/28/18 0720     Glucose 117 mg/dL      BUN 30.8 mg/dL      Creatinine 1.6 mg/dL      Calcium 8.4 mg/dL      Sodium 133 mEq/L      Potassium 4.2 mEq/L      Chloride 105 mEq/L      CO2 20 mEq/L      Anion Gap 8.0    CULTURE BLOOD AEROBIC AND ANAEROBIC [[id number]] Collected: 12/26/18 1437    Specimen: Blood, Venipuncture Updated: 12/28/18 0021    Narrative:      The order will result in two separate 8-61ml bottles  Please do NOT order repeat blood cultures if one has been  drawn within the last 48 hours  UNLESS concerned for  endocarditis  AVOID BLOOD CULTURE DRAWS FROM CENTRAL LINE IF POSSIBLE  Indications:->Other  Other->follow up cultures  ORDER#: [id number]                                    ORDERED BY: POOYAN, PAYAM  SOURCE: Blood, Venipuncture LEFT HAND                COLLECTED:  12/26/18 14:37  ANTIBIOTICS AT COLL.:                                RECEIVED :  12/27/18 00:20  Culture Blood Aerobic and Anaerobic        PRELIM      12/28/18 00:21  12/28/18   No Growth after 1 day/s of incubation.      CULTURE BLOOD AEROBIC AND ANAEROBIC [[id number]] Collected: 12/26/18 1437    Specimen: Blood, Venipuncture Updated: 12/28/18 0021     Narrative:      The order will result in two separate 8-63ml bottles  Please do NOT order repeat blood  cultures if one has been  drawn within the last 48 hours  UNLESS concerned for  endocarditis  AVOID BLOOD CULTURE DRAWS FROM CENTRAL LINE IF POSSIBLE  Indications:->Other  Other->follow up cultures  ORDER#: [id number]                                    ORDERED BY: POOYAN, PAYAM  SOURCE: Blood, Venipuncture RIGHT HAND               COLLECTED:  12/26/18 14:37  ANTIBIOTICS AT COLL.:                                RECEIVED :  12/27/18 00:20  Culture Blood Aerobic and Anaerobic        PRELIM      12/28/18 00:21  12/28/18   No Growth after 1 day/s of incubation.      Glucose Whole Blood - POCT [[id number]]  (Abnormal) Collected: 12/27/18 2059     Updated: 12/27/18 2219     Whole Blood Glucose POCT 120 mg/dL     Glucose Whole Blood - POCT [[id number]]  (Abnormal) Collected: 12/27/18 1845     Updated: 12/27/18 1852     Whole Blood Glucose POCT 169 mg/dL     CULTURE BLOOD AEROBIC AND ANAEROBIC [[id number]] Collected: 12/25/18 1137    Specimen: Blood, Venipuncture Updated: 12/27/18 1748    Narrative:      Gram stain Results called to [id number] by [id number]     .  Readback confirmed, by  [id number] on 12/26/2018 at 10:58  The order will result in two separate 8-69ml bottles  Please do NOT order repeat blood cultures if one has been  drawn within the last 48 hours  UNLESS concerned for  endocarditis  AVOID BLOOD CULTURE DRAWS FROM CENTRAL LINE IF POSSIBLE  Indications:->Pneumonia  ORDER#: [id number]                                    ORDERED BY: BERNIER, DENNIS  SOURCE: Blood, Venipuncture L WRIST                  COLLECTED:  12/25/18 11:37  ANTIBIOTICS AT COLL.:                                RECEIVED :  12/25/18 15:28  Gram stain Results called to [id number] by [id number]     .  Readback confirmed, by [id number] on 12/26/2018 at 10:58  Culture Blood Aerobic and Anaerobic        FINAL       12/27/18 17:48   +  12/26/18   Anaerobic Blood Culture Positive  in less than 24 hrs             Gram Stain Shows: Gram positive cocci in clusters             Resembling Staphylococcus species             Aerobic Blood Culture Positive in less than 24 hrs             Gram Stain Shows: Gram positive cocci in clusters  12/27/18   Growth of Staphylococcus (coagulase negative)  Possible skin contaminant, susceptibility testing not             performed without request unless additional blood cultures,             collected within 48 hours of this culture, become positive.             Contact the laboratory for further information if necessary.        CULTURE BLOOD AEROBIC AND ANAEROBIC [[id number]] Collected: 12/25/18 1137    Specimen: Blood, Venipuncture Updated: 12/27/18 1621    Narrative:      The order will result in two separate 8-71ml bottles  Please do NOT order repeat blood cultures if one has been  drawn within the last 48 hours  UNLESS concerned for  endocarditis  AVOID BLOOD CULTURE DRAWS FROM CENTRAL LINE IF POSSIBLE  Indications:->Bacteremia  Indications:->Pneumonia  ORDER#: [id number]                                    ORDERED BY: BERNIER, DENNIS  SOURCE: Blood, Venipuncture RIGHT WRIST              COLLECTED:  12/25/18 11:37  ANTIBIOTICS AT COLL.:                                RECEIVED :  12/25/18 15:28  Culture Blood Aerobic and Anaerobic        PRELIM      12/27/18 16:21  12/26/18   No Growth after 1 day/s of incubation.  12/27/18   No Growth after 2 day/s of incubation.      Glucose Whole Blood - POCT [[id number]]  (Abnormal) Collected: 12/27/18 1600     Updated: 12/27/18 1606     Whole Blood Glucose POCT 193 mg/dL         Radiology reports have been reviewed:  Radiology Results (24 Hour)     Procedure Component Value Units Date/Time    XR Chest AP Portable [[id number]] Collected: 12/27/18 2143    Order Status: Completed Updated: 12/27/18 2145    Narrative:      CLINICAL HISTORY: Productive cough    COMPARISON: 12/25/2018    TECHNIQUE: Single portable  upright view the chest was performed.    FINDINGS:    The lungs are hypoinflated. Pulmonary vascular congestion. The heart is  enlarged. Atherosclerosis of the thoracic aorta. Left subclavian  pacemaker leads are unchanged in positioning.      Impression:        1. Pulmonary vascular congestion.  2. Cardiomegaly.    Lavone Nian, MD   12/27/2018 9:43 PM        Chest Ap Only    Result Date: 12/25/2018  HISTORY: Cough Portable AP view of the chest shows pacemaker generator is seen in left chest wall. Mild pulmonary vascular congestion is present.. Cardiomediastinal silhouette is mildly enlarged.  No focal bony lesion is seen.     Mild cardiomegaly and pulmonary vascular congestion J. Theresia Majors, MD  12/25/2018 12:15 PM    Xr Chest Ap Portable    Result Date: 12/27/2018  CLINICAL HISTORY: Productive cough COMPARISON: 12/25/2018 TECHNIQUE: Single portable upright view the chest was performed. FINDINGS: The lungs are hypoinflated. Pulmonary vascular congestion. The heart is enlarged. Atherosclerosis of the thoracic aorta. Left subclavian pacemaker leads are unchanged in positioning.     1.  Pulmonary vascular congestion. 2. Cardiomegaly. Lavone Nian, MD  12/27/2018 9:43 PM    Pathology:   Specimens (From admission, onward)    None            Follow up recommendations:   Patient was instructed to follow up with Primary Care Doctor Danella Sensing, MD in 1 week it is difficult for the family to get him to the PCP and so I have asked for case management to set up a nurse visit that we will draw blood and assess him and relay these results to the PCP   Pending Lab Results:   Labs/Images to be followed at your PCP office:   Unresulted Labs     None        Hospitalist:   Signed by: Timoteo Gaul  12/28/2018 2:14 PM  Time spent for discharge: 50 minutes    CC: Danella Sensing, MD      *This note was generated by the Epic EMR system/ Dragon speech recognition and may contain inherent errors or  omissions not intended by the user. Grammatical errors, random word insertions, deletions, pronoun errors and incomplete sentences are occasional consequences of this technology due to software limitations. Not all errors are caught or corrected. If there are questions or concerns about the content of this note or information contained within the body of this dictation they should be addressed directly with the author for clarification

## 2018-12-28 NOTE — Discharge Instr - AVS First Page (Addendum)
Reason for your Hospital Admission:  Altered mental status due to low glucose and sodium  Bed bound      Instructions for after your discharge:  Take lantus at bedtime only if BS>160. Check BS nightly as you already do  Salt food  We are arranging for a home visit next week to check blood sugar and sodium level      Home Health Referral                                                                            Referral from Byrd Hesselbach [phone] (Case Manager) for home health care upon discharge.    Home Health Discharge Information     Your doctor has ordered Skilled Nursing in-home service(s) for you while you recuperate at home, to assist you in the transition from hospital to home.    The agency that you or your representative chose to provide the service:    [facility]     Lab draw next week for  Chem 8 with results to Danella Sensing, MD, PCP  [phone]    The above services were set up by:  Emerson [facility]  ([place])   Phone    [phone]      IF YOU HAVE NOT HEARD [place] 24-48 HOURS AFTER DISCHARGE PLEASE CALL YOUR AGENCY TO ARRANGE A TIME FOR YOUR FIRST VISIT. FOR ANY SCHEDULING CONCERNS OR QUESTIONS RELATED TO HOME HEALTH, SUCH AS TIME OR DATE PLEASE CONTACT YOUR HOME HEALTH AGENCY AT THE NUMBER LISTED ABOVE

## 2018-12-28 NOTE — Progress Notes (Addendum)
Home Health Referral          Referral from Marco Bruce [phone] (Case Manager) for home health care upon discharge.    By [employer], the patient has the right to freely choose a home care provider.  Arrangements have been made with:     A company of the patients choosing. We have supplied the patient with a listing of providers in your area who asked to be included and participate in Medicare.   [facility], formerly [facility], a home care agency that provides adult home care services and participates in Medicare   The preferred provider of your insurance company. Choosing a home care provider other than your insurance company's preferred provider may affect your insurance coverage.    Home Health Discharge Information     Your doctor has ordered Skilled Nursing in-home service(s) for you while you recuperate at home, to assist you in the transition from hospital to home.    The agency that you or your representative chose to provide the service:    Marco Bruce     Lab draw next week for  Chem 8 with results to Marco Sensing, MD, PCP  [phone]    The above services were set up by:  Marco Bruce  (Marco Bruce)   Phone    [phone]      IF YOU HAVE NOT HEARD [facility] 24-48 HOURS AFTER DISCHARGE PLEASE CALL YOUR AGENCY TO ARRANGE A TIME FOR YOUR FIRST VISIT. FOR ANY SCHEDULING CONCERNS OR QUESTIONS RELATED TO HOME HEALTH, SUCH AS TIME OR DATE PLEASE CONTACT YOUR HOME HEALTH AGENCY AT THE NUMBER LISTED ABOVE    Additional comments: Call from CM, Marco Bruce, patient for [place] today and will need SN follow up and lab draw ,CHEM 8 next week with results to PCP. Laona spoke with Daughter, Marco Bruce, she is agreeable to SN visit with an in network provider. [facility] who provides waiver aide is not in network with ins. Working on finding agency.    1157: [facility] is in network with ins. PACC spoke with  daughter to review same.     Home Health face-to-face (FTF) Encounter (Order [id number])  Consult  Date: 12/28/2018 Department: [facility] Medical Surgical Ordering/Authorizing: Marco Gaul, MD   Order Information    Order Date/Time Release Date/Time Start Date/Time End Date/Time   12/28/18 11:20 AM None 12/28/18 11:05 AM 12/28/18 11:05 AM   Order Details    Frequency Duration Priority Order Class   Once 1 occurrence Routine Hospital Performed   Standing Order Information    Remaining Occurrences Interval Last Released     0/1 Once 12/28/2018           Provider Information    Ordering User Ordering Provider Authorizing Provider   Marco Monte, RN Marco Gaul, MD Marco Gaul, MD   Attending Provider(s) Admitting Provider PCP   Marco Brace, DO; Marco Sandy, MD; Marco Gaul, MD Marco Sandy, MD Marco Sensing, MD   Verbal Order Info    Action Created on Order Mode Entered by Responsible Provider Signed by Signed on   Ordering 12/28/18 1120 Telephone with Marco Queen, RN Marco Gaul, MD     Comments    Home nursing required for skilled assessment including cardiopulmonary assessment and dietary education for disease management, and medication instruction.  SN to do Lab draw Chem 8 Next week with results to go to PCP .Marco Bruce T [phone])     Diagnosis:     Hyponatremia   Hypoglycemia (12/25/2018)    CKD (chronic kidney disease), stage IV ()   Essential hypertension (08/13/2016)    IDDM (insulin dependent diabetes mellitus) ()   Chronic gout (04/09/2018)                     Order Questions    Question Answer Comment   Date of face-to-face (FTF) encounter: 12/28/2018    Medical conditions that necessitate Home Health care: C. Risk for complication/infection/pain requiring follow up and monitoring     D. Chronic illness & risk for re-hospitalization due to unstable disease status     F. New  diagnosis & treatment requiring follow up monitoring and management    Clinical findings that support the need for Skilled Nursing. SN will: C. Monitor for signs and symptoms of exacerbation of disease and management     D. Review medication reconciliation, manage and educate on use and side effects     G. Educate on new diagnosis, treatment & management to prevent re-hospitalization     H. Assess cardiopulmonary status and monitor for signs &symptoms of exacerbation     I. Educate dietary and or fluid restrictions and weight management    Clinical findings that support the need for Physical Therapy. PT will K. N/A    Clinical findings support the need for OT (needs SN/PT order).OT will F. N/A    Clinical findings that support the need for SLP. ST will F. N/A    Per clinical findings, following services are medically necessary: Skilled Nursing    Evidence this patient is homebound because: F. Deconditioned due to advance disease process requiring assistance to leave home     N. Impaired mobility d/t pain, arthritis, weakness that compromises patient safety    Other (please specify) PCP Marco Bruce, Marco Bruce          Process Instructions    Please select [facility] medically necessary.     Based on the above findings, I certify that this patient is confined to the home and needs intermittent skilled nursing care, physical therapry and / or speech therapy or continues to need occupational therapy. The patient is under my care, and I have initiated the establishment of the plan of care. This patient will be followed by a physician who will periodically review the plan of care.    Collection Information    Consult Order Info    ID Description Priority Start Date Start Time   [id number] [place] face-to-face (FTF) Encounter Routine 12/28/2018 11:05 AM   Provider Specialty Referred to   ______________________________________ _____________________________________   Acknowledgement Info    For At  Acknowledged By Acknowledged On   Placing Order 12/28/18 [place] Marco Monte, RN 12/28/18 1120   Verbal Order Info    Action Created on Order Mode Entered by Responsible Provider Signed by Signed on   Ordering 12/28/18 1120 Telephone with Marco Queen, RN Marco Gaul, MD     Patient Information    Patient Name   Marco Bruce, Marco Bruce Sex   Male DOB   03-14-1927   Additional Information    Associated Reports External References   Priority and Order Details [place]    PATIENT"S DEMOGRAPHICS:      Name: Marco Bruce  Discharge Address: [place] [address]    Primary Telephone Number: [phone]  Secondary Telephone [phone]  Emergency Contact and Number: Extended Emergency Contact Information  Primary Emergency Contact: [name] [place] of [place] Phone: [phone]  Work Phone: [phone]  Mobile Phone: [phone]  Relation: Daughter  Secondary Emergency Contact: [name] [place] of [place] Phone: [phone]  Relation: Grandchild        Ordering Physician:Mancini, Lorin Glass, MD  Following Physician  Marco Sensing, MD  [phone].  PCP: Marco Sensing, MD, [phone]    Agreeable to Follow: Yes  Date/Time of Call:  12/28/2018 @ 1140, Per [place]   MD will sign orders, fax [phone]    Language/Communication Barrier:  Yes  ,non verbal      Primary Diagnosis and Reason for Services:     Diagnosis:     Hyponatremia   Hypoglycemia (12/25/2018)    CKD (chronic kidney disease), stage IV ()   Essential hypertension (08/13/2016)    IDDM (insulin dependent diabetes mellitus) ()   Chronic gout (04/09/2018)       [employer] (Labs, Wounds, Infusions, etc.):    LAB draw CHEM 8 week of 01/01/2019, results to PCP    Additional Comments:  NO SOC CALL IS NEEDED    Questions Relating to COVID-19:    1. Have you been out of the country in the past 2 weeks?  No   2. Do you have any upper respiratory  symptoms (cough, SOB, fever)?  NO  3. Have you been exposed to anyone with COVID-19 virus? NO    Answer only if pending or positive for  COVID-19: N/A    1. Agreeable to wear PPE at each visit?  2.   Is the hospital supplying them with PPE upon discharge?  No    Discharge Date: 12/28/2018  Referral Source (PACC/Hospital/Unit): Marco Monte, RN  Referral Date: 12/28/18    Signed by: Marco Bruce  Date Time: 12/28/18 11:20 AM

## 2018-12-28 NOTE — Progress Notes (Signed)
RN utilized Electronics engineer Vaughan Basta), when administering morning medications. Pt refused all medications. Will continue to monitor.

## 2018-12-28 NOTE — UM Notes (Signed)
Hello    REF# [id number]   Clinical update for 12/27/18  C/b to Altha Harm [phone]    Thank-you!      In [facility] 12/27/18 :  Has pacemaker device that collects data at bedside.  Pt is confused and not a good historian.  Placed on 1200cc restriction.  Was coughing a lot (with copious oral secretions), but lung sounds diminished. MD aware and ordered chest x-ray. Suction set up at bedside.  Incontinent  Neg for COVID    98.3, HR 84, RR 18, BP 127/108-135/115-140/115, sats 98%  H&H 9.1/26.7, RBC 2.94, Glucose 141, BUN 29.3, Cr 1.8, Na 125, Chlor 97, CO2 19    CX[facility] 12/27/18 : 1. Pulmonary vascular congestion.  2. Cardiomegaly      MD assessment/plan 12/27/18 :  Hypoglycemia (12/25/2018)    Assessment: This is a 85 year old gentleman with type 2 insulin-dependent diabetes who presented to the emergency room with change in mental status.  At home patient's blood sugar was found to be very low, 27 and EMS gave glucagon IM nonetheless blood sugar was still low in the emergency room at 47.  There have been no alterations in the diabetic regimen.  His appetite has decreased but he still takes Lantus 25 units at bedtime and Tradjenta 5 mg daily.  Fluid consumption is been stable.  No recent illnesses.  In the emergency room patient took an D50 x2 and then had to be maintained on a D10 infusion given 1 dose of Solu-Medrol 125 mg.  He was then transferred to the [facility] on 12/25/2018 and transferred to a medical floor bed 12/26/2018.    Plan: I have spoken with the the daughter today who would like to get the patient home now that blood sugars have been stable.  I told her also that the patient is experiencing hypo-natremia as soon as this is improved we will discharge him.  We talked about decreasing his Lantus to 10 units at bedtime and using only when blood sugar at bedtime is greater than 160.  We will continue the Tradjenta.  She asked that I call this into the pharmacy upon discharge.   CKD (chronic kidney disease), stage IV ()     Assessment: With anemia of chronic disease and hemoglobin of about 9.  Creatinine on admission was 1.7 and normal is about 1.5.  Sodium levels are still low.  BNP levels are greater than 2000    Plan: Fluid restriction and monitor sodium levels   Essential hypertension (08/13/2016)    Assessment: Patient on low-dose metoprolol of 25 mg daily with good control    Plan:  No changes   IDDM (insulin dependent diabetes mellitus) ()    Assessment: Patient has no admissions for hypo glycemia.  Will return him to a regular diet that is carb restricted and follow his blood sugars.    Plan: Sliding scale insulin.   Chronic gout (04/09/2018)    Assessment: Patient is chronically taking allopurinol 100 mg daily.  History of gout flares.  Is related to his chronic kidney disease.    Plan: No change in medications.  Hyponatremia  Assessment: Patient sodium levels are low.  There is no medic occasions that could be doing this.  Sodium levels were normal in January  Plan: IV fluid hydration appears to be excellent and BNP is markedly elevated to 2000.  Salt tablets and fluid restriction.  Daughter knows that the level is low but still wants to take him home as he is fully  coherent and does not speak English and was feeling isolated in the hospital.  She agreed to wait till tomorrow and see with the lab level is prior to discharge.  DVT Prohylaxis:heparin       ORDERS, IMC, po Allopurinol, ASA, Neurontin, SC Heparin 5000units q12, SC HumaLog, Metoprolol, 1g po sodium chloride 3xs daily, pureed diet as tol, daily BMP

## 2018-12-29 ENCOUNTER — Telehealth (INDEPENDENT_AMBULATORY_CARE_PROVIDER_SITE_OTHER): Payer: Self-pay

## 2018-12-29 ENCOUNTER — Other Ambulatory Visit (INDEPENDENT_AMBULATORY_CARE_PROVIDER_SITE_OTHER): Payer: Self-pay | Admitting: Family Medicine

## 2018-12-29 DIAGNOSIS — M353 Polymyalgia rheumatica: Secondary | ICD-10-CM

## 2018-12-29 NOTE — Telephone Encounter (Signed)
Hospital D/C Template    Chart Review:     Type of Encounter  - Inpatient  Facility: [facility]  Discharge Date: 12/28/18  Primary Discharge Dx: Hypoglycemia, Hyponatremia- AMS   Acute renal failure, unspecified acute renal failure type    CKD (chronic kidney disease), stage IV    Hyponatremia    Essential hypertension    IDDM (insulin dependent diabetes mellitus)    Pacemaker    Chronic gout    Hypoglycemia    Elevated brain natriuretic peptide (BNP) level    Altered mental status, unspecified altered mental status type   Mild dementia  Bedbound status      Prior ER Visits/Hospitalizations (past year): ER x 3, hosp admit x 3  Follow Up Appt with PCP/Specialist: needs PCP fuv- hosp [place] fuv VIDEO vidit in 9/26 and labs due one week  [place] Ordered? [place] for hospital followup, spoke with dtr Patrcia Dolly, who is listed contact and caretaker, is at home with pt, also spoke with grandson Jamy Cleckler to verify meds in home and schedule FUV next week with PCP.       Patient Interview:    ? I heard your father  went to the hospital for confusion, not responding , what happened? confused over 1-2 days, lethargic, not responding, Ems to hospital, BG 27 by EMS  o How long were you in the hospital? 3 days  o What type of teaching did you receive regarding your symptoms/diagnosis? Low blood sugars and medicines reduced  o What symptoms do you have now, if any? dtr reports pt slept well back at home, is bed bound and dtr provides care at home. She says pt knows he is back home, no c/o pain or sob, has been more alert, eating pureed food and fluids, and BG 100-110.  Has a nebulizer machine at home, needs the meds.   ? Tell me about  your current medications and any changes the hospital made: [place] lantus insulin unless BG > 160 at HS, if resuming give reduced dose of 10 units hs  - If new medication prescribed: NACL 1000 mg one tab daily  - Tell me about the new medication and how you are to  take it? dtr did not seem aware of new meds, advised to review [place] [facility] paperwork for med list.   - What pharmacy did you use for your new medication(s)? rx were esent to [facility], dtr states she needs RX for NaCl, nebulizer meds, and prednisone [facility] instead, or she has to pay out of pocket. She has not picked any meds up since [place]. Will send pt request to PCP office  - What difficulty did you have, if any, picking up your medication(s)? dtr says rx were sent to wrong pharmacy, [facility] for financial reasons  o If the new medication is an injection: dtr admin insulin, now on PRN basis at HS only if BG > 160  ? What appointments have you made for follow-up? Scheduled [place], telemed video visit for 9/29, family uses zoom, email confimed  o What transportation options do you have to make it to your appointments? bedbound at home, very difficult to get out to office visits  o What assistance, if any, is available to you at home? Lives with dtr who is caretaker in home      Thank you for taking my call today! If there is  anything that you need, please give your office a call at [place], [phone].       *Instructed patient to call back if symptoms change or worsen and/or go to the emergency room or call 911 for emergency symptoms*

## 2019-01-01 ENCOUNTER — Other Ambulatory Visit (INDEPENDENT_AMBULATORY_CARE_PROVIDER_SITE_OTHER): Payer: Self-pay | Admitting: Family Medicine

## 2019-01-01 MED ORDER — PREDNISONE 5 MG PO TABS
5.00 mg | ORAL_TABLET | ORAL | 0 refills | Status: AC | PRN
Start: 2019-01-01 — End: ?

## 2019-01-01 MED ORDER — ALBUTEROL SULFATE (2.5 MG/3ML) 0.083% IN NEBU
2.50 mg | INHALATION_SOLUTION | Freq: Four times a day (QID) | RESPIRATORY_TRACT | 0 refills | Status: AC | PRN
Start: 2019-01-01 — End: ?

## 2019-01-01 MED ORDER — SODIUM CHLORIDE 1 G PO TABS
1.00 g | ORAL_TABLET | Freq: Every day | ORAL | 0 refills | Status: AC
Start: 2019-01-01 — End: ?

## 2019-01-01 NOTE — Telephone Encounter (Signed)
Left message for pt's daughter.  

## 2019-01-01 NOTE — Telephone Encounter (Signed)
Dr Herbert Pun  Dr Trixie Rude ordered these meds along with others.  Do I need to contact him to have them sent to the correct pharmacy?

## 2019-01-01 NOTE — Addendum Note (Signed)
Addended by: Ernest Mallick on: 01/01/2019 09:55 AM     Modules accepted: Orders

## 2019-01-01 NOTE — Telephone Encounter (Signed)
I will call to let him know.    Pt already has appt set up with you for tomorrow :-)

## 2019-01-01 NOTE — Telephone Encounter (Signed)
[facility]  Patient should have at least a video follow-up.  Need appointment with the log of his blood sugar during the visit.

## 2019-01-02 ENCOUNTER — Telehealth (INDEPENDENT_AMBULATORY_CARE_PROVIDER_SITE_OTHER): Payer: Self-pay | Admitting: Family Medicine

## 2019-01-02 ENCOUNTER — Telehealth (INDEPENDENT_AMBULATORY_CARE_PROVIDER_SITE_OTHER): Payer: 59 | Admitting: Family Medicine

## 2019-01-02 ENCOUNTER — Encounter (INDEPENDENT_AMBULATORY_CARE_PROVIDER_SITE_OTHER): Payer: Self-pay

## 2019-01-02 ENCOUNTER — Encounter (INDEPENDENT_AMBULATORY_CARE_PROVIDER_SITE_OTHER): Payer: Self-pay | Admitting: Family Medicine

## 2019-01-02 NOTE — Progress Notes (Signed)
Patient was scheduled as video visit for post hospital discharge.  Contacted patient and family members but unable to get the patient on the video visit.  Patient needs to be seen.  He is an insulin-dependent diabetic and need to have his blood sugar logs reviewed to adjust the medication and avoid recurrent episodes of hypoglycemia as it has been taking him into the hospital.

## 2019-01-02 NOTE — Progress Notes (Signed)
Tried calling pt to make sure he is set up on zoom.  Unable to reach pt.

## 2019-01-02 NOTE — Telephone Encounter (Signed)
Called pt to check status of getting on video visit today, pt daughter answered and advised to contact pt grandson who is the one who was helping pt with video call. Called pt grandson and there was no answer. LVM to call back.

## 2019-01-02 NOTE — Telephone Encounter (Signed)
Pt has appt today

## 2019-01-04 ENCOUNTER — Other Ambulatory Visit (INDEPENDENT_AMBULATORY_CARE_PROVIDER_SITE_OTHER): Payer: Self-pay | Admitting: Family Medicine

## 2019-01-04 DIAGNOSIS — Z794 Long term (current) use of insulin: Secondary | ICD-10-CM

## 2019-01-07 ENCOUNTER — Other Ambulatory Visit (INDEPENDENT_AMBULATORY_CARE_PROVIDER_SITE_OTHER): Payer: Self-pay | Admitting: Family Medicine

## 2019-01-07 DIAGNOSIS — M79605 Pain in left leg: Secondary | ICD-10-CM

## 2019-01-07 DIAGNOSIS — M170 Bilateral primary osteoarthritis of knee: Secondary | ICD-10-CM

## 2019-01-10 ENCOUNTER — Other Ambulatory Visit (INDEPENDENT_AMBULATORY_CARE_PROVIDER_SITE_OTHER): Payer: Self-pay | Admitting: Family Medicine

## 2019-01-10 DIAGNOSIS — I5022 Chronic systolic (congestive) heart failure: Secondary | ICD-10-CM

## 2019-01-10 DIAGNOSIS — I10 Essential (primary) hypertension: Secondary | ICD-10-CM

## 2019-01-10 DIAGNOSIS — Z794 Long term (current) use of insulin: Secondary | ICD-10-CM

## 2019-01-11 ENCOUNTER — Encounter (INDEPENDENT_AMBULATORY_CARE_PROVIDER_SITE_OTHER): Payer: Self-pay

## 2019-01-11 NOTE — Telephone Encounter (Signed)
Left a message for pts daughter to please call back.

## 2019-01-11 NOTE — Telephone Encounter (Signed)
He is an insulin-dependent diabetic with extreme low blood sugars and multiple hospital visits.  We have contacted the patient multiple times to set up a follow-up to discuss the blood sugar levels but unable to get an appointment.  Call the patient back again-family members to at least start a video visit to check on the need of insulin and how much needs to be given.  Pt need appt( atleast video) to be able to refill meds.

## 2019-01-12 ENCOUNTER — Encounter (INDEPENDENT_AMBULATORY_CARE_PROVIDER_SITE_OTHER): Payer: Self-pay

## 2019-01-12 ENCOUNTER — Telehealth (INDEPENDENT_AMBULATORY_CARE_PROVIDER_SITE_OTHER): Payer: Self-pay | Admitting: Family Medicine

## 2019-01-12 NOTE — Telephone Encounter (Signed)
Home Health reports that after receiving Blood Draw Order, she wqas unable to get enough blood.     2nd Attempt will be made today. Discharge is supposed to be for today. If she is unable to get the blood, patient will need to come into the lab for the draw.    Call Back  [phone]  Lelan Pons, ok to leave message

## 2019-01-12 NOTE — Telephone Encounter (Signed)
Please call the home health nurse.  Patient has not followed up with me in a long time.  He has been to the hospital multiple times with low blood sugar.  I need to talk with the family or at least do a video visit to decide how much insulin he is using based on his blood sugar.  I would appreciate if the home health nurse can fill in his blood sugar numbers for [place] and see if he is doing okay.

## 2019-01-12 NOTE — Telephone Encounter (Signed)
Spoke with Sharyn Lull, will advise

## 2019-01-12 NOTE — Telephone Encounter (Signed)
Spoke with Lelan Pons.  States they were no able to get the blood drawn.  I have called pts daughter Lambert Mody, unable to leave a message on cell as the mailbox is full.  Left a message on home phone.  Called pts grandson that is on last disclosure form and left a message to please have daughter call back.  Pt needs to schedule a visit as soon as possible.

## 2019-01-16 NOTE — Telephone Encounter (Signed)
LMTCB x2  

## 2019-01-24 ENCOUNTER — Other Ambulatory Visit (INDEPENDENT_AMBULATORY_CARE_PROVIDER_SITE_OTHER): Payer: Self-pay | Admitting: Family Medicine

## 2019-01-26 ENCOUNTER — Other Ambulatory Visit (INDEPENDENT_AMBULATORY_CARE_PROVIDER_SITE_OTHER): Payer: Self-pay | Admitting: Family Medicine

## 2019-01-26 DIAGNOSIS — E1122 Type 2 diabetes mellitus with diabetic chronic kidney disease: Secondary | ICD-10-CM

## 2019-01-26 NOTE — Telephone Encounter (Signed)
Please call the patient's family members.  I really need to see the patient at least by video or telephone visit to discuss his blood sugar levels to decide how much insulin he should be getting.   I will not be able to refill his insulin without knowing his sugar  Numbers and seeing how he is doing.need appt ASAP.

## 2019-02-01 NOTE — Telephone Encounter (Signed)
Tried calling daughters home and cell unable to leave a message on either line.

## 2019-02-09 ENCOUNTER — Telehealth (INDEPENDENT_AMBULATORY_CARE_PROVIDER_SITE_OTHER): Payer: Self-pay | Admitting: Family Medicine

## 2019-02-09 NOTE — Telephone Encounter (Signed)
Mr. Hippert daughter called stating that patient has been throwing up all week and is very weak.  Advised daughter to take Mr. Beedle to the ED for further evaluation due to his age and underlying health problems. Also asked daughter to schedule a follow-up after Mr. Gilreath is feeling better.    Daughter verbalized understanding.

## 2019-02-11 ENCOUNTER — Encounter (INDEPENDENT_AMBULATORY_CARE_PROVIDER_SITE_OTHER): Payer: Self-pay

## 2019-02-12 ENCOUNTER — Encounter (INDEPENDENT_AMBULATORY_CARE_PROVIDER_SITE_OTHER): Payer: Self-pay

## 2019-02-12 NOTE — Telephone Encounter (Signed)
Below letter will be mailed today        [facility]  [address]  [place] [address]  Phone: [phone]  Fax: [phone] February 12, 2019     [address]  [place] [address]  [phone] (home)   Telephone Information:   Mobile [phone]           Dear [name]:    We have been unable to reach you by phone.  It is important that we speak with you.    Please call our office at [phone].  Our office hours are Monday-Friday 8:00am-6:00pm.        Sincerely,        Gerhard Munch

## 2019-02-12 NOTE — Telephone Encounter (Signed)
Kindly send a certified mail for the patient.  Patient need to be seen, nurses have tried multiple times to reach patient also to arrange an video visit at least, to discuss current blood sugars to decide what dose of insulin the patient should be on.  Please send certified mail thank you

## 2019-02-12 NOTE — Telephone Encounter (Signed)
Certified letter sent 

## 2019-02-12 NOTE — Telephone Encounter (Signed)
Dr. Herbert Pun,    We have been trying to reach pt to schedule a follow up appt. Left message to call back and schedule appt.     Pls advise,  Thanks.

## 2019-02-15 ENCOUNTER — Telehealth (INDEPENDENT_AMBULATORY_CARE_PROVIDER_SITE_OTHER): Payer: Self-pay | Admitting: Family Medicine

## 2019-02-15 NOTE — Telephone Encounter (Signed)
Pt scheduled 02/20/2019

## 2019-02-15 NOTE — Telephone Encounter (Signed)
Patient needs to be seen for follow up hospital visit and is requesting to be squeezed in for a sooner appointment than available. Please return call at noted phone number below.    Patient's daughter [name] declined to schedule next available appointment.      Note:  Daughter stated the patient was admitted to the hospital on 12/25/18 and discharged on 12/28/18.     Patient Preferred Callback Number:  [phone]

## 2019-02-20 ENCOUNTER — Encounter (INDEPENDENT_AMBULATORY_CARE_PROVIDER_SITE_OTHER): Payer: Self-pay | Admitting: Family Medicine

## 2019-02-20 ENCOUNTER — Other Ambulatory Visit (INDEPENDENT_AMBULATORY_CARE_PROVIDER_SITE_OTHER): Payer: Self-pay | Admitting: Family Medicine

## 2019-02-20 ENCOUNTER — Telehealth (INDEPENDENT_AMBULATORY_CARE_PROVIDER_SITE_OTHER): Payer: 59 | Admitting: Family Medicine

## 2019-02-20 DIAGNOSIS — M170 Bilateral primary osteoarthritis of knee: Secondary | ICD-10-CM

## 2019-02-20 DIAGNOSIS — M1A00X0 Idiopathic chronic gout, unspecified site, without tophus (tophi): Secondary | ICD-10-CM

## 2019-02-20 DIAGNOSIS — E1122 Type 2 diabetes mellitus with diabetic chronic kidney disease: Secondary | ICD-10-CM | POA: Insufficient documentation

## 2019-02-20 DIAGNOSIS — M79605 Pain in left leg: Secondary | ICD-10-CM

## 2019-02-20 DIAGNOSIS — I10 Essential (primary) hypertension: Secondary | ICD-10-CM

## 2019-02-20 NOTE — Progress Notes (Signed)
Date: 02/20/2019 9:51 AM   Patient ID: Marco Bruce is a 85 y.o. male.    Chief Complaint:  Chief Complaint   Patient presents with    Diabetes    Follow-up       HPI:    Telemedicine Documentation Requirements    Originating site (Patient location): Home  Distant site (Provider location): [facility]   Provider and Title: Dr Vedia Coffer MD  Consent obtained: YES/NO: Yes  Language, if applicable and if translator was required: English    This visit is being conducted via video and telephone.   Verbal consent has been obtained from the patient to conduct a video and telephone visit to minimize exposure to COVID-19: yes,      85 year old male with insulin-dependent diabetes doing a telemedicine follow-up, in the presence of grandson and daughter  Patient has been an insulin-dependent diabetic for many years, PMR,chronic gout    Not doing welling per family  Been resting a lot  PO intake is reduced    Cough occasional phlegm but unable to bring it out- 1 week  No fever  No SOB- usually sleeps with back rested  No diarrhea    DM- lately blood sugar very low and occasionally in 90's fasting  Today AM 95- so not taking any Insulin  Also check sugar at dinner time and still in the 90,s    Hosp gave him NA bicarb but he throws up after taking it - so they stopped giving it to him    discussed with daughter and grandson about the care he is getting on his medication reconciliation  According to them there have not been giving any medication except his metoprolol  Discussed with daughter about the kind of care they want him to have in case if he ends up at the hospital  Daughter does not want the patient to go on a ventilator, states that she just want him to be kept comfortable  HPI       The other 14 point review systems are negative except those in HPI.    Current Medications:  No outpatient medications have been marked as taking for the 02/20/19 encounter (Telemedicine Visit) with Danella Sensing, MD.       Past Medical History:  Past Medical History:   Diagnosis Date    Acute diastolic heart failure 16/01/9603    Acute on chronic diastolic congestive heart failure 08/13/2016    Acute pulmonary edema 05/26/2015    Chronic diastolic heart failure 54/12/8117    Chronic gout     Chronic pain syndrome 07/07/2015    CKD (chronic kidney disease) stage 4, GFR 15-29 ml/min     2018 baseline creatinine in EPIC is 1.5-2.3.    Congestive heart failure (CHF)     EF 40% 5/18 echo, on lasix    Current chronic use of systemic steroids     For Polymyalgia rheumatica    Essential hypertension     Mixed hyperlipidemia     NSTEMI (non-ST elevated myocardial infarction) 08/13/2016    Osteoarthritis of both knees     Peripheral edema 06/16/2015    Pneumonia due to infectious organism, unspecified laterality, unspecified part of lung 06/25/2015    Polymyalgia rheumatica     chronic steroids    Renovascular hypertension 08/04/2015    Second degree heart block by electrocardiogram (ECG) 04/27/2015    Type 2 diabetes mellitus with complication, with long-term current use of insulin  12/01/2016 Hemoglobin A1c 8.2%       Past Surgical History:  Past Surgical History:   Procedure Laterality Date    ABDOMINAL SURGERY      CHOLECYSTECTOMY      PACEMAKER Left 2017       Family History:  Family History   Problem Relation Age of Onset    Hypertension Father        Social History:   reports that he has never smoked. He has never used smokeless tobacco. He reports that he does not drink alcohol or use drugs.    Allergies:  No Known Allergies    The following sections were reviewed this encounter by the provider:   Tobacco   Allergies   Meds   Problems   Med Hx   Surg Hx   Fam Hx          VITALS:  There were no vitals filed for this visit.     REVIEW OF SYSTEMS:  Review of Systems   Constitutional: Negative for fever.   HENT: Negative for congestion, sinus pain and sore throat.    Eyes: Negative for double  vision.   Respiratory: Positive for cough.         States it is just a mild cough like he wants to clear something up in his chest but not able to bring it up   Cardiovascular: Negative for chest pain.   Gastrointestinal: Negative for constipation and heartburn.        PHYSICAL EXAM:  Physical Exam   Patient was noted to be resting in bed with his upper back elevated  When the grandson calls his name he opens his eyes    Lab Results   Component Value Date    WBC 5.71 12/27/2018    HGB 9.1 (L) 12/27/2018    HCT 26.7 (L) 12/27/2018    PLT 207 12/27/2018    CHOL 113 (L) 05/31/2006    TRIG 125 05/31/2006    LDL 45 (L) 05/31/2006    ALT <6 12/26/2018    AST 21 12/26/2018    NA 133 (L) 12/28/2018    K 4.2 12/28/2018    CL 105 12/28/2018    CREAT 1.6 (H) 12/28/2018    BUN 30.8 (H) 12/28/2018    CO2 20 (L) 12/28/2018    TSH 2.54 06/02/2017    PSA 1.0 01/12/2006    INR 1.1 04/26/2018    GLU 117 (H) 12/28/2018    HGBA1C 6.2 (H) 04/11/2018         ASSESSMENT AND PLAN:  1. Essential hypertension    2. Type 2 diabetes mellitus with diabetic chronic kidney disease, unspecified CKD stage, unspecified whether long term insulin use    3. Idiopathic chronic gout without tophus, unspecified site     Patient currently at home  Has poor p.o. intake and has not been taking any medications except for his metoprolol  Spoke with daughter at length who opts her daughter to be kept comfortable  If he ends up at the hospital she defers use of any ventilator for her dad  Yolanda Bonine was present during the entire visit  Inquired if they need any palliative care-seems very hard for them to understand this  After long discussion the daughter gave me a phone number to contact as they have some nursing aide who comes to check on [phone]  I did call the number and I was told that he is not receiving any services from them,  and usually they provide him with [place] nurse and at this time they have no staff to provide him.  We will try to just  talk with the family and see if we can arrange home health services  Mild cough -could use prn neb Rx         FOLLOW-UP:  No follow-ups on file.     Time spent in discussion: 21 to 30 minutes        This note was generated by the Epic EMR system/ Dragon speech recognition and may contain inherent errors or omissions not intended by the user. Grammatical errors, random word insertions, deletions, pronoun errors and incomplete sentences are occasional consequences of this technology due to software limitations. Not all errors are caught or corrected. If there are questions or concerns about the content of this note or information contained within the body of this dictation they should be addressed directly with the author for clarification      Egan Sahlin Sidonie Dickens, MD

## 2019-02-22 ENCOUNTER — Other Ambulatory Visit (INDEPENDENT_AMBULATORY_CARE_PROVIDER_SITE_OTHER): Payer: Self-pay | Admitting: Family Medicine

## 2019-02-22 DIAGNOSIS — E1122 Type 2 diabetes mellitus with diabetic chronic kidney disease: Secondary | ICD-10-CM

## 2019-02-22 DIAGNOSIS — N184 Chronic kidney disease, stage 4 (severe): Secondary | ICD-10-CM

## 2019-02-23 ENCOUNTER — Encounter (INDEPENDENT_AMBULATORY_CARE_PROVIDER_SITE_OTHER): Payer: Self-pay | Admitting: Family Medicine

## 2019-02-25 ENCOUNTER — Emergency Department
Admission: EM | Admit: 2019-02-25 | Discharge: 2019-03-06 | Disposition: E | Payer: 59 | Attending: Emergency Medicine | Admitting: Emergency Medicine

## 2019-02-25 ENCOUNTER — Emergency Department: Payer: 59

## 2019-02-25 DIAGNOSIS — N184 Chronic kidney disease, stage 4 (severe): Secondary | ICD-10-CM | POA: Insufficient documentation

## 2019-02-25 DIAGNOSIS — I5032 Chronic diastolic (congestive) heart failure: Secondary | ICD-10-CM | POA: Insufficient documentation

## 2019-02-25 DIAGNOSIS — I469 Cardiac arrest, cause unspecified: Secondary | ICD-10-CM | POA: Insufficient documentation

## 2019-02-25 DIAGNOSIS — I252 Old myocardial infarction: Secondary | ICD-10-CM | POA: Insufficient documentation

## 2019-02-25 DIAGNOSIS — Z7952 Long term (current) use of systemic steroids: Secondary | ICD-10-CM | POA: Insufficient documentation

## 2019-02-25 DIAGNOSIS — E1122 Type 2 diabetes mellitus with diabetic chronic kidney disease: Secondary | ICD-10-CM | POA: Insufficient documentation

## 2019-02-25 DIAGNOSIS — Z8249 Family history of ischemic heart disease and other diseases of the circulatory system: Secondary | ICD-10-CM | POA: Insufficient documentation

## 2019-02-25 DIAGNOSIS — I130 Hypertensive heart and chronic kidney disease with heart failure and stage 1 through stage 4 chronic kidney disease, or unspecified chronic kidney disease: Secondary | ICD-10-CM | POA: Insufficient documentation

## 2019-02-25 DIAGNOSIS — Z7982 Long term (current) use of aspirin: Secondary | ICD-10-CM | POA: Insufficient documentation

## 2019-02-25 DIAGNOSIS — Z950 Presence of cardiac pacemaker: Secondary | ICD-10-CM | POA: Insufficient documentation

## 2019-02-25 DIAGNOSIS — M353 Polymyalgia rheumatica: Secondary | ICD-10-CM | POA: Insufficient documentation

## 2019-02-25 DIAGNOSIS — E782 Mixed hyperlipidemia: Secondary | ICD-10-CM | POA: Insufficient documentation

## 2019-02-25 DIAGNOSIS — Z794 Long term (current) use of insulin: Secondary | ICD-10-CM | POA: Insufficient documentation

## 2019-02-27 ENCOUNTER — Telehealth: Payer: Self-pay | Admitting: Family Medicine

## 2019-02-27 NOTE — Telephone Encounter (Signed)
noted 

## 2019-02-27 NOTE — Telephone Encounter (Signed)
Death certificate electronically signed off by me today on [facility] website electronically. Thanks

## 2019-02-27 NOTE — Telephone Encounter (Signed)
Rep from [employer] [facility] called to let Dr. Herbert Pun know patient had passed a way and they are going to send her a death certificate on death certificate portal online so she can sign off on it. Please advise.     Their call back number: [phone]    Thank you.

## 2019-03-06 NOTE — Code Documentation (Addendum)
Time of Death 07/21/2323

## 2019-03-06 NOTE — ED Provider Notes (Signed)
EMERGENCY DEPARTMENT HISTORY AND PHYSICAL EXAM    Date Time: 02/26/19 12:55 AM  Patient Name: [name], 85 y.o., male  ED Provider: C. Ladeja Pelham, MD.    History of Presenting Illness:     Chief Complaint: cardiac arrest  History obtained from: Patient.  Narrative/Additional Historical Findings:Marco Bruce is a 85 y.o. male  With pmhx of NSTEMIU, CHF, pacer, CKD, DM2 presents via EMS with a witness out of hospital cardiac arrest.  Per EMS he was vomiting and then went unresponsive.  Pulseless upon arrival, CPR started, epi x 3 with ROSC.  IO in left shoulder. Unknown advanced directive, family thinks maybe DNR but was unable to locate the DNR.    Nursing notes from this date of service were reviewed.    Past Medical History:     Past Medical History:   Diagnosis Date    Acute diastolic heart failure 90/21/1155    Acute on chronic diastolic congestive heart failure 08/13/2016    Acute pulmonary edema 05/26/2015    Chronic diastolic heart failure 20/80/2233    Chronic gout     Chronic pain syndrome 07/07/2015    CKD (chronic kidney disease) stage 4, GFR 15-29 ml/min     2018 baseline creatinine in EPIC is 1.5-2.3.    Congestive heart failure (CHF)     EF 40% 5/18 echo, on lasix    Current chronic use of systemic steroids     For Polymyalgia rheumatica    Essential hypertension     Mixed hyperlipidemia     NSTEMI (non-ST elevated myocardial infarction) 08/13/2016    Osteoarthritis of both knees     Peripheral edema 06/16/2015    Pneumonia due to infectious organism, unspecified laterality, unspecified part of lung 06/25/2015    Polymyalgia rheumatica     chronic steroids    Renovascular hypertension 08/04/2015    Second degree heart block by electrocardiogram (ECG) 04/27/2015    Type 2 diabetes mellitus with complication, with long-term current use of insulin     12/01/2016 Hemoglobin A1c 8.2%       Past Surgical History:     Past Surgical History:   Procedure Laterality Date    ABDOMINAL SURGERY       CHOLECYSTECTOMY      PACEMAKER Left 2017       Family History:     Family History   Problem Relation Age of Onset    Hypertension Father        Social History:     Social History     Socioeconomic History    Marital status: Widowed     Spouse name: Not on file    Number of children: Not on file    Years of education: Not on file    Highest education level: Not on file   Occupational History    Not on file   Social Needs    Financial resource strain: Not on file    Food insecurity     Worry: Not on file     Inability: Not on file    Transportation needs     Medical: Not on file     Non-medical: Not on file   Tobacco Use    Smoking status: Never Smoker    Smokeless tobacco: Never Used   Substance and Sexual Activity    Alcohol use: No     Comment: former alcoholic    Drug use: No    Sexual activity: Not on file   Lifestyle  Physical activity     Days per week: Not on file     Minutes per session: Not on file    Stress: Not on file   Relationships    Social connections     Talks on phone: Not on file     Gets together: Not on file     Attends religious service: Not on file     Active member of club or organization: Not on file     Attends meetings of clubs or organizations: Not on file     Relationship status: Not on file    Intimate partner violence     Fear of current or ex partner: Not on file     Emotionally abused: Not on file     Physically abused: Not on file     Forced sexual activity: Not on file   Other Topics Concern    Not on file   Social History Narrative    Not on file       Allergies:   No Known Allergies    Medications:   No current facility-administered medications for this encounter.     Current Outpatient Medications:     albuterol (PROVENTIL) (2.5 MG/3ML) 0.083% nebulizer solution, Take 3 mLs (2.5 mg total) by nebulization every 6 (six) hours as needed for Wheezing, Disp: 50 each, Rfl: 0    allopurinol (ZYLOPRIM) 100 MG tablet, Take 1 tablet (100 mg total) by mouth daily,  Disp: 90 tablet, Rfl: 3    aspirin 81 MG chewable tablet, Chew 1 tablet (81 mg total) by mouth daily, Disp: , Rfl:     gabapentin (NEURONTIN) 100 MG capsule, TAKE ONE CAPSULE BY MOUTH EVERY 8 HOURS, Disp: 90 capsule, Rfl: 0    insulin glargine (LANTUS SOLOSTAR) 100 UNIT/ML injection pen, Inject 10 Units into the skin nightly Check blood sugar prior to administration.Give only if BS > 160., Disp: 27 mL, Rfl: 0    linaGLIPtin 5 MG Tab, Take 1 tablet (5 mg total) by mouth daily, Disp: 30 tablet, Rfl: 0    metoprolol succinate XL (TOPROL-XL) 25 MG 24 hr tablet, Take 1 tablet (25 mg total) by mouth daily Hold for SBP less than 100 or HR less than 60 Do not crush or chew., Disp: , Rfl:     predniSONE (DELTASONE) 5 MG tablet, Take 1 tablet (5 mg total) by mouth as needed (gout), Disp: 30 tablet, Rfl: 0    sodium chloride 1 g tablet, Take 1 tablet (1 g total) by mouth daily, Disp: 30 tablet, Rfl: 0    Review of Systems:   Review of Systems   UTO due to cardiac arrest        Physical Exam:     ED Triage Vitals   Enc Vitals Group      BP       Pulse       Resp       Temp       Temp src       SpO2       Weight       Height       Head Circumference       Peak Flow       Pain Score       Pain Loc       Pain Edu?       Excl. in [place]?      Physical Exam   Constitutional: unresponsive, intubated  Head: Normocephalic  and atraumatic.   Eyes: Pupils fixed and dilated  Neck: . Neck supple.   Cardiovascular: Linton Rump device on patient, no pulses palpated upon arrival to ED  Pulmonary/Chest: Intubated, good chest rise, coarse BS BL   Abdominal: Soft.   Musculoskeletal: No deformity  Neurological: GCS2T  Skin: Skin is warm and dry. Not diaphoretic.     Nursing note and vitals reviewed.    Labs:   Labs Reviewed - No data to display      Rads:     Radiology Results (24 Hour)     ** No results found for the last 24 hours. **          Medications   glucagon (rDNA) (GLUCAGEN) 1 MG injection (has no administration in time range)   dextrose 50  % bolus (has no administration in time range)       MDM and ED Course   C. Juliana Boling, M.D., is the primary attending for this patient and has obtained and performed the history, PE, and medical decision making for this patient.    Oxygen saturation by pulse oximetry is <91%, Hypoxic.  Interventions: Airway Management      MDM:  Pt arrived in ED s/p ROSC per EMS but in the ED he was without palpable pulses, CPR resumed.  Discussed with family about wishes, family states he didn't want life support and wanted to go peacefully.   Daughter brought to bedside during CPR, she stated he would not want that and to stop.  Time of death 23:24.   Pacer turned off with magnet.         Assessment/Plan:   Results and instructions reviewed at the bedside with patient and family.    Clinical Impression  Final diagnoses:   Cardiac arrest       Disposition  ED Disposition     ED Disposition Condition Date/Time Comment    Expired  03-04-19 11:59 PM Reminder: Complete Death and Date Time in dedicated section below           Prescriptions  New Prescriptions    No medications on file           Signed by: C. Wentworth Edelen, MD       Orenthal Debski, Murvin Natal, MD  02/26/19 [phone]

## 2019-03-06 NOTE — ED Triage Notes (Signed)
EMS call for cardiac arrest at 2231, pt received 3 rounds of EPI and obtained ROSC and lost it upon arrival to [facility] ER. PT intubated and had L shoulder IO access.

## 2019-03-06 DEATH — deceased

## 2020-07-08 NOTE — Progress Notes (Signed)
[facility] OFFICE  [id number] [place], [place] [address]     [name] [facility]    Date of Visit: 06/22/2017   Date of Birth: 05/04/1926  Age: 87 yrs.  Medical  Record Number: [id number]  __  CURRENT DIAGNOSES     1. 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 CHF chronic diastolic, 99991111  3. Abnormal electrocardiogram, R94.31  4. Device check cardiac pacemaker, Z45.010  5. Status post Pacemaker, Z95.0  __  ALLERGIES     No Known Allergies  __  MEDICATIONS     1. acetaminophen 325 mg tablet, 2 tablets q4 hours,  prn  2. allopurinol 100 mg tablet, qd  3. atorvastatin 20 mg tablet, 1 po qd  4. cetirizine 10 mg tablet, 1 po qd prn  5. cyclobenzaprine 10 mg tablet, qhs prn  6. docusate sodium 100 mg capsule, 1 po bid  7. furosemide 20 mg tablet,  2 po qd  8. gabapentin 100 mg capsule, 1 po q 8 hours  9. guaifenesin 200 mg tablet, use as directed  10. Humalog U-100 Insulin 100 unit/mL cartridge, 1 to 3 units qhs  11. hydralazine 50 mg tablet, 1 po qd  12. hypromellose 2.5 %  eye drops, use as directed  13. insulin glargine 100 unit/mL subcutaneous solution, 8 units sq qhs  14. magnesium hydroxide 400 mg/5 mL oral suspension, Use as directed  15. pantoprazole 40 mg tablet,delayed release, 1 po qd  16. prednisolone  5 mg tablet, qd  17. ProAir HFA 90 mcg/actuation HFA aerosol inhaler, Use as directed  18. Procardia XL 60 mg tablet,extended release, 2 po qam  19. Risaquad 8 billion cell capsule, 1 po qd  20. Toprol XL 25 mg tablet,extended release,  1 po qd  21. Tradjenta 5 mg tablet, qd  22. valsartan 80 mg tablet, qd  __  [facility] Follow up  __   Pepin  Mr. Willits is an 87 year old male coming in today after a recent hospitalization for acute on chronic combined CHF, SIRS and acute renal failure. The patient was accompanied by  his grandson today who aids in translation. The patient and grandson state that overall he is doing much better. Weights have been  remaining stable. He denies any shortness of breath, PND, orthopnea or lower extremity edema. He is continuing to take his  medications as prescribed and is tolerating them well. The patient has plans to follow up with Nephrology as an outpatient.  __  PAST HISTORY      Past Medical Illnesses: CHF, HTN, DM, CKD stage IV, HLD;  Past Cardiac Illnesses : NSTEMI; Infectious Diseases: No previous history of significant infectious diseases.; Surgical Procedures : abdominal surgery, Pacemaker 2017; Trauma History: No previous history of significant trauma.; Cardiology  Procedures-Invasive: No previous interventional or invasive cardiology procedures.; Cardiology Procedures-Noninvasive : Echo Feb 2017, May 2018; Left Ventricular Ejection Fraction: LVEF of 40% documented via echocardiogram on 08/14/2016;  Peripheral Vascular Procedures: Venous US 05/27/15  ___   FAMILY HISTORY  Father -- No relevant family history     __  CARDIAC RISK FACTORS     Tobacco Abuse: used to smoke, but quit;  Family History of Heart Disease: no family history of cardiovascular disease; Hyperlipidemia: positive;  Hypertension: positive;  Diabetes Mellitus: positive;  Prior History of Heart Disease: negative; Obesity: negative;  Sedentary Life Style:positive; [id number]  __   SOCIAL HISTORY    Alcohol  Use: history of alcohol abuse, currently not drinking;  Smoking: Does not smoke; Never smoker ([id number]); Diet: Regular diet;  Exercise: No regular exercise;   __  PHYSICAL EXAMINATION     Vital Signs:  Blood Pressure:  152/90 Sitting, Left arm, regular cuff  156/90 Sitting, Right arm,  regular cuff    Weight: 150.40 lbs.  Height: 60"   BMI: 29   Pulse: 76/min. Apical        Constitutional: cooperative, in no acute distress Skin: warm and dry to touch  Head: normocephalic Eyes: conjunctivae and lids normal  ENT: No pallor or cyanosis Neck:  carotid pulses are full and equal bilaterally, no bruits Chest: clear to auscultation bilaterally, rhonchi  which clear with cough  Cardiac: Regular rhythm systolic ejection murmur, grade 3/6 systolic ejection murmur Abdomen: abdomen normal  Peripheral Pulses: bilateral radial pulse(s) 2+ Extremities/Back : No deformities,clubbing, erythema or edema observed Neurological: affect appropriate   __     Medications added today by the physician:      IMPRESSIONS:  1. Recent hospitalization for acute  on chronic combined congestive heart failure, currently  euvolemic. The last 2D echocardiogram done in May of 2018 showed an ejection fraction  of 40%.  2. Moderate to severe valvular disease with moderate to severe mitral regurgitation, mild   aortic regurgitation and elevated pulmonary artery pressure at 56 mmHg.  3. Status post pacemaker placement in November of 2017.  4. Chronic kidney disease with plans to follow up with Nephrology.  5. Hypertension, slightly above goal today.  Pressures at home are better controlled.  6. Diabetes.  7. Anemia.  8. History of tobacco abuse.  9. History of hyponatremia.    RECOMMENDATIONS:   1. He will continue current medications.  2. Discussed signs or symptoms and of congestive heart failure. The patient is to contact  us if he develops any of these prior to congestive heart failure follow up in one month.   3. The patient  is planned to have a 2D echocardiogram performed in early April to reevaluate  his ejection fraction and valvular disease.  4. We will see the patient back in the congestive heart failure clinic in four weeks.    Karren Cobble, [place]     AB/tuld    cc: CYNTHIA J. LEE DO    MG   ____________________________  TODAYS ORDERS   Heart Failure Clinic Visit 1 month

## 2020-07-08 NOTE — Progress Notes (Signed)
[facility] OFFICE  [id number] [facility] [address] [place], [place] [address]     [name] [facility]    Date of Visit: 01/11/2017   Date of Birth: 01/27/1927  Age: 87 yrs.  Medical  Record Number: [id number]  __  CURRENT DIAGNOSES     1. CHF chronic diastolic, 99991111  2. Status  post Pacemaker, Z95.0  3. 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 Abnormal electrocardiogram, R94.31  __  ALLERGIES     No Known Allergies  __  MEDICATIONS     1. allopurinol 100 mg tablet, qd  2. atorvastatin  20 mg tablet, 1 po qd  3. chlorthalidone 50 mg tablet, qd  4. cyclobenzaprine 10 mg tablet, qhs prn  5. furosemide 40 mg tablet, qd  6. gabapentin 100 mg capsule, tid  7. hydralazine 50 mg tablet, 1 po qd  8. insulin glargine 100  unit/mL subcutaneous solution, 8 units sq qhs  9. pantoprazole 40 mg tablet,delayed release, 1 po bid  10. prednisolone 5 mg tablet, qd  11. Procardia XL 60 mg tablet,extended release, daily  12. Toprol XL 25 mg tablet,extended release,  1 po qd  13. Tradjenta 5 mg tablet, qd  14. valsartan 80 mg tablet, qd  __  CHIEF COMPLAINT/REASON FOR VISIT  Followup of CHF acute on  chronic diastolic  __  HISTORY OF PRESENT ILLNESS  Mr. Friedly is a pleasant 87 year old gentleman accompanied by his son who translates for him today.  The patient had an office visit in March of 2017. He has not followed up in the office after that. We did see him in May of 2018 when he was hospitalized with what appears to be pneumonia. The patient has been relatively well according to his son since  then. He has not had any chest pain or shortness of breath. His weight has been stable and blood pressure has been under control. He is following with other providers to manage his hypertension. The patient also had a pacemaker put in, in November of  2017 and it does not appear that he has had this checked. When he was hospitalized in May, an echocardiogram revealed a drop in his ejection fraction to 40%. It was decided to treat this medically   __    PAST HISTORY     Past Medical Illnesses: CHF, HTN, DM, CKD stage IV, HLD;   Past Cardiac Illnesses: NSTEMI; Infectious Diseases : No previous history of significant infectious diseases.; Surgical Procedures: abdominal surgery, Pacemaker 2017;  Trauma History: No previous history of significant trauma.; Cardiology Procedures-Invasive: No previous  interventional or invasive cardiology procedures.; Cardiology Procedures-Noninvasive: Echo Feb 2017, May 2018;  Left Ventricular Ejection Fraction: LVEF of 40% documented via echocardiogram on 08/14/2016; Peripheral Vascular Procedures : Venous US 05/27/15  ___  FAMILY HISTORY   Father -- No relevant family history    __  CARDIAC RISK FACTORS      Tobacco Abuse: used to smoke, but quit; Family History of Heart Disease: no family history of cardiovascular  disease; Hyperlipidemia: positive; Hypertension: positive;   Diabetes Mellitus: positive; Prior History of Heart Disease : negative; Obesity: negative; Sedentary Life Style :positive; [id number]  __  SOCIAL HISTORY     Alcohol Use: history of alcohol abuse, currently not drinking; Smoking: Does not smoke; Never smoker ([id number]);  Diet: Regular diet; Exercise: No regular exercise;   __   PHYSICAL EXAMINATION    Vital Signs:  Blood Pressure:   146/62 Sitting, Left arm, regular  cuff  140/70 Sitting, Right arm, regular cuff    Weight: 161.00 lbs.   Height: 60"  BMI: 31   Pulse:  64/min. Apical Irregular       Constitutional: cooperative, in no acute distress  Skin: warm and dry to touch Head: normocephalic  Eyes: conjunctivae and lids normal ENT: No pallor or  cyanosis Neck: carotid pulses are full and equal bilaterally, no bruits  Chest: clear to auscultation bilaterally, rhonchi which clear with cough Cardiac: Regular rhythm systolic  ejection murmur, grade 3/6 systolic ejection murmur Abdomen: abdomen normal Peripheral Pulses : bilateral radial pulse(s) 2+ Extremities/Back: 2-3Z+ LE edema  Neurological: affect  appropriate   __    Medications added today by the physician:       IMPRESSIONS:  1. Chronic diastolic congestive heart failure.   a. Left ventricular ejection fraction 75% by echocardiogram in 2017, reduced to   40% by echocardiogram in May of 2018.  2. Moderate  to severe valvular disease.  a. Moderate to severe mitral valve regurgitation.  b. Mild aortic valve regurgitation.  c. Pulmonary artery pressure 56 mmHg in May 2018.  3. 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  4. Chronic kidney disease.   5. Hypertension.  6. Diabetes.  7. Anemia.  8. Former tobacco use.    RECOMMENDATIONS:  Mr. Dantin denies active cardiac symptoms. He is  on aggressive medical therapy for blood pressure control. Given his advanced age, further workup for the drop in ejection fraction had been deferred in the May hospitalization. We will reevaluate his ejection fraction by echocardiogram in six months when  I follow up with him. He should also have his pacemaker interrogated and we will make the arrangements to do so.    Amit Leece A. Jessy Oto, M.D., F.A.C.C.    TAZ/tuld     cc: CYNTHIA J. LEE DO    AP

## 2020-07-08 NOTE — Progress Notes (Signed)
[facility] OFFICE  [id number] [facility]. [address] [place], [place] [address]     [name] [facility]    Date of Visit: 01/13/2018   Date of Birth: 05-27-1926  Age: 87 yrs.  Medical  Record Number: [id number]  __  CURRENT DIAGNOSES     1. 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 CHF chronic combined systolic and diastolic, 123456  3. Abnormal electrocardiogram, R94.31  4. Status post Pacemaker, Z95.0  5. Device check cardiac pacemaker, Z45.010  __  ALLERGIES     No Known Allergies  __  MEDICATIONS     1. acetaminophen 325 mg tablet, 2 tablets q4 hours,  prn  2. allopurinol 100 mg tablet, qd  3. Aspir-81 mg tablet,delayed release, 1 po qd  4. atorvastatin 20 mg tablet, 1 po qd  5. cetirizine 10 mg tablet, 1 po qd prn  6. furosemide 20 mg tablet, 2 po qd  7. gabapentin 100 mg capsule,  1 po q 8 hours  8. Humalog U-100 Insulin 100 unit/mL cartridge, 1 to 3 units qhs  9. hypromellose 2.5 % eye drops, use as directed  10. insulin glargine 100 unit/mL subcutaneous solution, 8 units sq qhs  11. magnesium hydroxide 400 mg/5  mL oral suspension, Use as directed  12. pantoprazole 40 mg tablet,delayed release, 1 po qd  13. prednisolone 5 mg tablet, qd  14. ProAir HFA 90 mcg/actuation HFA aerosol inhaler, Use as directed  15. Risaquad 8 billion cell capsule, 1  po qd  16. sodium bicarbonate 325 mg tablet, 2 po qd  17. Toprol XL 25 mg tablet,extended release, 1 po qd  18. Tradjenta 5 mg tablet, qd  __  CHIEF COMPLAINT/REASON [name]  __  [place]  Marco Bruce is an 87 year old male coming in today after a recent hospitalization for acute on  chronic combined diastolic and systolic congestive heart failure. The patient was diuresed with IV Lasix. He did have significant rise in creatine but this did come back to his baseline around 2.6. The patients weight is back to baseline around 150. He  had put on about 20-30 pounds prior to his recent admission. The patient is accompanied by a family member today  who does aid in translation. They both state that he is doing well. He denies any shortness of breath, orthopnea or PND or lower extremity  edema. He is watching the salt intake in his diet.  __  PAST HISTORY     Past Medical Illnesses : CHF, HTN, DM, CKD stage IV, HLD;  Past Cardiac Illnesses: NSTEMI;  Infectious Diseases: No previous history of significant infectious diseases.; Surgical Procedures: abdominal  surgery, Pacemaker 2017; Trauma History: No previous history of significant trauma.; Cardiology Procedures-Invasive : No previous interventional or invasive cardiology procedures.; Cardiology Procedures-Noninvasive: Echo Feb 2017, May 2018, April2019;  Left Ventricular Ejection Fraction: LVEF of 40% documented via echocardiogram on 07/08/2017; Peripheral Vascular Procedures : Venous US 05/27/15, Renal ultrasound 12/06/17  ___  FAMILY HISTORY  Father --  No relevant family history    __  CARDIAC RISK FACTORS     Tobacco Abuse: used to smoke, but quit;  Family History of Heart Disease: no family history of cardiovascular disease; Hyperlipidemia: positive;  Hypertension: positive;  Diabetes Mellitus: positive;  Prior History of Heart Disease: negative; Obesity: negative;  Sedentary Life Style:positive; [id number]  __   SOCIAL HISTORY    Alcohol Use: history of alcohol abuse, currently not  drinking;  Smoking: Does not smoke; Never smoker ([id number]); Diet: Regular diet;  Exercise: No regular exercise;   __  PHYSICAL EXAMINATION    Vital Signs:   Blood Pressure:  140/62 Sitting, Left arm, regular cuff    Weight: 151.40 lbs.   Height: 60"  BMI: 29.57   Pulse:  60/min. Apical Regular       Constitutional: cooperative, in no acute distress Skin:  warm and dry to touch Head: normocephalic Eyes : conjunctivae and lids normal ENT: No pallor or cyanosis Neck : no JVD, no bruits Chest: scattered rales, normal resp effort Cardiac : Regular rhythm systolic ejection murmur, grade 3/6 systolic ejection murmur Abdomen: abdomen  soft  Peripheral Pulses: bilateral radial pulse(s) 2+ Extremities/Back: No deformities,clubbing, erythema  or edema observed Neurological: affect appropriate   __    Medications added today by the physician:    IMPRESSIONS:  1. Recent  hospitalization for acute on chronic combined congestive heart failure, currently  euvolemic. The last 2D echocardiogram done in April of 2019 showed an ejection fraction  of 40%.  2. Moderate to severe valvular disease with moderate to severe  mitral regurgitation, mild  aortic regurgitation and elevated pulmonary artery pressure at 56 mmHg. 08/2016.  a. Improved on mosr recent echo 07/2017: mild-mod MR, mild AI, DD 1, and reduced pulmonary   pressures.  3. Status post pacemaker  placement in November of 2017. Recent 2 D echo showed abnormal contractile  pattern secondary to RV pacemaker with midly reduced RV function.  4. Chronic kidney disease. Most recent creatinine 2.6.   5. Hypertension, stable.  6. Diabetes.   7. Anemia.  8. History of tobacco abuse.  9. History of hyponatremia.     RECOMMENDATIONS:  1. We will continue with the current medications.  2. We will plan for a one month congestive heart failure clinic follow up. The patient  should contact us sooner if he develops any signs or symptoms of heart failure prior to that follow up.    Karren Cobble, [place]    AB/tuld    cc: Donnel Saxon MD  ____________________________  Cleda Clarks  Diet_mgmt_edu,_guidance_and_counseling  TODAY  Heart Failure Clinic Visit 1 month  Single [place]. PPM w/o Reprogram 1 month

## 2020-07-08 NOTE — Progress Notes (Signed)
[facility] OFFICE  [id number] [facility]. [address] [place], [place] [address]     Marco Bruce [facility]    Date of Visit: 06/10/2015   Date of Birth: 05/13/1926  Age: 87 yrs.  Medical  Record Number: [id number]  __  CURRENT DIAGNOSES     1. 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 Diastolic (congestive) Heart Failure, I50.3  3. Abnormal electrocardiogram, R94.31  __  ALLERGIES     No Known Allergies  __  MEDICATIONS     1. atorvastatin 40 mg tablet, 1 po qd  2. bumetanide  1 mg tablet, 1 po bid  3. doxazosin 4 mg tablet, 1 po qhs  4. hydralazine 100 mg tablet, 1 po tid  5. insulin glargine 100 unit/mL subcutaneous solution, 8 units sq qhs  6. benzonatate 100 mg capsule, 1 po tid prn  7. isosorbide mononitrate  ER 120 mg tablet,extended release 24 hr, 1 po qd  8. pantoprazole 40 mg tablet,delayed release, 1 po bid  9. sitagliptin 50 mg tablet, 1 po qd  10. Procardia XL 60 mg tablet,extended release, daily    __   HISTORY OF PRESENT ILLNESS  Marco Bruce is an 87 year old gentleman who was seen in [facility] on February 21 for shortness of breath, orthopnea and lower extremity edema. He was noted to have mildly  elevated cardiac enzymes, stage 4 kidney disease, and hypertension. He had an echocardiogram that showed LVEF at 75%, severe mitral and tricuspid regurgitation, mild to moderate aortic regurgitation, and severe pulmonary hypertension. He was started on  anti-hypertensive medications. His ECG showed initially sinus rhythm which then went to a junctional rhythm and then he went into a second degree AV block, Mobitz type 1, Wenckebach. All of his AV node blocking medications were discontinued. He was seen  by Dr. Kate Sable of pulmonary medicine who assisted with anti-hypertensive medication along with diuresis. He comes in to the office today accompanied by his daughter and grandson. They are assisting with translation as Marco Bruce does not speak [place]. Currently  he is fighting a head cold. His blood pressure is  significantly elevated at 170/52. Marco Bruce ECG shows a second degree Mobitz 1 rhythm. Marco Bruce states that he is having no symptoms of lightheadedness or near syncope. He was also noted to have a low sodium  of 128, creatinine of 2.16 and a BUN of 44. He remains fluid overloaded with significant lower extremity edema and crackles in the bases of his lungs.  __  PAST HISTORY      Past Medical Illnesses: CHF, HTN, DM, CKD stage IV, HLD;  Past Cardiac Illnesses : No previous history of cardiac disease.; Infectious Diseases: No previous history of significant infectious diseases.;  Surgical Procedures: abdominal surgery; Trauma History: No previous history of significant trauma.;  Cardiology Procedures-Invasive: No previous interventional or invasive cardiology procedures.; Cardiology Procedures-Noninvasive : Echo 05/27/15; Left Ventricular Ejection Fraction: LVEF of 75% documented via echocardiogram on 05/27/2015;  Peripheral Vascular Procedures: Venous US 05/27/15  ___   FAMILY HISTORY  Father -- No relevant family history     __  CARDIAC RISK FACTORS     Tobacco Abuse: used to smoke, but quit;  Family History of Heart Disease: no family history of cardiovascular disease; Hyperlipidemia: positive;  Hypertension: positive;  Diabetes Mellitus: positive;  Prior History of Heart Disease: negative; Obesity: negative;  Sedentary Life Style:positive; [id number]  __   SOCIAL HISTORY    Alcohol Use: drinks occasionally and  beer;  Smoking: Does not smoke; Never smoker ([id number]); Diet: Regular diet;  Exercise: No regular exercise;   __  PHYSICAL EXAMINATION     Vital Signs:  Blood Pressure:  170/52 Sitting, Left arm, regular cuff  170/54 Sitting, Right arm,  regular cuff    Weight: 150.00 lbs.  Height: 60"   BMI: 29   Pulse: 59/min. Apical   Respirations:  20/min.       Constitutional: cooperative, in no acute distress  Skin: warm and dry to touch Head: normocephalic  Eyes: conjunctivae and lids normal ENT: No pallor or   cyanosis Neck: carotid pulses are full and equal bilaterally, no bruits  Chest: clear to auscultation bilaterally, rhonchi which clear with cough Cardiac: Regular rhythm systolic  ejection murmur, grade 3/6 systolic ejection murmur Abdomen: abdomen normal Peripheral Pulses : bilateral radial pulse(s) 2+ Extremities/Back: 2-3Z+ LE edema  Neurological: affect appropriate   __    Medications added today by the physician:   Adalat CC 60 mg tablet,extended release, 1 po bid, 60  Procardia XL 60 mg tablet,extended release, daily, 60    ECG: Second degree Mobitz 1 rhythm.     IMPRESSION:  1. Acute on chronic diastolic congestive heart failure, multi factorial. Echocardiogram showing   EF 75%, severe mitral and tricuspid regurgitation, mild to moderate aortic regurgitation  and   severe pulmonary hypertension, PAP 73 mmHg.   2. Sick sinus syndrome: elements of junctional rhythm and currently in a second degree AV   block, Mobitz type 1, Wenckebach.   3. Hypertension controlled.  4. Mild nonspecific Troponin  elevation (0.1) in the setting of diastolic heart failure.  5. CKD # 4.  6. Hyponatremia with current sodium level of 128.  7. Diabetes mellitus.  8. Anemia.  69. Former tobacco use, possible COPD.     RECOMMENDATION:   1. I spent at least 10 to 15 minutes of the office visit discussing possible pacemaker insertion.   I told them that because of his current rhythm we are limited to what anti-hypertensive    medications are available. The grandson did communicate this to Marco Bruce and he is not   interested.  2. I discontinued Amlodipine and will start Procardia 50 mg daily for elevated blood pressure.  3. I went over fluid restrictions to assist  with his low sodium.  4. Keep follow up appointment with Dr. Kate Sable next week.  5. Follow up with [place] in 3-4 weeks.  6. This case was discussed with Dr. Mendel Ryder and he agrees with the plan.  7. Overall Marco Bruce has multiple comorbidities and  with his advanced age would likely benefit  from   comfort care. This was not discussed with them at this office visit.    Micheline Chapman, ARNP    DB:lo    EL   ____________________________   TODAYS ORDERS  12 Lead ECG Today  Return Visit 15 MIN 1 month
